# Patient Record
Sex: Male | Born: 1940
Health system: Southern US, Community
[De-identification: ages and names within clinical notes are randomized; demographics above are authoritative.]

## PROBLEM LIST (undated history)

## (undated) DIAGNOSIS — J42 Unspecified chronic bronchitis: Secondary | ICD-10-CM

## (undated) DIAGNOSIS — R51 Headache: Secondary | ICD-10-CM

## (undated) DIAGNOSIS — K219 Gastro-esophageal reflux disease without esophagitis: Secondary | ICD-10-CM

## (undated) DIAGNOSIS — Z950 Presence of cardiac pacemaker: Secondary | ICD-10-CM

## (undated) DIAGNOSIS — Z9289 Personal history of other medical treatment: Secondary | ICD-10-CM

## (undated) DIAGNOSIS — R519 Headache, unspecified: Secondary | ICD-10-CM

## (undated) DIAGNOSIS — E785 Hyperlipidemia, unspecified: Secondary | ICD-10-CM

## (undated) DIAGNOSIS — G4730 Sleep apnea, unspecified: Secondary | ICD-10-CM

## (undated) DIAGNOSIS — M1990 Unspecified osteoarthritis, unspecified site: Secondary | ICD-10-CM

## (undated) DIAGNOSIS — I509 Heart failure, unspecified: Secondary | ICD-10-CM

## (undated) DIAGNOSIS — Z9109 Other allergy status, other than to drugs and biological substances: Secondary | ICD-10-CM

## (undated) DIAGNOSIS — E119 Type 2 diabetes mellitus without complications: Secondary | ICD-10-CM

## (undated) DIAGNOSIS — J189 Pneumonia, unspecified organism: Secondary | ICD-10-CM

## (undated) DIAGNOSIS — I447 Left bundle-branch block, unspecified: Secondary | ICD-10-CM

## (undated) DIAGNOSIS — I219 Acute myocardial infarction, unspecified: Secondary | ICD-10-CM

## (undated) DIAGNOSIS — I10 Essential (primary) hypertension: Secondary | ICD-10-CM

## (undated) DIAGNOSIS — J302 Other seasonal allergic rhinitis: Secondary | ICD-10-CM

## (undated) HISTORY — DX: Other seasonal allergic rhinitis: J30.2

## (undated) HISTORY — DX: Sleep apnea, unspecified: G47.30

## (undated) HISTORY — DX: Hyperlipidemia, unspecified: E78.5

## (undated) HISTORY — DX: Gastro-esophageal reflux disease without esophagitis: K21.9

## (undated) HISTORY — DX: Essential (primary) hypertension: I10

## (undated) HISTORY — PX: BACK SURGERY: SHX140

## (undated) HISTORY — DX: Left bundle-branch block, unspecified: I44.7

---
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
## 1982-12-13 HISTORY — PX: LUMBAR LAMINECTOMY: SHX95

## 1986-12-13 HISTORY — PX: INGUINAL HERNIA REPAIR: SUR1180

## 1997-12-13 DIAGNOSIS — I447 Left bundle-branch block, unspecified: Secondary | ICD-10-CM

## 1997-12-13 HISTORY — DX: Left bundle-branch block, unspecified: I44.7

## 1998-09-18 ENCOUNTER — Ambulatory Visit (HOSPITAL_COMMUNITY): Admission: RE | Admit: 1998-09-18 | Discharge: 1998-09-18 | Payer: Self-pay | Admitting: Interventional Cardiology

## 1998-09-25 ENCOUNTER — Ambulatory Visit (HOSPITAL_COMMUNITY): Admission: RE | Admit: 1998-09-25 | Discharge: 1998-09-25 | Payer: Self-pay | Admitting: Interventional Cardiology

## 2001-07-01 ENCOUNTER — Encounter: Payer: Self-pay | Admitting: Cardiology

## 2001-07-01 ENCOUNTER — Inpatient Hospital Stay (HOSPITAL_COMMUNITY): Admission: EM | Admit: 2001-07-01 | Discharge: 2001-07-03 | Payer: Self-pay | Admitting: Emergency Medicine

## 2001-12-13 HISTORY — PX: CARDIAC CATHETERIZATION: SHX172

## 2004-11-16 ENCOUNTER — Ambulatory Visit: Payer: Self-pay | Admitting: Internal Medicine

## 2004-11-30 ENCOUNTER — Ambulatory Visit: Payer: Self-pay | Admitting: Internal Medicine

## 2005-01-01 ENCOUNTER — Ambulatory Visit (HOSPITAL_COMMUNITY): Admission: RE | Admit: 2005-01-01 | Discharge: 2005-01-01 | Payer: Self-pay | Admitting: Allergy and Immunology

## 2005-02-03 ENCOUNTER — Ambulatory Visit: Payer: Self-pay | Admitting: Family Medicine

## 2005-11-12 ENCOUNTER — Ambulatory Visit: Payer: Self-pay | Admitting: Internal Medicine

## 2006-04-13 ENCOUNTER — Encounter: Payer: Self-pay | Admitting: Interventional Cardiology

## 2007-08-01 ENCOUNTER — Ambulatory Visit (HOSPITAL_COMMUNITY): Admission: RE | Admit: 2007-08-01 | Discharge: 2007-08-01 | Payer: Self-pay | Admitting: Internal Medicine

## 2007-08-01 ENCOUNTER — Telehealth (INDEPENDENT_AMBULATORY_CARE_PROVIDER_SITE_OTHER): Payer: Self-pay | Admitting: *Deleted

## 2007-11-29 ENCOUNTER — Encounter: Payer: Self-pay | Admitting: Internal Medicine

## 2008-04-01 ENCOUNTER — Telehealth (INDEPENDENT_AMBULATORY_CARE_PROVIDER_SITE_OTHER): Payer: Self-pay | Admitting: *Deleted

## 2008-04-02 ENCOUNTER — Emergency Department (HOSPITAL_COMMUNITY): Admission: EM | Admit: 2008-04-02 | Discharge: 2008-04-02 | Payer: Self-pay | Admitting: Family Medicine

## 2008-06-08 ENCOUNTER — Emergency Department (HOSPITAL_COMMUNITY): Admission: EM | Admit: 2008-06-08 | Discharge: 2008-06-08 | Payer: Self-pay | Admitting: Family Medicine

## 2008-07-08 ENCOUNTER — Ambulatory Visit: Payer: Self-pay | Admitting: Cardiovascular Disease

## 2008-07-08 ENCOUNTER — Ambulatory Visit (HOSPITAL_COMMUNITY): Admission: RE | Admit: 2008-07-08 | Discharge: 2008-07-08 | Payer: Self-pay | Admitting: Interventional Cardiology

## 2008-08-01 ENCOUNTER — Encounter: Payer: Self-pay | Admitting: Internal Medicine

## 2008-09-06 ENCOUNTER — Ambulatory Visit: Payer: Self-pay | Admitting: Internal Medicine

## 2008-09-06 DIAGNOSIS — J45909 Unspecified asthma, uncomplicated: Secondary | ICD-10-CM | POA: Insufficient documentation

## 2008-09-06 DIAGNOSIS — I2510 Atherosclerotic heart disease of native coronary artery without angina pectoris: Secondary | ICD-10-CM | POA: Insufficient documentation

## 2008-09-06 DIAGNOSIS — E785 Hyperlipidemia, unspecified: Secondary | ICD-10-CM | POA: Insufficient documentation

## 2008-09-06 DIAGNOSIS — E1169 Type 2 diabetes mellitus with other specified complication: Secondary | ICD-10-CM | POA: Insufficient documentation

## 2008-09-06 LAB — CONVERTED CEMR LAB
Bilirubin Urine: NEGATIVE
Blood in Urine, dipstick: NEGATIVE
Glucose, Urine, Semiquant: NEGATIVE
Ketones, urine, test strip: NEGATIVE
Nitrite: NEGATIVE
Protein, U semiquant: NEGATIVE
Specific Gravity, Urine: <1.005
Urobilinogen, UA: 0.2
WBC Urine, dipstick: NEGATIVE
pH: 6

## 2008-09-13 ENCOUNTER — Encounter: Admission: RE | Admit: 2008-09-13 | Discharge: 2008-09-13 | Payer: Self-pay | Admitting: Internal Medicine

## 2008-09-16 LAB — CONVERTED CEMR LAB
BUN: 19 mg/dL (ref 6–23)
Basophils Absolute: 0 10*3/uL (ref 0.0–0.1)
Basophils Relative: 0.8 % (ref 0.0–3.0)
CO2: 31 meq/L (ref 19–32)
Calcium: 9.4 mg/dL (ref 8.4–10.5)
Chloride: 106 meq/L (ref 96–112)
Creatinine, Ser: 1 mg/dL (ref 0.4–1.5)
Eosinophils Absolute: 0.2 10*3/uL (ref 0.0–0.7)
Eosinophils Relative: 3.5 % (ref 0.0–5.0)
GFR calc Af Amer: 96 mL/min
GFR calc non Af Amer: 80 mL/min
Glucose, Bld: 106 mg/dL — ABNORMAL HIGH (ref 70–99)
HCT: 42.6 % (ref 39.0–52.0)
Hemoglobin: 14.7 g/dL (ref 13.0–17.0)
Hgb A1c MFr Bld: 6.2 % — ABNORMAL HIGH (ref 4.6–6.0)
Lymphocytes Relative: 16.7 % (ref 12.0–46.0)
MCHC: 34.4 g/dL (ref 30.0–36.0)
MCV: 90.5 fL (ref 78.0–100.0)
Monocytes Absolute: 0.5 10*3/uL (ref 0.1–1.0)
Monocytes Relative: 8.6 % (ref 3.0–12.0)
Neutro Abs: 3.9 10*3/uL (ref 1.4–7.7)
Neutrophils Relative %: 70.4 % (ref 43.0–77.0)
PSA: 0.61 ng/mL (ref 0.10–4.00)
Platelets: 285 10*3/uL (ref 150–400)
Potassium: 4.6 meq/L (ref 3.5–5.1)
RBC: 4.71 M/uL (ref 4.22–5.81)
RDW: 12.8 % (ref 11.5–14.6)
Sodium: 141 meq/L (ref 135–145)
TSH: 1 microintl units/mL (ref 0.35–5.50)
WBC: 5.5 10*3/uL (ref 4.5–10.5)

## 2008-09-17 ENCOUNTER — Encounter (INDEPENDENT_AMBULATORY_CARE_PROVIDER_SITE_OTHER): Payer: Self-pay | Admitting: *Deleted

## 2009-01-16 ENCOUNTER — Telehealth: Payer: Self-pay | Admitting: Internal Medicine

## 2009-01-21 ENCOUNTER — Ambulatory Visit: Payer: Self-pay | Admitting: Internal Medicine

## 2009-01-27 ENCOUNTER — Ambulatory Visit: Payer: Self-pay | Admitting: Internal Medicine

## 2009-03-27 ENCOUNTER — Ambulatory Visit: Payer: Self-pay | Admitting: Internal Medicine

## 2009-03-30 LAB — CONVERTED CEMR LAB: Hgb A1c MFr Bld: 6.5 % (ref 4.6–6.5)

## 2009-03-31 ENCOUNTER — Encounter (INDEPENDENT_AMBULATORY_CARE_PROVIDER_SITE_OTHER): Payer: Self-pay | Admitting: *Deleted

## 2009-04-08 ENCOUNTER — Telehealth (INDEPENDENT_AMBULATORY_CARE_PROVIDER_SITE_OTHER): Payer: Self-pay | Admitting: *Deleted

## 2009-04-09 ENCOUNTER — Ambulatory Visit: Payer: Self-pay | Admitting: Internal Medicine

## 2009-04-09 DIAGNOSIS — E1159 Type 2 diabetes mellitus with other circulatory complications: Secondary | ICD-10-CM | POA: Insufficient documentation

## 2009-04-09 DIAGNOSIS — D692 Other nonthrombocytopenic purpura: Secondary | ICD-10-CM | POA: Insufficient documentation

## 2009-04-09 DIAGNOSIS — E1169 Type 2 diabetes mellitus with other specified complication: Secondary | ICD-10-CM | POA: Insufficient documentation

## 2009-04-30 ENCOUNTER — Telehealth (INDEPENDENT_AMBULATORY_CARE_PROVIDER_SITE_OTHER): Payer: Self-pay | Admitting: *Deleted

## 2009-05-01 ENCOUNTER — Ambulatory Visit: Payer: Self-pay | Admitting: Internal Medicine

## 2009-07-07 ENCOUNTER — Ambulatory Visit: Payer: Self-pay | Admitting: Internal Medicine

## 2009-07-07 ENCOUNTER — Telehealth (INDEPENDENT_AMBULATORY_CARE_PROVIDER_SITE_OTHER): Payer: Self-pay | Admitting: *Deleted

## 2009-07-07 LAB — CONVERTED CEMR LAB
Hgb A1c MFr Bld: 6.3 % (ref 4.6–6.5)
LDL Cholesterol: 74 mg/dL (ref 0–99)
VLDL: 14.8 mg/dL (ref 0.0–40.0)

## 2009-07-11 ENCOUNTER — Ambulatory Visit: Payer: Self-pay | Admitting: Internal Medicine

## 2009-10-09 ENCOUNTER — Telehealth: Payer: Self-pay | Admitting: Internal Medicine

## 2009-10-27 ENCOUNTER — Ambulatory Visit: Payer: Self-pay | Admitting: Internal Medicine

## 2009-10-27 DIAGNOSIS — J069 Acute upper respiratory infection, unspecified: Secondary | ICD-10-CM | POA: Insufficient documentation

## 2009-12-16 ENCOUNTER — Telehealth (INDEPENDENT_AMBULATORY_CARE_PROVIDER_SITE_OTHER): Payer: Self-pay | Admitting: *Deleted

## 2010-01-27 ENCOUNTER — Telehealth (INDEPENDENT_AMBULATORY_CARE_PROVIDER_SITE_OTHER): Payer: Self-pay | Admitting: *Deleted

## 2010-03-04 ENCOUNTER — Telehealth (INDEPENDENT_AMBULATORY_CARE_PROVIDER_SITE_OTHER): Payer: Self-pay | Admitting: *Deleted

## 2010-05-20 ENCOUNTER — Telehealth (INDEPENDENT_AMBULATORY_CARE_PROVIDER_SITE_OTHER): Payer: Self-pay | Admitting: *Deleted

## 2010-05-21 ENCOUNTER — Telehealth (INDEPENDENT_AMBULATORY_CARE_PROVIDER_SITE_OTHER): Payer: Self-pay | Admitting: *Deleted

## 2010-05-23 ENCOUNTER — Encounter: Payer: Self-pay | Admitting: Internal Medicine

## 2010-05-26 ENCOUNTER — Encounter: Payer: Self-pay | Admitting: Internal Medicine

## 2010-08-10 ENCOUNTER — Telehealth (INDEPENDENT_AMBULATORY_CARE_PROVIDER_SITE_OTHER): Payer: Self-pay | Admitting: *Deleted

## 2010-08-10 ENCOUNTER — Telehealth: Payer: Self-pay | Admitting: Internal Medicine

## 2010-10-23 ENCOUNTER — Telehealth (INDEPENDENT_AMBULATORY_CARE_PROVIDER_SITE_OTHER): Payer: Self-pay | Admitting: *Deleted

## 2010-11-19 ENCOUNTER — Ambulatory Visit: Payer: Self-pay | Admitting: Internal Medicine

## 2010-11-19 DIAGNOSIS — R21 Rash and other nonspecific skin eruption: Secondary | ICD-10-CM | POA: Insufficient documentation

## 2011-01-14 NOTE — Progress Notes (Signed)
Summary: REFILL  Phone Note Refill Request Message from:  Fax from [facility] on [facility] [address] FAX [phone]  Refills Requested: Medication #1:  ZOLPIDEM TARTRATE 5 MG TABS Take one tablet every 3rd night as needed. Initial call taken by: Barb Merino,  December 16, 2009 10:20 AM    Prescriptions: ZOLPIDEM TARTRATE 5 MG TABS (ZOLPIDEM TARTRATE) Take one tablet every 3rd night as needed.  #30 x 0   Entered by:   Shonna Chock   Authorized by:   Marga Melnick MD   Signed by:   Shonna Chock on 12/16/2009   Method used:   Printed then faxed to ...       [facility]  [phone]* (retail)       [address] [place], [place]  [address]       Ph: [phone] or [phone]       Fax: [phone]   RxID:   [id number]

## 2011-01-14 NOTE — Progress Notes (Signed)
Summary: refill  Phone Note Refill Request Message from:  Fax from Pharmacy on March 04, 2010 9:29 AM  Refills Requested: Medication #1:  ZOLPIDEM TARTRATE 5 MG TABS Take one tablet every 3rd night as needed. [facility] [address] fax [phone]   Method Requested: Fax to Local Pharmacy Next Appointment Scheduled: no appt Initial call taken by: Barb Merino,  March 04, 2010 9:30 AM    Prescriptions: ZOLPIDEM TARTRATE 5 MG TABS (ZOLPIDEM TARTRATE) Take one tablet every 3rd night as needed.  #30 x 0   Entered by:   Shonna Chock   Authorized by:   Marga Melnick MD   Signed by:   Shonna Chock on 03/04/2010   Method used:   Printed then faxed to ...       [facility]  [phone]* (retail)       [address] [place], [place]  [address]       Ph: [phone] or [phone]       Fax: [phone]   RxID:   [id number]

## 2011-01-14 NOTE — Assessment & Plan Note (Signed)
Summary: shingles/kn   Vital Signs:  Patient profile:   70 year old male Height:      69.25 inches (175.90 cm) Weight:      200 pounds (90.91 kg) BMI:     29.43 Temp:     98.1 degrees F (36.72 degrees C) oral Resp:     13 per minute BP sitting:   120 / 70  (left arm)  Vitals Entered By: Lucious Groves CMA (November 19, 2010 11:57 AM) CC: Possible return of shingles./kb, Rash Is Patient Diabetic? No Comments Patient notes that his sacral area is painful and this all began 2 days ago. He has itch, ache, and fatigue. He denies fever and spreading of the rash.   CC:  Possible return of shingles./kb and Rash.  History of Present Illness:     This is a 70 year old man who presents with  malaise & pain in L > R gluteal areas for 2&1/2 days.  The patient reports itching and tenderness, but denies pustules and blisters.  There are no exacerbating or releveing factors.  The patient denies the following symptoms: fever, abdominal pain, nausea, vomiting, diarrhea, dysuria, eye symptoms, and arthralgias.  Rx: none. PMH of  L 4 zoster . Symptoms now are similar . No Zoster immunization to date.  Current Medications (verified): 1)  Lisinopril-Hydrochlorothiazide 20-12.5 Mg Tabs (Lisinopril-Hydrochlorothiazide) .[name].. 1 By Mouth Once Daily 2)  Nexium 40 Mg Pack (Esomeprazole Magnesium) .[name].. 1 By Mouth Once Daily 3)  Crestor 5 Mg Tabs (Rosuvastatin Calcium) .[name].. 1 By Mouth At Bedtime 4)  Singulair 10 Mg Tabs (Montelukast Sodium) .[name].. 1 By Mouth Once Daily 5)  Bystolic 5 Mg Tabs (Nebivolol Hcl) .[name].. 1 By Mouth Once Daily 6)  Allegra 180 Mg Tabs (Fexofenadine Hcl) .[name].. 1 By Mouth Once Daily As Needed 7)  Adult Aspirin Low Strength 81 Mg Tbdp (Aspirin) .[name].. 1 By Mouth At Bedtime 8)  Symbicort 160-4.5 Mcg/act Aero (Budesonide-Formoterol Fumarate) .... 2 Puffs Two Times A Day 9)  Xopenex 1.25 Mg/34ml Nebu (Levalbuterol Hcl) .... Use in Nebulizer As Needed 10)  Coq10 100 Mg Caps (Coenzyme Q10) .... 2 By Mouth  Once Daily 11)  Daily Vitamins  Tabs (Multiple Vitamin) .[name].. 1 By Mouth Once Daily 12)  Proair Hfa 108 (90 Base) Mcg/act Aers (Albuterol Sulfate) .... As Needed 13)  Lorazepam 1 Mg Tabs (Lorazepam) .[name].. 1 By Mouth At Bedtime As Needed**appointment For Additional Refills** 14)  Levitra 20 Mg Tabs (Vardenafil Hcl) .... 1/2 -1 Once Daily As Needed (Can Not Be Taken With Ntg) 15)  Tramadol Hcl 50 Mg Tabs (Tramadol Hcl) .[name].. 1 By Mouth Once Daily As Needed 16)  Onetouch Test  Strp (Glucose Blood) .... Chech Blood Sugar As Directed Pt Checks  Bs 6 Times A Day 17)  Onetouch Lancets  Misc (Lancets) .... Check Bloodsugar As Directed Pt Checks His Bs 6 Times A Day 18)  Azithromycin 250 Mg Tabs (Azithromycin) .... As Per Pack 19)  Prednisone 20 Mg Tabs (Prednisone) .[name].. 1 Two Times A Day 20)  Tamiflu 75 Mg Caps (Oseltamivir Phosphate) .[name].. 1 By Mouth For 10 Days 21)  Nebulizer  Misc (Nebulizers) .... Portable Nebulizer**dx : Asthmatic Bronchitis  Allergies (verified): No Known Drug Allergies  Physical Exam  General:  in no acute distress; alert,appropriate and cooperative throughout examination Rectal:  external  hemorrhoidal tissue . No clinical Pilonidal cyst. Skin:  Faint increase in redness L > R medial gluteal areas w/o cellulitis. LS op scar  well healed Cervical Nodes:  No lymphadenopathy noted Axillary Nodes:  No palpable lymphadenopathy   Impression & Recommendations:  Problem # 1:  RASH-NONVESICULAR (ICD-782.1) S3 zoster clinically  Complete Medication List: 1)  Lisinopril-hydrochlorothiazide 20-12.5 Mg Tabs (Lisinopril-hydrochlorothiazide) .[name].. 1 by mouth once daily 2)  Nexium 40 Mg Pack (Esomeprazole magnesium) .[name].. 1 by mouth once daily 3)  Crestor 5 Mg Tabs (Rosuvastatin calcium) .[name].. 1 by mouth at bedtime 4)  Singulair 10 Mg Tabs (Montelukast sodium) .[name].. 1 by mouth once daily 5)  Bystolic 5 Mg Tabs (Nebivolol hcl) .[name].. 1 by mouth once daily 6)  Allegra 180 Mg Tabs  (Fexofenadine hcl) .[name].. 1 by mouth once daily as needed 7)  Adult Aspirin Low Strength 81 Mg Tbdp (Aspirin) .[name].. 1 by mouth at bedtime 8)  Symbicort 160-4.5 Mcg/act Aero (Budesonide-formoterol fumarate) .... 2 puffs two times a day 9)  Xopenex 1.25 Mg/70ml Nebu (Levalbuterol hcl) .... Use in nebulizer as needed 10)  Coq10 100 Mg Caps (Coenzyme q10) .... 2 by mouth once daily 11)  Daily Vitamins Tabs (Multiple vitamin) .[name].. 1 by mouth once daily 12)  Proair Hfa 108 (90 Base) Mcg/act Aers (Albuterol sulfate) .... As needed 13)  Lorazepam 1 Mg Tabs (Lorazepam) .[name].. 1 by mouth at bedtime as needed**appointment for additional refills** 14)  Levitra 20 Mg Tabs (Vardenafil hcl) .... 1/2 -1 once daily as needed (can not be taken with ntg) 15)  Tramadol Hcl 50 Mg Tabs (Tramadol hcl) .[name].. 1 by mouth once daily as needed 16)  Onetouch Test Strp (Glucose blood) .... Chech blood sugar as directed pt checks  bs 6 times a day 17)  Onetouch Lancets Misc (Lancets) .... Check bloodsugar as directed pt checks his bs 6 times a day 18)  Azithromycin 250 Mg Tabs (Azithromycin) .... As per pack 19)  Prednisone 20 Mg Tabs (Prednisone) .[name].. 1 two times a day 20)  Tamiflu 75 Mg Caps (Oseltamivir phosphate) .[name].. 1 by mouth for 10 days 21)  Nebulizer Misc (Nebulizers) .... Portable nebulizer**dx : asthmatic bronchitis 22)  Famciclovir 500 Mg Tabs (Famciclovir) .[name].. 1 three times a day 23)  Gabapentin 100 Mg Caps (Gabapentin) .[name].. 1 every 8 hrs as needed  Patient Instructions: 1)  titrate Gabapentin 100 mg up to 3 pills every 8 hrs as needed  over 3-4 days Prescriptions: GABAPENTIN 100 MG CAPS (GABAPENTIN) 1 every 8 hrs as needed  #30 x 1   Entered and Authorized by:   Marga Melnick MD   Signed by:   Marga Melnick MD on 11/19/2010   Method used:   Faxed to ...       [facility]* (retail)       [address]       [address] Shipping/mailing       [place], [place]  [address]       Ph:  [phone]       Fax: [phone]   RxID:   [id number] FAMCICLOVIR 500 MG TABS (FAMCICLOVIR) 1 three times a day  #21 x 0   Entered and Authorized by:   Marga Melnick MD   Signed by:   Marga Melnick MD on 11/19/2010   Method used:   Faxed to ...       [facility]* (retail)       [address]       [address] Shipping/mailing       [place], [place]  [address]  Ph: [phone]       Fax: [phone]   RxID:   [id number]    Orders Added: 1)  Est. Patient Level III [62952]

## 2011-01-14 NOTE — Progress Notes (Signed)
Summary: REFILL REQUEST  Phone Note Refill Request Call back at [phone] Message from:  Pharmacy on August 10, 2010 8:40 AM  Refills Requested: Medication #1:  LORAZEPAM 1 MG TABS 1 by mouth at bedtime as needed   Dosage confirmed as above?Dosage Confirmed   Supply Requested: 1 month   Last Ref[facility]: [facility]/09/[facility] [address]  Next Appointment Scheduled: NONE Initial call taken by: Lavell Islam,  August 10, 2010 8:40 AM    Prescriptions: LORAZEPAM 1 MG TABS (LORAZEPAM) 1 by mouth at bedtime as needed  #30 x 0   Entered by:   Shonna Chock CMA   Authorized by:   Loreen Freud DO   Signed by:   Shonna Chock CMA on 08/10/2010   Method used:   Printed then faxed to ...       [facility]  [phone]* (retail)       [address], [place]  [address]       Ph: [phone] or [phone]       Fax: [phone]   RxID:   [id number]

## 2011-01-14 NOTE — Progress Notes (Signed)
Summary: refill  Phone Note Refill Request Message from:  Fax from Pharmacy on October 23, 2010 8:52 AM  Refills Requested: Medication #1:  LORAZEPAM 1 MG TABS 1 by mouth at bedtime as needed [facility] [phone]  Initial call taken by: Okey Regal Spring,  October 23, 2010 9:15 AM    New/Updated Medications: LORAZEPAM 1 MG TABS (LORAZEPAM) 1 by mouth at bedtime as needed**APPOINTMENT FOR ADDITIONAL REFILLS** Prescriptions: LORAZEPAM 1 MG TABS (LORAZEPAM) 1 by mouth at bedtime as needed**APPOINTMENT FOR ADDITIONAL REFILLS**  #30 x 0   Entered by:   Shonna Chock CMA   Authorized by:   Marga Melnick MD   Signed by:   Shonna Chock CMA on 10/23/2010   Method used:   Printed then faxed to ...       [facility]  [phone]* (retail)       [address] [place], [place]  [address]       Ph: [phone] or [phone]       Fax: [phone]   RxID:   [id number]

## 2011-01-14 NOTE — Medication Information (Signed)
Summary: Prior Authorization & Denial for Zolpidem Tartrate/[facility]  Prior Authorization & Denial for Zolpidem Tartrate/[facility]   Imported By: Lanelle Bal 06/12/2010 15:33:42  _____________________________________________________________________  External Attachment:    Type:   Image     Comment:   External Document

## 2011-01-14 NOTE — Progress Notes (Signed)
Summary: rx for portable nebulizer  Phone Note Call from Patient Call back at Home Phone [phone]   Caller: Patient Summary of Call: patient wants rx for portable nebulizer -faxed to [facility] - fax [phone] -he has a nebulizer but it is heavy for travel   Initial call taken by: Okey Regal Spring,  August 10, 2010 1:51 PM  Follow-up for Phone Call        Dr.Hopper please advise on instruction for neblizer use Follow-up by: Shonna Chock CMA,  August 10, 2010 2:02 PM  Additional Follow-up for Phone Call Additional follow up Details #1::        OK ;Dx : asthmatic bronchitis. Rx meds if needed for 90 days , RX1 Additional Follow-up by: Marga Melnick MD,  August 10, 2010 4:14 PM    Additional Follow-up for Phone Call Additional follow up Details #2::    pt aware rx faxed [phone], pt states that he does not currently need any med now............Marland KitchenFelecia Deloach CMA  August 10, 2010 4:33 PM   New/Updated Medications: NEBULIZER  MISC (NEBULIZERS) Portable Nebulizer NEBULIZER  MISC (NEBULIZERS) Portable Nebulizer**Dx : asthmatic bronchitis Prescriptions: NEBULIZER  MISC (NEBULIZERS) Portable Nebulizer**Dx : asthmatic bronchitis  #1 x 0   Entered by:   Jeremy Johann CMA   Authorized by:   Marga Melnick MD   Signed by:   Jeremy Johann CMA on 08/10/2010   Method used:   Printed then faxed to ...       [facility]  [phone]* (retail)       [address] [place], [place]  [address]       Ph: [phone] or [phone]       Fax: [phone]   RxID:   [id number]

## 2011-01-14 NOTE — Progress Notes (Signed)
Summary: Speak with MD about flu exposure  Phone Note Call from Patient Call back at Home Phone [phone]   Caller: Patient Reason for Call: Talk to Doctor Summary of Call: Patinet has been expose to the flu this past weekend and wants to know what preventative measures he should take. He did not want to speak to the nurse, just to you.  Initial call taken by: Harold Barban,  January 27, 2010 1:29 PM  Follow-up for Phone Call        Tamiflu is only option if exposure witin 48 hrs ; I can Rx if he wishes (75 mg qdX10 days). Only other options would be Zicam, Echinacea, vit C 200 mg once daily & strict handwashing Follow-up by: Marga Melnick MD,  January 27, 2010 2:04 PM  Additional Follow-up for Phone Call Additional follow up Details #1::        Spoke with pt, given Dr Caryl Never recommendation re flu, pt has not been exposed yet but will be on friday, pt would like rx if pt is not still contagious will not use, [facility] .Kandice Hams  January 27, 2010 2:34 PM  Additional Follow-up by: Kandice Hams,  January 27, 2010 2:35 PM    New/Updated Medications: TAMIFLU 75 MG CAPS (OSELTAMIVIR PHOSPHATE) 1 by mouth for 10 days Prescriptions: TAMIFLU 75 MG CAPS (OSELTAMIVIR PHOSPHATE) 1 by mouth for 10 days  #10 x 0   Entered by:   Kandice Hams   Authorized by:   Marga Melnick MD   Signed by:   Kandice Hams on 01/27/2010   Method used:   Faxed to ...       [facility]  [phone]* (retail)       [address] [place], [place]  [address]       Ph: [phone] or [phone]       Fax: [phone]   RxID:   [id number]

## 2011-01-14 NOTE — Progress Notes (Signed)
Summary: Prior Auth in process ZOLPIDEM//[facility]  Phone Note Refill Request Message from:  Pharmacy on May 20, 2010 12:30 PM  Refills Requested: Medication #1:  ZOLPIDEM TARTRATE 5 MG TABS Take one tablet every 3rd night as needed.   Dosage confirmed as above?Dosage Confirmed [facility] [phone] ID#: [id number]  Initial call taken by: Harold Barban,  May 20, 2010 12:30 PM  Follow-up for Phone Call        PRIOR AUTH IN PROCESS ZOLPIDEM  CASE QI#[id number] .Kandice Hams  May 21, 2010 4:20 PM  Follow-up by: Kandice Hams,  May 21, 2010 4:20 PM  Additional Follow-up for Phone Call Additional follow up Details #1::        zolpidem denied stating that medication will be covered if being treated for chronic persistent insomnia and require medication nightly based on information patient doesn't meet criteria.....Marland KitchenMarland KitchenDoristine Devoid  May 29, 2010 4:52 PM   Hop do you wish to submitt appeal if so I have paperwork to be completed and faxed back .....Marland KitchenMarland KitchenDoristine Devoid  May 29, 2010 4:54 PM     Additional Follow-up for Phone Call Additional follow up Details #2::    this makes no sense; it ia "Catch 22".  Ask him if he wants to use Lorazepam 1 mg at bedtime as needed . I can Rx #30 as needed as directed  Follow-up by: Marga Melnick MD,  May 29, 2010 5:10 PM  Additional Follow-up for Phone Call Additional follow up Details #3:: Details for Additional Follow-up Action Taken: I spoke with patient and patient is ok with trying Lorazepam and would like [facility]. Additional Follow-up by: Shonna Chock,  June 09, 2010 8:18 AM  New/Updated Medications: LORAZEPAM 1 MG TABS (LORAZEPAM) 1 by mouth at bedtime as needed Prescriptions: LORAZEPAM 1 MG TABS (LORAZEPAM) 1 by mouth at bedtime as needed  #30 x 0   Entered by:   Shonna Chock   Authorized by:   Marga Melnick MD   Signed by:   Shonna Chock on 06/09/2010   Method used:   Printed then faxed to  ...       [facility]  [phone]* (retail)       [address] [place], [place]  [address]       Ph: [phone] or [phone]       Fax: [phone]   RxID:   [id number]

## 2011-01-14 NOTE — Progress Notes (Signed)
Summary: NEED DX FOR PRIO AUTHR.Marland KitchenHOP SEE PLEASE  Phone Note From Other Clinic   Caller: [facility] Summary of Call: HOP THIS PT NEEDS PRIOR AUTH FOR HIS ZOLPIDEM,  WHAT IS PT DIAGNOSIS LOOKED IN PROB LIST NO MENTION OF SX--PLEASE ADVISE Initial call taken by: Kandice Hams,  May 21, 2010 4:52 PM  Follow-up for Phone Call        Insomnia due to Shift work Follow-up by: Marga Melnick MD,  May 21, 2010 5:52 PM

## 2011-01-18 ENCOUNTER — Telehealth (INDEPENDENT_AMBULATORY_CARE_PROVIDER_SITE_OTHER): Payer: Self-pay | Admitting: *Deleted

## 2011-01-28 NOTE — Progress Notes (Signed)
Summary: Refill Request  Phone Note Refill Request Call back at [phone] Message from:  Pharmacy on January 18, 2011 9:01 AM  Refills Requested: Medication #1:  LORAZEPAM 1 MG TABS 1 by mouth at bedtime as needed**APPOINTMENT FOR ADDITIONAL REFILLS**   Dosage confirmed as above?Dosage Confirmed   Supply Requested: 30   [facility] [address]  Next Appointment Scheduled: none Initial call taken by: Harold Barban,  January 18, 2011 9:02 AM    New/Updated Medications: LORAZEPAM 1 MG TABS (LORAZEPAM) 1 by mouth at bedtime as needed Prescriptions: LORAZEPAM 1 MG TABS (LORAZEPAM) 1 by mouth at bedtime as needed  #30 x 0   Entered by:   Shonna Chock CMA   Authorized by:   Marga Melnick MD   Signed by:   Shonna Chock CMA on 01/18/2011   Method used:   Printed then faxed to ...       [facility]  [phone]* (retail)       [address] [place], [place]  [address]       Ph: [phone] or [phone]       Fax: [phone]   RxID:   [id number]

## 2011-02-01 ENCOUNTER — Telehealth (INDEPENDENT_AMBULATORY_CARE_PROVIDER_SITE_OTHER): Payer: Self-pay | Admitting: *Deleted

## 2011-02-09 NOTE — Progress Notes (Signed)
Summary: refill  Phone Note Refill Request Message from:  Fax from Pharmacy on February 01, 2011 2:13 PM  Refills Requested: Medication #1:  LEVITRA 20 MG TABS 1/2 -1 once daily as needed (CAN NOT BE TAKEN WITH NTG) [facility] [phone]   Initial call taken by: Okey Regal Spring,  February 01, 2011 2:14 PM  Follow-up for Phone Call        Last OV was an acute, previous appointment was 2010.  Patient needs to schedule CPX appointment  Follow-up by: Shonna Chock CMA,  February 01, 2011 2:47 PM    New/Updated Medications: LEVITRA 20 MG TABS (VARDENAFIL HCL) 1/2 -1 once daily as needed (CAN NOT BE TAKEN WITH NTG) **APPOINTMENT DUE** Prescriptions: LEVITRA 20 MG TABS (VARDENAFIL HCL) 1/2 -1 once daily as needed (CAN NOT BE TAKEN WITH NTG) **APPOINTMENT DUE**  #6 x 0   Entered by:   Shonna Chock CMA   Authorized by:   Marga Melnick MD   Signed by:   Shonna Chock CMA on 02/01/2011   Method used:   Electronically to        [facility]  [phone]* (retail)       [address] [place], [place]  [address]       Ph: [phone] or [phone]       Fax: [phone]   RxID:   [id number]

## 2011-03-15 ENCOUNTER — Encounter: Payer: Self-pay | Admitting: Family Medicine

## 2011-03-15 ENCOUNTER — Ambulatory Visit (INDEPENDENT_AMBULATORY_CARE_PROVIDER_SITE_OTHER): Payer: 59 | Admitting: Family Medicine

## 2011-03-15 DIAGNOSIS — J069 Acute upper respiratory infection, unspecified: Secondary | ICD-10-CM

## 2011-03-15 DIAGNOSIS — J45909 Unspecified asthma, uncomplicated: Secondary | ICD-10-CM

## 2011-03-15 MED ORDER — PREDNISONE 20 MG PO TABS: 20.0000 mg | ORAL_TABLET | Freq: Two times a day (BID) | ORAL | Status: DC

## 2011-03-15 MED ORDER — CHLORPHENIRAMINE-HYDROCODONE 8-10 MG/5ML PO LQCR: 5.0000 mL | Freq: Two times a day (BID) | ORAL | Status: AC | PRN

## 2011-03-15 MED ORDER — MOXIFLOXACIN HCL 400 MG PO TABS: 400.0000 mg | ORAL_TABLET | Freq: Every day | ORAL | Status: AC

## 2011-03-15 NOTE — Progress Notes (Signed)
  Subjective:    Patient ID: Jeffery [name], male    DOB: Jun 21, 1941, 70 y.o.   MRN: [id number]  HPI Cough- sxs started 4-5 days ago w/ 'classic allergy rhinitis' and 'went straight to the lungs'.  Hx of asthma.  Cough is productive of 'yellow green'.  + fevers.  + sick contacts.  Using Xopenex, Singulair, Symbicort.  Denies ear pain, sore throat, facial pain, nasal congestion.  Works as Designer, industrial/product at [employer]    Review of Systems For ROS see HPI     Objective:   Physical Exam  Constitutional: He appears well-developed and well-nourished. No distress.  HENT:  Head: Normocephalic and atraumatic.  Right Ear: Tympanic membrane normal.  Left Ear: Tympanic membrane normal.  Nose: No mucosal edema or rhinorrhea. Right sinus exhibits no maxillary sinus tenderness and no frontal sinus tenderness. Left sinus exhibits no maxillary sinus tenderness and no frontal sinus tenderness.  Mouth/Throat: No oropharyngeal exudate, posterior oropharyngeal edema or posterior oropharyngeal erythema.  Eyes: Conjunctivae and EOM are normal. Pupils are equal, round, and reactive to light.  Neck: Normal range of motion. Neck supple.  Cardiovascular: Normal rate, regular rhythm and normal heart sounds.   Pulmonary/Chest: He has wheezes.       Strong, hacking cough Severe, diffuse wheezing w/ prolonged expiratory phase Coarse BS RLL  Lymphadenopathy:    He has no cervical adenopathy.          Assessment & Plan:

## 2011-03-15 NOTE — Patient Instructions (Signed)
Take the Avelox daily as directed for the bronchitis- take w/ food to avoid upset stomach Use the Albuterol or Xopenex as needed for cough/wheezing Start the Prednisone- 2 pills daily (at same time, not 2x/day).  Take w/ food. Tylenol for fever Drink plenty of fluids Tussionex as needed for cough Call with any questions or concerns Hang in there!!!

## 2011-03-16 NOTE — Assessment & Plan Note (Signed)
URI causing exacerbation.  Improved air movement w/ neb in the office.  Start steroids to improve oxygenation.  Continue inhaler use.  Reviewed supportive care and red flags that should prompt return.  Pt expressed understanding and is in agreement w/ plan.

## 2011-03-16 NOTE — Assessment & Plan Note (Signed)
Pt w/ obvious infxn- given hx of lung dz and exposure to sick contacts will start Avelox.  Cough meds prn.  Reviewed supportive care and red flags that should prompt return.  Pt expressed understanding and is in agreement w/ plan.

## 2011-04-21 ENCOUNTER — Other Ambulatory Visit: Payer: Self-pay | Admitting: *Deleted

## 2011-04-21 MED ORDER — LORAZEPAM 1 MG PO TABS: 1.0000 mg | ORAL_TABLET | Freq: Every evening | ORAL | Status: DC | PRN

## 2011-04-27 NOTE — Assessment & Plan Note (Signed)
[facility]                                 ON-CALL NOTE   NAME:Mimnaugh, Delshawn                     MRN:          [id number]  DATE:01/21/2009                            DOB:          08-23-1941    PHONE NUMBER:  [phone].   PRIMARY CARE PHYSICIAN:  Titus Dubin. Alwyn Ren, MD,FACP, FCCP   SUBJECTIVE:  The patient calls regarding an asthma exacerbation that  began earlier in the week.  He has been seen by his primary care who  recommended an extended course of prednisone as well as having treated  him with a course of azithromycin.  He states that his wheezing and  shortness of breath is much improved, but he does continue to have a  cough.  He denies fever and has not had any change in color of sputum.  He is calling because he was told that if he was not any better to start  Avelox, but he is not sure if he  should or if he should hold off.   ASSESSMENT AND PLAN:  Asthma exacerbation.  I feel that he likely has  been adequately treated with azithromycin, given no fever, as well as no  change in his sputum and improvement in symptoms at this point.  I  counseled him to hold off on using Avelox and consider followup with his  regular doctor.  If he feels that he is worsening in the next 24 hours,  he does have Avelox that he may start.     Kerby Nora, MD  Electronically Signed    AB/[facility]  DD: 01/26/2009  DT: 01/27/2009  Job #: [id number]

## 2011-04-28 ENCOUNTER — Other Ambulatory Visit: Payer: Self-pay | Admitting: *Deleted

## 2011-04-28 MED ORDER — VARDENAFIL HCL 20 MG PO TABS: 20.0000 mg | ORAL_TABLET | ORAL | Status: DC

## 2011-04-28 NOTE — Telephone Encounter (Signed)
Patient needs to schedule a CPX  

## 2011-04-30 NOTE — Cardiovascular Report (Signed)
[place]  Patient:    Jeffery [name], Jeffery [name]                     MRN: [id number] Proc. Date: 07/03/01 Adm. Date:  [id number] Attending:  Lyn Records. 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  He has a history of chronic left bundle-branch block and previous functional studies that have at times suggested a low EF in the upper 40s.  PROCEDURES PERFORMED: 1. Left heart catheterization. 2. Selective coronary angiography. 3. Left ventriculography.  DESCRIPTION OF PROCEDURE:  After informed consent, a 6 French sheath was placed via the right femoral artery using a modified Seldinger technique. A 6 French A2 multipurpose catheter was used for hemodynamic recordings, left ventriculography and selective left coronary angiography.  A 6 French #4 right Judkins catheter was used for right coronary angiography.  The patient tolerated the procedure without complications.  RESULTS:  I:  HEMODYNAMIC DATA:     a. The aortic pressure 139/83 mmHg.     b. Left ventricular pressure 139/22 mmHg.  II:  LEFT VENTRICULOGRAPHY:  The left ventricular cavity size is mildly dilated.  Overall contractility is normal.  The estimated ejection fraction is 60% and the calculated ejection fraction is 49%.  No mitral regurgitation is noted.  III:  SELECTIVE CORONARY ANGIOGRAPHY:     a. Left main:  The left main coronary artery has no calcification.        The left main is short and is normal.     b. Left anterior descending coronary artery:  The LAD is large.  In the        initial RAO view there is evidence of an ostial 20% LAD narrowing.        Several small diagonal branches arise from this vessel.  No significant        obstruction is noted in the LAD or its branches.     c. Circumflex artery:  The circumflex coronary artery is moderate in size        and  gives two obtuse marginals and the first obtuse marginal contains        an eccentric 50-70% stenosis just distal to the bifurcation and second        obtuse marginal contains an eccentric 30% stenosis just beyond the        bifurcation from the circumflex.     d. Right coronary artery:  The right coronary artery is a dominant vessel        containing mid vessel luminal irregularities but no significant        obstruction.  CONCLUSIONS: 1. Mild to moderate coronary disease with predominant involvement in the    first and second obtuse marginals as noted above. 2. Overall normal left ventricular function. 3. Chronic left bundle-branch block.  RECOMMENDATIONS:  Aggressive antilipid therapy and medical therapy. There is no current indication for percutaneous intervention.  I will also recommend an aspirin per day. DD:  07/03/01 TD:  07/03/01 Job: [id number]

## 2011-10-18 ENCOUNTER — Other Ambulatory Visit: Payer: Self-pay | Admitting: Internal Medicine

## 2011-10-18 MED ORDER — LORAZEPAM 1 MG PO TABS: 1.0000 mg | ORAL_TABLET | Freq: Every evening | ORAL | Status: DC | PRN

## 2011-10-18 NOTE — Telephone Encounter (Signed)
Hop's patient please advise     KPP

## 2011-10-18 NOTE — Telephone Encounter (Signed)
Patient needs to schedule a CPX  

## 2011-12-08 ENCOUNTER — Other Ambulatory Visit: Payer: Self-pay | Admitting: Internal Medicine

## 2011-12-08 MED ORDER — VARDENAFIL HCL 20 MG PO TABS: 20.0000 mg | ORAL_TABLET | ORAL | Status: DC

## 2011-12-08 NOTE — Telephone Encounter (Signed)
RX sent, patient needs to schedule a yearly appointment (CPX) with Dr.Hopper

## 2011-12-10 ENCOUNTER — Ambulatory Visit (INDEPENDENT_AMBULATORY_CARE_PROVIDER_SITE_OTHER): Payer: 59

## 2011-12-10 DIAGNOSIS — R059 Cough, unspecified: Secondary | ICD-10-CM

## 2011-12-10 DIAGNOSIS — B9789 Other viral agents as the cause of diseases classified elsewhere: Secondary | ICD-10-CM

## 2011-12-10 DIAGNOSIS — R05 Cough: Secondary | ICD-10-CM

## 2011-12-25 ENCOUNTER — Ambulatory Visit (INDEPENDENT_AMBULATORY_CARE_PROVIDER_SITE_OTHER): Payer: 59

## 2011-12-25 DIAGNOSIS — R059 Cough, unspecified: Secondary | ICD-10-CM

## 2011-12-25 DIAGNOSIS — R05 Cough: Secondary | ICD-10-CM

## 2012-02-01 ENCOUNTER — Telehealth: Payer: Self-pay | Admitting: Internal Medicine

## 2012-02-01 MED ORDER — LORAZEPAM 1 MG PO TABS: 1.0000 mg | ORAL_TABLET | Freq: Every evening | ORAL | Status: DC | PRN

## 2012-02-01 NOTE — Telephone Encounter (Signed)
Last OV 03/15/11, pending appointment 03/27/12 RX sent

## 2012-02-01 NOTE — Telephone Encounter (Signed)
Refill- lorazepam 1mg  tablet. Take 1 tablet at bedtime as needed.  Pharmacy comments: needs office visit.

## 2012-02-09 ENCOUNTER — Ambulatory Visit (INDEPENDENT_AMBULATORY_CARE_PROVIDER_SITE_OTHER): Payer: 59 | Admitting: Internal Medicine

## 2012-02-09 ENCOUNTER — Encounter: Payer: Self-pay | Admitting: Internal Medicine

## 2012-02-09 DIAGNOSIS — J209 Acute bronchitis, unspecified: Secondary | ICD-10-CM

## 2012-02-09 DIAGNOSIS — J45909 Unspecified asthma, uncomplicated: Secondary | ICD-10-CM

## 2012-02-09 MED ORDER — AZITHROMYCIN 250 MG PO TABS: ORAL_TABLET | ORAL | Status: AC

## 2012-02-09 MED ORDER — PREDNISONE 20 MG PO TABS: 20.0000 mg | ORAL_TABLET | Freq: Two times a day (BID) | ORAL | Status: DC

## 2012-02-09 MED ORDER — VARDENAFIL HCL 20 MG PO TABS: 20.0000 mg | ORAL_TABLET | ORAL | Status: DC

## 2012-02-09 NOTE — Progress Notes (Signed)
  Subjective:    Patient ID: Jeffery [name], male    DOB: [birth year], 71 y.o.   MRN: [id number]  HPI Respiratory tract infection Onset/symptoms:02/06/12 as mild ST Exposures (illness/environmental/extrinsic):no Progression of symptoms:to chest congestion Treatments/response:fluids, rest/ no response Present symptoms: Fever/chills/sweats:no Frontal headache:no Facial pain:no Nasal purulence:no, clear Sore throat:resolved Dental pain:no Lymphadenopathy:no Wheezing/shortness of breath:some wheezing, Xoponex helped significantly. He uses albuterol simply as a rescue. He is on Symbicort 2 puffs twice a day  & Singulair Cough/sputum/hemoptysis:productive as of 2/26 Pleuritic pain:no Associated extrinsic/allergic symptoms:itchy eyes/ sneezing:yes initially Past medical history: Seasonal allergiesyes : /asthma:yes from age 75 Smoking history:quit 1965          Review of Systems Refill needed for Levitra; he knows not to take this medication with nitroglycerin. He states that the nitroglycerin is quite as bad before he uses any of it.     Objective:   Physical Exam Gen.: Healthy and well-nourished in appearance. Alert, appropriate and cooperative throughout exam. Eyes: No corneal or conjunctival inflammation noted.  Ears: External  ear exam reveals no significant lesions or deformities. Canals clear .TMs normal. Hearing is grossly normal bilaterally. Nose: External nasal exam reveals no deformity or inflammation. Nasal mucosa are pink and moist. No lesions or exudates noted. Septum dislocated to R Mouth: Oral mucosa and oropharynx reveal no lesions or exudates. Teeth in good repair. Lungs: Normal respiratory effort; chest expands symmetrically. He has diffuse, homogenous, low-grade wheezing in all fields without increased work of breathing. Dry raspy cough Heart: Normal rate and rhythm. Normal S1 and S2. No gallop, click, or rub. S4 with slurring at  LSB ; no murmur. Heart sounds are  distant and heard best in the epigastric/ lower sternal area .                                                            Musculoskeletal/extremities: No clubbing, cyanosis, edema, or deformity noted.  Nail health  good. Neurologic: Alert and oriented x3.  Skin: Intact without suspicious lesions or rashes. Lymph: No cervical, axillary lymphadenopathy present. Psych: Mood and affect are normal. Normally interactive                                                                                         Assessment & Plan:  #1 upper respiratory infection without rhinositis  # bronchitis , acute with active  Bronchospasm despite maintenance Singulair and Symbicort and when necessary Xopenex  Plan: See orders and recommendations

## 2012-02-09 NOTE — Patient Instructions (Signed)
Plain Mucinex for thick secretions ;force NON dairy fluids . Use a Neti pot daily as needed for sinus congestion. Nasal cleansing in the shower as discussed. Make sure that all residual soap is removed to prevent irritation.  

## 2012-02-10 ENCOUNTER — Telehealth: Payer: Self-pay | Admitting: *Deleted

## 2012-02-10 MED ORDER — GUAIFENESIN-CODEINE 100-10 MG/5ML PO SYRP: ORAL_SOLUTION | ORAL | Status: DC

## 2012-02-10 NOTE — Telephone Encounter (Signed)
Ok for Cheratussin 1-2 tsps Q6 prn for cough.  #240, no refills.

## 2012-02-10 NOTE — Telephone Encounter (Signed)
[facility] Triage Call Report Triage Record Num: [id number] Operator: Jeraldine Loots Patient Name: Jeffery [name] Call Date & Time: 02/10/2012 9:58:18AM Patient Phone: [phone] PCP: Marga Melnick Patient Gender: Male PCP Fax : [phone] Patient DOB: [birth year] Practice Name: [facility] Reason for Call: Caller: Jeffery [name]/Patient; PCP: Marga Melnick; CB#: [phone]; Was seen in the office on Wednesday but forgot to ask for a cough medication. States that he was up most of the night coughing. Using the new meds and his asthma meds. Uses [facility] on [address]. Protocol(s) Used: Office Note Recommended Outcome per Protocol: Information Noted and Sent to Office Reason for Outcome: Caller information to office Care Advice:

## 2012-02-10 NOTE — Telephone Encounter (Signed)
Please advise if ok to RX cough med for patient, last OV 02/09/2012 rx'ed z-pak and prednisone. NKDA

## 2012-02-29 ENCOUNTER — Ambulatory Visit (INDEPENDENT_AMBULATORY_CARE_PROVIDER_SITE_OTHER): Payer: 59 | Admitting: Internal Medicine

## 2012-02-29 ENCOUNTER — Encounter: Payer: Self-pay | Admitting: Internal Medicine

## 2012-02-29 VITALS — BP 114/76 | HR 78 | Temp 98.5°F | Wt 208.0 lb

## 2012-02-29 DIAGNOSIS — J45901 Unspecified asthma with (acute) exacerbation: Secondary | ICD-10-CM

## 2012-02-29 MED ORDER — BECLOMETHASONE DIPROPIONATE 80 MCG/ACT IN AERS: 1.0000 | INHALATION_SPRAY | Freq: Two times a day (BID) | RESPIRATORY_TRACT | Status: DC

## 2012-02-29 MED ORDER — PREDNISONE 20 MG PO TABS: 20.0000 mg | ORAL_TABLET | Freq: Two times a day (BID) | ORAL | Status: DC

## 2012-02-29 NOTE — Patient Instructions (Signed)
QVAR one - 2 inhalations every 12 hours; gargle and spit after use .Order for x-rays entered into  the computer; these will be performed at [address] [facility]. across from [facility]. No appointment is necessary.

## 2012-02-29 NOTE — Progress Notes (Signed)
  Subjective:    Patient ID: Jeffery [name], male    DOB: 04-28-1941, 71 y.o.   MRN: [id number]  HPI He did have protracted symptoms after last office visit despite the prednisone and antibiotics. Since that time he has had variable cough and wheezing, but he does feel significantly better today. Symptoms had been worse at night despite using his Symbicort 2 puffs twice a day & also  Allegra and Singulair daily. He's not been using his rescue inhaler.  He denies fever, chills, sweats, or purulent secretions. He has been producing plugs or airway casts intermittently. He denies extrinsic symptoms of watery itchy eyes and sneezing    Review of Systems   He also denies postnasal drainage. He denies any significant dyspepsia or reflux symptoms. He is on Bystolic  as well as an ACE inhibitor but he does not relate his symptomatology to these medicines.     Objective:   Physical Exam General appearance:good health ;well nourished; no acute distress or increased work of breathing is present.  No  lymphadenopathy about the head, neck, or axilla noted.   Eyes: No conjunctival inflammation or lid edema is present.   Ears:  External ear exam shows no significant lesions or deformities.  Otoscopic examination reveals clear canals, tympanic membranes are intact bilaterally without bulging, retraction, inflammation or discharge.  Nose:  External nasal examination shows no deformity or inflammation. Nasal mucosa are pink and moist without lesions or exudates. No septal dislocation or deviation.No obstruction to airflow.   Oral exam: Dental hygiene is good; lips and gums are healthy appearing.There is no oropharyngeal erythema or exudate noted.     Heart:  Normal rate and regular rhythm. S1 and S2 normal without gallop, murmur, click, rub or other extra sounds.   Lungs: Diffuse, low grade musical rhonchi and wheezing in all lung fields. Auscultatory changes are most prominent over the anterior chest.No  increased work of breathing.    Extremities:  No cyanosis, edema, or clubbing  noted    Skin: Warm & dry        Assessment & Plan:  #1 chronic, active asthma without apparent infectious or extrinsic triggers.  Plan: The albuterol should be used every 4 hours as needed. Supplemental inhaled steroids in addition to the Symbicort and a steroid wean would be recommended. A chest x-ray is necessary to rule out atypical infection.

## 2012-03-02 ENCOUNTER — Ambulatory Visit (INDEPENDENT_AMBULATORY_CARE_PROVIDER_SITE_OTHER)
Admission: RE | Admit: 2012-03-02 | Discharge: 2012-03-02 | Disposition: A | Discharge status: Home / Self Care | Payer: 59 | Source: Ambulatory Visit | Attending: Internal Medicine | Admitting: Internal Medicine

## 2012-03-02 DIAGNOSIS — J45901 Unspecified asthma with (acute) exacerbation: Secondary | ICD-10-CM

## 2012-03-07 ENCOUNTER — Telehealth: Payer: Self-pay | Admitting: *Deleted

## 2012-03-07 NOTE — Telephone Encounter (Signed)
Pt would like to know if it would be possible for Dr Alwyn Ren to take over filling all his med including the ones that cardiologist usually fill. Pt notes that he would like to reduce the amount of physician he is seeing.Please advise

## 2012-03-07 NOTE — Telephone Encounter (Signed)
Left message to call office

## 2012-03-07 NOTE — Telephone Encounter (Signed)
This is fine as long as the medications do not include agents such as Plavix, Pradaxa, Xarelto,etc ( "blood thinners")

## 2012-03-21 NOTE — Telephone Encounter (Signed)
Discuss with patient  

## 2012-03-27 ENCOUNTER — Ambulatory Visit (INDEPENDENT_AMBULATORY_CARE_PROVIDER_SITE_OTHER): Payer: 59 | Admitting: Internal Medicine

## 2012-03-27 ENCOUNTER — Encounter: Payer: Self-pay | Admitting: Internal Medicine

## 2012-03-27 VITALS — BP 114/70 | HR 80 | Temp 98.3°F | Resp 14 | Ht 69.03 in | Wt 207.8 lb

## 2012-03-27 DIAGNOSIS — J45909 Unspecified asthma, uncomplicated: Secondary | ICD-10-CM

## 2012-03-27 DIAGNOSIS — E119 Type 2 diabetes mellitus without complications: Secondary | ICD-10-CM

## 2012-03-27 DIAGNOSIS — J398 Other specified diseases of upper respiratory tract: Secondary | ICD-10-CM

## 2012-03-27 DIAGNOSIS — J988 Other specified respiratory disorders: Secondary | ICD-10-CM

## 2012-03-27 MED ORDER — LORAZEPAM 1 MG PO TABS: 1.0000 mg | ORAL_TABLET | Freq: Every evening | ORAL | Status: DC | PRN

## 2012-03-27 MED ORDER — HYDROCODONE-ACETAMINOPHEN 7.5-500 MG PO TABS: 1.0000 | ORAL_TABLET | ORAL | Status: AC | PRN

## 2012-03-27 NOTE — Progress Notes (Signed)
Pt could not use the bathroom so took urine cup home.[name] He will drop it off one day this week.

## 2012-03-27 NOTE — Progress Notes (Signed)
Addended byPecola Lawless on: 03/27/2012 03:49 PM   Modules accepted: Orders

## 2012-03-27 NOTE — Progress Notes (Signed)
Subjective:    Patient ID: Jeffery [name], male    DOB: 09-Sep-1941, 71 y.o.   MRN: [id number]  HPI Jeffery [name]  is here for a physical;acute issues include LBP       Review of Systems   He's had variable character/ intensity left lumbosacral area pain which exacerabted over 10 days with his recent respiratory tract infection with protracted coughing. Flexeril was not significantly helpful but narcotic pain medications were.Rarely this pain radiates to the right hip. It has been  worse with activities such as yard work.This is actually an acute on chronic problem over the years. He has had no associated dysuria, pyuria, or hematuria. He denies unexplained weight loss, abdominal pain, melena, or rectal bleeding. He has not had a colonoscopy; he states it has been put off as he would require a stress test prior to completion of the study. Standard of  care discussed    Objective:   Physical Exam Gen.: Healthy and well-nourished in appearance. Alert, appropriate and cooperative throughout exam. Head: Normocephalic without obvious abnormalities;  no alopecia  Eyes: No corneal or conjunctival inflammation noted. Pupils equal round reactive to light and accommodation. Fundal exam is benign without hemorrhages, exudate, papilledema. Extraocular motion intact. Vision grossly normal with lenses. Ears: External  ear exam reveals no significant lesions or deformities. Canals clear .TMs normal. Hearing is grossly normal bilaterally. Nose: External nasal exam reveals no deformity or inflammation. Nasal mucosa are pink and moist. No lesions or exudates noted.  Mouth: Oral mucosa and oropharynx reveal no lesions or exudates. Teeth in good repair. Neck: No deformities, masses, or tenderness noted. Range of motion & Thyroid normal Lungs: Normal respiratory effort; chest expands symmetrically. Lungs are clear to auscultation without rales, wheezes, or increased work of breathing. Heart: Normal rate and  rhythm. Normal S1 and S2. No gallop, click, or rub. S4 with slurring ; no murmur. Abdomen: Bowel sounds normal; abdomen soft and nontender. No masses, organomegaly. Ventral hernia noted. Genitalia/ DRE: Varices and small granuloma noted on the left.Prostate is normal without enlargement, asymmetry, nodularity, or induration.  Musculoskeletal/extremities: No deformity or scoliosis noted of  the thoracic or lumbar spine; but there is mild asymmetry of the paraspinous muscles. Right thoracic muscles are greater than left. No clubbing, cyanosis, edema, or deformity noted. Range of motion  normal .Tone & strength  normal.Joints normal. Nail health  good. He is able to lie flat and sit up without help. Straight leg raising is negative bilaterally Vascular: Carotid, radial artery, dorsalis pedis and  posterior tibial pulses are full and equal. No bruits present. Neurologic: Alert and oriented x3. Deep tendon reflexes symmetrical and normal . Gait, including heel and toe walking, is normal       Skin: Intact without suspicious lesions or rashes. Keratotic lesion is suggested in the right popliteal space. Lymph: No cervical, axillary, or inguinal lymphadenopathy present. Psych: Mood and affect are normal. Normally interactive                                                                                         Assessment & Plan:  #1 comprehensive physical exam; no acute  findings #2 see Problem List with Assessments & Recommendations  #3 acute on chronic low back discomfort; no neuromuscular deficit present  #4 over due for colonoscopic screening; he should obtain clearance from his Cardiologist if this is necessary and proceed with an elective procedure. Plan: see Orders

## 2012-03-27 NOTE — Patient Instructions (Signed)
Preventive Health Care: Exercise at least 30-45 minutes a day,  3-4 days a week.  Eat a low-fat diet with lots of fruits and vegetables, up to 7-9 servings per day. Consume less than 40 grams of sugar per day from foods & drinks with High Fructose Corn Sugar as # 1,2,3 or # 4 on label. As per the Standard of Care , screening Colonoscopy recommended @ 50 & every 5-10 years thereafter . More frequent monitor would be dictated by family history or findings @ Colonoscopy . Please contact Dr. Katrinka Blazing for cardiac clearance, if needed, for a screening colonoscopy. The best exercises for the low back include freestyle swimming, stretch aerobics, and yoga. Please call if you do want to be referred to physical therapy.

## 2012-03-28 LAB — HEMOGLOBIN A1C: Hgb A1c MFr Bld: 7 % — ABNORMAL HIGH (ref 4.6–6.5)

## 2012-06-27 LAB — MICROALBUMIN / CREATININE URINE RATIO: Microalb Creat Ratio: 0.5 mg/g (ref 0.0–30.0)

## 2012-06-27 NOTE — Addendum Note (Signed)
Addended by: Silvio Pate D on: 06/27/2012 11:29 AM   Modules accepted: Orders

## 2012-06-27 NOTE — Addendum Note (Signed)
Addended by: Silvio Pate D on: 06/27/2012 11:26 AM   Modules accepted: Orders

## 2012-08-02 ENCOUNTER — Telehealth: Payer: Self-pay | Admitting: Internal Medicine

## 2012-08-02 NOTE — Telephone Encounter (Signed)
Refill: Hydrocodone/acetaminophen 7.5-500 mg tab. Last fill 03-27-12

## 2012-08-03 NOTE — Telephone Encounter (Signed)
Please advise, Hydrocodone is not on medication list

## 2012-08-03 NOTE — Telephone Encounter (Signed)
Please verify original Rxing  MD & diagnosis as not on med list

## 2012-08-03 NOTE — Telephone Encounter (Signed)
Spoke with patient, patient states Dr.Hopper originally rx'ed Hydrocodone, he last ordered in April 2013 . Patient states he is not Taking Tramadol at all (currently on medication list). Patient states he takes for back problems.  Dr.Hopper gave the verbal to refill medication

## 2012-08-04 MED ORDER — HYDROCODONE-ACETAMINOPHEN 7.5-500 MG PO TABS: 1.0000 | ORAL_TABLET | ORAL | Status: DC | PRN

## 2012-08-04 NOTE — Telephone Encounter (Signed)
RX called in .

## 2012-08-30 ENCOUNTER — Encounter: Payer: Self-pay | Admitting: Internal Medicine

## 2012-08-30 ENCOUNTER — Ambulatory Visit (INDEPENDENT_AMBULATORY_CARE_PROVIDER_SITE_OTHER): Payer: Medicare Other | Admitting: Internal Medicine

## 2012-08-30 VITALS — BP 110/70 | HR 65 | Temp 97.7°F | Wt 198.7 lb

## 2012-08-30 DIAGNOSIS — N529 Male erectile dysfunction, unspecified: Secondary | ICD-10-CM

## 2012-08-30 DIAGNOSIS — R5383 Other fatigue: Secondary | ICD-10-CM

## 2012-08-30 DIAGNOSIS — Z23 Encounter for immunization: Secondary | ICD-10-CM

## 2012-08-30 DIAGNOSIS — E119 Type 2 diabetes mellitus without complications: Secondary | ICD-10-CM

## 2012-08-30 DIAGNOSIS — R5381 Other malaise: Secondary | ICD-10-CM

## 2012-08-30 DIAGNOSIS — R6882 Decreased libido: Secondary | ICD-10-CM

## 2012-08-30 NOTE — Progress Notes (Signed)
  Subjective:    Patient ID: Jeffery [name], male    DOB: Mar 05, 1941, 71 y.o.   MRN: [id number]  HPI He describes fatigue to the point he is asleep by 9 PM. He feels he's lost overall muscle strength and muscle mass. He has had a 20 pound weight loss in the context of an organic diet and exercise program. He walks 2 miles per day and is active in yard work. He also does some weight work.    Review of Systems There is some issue with getting an erection and keeping same. He notes some decreased libido.  The last PSA on record is 0.61 on 09/06/08. Digital rectal exam was negative 03/27/12  His Cardiologist, Dr Katrinka Blazing, monitors  his lipids & hepatic function. His fasting blood sugar range 90-105. 2 hour postprandial sugars have been less than 130. The A1c was 7% on 03/27/12.    Objective:   Physical Exam  Gen.: Healthy and well-nourished in appearance. Alert, appropriate and cooperative throughout exam.   Eyes: No corneal or conjunctival inflammation noted. No lid lag or proptosis.  Extraocular motion intact. Vision grossly normal. Neck: No deformities, masses, or tenderness noted. Range of motion & Thyroid normal. Lungs: Normal respiratory effort; chest expands symmetrically. Lungs are clear to auscultation without rales, wheezes, or increased work of breathing. Heart:Distant heart sounds. Normal rate and rhythm. Normal S1 and S2. No gallop, click, or rub. S4 w/o murmur.  Abdomen: Bowel sounds normal; abdomen soft and nontender. No masses, organomegaly or hernias noted. Musculoskeletal/extremities: No clubbing, cyanosis, edema, or deformity noted. Nail health  good. Vascular: Carotid, radial artery, dorsalis pedis and  posterior tibial pulses are full and equal. No bruits present. Neurologic: Alert and oriented x3. Deep tendon reflexes symmetrical and normal.          Skin: Intact without suspicious lesions or rashes. Lymph: No cervical, axillary lymphadenopathy present. Psych: Mood and affect  are normal. Normally interactive                                                                                         Assessment & Plan:  #1 weakness and fatigue  #2 diabetes; home readings suggest excellent control  #3 erectile dysfunction  #4 decreased libido  Plan: Screening testosterone @ 8  a.m. with TSH,CBC & dif,BMET and A1c

## 2012-08-30 NOTE — Patient Instructions (Addendum)
Please  schedule fasting Labs : BMET, CBC & dif, TSH,A1c, PSA, screening Testosterone (NOT free Testosterone). PLEASE BRING THESE INSTRUCTIONS TO FOLLOW UP  LAB APPOINTMENT.This will guarantee correct labs are drawn, eliminating need for repeat blood sampling ( needle sticks ! ). Diagnoses /Codes: 780.79,250.00,799.81,607.84  If you activate My Chart; the results can be released to you as soon as they populate from the lab. If you choose not to use this program; the labs have to be reviewed, copied & mailed   causing a delay in getting the results to you.

## 2012-08-31 ENCOUNTER — Other Ambulatory Visit (INDEPENDENT_AMBULATORY_CARE_PROVIDER_SITE_OTHER): Payer: Medicare Other

## 2012-08-31 DIAGNOSIS — E119 Type 2 diabetes mellitus without complications: Secondary | ICD-10-CM

## 2012-08-31 DIAGNOSIS — R5381 Other malaise: Secondary | ICD-10-CM

## 2012-08-31 DIAGNOSIS — R6882 Decreased libido: Secondary | ICD-10-CM

## 2012-08-31 DIAGNOSIS — R5383 Other fatigue: Secondary | ICD-10-CM

## 2012-08-31 DIAGNOSIS — Z125 Encounter for screening for malignant neoplasm of prostate: Secondary | ICD-10-CM

## 2012-08-31 LAB — CBC WITH DIFFERENTIAL/PLATELET
Basophils Absolute: 0 10*3/uL (ref 0.0–0.1)
Basophils Relative: 0.3 % (ref 0.0–3.0)
Eosinophils Absolute: 0.2 10*3/uL (ref 0.0–0.7)
Lymphocytes Relative: 14.1 % (ref 12.0–46.0)
MCHC: 33.3 g/dL (ref 30.0–36.0)
MCV: 88.4 fl (ref 78.0–100.0)
Monocytes Absolute: 0.5 10*3/uL (ref 0.1–1.0)
Neutrophils Relative %: 73.3 % (ref 43.0–77.0)
Platelets: 249 10*3/uL (ref 150.0–400.0)
RDW: 14.6 % (ref 11.5–14.6)

## 2012-08-31 LAB — BASIC METABOLIC PANEL
BUN: 22 mg/dL (ref 6–23)
CO2: 29 mEq/L (ref 19–32)
Calcium: 9 mg/dL (ref 8.4–10.5)
Chloride: 103 mEq/L (ref 96–112)
Creatinine, Ser: 1.2 mg/dL (ref 0.4–1.5)
Glucose, Bld: 121 mg/dL — ABNORMAL HIGH (ref 70–99)

## 2012-08-31 LAB — HEMOGLOBIN A1C: Hgb A1c MFr Bld: 6.4 % (ref 4.6–6.5)

## 2012-08-31 LAB — TSH: TSH: 1.21 u[IU]/mL (ref 0.35–5.50)

## 2012-08-31 LAB — PSA: PSA: 0.79 ng/mL (ref 0.10–4.00)

## 2012-08-31 NOTE — Progress Notes (Signed)
Labs only

## 2012-09-02 ENCOUNTER — Encounter: Payer: Self-pay | Admitting: Internal Medicine

## 2012-09-04 ENCOUNTER — Encounter: Payer: Self-pay | Admitting: Internal Medicine

## 2012-09-06 ENCOUNTER — Other Ambulatory Visit (INDEPENDENT_AMBULATORY_CARE_PROVIDER_SITE_OTHER): Payer: Medicare Other

## 2012-09-06 DIAGNOSIS — R7989 Other specified abnormal findings of blood chemistry: Secondary | ICD-10-CM

## 2012-09-06 DIAGNOSIS — E291 Testicular hypofunction: Secondary | ICD-10-CM

## 2012-09-07 LAB — TESTOSTERONE, FREE, TOTAL, SHBG: Sex Hormone Binding: 31 nmol/L (ref 13–71)

## 2012-09-12 ENCOUNTER — Encounter: Payer: Self-pay | Admitting: Internal Medicine

## 2012-09-18 ENCOUNTER — Other Ambulatory Visit: Payer: Self-pay | Admitting: Internal Medicine

## 2012-09-18 ENCOUNTER — Encounter: Payer: Self-pay | Admitting: Internal Medicine

## 2012-09-18 ENCOUNTER — Other Ambulatory Visit: Payer: Self-pay

## 2012-09-18 DIAGNOSIS — E349 Endocrine disorder, unspecified: Secondary | ICD-10-CM

## 2012-09-18 MED ORDER — TESTOSTERONE 20.25 MG/1.25GM (1.62%) TD GEL: 5.0000 g | TRANSDERMAL | Status: DC

## 2012-11-03 ENCOUNTER — Telehealth: Payer: Self-pay | Admitting: Internal Medicine

## 2012-11-03 MED ORDER — LORAZEPAM 1 MG PO TABS: 1.0000 mg | ORAL_TABLET | Freq: Every evening | ORAL | Status: DC | PRN

## 2012-11-03 MED ORDER — HYDROCODONE-ACETAMINOPHEN 7.5-500 MG PO TABS: 1.0000 | ORAL_TABLET | ORAL | Status: DC | PRN

## 2012-11-03 NOTE — Telephone Encounter (Signed)
Refill: Hydroco/apap 7.5-500 tab. Last fill 08-04-12

## 2012-11-03 NOTE — Telephone Encounter (Signed)
Refill: Lorazepam 1 mg tab. Last fill 09-02-12

## 2012-11-03 NOTE — Telephone Encounter (Signed)
RX's called in  

## 2012-11-06 ENCOUNTER — Encounter: Payer: Self-pay | Admitting: Internal Medicine

## 2012-11-15 ENCOUNTER — Ambulatory Visit (INDEPENDENT_AMBULATORY_CARE_PROVIDER_SITE_OTHER): Payer: Medicare Other | Admitting: Family Medicine

## 2012-11-15 VITALS — BP 135/76 | HR 78 | Temp 98.9°F | Resp 16 | Ht 69.0 in | Wt 206.0 lb

## 2012-11-15 DIAGNOSIS — J45901 Unspecified asthma with (acute) exacerbation: Secondary | ICD-10-CM

## 2012-11-15 DIAGNOSIS — E119 Type 2 diabetes mellitus without complications: Secondary | ICD-10-CM

## 2012-11-15 DIAGNOSIS — J209 Acute bronchitis, unspecified: Secondary | ICD-10-CM

## 2012-11-15 MED ORDER — PREDNISONE 20 MG PO TABS: ORAL_TABLET | ORAL | Status: DC

## 2012-11-15 MED ORDER — HYDROCOD POLST-CHLORPHEN POLST 10-8 MG/5ML PO LQCR: 5.0000 mL | Freq: Two times a day (BID) | ORAL | Status: DC | PRN

## 2012-11-15 MED ORDER — AZITHROMYCIN 250 MG PO TABS: ORAL_TABLET | ORAL | Status: DC

## 2012-11-15 NOTE — Patient Instructions (Addendum)
1. Acute bronchitis  azithromycin (ZITHROMAX Z-PAK) 250 MG tablet, chlorpheniramine-HYDROcodone (TUSSIONEX PENNKINETIC ER) 10-8 MG/5ML LQCR  2. Asthma exacerbation  predniSONE (DELTASONE) 20 MG tablet

## 2012-11-15 NOTE — Progress Notes (Signed)
[address]   [place], [place]  [address]   [phone]  Subjective:    Patient ID: Jeffery [name], male    DOB: 11/17/1941, 71 y.o.   MRN: [id number]  HPIThis 71 y.o. male presents for evaluation of cough, wheezing.  Onset of URI from grandchild; onset five days.  Starts in head and always ends up in chest.  Onset of asthma since age 50.  End expiratory 69% at baseline.  Coughed all night; large amount of mucous from chest.  Low grade fever here in office; no fevers at home.  Xopenex q 6 for past few days.  Dulera compliance.  Normal asthma drugs.  Mucinex every 12 hours.  No fever/chills/sweats.  Mild HA.  No ear pain; +ST from PND, coughing.  +rhinorrhea and nasal congestion but now improved.  +horrible cough with sputum production.  No SOB.  +wheezing.  No n/v/d.  S/p flu vaccine at PCP office.  History of pneumonia in past; usually runs fever>100 and develops SOB, chest pain; does not feel that badly today.  Prefers to avoid quinolones if at all possible.  No recent antibiotics.  Usually suffers with two asthma exacerbations per year.  Last Xopenex use last night; due for nebulizer; prefers to administer at home.  Does not check peak flows at home; best peak flow in past 480.   Review of Systems  Constitutional: Negative for fever, chills, diaphoresis and fatigue.  HENT: Positive for congestion, sore throat, rhinorrhea and postnasal drip. Negative for ear pain, sneezing and sinus pressure.   Respiratory: Positive for cough and wheezing. Negative for shortness of breath.   Gastrointestinal: Negative for nausea, vomiting and diarrhea.  Skin: Negative for rash.    Past Medical History  Diagnosis Date  . Asthma   . CAD (coronary artery disease)   . LBBB (left bundle branch block) 1999  . GERD (gastroesophageal reflux disease)   . Seasonal allergies   . HTN (hypertension)   . Hyperlipidemia     Past Surgical History  Procedure Date  . Cardiac catheterization 2003    Dr Garnette Scheuermann;  85 % R circumflex obstruction  . Inguinal hernia repair     right, age 71  . Inguinal hernia repair     left age 33  . Lumbar laminectomy     age 20  . Tonsillectomy     age 85    Prior to Admission medications   Medication Sig Start Date End Date Taking? Authorizing Provider  aspirin 81 MG tablet Take 81 mg by mouth at bedtime.     Yes Historical Provider, MD  beclomethasone (QVAR) 40 MCG/ACT inhaler Inhale 2 puffs into the lungs 2 (two) times daily.   Yes Historical Provider, MD  Coenzyme Q10 (COQ10) 100 MG CAPS Take 200 mg by mouth daily.     Yes Historical Provider, MD  fexofenadine (ALLEGRA) 180 MG tablet Take 180 mg by mouth daily as needed.     Yes Historical Provider, MD  glucose blood (ONE TOUCH TEST STRIPS) test strip 1 each by Other route as needed. Check blood sugar 6 times per day    Yes Historical Provider, MD  HYDROcodone-acetaminophen (LORTAB) 7.5-500 MG per tablet Take 1 tablet by mouth every 4 (four) hours as needed. 11/03/12  Yes Pecola Lawless, MD  levalbuterol Pauline Aus) 1.25 MG/3ML nebulizer solution Take 1 ampule by nebulization as needed. Use in nebulizer prn    Yes Historical Provider, MD  lisinopril-hydrochlorothiazide (PRINZIDE,ZESTORETIC) 20-12.5 MG per tablet  Take 1 tablet by mouth daily.     Yes Historical Provider, MD  LORazepam (ATIVAN) 1 MG tablet Take 1 tablet (1 mg total) by mouth at bedtime as needed. 11/03/12  Yes Pecola Lawless, MD  Mometasone Furo-Formoterol Fum (DULERA) 200-5 MCG/ACT AERO Inhale 2 puffs into the lungs 2 (two) times daily.   Yes Historical Provider, MD  montelukast (SINGULAIR) 10 MG tablet Take 10 mg by mouth daily.     Yes Historical Provider, MD  Multiple Vitamin (MULTIVITAMIN) tablet Take 1 tablet by mouth daily.     Yes Historical Provider, MD  nebivolol (BYSTOLIC) 5 MG tablet Take 5 mg by mouth daily.     Yes Historical Provider, MD  ONE TOUCH LANCETS MISC by Does not apply route as directed. Check blood sugar 6 time per day     Yes Historical Provider, MD  rosuvastatin (CRESTOR) 5 MG tablet Take 5 mg by mouth. 1 by mouth three times week (M/W/F)   Yes Historical Provider, MD  albuterol (PROAIR HFA) 108 (90 BASE) MCG/ACT inhaler Inhale 2 puffs into the lungs as needed.      Historical Provider, MD  azithromycin (ZITHROMAX Z-PAK) 250 MG tablet Two tablets daily x 5 days 11/15/12   Ethelda Chick, MD  chlorpheniramine-HYDROcodone [facility] PENNKINETIC ER) 10-8 MG/5ML LQCR Take 5 mLs by mouth every 12 (twelve) hours as needed (cough). 11/15/12   Ethelda Chick, MD  esomeprazole (NEXIUM) 40 MG capsule Take 40 mg by mouth daily.      Historical Provider, MD  predniSONE (DELTASONE) 20 MG tablet Three tablets daily x 1 day then two tablets daily x 4 days, then one tablet daily x 5 days 11/15/12   Ethelda Chick, MD  Testosterone (ANDROGEL) 20.25 MG/1.25GM (1.62%) GEL Place 5 g onto the skin 1 day or 1 dose. 09/18/12   Pecola Lawless, MD  vardenafil (LEVITRA) 20 MG tablet Take 1 tablet (20 mg total) by mouth as directed. 0.5-1 once dail prn, cannot be taken with NTG 02/09/12   Pecola Lawless, MD    No Known Allergies  History   Social History  . Marital Status: Married    Spouse Name: N/A    Number of Children: N/A  . Years of Education: N/A   Occupational History  . Not on file.   Social History Main Topics  . Smoking status: Former Smoker    Quit date: 12/14/1963  . Smokeless tobacco: Not on file  . Alcohol Use: 1.2 oz/week    2 Glasses of wine per week     Comment: socially  . Drug Use: No  . Sexually Active:    Other Topics Concern  . Not on file   Social History Narrative  . No narrative on file    Family History  Problem Relation Age of Onset  . Heart attack Father     48s  . Heart failure Mother     90s  . Subarachnoid hemorrhage Mother 14  . Hypertension Mother   . Subarachnoid hemorrhage Paternal Grandfather 36  . Diabetes Maternal Aunt        Objective:   Physical Exam  Nursing note  and vitals reviewed. Constitutional: He appears well-developed and well-nourished. No distress.  HENT:  Head: Normocephalic and atraumatic.  Right Ear: External ear normal.  Left Ear: External ear normal.  Nose: Nose normal.  Mouth/Throat: Oropharynx is clear and moist.  Eyes: Conjunctivae normal and EOM are normal. Pupils are equal, round,  and reactive to light.  Neck: Normal range of motion. Neck supple. No thyromegaly present.  Cardiovascular: Normal rate, regular rhythm and normal heart sounds.  Exam reveals no gallop and no friction rub.   No murmur heard. Pulmonary/Chest: Effort normal. No respiratory distress. He has wheezes in the right upper field, the right middle field, the left upper field and the left middle field. He has no rhonchi. He has no rales.       +inspiratory and expiratory wheezing anterior lung fields B; +wheezing inspiratory and expiratory RUL, LUL.  Moderate air movement.  No tachypnea.  No asseccory muscle use.  No nasal flaring.  Musculoskeletal: He exhibits no edema.  Lymphadenopathy:    He has no cervical adenopathy.  Skin: He is not diaphoretic.   PEAK FLOW:  350    Assessment & Plan:   1. Acute bronchitis  azithromycin (ZITHROMAX Z-PAK) 250 MG tablet, chlorpheniramine-HYDROcodone (TUSSIONEX PENNKINETIC ER) 10-8 MG/5ML LQCR  2. Asthma exacerbation  predniSONE (DELTASONE) 20 MG tablet     1. Acute Bronchitis:  New.  Rx for Zithromax provided; also rx for Tussionex provided.  Continue Mucinex bid.  RTC for development of fever, shortness of breath. 2.  Asthma Exacerbation: Mild to moderate.  Rx for Prednisone provided; continue Xopenex every 6-8 hours. RTC for acute worsening. 3. DMII: controlled; monitor sugars closely with Prednisone therapy.

## 2012-11-18 NOTE — Progress Notes (Signed)
Reviewed and agree.

## 2013-01-06 ENCOUNTER — Other Ambulatory Visit: Payer: Self-pay | Admitting: Internal Medicine

## 2013-02-23 ENCOUNTER — Telehealth: Payer: Self-pay | Admitting: Internal Medicine

## 2013-02-23 MED ORDER — LORAZEPAM 1 MG PO TABS: 1.0000 mg | ORAL_TABLET | Freq: Every evening | ORAL | Status: DC | PRN

## 2013-02-23 NOTE — Telephone Encounter (Signed)
Last OV 08/2012, Patient aware Controlled Substance Contract to be sign and rx to be picked up

## 2013-02-23 NOTE — Telephone Encounter (Signed)
refill lorazepam 1mg  tab #30 take one tablet by mouth nightly at bedtime last fill 1.13.14

## 2013-02-27 ENCOUNTER — Encounter: Payer: Self-pay | Admitting: Internal Medicine

## 2013-02-27 ENCOUNTER — Ambulatory Visit (INDEPENDENT_AMBULATORY_CARE_PROVIDER_SITE_OTHER): Payer: Medicare Other | Admitting: Internal Medicine

## 2013-02-27 VITALS — BP 138/80 | HR 70 | Wt 209.0 lb

## 2013-02-27 DIAGNOSIS — K219 Gastro-esophageal reflux disease without esophagitis: Secondary | ICD-10-CM

## 2013-02-27 DIAGNOSIS — J45909 Unspecified asthma, uncomplicated: Secondary | ICD-10-CM

## 2013-02-27 DIAGNOSIS — G5602 Carpal tunnel syndrome, left upper limb: Secondary | ICD-10-CM

## 2013-02-27 DIAGNOSIS — E119 Type 2 diabetes mellitus without complications: Secondary | ICD-10-CM

## 2013-02-27 DIAGNOSIS — G5600 Carpal tunnel syndrome, unspecified upper limb: Secondary | ICD-10-CM

## 2013-02-27 MED ORDER — ESOMEPRAZOLE MAGNESIUM 40 MG PO CPDR: 40.0000 mg | DELAYED_RELEASE_CAPSULE | Freq: Every day | ORAL | Status: DC

## 2013-02-27 MED ORDER — GLUCOSE BLOOD VI STRP: ORAL_STRIP | Status: DC

## 2013-02-27 NOTE — Progress Notes (Signed)
  Subjective:    Patient ID: Jeffery [name], male    DOB: 07-29-1941, 72 y.o.   MRN: [id number]  HPI  He had been on Nexium for reflux symptoms manifested as constant clearing of the throat with intermittent globus sensation. This  also helped control his asthma. Despite this his present allergies do not feel comfortable continuing this prescription and recommended he obtain a maintenance prescription from the gastroenterologist.   He has had some numbness in his hands for 2-3 years but this is exacerbated in the last several months. It is worse overnight and can be  associated with some discomfort as high as the upper forearms. L hand involvement > R.     Review of Systems  He denies significant dyspepsia, dysphagia, abdominal pain, unexplained weight loss, melena, or rectal bleeding. TSH was normal in 9/13       Objective:   Physical Exam    General appearance is one of good health and nourishment w/o distress.  Eyes: No conjunctival inflammation or scleral icterus is present.  Neck: Thyroid normal to palpation without enlargement or nodules  Oral exam: Dental hygiene is good; lips and gums are healthy appearing.There is no oropharyngeal erythema or exudate noted.   Heart:  Normal rate and regular rhythm. S1 and S2 normal without gallop, murmur, click, rub or other extra sounds     Lungs:Chest clear to auscultation; no wheezes, rhonchi,rales ,or rubs present.No increased work of breathing.   Abdomen: bowel sounds normal, soft and non-tender without masses, organomegaly or hernias noted.  No guarding or rebound   Skin:Warm & dry.  Intact without suspicious lesions or rashes ; no jaundice   Lymphatic: No lymphadenopathy is noted about the head, neck, axilla .  Neuro: Normal deep tendon reflexes. Tinel's sign positive left wrist          Assessment & Plan:   #1 asthma, controlled  #2 reflux associated with globus and throat clearing. This has contributed to the  asthma. This has been controlled completely by Nexium.  #3 carpal tunnel syndrome, left upper extremity greater than right  Plan: Nexium will be filled. He may need to negotiate with his  insurance company as to coverage  Wrist splint will be prescribed overnight for  the left hand. If symptoms fail to improve EMG/nerve conduction studies would be indicated. TSH will be repeated.

## 2013-02-27 NOTE — Patient Instructions (Addendum)
Go to Web M.D. for information on Carpal Tunnel Syndrome. If symptoms persist or progress despite the wrist splint; nerve conduction/EMG studies would be indicated. If these were abnormal, Hand Surgery referral would be indicated. Reflux of gastric acid may be asymptomatic as this may occur mainly during sleep.The triggers for reflux  include stress; the "aspirin family" ; alcohol; peppermint; and caffeine (coffee, tea, cola, and chocolate). The aspirin family would include aspirin and the nonsteroidal agents such as ibuprofen &  Naproxen. Tylenol would not cause reflux. If having symptoms ; food & drink should be avoided for @ least 2 hours before going to bed.

## 2013-02-28 LAB — MICROALBUMIN / CREATININE URINE RATIO
Microalb Creat Ratio: 0.3 mg/g (ref 0.0–30.0)
Microalb, Ur: 0.2 mg/dL (ref 0.0–1.9)

## 2013-02-28 LAB — TSH: TSH: 1.34 u[IU]/mL (ref 0.35–5.50)

## 2013-02-28 LAB — HEMOGLOBIN A1C: Hgb A1c MFr Bld: 6.5 % (ref 4.6–6.5)

## 2013-03-06 ENCOUNTER — Telehealth: Payer: Self-pay | Admitting: Internal Medicine

## 2013-03-06 MED ORDER — MOMETASONE FURO-FORMOTEROL FUM 200-5 MCG/ACT IN AERO: 2.0000 | INHALATION_SPRAY | Freq: Two times a day (BID) | RESPIRATORY_TRACT | Status: DC

## 2013-03-06 NOTE — Telephone Encounter (Signed)
REFILLS ON DULERA 200-5 MCG   AER  SCHE  MOMETASONE FUROATE - FORMOTERO SIG: INHALE TWO PUFFS BY MOUTH TWICE DAILY LAST FILLED 02.17.2014   AUTH REFILLS :2

## 2013-03-06 NOTE — Telephone Encounter (Signed)
RX sent electronically 

## 2013-03-07 ENCOUNTER — Telehealth: Payer: Self-pay | Admitting: Internal Medicine

## 2013-03-07 NOTE — Telephone Encounter (Signed)
REFILLS NEEDED ON HYDROCO / APAP 7.5-500 TAB MAL  LAST FILLED 11.22.2013

## 2013-03-08 MED ORDER — HYDROCODONE-ACETAMINOPHEN 7.5-325 MG PO TABS: 1.0000 | ORAL_TABLET | ORAL | Status: DC | PRN

## 2013-03-08 NOTE — Telephone Encounter (Signed)
7.5-500 is no longer available. Per MD ok to change to 7.5-325. I called patient and informed him of this change.

## 2013-05-22 ENCOUNTER — Telehealth: Payer: Self-pay | Admitting: General Practice

## 2013-05-22 MED ORDER — HYDROCODONE-ACETAMINOPHEN 7.5-325 MG PO TABS: 1.0000 | ORAL_TABLET | ORAL | Status: DC | PRN

## 2013-05-22 NOTE — Telephone Encounter (Signed)
Med called in

## 2013-05-22 NOTE — Telephone Encounter (Signed)
OK #30 

## 2013-05-22 NOTE — Telephone Encounter (Signed)
HYDROcodone-acetaminophen (NORCO) 7.5-325 MG per tablet Last OV 02-27-2013 Med filled 03/08/2013 #30 0 refills  [facility]

## 2013-06-04 ENCOUNTER — Telehealth: Payer: Self-pay | Admitting: *Deleted

## 2013-06-04 NOTE — Telephone Encounter (Signed)
Ok X1 

## 2013-06-04 NOTE — Telephone Encounter (Signed)
Last OV3-18-14, Last refilled3-14-14 #30 1

## 2013-06-05 MED ORDER — LORAZEPAM 1 MG PO TABS: 1.0000 mg | ORAL_TABLET | Freq: Every evening | ORAL | Status: DC | PRN

## 2013-06-05 NOTE — Telephone Encounter (Signed)
Rx sent 

## 2013-07-18 ENCOUNTER — Other Ambulatory Visit: Payer: Self-pay

## 2013-07-24 ENCOUNTER — Telehealth: Payer: Self-pay | Admitting: *Deleted

## 2013-07-24 MED ORDER — LORAZEPAM 1 MG PO TABS: 1.0000 mg | ORAL_TABLET | Freq: Every evening | ORAL | Status: DC | PRN

## 2013-07-24 NOTE — Telephone Encounter (Signed)
OK X1 

## 2013-07-24 NOTE — Telephone Encounter (Signed)
Rx has been filled and printed waiting for signature and fax. Ag cma

## 2013-07-24 NOTE — Telephone Encounter (Signed)
Pharmacy's requesting a refill for Ativan 1 mg.  Last ov 02/27/13 last date filled 06/05/13 #30 0 R last UDS 02/26/13 patient has a contract on file (low risk) Please Advise   Ag cma

## 2013-08-07 ENCOUNTER — Other Ambulatory Visit: Payer: Self-pay | Admitting: Internal Medicine

## 2013-08-07 ENCOUNTER — Encounter: Payer: Self-pay | Admitting: Internal Medicine

## 2013-08-08 ENCOUNTER — Other Ambulatory Visit: Payer: Self-pay | Admitting: *Deleted

## 2013-08-08 DIAGNOSIS — J45901 Unspecified asthma with (acute) exacerbation: Secondary | ICD-10-CM

## 2013-08-08 MED ORDER — MOMETASONE FURO-FORMOTEROL FUM 200-5 MCG/ACT IN AERO: 2.0000 | INHALATION_SPRAY | Freq: Two times a day (BID) | RESPIRATORY_TRACT | Status: DC

## 2013-08-08 NOTE — Telephone Encounter (Signed)
Refill for dulera sent to [facility]

## 2013-08-27 ENCOUNTER — Telehealth: Payer: Self-pay | Admitting: *Deleted

## 2013-08-27 ENCOUNTER — Ambulatory Visit (INDEPENDENT_AMBULATORY_CARE_PROVIDER_SITE_OTHER): Payer: Medicare Other | Admitting: Internal Medicine

## 2013-08-27 ENCOUNTER — Encounter: Payer: Self-pay | Admitting: Internal Medicine

## 2013-08-27 VITALS — BP 101/61 | HR 54 | Temp 98.5°F | Resp 14 | Wt 211.4 lb

## 2013-08-27 DIAGNOSIS — J440 Chronic obstructive pulmonary disease with acute lower respiratory infection: Secondary | ICD-10-CM

## 2013-08-27 MED ORDER — PREDNISONE 20 MG PO TABS: 20.0000 mg | ORAL_TABLET | Freq: Two times a day (BID) | ORAL | Status: DC

## 2013-08-27 MED ORDER — HYDROCODONE-ACETAMINOPHEN 7.5-325 MG PO TABS: 1.0000 | ORAL_TABLET | ORAL | Status: DC | PRN

## 2013-08-27 MED ORDER — CEFUROXIME AXETIL 500 MG PO TABS: 500.0000 mg | ORAL_TABLET | Freq: Two times a day (BID) | ORAL | Status: DC

## 2013-08-27 NOTE — Progress Notes (Signed)
  Subjective:    Patient ID: Jeffery [name], male    DOB: 1941/01/13, 72 y.o.   MRN: [id number]  HPI   Symptoms began 08/14/13 as itchy ears and scratchy throat after exposure to a son-in-law who is ill. Actually multiple family members became ill subsequent to this exposure  As of 9/8 he developed paroxysmal cough with white/yellow sputum and "chunks". This was associated with shortness of breath and wheezing; shortness of breath was worse with exertion. He did experience a dull ache in the left posterior thorax last night which resolved after an 8 production of significant amount of sputum. He has not had definite pleuritic chest pain  He did increase his Xopenex to every 6 hours in addition to maintenance Dulera bid.He also employed Mucinex DM.    Review of Systems  He denies any other extrinsic symptoms such as itchy, watery eyes or sneezing  He also denies fever, chills, sweats  There were no rhinosinusitis symptoms of frontal headache, facial pain, or nasal purulence.     Objective:   Physical Exam General appearance:good health ;well nourished; no acute distress or increased work of breathing is present.  No  lymphadenopathy about the head, neck, or axilla noted.   Eyes: No conjunctival inflammation or lid edema is present. There is no scleral icterus.  Ears:  External ear exam shows no significant lesions or deformities.  Otoscopic examination reveals clear canals, tympanic membranes are intact bilaterally without bulging, retraction, inflammation or discharge.  Nose:  External nasal examination shows no deformity or inflammation. Nasal mucosa are pink and moist without lesions or exudates. No septal dislocation or deviation.No obstruction to airflow.   Oral exam: Dental hygiene is good; lips and gums are healthy appearing.There is no oropharyngeal erythema or exudate noted.   Neck:  No deformities, thyromegaly, masses, or tenderness noted.   Supple with full range of motion  without pain.   Heart:  Distant heart sounds.Normal rate and regular rhythm. S1 and S2 normal without gallop, murmur, click, rub or other extra sounds.   Lungs:Chest clear to auscultation. Scattered low grade wheezes,rhonchi, & rales  present.No increased work of breathing.    Extremities:  No cyanosis, edema, or clubbing  noted    Skin: Warm & dry          Assessment & Plan:  #1 acute bronchitis with bronchospasm  Plan: See orders and recommendations

## 2013-08-27 NOTE — Patient Instructions (Addendum)
The prednisone can be discontinued once the cough has been resolved for 48-72  hours; it dose not need to be weaned.

## 2013-08-27 NOTE — Telephone Encounter (Signed)
Rx request for Norco 7.5-325mg  Last ov 02/27/13  Last date filled 05/22/13 # 30 0 R  Last UDS 02/26/13  Patient has contract on file.  Please Advise  Ag cma   

## 2013-08-28 ENCOUNTER — Telehealth: Payer: Self-pay | Admitting: *Deleted

## 2013-08-28 NOTE — Telephone Encounter (Signed)
Filled 9/15 @OV 

## 2013-08-28 NOTE — Telephone Encounter (Signed)
Rx request for Norco 7.5-325mg   Last ov 02/27/13  Last date filled 05/22/13 # 30 0 R  Last UDS 02/26/13  Patient has contract on file.  Please Advise  Ag cma

## 2013-09-12 ENCOUNTER — Other Ambulatory Visit: Payer: Self-pay | Admitting: Internal Medicine

## 2013-09-13 NOTE — Telephone Encounter (Signed)
OK X1 

## 2013-09-13 NOTE — Telephone Encounter (Signed)
Ok to refill? Last OV 9.15.14 Last filled 8.12.14

## 2013-09-14 NOTE — Telephone Encounter (Signed)
Refill done.  

## 2013-09-17 ENCOUNTER — Other Ambulatory Visit: Payer: Self-pay | Admitting: Interventional Cardiology

## 2013-10-03 ENCOUNTER — Other Ambulatory Visit: Payer: Self-pay | Admitting: Interventional Cardiology

## 2013-10-09 ENCOUNTER — Ambulatory Visit (INDEPENDENT_AMBULATORY_CARE_PROVIDER_SITE_OTHER): Payer: Medicare Other | Admitting: General Practice

## 2013-10-09 DIAGNOSIS — Z23 Encounter for immunization: Secondary | ICD-10-CM

## 2013-10-18 ENCOUNTER — Other Ambulatory Visit: Payer: Self-pay

## 2013-10-31 ENCOUNTER — Other Ambulatory Visit: Payer: Self-pay | Admitting: Internal Medicine

## 2013-11-02 NOTE — Telephone Encounter (Signed)
Patient is requesting refill for Ativan  Last seen-08/27/2013  Last filled-09/12/2013  UDS-02/26/2013 low risk, contract signed  Please advise. SW

## 2013-11-02 NOTE — Telephone Encounter (Signed)
OK X1 

## 2013-11-05 ENCOUNTER — Other Ambulatory Visit: Payer: Self-pay | Admitting: Internal Medicine

## 2013-11-07 ENCOUNTER — Other Ambulatory Visit: Payer: Self-pay | Admitting: Internal Medicine

## 2013-11-07 ENCOUNTER — Other Ambulatory Visit: Payer: Self-pay | Admitting: *Deleted

## 2013-11-07 ENCOUNTER — Encounter: Payer: Self-pay | Admitting: Internal Medicine

## 2013-11-07 MED ORDER — HYDROCODONE-ACETAMINOPHEN 7.5-325 MG PO TABS: 1.0000 | ORAL_TABLET | ORAL | Status: DC | PRN

## 2013-11-07 MED ORDER — MONTELUKAST SODIUM 10 MG PO TABS: 10.0000 mg | ORAL_TABLET | Freq: Every day | ORAL | Status: DC

## 2013-11-07 NOTE — Telephone Encounter (Signed)
Lorazepam and montelukast refilled per protocol

## 2013-11-07 NOTE — Telephone Encounter (Signed)
HYDROcodone-acetaminophen (NORCO) 7.5-325 MG per tablet Last refill: 08/27/2013, #30 Last OV: 08/27/2013 Last UDS: 02/26/2013, Low risk

## 2013-11-07 NOTE — Telephone Encounter (Signed)
OK X1 

## 2013-11-22 ENCOUNTER — Telehealth: Payer: Self-pay | Admitting: Interventional Cardiology

## 2013-11-22 DIAGNOSIS — E785 Hyperlipidemia, unspecified: Secondary | ICD-10-CM

## 2013-11-22 NOTE — Telephone Encounter (Signed)
New message     Pt is not scheduled to see Riki Rusk for lipid clinic-----Will Dr Katrinka Blazing still want him to see Riki Rusk for lipid/liver test and see Riki Rusk a week or so later?

## 2013-11-23 NOTE — Telephone Encounter (Signed)
pt will come in on 11/26/13 for datsing nmr lipid/hepatic. scheduling will call pt to schedule a a lipid clinic appt with Riki Rusk.pt verbalized understanding.

## 2013-11-26 ENCOUNTER — Telehealth: Payer: Self-pay | Admitting: Pharmacist

## 2013-11-26 ENCOUNTER — Encounter: Payer: Self-pay | Admitting: Interventional Cardiology

## 2013-11-26 ENCOUNTER — Other Ambulatory Visit: Payer: Medicare Other

## 2013-11-26 NOTE — Telephone Encounter (Signed)
Patient called to make an appointment to f/u with [facility] in lipid clinic for this week.  He is getting his NMR drawn on 11/27/13.  Appointment made for him to f/u with me on 11/29/13.

## 2013-11-27 ENCOUNTER — Other Ambulatory Visit (INDEPENDENT_AMBULATORY_CARE_PROVIDER_SITE_OTHER): Payer: Medicare Other

## 2013-11-27 ENCOUNTER — Ambulatory Visit (INDEPENDENT_AMBULATORY_CARE_PROVIDER_SITE_OTHER): Payer: Medicare Other | Admitting: Interventional Cardiology

## 2013-11-27 ENCOUNTER — Encounter: Payer: Self-pay | Admitting: Interventional Cardiology

## 2013-11-27 VITALS — BP 114/62 | HR 73 | Ht 69.0 in | Wt 206.8 lb

## 2013-11-27 DIAGNOSIS — I2510 Atherosclerotic heart disease of native coronary artery without angina pectoris: Secondary | ICD-10-CM

## 2013-11-27 DIAGNOSIS — E119 Type 2 diabetes mellitus without complications: Secondary | ICD-10-CM

## 2013-11-27 DIAGNOSIS — E785 Hyperlipidemia, unspecified: Secondary | ICD-10-CM

## 2013-11-27 DIAGNOSIS — E1159 Type 2 diabetes mellitus with other circulatory complications: Secondary | ICD-10-CM | POA: Insufficient documentation

## 2013-11-27 DIAGNOSIS — I447 Left bundle-branch block, unspecified: Secondary | ICD-10-CM

## 2013-11-27 DIAGNOSIS — I10 Essential (primary) hypertension: Secondary | ICD-10-CM

## 2013-11-27 DIAGNOSIS — J45909 Unspecified asthma, uncomplicated: Secondary | ICD-10-CM

## 2013-11-27 LAB — HEPATIC FUNCTION PANEL
AST: 34 U/L (ref 0–37)
Albumin: 4.2 g/dL (ref 3.5–5.2)

## 2013-11-27 MED ORDER — LISINOPRIL-HYDROCHLOROTHIAZIDE 20-12.5 MG PO TABS: ORAL_TABLET | ORAL | Status: DC

## 2013-11-27 MED ORDER — ROSUVASTATIN CALCIUM 5 MG PO TABS: 5.0000 mg | ORAL_TABLET | Freq: Every day | ORAL | Status: DC

## 2013-11-27 MED ORDER — NEBIVOLOL HCL 5 MG PO TABS: ORAL_TABLET | ORAL | Status: DC

## 2013-11-27 NOTE — Progress Notes (Signed)
Patient ID: Jeffery [name], male   DOB: [birth year], 72 y.o.   MRN: [id number] Past Medical History  LV dysfunction, EF 45% with mild-to-moderate LV cavity enlargement cardiac MRI, 06/2008   Ventricular ectopy and occasional symptomatic ventricular bigeminy   Hypertension   Hyperlipidemia, difficult tolerating statins   Asthma intolerant to many beta blockers   Left bundle branch block   Prior tobacco use   Coronary artery disease with 50-70% first obtuse marginal, 40% second obtuse marginal and 20% proximal LAD by catheter 2002      [address] [place], [place]  [address] Phone: [phone] Fax:  [phone]  Date:  11/27/2013   ID:  Jeffery [name], DOB 1941/02/28, MRN [id number]  PCP:  Jeffery Melnick, MD   ASSESSMENT:  1. Hypertension, controlled 2. Nonobstructive coronary disease, asymptomatic 3. Hyperlipidemia managed in the lipid clinic 4. Asymptomatic left bundle branch block  PLAN:  1. I encouraged exercise and weight reduction with aerobic activity 2. Clinical followup in one year 3. We'll report the patient's lipid profile when available   SUBJECTIVE: Jeffery [name] is a 72 y.o. male who is now retired as a Garment/textile technologist. He denies cardiopulmonary symptoms other than dyspnea when asthma is active. He denies palpitations, tachycardia, syncope, and other complaints. No history of orthopnea or PND. He has not had transient neurological symptoms. He denies ankle edema and claudication. No medication side effects.   Wt Readings from Last 3 Encounters:  11/27/13 206 lb 12.8 oz (93.804 kg)  08/27/13 211 lb 6.4 oz (95.89 kg)  02/27/13 209 lb (94.802 kg)     Past Medical History  Diagnosis Date  . Asthma   . CAD (coronary artery disease)   . LBBB (left bundle branch block) 1999  . GERD (gastroesophageal reflux disease)   . Seasonal allergies   . HTN (hypertension)   . Hyperlipidemia     Current Outpatient Prescriptions  Medication  Sig Dispense Refill  . albuterol (PROAIR HFA) 108 (90 BASE) MCG/ACT inhaler Inhale 2 puffs into the lungs as needed.        [name] aspirin 81 MG tablet Take 81 mg by mouth at bedtime.        [name] BYSTOLIC 5 MG tablet Take one tablet by mouth one time daily  90 tablet  1  . cefUROXime (CEFTIN) 500 MG tablet Take 1 tablet (500 mg total) by mouth 2 (two) times daily.  14 tablet  0  . Coenzyme Q10 (COQ10) 100 MG CAPS Take 200 mg by mouth daily.        [name] esomeprazole (NEXIUM) 40 MG capsule Take 1 capsule (40 mg total) by mouth daily.  90 capsule  1  . fexofenadine (ALLEGRA) 180 MG tablet Take 180 mg by mouth daily as needed.        [name] glucose blood (ONE TOUCH TEST STRIPS) test strip Check blood sugar daily as directed DX. 250.00  100 each  3  . HYDROcodone-acetaminophen (NORCO) 7.5-325 MG per tablet Take 1 tablet by mouth every 4 (four) hours as needed.  30 tablet  0  . levalbuterol (XOPENEX) 1.25 MG/3ML nebulizer solution Take 1 ampule by nebulization as needed. Use in nebulizer prn       . LEVITRA 20 MG tablet TAKE 1 TABLET (20 MG TOTAL) BY MOUTH AS DIRECTED. CAN NOT BE TAKENWITH NITROGLYCERIN  6 tablet  5  . lisinopril-hydrochlorothiazide (PRINZIDE,ZESTORETIC) 20-12.5 MG per tablet Take one tablet by mouth one time daily  90 tablet  0  . LORazepam (ATIVAN) 1 MG tablet TAKE ONE-HALF TO ONE TABLET BY MOUTH AT BEDTIME AS NEEDED   30 tablet  0  . mometasone-formoterol (DULERA) 200-5 MCG/ACT AERO Inhale 2 puffs into the lungs 2 (two) times daily.  13 g  3  . montelukast (SINGULAIR) 10 MG tablet Take 1 tablet (10 mg total) by mouth daily.  90 tablet  3  . Multiple Vitamin (MULTIVITAMIN) tablet Take 1 tablet by mouth daily.       . ONE TOUCH LANCETS MISC by Does not apply route as directed. Check blood sugar 6 time per day       . predniSONE (DELTASONE) 20 MG tablet Take 1 tablet (20 mg total) by mouth 2 (two) times daily.  14 tablet  0  . rosuvastatin (CRESTOR) 5 MG tablet Take 5 mg by mouth. 1 by mouth three  times week (M/W/F)       No current facility-administered medications for this visit.    Allergies:    Allergies  Allergen Reactions  . Contrast Media [Iodinated Diagnostic Agents]     Redness and warm sensation     Social History:  The patient  reports that he quit smoking about 49 years ago. He does not have any smokeless tobacco history on file. He reports that he drinks about 1.2 ounces of alcohol per week. He reports that he does not use illicit drugs.   ROS:  Please see the history of present illness.      All other systems reviewed and negative.   OBJECTIVE: VS:  BP 114/62  Pulse 73  Ht 5\' 9"  (1.753 m)  Wt 206 lb 12.8 oz (93.804 kg)  BMI 30.53 kg/m2 Well nourished, well developed, in no acute distress, obese HEENT: normal Neck: JVD flat. Carotid bruit absent  Cardiac:  normal S1, S2; RRR; no murmur Lungs:  clear to auscultation bilaterally, no wheezing, rhonchi or rales Abd: soft, nontender, no hepatomegaly Ext: Edema absent. Pulses 2+ Skin: warm and dry Neuro:  CNs 2-12 intact, no focal abnormalities noted  EKG:  Left bundle branch block with normal sinus rhythm       Signed, Darci Needle III, MD 11/27/2013 4:38 PM

## 2013-11-27 NOTE — Patient Instructions (Signed)
Your physician recommends that you continue on your current medications as directed. Please refer to the Current Medication list given to you today.  Your physician wants you to follow-up in: 1 year. You will receive a reminder letter in the mail two months in advance. If you don't receive a letter, please call our office to schedule the follow-up appointment.  

## 2013-11-28 ENCOUNTER — Encounter: Payer: Self-pay | Admitting: Interventional Cardiology

## 2013-11-28 ENCOUNTER — Other Ambulatory Visit: Payer: Self-pay

## 2013-11-28 LAB — NMR LIPOPROFILE WITH LIPIDS
HDL Size: 8.2 nm — ABNORMAL LOW (ref >=9.2)
HDL-C: 38 mg/dL — ABNORMAL LOW (ref >=40)
LDL (calc): 91 mg/dL (ref <100)
LDL Particle Number: 1328 nmol/L — ABNORMAL HIGH (ref <1000)
LDL Size: 20.3 nm — ABNORMAL LOW (ref >20.5)
LP-IR Score: 72 — ABNORMAL HIGH (ref <=45)
Small LDL Particle Number: 821 nmol/L — ABNORMAL HIGH (ref <=527)
VLDL Size: 48.5 nm — ABNORMAL HIGH (ref <=46.6)

## 2013-11-28 MED ORDER — ROSUVASTATIN CALCIUM 5 MG PO TABS: 5.0000 mg | ORAL_TABLET | ORAL | Status: DC

## 2013-11-29 ENCOUNTER — Ambulatory Visit: Payer: Medicare Other | Admitting: Pharmacist

## 2013-12-02 ENCOUNTER — Ambulatory Visit (INDEPENDENT_AMBULATORY_CARE_PROVIDER_SITE_OTHER): Payer: Medicare Other | Admitting: Family Medicine

## 2013-12-02 VITALS — BP 130/66 | HR 72 | Temp 97.6°F | Resp 16 | Ht 69.5 in | Wt 209.4 lb

## 2013-12-02 DIAGNOSIS — Z20828 Contact with and (suspected) exposure to other viral communicable diseases: Secondary | ICD-10-CM

## 2013-12-02 MED ORDER — OSELTAMIVIR PHOSPHATE 75 MG PO CAPS: 75.0000 mg | ORAL_CAPSULE | Freq: Every day | ORAL | Status: DC

## 2013-12-02 NOTE — Progress Notes (Signed)
Chief Complaint:  Chief Complaint  Patient presents with  . MEDICATION    TAMIFLU    HPI: Jeffery [name] is a 72 y.o. male who is here for  Flu exposure. He denies any symptoms. He has heart issues and asthma and does not want to get the flu. He has an old rx for tamiflu that his cardiologist had given him as a ppx for anytime he is exposed to the flu but it is expired.   He has been exposed to his granddaughter who was dx with influenza A, she was dx yesterday, she is visiting from [place].    Past Medical History  Diagnosis Date  . Asthma   . CAD (coronary artery disease)   . LBBB (left bundle branch block) 1999  . GERD (gastroesophageal reflux disease)   . Seasonal allergies   . HTN (hypertension)   . Hyperlipidemia    Past Surgical History  Procedure Laterality Date  . Cardiac catheterization  2003    Dr Garnette Scheuermann; 85 % R circumflex obstruction  . Inguinal hernia repair      right, age 41  . Inguinal hernia repair      left age 66  . Lumbar laminectomy      age 71  . Tonsillectomy      age 36   History   Social History  . Marital Status: Married    Spouse Name: N/A    Number of Children: N/A  . Years of Education: N/A   Social History Main Topics  . Smoking status: Former Smoker    Quit date: 12/14/1963  . Smokeless tobacco: None  . Alcohol Use: 1.2 oz/week    2 Glasses of wine per week     Comment: socially  . Drug Use: No  . Sexual Activity:    Other Topics Concern  . None   Social History Narrative  . None   Family History  Problem Relation Age of Onset  . Heart attack Father     10s  . Heart failure Mother     90s  . Subarachnoid hemorrhage Mother 66  . Hypertension Mother   . Subarachnoid hemorrhage Paternal Grandfather 75  . Diabetes Maternal Aunt    Allergies  Allergen Reactions  . Contrast Media [Iodinated Diagnostic Agents]     Redness and warm sensation    Prior to Admission medications   Medication Sig Start Date  End Date Taking? Authorizing Provider  albuterol (PROAIR HFA) 108 (90 BASE) MCG/ACT inhaler Inhale 2 puffs into the lungs as needed.     Yes Historical Provider, MD  aspirin 81 MG tablet Take 81 mg by mouth at bedtime.     Yes Historical Provider, MD  cefUROXime (CEFTIN) 500 MG tablet Take 1 tablet (500 mg total) by mouth 2 (two) times daily. 08/27/13  Yes Pecola Lawless, MD  Coenzyme Q10 (COQ10) 100 MG CAPS Take 200 mg by mouth daily.     Yes Historical Provider, MD  esomeprazole (NEXIUM) 40 MG capsule Take 1 capsule (40 mg total) by mouth daily. 02/27/13  Yes Pecola Lawless, MD  fexofenadine (ALLEGRA) 180 MG tablet Take 180 mg by mouth daily as needed.     Yes Historical Provider, MD  glucose blood (ONE TOUCH TEST STRIPS) test strip Check blood sugar daily as directed DX. 250.00 02/27/13  Yes Pecola Lawless, MD  levalbuterol [facility]) 1.25 MG/3ML nebulizer solution Take 1 ampule by nebulization as needed. Use in  nebulizer prn    Yes Historical Provider, MD  LEVITRA 20 MG tablet TAKE 1 TABLET (20 MG TOTAL) BY MOUTH AS DIRECTED. CAN NOT BE TAKENWITH NITROGLYCERIN 01/06/13  Yes Pecola Lawless, MD  lisinopril-hydrochlorothiazide [facility]) 20-12.5 MG per tablet Take one tablet by mouth one time daily 11/27/13  Yes Lyn Records III, MD  LORazepam (ATIVAN) 1 MG tablet TAKE ONE-HALF TO ONE TABLET BY MOUTH AT BEDTIME AS NEEDED  11/07/13  Yes Pecola Lawless, MD  mometasone-formoterol [facility]) 200-5 MCG/ACT AERO Inhale 2 puffs into the lungs 2 (two) times daily. 08/08/13  Yes Pecola Lawless, MD  montelukast (SINGULAIR) 10 MG tablet Take 1 tablet (10 mg total) by mouth daily. 11/07/13  Yes Pecola Lawless, MD  Multiple Vitamin (MULTIVITAMIN) tablet Take 1 tablet by mouth daily.    Yes Historical Provider, MD  nebivolol (BYSTOLIC) 5 MG tablet Take one tablet by mouth one time daily 11/27/13  Yes Lesleigh Noe, MD  ONE [facility] LANCETS MISC by Does not apply route as directed. Check blood  sugar 6 time per day    Yes Historical Provider, MD  rosuvastatin (CRESTOR) 5 MG tablet Take 1 tablet (5 mg total) by mouth 3 (three) times a week. 1 by mouth  (M/W/F) 11/28/13  Yes Lesleigh Noe, MD  HYDROcodone-acetaminophen [facility]) 7.5-325 MG per tablet Take 1 tablet by mouth every 4 (four) hours as needed. 11/06/13   Pecola Lawless, MD  predniSONE (DELTASONE) 20 MG tablet Take 1 tablet (20 mg total) by mouth 2 (two) times daily. 08/27/13   Pecola Lawless, MD     ROS: The patient denies fevers, chills, night sweats, unintentional weight loss, chest pain, palpitations, wheezing, dyspnea on exertion, nausea, vomiting, abdominal pain, dysuria, hematuria, melena, numbness, weakness, or tingling.   All other systems have been reviewed and were otherwise negative with the exception of those mentioned in the HPI and as above.    PHYSICAL EXAM: Filed Vitals:   12/02/13 1716  BP: 130/66  Pulse: 72  Temp: 97.6 F (36.4 C)  Resp: 16   Filed Vitals:   12/02/13 1716  Height: 5' 9.5" (1.765 m)  Weight: 209 lb 6.4 oz (94.983 kg)   Body mass index is 30.49 kg/(m^2).  General: Alert, no acute distress HEENT:  Normocephalic, atraumatic, oropharynx patent. EOMI, PERRLA Cardiovascular:  Regular rate and rhythm, no rubs murmurs or gallops.  No Carotid bruits, radial pulse intact. No pedal edema.  Respiratory: Clear to auscultation bilaterally.  No wheezes, rales, or rhonchi.  No cyanosis, no use of accessory musculature GI: No organomegaly, abdomen is soft and non-tender, positive bowel sounds.  No masses. Skin: No rashes. Neurologic: Facial musculature symmetric. Psychiatric: Patient is appropriate throughout our interaction. Lymphatic: No cervical lymphadenopathy Musculoskeletal: Gait intact.   LABS: Results for orders placed in visit on 11/27/13  NMR LIPOPROFILE WITH LIPIDS      Result Value Range   LDL Particle Number [id number] (*) <1000 nmol/L   LDL (calc) 91  <100 mg/dL   HDL-C 38  (*) >=16 mg/dL   Triglycerides 109  <604 mg/dL   Cholesterol, Total 540  <200 mg/dL   HDL Particle Number 98.1 (*) >=30.5 umol/L   Large HDL-P <1.3 (*) >=4.8 umol/L   Large VLDL-P 2.9 (*) <=2.7 nmol/L   Small LDL Particle Number 821 (*) <=527 nmol/L   LDL Size 20.3 (*) >20.5 nm   HDL Size 8.2 (*) >=9.2 nm   VLDL Size  48.5 (*) <=46.6 nm   LP-IR Score 72 (*) <=45  HEPATIC FUNCTION PANEL      Result Value Range   Total Bilirubin 0.6  0.3 - 1.2 mg/dL   Bilirubin, Direct 0.0  0.0 - 0.3 mg/dL   Alkaline Phosphatase 73  39 - 117 U/L   AST 34  0 - 37 U/L   ALT 44  0 - 53 U/L   Total Protein 7.3  6.0 - 8.3 g/dL   Albumin 4.2  3.5 - 5.2 g/dL     EKG/XRAY:   Primary read interpreted by Dr. Conley Rolls at [facility].   ASSESSMENT/PLAN: Encounter Diagnosis  Name Primary?  . Exposure to the flu Yes   Rx Tamiflu 75 mg daily x 10 days for flu exposure F/u prn  Gross sideeffects, risk and benefits, and alternatives of medications d/w patient. Patient is aware that all medications have potential sideeffects and we are unable to predict every sideeffect or drug-drug interaction that may occur.  Takeila Thayne PHUONG, DO 12/05/2013 6:50 AM

## 2013-12-10 ENCOUNTER — Telehealth: Payer: Self-pay

## 2013-12-10 NOTE — Telephone Encounter (Signed)
called pt to give pt NMR lipid numbers  and ask what anti-lipid regimen has the pt been taking. pt sts that he is currently taking Crestor 5mg  3 times a week, and has not been compliant with taking welchol.pt sts that he has just not been able to fit it into his regular medication regimen.pt sts that he will try to do better with taking it and he will keep his upcoming appt with Riki Rusk smart Pharm-D on 12/21/13

## 2013-12-10 NOTE — Telephone Encounter (Signed)
Message copied by Jarvis Newcomer on Mon Dec 10, 2013  4:05 PM ------      Message from: SMART, Gaspar Skeeters      Created: Thu Nov 29, 2013  4:47 PM       Patient was suppose to see me today, but he rescheduled until 12/2013.  His LDL-P is up from 850 to 1328 over past 14 months (goal < 1000).  His LDL-P was previously well controlled on Crestor 10 mg three times per week, and Welchol 3.75 grams five days of the week.  He has gained 6 lbs since that time as well it seems.        Intolerant:  Crestor 5 mg daily, Lipitor, Zetia, Vytorin (myalgias).  He never tried pravastatin, niaspan, or fenofibrate.            It appears patient is no longer on Welchol.  Please find out if patient is really not taking Welchol anymore, and if he isn't please find out why (cost, ran out, side effect?).  Please let me know what patient says.  Thank you! ------

## 2013-12-21 ENCOUNTER — Ambulatory Visit (INDEPENDENT_AMBULATORY_CARE_PROVIDER_SITE_OTHER): Payer: Medicare Other | Admitting: Pharmacist

## 2013-12-21 VITALS — Wt 211.0 lb

## 2013-12-21 DIAGNOSIS — E785 Hyperlipidemia, unspecified: Secondary | ICD-10-CM

## 2013-12-21 DIAGNOSIS — Z79899 Other long term (current) drug therapy: Secondary | ICD-10-CM

## 2013-12-21 NOTE — Patient Instructions (Signed)
1.  Continue Crestor 10 mg three times weekly. 2.  Get back on Welchol 3.75 grams daily. 3.  Continue low fat diet efforts. 4.  Try to use treadmill in house until able to get back to the gym. 5.  Weight loss efforts. 6.  Recheck cholesterol in 6 months (7/10 - fasting) and see me 06/25/14 at 10:30 to discuss.

## 2013-12-21 NOTE — Progress Notes (Signed)
Patient referred to lipid clinic Dr. Tamala Julian years ago, and patient is a prior patient of mine from [facility]. Patient has a h/o CAD.  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  Their daughter is a single mother and patient and his wife care for the now 80 and 33/85 month old child during the day.  Due to this task of helping raise this child, patient has no longer been able to go to the gym (previously was going 3-4 days/week).  His diet has suffered somewhat as well as he has to eat food quickly, and hasn't been able to prepare healthy meals as he was going in the past.  He has gained 11 lbs over the past 6 months due to this lifestyle.  His Welchol wasn't giving him problems, he just stopped taking it for no reason on his own.  Patient restarted Welchol on his own last week after seeing his cholesterol results online.  Risk Factors:  CAD, HTN, age, low HDL - LDL goal < 70, non-HDL goal < 100, LDL-P goal < 1000 Meds:  Crestor 10 mg tiw, fish oil 2 g/d, and restarted Welchol 3.75 g daily one week ago (his last blood work on 11/2013 was with patient off Welchol) Intolerant:  Crestor 5 mg daily (myalgias), Lipitor, Vytorin, Zetia.  He hasn't tried pravastatin but wants to stay on current regimen as it worked well for him historically when he was compliant with meds and diet.  Diet:  Still not eating out, and still only buying organic foods that are free of GMOs.  No sodas either.  His problem has been meal planning since he and his wife are 46 y.o. And now caring for a 70 month old.  They seem to eat when possible, and this has lead to unhealthier food options.  He has been doing more snacking around the house due to time restraints.  He drinks lots of water. Exercise:  Previously working out at [employer] 3-4 days per week, but over  past few months hasn't been able to work out at all with the baby.  He tells me he has a treadmill in his house and will try to use this more now that his weight and cholesterol are moving up.    Labs: 11/2013:  LDL-P 1328, LDL 91, HDL 38, TG 112, TC 151  (Crestor 10 mg tiw, fish oil 2 g/d;  Was off Welchol for a few months on his own, Weight 211 lbs) 07/2013:  LDL-P 850, LDL 82, HDL 43, TG 117, TC 148 (Crestor 10 mg tiw, fish oil 2 g/d, Welchol 3.75 g qd, Weight 200 lbs)  Current Outpatient Prescriptions  Medication Sig Dispense Refill  . Colesevelam HCl 3.75 G PACK Take 3.75 g by mouth daily. Mix 1 packet with 4-8 ounces of water or juice and drink daily      . rosuvastatin (CRESTOR) 10 MG tablet Take 10 mg by mouth. Take 1 tablet every Monday, Wednesday, and Friday      . albuterol (PROAIR HFA) 108 (90 BASE) MCG/ACT inhaler Inhale 2 puffs into the lungs as needed.        [name] aspirin 81 MG tablet Take 81 mg by mouth at bedtime.        . Coenzyme Q10 (COQ10) 100 MG CAPS Take 200 mg by  mouth daily.        [name] esomeprazole (NEXIUM) 40 MG capsule Take 1 capsule (40 mg total) by mouth daily.  90 capsule  1  . fexofenadine (ALLEGRA) 180 MG tablet Take 180 mg by mouth daily as needed.        [name] glucose blood (ONE TOUCH TEST STRIPS) test strip Check blood sugar daily as directed DX. 250.00  100 each  3  . HYDROcodone-acetaminophen (NORCO) 7.5-325 MG per tablet Take 1 tablet by mouth every 4 (four) hours as needed.  30 tablet  0  . levalbuterol (XOPENEX) 1.25 MG/3ML nebulizer solution Take 1 ampule by nebulization as needed. Use in nebulizer prn       . LEVITRA 20 MG tablet TAKE 1 TABLET (20 MG TOTAL) BY MOUTH AS DIRECTED. CAN NOT BE TAKENWITH NITROGLYCERIN  6 tablet  5  . lisinopril-hydrochlorothiazide (PRINZIDE,ZESTORETIC) 20-12.5 MG per tablet Take one tablet by mouth one time daily  90 tablet  3  . LORazepam (ATIVAN) 1 MG tablet TAKE ONE-HALF TO ONE TABLET BY MOUTH AT BEDTIME AS NEEDED   30 tablet  0  .  mometasone-formoterol (DULERA) 200-5 MCG/ACT AERO Inhale 2 puffs into the lungs 2 (two) times daily.  13 g  3  . montelukast (SINGULAIR) 10 MG tablet Take 1 tablet (10 mg total) by mouth daily.  90 tablet  3  . Multiple Vitamin (MULTIVITAMIN) tablet Take 1 tablet by mouth daily.       . nebivolol (BYSTOLIC) 5 MG tablet Take one tablet by mouth one time daily  90 tablet  3  . ONE TOUCH LANCETS MISC by Does not apply route as directed. Check blood sugar 6 time per day       . oseltamivir (TAMIFLU) 75 MG capsule Take 1 capsule (75 mg total) by mouth daily.  10 capsule  0  . predniSONE (DELTASONE) 20 MG tablet Take 1 tablet (20 mg total) by mouth 2 (two) times daily.  14 tablet  0   No current facility-administered medications for this visit.   Allergies  Allergen Reactions  . Contrast Media [Iodinated Diagnostic Agents]     Redness and warm sensation    Family History  Problem Relation Age of Onset  . Heart attack Father     60s  . Heart failure Mother     90s  . Subarachnoid hemorrhage Mother 19  . Hypertension Mother   . Subarachnoid hemorrhage Paternal Grandfather 61  . Diabetes Maternal Aunt

## 2013-12-21 NOTE — Assessment & Plan Note (Addendum)
Patient's cholesterol was well controlled in 07/2013 when his weight was down to 200 lbs, he was working out 3 days a week, and he was compliant with Welchol daily and Crestor tiw.  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  He also hasn't been compliant with Welchol prior to his last lab work, but he tells me he got back on this 2 weeks ago (after labs) and doing well on this since then.  Patient will try to use the treadmill in his house to exercise as leaving to go to the gym isn't an option at this time.  He is going to try and get his weight back to ~ 200-205 lbs, and he is agreeable to continue both Crestor tiw and Welchol daily.  Hopefully cholesterol will trend back down again on this regimen since it worked in the past.  Will recheck NMR / LFTs in 6 months. Plan:  1.  Continue Crestor 10 mg three times weekly. 2.  Get back on Welchol 3.75 grams daily. 3.  Continue low fat diet efforts. 4.  Try to use treadmill in house until able to get back to the gym. 5.  Weight loss efforts. 6.  Recheck cholesterol in 6 months (7/10 - fasting) and see me 06/25/14 at 10:30 to discuss.

## 2014-01-02 ENCOUNTER — Other Ambulatory Visit: Payer: Self-pay | Admitting: Internal Medicine

## 2014-01-02 ENCOUNTER — Other Ambulatory Visit: Payer: Self-pay | Admitting: *Deleted

## 2014-01-02 MED ORDER — LORAZEPAM 1 MG PO TABS: ORAL_TABLET | ORAL | Status: DC

## 2014-01-02 NOTE — Telephone Encounter (Signed)
OK X1 

## 2014-01-02 NOTE — Telephone Encounter (Signed)
LORazepam (ATIVAN) 1 MG tablet Last refill: 11/07/13 #30, 0 refills Last OV: 08/27/13 Low risk, UDS due

## 2014-01-02 NOTE — Telephone Encounter (Signed)
Called and left message for patient informing him that script is ready for pick up. UDS due. JG//CMA

## 2014-01-08 ENCOUNTER — Other Ambulatory Visit: Payer: Self-pay | Admitting: Internal Medicine

## 2014-01-08 NOTE — Telephone Encounter (Signed)
Dulera refilled per protocol. JG//CMA

## 2014-02-27 ENCOUNTER — Other Ambulatory Visit: Payer: Self-pay | Admitting: Internal Medicine

## 2014-02-28 ENCOUNTER — Other Ambulatory Visit: Payer: Self-pay | Admitting: Internal Medicine

## 2014-02-28 NOTE — Telephone Encounter (Signed)
Faxed script back to [facility]...Jeffery [name]

## 2014-03-01 ENCOUNTER — Other Ambulatory Visit: Payer: Self-pay | Admitting: Internal Medicine

## 2014-03-01 NOTE — Telephone Encounter (Signed)
Rx denied was filled on (02-28-14).//AB/CMA

## 2014-03-18 ENCOUNTER — Ambulatory Visit (INDEPENDENT_AMBULATORY_CARE_PROVIDER_SITE_OTHER): Payer: Medicare Other | Admitting: Physician Assistant

## 2014-03-18 ENCOUNTER — Encounter: Payer: Self-pay | Admitting: Physician Assistant

## 2014-03-18 VITALS — BP 109/72 | HR 72 | Temp 98.3°F | Wt 210.0 lb

## 2014-03-18 DIAGNOSIS — J45901 Unspecified asthma with (acute) exacerbation: Secondary | ICD-10-CM

## 2014-03-18 DIAGNOSIS — J209 Acute bronchitis, unspecified: Secondary | ICD-10-CM

## 2014-03-18 MED ORDER — CEFUROXIME AXETIL 500 MG PO TABS: 500.0000 mg | ORAL_TABLET | Freq: Two times a day (BID) | ORAL | Status: DC

## 2014-03-18 MED ORDER — HYDROCOD POLST-CHLORPHEN POLST 10-8 MG/5ML PO LQCR: 5.0000 mL | Freq: Two times a day (BID) | ORAL | Status: DC | PRN

## 2014-03-18 MED ORDER — PREDNISONE 20 MG PO TABS: 40.0000 mg | ORAL_TABLET | Freq: Every day | ORAL | Status: DC

## 2014-03-18 NOTE — Progress Notes (Signed)
Patient presents to clinic today c/o 3-4 weeks of productive cough, fatigue, mod-severe wheezing and mild SOB.  Patient with history of asthma.  Endorses taking his Singulair, Xolpenex, Dulera,and albuterol as directed.  Denies recent travel or sick contact.  Denies fever or pleuritic chest pain.  Past Medical History  Diagnosis Date  . Asthma   . CAD (coronary artery disease)   . LBBB (left bundle branch block) 1999  . GERD (gastroesophageal reflux disease)   . Seasonal allergies   . HTN (hypertension)   . Hyperlipidemia    Current Outpatient Prescriptions on File Prior to Visit  Medication Sig Dispense Refill  . albuterol (PROAIR HFA) 108 (90 BASE) MCG/ACT inhaler Inhale 2 puffs into the lungs as needed.        [name] aspirin 81 MG tablet Take 81 mg by mouth at bedtime.        . Coenzyme Q10 (COQ10) 100 MG CAPS Take 200 mg by mouth daily.        . Colesevelam HCl 3.75 G PACK Take 3.75 g by mouth daily. Mix 1 packet with 4-8 ounces of water or juice and drink daily      . DULERA 200-5 MCG/ACT AERO INHALE TWO PUFFS BY MOUTH TWICE DAILY   13 g  2  . esomeprazole (NEXIUM) 40 MG capsule Take 1 capsule (40 mg total) by mouth daily.  90 capsule  1  . fexofenadine (ALLEGRA) 180 MG tablet Take 180 mg by mouth daily as needed.        . levalbuterol (XOPENEX) 1.25 MG/3ML nebulizer solution Take 1 ampule by nebulization as needed. Use in nebulizer prn       . LEVITRA 20 MG tablet TAKE 1 TABLET (20 MG TOTAL) BY MOUTH AS DIRECTED. CAN NOT BE TAKENWITH NITROGLYCERIN  6 tablet  5  . lisinopril-hydrochlorothiazide (PRINZIDE,ZESTORETIC) 20-12.5 MG per tablet Take one tablet by mouth one time daily  90 tablet  3  . LORazepam (ATIVAN) 1 MG tablet TAKE ONE-HALF TO ONE TABLET BY MOUTH AT BEDTIME AS NEEDED  30 tablet  0  . montelukast (SINGULAIR) 10 MG tablet Take 1 tablet (10 mg total) by mouth daily.  90 tablet  3  . Multiple Vitamin (MULTIVITAMIN) tablet Take 1 tablet by mouth daily.       . nebivolol (BYSTOLIC)  5 MG tablet Take one tablet by mouth one time daily  90 tablet  3  . ONE TOUCH LANCETS MISC by Does not apply route as directed. Check blood sugar 6 time per day       . rosuvastatin (CRESTOR) 10 MG tablet Take 10 mg by mouth. Take 1 tablet every Monday, Wednesday, and Friday      . HYDROcodone-acetaminophen (NORCO) 7.5-325 MG per tablet Take 1 tablet by mouth every 4 (four) hours as needed.  30 tablet  0   No current facility-administered medications on file prior to visit.    Allergies  Allergen Reactions  . Contrast Media [Iodinated Diagnostic Agents]     Redness and warm sensation     Family History  Problem Relation Age of Onset  . Heart attack Father     21s  . Heart failure Mother     90s  . Subarachnoid hemorrhage Mother 74  . Hypertension Mother   . Subarachnoid hemorrhage Paternal Grandfather 61  . Diabetes Maternal Aunt     History   Social History  . Marital Status: Married    Spouse Name: N/A  Number of Children: N/A  . Years of Education: N/A   Social History Main Topics  . Smoking status: Former Smoker    Quit date: 12/14/1963  . Smokeless tobacco: None  . Alcohol Use: 1.2 oz/week    2 Glasses of wine per week     Comment: socially  . Drug Use: No  . Sexual Activity:    Other Topics Concern  . None   Social History Narrative  . None   Review of Systems - See HPI.  All other ROS are negative.  BP 109/72  Pulse 72  Temp(Src) 98.3 F (36.8 C) (Oral)  Wt 210 lb (95.255 kg)  SpO2 96%  Physical Exam  Vitals reviewed. Constitutional: He is oriented to person, place, and time and well-developed, well-nourished, and in no distress.  HENT:  Head: Normocephalic and atraumatic.  Right Ear: External ear normal.  Left Ear: External ear normal.  Nose: Nose normal.  Mouth/Throat: Oropharynx is clear and moist. No oropharyngeal exudate.  TM within normal limits bilaterally.  Eyes: Conjunctivae are normal. Pupils are equal, round, and reactive to  light.  Neck: Neck supple.  Cardiovascular: Normal rate, regular rhythm, normal heart sounds and intact distal pulses.   Pulmonary/Chest: Effort normal. No respiratory distress. He has wheezes. He has no rales. He exhibits no tenderness.  Lymphadenopathy:    He has no cervical adenopathy.  Neurological: He is alert and oriented to person, place, and time.  Skin: Skin is warm and dry. No rash noted.  Psychiatric: Affect normal.   Assessment/Plan: Acute bronchitis with asthma with acute exacerbation Rx Prednisone 5-day burst.  Rx Cefuroxime BID x 7 days.  Rx Tussionex for cough.  Increase fluids.  Rest.  Saline nasal spray. Plain Mucinex.  Humidifier in bedroom.  Continue allergy and asthma medications as directed.  Call or return to clinic if symptoms not improving in 48-72 hours.

## 2014-03-18 NOTE — Assessment & Plan Note (Signed)
Rx Prednisone 5-day burst.  Rx Cefuroxime BID x 7 days.  Rx Tussionex for cough.  Increase fluids.  Rest.  Saline nasal spray. Plain Mucinex.  Humidifier in bedroom.  Continue allergy and asthma medications as directed.  Call or return to clinic if symptoms not improving in 48-72 hours.

## 2014-03-18 NOTE — Progress Notes (Signed)
Pre-visit discussion using our clinic review tool. No additional management support is needed unless otherwise documented below in the visit note.  

## 2014-03-18 NOTE — Patient Instructions (Addendum)
Take prednisone as directed.  Take antibiotic as directed with food.  Use Tussionex for cough.  Continue daily asthma medications and supportive measures.  Place a humidifier in the bedroom.  Call or return to clinic if symptoms are not improving.

## 2014-04-07 ENCOUNTER — Other Ambulatory Visit: Payer: Self-pay | Admitting: Internal Medicine

## 2014-04-08 ENCOUNTER — Other Ambulatory Visit: Payer: Self-pay

## 2014-04-08 MED ORDER — HYDROCODONE-ACETAMINOPHEN 7.5-325 MG PO TABS: 1.0000 | ORAL_TABLET | ORAL | Status: DC | PRN

## 2014-04-24 ENCOUNTER — Other Ambulatory Visit: Payer: Self-pay | Admitting: Internal Medicine

## 2014-04-24 NOTE — Telephone Encounter (Signed)
Last ov with you on 08/2013

## 2014-04-24 NOTE — Telephone Encounter (Signed)
OK X1 

## 2014-04-29 ENCOUNTER — Other Ambulatory Visit: Payer: Self-pay | Admitting: Internal Medicine

## 2014-06-13 ENCOUNTER — Other Ambulatory Visit: Payer: Self-pay | Admitting: Internal Medicine

## 2014-06-13 NOTE — Telephone Encounter (Signed)
OK X1 

## 2014-06-14 ENCOUNTER — Other Ambulatory Visit: Payer: Self-pay | Admitting: Internal Medicine

## 2014-06-21 ENCOUNTER — Other Ambulatory Visit: Payer: Medicare Other

## 2014-06-25 ENCOUNTER — Ambulatory Visit: Payer: Medicare Other | Admitting: Pharmacist

## 2014-07-03 ENCOUNTER — Other Ambulatory Visit: Payer: Self-pay | Admitting: Internal Medicine

## 2014-07-03 MED ORDER — HYDROCODONE-ACETAMINOPHEN 7.5-325 MG PO TABS: 1.0000 | ORAL_TABLET | ORAL | Status: DC | PRN

## 2014-07-08 ENCOUNTER — Other Ambulatory Visit: Payer: Medicare Other

## 2014-07-09 ENCOUNTER — Ambulatory Visit: Payer: Medicare Other | Admitting: Pharmacist

## 2014-08-05 ENCOUNTER — Other Ambulatory Visit: Payer: Self-pay | Admitting: Internal Medicine

## 2014-08-05 NOTE — Telephone Encounter (Signed)
08/27/13 last seen by you  06/13/14 med last filled for #30

## 2014-08-05 NOTE — Telephone Encounter (Signed)
Lorazepam has been called to pharmacy

## 2014-08-05 NOTE — Telephone Encounter (Signed)
  OK X1 but additional refills require office visit to update medical history. Last OV 9/14

## 2014-08-16 ENCOUNTER — Other Ambulatory Visit: Payer: Medicare Other

## 2014-08-20 ENCOUNTER — Other Ambulatory Visit (INDEPENDENT_AMBULATORY_CARE_PROVIDER_SITE_OTHER): Payer: Medicare Other

## 2014-08-20 DIAGNOSIS — Z79899 Other long term (current) drug therapy: Secondary | ICD-10-CM

## 2014-08-20 DIAGNOSIS — E785 Hyperlipidemia, unspecified: Secondary | ICD-10-CM

## 2014-08-20 LAB — HEPATIC FUNCTION PANEL
ALBUMIN: 3.6 g/dL (ref 3.5–5.2)
ALT: 30 U/L (ref 0–53)
AST: 29 U/L (ref 0–37)
Alkaline Phosphatase: 63 U/L (ref 39–117)
Bilirubin, Direct: 0 mg/dL (ref 0.0–0.3)
Total Bilirubin: 0.4 mg/dL (ref 0.2–1.2)
Total Protein: 6.6 g/dL (ref 6.0–8.3)

## 2014-08-21 ENCOUNTER — Ambulatory Visit: Payer: Medicare Other | Admitting: Pharmacist

## 2014-08-22 LAB — NMR LIPOPROFILE WITH LIPIDS
Cholesterol, Total: 143 mg/dL (ref 100–199)
HDL Particle Number: 28.2 umol/L — ABNORMAL LOW (ref >=30.5)
HDL SIZE: 8.4 nm — AB (ref >=9.2)
HDL-C: 33 mg/dL — ABNORMAL LOW (ref >39)
LARGE VLDL-P: 5.3 nmol/L — AB (ref <=2.7)
LDL (calc): 81 mg/dL (ref 0–99)
LDL PARTICLE NUMBER: 1174 nmol/L — AB (ref <1000)
LDL SIZE: 20.7 nm (ref >=20.8)
LP-IR Score: 75 — ABNORMAL HIGH (ref <=45)
SMALL LDL PARTICLE NUMBER: 703 nmol/L — AB (ref <=527)
Triglycerides: 145 mg/dL (ref 0–149)
VLDL Size: 49.4 nm — ABNORMAL HIGH (ref <=46.6)

## 2014-08-23 ENCOUNTER — Other Ambulatory Visit: Payer: Self-pay | Admitting: Pharmacist

## 2014-08-23 DIAGNOSIS — Z79899 Other long term (current) drug therapy: Secondary | ICD-10-CM

## 2014-08-23 DIAGNOSIS — E785 Hyperlipidemia, unspecified: Secondary | ICD-10-CM

## 2014-09-18 ENCOUNTER — Other Ambulatory Visit: Payer: Self-pay | Admitting: Internal Medicine

## 2014-09-18 MED ORDER — LORAZEPAM 1 MG PO TABS: ORAL_TABLET | ORAL | Status: DC

## 2014-09-24 ENCOUNTER — Telehealth: Payer: Self-pay | Admitting: Interventional Cardiology

## 2014-09-24 NOTE — Telephone Encounter (Signed)
returned pt call. adv him that Dr.Smith will return to the office on 10/20. I will attempt to get  pt schedule with an extender for cardiac clearnce for Carpel tunnel sx with possible sedation. pt verbalized understanding.

## 2014-09-24 NOTE — Telephone Encounter (Signed)
New message      Pt is having carpel tunnel surgery on oct 30.  He thinks he needs a stress test to be cleared.  Do you think he needs clearance?

## 2014-09-25 NOTE — Telephone Encounter (Signed)
called pt to adv him of his appt with S.Weaver, PA for cardiac clearance on 10/15 @ 2:40pm. pt had originally rqst a Thurs. appt,but sts that he will be unable to make it this wk, he is babysitiing his grandkids, next wk would be better. adv him I will ck the providers schedule for next wk and call back with availability. Pt verbalized understanding.Pt rqst any day except Wed.

## 2014-09-26 ENCOUNTER — Ambulatory Visit: Payer: Medicare Other | Admitting: Physician Assistant

## 2014-09-27 ENCOUNTER — Ambulatory Visit (INDEPENDENT_AMBULATORY_CARE_PROVIDER_SITE_OTHER): Payer: Medicare Other | Admitting: Internal Medicine

## 2014-09-27 ENCOUNTER — Encounter: Payer: Self-pay | Admitting: Internal Medicine

## 2014-09-27 ENCOUNTER — Other Ambulatory Visit (INDEPENDENT_AMBULATORY_CARE_PROVIDER_SITE_OTHER): Payer: Medicare Other

## 2014-09-27 VITALS — BP 124/88 | HR 59 | Temp 97.7°F | Resp 12 | Ht 69.25 in | Wt 206.4 lb

## 2014-09-27 DIAGNOSIS — G5602 Carpal tunnel syndrome, left upper limb: Secondary | ICD-10-CM

## 2014-09-27 DIAGNOSIS — G5601 Carpal tunnel syndrome, right upper limb: Secondary | ICD-10-CM

## 2014-09-27 DIAGNOSIS — E1159 Type 2 diabetes mellitus with other circulatory complications: Secondary | ICD-10-CM

## 2014-09-27 DIAGNOSIS — E782 Mixed hyperlipidemia: Secondary | ICD-10-CM

## 2014-09-27 DIAGNOSIS — G5600 Carpal tunnel syndrome, unspecified upper limb: Secondary | ICD-10-CM | POA: Insufficient documentation

## 2014-09-27 DIAGNOSIS — E1151 Type 2 diabetes mellitus with diabetic peripheral angiopathy without gangrene: Secondary | ICD-10-CM

## 2014-09-27 DIAGNOSIS — G5603 Carpal tunnel syndrome, bilateral upper limbs: Secondary | ICD-10-CM

## 2014-09-27 LAB — BASIC METABOLIC PANEL
BUN: 22 mg/dL (ref 6–23)
CO2: 23 mEq/L (ref 19–32)
CREATININE: 1.2 mg/dL (ref 0.4–1.5)
Calcium: 9.4 mg/dL (ref 8.4–10.5)
Chloride: 100 mEq/L (ref 96–112)
GFR: 65.29 mL/min (ref >60.00)
GLUCOSE: 120 mg/dL — AB (ref 70–99)
Potassium: 4.7 mEq/L (ref 3.5–5.1)
Sodium: 136 mEq/L (ref 135–145)

## 2014-09-27 LAB — TSH: TSH: 1.47 u[IU]/mL (ref 0.35–4.50)

## 2014-09-27 LAB — MICROALBUMIN / CREATININE URINE RATIO
Creatinine,U: 60.3 mg/dL
Microalb Creat Ratio: 0.7 mg/g (ref 0.0–30.0)
Microalb, Ur: 0.4 mg/dL (ref 0.0–1.9)

## 2014-09-27 LAB — HEMOGLOBIN A1C: HEMOGLOBIN A1C: 7 % — AB (ref 4.6–6.5)

## 2014-09-27 MED ORDER — LORAZEPAM 1 MG PO TABS: ORAL_TABLET | ORAL | Status: DC

## 2014-09-27 MED ORDER — HYDROCODONE-ACETAMINOPHEN 7.5-325 MG PO TABS: ORAL_TABLET | ORAL | Status: DC

## 2014-09-27 NOTE — Assessment & Plan Note (Signed)
A1c , urine microalbumin, BMET 

## 2014-09-27 NOTE — Progress Notes (Signed)
Pre visit review using our clinic review tool, if applicable. No additional management support is needed unless otherwise documented below in the visit note. 

## 2014-09-27 NOTE — Patient Instructions (Signed)
To prevent sleep dysfunction follow these instructions for sleep hygiene. Do not read, watch TV, or eat in bed. Do not get into bed until you are ready to turn off the light &  to go to sleep. Do not ingest stimulants ( decongestants, diet pills, nicotine, caffeine) after the evening meal.Do not take daytime naps.Cardiovascular exercise, this can be as simple a program as walking, is recommended 30-45 minutes 3-4 times per week. If you're not exercising you should take 6-8 weeks to build up to this level.  Your next office appointment will be determined based upon review of your pending labs . Those instructions will be transmitted to you through My Chart .

## 2014-09-27 NOTE — Assessment & Plan Note (Signed)
TSH 

## 2014-09-27 NOTE — Progress Notes (Signed)
   Subjective:    Patient ID: Jeffery [name], male    DOB: Feb 12, 1941, 73 y.o.   MRN: [id number]  HPI  He is here for followup of multiple health issues  He does have exercise-induced bronchospasm. He rarely uses his rescue inhaler. He does not wake up at night short of breath. He has been on maintenance Advair 2 puffs twice a day.  His blood pressure meds and lipid meds are prescribed by his cardiologist.  His blood pressure ranges 110-124/55-68. He is walking 1-1.5 miles most days without symptoms  He is on a heart healthy no added salt diet  He will have a stress test in the near future as a prelude to possible bilateral hand surgery for carpal tunnel . There is no recent TSH on record.  He's also due for a colonoscopy. Once he receives cardiology clearance he will schedule that. He has no active GI symptoms.  He has been taking narcotic pain medicine on average one half every 2-3 days.  He also takes less than a half a pill of Lorazepam 5/7 nights. He states this helps him go to sleep & "shut his brain down."    Review of Systems   Chest pain, palpitations, tachycardia, exertional dyspnea, paroxysmal nocturnal dyspnea, claudication or edema are absent.  Unexplained weight loss, abdominal pain, significant dyspepsia, dysphagia, melena, rectal bleeding, or persistently small caliber stools are denied.     Objective:   Physical Exam   Positive or pertinent findings include: He has pattern alopecia; mustache. Heart sounds are markedly distant. Heart rhythm and rate appear regular. His most recent EKG 11/27/13 by Dr. Tamala Julian revealed normal sinus rhythm & left bundle branch block.  He appears healthy and well-nourished & in no acute distress No carotid bruits are present.No neck vein distention present at 10 - 15 degrees. Thyroid normal to palpation Chest is clear with no increased work of breathing There is no evidence of aortic aneurysm or renal artery bruits Abdomen soft with  no organomegaly or masses. No HJR No clubbing, cyanosis or edema present. Pedal pulses are intact  No ischemic skin changes are present . Fingernails healthy  Alert and oriented. Strength, tone, DTRs reflexes normal          Assessment & Plan:  See Current Assessment & Plan in Problem List under specific Diagnosis

## 2014-09-28 ENCOUNTER — Other Ambulatory Visit: Payer: Self-pay | Admitting: Internal Medicine

## 2014-09-28 DIAGNOSIS — E1159 Type 2 diabetes mellitus with other circulatory complications: Secondary | ICD-10-CM

## 2014-09-28 MED ORDER — METFORMIN HCL 500 MG PO TABS: ORAL_TABLET | ORAL | Status: DC

## 2014-10-03 ENCOUNTER — Encounter (HOSPITAL_BASED_OUTPATIENT_CLINIC_OR_DEPARTMENT_OTHER): Payer: Self-pay | Admitting: *Deleted

## 2014-10-03 ENCOUNTER — Encounter: Payer: Self-pay | Admitting: Internal Medicine

## 2014-10-03 DIAGNOSIS — G4733 Obstructive sleep apnea (adult) (pediatric): Secondary | ICD-10-CM | POA: Insufficient documentation

## 2014-10-03 NOTE — Progress Notes (Signed)
Pt retired CRNA-hx asthma,chronic LBBB Used to see dr smith-last work ups negative-only has to see prn-recent labs and ekg pcp-no cp-heart problems-reviewed with dr Holley Dexter need for cardiac clearance-

## 2014-10-03 NOTE — Progress Notes (Signed)
10/03/14 1443  OBSTRUCTIVE SLEEP APNEA  Have you ever been diagnosed with sleep apnea through a sleep study? No  Do you snore loudly (loud enough to be heard through closed doors)?  1  Do you often feel tired, fatigued, or sleepy during the daytime? 0  Has anyone observed you stop breathing during your sleep? 1  Do you have, or are you being treated for high blood pressure? 1  BMI more than 35 kg/m2? 0  Age over 71 years old? 1  Neck circumference greater than 40 cm/16 inches? 1  Gender: 1  Obstructive Sleep Apnea Score 6  Score 4 or greater  Results sent to PCP

## 2014-10-04 ENCOUNTER — Telehealth: Payer: Self-pay

## 2014-10-04 ENCOUNTER — Telehealth: Payer: Self-pay | Admitting: Internal Medicine

## 2014-10-04 NOTE — Telephone Encounter (Signed)
Patient stated that a medication Metformin has been called into the pharmacy, and he doesn't know why , because the doctor never discussed it with him, Please advise

## 2014-10-04 NOTE — Telephone Encounter (Signed)
Phone call to patient explaining to him Dr Linna Darner wants him to start on Metformin due to his a1c rising since 02/2013.  I advised patient I will mail a copy of his labs but reminded him it shows [place] that he read this message via my chart 09/29/14 about starting Metformin. Patient states he does not remember the mention of starting Metformin in the note and is used to getting a phone call about labs. I mentioned that is the reason for mychart to reduce phone calls. Patient did state he will pick up the Metformin to start.

## 2014-10-04 NOTE — Telephone Encounter (Signed)
He read my notes 09/29/14 @ 12:05 pm Hard copy with highlighted instructions will mailed

## 2014-10-11 ENCOUNTER — Encounter (HOSPITAL_BASED_OUTPATIENT_CLINIC_OR_DEPARTMENT_OTHER): Payer: Medicare Other | Admitting: Anesthesiology

## 2014-10-11 ENCOUNTER — Encounter (HOSPITAL_BASED_OUTPATIENT_CLINIC_OR_DEPARTMENT_OTHER)
Admission: RE | Disposition: A | Discharge status: Home / Self Care | Payer: Self-pay | Source: Ambulatory Visit | Attending: Orthopedic Surgery

## 2014-10-11 ENCOUNTER — Encounter (HOSPITAL_BASED_OUTPATIENT_CLINIC_OR_DEPARTMENT_OTHER): Payer: Self-pay | Admitting: Anesthesiology

## 2014-10-11 ENCOUNTER — Ambulatory Visit (HOSPITAL_BASED_OUTPATIENT_CLINIC_OR_DEPARTMENT_OTHER)
Admission: RE | Admit: 2014-10-11 | Discharge: 2014-10-11 | Disposition: A | Discharge status: Home / Self Care | Payer: Medicare Other | Source: Ambulatory Visit | Attending: Orthopedic Surgery | Admitting: Orthopedic Surgery

## 2014-10-11 ENCOUNTER — Ambulatory Visit (HOSPITAL_BASED_OUTPATIENT_CLINIC_OR_DEPARTMENT_OTHER): Payer: Medicare Other | Admitting: Anesthesiology

## 2014-10-11 DIAGNOSIS — I2510 Atherosclerotic heart disease of native coronary artery without angina pectoris: Secondary | ICD-10-CM | POA: Diagnosis not present

## 2014-10-11 DIAGNOSIS — J45909 Unspecified asthma, uncomplicated: Secondary | ICD-10-CM | POA: Insufficient documentation

## 2014-10-11 DIAGNOSIS — I10 Essential (primary) hypertension: Secondary | ICD-10-CM | POA: Diagnosis not present

## 2014-10-11 DIAGNOSIS — K219 Gastro-esophageal reflux disease without esophagitis: Secondary | ICD-10-CM | POA: Insufficient documentation

## 2014-10-11 DIAGNOSIS — Z91041 Radiographic dye allergy status: Secondary | ICD-10-CM | POA: Insufficient documentation

## 2014-10-11 DIAGNOSIS — Z79899 Other long term (current) drug therapy: Secondary | ICD-10-CM | POA: Diagnosis not present

## 2014-10-11 DIAGNOSIS — G5602 Carpal tunnel syndrome, left upper limb: Secondary | ICD-10-CM | POA: Diagnosis not present

## 2014-10-11 DIAGNOSIS — E785 Hyperlipidemia, unspecified: Secondary | ICD-10-CM | POA: Insufficient documentation

## 2014-10-11 DIAGNOSIS — I447 Left bundle-branch block, unspecified: Secondary | ICD-10-CM | POA: Diagnosis not present

## 2014-10-11 DIAGNOSIS — Z87891 Personal history of nicotine dependence: Secondary | ICD-10-CM | POA: Diagnosis not present

## 2014-10-11 DIAGNOSIS — Z7982 Long term (current) use of aspirin: Secondary | ICD-10-CM | POA: Diagnosis not present

## 2014-10-11 HISTORY — PX: CARPAL TUNNEL RELEASE: SHX101

## 2014-10-11 LAB — GLUCOSE, CAPILLARY
GLUCOSE-CAPILLARY: 122 mg/dL — AB (ref 70–99)
Glucose-Capillary: 116 mg/dL — ABNORMAL HIGH (ref 70–99)

## 2014-10-11 LAB — POCT HEMOGLOBIN-HEMACUE: Hemoglobin: 13.4 g/dL (ref 13.0–17.0)

## 2014-10-11 SURGERY — CARPAL TUNNEL RELEASE
Anesthesia: Monitor Anesthesia Care | Site: Hand | Laterality: Left

## 2014-10-11 MED ORDER — MIDAZOLAM HCL 2 MG/2ML IJ SOLN
INTRAMUSCULAR | Status: AC
  Filled 2014-10-11: qty 2

## 2014-10-11 MED ORDER — ONDANSETRON HCL 4 MG/2ML IJ SOLN
INTRAMUSCULAR | Status: DC | PRN
  Administered 2014-10-11: 4 mg via INTRAVENOUS

## 2014-10-11 MED ORDER — CEFAZOLIN (ANCEF) 1 G IV SOLR
2.0000 g | INTRAVENOUS | Status: AC
  Administered 2014-10-11: 2 g

## 2014-10-11 MED ORDER — MIDAZOLAM HCL 5 MG/5ML IJ SOLN
INTRAMUSCULAR | Status: DC | PRN
  Administered 2014-10-11 (×2): 1 mg via INTRAVENOUS

## 2014-10-11 MED ORDER — LIDOCAINE HCL (PF) 1 % IJ SOLN
INTRAMUSCULAR | Status: DC | PRN
  Administered 2014-10-11: 20 mL

## 2014-10-11 MED ORDER — BUPIVACAINE HCL (PF) 0.25 % IJ SOLN
INTRAMUSCULAR | Status: DC | PRN
  Administered 2014-10-11: 20 mL

## 2014-10-11 MED ORDER — CEFAZOLIN SODIUM-DEXTROSE 2-3 GM-% IV SOLR
INTRAVENOUS | Status: AC
  Filled 2014-10-11: qty 50

## 2014-10-11 MED ORDER — FENTANYL CITRATE 0.05 MG/ML IJ SOLN
INTRAMUSCULAR | Status: AC
  Filled 2014-10-11: qty 2

## 2014-10-11 MED ORDER — OXYCODONE-ACETAMINOPHEN 5-325 MG PO TABS: 2.0000 | ORAL_TABLET | ORAL | Status: DC | PRN

## 2014-10-11 MED ORDER — MIDAZOLAM HCL 2 MG/2ML IJ SOLN: 1.0000 mg | INTRAMUSCULAR | Status: DC | PRN

## 2014-10-11 MED ORDER — DEXAMETHASONE SODIUM PHOSPHATE 4 MG/ML IJ SOLN
INTRAMUSCULAR | Status: DC | PRN
  Administered 2014-10-11: 10 mg via INTRAVENOUS

## 2014-10-11 MED ORDER — FENTANYL CITRATE 0.05 MG/ML IJ SOLN: 25.0000 ug | INTRAMUSCULAR | Status: DC | PRN

## 2014-10-11 MED ORDER — LACTATED RINGERS IV SOLN
INTRAVENOUS | Status: DC
  Administered 2014-10-11: 09:00:00 via INTRAVENOUS

## 2014-10-11 MED ORDER — ONDANSETRON HCL 4 MG/2ML IJ SOLN: 4.0000 mg | Freq: Once | INTRAMUSCULAR | Status: DC | PRN

## 2014-10-11 MED ORDER — FENTANYL CITRATE 0.05 MG/ML IJ SOLN
INTRAMUSCULAR | Status: DC | PRN
  Administered 2014-10-11 (×2): 50 ug via INTRAVENOUS

## 2014-10-11 MED ORDER — FENTANYL CITRATE 0.05 MG/ML IJ SOLN: 50.0000 ug | INTRAMUSCULAR | Status: DC | PRN

## 2014-10-11 MED ORDER — SODIUM BICARBONATE 4 % IV SOLN
INTRAVENOUS | Status: DC | PRN
  Administered 2014-10-11: 2 mL via INTRAVENOUS

## 2014-10-11 SURGICAL SUPPLY — 49 items
BANDAGE ELASTIC 3 VELCRO ST LF (GAUZE/BANDAGES/DRESSINGS) ×2 IMPLANT
BANDAGE ELASTIC 4 VELCRO ST LF (GAUZE/BANDAGES/DRESSINGS) ×2 IMPLANT
BLADE CARPAL TUNNEL SNGL USE (BLADE) ×2 IMPLANT
BLADE SURG 15 STRL LF DISP TIS (BLADE) ×2 IMPLANT
BLADE SURG 15 STRL SS (BLADE) ×2
BNDG CONFORM 3 STRL LF (GAUZE/BANDAGES/DRESSINGS) ×2 IMPLANT
BRUSH SCRUB EZ PLAIN DRY (MISCELLANEOUS) ×2 IMPLANT
CORDS BIPOLAR (ELECTRODE) ×2 IMPLANT
COVER BACK TABLE 60X90IN (DRAPES) ×2 IMPLANT
COVER MAYO STAND STRL (DRAPES) ×2 IMPLANT
CUFF TOURNIQUET SINGLE 18IN (TOURNIQUET CUFF) ×2 IMPLANT
DRAIN PENROSE 1/4X12 LTX STRL (WOUND CARE) IMPLANT
DRAPE EXTREMITY T 121X128X90 (DRAPE) ×2 IMPLANT
DRAPE SURG 17X23 STRL (DRAPES) ×2 IMPLANT
DRSG EMULSION OIL 3X3 NADH (GAUZE/BANDAGES/DRESSINGS) ×2 IMPLANT
GAUZE SPONGE 4X4 12PLY STRL (GAUZE/BANDAGES/DRESSINGS) IMPLANT
GAUZE SPONGE 4X4 16PLY XRAY LF (GAUZE/BANDAGES/DRESSINGS) IMPLANT
GAUZE XEROFORM 1X8 LF (GAUZE/BANDAGES/DRESSINGS) ×2 IMPLANT
GLOVE BIOGEL M STRL SZ7.5 (GLOVE) ×2 IMPLANT
GLOVE BIOGEL PI IND STRL 7.0 (GLOVE) ×1 IMPLANT
GLOVE BIOGEL PI INDICATOR 7.0 (GLOVE) ×1
GLOVE ECLIPSE 6.5 STRL STRAW (GLOVE) ×2 IMPLANT
GLOVE SS BIOGEL STRL SZ 8 (GLOVE) ×1 IMPLANT
GLOVE SUPERSENSE BIOGEL SZ 8 (GLOVE) ×1
GOWN STRL REUS W/ TWL LRG LVL3 (GOWN DISPOSABLE) ×1 IMPLANT
GOWN STRL REUS W/ TWL XL LVL3 (GOWN DISPOSABLE) ×1 IMPLANT
GOWN STRL REUS W/TWL LRG LVL3 (GOWN DISPOSABLE) ×1
GOWN STRL REUS W/TWL XL LVL3 (GOWN DISPOSABLE) ×1
LOOP VESSEL MAXI BLUE (MISCELLANEOUS) IMPLANT
NDL SAFETY ECLIPSE 18X1.5 (NEEDLE) ×1 IMPLANT
NEEDLE HYPO 18GX1.5 SHARP (NEEDLE) ×1
NEEDLE HYPO 22GX1.5 SAFETY (NEEDLE) IMPLANT
NEEDLE HYPO 25X1 1.5 SAFETY (NEEDLE) ×4 IMPLANT
NS IRRIG 1000ML POUR BTL (IV SOLUTION) ×2 IMPLANT
PACK BASIN DAY SURGERY FS (CUSTOM PROCEDURE TRAY) ×2 IMPLANT
PAD ALCOHOL SWAB (MISCELLANEOUS) ×16 IMPLANT
PAD CAST 3X4 CTTN HI CHSV (CAST SUPPLIES) ×2 IMPLANT
PADDING CAST ABS 4INX4YD NS (CAST SUPPLIES) ×1
PADDING CAST ABS COTTON 4X4 ST (CAST SUPPLIES) ×1 IMPLANT
PADDING CAST COTTON 3X4 STRL (CAST SUPPLIES) ×2
SPONGE GAUZE 4X4 12PLY STER LF (GAUZE/BANDAGES/DRESSINGS) ×2 IMPLANT
STOCKINETTE 4X48 STRL (DRAPES) ×2 IMPLANT
SUT PROLENE 4 0 PS 2 18 (SUTURE) ×2 IMPLANT
SYR BULB 3OZ (MISCELLANEOUS) ×2 IMPLANT
SYR CONTROL 10ML LL (SYRINGE) ×4 IMPLANT
TOWEL OR 17X24 6PK STRL BLUE (TOWEL DISPOSABLE) ×2 IMPLANT
TOWEL OR NON WOVEN STRL DISP B (DISPOSABLE) ×2 IMPLANT
TRAY DSU PREP LF (CUSTOM PROCEDURE TRAY) ×2 IMPLANT
UNDERPAD 30X30 INCONTINENT (UNDERPADS AND DIAPERS) ×2 IMPLANT

## 2014-10-11 NOTE — Op Note (Signed)
See dictation #[id number] Amedeo Plenty MD

## 2014-10-11 NOTE — H&P (Signed)
Jeffery [name] is an 73 y.o. male.   Chief Complaint: plan L CTR HPI: Patient presents for evaluation and treatment of the of their upper extremity predicament. The patient denies neck back chest or of abdominal pain. The patient notes that they have no lower extremity problems. The patient from primarily complains of the upper extremity pain noted. Past Medical History  Diagnosis Date  . Asthma   . CAD (coronary artery disease)   . LBBB (left bundle branch block) 1999  . GERD (gastroesophageal reflux disease)   . Seasonal allergies   . HTN (hypertension)   . Hyperlipidemia     Past Surgical History  Procedure Laterality Date  . Cardiac catheterization  2003    Dr Pernell Dupre; 85 % R circumflex obstruction  . Inguinal hernia repair      right, age 68  . Inguinal hernia repair      left age 22  . Lumbar laminectomy      age 17  . Tonsillectomy      age 18    Family History  Problem Relation Age of Onset  . Heart attack Father     15s  . Heart failure Mother     90s  . Subarachnoid hemorrhage Mother 75  . Hypertension Mother   . Subarachnoid hemorrhage Paternal Grandfather 38  . Diabetes Maternal Aunt    Social History:  reports that he quit smoking about 50 years ago. He does not have any smokeless tobacco history on file. He reports that he drinks about 1.2 ounces of alcohol per week. He reports that he does not use illicit drugs.  Allergies:  Allergies  Allergen Reactions  . Contrast Media [Iodinated Diagnostic Agents]     Redness and warm sensation     Medications Prior to Admission  Medication Sig Dispense Refill  . aspirin 81 MG tablet Take 81 mg by mouth at bedtime.        . Coenzyme Q10 (COQ10) 100 MG CAPS Take 200 mg by mouth daily.        . Colesevelam HCl 3.75 G PACK Take 3.75 g by mouth daily. Mix 1 packet with 4-8 ounces of water or juice and drink daily      . DULERA 200-5 MCG/ACT AERO INHALE TWO PUFFS BY MOUTH TWICE DAILY   13 g  5  . esomeprazole  (NEXIUM) 40 MG capsule Take 1 capsule (40 mg total) by mouth daily.  90 capsule  1  . fexofenadine (ALLEGRA) 180 MG tablet Take 180 mg by mouth daily as needed.        [name] lisinopril-hydrochlorothiazide (PRINZIDE,ZESTORETIC) 20-12.5 MG per tablet Take one tablet by mouth one time daily  90 tablet  3  . LORazepam (ATIVAN) 1 MG tablet TAKE 1/2 TO 1 TABLET BY MOUTH AT BEDTIME AS NEEDED  30 tablet  0  . metFORMIN (GLUCOPHAGE) 500 MG tablet 1 after largest meal daily  90 tablet  1  . montelukast (SINGULAIR) 10 MG tablet Take 1 tablet (10 mg total) by mouth daily.  90 tablet  3  . Multiple Vitamin (MULTIVITAMIN) tablet Take 1 tablet by mouth daily.       . nebivolol (BYSTOLIC) 5 MG tablet Take one tablet by mouth one time daily  90 tablet  3  . rosuvastatin (CRESTOR) 10 MG tablet Take 10 mg by mouth. Take 1 tablet every Monday, Wednesday, and Friday      . albuterol (PROAIR HFA) 108 (90 BASE) MCG/ACT inhaler Inhale 2  puffs into the lungs as needed.        [name] HYDROcodone-acetaminophen (NORCO) 7.5-325 MG per tablet 1 q 6 hrs prn only  30 tablet  0  . levalbuterol (XOPENEX) 1.25 MG/3ML nebulizer solution Take 1 ampule by nebulization as needed. Use in nebulizer prn       . LEVITRA 20 MG tablet TAKE 1 TABLET (20 MG TOTAL) BY MOUTH AS DIRECTED. CAN NOT BE TAKENWITH NITROGLYCERIN  6 tablet  5  . ONE TOUCH LANCETS MISC by Does not apply route as directed. Check blood sugar 6 time per day       . predniSONE (DELTASONE) 20 MG tablet Take 2 tablets (40 mg total) by mouth daily with breakfast.  10 tablet  0    Results for orders placed during the hospital encounter of 10/11/14 (from the past 48 hour(s))  POCT HEMOGLOBIN-HEMACUE     Status: None   Collection Time    10/11/14  8:36 AM      Result Value Ref Range   Hemoglobin 13.4  13.0 - 17.0 g/dL   No results found.  ROS  Blood pressure 126/72, pulse 60, temperature 97.7 F (36.5 C), temperature source Oral, resp. rate 20, height 5\' 9"  (1.753 [name]), weight  91.627 kg (202 lb), SpO2 100.00%. Physical Exam  L CTS The patient is alert and oriented in no acute distress the patient complains of pain in the affected upper extremity.  The patient is noted to have a normal HEENT exam.  Lung fields show equal chest expansion and no shortness of breath  abdomen exam is nontender without distention.  Lower extremity examination does not show any fracture dislocation or blood clot symptoms.  Pelvis is stable neck and back are stable and nontender Assessment/Plan Plan L CTR  Jeffery [name],Jeffery [name] 10/11/2014, 8:39 AM

## 2014-10-11 NOTE — Discharge Instructions (Signed)
He did great  Our office will call for your follow-up.  Please keep your dressing clean and dry and to see me in the office in 7 days  Keep bandage clean and dry.  Call for any problems.  No smoking.  Criteria for driving a car: you should be off your pain medicine for 7-8 hours, able to drive one handed(confident), thinking clearly and feeling able in your judgement to drive. Continue elevation as it will decrease swelling.  If instructed by MD move your fingers within the confines of the bandage/splint.  Use ice if instructed by your MD. Call immediately for any sudden loss of feeling in your hand/arm or change in functional abilities of the extremity.   We recommend that you to take vitamin C 1000 mg a day to promote healing we also recommend that if you require her pain medicine that he take a stool softener to prevent constipation as most pain medicines will have constipation side effects. We recommend either Peri-Colace or Senokot and recommend that you also consider adding MiraLAX to prevent the constipation affects from pain medicine if you are required to use them. These medicines are over the counter and maybe purchased at a local pharmacy.   Post Anesthesia Home Care Instructions  Activity: Get plenty of rest for the remainder of the day. A responsible adult should stay with you for 24 hours following the procedure.  For the next 24 hours, DO NOT: -Drive a car -Paediatric nurse -Drink alcoholic beverages -Take any medication unless instructed by your physician -Make any legal decisions or sign important papers.  Meals: Start with liquid foods such as gelatin or soup. Progress to regular foods as tolerated. Avoid greasy, spicy, heavy foods. If nausea and/or vomiting occur, drink only clear liquids until the nausea and/or vomiting subsides. Call your physician if vomiting continues.  Special Instructions/Symptoms: Your throat may feel dry or sore from the anesthesia or the  breathing tube placed in your throat during surgery. If this causes discomfort, gargle with warm salt water. The discomfort should disappear within 24 hours.

## 2014-10-11 NOTE — Anesthesia Procedure Notes (Signed)
Procedure Name: MAC Date/Time: 10/11/2014 8:53 AM Performed by: Lieutenant Diego Pre-anesthesia Checklist: Patient identified, Timeout performed, Emergency Drugs available, Suction available and Patient being monitored Patient Re-evaluated:Patient Re-evaluated prior to inductionOxygen Delivery Method: Simple face mask Preoxygenation: Pre-oxygenation with 100% oxygen

## 2014-10-11 NOTE — Anesthesia Preprocedure Evaluation (Signed)
Anesthesia Evaluation  Patient identified by MRN, date of birth, ID band Patient awake    Reviewed: Allergy & Precautions, H&P , NPO status , Patient's Chart, lab work & pertinent test results, reviewed documented beta blocker date and time   Airway Mallampati: I  TM Distance: >3 FB Neck ROM: Full    Dental  (+) Teeth Intact, Dental Advisory Given   Pulmonary asthma , neg sleep apnea, former smoker,  breath sounds clear to auscultation        Cardiovascular hypertension, Pt. on home beta blockers and Pt. on medications + CAD + dysrhythmias Rhythm:Regular Rate:Normal     Neuro/Psych    GI/Hepatic GERD-  Medicated and Controlled,  Endo/Other    Renal/GU      Musculoskeletal   Abdominal   Peds  Hematology   Anesthesia Other Findings   Reproductive/Obstetrics                             Anesthesia Physical Anesthesia Plan  ASA: III  Anesthesia Plan: MAC   Post-op Pain Management:    Induction: Intravenous  Airway Management Planned: Simple Face Mask  Additional Equipment:   Intra-op Plan:   Post-operative Plan:   Informed Consent: I have reviewed the patients History and Physical, chart, labs and discussed the procedure including the risks, benefits and alternatives for the proposed anesthesia with the patient or authorized representative who has indicated his/her understanding and acceptance.   Dental advisory given  Plan Discussed with: CRNA, Anesthesiologist and Surgeon  Anesthesia Plan Comments:         Anesthesia Quick Evaluation

## 2014-10-11 NOTE — Anesthesia Postprocedure Evaluation (Signed)
  Anesthesia Post-op Note  Patient: Jeffery [name]  Procedure(s) Performed: Procedure(s): LEFT CARPAL TUNNEL RELEASE (Left)  Patient Location: PACU  Anesthesia Type: MAC   Level of Consciousness: awake, alert  and oriented  Airway and Oxygen Therapy: Patient Spontanous Breathing  Post-op Pain: mild  Post-op Assessment: Post-op Vital signs reviewed  Post-op Vital Signs: Reviewed  Last Vitals:  Filed Vitals:   10/11/14 0945  BP: 110/60  Pulse: 72  Temp:   Resp: 16    Complications: No apparent anesthesia complications

## 2014-10-11 NOTE — Transfer of Care (Signed)
Immediate Anesthesia Transfer of Care Note  Patient: Jeffery [name]  Procedure(s) Performed: Procedure(s): LEFT CARPAL TUNNEL RELEASE (Left)  Patient Location: PACU  Anesthesia Type:MAC  Level of Consciousness: awake, alert  and oriented  Airway & Oxygen Therapy: Patient Spontanous Breathing and Patient connected to face mask oxygen  Post-op Assessment: Report given to PACU RN and Post -op Vital signs reviewed and stable  Post vital signs: Reviewed and stable  Complications: No apparent anesthesia complications

## 2014-10-15 ENCOUNTER — Encounter: Payer: Self-pay | Admitting: Internal Medicine

## 2014-10-16 NOTE — Op Note (Signed)
NAMEKatherene Ponto [name]           ACCOUNT NO.:  [id number]  MEDICAL RECORD NO.:  [id number]  LOCATION:                               FACILITY:  [facility]  PHYSICIAN:  Satira Anis. Tyleek Smick, M.D.DATE OF BIRTH:  Feb 02, 1941  DATE OF PROCEDURE:  10/11/2014 DATE OF DISCHARGE:  10/11/2014                              OPERATIVE REPORT   PREOPERATIVE DIAGNOSIS:  Carpal tunnel syndrome, left upper extremity.  POSTOPERATIVE DIAGNOSIS:  Carpal tunnel syndrome, left upper extremity.  PROCEDURE: 1. Left median nerve/peripheral nerve block, wrist forearm block at     the wrist forearm level for anesthetic purposes for carpal tunnel     release. 2. Left limited open carpal tunnel release.  SURGEON:  Satira Anis. Amedeo Plenty, M.D.  ASSISTANT:  None.  COMPLICATIONS:  None.  ANESTHESIA:  Peripheral nerve block with IV sedation keeping the patient awake, alert, and oriented the entire case.  TOURNIQUET TIME:  Less than 10 minutes.  INDICATIONS:  Pleasant male with carpal tunnel syndrome bilaterally, who presents for release.  He has failed conservative management.  Has objective and electrodiagnostic findings.  OPERATION IN DETAIL:  The patient was seen by myself and Anesthesia, taken to the operative theater, underwent smooth induction of peripheral nerve/median nerve block with light IV sedation on board.  He was awake, alert, and oriented the entire case.  Arm was prepped and draped. Tourniquet was applied and inflated to 250 mmHg.  Time-out was called. Incision was made, 1-1.5 cm at the distal edge of the transverse carpal ligament coursing proximally.  Following this, the patient then underwent careful dissection.  Retractor was deployed and distal edge of the transverse carpal ligament was released under 4.0 loupe magnification.  Fat pad egressed nicely.  Distal and proximal dissection was carried out __________.  Room was available for __________ device 1, 2, and 3, which were placed just under  the proximal leading leaflet of the transverse carpal ligament.  The patient tolerated this well.  There were no complicating features.  Once this was complete, the patient then underwent placement of the security clip, obturator disengaged, security knife was placed in security clip effectively releasing the proximal leaflet of the transcarpal ligament.  The patient tolerated this well. The patient had the canal inspected after release of the proximal leaflet and all looked well.  The median nerve was hyperemic, intact, and fully decompressed.  There were no complicating issues or features. I deflated the tourniquet, secured hemostasis with bipolar electrocautery.  Irrigated copiously and closed the wound with Prolene. The patient tolerated this well.  There were no complicating issues or features.  All sponge, needle, and instrument counts were reported as correct.  The patient be monitored in the recovery room.  Discharged home, Percocet p.r.n. pain.  See [place] in 7 days, therapy in 12 days, and our standard postop algorithm will be adhered to.     Satira Anis. Amedeo Plenty, M.D.     Sampson Si  D:  10/11/2014  T:  10/11/2014  Job:  [id number]

## 2014-11-04 ENCOUNTER — Other Ambulatory Visit: Payer: Self-pay | Admitting: Internal Medicine

## 2014-11-08 ENCOUNTER — Other Ambulatory Visit: Payer: Self-pay | Admitting: Internal Medicine

## 2014-11-10 ENCOUNTER — Other Ambulatory Visit: Payer: Self-pay | Admitting: Internal Medicine

## 2014-11-11 ENCOUNTER — Other Ambulatory Visit: Payer: Self-pay

## 2014-11-11 MED ORDER — LORAZEPAM 1 MG PO TABS: ORAL_TABLET | ORAL | Status: DC

## 2014-11-11 NOTE — Telephone Encounter (Signed)
Lorazepam has been called to [facility] on [address] 

## 2014-11-19 ENCOUNTER — Ambulatory Visit: Payer: Medicare Other

## 2014-11-20 ENCOUNTER — Encounter (HOSPITAL_BASED_OUTPATIENT_CLINIC_OR_DEPARTMENT_OTHER): Payer: Self-pay | Admitting: *Deleted

## 2014-11-20 NOTE — Pre-Procedure Instructions (Signed)
Pt does not need pre-operative labs done per Dr. Al Corpus. Pt here for surgery 09/2014.

## 2014-11-22 ENCOUNTER — Ambulatory Visit (HOSPITAL_BASED_OUTPATIENT_CLINIC_OR_DEPARTMENT_OTHER): Payer: Medicare Other | Admitting: Anesthesiology

## 2014-11-22 ENCOUNTER — Encounter (HOSPITAL_BASED_OUTPATIENT_CLINIC_OR_DEPARTMENT_OTHER)
Admission: RE | Disposition: A | Discharge status: Home / Self Care | Payer: Self-pay | Source: Ambulatory Visit | Attending: Orthopedic Surgery

## 2014-11-22 ENCOUNTER — Encounter (HOSPITAL_BASED_OUTPATIENT_CLINIC_OR_DEPARTMENT_OTHER): Payer: Self-pay

## 2014-11-22 ENCOUNTER — Ambulatory Visit (HOSPITAL_BASED_OUTPATIENT_CLINIC_OR_DEPARTMENT_OTHER)
Admission: RE | Admit: 2014-11-22 | Discharge: 2014-11-22 | Disposition: A | Discharge status: Home / Self Care | Payer: Medicare Other | Source: Ambulatory Visit | Attending: Orthopedic Surgery | Admitting: Orthopedic Surgery

## 2014-11-22 DIAGNOSIS — Z8249 Family history of ischemic heart disease and other diseases of the circulatory system: Secondary | ICD-10-CM | POA: Diagnosis not present

## 2014-11-22 DIAGNOSIS — Z833 Family history of diabetes mellitus: Secondary | ICD-10-CM | POA: Diagnosis not present

## 2014-11-22 DIAGNOSIS — E119 Type 2 diabetes mellitus without complications: Secondary | ICD-10-CM | POA: Insufficient documentation

## 2014-11-22 DIAGNOSIS — K219 Gastro-esophageal reflux disease without esophagitis: Secondary | ICD-10-CM | POA: Insufficient documentation

## 2014-11-22 DIAGNOSIS — M1990 Unspecified osteoarthritis, unspecified site: Secondary | ICD-10-CM | POA: Diagnosis not present

## 2014-11-22 DIAGNOSIS — E785 Hyperlipidemia, unspecified: Secondary | ICD-10-CM | POA: Diagnosis not present

## 2014-11-22 DIAGNOSIS — Z91041 Radiographic dye allergy status: Secondary | ICD-10-CM | POA: Insufficient documentation

## 2014-11-22 DIAGNOSIS — F1099 Alcohol use, unspecified with unspecified alcohol-induced disorder: Secondary | ICD-10-CM | POA: Diagnosis not present

## 2014-11-22 DIAGNOSIS — Z8489 Family history of other specified conditions: Secondary | ICD-10-CM | POA: Diagnosis not present

## 2014-11-22 DIAGNOSIS — I10 Essential (primary) hypertension: Secondary | ICD-10-CM | POA: Diagnosis not present

## 2014-11-22 DIAGNOSIS — G5601 Carpal tunnel syndrome, right upper limb: Secondary | ICD-10-CM | POA: Insufficient documentation

## 2014-11-22 DIAGNOSIS — G5602 Carpal tunnel syndrome, left upper limb: Secondary | ICD-10-CM | POA: Diagnosis not present

## 2014-11-22 DIAGNOSIS — I2510 Atherosclerotic heart disease of native coronary artery without angina pectoris: Secondary | ICD-10-CM | POA: Diagnosis not present

## 2014-11-22 DIAGNOSIS — I447 Left bundle-branch block, unspecified: Secondary | ICD-10-CM | POA: Diagnosis not present

## 2014-11-22 DIAGNOSIS — Z87891 Personal history of nicotine dependence: Secondary | ICD-10-CM | POA: Insufficient documentation

## 2014-11-22 DIAGNOSIS — J45909 Unspecified asthma, uncomplicated: Secondary | ICD-10-CM | POA: Insufficient documentation

## 2014-11-22 HISTORY — DX: Unspecified osteoarthritis, unspecified site: M19.90

## 2014-11-22 HISTORY — PX: MINOR CARPAL TUNNEL: SHX6472

## 2014-11-22 LAB — GLUCOSE, CAPILLARY: GLUCOSE-CAPILLARY: 124 mg/dL — AB (ref 70–99)

## 2014-11-22 SURGERY — MINOR CARPAL TUNNEL
Anesthesia: Monitor Anesthesia Care | Site: Wrist | Laterality: Right

## 2014-11-22 MED ORDER — FENTANYL CITRATE 0.05 MG/ML IJ SOLN
INTRAMUSCULAR | Status: AC
  Filled 2014-11-22: qty 2

## 2014-11-22 MED ORDER — SODIUM BICARBONATE 4 % IV SOLN
INTRAVENOUS | Status: AC
  Filled 2014-11-22: qty 5

## 2014-11-22 MED ORDER — LIDOCAINE HCL (PF) 1 % IJ SOLN
INTRAMUSCULAR | Status: DC | PRN
  Administered 2014-11-22: 10 mL

## 2014-11-22 MED ORDER — OXYCODONE HCL 5 MG PO TABS: 10.0000 mg | ORAL_TABLET | ORAL | Status: DC | PRN

## 2014-11-22 MED ORDER — LIDOCAINE HCL (PF) 1 % IJ SOLN
INTRAMUSCULAR | Status: AC
  Filled 2014-11-22: qty 30

## 2014-11-22 MED ORDER — CEFAZOLIN (ANCEF) 1 G IV SOLR
2.0000 g | INTRAVENOUS | Status: AC
  Administered 2014-11-22: 2 g

## 2014-11-22 MED ORDER — LACTATED RINGERS IV SOLN
INTRAVENOUS | Status: DC
  Administered 2014-11-22: 07:00:00 via INTRAVENOUS

## 2014-11-22 MED ORDER — CEFAZOLIN SODIUM-DEXTROSE 2-3 GM-% IV SOLR
INTRAVENOUS | Status: AC
  Filled 2014-11-22: qty 50

## 2014-11-22 MED ORDER — SODIUM BICARBONATE 4 % IV SOLN
INTRAVENOUS | Status: DC | PRN
  Administered 2014-11-22: 2 mL via INTRAVENOUS

## 2014-11-22 MED ORDER — BUPIVACAINE HCL (PF) 0.25 % IJ SOLN
INTRAMUSCULAR | Status: DC | PRN
  Administered 2014-11-22: 10 mL

## 2014-11-22 MED ORDER — MIDAZOLAM HCL 2 MG/2ML IJ SOLN
INTRAMUSCULAR | Status: AC
Start: 2014-11-22 — End: 2014-11-22
  Filled 2014-11-22: qty 2

## 2014-11-22 MED ORDER — FENTANYL CITRATE 0.05 MG/ML IJ SOLN
50.0000 ug | INTRAMUSCULAR | Status: DC | PRN
  Administered 2014-11-22: 50 ug via INTRAVENOUS

## 2014-11-22 MED ORDER — MIDAZOLAM HCL 2 MG/2ML IJ SOLN
1.0000 mg | INTRAMUSCULAR | Status: DC | PRN
  Administered 2014-11-22: 1 mg via INTRAVENOUS

## 2014-11-22 SURGICAL SUPPLY — 45 items
BANDAGE ELASTIC 3 VELCRO ST LF (GAUZE/BANDAGES/DRESSINGS) ×2 IMPLANT
BLADE CARPAL TUNNEL SNGL USE (BLADE) ×2 IMPLANT
BLADE MINI RND TIP GREEN BEAV (BLADE) IMPLANT
BLADE SURG 15 STRL LF DISP TIS (BLADE) ×2 IMPLANT
BLADE SURG 15 STRL SS (BLADE) ×2
BNDG CONFORM 3 STRL LF (GAUZE/BANDAGES/DRESSINGS) ×2 IMPLANT
BRUSH SCRUB EZ PLAIN DRY (MISCELLANEOUS) ×2 IMPLANT
CORDS BIPOLAR (ELECTRODE) IMPLANT
COVER BACK TABLE 60X90IN (DRAPES) ×2 IMPLANT
COVER MAYO STAND STRL (DRAPES) ×2 IMPLANT
CUFF TOURNIQUET SINGLE 18IN (TOURNIQUET CUFF) ×2 IMPLANT
DECANTER SPIKE VIAL GLASS SM (MISCELLANEOUS) IMPLANT
DRAPE EXTREMITY T 121X128X90 (DRAPE) ×2 IMPLANT
DRAPE SURG 17X23 STRL (DRAPES) ×2 IMPLANT
DRSG EMULSION OIL 3X3 NADH (GAUZE/BANDAGES/DRESSINGS) ×2 IMPLANT
GAUZE SPONGE 4X4 16PLY XRAY LF (GAUZE/BANDAGES/DRESSINGS) IMPLANT
GAUZE XEROFORM 1X8 LF (GAUZE/BANDAGES/DRESSINGS) ×2 IMPLANT
GLOVE BIO SURGEON STRL SZ8 (GLOVE) IMPLANT
GLOVE BIOGEL PI IND STRL 7.0 (GLOVE) ×2 IMPLANT
GLOVE BIOGEL PI INDICATOR 7.0 (GLOVE) ×2
GLOVE ECLIPSE 6.5 STRL STRAW (GLOVE) ×2 IMPLANT
GLOVE SS BIOGEL STRL SZ 8 (GLOVE) ×1 IMPLANT
GLOVE SUPERSENSE BIOGEL SZ 8 (GLOVE) ×1
GOWN STRL REUS W/ TWL LRG LVL3 (GOWN DISPOSABLE) ×1 IMPLANT
GOWN STRL REUS W/ TWL XL LVL3 (GOWN DISPOSABLE) ×1 IMPLANT
GOWN STRL REUS W/TWL LRG LVL3 (GOWN DISPOSABLE) ×1
GOWN STRL REUS W/TWL XL LVL3 (GOWN DISPOSABLE) ×1
NEEDLE HYPO 22GX1.5 SAFETY (NEEDLE) IMPLANT
NEEDLE HYPO 25X1 1.5 SAFETY (NEEDLE) ×4 IMPLANT
NS IRRIG 1000ML POUR BTL (IV SOLUTION) ×2 IMPLANT
PACK BASIN DAY SURGERY FS (CUSTOM PROCEDURE TRAY) ×2 IMPLANT
PAD ALCOHOL SWAB (MISCELLANEOUS) ×16 IMPLANT
PAD CAST 3X4 CTTN HI CHSV (CAST SUPPLIES) ×1 IMPLANT
PADDING CAST ABS 3INX4YD NS (CAST SUPPLIES)
PADDING CAST ABS 4INX4YD NS (CAST SUPPLIES)
PADDING CAST ABS COTTON 3X4 (CAST SUPPLIES) IMPLANT
PADDING CAST ABS COTTON 4X4 ST (CAST SUPPLIES) IMPLANT
PADDING CAST COTTON 3X4 STRL (CAST SUPPLIES) ×1
STOCKINETTE 4X48 STRL (DRAPES) ×2 IMPLANT
SUT PROLENE 4 0 PS 2 18 (SUTURE) ×2 IMPLANT
SYR BULB 3OZ (MISCELLANEOUS) ×2 IMPLANT
SYR CONTROL 10ML LL (SYRINGE) ×4 IMPLANT
TOWEL OR 17X24 6PK STRL BLUE (TOWEL DISPOSABLE) ×4 IMPLANT
TOWEL OR NON WOVEN STRL DISP B (DISPOSABLE) ×2 IMPLANT
UNDERPAD 30X30 INCONTINENT (UNDERPADS AND DIAPERS) ×2 IMPLANT

## 2014-11-22 NOTE — Anesthesia Postprocedure Evaluation (Signed)
Anesthesia Post Note  Patient: Jeffery [name]  Procedure(s) Performed: Procedure(s) (LRB): RIGHT LIMITED OPEN CARPAL TUNNEL RELEASE (Right)  Anesthesia type: MAC  Patient location: PACU  Post pain: Pain level controlled  Post assessment: Patient's Cardiovascular Status Stable  Last Vitals:  Filed Vitals:   11/22/14 0848  BP: 114/61  Pulse: 56  Temp: 36.4 C  Resp: 16    Post vital signs: Reviewed and stable  Level of consciousness: sedated  Complications: No apparent anesthesia complications

## 2014-11-22 NOTE — Anesthesia Preprocedure Evaluation (Signed)
Anesthesia Evaluation  Patient identified by MRN, date of birth, ID band Patient awake    Reviewed: Allergy & Precautions, H&P , NPO status , Patient's Chart, lab work & pertinent test results, reviewed documented beta blocker date and time   Airway Mallampati: I  TM Distance: >3 FB Neck ROM: Full    Dental  (+) Teeth Intact, Dental Advisory Given   Pulmonary asthma , neg sleep apnea, former smoker,  breath sounds clear to auscultation        Cardiovascular hypertension, Pt. on home beta blockers and Pt. on medications + CAD + dysrhythmias Rhythm:Regular Rate:Normal     Neuro/Psych    GI/Hepatic GERD-  Medicated and Controlled,  Endo/Other  diabetes  Renal/GU      Musculoskeletal   Abdominal   Peds  Hematology   Anesthesia Other Findings   Reproductive/Obstetrics                             Anesthesia Physical  Anesthesia Plan  ASA: III  Anesthesia Plan: MAC   Post-op Pain Management:    Induction: Intravenous  Airway Management Planned: Simple Face Mask  Additional Equipment:   Intra-op Plan:   Post-operative Plan:   Informed Consent: I have reviewed the patients History and Physical, chart, labs and discussed the procedure including the risks, benefits and alternatives for the proposed anesthesia with the patient or authorized representative who has indicated his/her understanding and acceptance.   Dental advisory given  Plan Discussed with: CRNA, Anesthesiologist and Surgeon  Anesthesia Plan Comments:         Anesthesia Quick Evaluation

## 2014-11-22 NOTE — Transfer of Care (Signed)
Immediate Anesthesia Transfer of Care Note  Patient: Jeffery [name]  Procedure(s) Performed: Procedure(s): RIGHT LIMITED OPEN CARPAL TUNNEL RELEASE (Right)  Patient Location: PACU  Anesthesia Type:MAC  Level of Consciousness: awake, alert  and oriented  Airway & Oxygen Therapy: Patient Spontanous Breathing  Post-op Assessment: Report given to PACU RN and Post -op Vital signs reviewed and stable  Post vital signs: Reviewed and stable  Complications: No apparent anesthesia complications

## 2014-11-22 NOTE — H&P (Signed)
Jeffery [name] is an 73 y.o. male.   Chief Complaint: right carpal tunnel syndrome HPI: patient presents for right carpal tunnel release.  Patient understands risk and if it's.  Patient has had a successful left carpal tunnel release without sequelae.  We will plan for right limited open carpal tunnel release today under local with IV sedation  Past Medical History  Diagnosis Date  . Asthma   . CAD (coronary artery disease)   . LBBB (left bundle branch block) 1999  . GERD (gastroesophageal reflux disease)   . Seasonal allergies   . HTN (hypertension)   . Hyperlipidemia   . Diabetes mellitus without complication   . Arthritis     Past Surgical History  Procedure Laterality Date  . Cardiac catheterization  2003    Dr Pernell Dupre; 85 % R circumflex obstruction  . Inguinal hernia repair      right, age 70  . Inguinal hernia repair      left age 53  . Lumbar laminectomy      age 30  . Tonsillectomy      age 41  . Carpal tunnel release Left 10/11/2014    Procedure: LEFT CARPAL TUNNEL RELEASE;  Surgeon: Roseanne Kaufman, MD;  Location: [facility];  Service: Orthopedics;  Laterality: Left;    Family History  Problem Relation Age of Onset  . Heart attack Father     27s  . Heart failure Mother     90s  . Subarachnoid hemorrhage Mother 49  . Hypertension Mother   . Subarachnoid hemorrhage Paternal Grandfather 75  . Diabetes Maternal Aunt    Social History:  reports that he quit smoking about 50 years ago. He does not have any smokeless tobacco history on file. He reports that he drinks about 1.2 oz of alcohol per week. He reports that he does not use illicit drugs.  Allergies:  Allergies  Allergen Reactions  . Contrast Media [Iodinated Diagnostic Agents]     Redness and warm sensation     Medications Prior to Admission  Medication Sig Dispense Refill  . aspirin 81 MG tablet Take 81 mg by mouth at bedtime.      . Coenzyme Q10 (COQ10) 100 MG CAPS Take 200  mg by mouth daily.      . DULERA 200-5 MCG/ACT AERO INHALE TWO PUFFS BY MOUTH TWICE DAILY  13 g 5  . esomeprazole (NEXIUM) 40 MG capsule Take 1 capsule (40 mg total) by mouth daily. 90 capsule 1  . lisinopril-hydrochlorothiazide (PRINZIDE,ZESTORETIC) 20-12.5 MG per tablet Take one tablet by mouth one time daily 90 tablet 3  . LORazepam (ATIVAN) 1 MG tablet TAKE 1/2 TO 1 TABLET BY MOUTH AT BEDTIME AS NEEDED 30 tablet 0  . metFORMIN (GLUCOPHAGE) 500 MG tablet 1 after largest meal daily 90 tablet 1  . montelukast (SINGULAIR) 10 MG tablet TAKE ONE TABLET BY MOUTH ONE TIME DAILY  90 tablet 2  . Multiple Vitamin (MULTIVITAMIN) tablet Take 1 tablet by mouth daily.     . nebivolol (BYSTOLIC) 5 MG tablet Take one tablet by mouth one time daily 90 tablet 3  . ONE TOUCH LANCETS MISC by Does not apply route as directed. Check blood sugar 6 time per day     . rosuvastatin (CRESTOR) 10 MG tablet Take 10 mg by mouth. Take 1 tablet every Monday, Wednesday, and Friday    . albuterol (PROAIR HFA) 108 (90 BASE) MCG/ACT inhaler Inhale 2 puffs into the lungs as needed.      [name]  Colesevelam HCl 3.75 G PACK Take 3.75 g by mouth daily. Mix 1 packet with 4-8 ounces of water or juice and drink daily    . fexofenadine (ALLEGRA) 180 MG tablet Take 180 mg by mouth daily as needed.      [name] HYDROcodone-acetaminophen (NORCO) 7.5-325 MG per tablet 1 q 6 hrs prn only 30 tablet 0  . levalbuterol (XOPENEX) 1.25 MG/3ML nebulizer solution Take 1 ampule by nebulization as needed. Use in nebulizer prn     . LEVITRA 20 MG tablet TAKE 1 TABLET (20 MG TOTAL) BY MOUTH AS DIRECTED. CAN NOT BE TAKENWITH NITROGLYCERIN 6 tablet 5  . oxyCODONE-acetaminophen (ROXICET) 5-325 MG per tablet Take 2 tablets by mouth every 4 (four) hours as needed for severe pain. 45 tablet 0  . predniSONE (DELTASONE) 20 MG tablet Take 2 tablets (40 mg total) by mouth daily with breakfast. 10 tablet 0    No results found for this or any previous visit (from the past 48  hour(s)). No results found.  Review of Systems  Eyes: Negative.   Respiratory: Negative.   Cardiovascular: Negative.   Gastrointestinal: Negative.   Neurological: Negative.   Psychiatric/Behavioral: Negative.     Height 5' 9.5" (1.765 m), weight 90.266 kg (199 lb). Physical Exam right carpal tunnel syndrome with positive Tinel's median nerve compression test and Phalen's test  The patient is alert and oriented in no acute distress the patient complains of pain in the affected upper extremity.  The patient is noted to have a normal HEENT exam.  Lung fields show equal chest expansion and no shortness of breath  abdomen exam is nontender without distention.  Lower extremity examination does not show any fracture dislocation or blood clot symptoms.  Pelvis is stable neck and back are stable and nontender Assessment/Plan We will plan for right limited open carpal tunnel release.  Patient understands all issues risk and benefits and postop issues as they are germane to his extremity predicament We are planning surgery for your upper extremity. The risk and benefits of surgery include risk of bleeding infection anesthesia damage to normal structures and failure of the surgery to accomplish its intended goals of relieving symptoms and restoring function with this in mind we'll going to proceed. I have specifically discussed with the patient the pre-and postoperative regime and the does and don'ts and risk and benefits in great detail. Risk and benefits of surgery also include risk of dystrophy chronic nerve pain failure of the healing process to go onto completion and other inherent risks of surgery The relavent the pathophysiology of the disease/injury process, as well as the alternatives for treatment and postoperative course of action has been discussed in great detail with the patient who desires to proceed.  We will do everything in our power to help you (the patient) restore function to the  upper extremity. Is a pleasure to see this patient today.  Paulene Floor 11/22/2014, 7:26 AM

## 2014-11-22 NOTE — Discharge Instructions (Signed)
Keep bandage clean and dry.  Call for any problems.  No smoking.  Criteria for driving a car: you should be off your pain medicine for 7-8 hours, able to drive one handed(confident), thinking clearly and feeling able in your judgement to drive. °Continue elevation as it will decrease swelling.  If instructed by MD move your fingers within the confines of the bandage/splint.  Use ice if instructed by your MD. Call immediately for any sudden loss of feeling in your hand/arm or change in functional abilities of the extremity. ° ° °We recommend that you to take vitamin C 1000 mg a day to promote healing we also recommend that if you require her pain medicine that he take a stool softener to prevent constipation as most pain medicines will have constipation side effects. We recommend either Peri-Colace or Senokot and recommend that you also consider adding MiraLAX to prevent the constipation affects from pain medicine if you are required to use them. These medicines are over the counter and maybe purchased at a local pharmacy. ° ° °Post Anesthesia Home Care Instructions ° °Activity: °Get plenty of rest for the remainder of the day. A responsible adult should stay with you for 24 hours following the procedure.  °For the next 24 hours, DO NOT: °-Drive a car °-Operate machinery °-Drink alcoholic beverages °-Take any medication unless instructed by your physician °-Make any legal decisions or sign important papers. ° °Meals: °Start with liquid foods such as gelatin or soup. Progress to regular foods as tolerated. Avoid greasy, spicy, heavy foods. If nausea and/or vomiting occur, drink only clear liquids until the nausea and/or vomiting subsides. Call your physician if vomiting continues. ° °Special Instructions/Symptoms: °Your throat may feel dry or sore from the anesthesia or the breathing tube placed in your throat during surgery. If this causes discomfort, gargle with warm salt water. The discomfort should disappear  within 24 hours. ° °

## 2014-11-22 NOTE — Op Note (Signed)
See [id number] Amedeo Plenty MD

## 2014-11-25 ENCOUNTER — Encounter (HOSPITAL_BASED_OUTPATIENT_CLINIC_OR_DEPARTMENT_OTHER): Payer: Self-pay | Admitting: Orthopedic Surgery

## 2014-11-25 NOTE — Op Note (Signed)
NAMERobt, [name]            ACCOUNT NO.:  [id number]  MEDICAL RECORD NO.:  [id number]  LOCATION:                                 FACILITY:  PHYSICIAN:  Satira Anis. Cameren Odwyer, M.D.DATE OF BIRTH:  13-Feb-1941  DATE OF PROCEDURE:  11/22/2014 DATE OF DISCHARGE:  11/22/2014                              OPERATIVE REPORT   [name], Jeffery  PREOPERATIVE DIAGNOSIS:  Right carpal tunnel syndrome.  POSTOPERATIVE DIAGNOSIS:  Right carpal tunnel syndrome.  PROCEDURES: 1. Right median nerve/peripheral nerve block at the wrist performed     for anesthetic purposes for carpal tunnel release. 2. Right limited open carpal tunnel release.  SURGEON:  Satira Anis. Amedeo Plenty, M.D.  ASSISTANT:  None.  COMPLICATIONS:  None.  ANESTHESIA:  Peripheral nerve block with IV sedation keeping the patient awake, alert, and oriented during the entire case.  TOURNIQUET TIME:  Less than 10 minutes.  INDICATIONS:  A 73 year old male presents with the above-mentioned diagnosis.  I have counseled him regarding the risks and benefits of the surgery, and he desires to proceed with the above-mentioned operative intervention.  All questions have been encouraged and answered preoperatively.  OPERATIVE PROCEDURE:  The patient was seen by myself and Anesthesia, taken to operative suite, underwent smooth induction of peripheral nerve/median nerve block.  He was given fentanyl and Versed to sedate him slightly, but he was awake, alert, and oriented the entire case. Following this, he was prepped and draped in usual sterile fashion and time-out was called.  Once this was complete, tourniquet was inflated to 250 mmHg.  Dissection was then accomplished with a sharp knife blade about the distal edge of the transcarpal ligament.  Incision was made 1 cm in nature.  Dissection was carried down.  Retractor placed. Following this, we then identified the transverse carpal ligament.  The transverse carpal ligament was  released under 4.0 loupe magnification about the distal edge.  Fat pad egressed confirming complete distal release.  Additional palmar fascia bands were released as well; and following this, a distal to proximal dissection was carried out intact. Room was available for canal, prepared toward device 1, 2, and 3, which were placed just under the proximal leading leaflet of the transverse carpal ligament.  Following this, a security clip was placed.  Obturator disengaged.  Security knife was then placed and security clip effectively releasing the proximal leaflet of the transverse carpal ligament.  Once this was complete, the patient then underwent irrigation, followed by inspection of the canal, followed by wound closure.  He was awake, alert, and oriented, fully decompressed, looked excellent.  There were no complications.  Sterile bandage of Adaptic, Xeroform gauze, and sterile wrapping was accomplished.  To see [place] in a week, therapy in 12 days. Notify if same problems occur and proceed according to standard postop algorithm.     Satira Anis. Amedeo Plenty, M.D.     [facility]  D:  11/22/2014  T:  11/22/2014  Job:  [id number]

## 2014-11-27 ENCOUNTER — Other Ambulatory Visit: Payer: Self-pay

## 2014-11-27 ENCOUNTER — Ambulatory Visit: Payer: Medicare Other | Admitting: Interventional Cardiology

## 2014-11-27 MED ORDER — NEBIVOLOL HCL 5 MG PO TABS: ORAL_TABLET | ORAL | Status: DC

## 2014-12-07 ENCOUNTER — Other Ambulatory Visit: Payer: Self-pay | Admitting: Internal Medicine

## 2014-12-09 ENCOUNTER — Other Ambulatory Visit: Payer: Self-pay

## 2014-12-09 MED ORDER — AUTOLET IMPRESSION MISC: Status: DC

## 2014-12-09 MED ORDER — ACCU-CHEK AVIVA VI SOLN: Status: DC

## 2014-12-09 MED ORDER — ACCU-CHEK AVIVA PLUS W/DEVICE KIT: PACK | Status: DC

## 2014-12-09 MED ORDER — GLUCOSE BLOOD VI STRP: ORAL_STRIP | Status: DC

## 2014-12-09 NOTE — Telephone Encounter (Signed)
Lorazepam has been called to [facility] on [address]

## 2014-12-09 NOTE — Telephone Encounter (Signed)
OK X1 

## 2014-12-11 ENCOUNTER — Other Ambulatory Visit: Payer: Self-pay | Admitting: Interventional Cardiology

## 2014-12-11 ENCOUNTER — Other Ambulatory Visit: Payer: Self-pay

## 2014-12-11 ENCOUNTER — Other Ambulatory Visit: Payer: Self-pay | Admitting: *Deleted

## 2014-12-11 MED ORDER — LISINOPRIL-HYDROCHLOROTHIAZIDE 20-12.5 MG PO TABS: ORAL_TABLET | ORAL | Status: DC

## 2014-12-11 MED ORDER — MONTELUKAST SODIUM 10 MG PO TABS: 10.0000 mg | ORAL_TABLET | Freq: Every day | ORAL | Status: DC

## 2014-12-11 MED ORDER — METFORMIN HCL 500 MG PO TABS: ORAL_TABLET | ORAL | Status: DC

## 2014-12-11 MED ORDER — MOMETASONE FURO-FORMOTEROL FUM 200-5 MCG/ACT IN AERO: 2.0000 | INHALATION_SPRAY | Freq: Two times a day (BID) | RESPIRATORY_TRACT | Status: DC

## 2014-12-19 ENCOUNTER — Other Ambulatory Visit: Payer: Self-pay

## 2014-12-19 MED ORDER — LISINOPRIL-HYDROCHLOROTHIAZIDE 20-12.5 MG PO TABS: ORAL_TABLET | ORAL | Status: DC

## 2014-12-19 MED ORDER — NEBIVOLOL HCL 5 MG PO TABS: ORAL_TABLET | ORAL | Status: DC

## 2014-12-20 ENCOUNTER — Other Ambulatory Visit: Payer: Self-pay | Admitting: *Deleted

## 2014-12-20 MED ORDER — NEBIVOLOL HCL 5 MG PO TABS: ORAL_TABLET | ORAL | Status: DC

## 2014-12-31 ENCOUNTER — Other Ambulatory Visit: Payer: Self-pay | Admitting: Internal Medicine

## 2014-12-31 MED ORDER — HYDROCODONE-ACETAMINOPHEN 7.5-325 MG PO TABS: ORAL_TABLET | ORAL | Status: DC

## 2015-01-01 ENCOUNTER — Other Ambulatory Visit: Payer: Self-pay

## 2015-01-01 MED ORDER — ACCU-CHEK SOFTCLIX LANCETS MISC: Status: DC

## 2015-01-24 ENCOUNTER — Encounter: Payer: Self-pay | Admitting: Interventional Cardiology

## 2015-01-24 ENCOUNTER — Ambulatory Visit (INDEPENDENT_AMBULATORY_CARE_PROVIDER_SITE_OTHER): Payer: Medicare Other | Admitting: Interventional Cardiology

## 2015-01-24 DIAGNOSIS — I10 Essential (primary) hypertension: Secondary | ICD-10-CM

## 2015-01-24 DIAGNOSIS — I5022 Chronic systolic (congestive) heart failure: Secondary | ICD-10-CM | POA: Insufficient documentation

## 2015-01-24 DIAGNOSIS — I5042 Chronic combined systolic (congestive) and diastolic (congestive) heart failure: Secondary | ICD-10-CM | POA: Diagnosis not present

## 2015-01-24 DIAGNOSIS — I2510 Atherosclerotic heart disease of native coronary artery without angina pectoris: Secondary | ICD-10-CM | POA: Diagnosis not present

## 2015-01-24 DIAGNOSIS — I447 Left bundle-branch block, unspecified: Secondary | ICD-10-CM | POA: Diagnosis not present

## 2015-01-24 NOTE — Progress Notes (Signed)
Patient ID: Jeffery [name], male   DOB: 20-Mar-1941, 74 y.o.   MRN: [id number]    Cardiology Office Note   Date:  01/24/2015   ID:  Jeffery [name], DOB 1941-10-19, MRN [id number]  PCP:  Unice Cobble, MD  Cardiologist:   Sinclair Grooms, MD   No chief complaint on file.     History of Present Illness: Jeffery [name] is a 74 y.o. male who presents for  Follow-up of idiopathic nonischemic left ventricular combined systolic and diastolic dysfunction. We have been following this since 2002. He has known chronic left bundle branch block. LVEF has been in the 40-45% range over that period of time except for neck: 2009 that suggested the EF was 30-35%. A cardiac MRI at that time demonstrated an EF of 45%.   [name] feels that he has been more fatigued and does not have as much strength as he is previously grown accustomed.  He denies orthopnea, PND, chest pain, and syncope. There is no peripheral edema. 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 He seems somewhat depressed by this.    Past Medical History  Diagnosis Date  . Asthma   . CAD (coronary artery disease)   . LBBB (left bundle branch block) 1999  . GERD (gastroesophageal reflux disease)   . Seasonal allergies   . HTN (hypertension)   . Hyperlipidemia   . Diabetes mellitus without complication   . Arthritis     Past Surgical History  Procedure Laterality Date  . Cardiac catheterization  2003    Dr Pernell Dupre; 85 % R circumflex obstruction  . Inguinal hernia repair      right, age 82  . Inguinal hernia repair      left age 89  . Lumbar laminectomy      age 18  . Tonsillectomy      age 10  . Carpal tunnel release Left 10/11/2014    Procedure: LEFT CARPAL TUNNEL RELEASE;  Surgeon: Roseanne Kaufman, MD;  Location: [facility];  Service: Orthopedics;  Laterality: Left;  [name] Minor carpal tunnel Right 11/22/2014    Procedure: RIGHT LIMITED OPEN CARPAL TUNNEL RELEASE;  Surgeon: Roseanne Kaufman, MD;   Location: [facility];  Service: Orthopedics;  Laterality: Right;     Current Outpatient Prescriptions  Medication Sig Dispense Refill  . ACCU-CHEK SOFTCLIX LANCETS lancets Use as instructed 100 each 3  . albuterol (PROAIR HFA) 108 (90 BASE) MCG/ACT inhaler Inhale 2 puffs into the lungs as needed.      [name] aspirin 81 MG tablet Take 81 mg by mouth at bedtime.      . Blood Glucose Calibration (ACCU-CHEK AVIVA) SOLN Use as instructed to test blood sugar ICD 10 E11 51 1 each 3  . Blood Glucose Monitoring Suppl (ACCU-CHEK AVIVA PLUS) W/DEVICE KIT Use as instructed to test blood sugar ICD 10 E11 59 1 kit 0  . Coenzyme Q10 (COQ10) 100 MG CAPS Take 200 mg by mouth daily.      . Colesevelam HCl 3.75 G PACK Take 3.75 g by mouth daily. Mix 1 packet with 4-8 ounces of water or juice and drink daily    . glucose blood (ACCU-CHEK AVIVA PLUS) test strip Use as instructed to test blood sugar ICD 10 E11 51 100 each 3  . HYDROcodone-acetaminophen (NORCO) 7.5-325 MG per tablet 1 q 6 hrs prn only 30 tablet 0  . Lancet Devices (AUTOLET IMPRESSION) MISC Use as instructed to test blood sugar ICD 10 E11  51 1 each 3  . levalbuterol (XOPENEX) 1.25 MG/3ML nebulizer solution Take 1 ampule by nebulization as needed. Use in nebulizer prn     . LEVITRA 20 MG tablet TAKE 1 TABLET (20 MG TOTAL) BY MOUTH AS DIRECTED. CAN NOT BE TAKENWITH NITROGLYCERIN 6 tablet 5  . lisinopril-hydrochlorothiazide (PRINZIDE,ZESTORETIC) 20-12.5 MG per tablet Take one tablet by mouth one time daily 90 tablet 2  . LORazepam (ATIVAN) 1 MG tablet Take 1/2-1 tablet by mouth nightly at bedtime as needed. 30 tablet 0  . metFORMIN (GLUCOPHAGE) 500 MG tablet 1 after largest meal daily 90 tablet 1  . mometasone-formoterol (DULERA) 200-5 MCG/ACT AERO Inhale 2 puffs into the lungs 2 (two) times daily. 39 g 3  . montelukast (SINGULAIR) 10 MG tablet Take 1 tablet (10 mg total) by mouth daily. 90 tablet 1  . Multiple Vitamin (MULTIVITAMIN) tablet  Take 1 tablet by mouth daily.     . nebivolol (BYSTOLIC) 5 MG tablet Take one tablet by mouth one time daily 90 tablet 1  . ONE TOUCH LANCETS MISC by Does not apply route as directed. Check blood sugar 6 time per day     . rosuvastatin (CRESTOR) 10 MG tablet Take 10 mg by mouth once a week. Patient takes one tab by mouth on Wednesdays     No current facility-administered medications for this visit.    Allergies:   Contrast media    Social History:  The patient  reports that he quit smoking about 51 years ago. He does not have any smokeless tobacco history on file. He reports that he drinks about 1.2 oz of alcohol per week. He reports that he does not use illicit drugs.   Family History:  The patient's family history includes Diabetes in his maternal aunt; Heart attack in his father; Heart failure in his mother; Hypertension in his mother; Subarachnoid hemorrhage (age of onset: 41) in his mother; Subarachnoid hemorrhage (age of onset: 68) in his paternal grandfather.    ROS:  Please see the history of present illness.   Otherwise, review of systems are positive for  Emotional stress and anger.   All other systems are reviewed and negative.    PHYSICAL EXAM: VS:  BP 128/78 mmHg  Pulse 71  Ht 5' 9.5" (1.765 m)  Wt 203 lb 6.4 oz (92.262 kg)  BMI 29.62 kg/m2 , BMI Body mass index is 29.62 kg/(m^2). GEN: Well nourished, well developed, in no acute distress HEENT: normal Neck: no JVD, carotid bruits, or masses Cardiac: RRR; no murmurs, rubs. S4 gallop is present.. No edema  Respiratory:  clear to auscultation bilaterally, normal work of breathing GI: soft, nontender, nondistended, + BS MS: no deformity or atrophy Skin: warm and dry, no rash Neuro:  Strength and sensation are intact Psych: euthymic mood, full affect   EKG:  EKG is ordered today. The ekg ordered today demonstrates  Sinus rhythm, biatrial abnormality, left bundle branch block unchanged from prior tracing.   Recent  Labs: 08/20/2014: ALT 30 09/27/2014: BUN 22; Creatinine 1.2; Potassium 4.7; Sodium 136; TSH 1.47 10/11/2014: Hemoglobin 13.4    Lipid Panel    Component Value Date/Time   CHOL 120 07/07/2009 0857   TRIG 145 08/20/2014 0750   TRIG 74.0 07/07/2009 0857   HDL 30.80* 07/07/2009 0857   CHOLHDL 4 07/07/2009 0857   VLDL 14.8 07/07/2009 0857   LDLCALC 81 08/20/2014 0750   LDLCALC 74 07/07/2009 0857      Wt Readings from Last 3 Encounters:  01/24/15 203 lb 6.4 oz (92.262 kg)  11/22/14 201 lb (91.173 kg)  10/11/14 202 lb (91.627 kg)      Other studies Reviewed: Additional studies/ records that were reviewed today include:  [facility] records which demonstrated LVEF between 30 and 45% since 2002.. Review of the above records demonstrates:  Most recent LVEF was by MRI and was 45%. This was done in 2009.   ASSESSMENT AND PLAN:  1.   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 Most recent EF 45% in 2009. No overt heart phase symptoms currently. We will reassess LV function just to ensure that there is not silent progressive dysfunction. He is on beta blocker and ACE inhibitor therapy.   2. Hypertension, controlled. Continue same medical regimen.   3. Chronic left bundle branch block, unchanged   4. Nonobstructive coronary disease by angiography 2002   Current medicines are reviewed at length with the patient today.  The patient does not have concerns regarding medicines.  The following changes have been made:   Because he is concerned about the status of LV function, we will perform a 2-D Doppler echocardiogram to ensure that there is no progression of systolic dysfunction.  Labs/ tests ordered today include:   Orders Placed This Encounter  Procedures  . EKG 12-Lead  . 2D Echocardiogram without contrast     Disposition:   FU with  Linard Millers in 6 months   Signed, Sinclair Grooms, MD  01/24/2015 3:42 PM    [facility] [place], [place], [place]  [address] Phone: [phone]; Fax: [phone]

## 2015-01-24 NOTE — Patient Instructions (Signed)

## 2015-01-27 ENCOUNTER — Ambulatory Visit (HOSPITAL_COMMUNITY): Payer: Medicare Other | Attending: Interventional Cardiology | Admitting: Cardiology

## 2015-01-27 DIAGNOSIS — I5042 Chronic combined systolic (congestive) and diastolic (congestive) heart failure: Secondary | ICD-10-CM | POA: Diagnosis not present

## 2015-01-27 NOTE — Progress Notes (Signed)
Echo performed. 

## 2015-01-29 ENCOUNTER — Telehealth: Payer: Self-pay

## 2015-01-29 NOTE — Telephone Encounter (Signed)
-----   Message from Sinclair Grooms, MD sent at 01/28/2015  7:13 PM EST ----- EF 40-45% and no real change/deterioration since prior study. Continue same therapy.

## 2015-01-29 NOTE — Telephone Encounter (Signed)
Pt aware of echo results.  EF 40-45% and no real change/deterioration since prior study. Continue same therapy.  Pt verbalized understanding.

## 2015-01-31 ENCOUNTER — Telehealth: Payer: Self-pay | Admitting: Interventional Cardiology

## 2015-01-31 NOTE — Telephone Encounter (Signed)
Calling requesting copy of Echo. States on my chart does not have any results.  Will mail him a copy.

## 2015-01-31 NOTE — Telephone Encounter (Signed)
New message  ° ° °Patient calling for test results.   °

## 2015-02-04 ENCOUNTER — Encounter: Payer: Self-pay | Admitting: Internal Medicine

## 2015-02-04 ENCOUNTER — Other Ambulatory Visit: Payer: Self-pay | Admitting: Internal Medicine

## 2015-02-04 MED ORDER — LORAZEPAM 1 MG PO TABS: ORAL_TABLET | ORAL | Status: DC

## 2015-02-14 ENCOUNTER — Other Ambulatory Visit: Payer: Self-pay

## 2015-02-14 NOTE — Telephone Encounter (Signed)
OK X1 

## 2015-02-17 MED ORDER — VARDENAFIL HCL 20 MG PO TABS: ORAL_TABLET | ORAL | Status: DC

## 2015-02-20 ENCOUNTER — Other Ambulatory Visit: Payer: Self-pay

## 2015-02-20 MED ORDER — VARDENAFIL HCL 20 MG PO TABS: ORAL_TABLET | ORAL | Status: DC

## 2015-02-20 NOTE — Telephone Encounter (Signed)
Okay for refill? MD out of office

## 2015-02-24 ENCOUNTER — Other Ambulatory Visit: Payer: Self-pay | Admitting: Internal Medicine

## 2015-02-25 ENCOUNTER — Other Ambulatory Visit: Payer: Medicare Other

## 2015-03-17 ENCOUNTER — Other Ambulatory Visit: Payer: Self-pay | Admitting: Internal Medicine

## 2015-03-18 ENCOUNTER — Other Ambulatory Visit: Payer: Self-pay

## 2015-03-18 ENCOUNTER — Ambulatory Visit (INDEPENDENT_AMBULATORY_CARE_PROVIDER_SITE_OTHER): Payer: Medicare Other | Admitting: Internal Medicine

## 2015-03-18 ENCOUNTER — Encounter: Payer: Self-pay | Admitting: Internal Medicine

## 2015-03-18 VITALS — BP 118/72 | HR 75 | Temp 97.8°F | Ht 70.0 in | Wt 211.0 lb

## 2015-03-18 DIAGNOSIS — J4521 Mild intermittent asthma with (acute) exacerbation: Secondary | ICD-10-CM

## 2015-03-18 MED ORDER — AZITHROMYCIN 250 MG PO TABS: ORAL_TABLET | ORAL | Status: DC

## 2015-03-18 MED ORDER — HYDROCODONE-ACETAMINOPHEN 7.5-325 MG PO TABS: ORAL_TABLET | ORAL | Status: DC

## 2015-03-18 MED ORDER — PREDNISONE 20 MG PO TABS: 20.0000 mg | ORAL_TABLET | Freq: Two times a day (BID) | ORAL | Status: DC

## 2015-03-18 NOTE — Progress Notes (Signed)
   Subjective:    Patient ID: Jeffery [name], male    DOB: [birth year], 74 y.o.   MRN: [id number]  HPI  His symptoms began 03/14/15 as sneezing and scratchy throat. This progressed to a sore throat and rhinitis. This was associated with a nonproductive cough. He does feel that the cough is loose as of 03/16/15; but he is not producing any sputum to visualize. He's been using Xopenex every 6 hours for shortness of breath and wheezing. He also has had postnasal drainage but again has not visualized the secretions. FBS range 98-115.  Review of Systems Frontal headache, facial pain , nasal purulence, dental pain, sore throat , otic pain or otic discharge denied. No fever , chills or sweats.     Objective:   Physical Exam Pertinent or positive findings include : There is erythema of the right nasal septum.  Heart sounds are markedly distant.  He has diffuse inspiratory wheezing and rhonchi in all lung fields.  General appearance:Adequately nourished; no acute distress or increased work of breathing is present.   Lymphatic: No  lymphadenopathy about the head, neck, or axilla . Eyes: No conjunctival inflammation or lid edema is present. There is no scleral icterus. Ears:  External ear exam shows no significant lesions or deformities.  Otoscopic examination reveals clear canals, tympanic membranes are intact bilaterally without bulging, retraction, inflammation or discharge. Nose:  External nasal examination shows no deformity or inflammation. No septal dislocation or deviation.No obstruction to airflow.  Oral exam: Dental hygiene is good; lips and gums are healthy appearing.There is no oropharyngeal erythema or exudate . Neck:  No deformities, thyromegaly, masses, or tenderness noted.   Supple with full range of motion without pain.  Heart:  Normal rate and regular rhythm. S1 and S2 normal without gallop, murmur, click, rub or other extra sounds.  Extremities:  No cyanosis, edema, or clubbing  noted   Skin: Warm & dry w/o tenting or jaundice. No significant lesions or rash.       Assessment & Plan:  #1 asthmatic bronchitis  Plan: See orders & recommendations

## 2015-03-18 NOTE — Patient Instructions (Signed)
Chest x-ray is recommended if there is not dramatic improvement in the next 48 hours

## 2015-03-18 NOTE — Progress Notes (Signed)
Pre visit review using our clinic review tool, if applicable. No additional management support is needed unless otherwise documented below in the visit note. 

## 2015-03-25 ENCOUNTER — Ambulatory Visit (INDEPENDENT_AMBULATORY_CARE_PROVIDER_SITE_OTHER)
Admission: RE | Admit: 2015-03-25 | Discharge: 2015-03-25 | Disposition: A | Discharge status: Home / Self Care | Payer: Medicare Other | Source: Ambulatory Visit | Attending: Internal Medicine | Admitting: Internal Medicine

## 2015-03-25 ENCOUNTER — Ambulatory Visit (INDEPENDENT_AMBULATORY_CARE_PROVIDER_SITE_OTHER): Payer: Medicare Other | Admitting: Internal Medicine

## 2015-03-25 ENCOUNTER — Other Ambulatory Visit (INDEPENDENT_AMBULATORY_CARE_PROVIDER_SITE_OTHER): Payer: Medicare Other

## 2015-03-25 ENCOUNTER — Encounter: Payer: Self-pay | Admitting: Internal Medicine

## 2015-03-25 VITALS — BP 130/82 | HR 72 | Temp 98.0°F | Resp 16 | Ht 70.0 in | Wt 205.5 lb

## 2015-03-25 DIAGNOSIS — J4541 Moderate persistent asthma with (acute) exacerbation: Secondary | ICD-10-CM | POA: Diagnosis not present

## 2015-03-25 DIAGNOSIS — R05 Cough: Secondary | ICD-10-CM | POA: Diagnosis not present

## 2015-03-25 DIAGNOSIS — R0602 Shortness of breath: Secondary | ICD-10-CM | POA: Diagnosis not present

## 2015-03-25 LAB — CBC WITH DIFFERENTIAL/PLATELET
BASOS PCT: 0.7 % (ref 0.0–3.0)
Basophils Absolute: 0.1 10*3/uL (ref 0.0–0.1)
EOS ABS: 0.1 10*3/uL (ref 0.0–0.7)
EOS PCT: 0.6 % (ref 0.0–5.0)
HCT: 44.8 % (ref 39.0–52.0)
HEMOGLOBIN: 14.9 g/dL (ref 13.0–17.0)
Lymphocytes Relative: 12.5 % (ref 12.0–46.0)
Lymphs Abs: 1.3 10*3/uL (ref 0.7–4.0)
MCHC: 33.1 g/dL (ref 30.0–36.0)
MCV: 85.1 fl (ref 78.0–100.0)
Monocytes Absolute: 0.9 10*3/uL (ref 0.1–1.0)
Monocytes Relative: 8.8 % (ref 3.0–12.0)
NEUTROS ABS: 8.3 10*3/uL — AB (ref 1.4–7.7)
NEUTROS PCT: 77.4 % — AB (ref 43.0–77.0)
Platelets: 379 10*3/uL (ref 150.0–400.0)
RBC: 5.27 Mil/uL (ref 4.22–5.81)
RDW: 15.1 % (ref 11.5–15.5)
WBC: 10.8 10*3/uL — ABNORMAL HIGH (ref 4.0–10.5)

## 2015-03-25 MED ORDER — PREDNISONE 10 MG (21) PO TBPK: 10.0000 mg | ORAL_TABLET | Freq: Every day | ORAL | Status: DC

## 2015-03-25 MED ORDER — LEVOFLOXACIN 500 MG PO TABS: 500.0000 mg | ORAL_TABLET | Freq: Every day | ORAL | Status: DC

## 2015-03-25 NOTE — Patient Instructions (Signed)
Plain Mucinex (NOT D) for thick secretions ;force NON dairy fluids .   Nasal cleansing in the shower as discussed with lather of mild shampoo.After 10 seconds wash off lather while  exhaling through nostrils. Make sure that all residual soap is removed to prevent irritation.  Flonase OR Nasacort AQ 1 spray in each nostril twice a day as needed. Use the "crossover" technique into opposite nostril spraying toward opposite ear @ 45 degree angle, not straight up into nostril.  Plain Allegra (NOT D )  160 daily , Loratidine 10 mg , OR Zyrtec 10 mg @ bedtime  as needed for itchy eyes & sneezing.  Your next office appointment will be determined based upon review of your pending labs & xrays  Those instructions will be transmitted to you by My Chart Critical results will be called.   Followup as needed for any active or acute issue. Please report any significant change in your symptoms.

## 2015-03-25 NOTE — Progress Notes (Signed)
   Subjective:    Patient ID: Jeffery [name], male    DOB: 09-03-1941, 74 y.o.   MRN: [id number]  HPI He states that he was 80% better with the prednisone and Z-Pak until last night when the asthma flared again. He had to use his nebulizer @ 3 AM this morning. 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  He continues to cough but cannot expectorate the sputum to be  visualized. He has postnasal drainage. He denies other extrinsic symptoms or upper respiratory tract symptoms.  He has been using the [facility] as maintenance.  With the steroids his fasting blood sugars did go above 200, max was 230. It was 135 this morning.  Review of Systems  Frontal headache, facial pain , nasal purulence, dental pain, sore throat , otic pain or otic discharge denied. No fever , chills or sweats. Extrinsic symptoms of itchy, watery eyes, sneezing, or angioedema are denied.      Objective:   Physical Exam  Pertinent or positive findings include : He has mild-moderate erythema of nares.  Heart sounds are distant.  He has harsh wheezing and rhonchi in all lung fields. He has a harsh cough which is nonproductive.  General appearance:Adequately nourished; no acute distress or increased work of breathing is present despite wheezing. Lymphatic: No  lymphadenopathy about the head, neck, or axilla . Eyes: No conjunctival inflammation or lid edema is present. There is no scleral icterus. Ears:  External ear exam shows no significant lesions or deformities.  Otoscopic examination reveals clear canals, tympanic membranes are intact bilaterally without bulging, retraction, inflammation or discharge. Nose:  External nasal examination shows no deformity or inflammation. No septal dislocation or deviation.No obstruction to airflow.  Oral exam: Dental hygiene is good; lips and gums are healthy appearing.There is no oropharyngeal erythema or  exudate . Neck:  No deformities, thyromegaly, masses, or tenderness noted.   Supple with full range of motion without pain.  Heart:  Normal rate and regular rhythm. S1 and S2 normal without gallop, murmur, click, rub or other extra sounds.  Extremities:  No cyanosis, edema, or clubbing  noted  Skin: Warm & dry w/o tenting or jaundice. No significant lesions or rash.       Assessment & Plan:  #1 asthmatic exacerbation in the context of exposure to pneumonia in his grandson 82 DM, hyperglycemia exacerbation with oral steroids  Plan: See orders and recommendations

## 2015-03-25 NOTE — Progress Notes (Signed)
Pre visit review using our clinic review tool, if applicable. No additional management support is needed unless otherwise documented below in the visit note. 

## 2015-03-29 ENCOUNTER — Encounter: Payer: Self-pay | Admitting: Internal Medicine

## 2015-03-29 ENCOUNTER — Other Ambulatory Visit: Payer: Self-pay | Admitting: Internal Medicine

## 2015-03-31 ENCOUNTER — Other Ambulatory Visit: Payer: Self-pay

## 2015-03-31 MED ORDER — LORAZEPAM 1 MG PO TABS: ORAL_TABLET | ORAL | Status: DC

## 2015-03-31 NOTE — Telephone Encounter (Signed)
Called refill into [facility] spoke with Vita gave md approval.../lmb

## 2015-03-31 NOTE — Addendum Note (Signed)
Addended by: Earnstine Regal on: 03/31/2015 01:21 PM   Modules accepted: Orders

## 2015-04-01 ENCOUNTER — Telehealth: Payer: Self-pay | Admitting: Internal Medicine

## 2015-04-01 MED ORDER — BUDESONIDE-FORMOTEROL FUMARATE 160-4.5 MCG/ACT IN AERO: 1.0000 | INHALATION_SPRAY | Freq: Two times a day (BID) | RESPIRATORY_TRACT | Status: DC | PRN

## 2015-04-01 NOTE — Telephone Encounter (Signed)
No .That was to have been sent to his local pharamcy , not mail order

## 2015-04-01 NOTE — Telephone Encounter (Signed)
Dr Linna Darner, was patient supposed to receive this prescription?

## 2015-04-01 NOTE — Telephone Encounter (Signed)
Pt called in and said that he just received levofloxacin (LEVAQUIN) 500 MG tablet [[id number]]  Pt was not aware that he was suppose to take this med so he was very surprised when he received it in the mail?    Best number [phone]

## 2015-04-01 NOTE — Telephone Encounter (Signed)
Phone call to patient. Per Dr Linna Darner he is to complete the antibiotic. I apologized for it being sent to his mail order and not the local pharmacy.

## 2015-05-15 ENCOUNTER — Other Ambulatory Visit: Payer: Self-pay | Admitting: Internal Medicine

## 2015-05-15 MED ORDER — LORAZEPAM 1 MG PO TABS: ORAL_TABLET | ORAL | Status: DC

## 2015-05-15 NOTE — Telephone Encounter (Signed)
[facility].../lmb 

## 2015-06-03 ENCOUNTER — Other Ambulatory Visit: Payer: Self-pay | Admitting: Emergency Medicine

## 2015-06-03 ENCOUNTER — Other Ambulatory Visit: Payer: Self-pay | Admitting: Internal Medicine

## 2015-06-03 MED ORDER — HYDROCODONE-ACETAMINOPHEN 7.5-325 MG PO TABS: ORAL_TABLET | ORAL | Status: DC

## 2015-06-11 ENCOUNTER — Other Ambulatory Visit: Payer: Self-pay | Admitting: Internal Medicine

## 2015-06-11 NOTE — Telephone Encounter (Signed)
singulair and metformin rx sent to pharm

## 2015-07-22 ENCOUNTER — Other Ambulatory Visit: Payer: Self-pay | Admitting: Internal Medicine

## 2015-07-23 ENCOUNTER — Other Ambulatory Visit: Payer: Self-pay | Admitting: Emergency Medicine

## 2015-07-23 MED ORDER — LORAZEPAM 1 MG PO TABS: ORAL_TABLET | ORAL | Status: DC

## 2015-07-23 NOTE — Telephone Encounter (Signed)
Refill faxed to pharm for ativan

## 2015-08-27 ENCOUNTER — Other Ambulatory Visit: Payer: Self-pay | Admitting: Internal Medicine

## 2015-09-03 ENCOUNTER — Other Ambulatory Visit: Payer: Self-pay | Admitting: Emergency Medicine

## 2015-09-03 ENCOUNTER — Other Ambulatory Visit: Payer: Self-pay | Admitting: Internal Medicine

## 2015-09-03 MED ORDER — HYDROCODONE-ACETAMINOPHEN 7.5-325 MG PO TABS: ORAL_TABLET | ORAL | Status: DC

## 2015-09-05 ENCOUNTER — Other Ambulatory Visit: Payer: Self-pay | Admitting: Internal Medicine

## 2015-09-08 ENCOUNTER — Other Ambulatory Visit: Payer: Self-pay | Admitting: Internal Medicine

## 2015-09-12 ENCOUNTER — Encounter: Payer: Self-pay | Admitting: Internal Medicine

## 2015-09-12 ENCOUNTER — Other Ambulatory Visit (INDEPENDENT_AMBULATORY_CARE_PROVIDER_SITE_OTHER): Payer: Medicare Other

## 2015-09-12 ENCOUNTER — Ambulatory Visit (HOSPITAL_COMMUNITY)
Admission: RE | Admit: 2015-09-12 | Discharge: 2015-09-12 | Disposition: A | Discharge status: Home / Self Care | Payer: Medicare Other | Source: Ambulatory Visit | Attending: Internal Medicine | Admitting: Internal Medicine

## 2015-09-12 ENCOUNTER — Other Ambulatory Visit: Payer: Self-pay | Admitting: Internal Medicine

## 2015-09-12 ENCOUNTER — Ambulatory Visit (INDEPENDENT_AMBULATORY_CARE_PROVIDER_SITE_OTHER): Payer: Medicare Other | Admitting: Internal Medicine

## 2015-09-12 VITALS — BP 124/76 | HR 67 | Temp 97.7°F | Resp 16 | Wt 201.0 lb

## 2015-09-12 DIAGNOSIS — E1151 Type 2 diabetes mellitus with diabetic peripheral angiopathy without gangrene: Secondary | ICD-10-CM

## 2015-09-12 DIAGNOSIS — J984 Other disorders of lung: Secondary | ICD-10-CM | POA: Insufficient documentation

## 2015-09-12 DIAGNOSIS — J209 Acute bronchitis, unspecified: Secondary | ICD-10-CM

## 2015-09-12 DIAGNOSIS — Z23 Encounter for immunization: Secondary | ICD-10-CM | POA: Diagnosis not present

## 2015-09-12 DIAGNOSIS — J45901 Unspecified asthma with (acute) exacerbation: Secondary | ICD-10-CM

## 2015-09-12 DIAGNOSIS — J45909 Unspecified asthma, uncomplicated: Secondary | ICD-10-CM | POA: Diagnosis not present

## 2015-09-12 DIAGNOSIS — R05 Cough: Secondary | ICD-10-CM | POA: Insufficient documentation

## 2015-09-12 DIAGNOSIS — E1159 Type 2 diabetes mellitus with other circulatory complications: Secondary | ICD-10-CM

## 2015-09-12 LAB — HEMOGLOBIN A1C: Hgb A1c MFr Bld: 6.6 % — ABNORMAL HIGH (ref 4.6–6.5)

## 2015-09-12 MED ORDER — LORAZEPAM 1 MG PO TABS: ORAL_TABLET | ORAL | Status: DC

## 2015-09-12 MED ORDER — AMOXICILLIN 500 MG PO CAPS: 500.0000 mg | ORAL_CAPSULE | Freq: Three times a day (TID) | ORAL | Status: DC

## 2015-09-12 MED ORDER — PREDNISONE 10 MG PO TABS: ORAL_TABLET | ORAL | Status: DC

## 2015-09-12 NOTE — Progress Notes (Signed)
Pre visit review using our clinic review tool, if applicable. No additional management support is needed unless otherwise documented below in the visit note. 

## 2015-09-12 NOTE — Patient Instructions (Signed)

## 2015-09-12 NOTE — Progress Notes (Signed)
   Subjective:    Patient ID: Jeffery [name], male    DOB: Apr 30, 1941, 74 y.o.   MRN: [id number]  HPI His symptoms began 3 weeks ago as sore throat and scratchy throat which lasted 24 hours. He then began to have rhinitis which persisted for 5-7 days. This began after he was exposed to his 20 month old grandson who had an upper respiratory tract infection. His grandchildren live with him. For 2 weeks he has had chest congestion and wheezing with a productive cough but nonvisualized sputum. He feels his shortness of breath is not significantly increased beyond baseline. He's been able to mow the grass recently. He's used Mucinex, NyQuil, Xopenex on average twice a day and Symbicort 2 puffs twice a day.  He had a similar episode in April which resolved after courses of Z-Pak and Levaquin.  Review of Systems  He denies frontal headache, facial pain, nasal purulence, otic pain, otic discharge. He also denies fever, chills, or sweats.   He did not return in February for his A1c. He states his sugars range 99-108. He does feel different if his sugar drops below 90. He denies any frank hypoglycemic spells. He does not have excessive thirst, hunger, or urination. He has no numbness, tingling, burning in his extremities. There are no nonhealing skin lesions.     Objective:   Physical Exam  He has pattern alopecia. He has a mustache. There is erythema of the nasal mucosa. Heart sounds are distant. He has harsh expiratory wheezing in all lung fields.  General appearance:Adequately nourished; no acute distress or increased work of breathing is present.    Lymphatic: No  lymphadenopathy about the head, neck, or axilla .  Eyes: No conjunctival inflammation or lid edema is present. There is no scleral icterus.  Ears:  External ear exam shows no significant lesions or deformities.  Otoscopic examination reveals clear canals, tympanic membranes are intact bilaterally without bulging, retraction,  inflammation or discharge.  Nose:  External nasal examination shows no deformity or inflammation. No septal dislocation or deviation.No obstruction to airflow.   Oral exam: Dental hygiene is good; lips and gums are healthy appearing.There is no oropharyngeal erythema or exudate .  Neck:  No deformities, thyromegaly, masses, or tenderness noted.   Supple with full range of motion without pain.   Heart:  Normal rate and regular rhythm. S1 and S2 normal without gallop, murmur, click, rub or other extra sounds.   Lungs:No increased work of breathing. Extremities:  No cyanosis, edema, or clubbing  noted    Skin: Warm & dry w/o tenting or jaundice. No significant lesions or rash.      Assessment & Plan:  #1 acute bronchitis with asthma with acute exacerbation  #2 diabetes, questionable control  Plan: See orders and recommendations

## 2015-09-14 ENCOUNTER — Other Ambulatory Visit: Payer: Self-pay | Admitting: Internal Medicine

## 2015-09-14 ENCOUNTER — Other Ambulatory Visit: Payer: Self-pay | Admitting: Interventional Cardiology

## 2015-09-15 ENCOUNTER — Other Ambulatory Visit: Payer: Self-pay | Admitting: Emergency Medicine

## 2015-09-15 MED ORDER — ACCU-CHEK AVIVA PLUS W/DEVICE KIT: PACK | Status: DC

## 2015-09-15 MED ORDER — ACCU-CHEK SOFTCLIX LANCETS MISC: Status: DC

## 2015-09-15 MED ORDER — GLUCOSE BLOOD VI STRP: ORAL_STRIP | Status: DC

## 2015-09-23 NOTE — Telephone Encounter (Signed)
Duplicate request already taking care of...Jeffery [name]

## 2015-09-23 NOTE — Telephone Encounter (Signed)
OK X1 if it is not early

## 2015-09-23 NOTE — Telephone Encounter (Signed)
Yes already done duplicate request.../lmb

## 2015-10-23 DIAGNOSIS — E119 Type 2 diabetes mellitus without complications: Secondary | ICD-10-CM | POA: Diagnosis not present

## 2015-10-31 ENCOUNTER — Other Ambulatory Visit: Payer: Self-pay | Admitting: Interventional Cardiology

## 2015-10-31 ENCOUNTER — Other Ambulatory Visit: Payer: Self-pay | Admitting: Internal Medicine

## 2015-11-02 ENCOUNTER — Other Ambulatory Visit: Payer: Self-pay | Admitting: Internal Medicine

## 2015-11-03 ENCOUNTER — Other Ambulatory Visit: Payer: Self-pay | Admitting: Interventional Cardiology

## 2015-11-10 ENCOUNTER — Other Ambulatory Visit: Payer: Self-pay

## 2015-11-10 MED ORDER — LORAZEPAM 1 MG PO TABS: 0.5000 mg | ORAL_TABLET | Freq: Every day | ORAL | Status: DC

## 2015-11-18 DIAGNOSIS — M25561 Pain in right knee: Secondary | ICD-10-CM | POA: Diagnosis not present

## 2015-11-18 DIAGNOSIS — M2391 Unspecified internal derangement of right knee: Secondary | ICD-10-CM | POA: Diagnosis not present

## 2015-12-10 ENCOUNTER — Encounter: Payer: Self-pay | Admitting: Internal Medicine

## 2015-12-10 ENCOUNTER — Ambulatory Visit (INDEPENDENT_AMBULATORY_CARE_PROVIDER_SITE_OTHER): Payer: Medicare Other | Admitting: Internal Medicine

## 2015-12-10 VITALS — BP 138/88 | HR 74 | Temp 98.0°F | Resp 20 | Ht 69.0 in | Wt 203.5 lb

## 2015-12-10 DIAGNOSIS — R0789 Other chest pain: Secondary | ICD-10-CM

## 2015-12-10 DIAGNOSIS — E1159 Type 2 diabetes mellitus with other circulatory complications: Secondary | ICD-10-CM | POA: Diagnosis not present

## 2015-12-10 NOTE — Patient Instructions (Signed)
Please have labs done in the next 6 weeks and follow-up with Dr Quay Burow

## 2015-12-10 NOTE — Progress Notes (Signed)
   Subjective:    Patient ID: Jeffery [name], male    DOB: June 29, 1941, 74 y.o.   MRN: [id number]  HPI   He has increased his sweet intake over the holidays. Fasting blood sugars have ranged from 109-120. He has numbness in his feet but denies any other possible diabetic symptoms. He has updated his ophthalmologic evaluation  & has no retinopathy.  Otherwise he remains on a heart healthy diet. He's active caring for 2 grandchildren. He walks half a mile per day. His tracking device states that he averages 6000 steps per day. He does have some exertional dyspnea due to his chronic lung disease.   He fell recently in a mechanical fall and landed on his left side. There was no cardiac or neurologic prodrome. Subsequent to that he developed a respiratory tract infection with associated pleuritic pain. The symptoms are improving.   Review of Systems He denies polyuria, polydipsia, polyphagia.  He has no hypoglycemia. There are no nonhealing skin lesions.  Chest pain, palpitations, tachycardia,paroxysmal nocturnal dyspnea, claudication or edema are absent.      Objective:   Physical Exam  Pertinent or positive findings include: He has bilateral ptosis. There is point tenderness over the left inferior posterior axillary line. No pain with chest compression ant-post or side to side.A ventral hernia is present. He has slight hyperpigmentation of the the large toenails.  General appearance :adequately nourished; in no distress.  Eyes: No conjunctival inflammation or scleral icterus is present.  Oral exam:  Lips and gums are healthy appearing.There is no oropharyngeal erythema or exudate noted. Dental hygiene is good.  Heart:  Normal rate and regular rhythm. S1 and S2 normal without gallop, murmur, click, rub or other extra sounds    Lungs:Chest clear to auscultation; no wheezes, rhonchi,rales ,or rubs present.No increased work of breathing.   Abdomen: bowel sounds normal, soft and  non-tender without masses, or organomegaly  noted.  No guarding or rebound.  Vascular : all pulses equal ; no bruits present.  Skin:Warm & dry.  Intact without suspicious lesions or rashes ; no tenting or jaundice   Lymphatic: No lymphadenopathy is noted about the head, neck, axilla  Neuro: Strength, tone & DTRs normal. Sensation is intact over the feet     Assessment & Plan:  #1 diabetes; A1c is due next month  #2 residual chest wall pain sustained in a mechanical fall. Clinically there is no evidence of displaced fracture and subjectively he is improving. Radiograph is not indicated.

## 2015-12-10 NOTE — Progress Notes (Signed)
Pre visit review using our clinic review tool, if applicable. No additional management support is needed unless otherwise documented below in the visit note. 

## 2015-12-12 ENCOUNTER — Other Ambulatory Visit: Payer: Self-pay | Admitting: Interventional Cardiology

## 2016-01-24 ENCOUNTER — Other Ambulatory Visit: Payer: Self-pay | Admitting: Interventional Cardiology

## 2016-02-01 ENCOUNTER — Other Ambulatory Visit: Payer: Self-pay | Admitting: Internal Medicine

## 2016-02-11 ENCOUNTER — Other Ambulatory Visit: Payer: Self-pay | Admitting: Interventional Cardiology

## 2016-03-01 ENCOUNTER — Telehealth: Payer: Self-pay | Admitting: Internal Medicine

## 2016-03-01 DIAGNOSIS — E1159 Type 2 diabetes mellitus with other circulatory complications: Secondary | ICD-10-CM

## 2016-03-01 NOTE — Telephone Encounter (Signed)
Labs have been reeneterd, pt has been informed.

## 2016-03-01 NOTE — Telephone Encounter (Signed)
Patient called to check on lab orders. Dr hopper advised him to go get labs before his visit with you. They expired on 02/10/2016. Please enter new updated labs for the patient to get before his visit on this date

## 2016-03-02 ENCOUNTER — Other Ambulatory Visit (INDEPENDENT_AMBULATORY_CARE_PROVIDER_SITE_OTHER): Payer: Medicare Other

## 2016-03-02 DIAGNOSIS — E1159 Type 2 diabetes mellitus with other circulatory complications: Secondary | ICD-10-CM | POA: Diagnosis not present

## 2016-03-02 LAB — HEMOGLOBIN A1C: HEMOGLOBIN A1C: 6.9 % — AB (ref 4.6–6.5)

## 2016-03-02 LAB — BASIC METABOLIC PANEL
BUN: 18 mg/dL (ref 6–23)
CHLORIDE: 102 meq/L (ref 96–112)
CO2: 31 mEq/L (ref 19–32)
CREATININE: 1.21 mg/dL (ref 0.40–1.50)
Calcium: 9.8 mg/dL (ref 8.4–10.5)
GFR: 62.56 mL/min (ref >60.00)
Glucose, Bld: 116 mg/dL — ABNORMAL HIGH (ref 70–99)
POTASSIUM: 4.2 meq/L (ref 3.5–5.1)
Sodium: 139 mEq/L (ref 135–145)

## 2016-03-02 LAB — MICROALBUMIN / CREATININE URINE RATIO
CREATININE, U: 169.4 mg/dL
MICROALB UR: 1.2 mg/dL (ref 0.0–1.9)
Microalb Creat Ratio: 0.7 mg/g (ref 0.0–30.0)

## 2016-03-04 ENCOUNTER — Encounter: Payer: Self-pay | Admitting: Internal Medicine

## 2016-03-04 ENCOUNTER — Ambulatory Visit (INDEPENDENT_AMBULATORY_CARE_PROVIDER_SITE_OTHER): Payer: Medicare Other | Admitting: Internal Medicine

## 2016-03-04 VITALS — BP 134/84 | HR 59 | Temp 97.7°F | Resp 16 | Ht 69.0 in | Wt 203.0 lb

## 2016-03-04 DIAGNOSIS — I2510 Atherosclerotic heart disease of native coronary artery without angina pectoris: Secondary | ICD-10-CM

## 2016-03-04 DIAGNOSIS — E1159 Type 2 diabetes mellitus with other circulatory complications: Secondary | ICD-10-CM | POA: Diagnosis not present

## 2016-03-04 DIAGNOSIS — Z23 Encounter for immunization: Secondary | ICD-10-CM | POA: Diagnosis not present

## 2016-03-04 DIAGNOSIS — E782 Mixed hyperlipidemia: Secondary | ICD-10-CM

## 2016-03-04 DIAGNOSIS — J45909 Unspecified asthma, uncomplicated: Secondary | ICD-10-CM

## 2016-03-04 DIAGNOSIS — I10 Essential (primary) hypertension: Secondary | ICD-10-CM

## 2016-03-04 DIAGNOSIS — I5042 Chronic combined systolic (congestive) and diastolic (congestive) heart failure: Secondary | ICD-10-CM

## 2016-03-04 NOTE — Progress Notes (Signed)
Pre visit review using our clinic review tool, if applicable. No additional management support is needed unless otherwise documented below in the visit note. 

## 2016-03-04 NOTE — Progress Notes (Signed)
Subjective:    Patient ID: Jeffery [name], male    DOB: [birth year], 75 y.o.   MRN: [id number]  HPI He is here to establish with a new pcp.   He is here for follow up.   CAD, Hypertension, CHF: He is taking his medication daily. He is compliant with a low sodium diet.  He denies chest pain, palpitations, edema, shortness of breath and regular headaches. He is not exercising regularly.  He does not monitor his blood pressure at home.    Diabetes: He is taking his medication daily as prescribed. He is not compliant with a diabetic diet. He is not exercising regularly. He monitors his sugars periodically. He checks his feet daily and denies foot lesions. He is up-to-date with an ophthalmology examination.   Hyperlipidemia: He is taking his medication daily. He is somewhat compliant with a low fat/cholesterol diet. He is not exercising regularly. He denies myalgias.   OSA:  Does not use cpap.  Side sleeper so less symptomatic.   Asthma:  He uses his symbicort 2 puffs in the morning and 1 puff in the evening.   He uses his albuterol inhaler as needed.  He does develop more severe asthma with cold symptoms and has needed steroids and antibiotics for those episodes. He quickly developed asthmatic bronchitis  Sleep difficulty: He takes the ativan 0.5 mg at bedtime most nights. It turns off apart of his mind.  Arthritic pain: Uses hydrocodone for back pain or arhtirtis, shouler injuries if needed.  He has taken it for years and ony uses it sparinlgly.     Medications and allergies reviewed with patient and updated if appropriate.  Patient Active Problem List   Diagnosis Date Noted  . Chronic combined systolic and diastolic CHF (congestive heart failure) ([place]) 01/24/2015  . OSA (obstructive sleep apnea) 10/03/2014  . CTS (carpal tunnel syndrome) 09/27/2014  . Left bundle branch block 11/27/2013  . Essential hypertension 11/27/2013  . GERD (gastroesophageal reflux disease) 02/27/2013  .  Type 2 diabetes mellitus with vascular disease ([place]) 04/09/2009  . Mixed hyperlipidemia 09/06/2008  . Coronary atherosclerosis 09/06/2008  . ASTHMA 09/06/2008    Current Outpatient Prescriptions on File Prior to Visit  Medication Sig Dispense Refill  . ACCU-CHEK SOFTCLIX LANCETS lancets Use to test blood sugar daily as directed. 100 each 3  . albuterol (PROAIR HFA) 108 (90 BASE) MCG/ACT inhaler Inhale 2 puffs into the lungs as needed.      [name] aspirin 81 MG tablet Take 81 mg by mouth at bedtime.      . Blood Glucose Calibration (ACCU-CHEK AVIVA) SOLN Use as instructed to test blood sugar ICD 10 E11 51 1 each 3  . Blood Glucose Monitoring Suppl (ACCU-CHEK AVIVA PLUS) W/DEVICE KIT Use as directed 1 kit 0  . budesonide-formoterol (SYMBICORT) 160-4.5 MCG/ACT inhaler Inhale 1-2 puffs into the lungs every 12 (twelve) hours as needed. 3 Inhaler 3  . BYSTOLIC 5 MG tablet Take 1 tablet by mouth once daily 90 tablet 0  . Coenzyme Q10 (COQ10) 100 MG CAPS Take 200 mg by mouth daily.      . Colesevelam HCl 3.75 G PACK Take 3.75 g by mouth daily. Mix 1 packet with 4-8 ounces of water or juice and drink daily    . glucose blood (ACCU-CHEK AVIVA PLUS) test strip Use as instructed to test blood sugar daily ICD 10 E11 51 100 each 3  . HYDROcodone-acetaminophen (NORCO) 7.5-325 MG per tablet 1 q 6 hrs  prn only 30 tablet 0  . Lancet Devices (AUTOLET IMPRESSION) MISC Use as instructed to test blood sugar ICD 10 E11 51 1 each 3  . levalbuterol (XOPENEX) 1.25 MG/3ML nebulizer solution Take 1 ampule by nebulization as needed. Use in nebulizer prn     . lisinopril-hydrochlorothiazide (PRINZIDE,ZESTORETIC) 20-12.5 MG tablet Take 1 tablet by mouth once daily 30 tablet 0  . LORazepam (ATIVAN) 1 MG tablet Take 0.5-1 tablets (0.5-1 mg total) by mouth at bedtime. 30 tablet 1  . metFORMIN (GLUCOPHAGE) 500 MG tablet Take 1 tablet (500 mg total) by mouth daily. --patient establishing care with dr Anuj Summons in march/2017 90 tablet 0    . montelukast (SINGULAIR) 10 MG tablet Take 1 tablet by mouth  daily 90 tablet 0  . Multiple Vitamin (MULTIVITAMIN) tablet Take 1 tablet by mouth daily.     . ONE TOUCH LANCETS MISC by Does not apply route as directed. Check blood sugar 6 time per day     . rosuvastatin (CRESTOR) 10 MG tablet Take 10 mg by mouth once a week. Patient takes one tab by mouth on Wednesdays    . vardenafil (LEVITRA) 20 MG tablet TAKE 1 TABLET (20 MG TOTAL) BY MOUTH AS DIRECTED. CAN NOT BE TAKENWITH NITROGLYCERIN 6 tablet 0   No current facility-administered medications on file prior to visit.    Past Medical History  Diagnosis Date  . Asthma   . CAD (coronary artery disease)   . LBBB (left bundle branch block) 1999  . GERD (gastroesophageal reflux disease)   . Seasonal allergies   . HTN (hypertension)   . Hyperlipidemia   . Diabetes mellitus without complication ([place])   . Arthritis     Past Surgical History  Procedure Laterality Date  . Cardiac catheterization  2003    Dr Pernell Dupre; 85 % R circumflex obstruction  . Inguinal hernia repair      right, age 80  . Inguinal hernia repair      left age 54  . Lumbar laminectomy      age 55  . Tonsillectomy      age 12  . Carpal tunnel release Left 10/11/2014    Procedure: LEFT CARPAL TUNNEL RELEASE;  Surgeon: Roseanne Kaufman, MD;  Location: [facility];  Service: Orthopedics;  Laterality: Left;  [name] Minor carpal tunnel Right 11/22/2014    Procedure: RIGHT LIMITED OPEN CARPAL TUNNEL RELEASE;  Surgeon: Roseanne Kaufman, MD;  Location: [facility];  Service: Orthopedics;  Laterality: Right;    Social History   Social History  . Marital Status: Married    Spouse Name: N/A  . Number of Children: N/A  . Years of Education: N/A   Social History Main Topics  . Smoking status: Former Smoker    Quit date: 12/14/1963  . Smokeless tobacco: Not on file  . Alcohol Use: 1.2 oz/week    2 Glasses of wine per week     Comment: socially   . Drug Use: No  . Sexual Activity: Not on file   Other Topics Concern  . Not on file   Social History Narrative    Family History  Problem Relation Age of Onset  . Heart attack Father     54s  . Heart failure Mother     90s  . Subarachnoid hemorrhage Mother 54  . Hypertension Mother   . Subarachnoid hemorrhage Paternal Grandfather 42  . Diabetes Maternal Aunt     Review of Systems  Constitutional:  Negative for fever.  Respiratory: Negative for cough, shortness of breath and wheezing.   Cardiovascular: Negative for chest pain, palpitations and leg swelling.  Neurological: Negative for dizziness, light-headedness and headaches.       Objective:   Filed Vitals:   03/04/16 1050  BP: 134/84  Pulse: 59  Temp: 97.7 F (36.5 C)  Resp: 16   Filed Weights   03/04/16 1050  Weight: 203 lb (92.08 kg)   Body mass index is 29.96 kg/(m^2).   Physical Exam Constitutional: Appears well-developed and well-nourished. No distress.  Neck: Neck supple. No tracheal deviation present. No thyromegaly present.  No carotid bruit. No cervical adenopathy.   Cardiovascular: Normal rate, regular rhythm and normal, But distant heart sounds.   No murmur heard.  No edema Pulmonary/Chest: Effort normal and breath sounds normal. No respiratory distress. No wheezes.       Assessment & Plan:   See Problem List for Assessment and Plan of chronic medical problems.  Follow-up annually, blood work every 6 months to check his A1c

## 2016-03-04 NOTE — Assessment & Plan Note (Addendum)
Taking Crestor 10 mg daily Lipid panel controlled-need to recheck in 6 months

## 2016-03-04 NOTE — Assessment & Plan Note (Signed)
Asymptomatic, no fluid overload on exam Following with cardiology Increase exercise and work on weight loss Continue current medications

## 2016-03-04 NOTE — Assessment & Plan Note (Signed)
Controlled Continue current medication Stressed the importance of increasing his exercise, improving his diet and losing weight A1c every 6 months

## 2016-03-04 NOTE — Assessment & Plan Note (Signed)
Likely had a silent heart attack years ago-resulting in left bundle branch block No chest pain, palpitations, lightheadedness or shortness of breath Will work on increasing his exercise and weight loss Continue current medications

## 2016-03-04 NOTE — Assessment & Plan Note (Signed)
Blood pressure controlled-he states it is typically lower than it was today Continue current medications Work on exercise and weight loss

## 2016-03-04 NOTE — Patient Instructions (Addendum)
  Test(s) ordered today. Your results will be released to [facility] (or called to you) after review, usually within 72hours after test completion. If any changes need to be made, you will be notified at that same time.  All other Health Maintenance issues reviewed.   All recommended immunizations and age-appropriate screenings are up-to-date or discussed.  prevnar vaccine administered today.   Medications reviewed and updated.  No changes recommended at this time.   Please followup annually

## 2016-03-04 NOTE — Assessment & Plan Note (Signed)
Currently controlled with Symbicort daily and albuterol as needed

## 2016-03-05 ENCOUNTER — Other Ambulatory Visit: Payer: Self-pay | Admitting: Internal Medicine

## 2016-03-05 ENCOUNTER — Telehealth: Payer: Self-pay | Admitting: Emergency Medicine

## 2016-03-05 NOTE — Telephone Encounter (Signed)
RX for Ativan faxed to local [facility]

## 2016-03-05 NOTE — Telephone Encounter (Signed)
Please advise, thanks.

## 2016-03-12 ENCOUNTER — Other Ambulatory Visit: Payer: Self-pay | Admitting: Internal Medicine

## 2016-03-16 ENCOUNTER — Other Ambulatory Visit: Payer: Self-pay | Admitting: Internal Medicine

## 2016-03-16 ENCOUNTER — Other Ambulatory Visit: Payer: Self-pay | Admitting: Interventional Cardiology

## 2016-03-16 MED ORDER — HYDROCODONE-ACETAMINOPHEN 7.5-325 MG PO TABS: ORAL_TABLET | ORAL | Status: DC

## 2016-03-16 NOTE — Telephone Encounter (Signed)
A user error has taken place.

## 2016-03-16 NOTE — Telephone Encounter (Signed)
rx printed

## 2016-03-16 NOTE — Addendum Note (Signed)
Addended by: [name] on: 03/16/2016 08:44 PM   Modules accepted: Orders

## 2016-03-17 NOTE — Telephone Encounter (Signed)
Sent msg letting him know rx [facility] for pick-up. Place in cabinet for pick-up.../lm,b

## 2016-03-22 ENCOUNTER — Telehealth: Payer: Self-pay | Admitting: Internal Medicine

## 2016-03-22 NOTE — Telephone Encounter (Signed)
Pt called request for Dr. Quay Burow to send him an order requisition for colon grand ( pt said its has to be sign by her and DEA #). Please help

## 2016-03-23 NOTE — Telephone Encounter (Signed)
Spoke with pt to inform that Cologuard form has been faxed over.

## 2016-04-05 ENCOUNTER — Other Ambulatory Visit: Payer: Self-pay | Admitting: Interventional Cardiology

## 2016-04-13 ENCOUNTER — Other Ambulatory Visit: Payer: Self-pay | Admitting: *Deleted

## 2016-04-13 MED ORDER — LISINOPRIL-HYDROCHLOROTHIAZIDE 20-12.5 MG PO TABS: 1.0000 | ORAL_TABLET | Freq: Every day | ORAL | Status: DC

## 2016-04-15 NOTE — Telephone Encounter (Signed)
Error

## 2016-04-16 ENCOUNTER — Other Ambulatory Visit: Payer: Self-pay

## 2016-04-17 ENCOUNTER — Other Ambulatory Visit: Payer: Self-pay | Admitting: Internal Medicine

## 2016-04-19 MED ORDER — VARDENAFIL HCL 20 MG PO TABS: ORAL_TABLET | ORAL | Status: DC

## 2016-04-19 NOTE — Addendum Note (Signed)
Addended by: Earnstine Regal on: 04/19/2016 11:39 AM   Modules accepted: Orders

## 2016-04-21 ENCOUNTER — Encounter: Payer: Self-pay | Admitting: Gastroenterology

## 2016-04-27 ENCOUNTER — Other Ambulatory Visit: Payer: Self-pay | Admitting: Internal Medicine

## 2016-04-27 ENCOUNTER — Ambulatory Visit (AMBULATORY_SURGERY_CENTER): Payer: Self-pay | Admitting: *Deleted

## 2016-04-27 ENCOUNTER — Encounter: Payer: Self-pay | Admitting: Gastroenterology

## 2016-04-27 VITALS — Ht 69.0 in | Wt 203.2 lb

## 2016-04-27 DIAGNOSIS — Z1211 Encounter for screening for malignant neoplasm of colon: Secondary | ICD-10-CM

## 2016-04-27 MED ORDER — NA SULFATE-K SULFATE-MG SULF 17.5-3.13-1.6 GM/177ML PO SOLN: 1.0000 | Freq: Once | ORAL | Status: DC

## 2016-04-27 NOTE — Progress Notes (Signed)
No egg or soy allergy known to patient  No issues with past sedation with any surgeries  or procedures, no intubation problems  No diet pills per patient No home 02 use per patient  No blood thinners per patient  Pt denies issues with constipation   

## 2016-05-06 ENCOUNTER — Encounter: Payer: Self-pay | Admitting: Gastroenterology

## 2016-05-06 ENCOUNTER — Ambulatory Visit (AMBULATORY_SURGERY_CENTER): Payer: Medicare Other | Admitting: Gastroenterology

## 2016-05-06 VITALS — BP 98/47 | HR 59 | Temp 97.1°F | Resp 20 | Ht 69.0 in | Wt 203.0 lb

## 2016-05-06 DIAGNOSIS — D128 Benign neoplasm of rectum: Secondary | ICD-10-CM | POA: Diagnosis not present

## 2016-05-06 DIAGNOSIS — Z1211 Encounter for screening for malignant neoplasm of colon: Secondary | ICD-10-CM

## 2016-05-06 LAB — GLUCOSE, CAPILLARY
GLUCOSE-CAPILLARY: 147 mg/dL — AB (ref 65–99)
GLUCOSE-CAPILLARY: 126 mg/dL — AB (ref 65–99)

## 2016-05-06 MED ORDER — SODIUM CHLORIDE 0.9 % IV SOLN: 500.0000 mL | INTRAVENOUS | Status: DC

## 2016-05-06 NOTE — Patient Instructions (Signed)
YOU HAD AN ENDOSCOPIC PROCEDURE TODAY AT [facility]:   Refer to the procedure report that was given to you for any specific questions about what was found during the examination.  If the procedure report does not answer your questions, please call your gastroenterologist to clarify.  If you requested that your care partner not be given the details of your procedure findings, then the procedure report has been included in a sealed envelope for you to review at your convenience later.  YOU SHOULD EXPECT: Some feelings of bloating in the abdomen. Passage of more gas than usual.  Walking can help get rid of the air that was put into your GI tract during the procedure and reduce the bloating. If you had a lower endoscopy (such as a colonoscopy or flexible sigmoidoscopy) you may notice spotting of blood in your stool or on the toilet paper. If you underwent a bowel prep for your procedure, you may not have a normal bowel movement for a few days.  Please Note:  You might notice some irritation and congestion in your nose or some drainage.  This is from the oxygen used during your procedure.  There is no need for concern and it should clear up in a day or so.  SYMPTOMS TO REPORT IMMEDIATELY:   Following lower endoscopy (colonoscopy or flexible sigmoidoscopy):  Excessive amounts of blood in the stool  Significant tenderness or worsening of abdominal pains  Swelling of the abdomen that is new, acute  Fever of 100F or higher   For urgent or emergent issues, a gastroenterologist can be reached at any hour by calling [phone].   DIET: Your first meal following the procedure should be a small meal and then it is ok to progress to your normal diet. Heavy or fried foods are harder to digest and may make you feel nauseous or bloated.  Likewise, meals heavy in dairy and vegetables can increase bloating.  Drink plenty of fluids but you should avoid alcoholic beverages for 24 hours. Try to   eat a high fiber diet, and drink plenty of water.  ACTIVITY:  You should plan to take it easy for the rest of today and you should NOT DRIVE or use heavy machinery until tomorrow (because of the sedation medicines used during the test).    FOLLOW UP: Our staff will call the number listed on your records the next business day following your procedure to check on you and address any questions or concerns that you may have regarding the information given to you following your procedure. If we do not reach you, we will leave a message.  However, if you are feeling well and you are not experiencing any problems, there is no need to return our call.  We will assume that you have returned to your regular daily activities without incident.  If any biopsies were taken you will be contacted by phone or by letter within the next 1-3 weeks.  Please call us at [phone] if you have not heard about the biopsies in 3 weeks.    SIGNATURES/CONFIDENTIALITY: You and/or your care partner have signed paperwork which will be entered into your electronic medical record.  These signatures attest to the fact that that the information above on your After Visit Summary has been reviewed and is understood.  Full responsibility of the confidentiality of this discharge information lies with you and/or your care-partner.  Read all of the handouts given to you by your recovery  room nurse.  Thank-you for choosing us for your healthcare needs today. 

## 2016-05-06 NOTE — Progress Notes (Signed)
A and Ox 3 Report to RN 

## 2016-05-06 NOTE — Op Note (Signed)
[facility] Patient Name: Jeffery [name] Procedure Date: 05/06/2016 8:15 AM MRN: [id number] Endoscopist: Mauri Pole , MD Age: 75 Referring MD:  Date of Birth: [birth year] Gender: Male Procedure:                Colonoscopy Indications:              Screening for colorectal malignant neoplasm Medicines:                Monitored Anesthesia Care Procedure:                Pre-Anesthesia Assessment:                           - Prior to the procedure, a History and Physical                            was performed, and patient medications and                            allergies were reviewed. The patient's tolerance of                            previous anesthesia was also reviewed. The risks                            and benefits of the procedure and the sedation                            options and risks were discussed with the patient.                            All questions were answered, and informed consent                            was obtained. Prior Anticoagulants: The patient                            last took aspirin on the day of the procedure. ASA                            Grade Assessment: III - A patient with severe                            systemic disease. After reviewing the risks and                            benefits, the patient was deemed in satisfactory                            condition to undergo the procedure.                           After obtaining informed consent, the colonoscope  was passed under direct vision. Throughout the                            procedure, the patient's blood pressure, pulse, and                            oxygen saturations were monitored continuously. The                            Model CF-HQ190L [id number]) scope was introduced                            through the anus and advanced to the the terminal                            ileum, with identification of the  appendiceal                            orifice and IC valve. The colonoscopy was performed                            without difficulty. The patient tolerated the                            procedure well. The quality of the bowel                            preparation was good. The ileocecal valve,                            appendiceal orifice, and rectum were photographed. Scope In: 8:35:33 AM Scope Out: 8:56:13 AM Scope Withdrawal Time: 0 hours 13 minutes 18 seconds  Total Procedure Duration: 0 hours 20 minutes 40 seconds  Findings:                 The perianal and digital rectal examinations were                            normal.                           Three sessile polyps were found in the rectum. The                            polyps were 3 to 6 mm in size. These polyps were                            removed with a cold snare. Resection and retrieval                            were complete.                           Non-bleeding internal hemorrhoids were found during  retroflexion. The hemorrhoids were small.                           A few small-mouthed diverticula were found in the                            sigmoid colon. Complications:            No immediate complications. Estimated Blood Loss:     Estimated blood loss was minimal. Impression:               - Three 3 to 6 mm polyps in the rectum, removed                            with a cold snare. Resected and retrieved.                           - Non-bleeding internal hemorrhoids.                           - Diverticulosis in the sigmoid colon. Recommendation:           - Patient has a contact number available for                            emergencies. The signs and symptoms of potential                            delayed complications were discussed with the                            patient. Return to normal activities tomorrow.                            Written discharge  instructions were provided to the                            patient.                           - Resume previous diet.                           - Continue present medications.                           - Await pathology results.                           - Repeat colonoscopy in 5-10 years for surveillance.                           - Return to GI clinic PRN. Mauri Pole, MD 05/06/2016 9:07:16 AM This report has been signed electronically.

## 2016-05-06 NOTE — Progress Notes (Signed)
Called to room to assist during endoscopic procedure.  Patient ID and intended procedure confirmed with present staff. Received instructions for my participation in the procedure from the performing physician.  

## 2016-05-07 ENCOUNTER — Telehealth: Payer: Self-pay

## 2016-05-07 NOTE — Telephone Encounter (Signed)
  Follow up Call-  Call back number 05/06/2016  Post procedure Call Back phone  # [phone]  Permission to leave phone message Yes     Patient questions:  Do you have a fever, pain , or abdominal swelling? No. Pain Score  0 *  Have you tolerated food without any problems? Yes.    Have you been able to return to your normal activities? Yes.    Do you have any questions about your discharge instructions: Diet   No. Medications  No. Follow up visit  No.  Do you have questions or concerns about your Care? No.  Actions: * If pain score is 4 or above: No action needed, pain <4.

## 2016-05-12 ENCOUNTER — Encounter: Payer: Self-pay | Admitting: Gastroenterology

## 2016-05-21 ENCOUNTER — Other Ambulatory Visit: Payer: Self-pay | Admitting: Interventional Cardiology

## 2016-05-22 ENCOUNTER — Other Ambulatory Visit: Payer: Self-pay | Admitting: Internal Medicine

## 2016-06-29 ENCOUNTER — Other Ambulatory Visit: Payer: Self-pay | Admitting: Internal Medicine

## 2016-06-29 MED ORDER — LORAZEPAM 1 MG PO TABS: 0.5000 mg | ORAL_TABLET | Freq: Every evening | ORAL | Status: DC | PRN

## 2016-06-29 NOTE — Telephone Encounter (Signed)
[facility]ed [facility] 

## 2016-07-16 ENCOUNTER — Other Ambulatory Visit: Payer: Self-pay | Admitting: Internal Medicine

## 2016-07-16 ENCOUNTER — Other Ambulatory Visit: Payer: Self-pay | Admitting: Interventional Cardiology

## 2016-07-16 MED ORDER — HYDROCODONE-ACETAMINOPHEN 7.5-325 MG PO TABS: ORAL_TABLET | ORAL | 0 refills | Status: DC

## 2016-07-16 NOTE — Telephone Encounter (Signed)
Done hardcopy to Corinne  

## 2016-07-16 NOTE — Telephone Encounter (Signed)
MD is out of the office today and all next week pls advise on refill...Johny Chess

## 2016-07-23 ENCOUNTER — Ambulatory Visit (INDEPENDENT_AMBULATORY_CARE_PROVIDER_SITE_OTHER): Payer: Medicare Other | Admitting: Interventional Cardiology

## 2016-07-23 ENCOUNTER — Encounter: Payer: Self-pay | Admitting: Interventional Cardiology

## 2016-07-23 VITALS — BP 124/74 | HR 62 | Ht 69.0 in | Wt 203.4 lb

## 2016-07-23 DIAGNOSIS — I5042 Chronic combined systolic (congestive) and diastolic (congestive) heart failure: Secondary | ICD-10-CM | POA: Diagnosis not present

## 2016-07-23 DIAGNOSIS — I10 Essential (primary) hypertension: Secondary | ICD-10-CM | POA: Diagnosis not present

## 2016-07-23 DIAGNOSIS — I447 Left bundle-branch block, unspecified: Secondary | ICD-10-CM

## 2016-07-23 DIAGNOSIS — I2510 Atherosclerotic heart disease of native coronary artery without angina pectoris: Secondary | ICD-10-CM | POA: Diagnosis not present

## 2016-07-23 DIAGNOSIS — E785 Hyperlipidemia, unspecified: Secondary | ICD-10-CM

## 2016-07-23 MED ORDER — NEBIVOLOL HCL 5 MG PO TABS: 5.0000 mg | ORAL_TABLET | Freq: Every day | ORAL | 3 refills | Status: DC

## 2016-07-23 MED ORDER — LISINOPRIL-HYDROCHLOROTHIAZIDE 20-12.5 MG PO TABS: 1.0000 | ORAL_TABLET | Freq: Every day | ORAL | 3 refills | Status: DC

## 2016-07-23 NOTE — Patient Instructions (Addendum)
Medication Instructions:  Your physician recommends that you continue on your current medications as directed. Please refer to the Current Medication list given to you today.   Labwork: Your physician recommends that you return for a FASTING lipid profile and lft 07/26/16   Testing/Procedures: None ordered  Follow-Up: Your physician wants you to follow-up in: 1 year with Dr.Smith You will receive a reminder letter in the mail two months in advance. If you don't receive a letter, please call our office to schedule the follow-up appointment.   Any Other Special Instructions Will Be Listed Below (If Applicable).     If you need a refill on your cardiac medications before your next appointment, please call your pharmacy.

## 2016-07-23 NOTE — Progress Notes (Signed)
Cardiology Office Note    Date:  07/23/2016   ID:  Jeffery [name], DOB September 23, 1941, MRN [id number]  PCP:  [name], MD  Cardiologist: Sinclair Grooms, MD   Chief Complaint  Patient presents with  . Shortness of Breath  . Follow-up    Hypertension    History of Present Illness:  Jeffery [name] is a 75 y.o. male who is being seen to follow-up left bundle branch block, hypertension, hyperlipidemia, and encouragement concerning management of obstructive sleep apnea, diabetes mellitus and obesity.  Still relatively active. Planning a trip to [place] this fall with his 2 grandchildren. They are now legally responsible for raising the grandchildren. He still does heavy activity working initial heart lifting and carrying his crank his etc. No chest discomfort or excessive dyspnea. He states that he gets cramping in the hands and toes after he has been very active. He may go on 3-6 hours intermittently after heavy physical activity. No palpitations. Denies syncope.  Past Medical History:  Diagnosis Date  . Allergy   . Arthritis   . Asthma   . Blood transfusion without reported diagnosis   . CAD (coronary artery disease)   . Diabetes mellitus without complication ([place])   . GERD (gastroesophageal reflux disease)   . HTN (hypertension)   . Hyperlipidemia   . LBBB (left bundle branch block) 1999  . Seasonal allergies   . Sleep apnea     Past Surgical History:  Procedure Laterality Date  . CARDIAC CATHETERIZATION  2003   Dr Pernell Dupre; 85 % R circumflex obstruction  . CARPAL TUNNEL RELEASE Left 10/11/2014   Procedure: LEFT CARPAL TUNNEL RELEASE;  Surgeon: Roseanne Kaufman, MD;  Location: [facility];  Service: Orthopedics;  Laterality: Left;  . INGUINAL HERNIA REPAIR     right, age 89  . INGUINAL HERNIA REPAIR     left age 60  . LUMBAR LAMINECTOMY     age 21  . MINOR CARPAL TUNNEL Right 11/22/2014   Procedure: RIGHT LIMITED OPEN CARPAL TUNNEL  RELEASE;  Surgeon: Roseanne Kaufman, MD;  Location: [facility];  Service: Orthopedics;  Laterality: Right;  . TONSILLECTOMY     age 64    Current Medications: Outpatient Medications Prior to Visit  Medication Sig Dispense Refill  . ACCU-CHEK SOFTCLIX LANCETS lancets Use to test blood sugar daily as directed. 100 each 3  . albuterol (PROAIR HFA) 108 (90 BASE) MCG/ACT inhaler Inhale 2 puffs into the lungs as needed.      [name] aspirin 81 MG tablet Take 81 mg by mouth at bedtime.      . Blood Glucose Calibration (ACCU-CHEK AVIVA) SOLN Use as instructed to test blood sugar ICD 10 E11 51 1 each 3  . Blood Glucose Monitoring Suppl (ACCU-CHEK AVIVA PLUS) W/DEVICE KIT Use as directed 1 kit 0  . Coenzyme Q10 (COQ10) 100 MG CAPS Take 200 mg by mouth daily.      . Colesevelam HCl 3.75 G PACK Take 3.75 g by mouth daily. Reported on 04/27/2016    . esomeprazole (NEXIUM) 20 MG capsule Take 20 mg by mouth daily at 12 noon.    . fexofenadine (ALLEGRA) 180 MG tablet Take 180 mg by mouth daily.    [name] glucose blood (ACCU-CHEK AVIVA PLUS) test strip Use as instructed to test blood sugar daily ICD 10 E11 51 100 each 3  . HYDROcodone-acetaminophen (NORCO) 7.5-325 MG tablet 1 q 6 hrs prn only 30  tablet 0  . ibuprofen (ADVIL,MOTRIN) 800 MG tablet Take 800 mg by mouth every 8 (eight) hours as needed.    Elmore Guise Devices (AUTOLET IMPRESSION) MISC Use as instructed to test blood sugar ICD 10 E11 51 1 each 3  . levalbuterol (XOPENEX) 1.25 MG/3ML nebulizer solution Take 1 ampule by nebulization as needed. Use in nebulizer prn     . LORazepam (ATIVAN) 1 MG tablet Take 0.5-1 tablets (0.5-1 mg total) by mouth at bedtime as needed. for sleep 30 tablet 1  . metFORMIN (GLUCOPHAGE) 500 MG tablet Take 1 tablet by mouth  daily 90 tablet 3  . montelukast (SINGULAIR) 10 MG tablet Take 1 tablet by mouth  daily 90 tablet 2  . ONE TOUCH LANCETS MISC by Does not apply route as directed. Check blood sugar 6 time per day     .  OVER THE COUNTER MEDICATION Take 1 capsule by mouth daily. Tumeric    . rosuvastatin (CRESTOR) 10 MG tablet Take 10 mg by mouth once a week. Patient takes one tab by mouth on Wednesdays    . SYMBICORT 160-4.5 MCG/ACT inhaler INHALE 1 TO 2 PUFFS INTO  THE LUNGS EVERY 12 HOURS AS NEEDED. 30.6 g 1  . vardenafil (LEVITRA) 20 MG tablet TAKE 1 TABLET (20 MG TOTAL) BY MOUTH AS DIRECTED. CAN NOT BE TAKENWITH NITROGLYCERIN 6 tablet 0  . BYSTOLIC 5 MG tablet Take 1 tablet by mouth once daily 90 tablet 0  . lisinopril-hydrochlorothiazide (PRINZIDE,ZESTORETIC) 20-12.5 MG tablet Take 1 tablet by mouth daily. 90 tablet 0   No facility-administered medications prior to visit.      Allergies:   Contrast media [iodinated diagnostic agents]   Social History   Social History  . Marital status: Married    Spouse name: N/A  . Number of children: N/A  . Years of education: N/A   Social History Main Topics  . Smoking status: Former Smoker    Quit date: 12/14/1963  . Smokeless tobacco: Never Used  . Alcohol use 1.2 oz/week    2 Glasses of wine per week     Comment: socially  . Drug use: No  . Sexual activity: Yes   Other Topics Concern  . None   Social History Narrative  . None     Family History:  The patient's family history includes Colon polyps in his father; Diabetes in his maternal aunt; Heart attack in his father; Heart failure in his mother; Hypertension in his mother; Subarachnoid hemorrhage (age of onset: 47) in his mother; Subarachnoid hemorrhage (age of onset: 81) in his paternal grandfather.   ROS:   Please see the history of present illness.    Recurring back pain, easy bruising, feels his overall condition isn't steady decline. Well.  All other systems reviewed and are negative.   PHYSICAL EXAM:   VS:  BP 124/74   Pulse 62   Ht 5' 9"  (1.753 m)   Wt 203 lb 6.4 oz (92.3 kg)   SpO2 95%   BMI 30.04 kg/m    GEN: Well nourished, well developed, in no acute distress  HEENT: normal   Neck: no JVD, carotid bruits, or masses Cardiac: RRR; no murmurs, rubs, or gallops,no edema  Respiratory:  clear to auscultation bilaterally, normal work of breathing GI: soft, nontender, nondistended, + BS MS: no deformity or atrophy  Skin: warm and dry, no rash Neuro:  Alert and Oriented x 3, Strength and sensation are intact Psych: euthymic mood, full affect  Wt  Readings from Last 3 Encounters:  07/23/16 203 lb 6.4 oz (92.3 kg)  05/06/16 203 lb (92.1 kg)  04/27/16 203 lb 3.2 oz (92.2 kg)      Studies/Labs Reviewed:   EKG:  EKG  Normal sinus rhythm, left bundle branch block, and unchanged when compared to prior imaging.  Recent Labs: 03/02/2016: BUN 18; Creatinine, Ser 1.21; Potassium 4.2; Sodium 139   Lipid Panel    Component Value Date/Time   CHOL 143 08/20/2014 0750   TRIG 145 08/20/2014 0750   HDL 33 (L) 08/20/2014 0750   CHOLHDL 4 07/07/2009 0857   VLDL 14.8 07/07/2009 0857   LDLCALC 81 08/20/2014 0750    Additional studies/ records that were reviewed today include:  ECHOCARDIOGRAM 01/27/15: Study Conclusions  - Left ventricle: The cavity size was normal. Wall thickness was normal. Systolic function was mildly to moderately reduced. The estimated ejection fraction was in the range of 40% to 45%. Although no diagnostic regional wall motion abnormality was identified, this possibility cannot be completely excluded on the basis of this study. Features are consistent with a pseudonormal left ventricular filling pattern, with concomitant abnormal relaxation and increased filling pressure (grade 2 diastolic dysfunction). - Ventricular septum: Septal motion showed abnormal function, dyssynergy, and paradox. These changes are consistent with a left bundle branch block. - Left atrium: The atrium was mildly dilated.   ASSESSMENT:    1. Atherosclerosis of native coronary artery of native heart without angina pectoris   2. Left bundle branch  block   3. Essential hypertension   4. Chronic combined systolic and diastolic CHF (congestive heart failure) ([place])   5. Hyperlipidemia      PLAN:  In order of problems listed above:  1. Asymptomatic. Primary objective is primary prevention of acute ischemic events with lipid lowering, blood pressure control, and weight control. 2. No change compared to prior. QRS duration is 150 ms. 3. Well controlled. Low salt diet and weight loss were discussed. 4. Not yet a candidate for resynchronization therapy as he is asymptomatic and EF is still greater than 60% at the time of last echo. Please see above. 5. Fasting liver and lipid will be obtained.    Medication Adjustments/Labs and Tests Ordered: Current medicines are reviewed at length with the patient today.  Concerns regarding medicines are outlined above.  Medication changes, Labs and Tests ordered today are listed in the Patient Instructions below. Patient Instructions  Medication Instructions:  Your physician recommends that you continue on your current medications as directed. Please refer to the Current Medication list given to you today.   Labwork: Your physician recommends that you return for a FASTING lipid profile and lft 07/26/16   Testing/Procedures: None ordered  Follow-Up: Your physician wants you to follow-up in: 1 year with Dr.Judah Chevere You will receive a reminder letter in the mail two months in advance. If you don't receive a letter, please call our office to schedule the follow-up appointment.   Any Other Special Instructions Will Be Listed Below (If Applicable).     If you need a refill on your cardiac medications before your next appointment, please call your pharmacy.      Signed, Sinclair Grooms, MD  07/23/2016 11:12 AM    [facility] [address], [place], [place]  [address] Phone: [phone]; Fax: [phone]

## 2016-07-26 ENCOUNTER — Telehealth: Payer: Self-pay | Admitting: *Deleted

## 2016-07-26 ENCOUNTER — Other Ambulatory Visit: Payer: Medicare Other | Admitting: *Deleted

## 2016-07-26 DIAGNOSIS — E785 Hyperlipidemia, unspecified: Secondary | ICD-10-CM

## 2016-07-26 LAB — LIPID PANEL
CHOL/HDL RATIO: 5.5 ratio — AB (ref <=5.0)
Cholesterol: 177 mg/dL (ref 125–200)
HDL: 32 mg/dL — AB (ref >=40)
LDL CALC: 111 mg/dL (ref <130)
Triglycerides: 168 mg/dL — ABNORMAL HIGH (ref <150)
VLDL: 34 mg/dL — ABNORMAL HIGH (ref <30)

## 2016-07-26 LAB — HEPATIC FUNCTION PANEL
ALBUMIN: 3.7 g/dL (ref 3.6–5.1)
ALT: 23 U/L (ref 9–46)
AST: 18 U/L (ref 10–35)
Alkaline Phosphatase: 70 U/L (ref 40–115)
BILIRUBIN TOTAL: 0.4 mg/dL (ref 0.2–1.2)
Bilirubin, Direct: 0.1 mg/dL (ref <=0.2)
Indirect Bilirubin: 0.3 mg/dL (ref 0.2–1.2)
TOTAL PROTEIN: 6.1 g/dL (ref 6.1–8.1)

## 2016-07-26 NOTE — Telephone Encounter (Signed)
Pt wants Nitro Sub, said it was suppose to be ordered & sent in at [facility], I dont see this, can you please see if we can do this? Thanks

## 2016-07-26 NOTE — Telephone Encounter (Signed)
I don't see Nitro-glycerin listed on patients current or past medication list. Will route to Dr.Smith to advise. Noted that Levitra is listed on patients med list.

## 2016-07-27 NOTE — Telephone Encounter (Signed)
He has had it in the past. It is okay to prescribed. Bottle with 25 tablets. He can refill for up to a year.

## 2016-07-28 ENCOUNTER — Other Ambulatory Visit: Payer: Self-pay

## 2016-07-28 DIAGNOSIS — E785 Hyperlipidemia, unspecified: Secondary | ICD-10-CM

## 2016-07-28 MED ORDER — NITROGLYCERIN 0.4 MG SL SUBL: 0.4000 mg | SUBLINGUAL_TABLET | SUBLINGUAL | 3 refills | Status: DC | PRN

## 2016-07-28 NOTE — Telephone Encounter (Signed)
Rx sent to pt pharmacy 

## 2016-10-12 ENCOUNTER — Other Ambulatory Visit: Payer: Self-pay | Admitting: Internal Medicine

## 2016-10-12 NOTE — Telephone Encounter (Signed)
[facility]ed [facility] 

## 2016-10-24 ENCOUNTER — Other Ambulatory Visit: Payer: Self-pay | Admitting: Internal Medicine

## 2016-10-25 MED ORDER — HYDROCODONE-ACETAMINOPHEN 7.5-325 MG PO TABS: ORAL_TABLET | ORAL | 0 refills | Status: DC

## 2016-10-25 NOTE — Telephone Encounter (Signed)
Printed script... MD signed... Notified pt rx ready for pick-up...Jeffery [name]

## 2016-10-25 NOTE — Telephone Encounter (Signed)
[place] controlled substance database checked.  Ok to fill medication.  

## 2016-12-13 ENCOUNTER — Other Ambulatory Visit: Payer: Self-pay | Admitting: Internal Medicine

## 2016-12-13 HISTORY — PX: COLONOSCOPY W/ BIOPSIES AND POLYPECTOMY: SHX1376

## 2016-12-30 ENCOUNTER — Other Ambulatory Visit: Payer: Self-pay | Admitting: Internal Medicine

## 2016-12-31 NOTE — Telephone Encounter (Signed)
RX faxed to [facility] 

## 2017-01-04 ENCOUNTER — Other Ambulatory Visit: Payer: Self-pay | Admitting: Internal Medicine

## 2017-01-05 ENCOUNTER — Other Ambulatory Visit: Payer: Self-pay | Admitting: Internal Medicine

## 2017-01-06 MED ORDER — HYDROCODONE-ACETAMINOPHEN 7.5-325 MG PO TABS: ORAL_TABLET | ORAL | 0 refills | Status: DC

## 2017-01-06 NOTE — Telephone Encounter (Signed)
[place] controlled substance database checked.  Ok to fill medication. rx printed 

## 2017-01-13 ENCOUNTER — Encounter: Payer: Self-pay | Admitting: Adult Health

## 2017-01-13 ENCOUNTER — Ambulatory Visit (INDEPENDENT_AMBULATORY_CARE_PROVIDER_SITE_OTHER): Payer: Medicare Other | Admitting: Adult Health

## 2017-01-13 VITALS — BP 110/62 | Temp 97.7°F | Ht 69.0 in | Wt 188.2 lb

## 2017-01-13 DIAGNOSIS — J40 Bronchitis, not specified as acute or chronic: Secondary | ICD-10-CM | POA: Diagnosis not present

## 2017-01-13 MED ORDER — PREDNISONE 10 MG PO TABS: ORAL_TABLET | ORAL | 0 refills | Status: DC

## 2017-01-13 MED ORDER — DOXYCYCLINE HYCLATE 100 MG PO CAPS: 100.0000 mg | ORAL_CAPSULE | Freq: Two times a day (BID) | ORAL | 0 refills | Status: DC

## 2017-01-13 MED ORDER — IPRATROPIUM-ALBUTEROL 0.5-2.5 (3) MG/3ML IN SOLN: 3.0000 mL | Freq: Once | RESPIRATORY_TRACT | Status: DC

## 2017-01-13 MED ORDER — HYDROCODONE-HOMATROPINE 5-1.5 MG/5ML PO SYRP: 5.0000 mL | ORAL_SOLUTION | Freq: Three times a day (TID) | ORAL | 0 refills | Status: DC | PRN

## 2017-01-13 NOTE — Progress Notes (Signed)
Subjective:    Patient ID: Jeffery [name], male    DOB: October 12, 1941, 76 y.o.   MRN: [id number]  Was seen by [facility] and was prescribed 5 day course of prednisone, which helped with his cough    URI   This is a recurrent problem. The current episode started 1 to 4 weeks ago. There has been no fever. Associated symptoms include congestion, coughing (productive), headaches and wheezing. Pertinent negatives include no rhinorrhea, sinus pain or sore throat. Treatments tried: Prednisone and Mucinex. The treatment provided mild relief.    Denies any sinusitis type symptoms.   Review of Systems  Constitutional: Negative for activity change, chills, fatigue and fever.  HENT: Positive for congestion. Negative for rhinorrhea, sinus pain, sinus pressure and sore throat.   Respiratory: Positive for cough (productive), shortness of breath and wheezing.   Neurological: Positive for headaches.   Past Medical History:  Diagnosis Date  . Allergy   . Arthritis   . Asthma   . Blood transfusion without reported diagnosis   . CAD (coronary artery disease)   . Diabetes mellitus without complication ([place])   . GERD (gastroesophageal reflux disease)   . HTN (hypertension)   . Hyperlipidemia   . LBBB (left bundle branch block) 1999  . Seasonal allergies   . Sleep apnea     Social History   Social History  . Marital status: Married    Spouse name: N/A  . Number of children: N/A  . Years of education: N/A   Occupational History  . Not on file.   Social History Main Topics  . Smoking status: Former Smoker    Quit date: 12/14/1963  . Smokeless tobacco: Never Used  . Alcohol use 1.2 oz/week    2 Glasses of wine per week     Comment: socially  . Drug use: No  . Sexual activity: Yes   Other Topics Concern  . Not on file   Social History Narrative  . No narrative on file    Past Surgical History:  Procedure Laterality Date  . CARDIAC CATHETERIZATION  2003   Dr Pernell Dupre; 85 %  R circumflex obstruction  . CARPAL TUNNEL RELEASE Left 10/11/2014   Procedure: LEFT CARPAL TUNNEL RELEASE;  Surgeon: Roseanne Kaufman, MD;  Location: [facility];  Service: Orthopedics;  Laterality: Left;  . INGUINAL HERNIA REPAIR     right, age 59  . INGUINAL HERNIA REPAIR     left age 72  . LUMBAR LAMINECTOMY     age 56  . MINOR CARPAL TUNNEL Right 11/22/2014   Procedure: RIGHT LIMITED OPEN CARPAL TUNNEL RELEASE;  Surgeon: Roseanne Kaufman, MD;  Location: [facility];  Service: Orthopedics;  Laterality: Right;  . TONSILLECTOMY     age 85    Family History  Problem Relation Age of Onset  . Heart attack Father     86s  . Colon polyps Father   . Heart failure Mother     90s  . Subarachnoid hemorrhage Mother 70  . Hypertension Mother   . Subarachnoid hemorrhage Paternal Grandfather 46  . Diabetes Maternal Aunt   . Colon cancer Neg Hx     Allergies  Allergen Reactions  . Contrast Media [Iodinated Diagnostic Agents]     Redness and warm sensation     Current Outpatient Prescriptions on File Prior to Visit  Medication Sig Dispense Refill  . ACCU-CHEK SOFTCLIX LANCETS lancets Use to test blood sugar daily as directed.  100 each 3  . albuterol (PROAIR HFA) 108 (90 BASE) MCG/ACT inhaler Inhale 2 puffs into the lungs as needed.      [name] aspirin 81 MG tablet Take 81 mg by mouth at bedtime.      . Blood Glucose Calibration (ACCU-CHEK AVIVA) SOLN Use as instructed to test blood sugar ICD 10 E11 51 1 each 3  . Blood Glucose Monitoring Suppl (ACCU-CHEK AVIVA PLUS) W/DEVICE KIT Use as directed 1 kit 0  . budesonide-formoterol (SYMBICORT) 160-4.5 MCG/ACT inhaler INHALE 1 TO 2 PUFFS INTO  THE LUNGS EVERY 12 HOURS AS NEEDED.  --- Office visit needed for further refills 30.6 g 0  . Coenzyme Q10 (COQ10) 100 MG CAPS Take 200 mg by mouth daily.      . Colesevelam HCl 3.75 G PACK Take 3.75 g by mouth daily. Reported on 04/27/2016    . esomeprazole (NEXIUM) 20 MG capsule Take  20 mg by mouth daily at 12 noon.    . fexofenadine (ALLEGRA) 180 MG tablet Take 180 mg by mouth daily.    [name] glucose blood (ACCU-CHEK AVIVA PLUS) test strip Use as instructed to test blood sugar daily ICD 10 E11 51 100 each 3  . HYDROcodone-acetaminophen (NORCO) 7.5-325 MG tablet 1 q 6 hrs prn only 30 tablet 0  . ibuprofen (ADVIL,MOTRIN) 800 MG tablet Take 800 mg by mouth every 8 (eight) hours as needed.    Elmore Guise Devices (AUTOLET IMPRESSION) MISC Use as instructed to test blood sugar ICD 10 E11 51 1 each 3  . levalbuterol (XOPENEX) 1.25 MG/3ML nebulizer solution Take 1 ampule by nebulization as needed. Use in nebulizer prn     . lisinopril-hydrochlorothiazide (PRINZIDE,ZESTORETIC) 20-12.5 MG tablet Take 1 tablet by mouth daily. 90 tablet 3  . LORazepam (ATIVAN) 1 MG tablet Take 1/2 to 1 tablet by mouth every day at bedtime as needed. --- Office visit needed in March for further refills. 30 tablet 1  . metFORMIN (GLUCOPHAGE) 500 MG tablet Take 1 tablet by mouth  daily 90 tablet 3  . montelukast (SINGULAIR) 10 MG tablet Take 1 tablet (10 mg total) by mouth daily. --- Office visit needed for further refills. 90 tablet 0  . nebivolol (BYSTOLIC) 5 MG tablet Take 1 tablet (5 mg total) by mouth daily. 90 tablet 3  . nitroGLYCERIN (NITROSTAT) 0.4 MG SL tablet Place 1 tablet (0.4 mg total) under the tongue every 5 (five) minutes as needed. 25 tablet 3  . ONE TOUCH LANCETS MISC by Does not apply route as directed. Check blood sugar 6 time per day     . OVER THE COUNTER MEDICATION Take 1 capsule by mouth daily. Tumeric    . rosuvastatin (CRESTOR) 10 MG tablet Take 10 mg by mouth once a week. Patient takes one tab by mouth on Wednesdays    . vardenafil (LEVITRA) 20 MG tablet TAKE 1 TABLET (20 MG TOTAL) BY MOUTH AS DIRECTED. CAN NOT BE TAKENWITH NITROGLYCERIN 6 tablet 0   No current facility-administered medications on file prior to visit.     BP 110/62   Temp 97.7 F (36.5 C) (Oral)   Ht 5' 9"  (1.753  m)   Wt 188 lb 3.2 oz (85.4 kg)   BMI 27.79 kg/m       Objective:   Physical Exam  Constitutional: He appears well-developed and well-nourished. No distress.  Cardiovascular: Normal rate, regular rhythm, normal heart sounds and intact distal pulses.  Exam reveals no gallop and no friction rub.  No murmur heard. Pulmonary/Chest: Effort normal. No respiratory distress. He has wheezes in the right upper field, the right middle field, the right lower field, the left upper field, the left middle field and the left lower field. He has rhonchi in the right upper field, the right middle field and the left middle field. He has no rales. He exhibits no tenderness.  Nursing note and vitals reviewed.     Assessment & Plan:  1. Bronchitis - ipratropium-albuterol (DUONEB) 0.5-2.5 (3) MG/3ML nebulizer solution 3 mL; Take 3 mLs by nebulization once. - predniSONE (DELTASONE) 10 MG tablet; 40 mg x 3 days, 20 mg x 3 days, 10 mg x 3 days  Dispense: 21 tablet; Refill: 0 - doxycycline (VIBRAMYCIN) 100 MG capsule; Take 1 capsule (100 mg total) by mouth 2 (two) times daily.  Dispense: 14 capsule; Refill: 0 - HYDROcodone-homatropine (HYCODAN) 5-1.5 MG/5ML syrup; Take 5 mLs by mouth every 8 (eight) hours as needed for cough.  Dispense: 120 mL; Refill: 0 - endorsed feeling as though he could breath easier after duo neb. Wheezing and rhonchi has diminished  Dorothyann Peng, NP

## 2017-01-24 ENCOUNTER — Encounter: Payer: Self-pay | Admitting: Internal Medicine

## 2017-01-24 DIAGNOSIS — E1159 Type 2 diabetes mellitus with other circulatory complications: Secondary | ICD-10-CM

## 2017-01-24 DIAGNOSIS — Z1159 Encounter for screening for other viral diseases: Secondary | ICD-10-CM

## 2017-03-09 ENCOUNTER — Other Ambulatory Visit: Payer: Self-pay | Admitting: Internal Medicine

## 2017-03-09 MED ORDER — VARDENAFIL HCL 20 MG PO TABS: ORAL_TABLET | ORAL | 0 refills | Status: DC

## 2017-04-04 ENCOUNTER — Ambulatory Visit (INDEPENDENT_AMBULATORY_CARE_PROVIDER_SITE_OTHER): Payer: Medicare Other

## 2017-04-04 ENCOUNTER — Ambulatory Visit (INDEPENDENT_AMBULATORY_CARE_PROVIDER_SITE_OTHER): Payer: Medicare Other | Admitting: Podiatry

## 2017-04-04 ENCOUNTER — Encounter: Payer: Self-pay | Admitting: Podiatry

## 2017-04-04 VITALS — BP 146/80 | HR 68 | Resp 16 | Ht 69.5 in | Wt 177.0 lb

## 2017-04-04 DIAGNOSIS — B079 Viral wart, unspecified: Secondary | ICD-10-CM

## 2017-04-04 DIAGNOSIS — L84 Corns and callosities: Secondary | ICD-10-CM

## 2017-04-04 DIAGNOSIS — M79675 Pain in left toe(s): Secondary | ICD-10-CM

## 2017-04-04 NOTE — Progress Notes (Signed)
   Subjective:    Patient ID: Jeffery [name], male    DOB: January 11, 1941, 76 y.o.   MRN: [id number]  HPI Chief Complaint  Patient presents with  . Painful lesion    Left foot; 3rd toe-base of toe; pt stated, "Can't walk long distance; certain shoes hurt"      Review of Systems  All other systems reviewed and are negative.      Objective:   Physical Exam        Assessment & Plan:

## 2017-04-04 NOTE — Progress Notes (Signed)
Subjective:    Patient ID: Jeffery [name], male   DOB: 76 y.o.   MRN: [id number]   HPI patient presents stating he has a lot of pain at the base of his third toe left that makes it hard to walk comfortably and it's been going on for a while    Review of Systems  All other systems reviewed and are negative.       Objective:  Physical Exam  Cardiovascular: Intact distal pulses.   Musculoskeletal: Normal range of motion.  Neurological: He is alert.  Skin: Skin is warm.  Nursing note and vitals reviewed.  neurovascular status is found to be intact muscle strength was adequate range of motion within normal limits with patient having type 2 diabetes and good control and is found to have a very painful lesion at the plantar aspect of the left third toe that is thickened and keratotic and painful when palpated     Assessment:    Probability for lesion which may be porokeratotic callus or possible verruca plantaris     Plan:    H&P condition reviewed and I recommended that we do deep debridement of the lesion which was accomplished and I found pinpoint bleeding and I went ahead and I applied a immune agent to try to kill probable wart type viral cells. Patient tolerated this well sterile dressing was applied and was instructed what to do if any pathology should occur

## 2017-04-06 ENCOUNTER — Other Ambulatory Visit: Payer: Self-pay | Admitting: Internal Medicine

## 2017-04-06 ENCOUNTER — Encounter: Payer: Self-pay | Admitting: Emergency Medicine

## 2017-04-06 MED ORDER — HYDROCODONE-ACETAMINOPHEN 7.5-325 MG PO TABS: ORAL_TABLET | ORAL | 0 refills | Status: DC

## 2017-04-06 NOTE — Telephone Encounter (Signed)
rx refilled - can only give 5 days worth due to new [place] laws

## 2017-04-10 NOTE — Progress Notes (Addendum)
Subjective:    Patient ID: Jeffery [name], male    DOB: [birth year], 76 y.o.   MRN: [id number]  HPI He is here for follow up.  He is vegan and eating very healthy.  He has lost weight.  He is walking a lot and very active, he is doing 8000-12000 steps a day.  He fell December - injured left shoulder.   They take care of grandchildren (2) daily.    Chronic arthritis pain:  He has been taking hydrocodone as needed for years.  He takes Hydrocodone in 1-2 day bursts when needed.  He does not take it daily. The medication works as well as 15 years ago.  He has arthritis pain in multiple joints and MSK pain.  He typically takes it after certain activities.   He usually takes a 1/2 a pill when he takes it.  He often uses 30 pills in about 6 weeks.    GERD;  He stopped the nexium.  He has occasional GERD/cough.  He would like to avoid the medication if possible.  Losing weight helped.       Medications and allergies reviewed with patient and updated if appropriate.  Patient Active Problem List   Diagnosis Date Noted  . Chronic combined systolic and diastolic CHF (congestive heart failure) ([place]) 01/24/2015  . OSA (obstructive sleep apnea) 10/03/2014  . CTS (carpal tunnel syndrome) 09/27/2014  . Left bundle branch block 11/27/2013  . Essential hypertension 11/27/2013  . GERD (gastroesophageal reflux disease) 02/27/2013  . Type 2 diabetes mellitus with vascular disease ([place]) 04/09/2009  . Mixed hyperlipidemia 09/06/2008  . Coronary atherosclerosis 09/06/2008  . Asthma 09/06/2008    Current Outpatient Prescriptions on File Prior to Visit  Medication Sig Dispense Refill  . ACCU-CHEK SOFTCLIX LANCETS lancets Use to test blood sugar daily as directed. 100 each 3  . albuterol (PROAIR HFA) 108 (90 BASE) MCG/ACT inhaler Inhale 2 puffs into the lungs as needed.      [name] aspirin 81 MG tablet Take 81 mg by mouth at bedtime.      . Blood Glucose Calibration (ACCU-CHEK AVIVA) SOLN Use as  instructed to test blood sugar ICD 10 E11 51 1 each 3  . Blood Glucose Monitoring Suppl (ACCU-CHEK AVIVA PLUS) W/DEVICE KIT Use as directed 1 kit 0  . budesonide-formoterol (SYMBICORT) 160-4.5 MCG/ACT inhaler INHALE 1 TO 2 PUFFS INTO  THE LUNGS EVERY 12 HOURS AS NEEDED.  --- Office visit needed for further refills 30.6 g 0  . Coenzyme Q10 (COQ10) 100 MG CAPS Take 200 mg by mouth daily.      . Colesevelam HCl 3.75 G PACK Take 3.75 g by mouth daily. Reported on 04/27/2016    . esomeprazole (NEXIUM) 20 MG capsule Take 20 mg by mouth daily at 12 noon.    . fexofenadine (ALLEGRA) 180 MG tablet Take 180 mg by mouth daily.    [name] glucose blood (ACCU-CHEK AVIVA PLUS) test strip Use as instructed to test blood sugar daily ICD 10 E11 51 100 each 3  . HYDROcodone-acetaminophen (NORCO) 7.5-325 MG tablet 1 q 6 hrs prn only 20 tablet 0  . ibuprofen (ADVIL,MOTRIN) 800 MG tablet Take 800 mg by mouth every 8 (eight) hours as needed.    Elmore Guise Devices (AUTOLET IMPRESSION) MISC Use as instructed to test blood sugar ICD 10 E11 51 1 each 3  . levalbuterol (XOPENEX) 1.25 MG/3ML nebulizer solution Take 1 ampule by nebulization as needed. Use in nebulizer prn     .  lisinopril-hydrochlorothiazide (PRINZIDE,ZESTORETIC) 20-12.5 MG tablet Take 1 tablet by mouth daily. (Patient taking differently: Take 0.5 tablets by mouth daily. ) 90 tablet 3  . LORazepam (ATIVAN) 1 MG tablet Take 1/2 to 1 tablet by mouth every day at bedtime as needed. --- Office visit needed in March for further refills. 30 tablet 1  . montelukast (SINGULAIR) 10 MG tablet Take 1 tablet (10 mg total) by mouth daily. -- Office visit needed for further refills 90 tablet 0  . nebivolol (BYSTOLIC) 5 MG tablet Take 1 tablet (5 mg total) by mouth daily. 90 tablet 3  . nitroGLYCERIN (NITROSTAT) 0.4 MG SL tablet Place 1 tablet (0.4 mg total) under the tongue every 5 (five) minutes as needed. 25 tablet 3  . ONE TOUCH LANCETS MISC by Does not apply route as directed.  Check blood sugar 6 time per day     . OVER THE COUNTER MEDICATION Take 1 capsule by mouth daily. Tumeric    . rosuvastatin (CRESTOR) 10 MG tablet Take 10 mg by mouth once a week. Patient takes one tab by mouth on Wednesdays    . vardenafil (LEVITRA) 20 MG tablet TAKE 1 TABLET (20 MG TOTAL) BY MOUTH AS DIRECTED. CAN NOT BE TAKENWITH NITROGLYCERIN. Need OV for future refills 6 tablet 0   No current facility-administered medications on file prior to visit.     Past Medical History:  Diagnosis Date  . Allergy   . Arthritis   . Asthma   . Blood transfusion without reported diagnosis   . CAD (coronary artery disease)   . Diabetes mellitus without complication ([place])   . GERD (gastroesophageal reflux disease)   . HTN (hypertension)   . Hyperlipidemia   . LBBB (left bundle branch block) 1999  . Seasonal allergies   . Sleep apnea     Past Surgical History:  Procedure Laterality Date  . CARDIAC CATHETERIZATION  2003   Dr Pernell Dupre; 85 % R circumflex obstruction  . CARPAL TUNNEL RELEASE Left 10/11/2014   Procedure: LEFT CARPAL TUNNEL RELEASE;  Surgeon: Roseanne Kaufman, MD;  Location: [facility];  Service: Orthopedics;  Laterality: Left;  . INGUINAL HERNIA REPAIR     right, age 11  . INGUINAL HERNIA REPAIR     left age 81  . LUMBAR LAMINECTOMY     age 64  . MINOR CARPAL TUNNEL Right 11/22/2014   Procedure: RIGHT LIMITED OPEN CARPAL TUNNEL RELEASE;  Surgeon: Roseanne Kaufman, MD;  Location: [facility];  Service: Orthopedics;  Laterality: Right;  . TONSILLECTOMY     age 82    Social History   Social History  . Marital status: Married    Spouse name: N/A  . Number of children: N/A  . Years of education: N/A   Social History Main Topics  . Smoking status: Former Smoker    Quit date: 12/14/1963  . Smokeless tobacco: Never Used  . Alcohol use 1.2 oz/week    2 Glasses of wine per week     Comment: socially  . Drug use: No  . Sexual activity: Yes    Other Topics Concern  . None   Social History Narrative  . None    Family History  Problem Relation Age of Onset  . Heart attack Father     89s  . Colon polyps Father   . Heart failure Mother     90s  . Subarachnoid hemorrhage Mother 63  . Hypertension Mother   . Subarachnoid hemorrhage  Paternal Grandfather 31  . Diabetes Maternal Aunt   . Colon cancer Neg Hx     Review of Systems  Constitutional: Negative for fever.  Respiratory: Positive for cough (asthma related - intermittent) and wheezing (asthma related - intermittent). Negative for shortness of breath.   Cardiovascular: Negative for chest pain, palpitations and leg swelling.  Neurological: Negative for light-headedness and headaches.       Objective:   Vitals:   04/12/17 0937  BP: 130/72  Pulse: 61  Resp: 16  Temp: 98.1 F (36.7 C)   Filed Weights   04/12/17 0937  Weight: 180 lb (81.6 kg)   Body mass index is 25.83 kg/m.  Wt Readings from Last 3 Encounters:  04/12/17 180 lb (81.6 kg)  04/04/17 177 lb (80.3 kg)  01/13/17 188 lb 3.2 oz (85.4 kg)     Physical Exam Constitutional: Appears well-developed and well-nourished. No distress.  HENT:  Head: Normocephalic and atraumatic.  Neck: Neck supple. No tracheal deviation present. No thyromegaly present.  No cervical lymphadenopathy Cardiovascular: Normal rate, regular rhythm and normal heart sounds.   No murmur heard. No carotid bruit .  No edema Pulmonary/Chest: Effort normal and breath sounds normal. No respiratory distress. No has no wheezes. No rales.  Skin: Skin is warm and dry. Not diaphoretic.  Psychiatric: Normal mood and affect. Behavior is normal.            Assessment & Plan:   See Problem List for Assessment and Plan of chronic medical problems.

## 2017-04-11 ENCOUNTER — Other Ambulatory Visit (INDEPENDENT_AMBULATORY_CARE_PROVIDER_SITE_OTHER): Payer: Medicare Other

## 2017-04-11 DIAGNOSIS — E1159 Type 2 diabetes mellitus with other circulatory complications: Secondary | ICD-10-CM | POA: Diagnosis not present

## 2017-04-11 DIAGNOSIS — Z1159 Encounter for screening for other viral diseases: Secondary | ICD-10-CM

## 2017-04-11 LAB — HEMOGLOBIN A1C: Hgb A1c MFr Bld: 6.3 % (ref 4.6–6.5)

## 2017-04-12 ENCOUNTER — Ambulatory Visit (INDEPENDENT_AMBULATORY_CARE_PROVIDER_SITE_OTHER): Payer: Medicare Other | Admitting: Internal Medicine

## 2017-04-12 ENCOUNTER — Encounter: Payer: Self-pay | Admitting: Emergency Medicine

## 2017-04-12 ENCOUNTER — Encounter: Payer: Self-pay | Admitting: Internal Medicine

## 2017-04-12 VITALS — BP 130/72 | HR 61 | Temp 98.1°F | Resp 16 | Ht 70.0 in | Wt 180.0 lb

## 2017-04-12 DIAGNOSIS — Z23 Encounter for immunization: Secondary | ICD-10-CM | POA: Diagnosis not present

## 2017-04-12 DIAGNOSIS — E1159 Type 2 diabetes mellitus with other circulatory complications: Secondary | ICD-10-CM

## 2017-04-12 DIAGNOSIS — M159 Polyosteoarthritis, unspecified: Secondary | ICD-10-CM

## 2017-04-12 DIAGNOSIS — K219 Gastro-esophageal reflux disease without esophagitis: Secondary | ICD-10-CM | POA: Diagnosis not present

## 2017-04-12 DIAGNOSIS — G8929 Other chronic pain: Secondary | ICD-10-CM

## 2017-04-12 DIAGNOSIS — M1990 Unspecified osteoarthritis, unspecified site: Secondary | ICD-10-CM | POA: Insufficient documentation

## 2017-04-12 DIAGNOSIS — E782 Mixed hyperlipidemia: Secondary | ICD-10-CM | POA: Diagnosis not present

## 2017-04-12 DIAGNOSIS — I10 Essential (primary) hypertension: Secondary | ICD-10-CM

## 2017-04-12 LAB — HEPATITIS C ANTIBODY: HCV Ab: NEGATIVE

## 2017-04-12 MED ORDER — LEVALBUTEROL HCL 1.25 MG/3ML IN NEBU: 1.0000 | INHALATION_SOLUTION | RESPIRATORY_TRACT | 5 refills | Status: DC | PRN

## 2017-04-12 MED ORDER — HYDROCODONE-ACETAMINOPHEN 7.5-325 MG PO TABS: ORAL_TABLET | ORAL | 0 refills | Status: DC

## 2017-04-12 NOTE — Patient Instructions (Signed)
    Medications reviewed and updated.  No changes recommended at this time.  Your prescription(s) have been submitted to your pharmacy. Please take as directed and contact our office if you believe you are having problem(s) with the medication(s).      

## 2017-04-12 NOTE — Assessment & Plan Note (Signed)
BP well controlled Current regimen effective and well tolerated Continue current medications at current doses  

## 2017-04-12 NOTE — Assessment & Plan Note (Signed)
Multiple joints Pain depending on activities Chronic, intermittent pain Takes it as needed - usually in 1-2 day bursts Also takes ibuprofen

## 2017-04-12 NOTE — Progress Notes (Signed)
Pre visit review using our clinic review tool, if applicable. No additional management support is needed unless otherwise documented below in the visit note. 

## 2017-04-13 DIAGNOSIS — G8929 Other chronic pain: Secondary | ICD-10-CM | POA: Insufficient documentation

## 2017-04-13 NOTE — Assessment & Plan Note (Signed)
Lab Results  Component Value Date   HGBA1C 6.3 04/11/2017   Well controlled with lifestyle Continue exercise, healthy diet

## 2017-04-13 NOTE — Assessment & Plan Note (Signed)
Continue statin. 

## 2017-04-13 NOTE — Assessment & Plan Note (Signed)
Indication for chronic opioid: chronic arthritis that flares intermittently Medication and dose: Norco 7.5-325mg  - takes 1/2- 1 pill prn, not daily # pills per month: 30/ month maximum Last UDS date: none to date - takes intermittently, very low risk Pain contract signed (Y/N): Y, 04/12/17 Date narcotic database last reviewed (include red flags): 04/12/17, no red flags

## 2017-04-13 NOTE — Assessment & Plan Note (Signed)
No longer taking nexium - gerd fairly controlled Can take zantac if needed

## 2017-04-20 ENCOUNTER — Other Ambulatory Visit: Payer: Self-pay | Admitting: Internal Medicine

## 2017-04-20 NOTE — Telephone Encounter (Signed)
[facility]ed [facility] 

## 2017-05-02 ENCOUNTER — Ambulatory Visit (INDEPENDENT_AMBULATORY_CARE_PROVIDER_SITE_OTHER): Payer: Medicare Other | Admitting: Podiatry

## 2017-05-02 DIAGNOSIS — B079 Viral wart, unspecified: Secondary | ICD-10-CM | POA: Diagnosis not present

## 2017-05-03 NOTE — Progress Notes (Signed)
Subjective:    Patient ID: Jeffery [name], male   DOB: 76 y.o.   MRN: [id number]   HPI patient presents stating it seems to be improving but it still present and it still can be mildly painful    ROS      Objective:  Physical Exam Neurovascular status intact with lesion sub-third digit left that upon debridement shows pinpoint bleeding and pain to lateral pressure    Assessment:   Verruca plantaris still present left      Plan:    Debridement of lesion and applied chemical agent to stimulate immune system applied sterile dressing and reappoint to recheck

## 2017-05-13 ENCOUNTER — Other Ambulatory Visit: Payer: Self-pay | Admitting: Internal Medicine

## 2017-05-13 NOTE — Telephone Encounter (Signed)
Please advise. Not on med list

## 2017-06-14 ENCOUNTER — Ambulatory Visit (INDEPENDENT_AMBULATORY_CARE_PROVIDER_SITE_OTHER): Payer: Medicare Other | Admitting: Adult Health

## 2017-06-14 ENCOUNTER — Encounter: Payer: Self-pay | Admitting: Adult Health

## 2017-06-14 VITALS — BP 138/68 | HR 75 | Temp 98.2°F | Ht 70.0 in | Wt 181.4 lb

## 2017-06-14 DIAGNOSIS — J40 Bronchitis, not specified as acute or chronic: Secondary | ICD-10-CM | POA: Diagnosis not present

## 2017-06-14 MED ORDER — DOXYCYCLINE HYCLATE 100 MG PO CAPS: 100.0000 mg | ORAL_CAPSULE | Freq: Two times a day (BID) | ORAL | 0 refills | Status: DC

## 2017-06-14 MED ORDER — PREDNISONE 10 MG PO TABS: ORAL_TABLET | ORAL | 0 refills | Status: DC

## 2017-06-14 MED ORDER — IPRATROPIUM-ALBUTEROL 0.5-2.5 (3) MG/3ML IN SOLN: 3.0000 mL | Freq: Once | RESPIRATORY_TRACT | Status: DC

## 2017-06-14 MED ORDER — GLUCOSE BLOOD VI STRP
ORAL_STRIP | 3 refills | Status: DC
Start: 2017-06-14 — End: 2018-11-07

## 2017-06-14 MED ORDER — HYDROCODONE-HOMATROPINE 5-1.5 MG/5ML PO SYRP: 5.0000 mL | ORAL_SOLUTION | Freq: Three times a day (TID) | ORAL | 0 refills | Status: DC | PRN

## 2017-06-14 NOTE — Patient Instructions (Signed)
WE NOW OFFER   [facility] FAST TRACK!!!  SAME DAY Appointments for ACUTE CARE  Such as: Sprains, Injuries, cuts, abrasions, rashes, muscle pain, joint pain, back pain Colds, flu, sore throats, headache, allergies, cough, fever  Ear pain, sinus and eye infections Abdominal pain, nausea, vomiting, diarrhea, upset stomach Animal/insect bites  3 Easy Ways to Schedule: Walk-In Scheduling Call in scheduling Mychart Sign-up: [email or web address]         

## 2017-06-14 NOTE — Progress Notes (Signed)
Subjective:    Patient ID: Jeffery [name], male    DOB: 03-Sep-1941, 76 y.o.   MRN: [id number]  Cough  This is a new problem. The current episode started 1 to 4 weeks ago (10 days ). The problem has been unchanged. The cough is productive of sputum. Associated symptoms include ear pain (resolved) and shortness of breath. Pertinent negatives include no ear congestion or fever. The symptoms are aggravated by lying down. Treatments tried: mucinex  His past medical history is significant for asthma and bronchitis.  URI   Associated symptoms include coughing and ear pain (resolved). Pertinent negatives include no sinus pain.     Review of Systems  Constitutional: Negative for appetite change, fatigue and fever.  HENT: Positive for ear pain (resolved). Negative for sinus pain.   Respiratory: Positive for cough, chest tightness and shortness of breath.   Cardiovascular: Negative.   Gastrointestinal: Negative.    Past Medical History:  Diagnosis Date  . Allergy   . Arthritis   . Asthma   . Blood transfusion without reported diagnosis   . CAD (coronary artery disease)   . Diabetes mellitus without complication ([place])   . GERD (gastroesophageal reflux disease)   . HTN (hypertension)   . Hyperlipidemia   . LBBB (left bundle branch block) 1999  . Seasonal allergies   . Sleep apnea     Social History   Social History  . Marital status: Married    Spouse name: N/A  . Number of children: N/A  . Years of education: N/A   Occupational History  . Not on file.   Social History Main Topics  . Smoking status: Former Smoker    Quit date: 12/14/1963  . Smokeless tobacco: Never Used  . Alcohol use 1.2 oz/week    2 Glasses of wine per week     Comment: socially  . Drug use: No  . Sexual activity: Yes   Other Topics Concern  . Not on file   Social History Narrative  . No narrative on file    Past Surgical History:  Procedure Laterality Date  . CARDIAC CATHETERIZATION  2003   Dr Pernell Dupre; 85 % R circumflex obstruction  . CARPAL TUNNEL RELEASE Left 10/11/2014   Procedure: LEFT CARPAL TUNNEL RELEASE;  Surgeon: Roseanne Kaufman, MD;  Location: [facility];  Service: Orthopedics;  Laterality: Left;  . INGUINAL HERNIA REPAIR     right, age 37  . INGUINAL HERNIA REPAIR     left age 79  . LUMBAR LAMINECTOMY     age 49  . MINOR CARPAL TUNNEL Right 11/22/2014   Procedure: RIGHT LIMITED OPEN CARPAL TUNNEL RELEASE;  Surgeon: Roseanne Kaufman, MD;  Location: [facility];  Service: Orthopedics;  Laterality: Right;  . TONSILLECTOMY     age 76    Family History  Problem Relation Age of Onset  . Heart attack Father        44s  . Colon polyps Father   . Heart failure Mother        90s  . Subarachnoid hemorrhage Mother 23  . Hypertension Mother   . Subarachnoid hemorrhage Paternal Grandfather 52  . Diabetes Maternal Aunt   . Colon cancer Neg Hx     Allergies  Allergen Reactions  . Contrast Media [Iodinated Diagnostic Agents]     Redness and warm sensation     Current Outpatient Prescriptions on File Prior to Visit  Medication Sig Dispense Refill  .  ACCU-CHEK SOFTCLIX LANCETS lancets Use to test blood sugar daily as directed. 100 each 3  . albuterol (PROAIR HFA) 108 (90 BASE) MCG/ACT inhaler Inhale 2 puffs into the lungs as needed.      [name] aspirin 81 MG tablet Take 81 mg by mouth at bedtime.      . Blood Glucose Calibration (ACCU-CHEK AVIVA) SOLN Use as instructed to test blood sugar ICD 10 E11 51 1 each 3  . Blood Glucose Monitoring Suppl (ACCU-CHEK AVIVA PLUS) W/DEVICE KIT Use as directed 1 kit 0  . budesonide-formoterol (SYMBICORT) 160-4.5 MCG/ACT inhaler INHALE 1 TO 2 PUFFS INTO  THE LUNGS EVERY 12 HOURS AS NEEDED.  --- Office visit needed for further refills 30.6 g 0  . Coenzyme Q10 (COQ10) 100 MG CAPS Take 200 mg by mouth daily.      . Colesevelam HCl 3.75 G PACK Take 3.75 g by mouth daily. Reported on 04/27/2016    . fexofenadine  (ALLEGRA) 180 MG tablet Take 180 mg by mouth daily.    [name] glucose blood (ACCU-CHEK AVIVA PLUS) test strip Use as instructed to test blood sugar daily ICD 10 E11 51 100 each 3  . HYDROcodone-acetaminophen (NORCO) 7.5-325 MG tablet 1 q 6 hrs prn only 90 tablet 0  . ibuprofen (ADVIL,MOTRIN) 800 MG tablet Take 800 mg by mouth every 8 (eight) hours as needed.    Elmore Guise Devices (AUTOLET IMPRESSION) MISC Use as instructed to test blood sugar ICD 10 E11 51 1 each 3  . levalbuterol (XOPENEX) 1.25 MG/3ML nebulizer solution Take 1.25 mg by nebulization as needed. Use in nebulizer prn 72 mL 5  . lisinopril-hydrochlorothiazide (PRINZIDE,ZESTORETIC) 20-12.5 MG tablet Take 1 tablet by mouth daily. (Patient taking differently: Take 0.5 tablets by mouth daily. ) 90 tablet 3  . LORazepam (ATIVAN) 1 MG tablet TAKE 1/2 -1 TABLET BY MOUTH EVERY DAY AT BEDTIME AS NEEDED 30 tablet 1  . montelukast (SINGULAIR) 10 MG tablet TAKE 1 TABLET BY MOUTH  DAILY 90 tablet 3  . nebivolol (BYSTOLIC) 5 MG tablet Take 1 tablet (5 mg total) by mouth daily. 90 tablet 3  . nitroGLYCERIN (NITROSTAT) 0.4 MG SL tablet Place 1 tablet (0.4 mg total) under the tongue every 5 (five) minutes as needed. 25 tablet 3  . ONE TOUCH LANCETS MISC by Does not apply route as directed. Check blood sugar 6 time per day     . OVER THE COUNTER MEDICATION Take 1 capsule by mouth daily. Tumeric    . rosuvastatin (CRESTOR) 10 MG tablet Take 10 mg by mouth once a week. Patient takes one tab by mouth on Wednesdays    . vardenafil (LEVITRA) 20 MG tablet TAKE 1 TABLET (20 MG TOTAL) BY MOUTH AS DIRECTED. CAN NOT BE TAKENWITH NITROGLYCERIN. Need OV for future refills 6 tablet 0   No current facility-administered medications on file prior to visit.     BP 138/68 (BP Location: Left Arm, Patient Position: Sitting, Cuff Size: Normal)   Pulse 75   Temp 98.2 F (36.8 C) (Oral)   Ht _0  (1.778 m)   Wt 181 lb 6.4 oz (82.3 kg)   SpO2 98%   BMI 26.03 kg/m         Objective:   Physical Exam  Constitutional: He is oriented to person, place, and time. He appears well-developed and well-nourished. No distress.  Cardiovascular: Normal rate, regular rhythm, normal heart sounds and intact distal pulses.  Exam reveals no gallop and no friction rub.  No murmur heard. Pulmonary/Chest: Effort normal. No respiratory distress. He has wheezes in the right upper field, the right middle field, the right lower field, the left upper field, the left middle field and the left lower field. He has rhonchi in the right upper field, the right middle field, the right lower field, the left upper field and the left middle field. He has no rales. He exhibits no tenderness.  Musculoskeletal: Normal range of motion.  Neurological: He is alert and oriented to person, place, and time.  Skin: Skin is warm and dry. No rash noted. He is not diaphoretic. No erythema. No pallor.  Psychiatric: He has a normal mood and affect. His behavior is normal. Judgment and thought content normal.  Nursing note and vitals reviewed.     Assessment & Plan:  1. Bronchitis - No concern for pneumonia. Appears to be consistent with bronchitis. He responded well to duoneb and felt as though he could breath easier after. Wheezing and Rhonchi had improved significantly but had not resolved  - ipratropium-albuterol (DUONEB) 0.5-2.5 (3) MG/3ML nebulizer solution 3 mL; Take 3 mLs by nebulization once. - doxycycline (VIBRAMYCIN) 100 MG capsule; Take 1 capsule (100 mg total) by mouth 2 (two) times daily.  Dispense: 14 capsule; Refill: 0 - predniSONE (DELTASONE) 10 MG tablet; 40 mg x 3 days, 20 mg x 3 days, 10 mg x 3 days  Dispense: 21 tablet; Refill: 0 - HYDROcodone-homatropine (HYCODAN) 5-1.5 MG/5ML syrup; Take 5 mLs by mouth every 8 (eight) hours as needed for cough.  Dispense: 120 mL; Refill: 0   Dorothyann Peng, NP

## 2017-06-18 ENCOUNTER — Other Ambulatory Visit: Payer: Self-pay | Admitting: Internal Medicine

## 2017-06-18 ENCOUNTER — Other Ambulatory Visit: Payer: Self-pay | Admitting: Interventional Cardiology

## 2017-07-12 ENCOUNTER — Other Ambulatory Visit: Payer: Self-pay | Admitting: Internal Medicine

## 2017-07-13 NOTE — Telephone Encounter (Signed)
[place] Controlled Substance Database checked. Okay to fill RX. Last filled 05/31/17

## 2017-07-13 NOTE — Telephone Encounter (Signed)
RX faxed to local [facility] 

## 2017-07-27 ENCOUNTER — Other Ambulatory Visit: Payer: Self-pay | Admitting: Internal Medicine

## 2017-07-27 NOTE — Telephone Encounter (Signed)
Please advise, It looks like Med was d/ced in feb 2018

## 2017-08-16 ENCOUNTER — Other Ambulatory Visit: Payer: Self-pay | Admitting: Internal Medicine

## 2017-08-16 MED ORDER — HYDROCODONE-ACETAMINOPHEN 7.5-325 MG PO TABS: ORAL_TABLET | ORAL | 0 refills | Status: DC

## 2017-08-16 NOTE — Telephone Encounter (Signed)
Sent pt msg vis mychart informing rx ready for pick-up.Marland KitchenJohny [name]

## 2017-08-16 NOTE — Telephone Encounter (Signed)
[place] controlled substance database checked.  Ok to fill medication. Last filled 04/12/17.    rx printed -- please call him and explain I had to decrease the amount of pills I prescribed because he last had it filled 4 months ago and according to the [place] law the [place] controlled substance database needs to be checked every three months so I can only give him a three months supply - I understand his use varies....Marland KitchenMarland Kitchen

## 2017-08-26 ENCOUNTER — Telehealth: Payer: Self-pay | Admitting: Interventional Cardiology

## 2017-08-26 NOTE — Telephone Encounter (Signed)
New Message  Pt call requesting to speak with bout getting labs completed on 9/17. Please call back to discuss

## 2017-08-26 NOTE — Telephone Encounter (Signed)
Spoke with pt and he wanted to know if ok to do fasting labs Monday.  Told him this would be fine.  Pt appreciative for call

## 2017-08-29 ENCOUNTER — Encounter: Payer: Self-pay | Admitting: Interventional Cardiology

## 2017-08-29 ENCOUNTER — Ambulatory Visit (INDEPENDENT_AMBULATORY_CARE_PROVIDER_SITE_OTHER): Payer: Medicare Other | Admitting: Interventional Cardiology

## 2017-08-29 VITALS — BP 132/84 | HR 68 | Ht 69.0 in | Wt 185.2 lb

## 2017-08-29 DIAGNOSIS — I10 Essential (primary) hypertension: Secondary | ICD-10-CM | POA: Diagnosis not present

## 2017-08-29 DIAGNOSIS — G4733 Obstructive sleep apnea (adult) (pediatric): Secondary | ICD-10-CM | POA: Diagnosis not present

## 2017-08-29 DIAGNOSIS — I5042 Chronic combined systolic (congestive) and diastolic (congestive) heart failure: Secondary | ICD-10-CM | POA: Diagnosis not present

## 2017-08-29 DIAGNOSIS — I25118 Atherosclerotic heart disease of native coronary artery with other forms of angina pectoris: Secondary | ICD-10-CM | POA: Diagnosis not present

## 2017-08-29 DIAGNOSIS — I447 Left bundle-branch block, unspecified: Secondary | ICD-10-CM | POA: Diagnosis not present

## 2017-08-29 LAB — LIPID PANEL
CHOLESTEROL TOTAL: 213 mg/dL — AB (ref 100–199)
Chol/HDL Ratio: 5.2 ratio — ABNORMAL HIGH (ref 0.0–5.0)
HDL: 41 mg/dL (ref >39)
LDL Calculated: 139 mg/dL — ABNORMAL HIGH (ref 0–99)
Triglycerides: 165 mg/dL — ABNORMAL HIGH (ref 0–149)
VLDL CHOLESTEROL CAL: 33 mg/dL (ref 5–40)

## 2017-08-29 LAB — COMPREHENSIVE METABOLIC PANEL
A/G RATIO: 1.8 (ref 1.2–2.2)
ALBUMIN: 4.6 g/dL (ref 3.5–4.8)
ALT: 20 IU/L (ref 0–44)
AST: 20 IU/L (ref 0–40)
Alkaline Phosphatase: 84 IU/L (ref 39–117)
BUN / CREAT RATIO: 18 (ref 10–24)
BUN: 20 mg/dL (ref 8–27)
Bilirubin Total: 0.2 mg/dL (ref 0.0–1.2)
CALCIUM: 10.2 mg/dL (ref 8.6–10.2)
CO2: 25 mmol/L (ref 20–29)
Chloride: 101 mmol/L (ref 96–106)
Creatinine, Ser: 1.11 mg/dL (ref 0.76–1.27)
GFR, EST AFRICAN AMERICAN: 75 mL/min/{1.73_m2} (ref >59)
GFR, EST NON AFRICAN AMERICAN: 65 mL/min/{1.73_m2} (ref >59)
GLUCOSE: 90 mg/dL (ref 65–99)
Globulin, Total: 2.5 g/dL (ref 1.5–4.5)
Potassium: 5.2 mmol/L (ref 3.5–5.2)
Sodium: 141 mmol/L (ref 134–144)
TOTAL PROTEIN: 7.1 g/dL (ref 6.0–8.5)

## 2017-08-29 LAB — CBC
Hematocrit: 43.7 % (ref 37.5–51.0)
Hemoglobin: 14.5 g/dL (ref 13.0–17.7)
MCH: 29.1 pg (ref 26.6–33.0)
MCHC: 33.2 g/dL (ref 31.5–35.7)
MCV: 88 fL (ref 79–97)
PLATELETS: 344 10*3/uL (ref 150–379)
RBC: 4.98 x10E6/uL (ref 4.14–5.80)
RDW: 14.4 % (ref 12.3–15.4)
WBC: 10.6 10*3/uL (ref 3.4–10.8)

## 2017-08-29 NOTE — Progress Notes (Signed)
Cardiology Office Note    Date:  08/29/2017   ID:  [name], DOB 05/29/1941, MRN [id number]  PCP:  [name], MD  Cardiologist: Sinclair Grooms, MD   Chief Complaint  Patient presents with  . Coronary Artery Disease    History of Present Illness:  Jeffery [name] is a 76 y.o. male who is being seen to follow-up left bundle branch block, hypertension, hyperlipidemia, and encouragement concerning management of obstructive sleep apnea, diabetes mellitus and obesity.Randall Hiss had coronary angiography performed in 2002. At that time he had moderate disease in each of 2 obtuse marginal branches. He has done well since that time without chest discomfort or ischemic complaints until the past 8 weeks. In this time frame he has had 3 occasions where there was discomfort in the jaws and upper throat bilaterally. Each occasion, was relieved by sublingual nitroglycerin within 90 seconds. Subsequent to each hand to this point he is fully physically active without limitations or exertion related discomfort. Each of the episodes of throat discomfort have occurred in the setting of emotional stress surrounding his brother-in-law's death. Other stressors include 2 young grandchildren and his daughter living within his home causing him to have heavy responsibility for childcare.  He denies orthopnea. Does have exertional dyspnea. He has occasional wheezing. No lower extremity swelling. His loss of proximally 20 pounds since last seen by me. Medication compliance is advocated.   Past Medical History:  Diagnosis Date  . Allergy   . Arthritis   . Asthma   . Blood transfusion without reported diagnosis   . CAD (coronary artery disease)   . Diabetes mellitus without complication ([place])   . GERD (gastroesophageal reflux disease)   . HTN (hypertension)   . Hyperlipidemia   . LBBB (left bundle branch block) 1999  . Seasonal allergies   . Sleep apnea     Past Surgical History:  Procedure  Laterality Date  . CARDIAC CATHETERIZATION  2003   Dr Pernell Dupre; 85 % R circumflex obstruction  . CARPAL TUNNEL RELEASE Left 10/11/2014   Procedure: LEFT CARPAL TUNNEL RELEASE;  Surgeon: Roseanne Kaufman, MD;  Location: [facility];  Service: Orthopedics;  Laterality: Left;  . INGUINAL HERNIA REPAIR     right, age 73  . INGUINAL HERNIA REPAIR     left age 74  . LUMBAR LAMINECTOMY     age 44  . MINOR CARPAL TUNNEL Right 11/22/2014   Procedure: RIGHT LIMITED OPEN CARPAL TUNNEL RELEASE;  Surgeon: Roseanne Kaufman, MD;  Location: [facility];  Service: Orthopedics;  Laterality: Right;  . TONSILLECTOMY     age 27    Current Medications: Outpatient Medications Prior to Visit  Medication Sig Dispense Refill  . ACCU-CHEK SOFTCLIX LANCETS lancets Use to test blood sugar daily as directed. 100 each 3  . albuterol (PROAIR HFA) 108 (90 BASE) MCG/ACT inhaler Inhale 2 puffs into the lungs as needed.      [name] aspirin 81 MG tablet Take 81 mg by mouth at bedtime.      . Blood Glucose Calibration (ACCU-CHEK AVIVA) SOLN Use as instructed to test blood sugar ICD 10 E11 51 1 each 3  . Blood Glucose Monitoring Suppl (ACCU-CHEK AVIVA PLUS) W/DEVICE KIT Use as directed 1 kit 0  . Coenzyme Q10 (COQ10) 100 MG CAPS Take 200 mg by mouth daily.      [name] doxycycline (VIBRAMYCIN) 100 MG capsule Take 1 capsule (100 mg total) by  mouth 2 (two) times daily. 14 capsule 0  . fexofenadine (ALLEGRA) 180 MG tablet Take 180 mg by mouth daily as needed for allergies.     [name] glucose blood (ACCU-CHEK AVIVA PLUS) test strip Use as instructed to test blood sugar daily ICD 10 E11 51 100 each 3  . HYDROcodone-acetaminophen (NORCO) 7.5-325 MG tablet 1 q 6 hrs prn only 70 tablet 0  . ibuprofen (ADVIL,MOTRIN) 800 MG tablet Take 800 mg by mouth every 8 (eight) hours as needed.    Elmore Guise Devices (AUTOLET IMPRESSION) MISC Use as instructed to test blood sugar ICD 10 E11 51 1 each 3  . levalbuterol (XOPENEX) 1.25  MG/3ML nebulizer solution Take 1.25 mg by nebulization as needed. Use in nebulizer prn 72 mL 5  . lisinopril-hydrochlorothiazide (PRINZIDE,ZESTORETIC) 20-12.5 MG tablet Take 1 tablet by mouth daily. Please call [phone] to schedule appointment 1st attempt. 90 tablet 0  . LORazepam (ATIVAN) 1 MG tablet TAKE 1/2 TO 1 TAB BY MOUTH EVERY DAY AT BEDTIME AS NEEDED 30 tablet 1  . montelukast (SINGULAIR) 10 MG tablet TAKE 1 TABLET BY MOUTH  DAILY 90 tablet 3  . nebivolol (BYSTOLIC) 5 MG tablet Take 1 tablet (5 mg total) by mouth daily. Please call [phone] to schedule appointment 1st attempt. 90 tablet 0  . nitroGLYCERIN (NITROSTAT) 0.4 MG SL tablet Place 1 tablet (0.4 mg total) under the tongue every 5 (five) minutes as needed. 25 tablet 3  . ONE TOUCH LANCETS MISC by Does not apply route as directed. Check blood sugar 6 time per day     . OVER THE COUNTER MEDICATION Take 1 capsule by mouth daily. Tumeric    . predniSONE (DELTASONE) 10 MG tablet 40 mg x 3 days, 20 mg x 3 days, 10 mg x 3 days 21 tablet 0  . rosuvastatin (CRESTOR) 10 MG tablet Take 10 mg by mouth once a week. Patient takes one tab by mouth on Wednesdays    . SYMBICORT 160-4.5 MCG/ACT inhaler INHALE 1 TO 2 PUFFS INTO  THE LUNGS EVERY 12 HOURS AS NEEDED 30.6 g 3  . vardenafil (LEVITRA) 20 MG tablet TAKE 1 TABLET (20 MG TOTAL) BY MOUTH AS DIRECTED. CAN NOT BE TAKENWITH NITROGLYCERIN. Need OV for future refills 6 tablet 0  . Colesevelam HCl 3.75 G PACK Take 3.75 g by mouth daily. Reported on 04/27/2016    . HYDROcodone-homatropine (HYCODAN) 5-1.5 MG/5ML syrup Take 5 mLs by mouth every 8 (eight) hours as needed for cough. (Patient not taking: Reported on 08/29/2017) 120 mL 0   Facility-Administered Medications Prior to Visit  Medication Dose Route Frequency Provider Last Rate Last Dose  . ipratropium-albuterol (DUONEB) 0.5-2.5 (3) MG/3ML nebulizer solution 3 mL  3 mL Nebulization Once Nafziger, Tommi Rumps, NP         Allergies:   Contrast  media [iodinated diagnostic agents]   Social History   Social History  . Marital status: Married    Spouse name: N/A  . Number of children: N/A  . Years of education: N/A   Social History Main Topics  . Smoking status: Former Smoker    Quit date: 12/14/1963  . Smokeless tobacco: Never Used  . Alcohol use 1.2 oz/week    2 Glasses of wine per week     Comment: socially  . Drug use: No  . Sexual activity: Yes   Other Topics Concern  . None   Social History Narrative  . None     Family History:  The  patient's Dictation #1 [id number]  [id number] Dictation #2 [id number]  [id number]  family history includes Colon polyps in his father; Diabetes in his maternal aunt; Heart attack in his father; Heart failure in his mother; Hypertension in his mother; Subarachnoid hemorrhage (age of onset: 6) in his mother; Subarachnoid hemorrhage (age of onset: 61) in his paternal grandfather.   ROS:   Please see the history of present illness.    Bilateral leg pain, excessive fatigue, anxiety, difficulty with balance, recurring cough, emotional stress.  All other systems reviewed and are negative.   PHYSICAL EXAM:   VS:  BP 132/84 (BP Location: Left Arm)   Pulse 68   Ht 5' 9" (1.753 m)   Wt 185 lb 3.2 oz (84 kg)   BMI 27.35 kg/m    GEN: Well nourished, well developed, in no acute distress  HEENT: normal  Neck: no JVD, carotid bruits, or masses Cardiac: RRR; no murmurs, rubs, or gallops,no edema  Respiratory:  clear to auscultation bilaterally, normal work of breathing GI: soft, nontender, nondistended, + BS MS: no deformity or atrophy  Skin: warm and dry, no rash Neuro:  Alert and Oriented x 3, Strength and sensation are intact Psych: euthymic mood, full affect  Wt Readings from Last 3 Encounters:  08/29/17 185 lb 3.2 oz (84 kg)  06/14/17 181 lb 6.4 oz (82.3 kg)  04/12/17 180 lb (81.6 kg)      Studies/Labs Reviewed:   EKG:  EKG  Left bundle branch block, normal  sinus rhythm, left axis deviation.  Recent Labs: No results found for requested labs within last 8760 hours.   Lipid Panel    Component Value Date/Time   CHOL 177 07/26/2016 0741   CHOL 143 08/20/2014 0750   TRIG 168 (H) 07/26/2016 0741   TRIG 145 08/20/2014 0750   HDL 32 (L) 07/26/2016 0741   HDL 33 (L) 08/20/2014 0750   CHOLHDL 5.5 (H) 07/26/2016 0741   VLDL 34 (H) 07/26/2016 0741   LDLCALC 111 07/26/2016 0741   LDLCALC 81 08/20/2014 0750    Additional studies/ records that were reviewed today include:  No recent functional testing.    ASSESSMENT:    1. Atherosclerosis of native coronary artery of native heart with stable angina pectoris ([place])   2. Essential hypertension   3. Chronic combined systolic and diastolic CHF (congestive heart failure) ([place])   4. Left bundle branch block   5. OSA (obstructive sleep apnea)      PLAN:  In order of problems listed above:  1. The neck discomfort could be angina. A myocardial perfusion pharmacologic stress study will be performed to exclude high risk subset. The nonexertional nature of the discomfort raises the possibility of vasoconstriction related angina. Continue to use nitroglycerin as needed. Last episode was greater than 4 weeks ago. Cardiac cath performed in 2002 demonstrated moderate obtuse marginal disease involving 2 branches 2. Adequate control of blood pressure is noted. Low salt diet and is reiterated. 3. No evidence of volume overload. 4. Unchanged from prior study. 5. Continue Cipro abuse.  Clinical follow-up in one year. Liver and lipid panel along with electrolytes will be obtained today. A pharmacologic myocardial perfusion study will be done to exclude significant obstructive disease progression.    Medication Adjustments/Labs and Tests Ordered: Current medicines are reviewed at length with the patient today.  Concerns regarding medicines are outlined above.  Medication changes, Labs and Tests ordered today  are listed in the Patient Instructions below. Patient Instructions  Medication  Instructions:  None  Labwork: CMET, CBC and Lipid today  Testing/Procedures: Your physician has requested that you have a lexiscan myoview. For further information please visit [email or web address]. Please follow instruction sheet, as given.   Follow-Up: Your physician wants you to follow-up in: 1 year with Dr. Tamala Julian.  You will receive a reminder letter in the mail two months in advance. If you don't receive a letter, please call our office to schedule the follow-up appointment.   Any Other Special Instructions Will Be Listed Below (If Applicable).     If you need a refill on your cardiac medications before your next appointment, please call your pharmacy.      Signed, Sinclair Grooms, MD  08/29/2017 11:07 AM    [facility] [place], [place], [place]  [address] Phone: [phone]; Fax: [phone]

## 2017-08-29 NOTE — Patient Instructions (Signed)
Medication Instructions:  None  Labwork: CMET, CBC and Lipid today  Testing/Procedures: Your physician has requested that you have a lexiscan myoview. For further information please visit [email or web address]. Please follow instruction sheet, as given.   Follow-Up: Your physician wants you to follow-up in: 1 year with Dr. Tamala Julian.  You will receive a reminder letter in the mail two months in advance. If you don't receive a letter, please call our office to schedule the follow-up appointment.   Any Other Special Instructions Will Be Listed Below (If Applicable).     If you need a refill on your cardiac medications before your next appointment, please call your pharmacy.

## 2017-08-30 ENCOUNTER — Telehealth: Payer: Self-pay | Admitting: *Deleted

## 2017-08-30 DIAGNOSIS — E785 Hyperlipidemia, unspecified: Secondary | ICD-10-CM

## 2017-08-30 MED ORDER — ROSUVASTATIN CALCIUM 5 MG PO TABS: 5.0000 mg | ORAL_TABLET | ORAL | 3 refills | Status: DC

## 2017-08-30 NOTE — Telephone Encounter (Addendum)
Spoke with pt and went over lab results and recommendations per Dr. Tamala Julian.  Scheduled pt to have repeat lipid panel on 10/11/17.  Pt verbalized understanding and was in agreement with this plan.

## 2017-08-30 NOTE — Telephone Encounter (Signed)
-----   Message from Belva Crome, MD sent at 08/30/2017  2:42 PM EDT ----- Let the patient know the lipid sore unacceptable. LDL is 140. Previously 111. Increase Crestor to 5 mg 3 times per week. If unable to titrate statin, we will refer to lipid clinic. After increasing Crestor dose, repeat lipid panel 6 weeks later. A copy will be sent to [name], MD

## 2017-09-12 ENCOUNTER — Telehealth (HOSPITAL_COMMUNITY): Payer: Self-pay | Admitting: *Deleted

## 2017-09-12 ENCOUNTER — Telehealth: Payer: Self-pay | Admitting: Emergency Medicine

## 2017-09-12 NOTE — Telephone Encounter (Signed)
Spoke with pt, pt understood changes and will pick up RX at Pharmacy for 70 tab.

## 2017-09-12 NOTE — Telephone Encounter (Signed)
Spoke with pt, states his Hydrocodone RX is usually for 90 tablets. Last RX given was for 70 tablets. Last filled on 04/12/17. He did not pick the RX for 70. Are you okay with me printing an RX for 90 tablets?

## 2017-09-12 NOTE — Telephone Encounter (Signed)
Pt called and asked that you give him a call back about his prescriptions. Thanks.

## 2017-09-12 NOTE — Telephone Encounter (Signed)
Patient's wife, per dpr, given detailed instructions per Myocardial Perfusion Study Information Sheet for the test on 09/14/17. Patient notified to arrive 15 minutes early and that it is imperative to arrive on time for appointment to keep from having the test rescheduled.  If you need to cancel or reschedule your appointment, please call the office within 24 hours of your appointment. . Patient verbalized understanding. Kirstie Peri

## 2017-09-12 NOTE — Telephone Encounter (Signed)
I can only prescribe a 90 day supply and the 90 pills have been lasting him longer.  We will review this at his next visit, but it short - he will need to be seen twice a year, possibly every 3 months.  After I see him next hydrocodone prescriptions can no longer be left at the front desk for pick up  - he will need to pick it up at an appointment for "chronic pain management" -  This is going to be the new policy.

## 2017-09-14 ENCOUNTER — Other Ambulatory Visit: Payer: Self-pay | Admitting: Interventional Cardiology

## 2017-09-14 ENCOUNTER — Ambulatory Visit (HOSPITAL_COMMUNITY): Payer: Medicare Other | Attending: Cardiology

## 2017-09-14 DIAGNOSIS — I25118 Atherosclerotic heart disease of native coronary artery with other forms of angina pectoris: Secondary | ICD-10-CM

## 2017-09-14 DIAGNOSIS — R9439 Abnormal result of other cardiovascular function study: Secondary | ICD-10-CM | POA: Insufficient documentation

## 2017-09-14 LAB — MYOCARDIAL PERFUSION IMAGING
CHL CUP NUCLEAR SDS: 0
CHL CUP NUCLEAR SRS: 18
CHL CUP RESTING HR STRESS: 49 {beats}/min
LHR: 0.34
LV dias vol: 134 mL (ref 62–150)
LV sys vol: 78 mL
NUC STRESS TID: 1.04
Peak HR: 80 {beats}/min
SSS: 18

## 2017-09-14 MED ORDER — TECHNETIUM TC 99M TETROFOSMIN IV KIT
10.7000 | PACK | Freq: Once | INTRAVENOUS | Status: AC | PRN
  Administered 2017-09-14: 10.7 via INTRAVENOUS
  Filled 2017-09-14: qty 11

## 2017-09-14 MED ORDER — REGADENOSON 0.4 MG/5ML IV SOLN
0.4000 mg | Freq: Once | INTRAVENOUS | Status: AC
  Administered 2017-09-14: 0.4 mg via INTRAVENOUS

## 2017-09-14 MED ORDER — TECHNETIUM TC 99M TETROFOSMIN IV KIT
32.7000 | PACK | Freq: Once | INTRAVENOUS | Status: AC | PRN
  Administered 2017-09-14: 32.7 via INTRAVENOUS
  Filled 2017-09-14: qty 33

## 2017-09-27 ENCOUNTER — Other Ambulatory Visit: Payer: Self-pay | Admitting: Interventional Cardiology

## 2017-10-05 ENCOUNTER — Other Ambulatory Visit: Payer: Self-pay | Admitting: Internal Medicine

## 2017-10-06 ENCOUNTER — Telehealth: Payer: Self-pay | Admitting: Interventional Cardiology

## 2017-10-06 NOTE — Telephone Encounter (Signed)
New Message  Pt call requesting to speak with RN about lab work schedule for 10/30. Pt would like to confirm why he need to have this completed. Please call back to discuss

## 2017-10-06 NOTE — Telephone Encounter (Signed)
Spoke with pt and made him aware that labs are being drawn d/t change in medication after last Lipid panel.  Pt verbalized understanding and was in agreement to have labs drawn.

## 2017-10-06 NOTE — Telephone Encounter (Signed)
[place] Controlled Substance Database checked. Last filled on 08/29/17

## 2017-10-07 NOTE — Telephone Encounter (Signed)
[facility]ed [facility] 

## 2017-10-11 ENCOUNTER — Other Ambulatory Visit: Payer: Medicare Other

## 2017-12-14 ENCOUNTER — Other Ambulatory Visit: Payer: Self-pay | Admitting: Internal Medicine

## 2017-12-15 MED ORDER — HYDROCODONE-ACETAMINOPHEN 7.5-325 MG PO TABS: ORAL_TABLET | ORAL | 0 refills | Status: DC

## 2017-12-15 MED ORDER — VARDENAFIL HCL 20 MG PO TABS: ORAL_TABLET | ORAL | 0 refills | Status: DC

## 2017-12-15 NOTE — Telephone Encounter (Signed)
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

## 2017-12-15 NOTE — Telephone Encounter (Signed)
[place] Controlled Substance Database checked. Last filled on 08/16/17. Does he need an OV for pain med?

## 2017-12-25 ENCOUNTER — Other Ambulatory Visit: Payer: Self-pay | Admitting: Internal Medicine

## 2017-12-26 NOTE — Telephone Encounter (Signed)
[place] Controlled Substance Database checked. Last filled on 11/16/17 

## 2018-01-24 ENCOUNTER — Other Ambulatory Visit: Payer: Self-pay | Admitting: Interventional Cardiology

## 2018-01-30 ENCOUNTER — Other Ambulatory Visit: Payer: Self-pay

## 2018-01-30 MED ORDER — NITROGLYCERIN 0.4 MG SL SUBL: 0.4000 mg | SUBLINGUAL_TABLET | SUBLINGUAL | 0 refills | Status: DC | PRN

## 2018-02-03 ENCOUNTER — Other Ambulatory Visit: Payer: Self-pay | Admitting: Internal Medicine

## 2018-02-04 NOTE — Telephone Encounter (Signed)
[place] Controlled Substance Database checked. Last filled on 12/26/17 

## 2018-03-02 ENCOUNTER — Ambulatory Visit: Payer: Medicare Other | Admitting: Podiatry

## 2018-03-02 ENCOUNTER — Encounter: Payer: Self-pay | Admitting: Podiatry

## 2018-03-02 DIAGNOSIS — B079 Viral wart, unspecified: Secondary | ICD-10-CM | POA: Diagnosis not present

## 2018-03-02 NOTE — Progress Notes (Signed)
Subjective:   Patient ID: Jeffery [name], male   DOB: 77 y.o.   MRN: [id number]   HPI Patient presents with a painful lesion on the third toe left that was better for about a year and has reoccurred.  States it seems to have grown over the last month or 2    ROS      Objective:  Physical Exam  Neurovascular status intact with patient's left third digit found to have a lesion that is upon debridement painful with pressed with pinpoint bleeding and painful to lateral pressure.  It is localized to this area     Assessment:  Probability for verruca plantaris third digit left plantar toe     Plan:  H&P condition reviewed and sharp debridement accomplished and applied a chemical agent to create immune response with sterile dressing.  Reappoint to recheck

## 2018-03-03 ENCOUNTER — Other Ambulatory Visit: Payer: Self-pay | Admitting: Internal Medicine

## 2018-03-07 ENCOUNTER — Encounter: Payer: Self-pay | Admitting: Family Medicine

## 2018-03-07 ENCOUNTER — Ambulatory Visit (INDEPENDENT_AMBULATORY_CARE_PROVIDER_SITE_OTHER)
Admission: RE | Admit: 2018-03-07 | Discharge: 2018-03-07 | Disposition: A | Discharge status: Home / Self Care | Payer: Medicare Other | Source: Ambulatory Visit | Attending: Family Medicine | Admitting: Family Medicine

## 2018-03-07 ENCOUNTER — Ambulatory Visit: Payer: Medicare Other | Admitting: Family Medicine

## 2018-03-07 ENCOUNTER — Other Ambulatory Visit: Payer: Self-pay | Admitting: *Deleted

## 2018-03-07 ENCOUNTER — Ambulatory Visit: Payer: Self-pay

## 2018-03-07 VITALS — BP 132/80 | HR 67 | Ht 69.0 in | Wt 202.0 lb

## 2018-03-07 DIAGNOSIS — S92355A Nondisplaced fracture of fifth metatarsal bone, left foot, initial encounter for closed fracture: Secondary | ICD-10-CM | POA: Diagnosis not present

## 2018-03-07 DIAGNOSIS — M79672 Pain in left foot: Secondary | ICD-10-CM

## 2018-03-07 DIAGNOSIS — S92309A Fracture of unspecified metatarsal bone(s), unspecified foot, initial encounter for closed fracture: Secondary | ICD-10-CM

## 2018-03-07 DIAGNOSIS — M19072 Primary osteoarthritis, left ankle and foot: Secondary | ICD-10-CM | POA: Diagnosis not present

## 2018-03-07 HISTORY — DX: Fracture of unspecified metatarsal bone(s), unspecified foot, initial encounter for closed fracture: S92.309A

## 2018-03-07 NOTE — Patient Instructions (Signed)
Good to see you  Jeffery [name] is your friend.  Vitamin D 4000 IU daily with K2 Wear boot with weight bearing until I see you again.  Ice 20 minutes 2 times daily. Usually after activity and before bed. pennsaid pinkie amount topically 2 times daily as needed.   Take boot off when sitting.  See me again in 2-3 weeks

## 2018-03-07 NOTE — Assessment & Plan Note (Signed)
Fifth metatarsal fracture.  Discussed icing regimen and home exercises, Cam walker given, discussed vitamin D to help with healing.  Topical anti-inflammatories given.  Follow-up again in 4-6 weeks

## 2018-03-07 NOTE — Progress Notes (Signed)
[facility] [address] [place], [place] [address] Phone: [phone] Subjective:      CC: Foot pain  [id number]  Jeffery [name] is a 77 y.o. male coming in with complaint of left foot pain. Stepped on an incline. Heard a pop.   Onset- 3pm today Location- Base of the 5th, midfoot Duration-pain seems to be constant Character- Dull Aggravating factors- Bearing weight Therapies tried- [employer] out of 10   Patient did have x-rays that shows an avulsion fracture of the lateral aspect of the fifth metatarsal.  Past Medical History:  Diagnosis Date  . Allergy   . Arthritis   . Asthma   . Blood transfusion without reported diagnosis   . CAD (coronary artery disease)   . Diabetes mellitus without complication ([place])   . GERD (gastroesophageal reflux disease)   . HTN (hypertension)   . Hyperlipidemia   . LBBB (left bundle branch block) 1999  . Seasonal allergies   . Sleep apnea    Past Surgical History:  Procedure Laterality Date  . CARDIAC CATHETERIZATION  2003   Dr Pernell Dupre; 85 % R circumflex obstruction  . CARPAL TUNNEL RELEASE Left 10/11/2014   Procedure: LEFT CARPAL TUNNEL RELEASE;  Surgeon: Roseanne Kaufman, MD;  Location: [facility];  Service: Orthopedics;  Laterality: Left;  . INGUINAL HERNIA REPAIR     right, age 55  . INGUINAL HERNIA REPAIR     left age 51  . LUMBAR LAMINECTOMY     age 89  . MINOR CARPAL TUNNEL Right 11/22/2014   Procedure: RIGHT LIMITED OPEN CARPAL TUNNEL RELEASE;  Surgeon: Roseanne Kaufman, MD;  Location: [facility];  Service: Orthopedics;  Laterality: Right;  . TONSILLECTOMY     age 80   Social History   Socioeconomic History  . Marital status: Married    Spouse name: Not on file  . Number of children: Not on file  . Years of education: Not on file  . Highest education level: Not on file  Occupational History  . Not on file  Social Needs  . Financial resource  strain: Not on file  . Food insecurity:    Worry: Not on file    Inability: Not on file  . Transportation needs:    Medical: Not on file    Non-medical: Not on file  Tobacco Use  . Smoking status: Former Smoker    Last attempt to quit: 12/14/1963    Years since quitting: 54.2  . Smokeless tobacco: Never Used  Substance and Sexual Activity  . Alcohol use: Yes    Alcohol/week: 1.2 oz    Types: 2 Glasses of wine per week    Comment: socially  . Drug use: No  . Sexual activity: Yes  Lifestyle  . Physical activity:    Days per week: Not on file    Minutes per session: Not on file  . Stress: Not on file  Relationships  . Social connections:    Talks on phone: Not on file    Gets together: Not on file    Attends religious service: Not on file    Active member of club or organization: Not on file    Attends meetings of clubs or organizations: Not on file    Relationship status: Not on file  Other Topics Concern  . Not on file  Social History Narrative  . Not on file   Allergies  Allergen Reactions  . Contrast  Media [Iodinated Diagnostic Agents]     Redness and warm sensation    Family History  Problem Relation Age of Onset  . Heart attack Father        52s  . Colon polyps Father   . Heart failure Mother        90s  . Subarachnoid hemorrhage Mother 69  . Hypertension Mother   . Subarachnoid hemorrhage Paternal Grandfather 55  . Diabetes Maternal Aunt   . Colon cancer Neg Hx      Past medical history, social, surgical and family history all reviewed in electronic medical record.  No pertanent information unless stated regarding to the chief complaint.   Review of Systems:Review of systems updated and as accurate as of 03/07/18  No headache, visual changes, nausea, vomiting, diarrhea, constipation, dizziness, abdominal pain, skin rash, fevers, chills, night sweats, weight loss, swollen lymph nodes, body aches, joint swelling, muscle aches, chest pain, shortness of  breath, mood changes.   Objective  There were no vitals taken for this visit. Systems examined below as of 03/07/18   General: No apparent distress alert and oriented x3 mood and affect normal, dressed appropriately.  HEENT: Pupils equal, extraocular movements intact  Respiratory: Patient's speak in full sentences and does not appear short of breath  Cardiovascular: No lower extremity edema, non tender, no erythema  Skin: Warm dry intact with no signs of infection or rash on extremities or on axial skeleton.  Abdomen: Soft nontender  Neuro: Cranial nerves II through XII are intact, neurovascularly intact in all extremities with 2+ DTRs and 2+ pulses.  Lymph: No lymphadenopathy of posterior or anterior cervical chain or axillae bilaterally.  Gait antalgic gait MSK:  Non tender with full range of motion and good stability and symmetric strength and tone of shoulders, elbows, wrist, hip, knee and ankles bilaterally.  Arthritic changes of joints Left foot exam shows some swelling over the fifth metatarsal as well as the lateral aspect the ankle.  No pain over the lateral malleolus.  Moderate to severe pain over the fourth and fifth metatarsals proximally.  Neurovascular intact distally.  Pain over the peroneal tendons just inferior to the lateral malleolus.  Limited musculoskeletal ultrasound was performed and interpreted by Lyndal Pulley  Limited ultrasound of the fifth and fourth metatarsal material seen no acute fractures noted.  Patient also has a peroneal subluxation noted.  Possible partial tearing noted as well with soft tissue swelling noted.  Injury to the ATFL noted.  No joint effusion. Impression: Fourth and fifth metatarsal fractures proximally that seems to be extra-articular, peroneal subluxation and second-degree ankle sprain     Impression and Recommendations:     This case required medical decision making of moderate complexity.      Note: This dictation was prepared  with Dragon dictation along with smaller phrase technology. Any transcriptional errors that result from this process are unintentional.

## 2018-03-08 ENCOUNTER — Other Ambulatory Visit: Payer: Self-pay | Admitting: Internal Medicine

## 2018-03-08 MED ORDER — LORAZEPAM 1 MG PO TABS: ORAL_TABLET | ORAL | 0 refills | Status: DC

## 2018-03-08 NOTE — Telephone Encounter (Signed)
[place] Controlled Substance Database checked. Last filled on 02/04/18

## 2018-03-13 ENCOUNTER — Encounter: Payer: Self-pay | Admitting: Family Medicine

## 2018-03-13 ENCOUNTER — Other Ambulatory Visit: Payer: Self-pay | Admitting: Internal Medicine

## 2018-03-13 MED ORDER — HYDROCODONE-ACETAMINOPHEN 7.5-325 MG PO TABS: ORAL_TABLET | ORAL | 0 refills | Status: DC

## 2018-03-13 NOTE — Telephone Encounter (Signed)
Pls contact pt to make f/u ov per MD../lmb

## 2018-03-13 NOTE — Telephone Encounter (Signed)
Appointment scheduled.

## 2018-03-13 NOTE — Telephone Encounter (Signed)
Check [place] registry last filled 12/15/2017.Marland KitchenJohny [name]

## 2018-03-13 NOTE — Telephone Encounter (Signed)
rx sent - he needs a f/u

## 2018-03-26 DIAGNOSIS — F419 Anxiety disorder, unspecified: Secondary | ICD-10-CM | POA: Insufficient documentation

## 2018-03-26 NOTE — Patient Instructions (Addendum)
  Test(s) ordered today. Your results will be released to MyChart (or called to you) after review, usually within 72hours after test completion. If any changes need to be made, you will be notified at that same time.  All other Health Maintenance issues reviewed.   All recommended immunizations and age-appropriate screenings are up-to-date or discussed.  No immunizations administered today.   Medications reviewed and updated.  No changes recommended at this time.   Please followup in 3 months   

## 2018-03-26 NOTE — Progress Notes (Signed)
Subjective:    Patient ID: Jeffery [name], male    DOB: 1941-10-18, 77 y.o.   MRN: [id number]  HPI The patient is here for follow up for chronic pain management.  Indication for chronic opioid: chronic arthritis, multiple joints (hands, myalgias) Medication and dose: hydrocodone-acetaminophen 7.5-325 mg 1 daily # pills per month: 90 Last UDS date: 03/27/18 Pain contract signed (Y/N): Y 04/12/17, will have new contract signed today Date narcotic database last reviewed (include red flags): last fill 03/13/18, checked 03/27/18  Pain assessment:  Pain intensity: 5/10  Amount of pain relief with medication: significant pain relief Use of pain medications: taking medication appropraiately Side effects:  none Sleep:  good Mood: Overall good-denies depression.  Anxiety controlled Functional/social activities: continues to be active in home setting  - able to do yard work  Last took prescribed pain medication: yesterday Alcohol use:  socially Tobacco use:   none Street Drug use: none   Diabetes: He is controlling his sugars with lifestyle.  He thinks he may need to restart the metformin.  He is compliant with a diabetic diet. He is exercising irregularly. He monitors his sugars and they have been running higher - 96-126.  Overall his sugars are higher than they have been.  He checks his feet daily and denies foot lesions. He is up-to-date with an ophthalmology examination.   Hypertension: He is taking his medication daily. He is compliant with a low sodium diet.  He denies chest pain, palpitations, edema, shortness of breath and regular headaches. He is exercising irregularly.  He does not monitor his blood pressure at home.    Anxiety: He is taking his medication daily as prescribed. He denies any side effects from the medication. He feels his anxiety is well controlled and he is happy with his current dose of medication.    Medications and allergies reviewed with patient and updated if  appropriate.  Patient Active Problem List   Diagnosis Date Noted  . Anxiety 03/26/2018  . Metatarsal bone fracture 03/07/2018  . Encounter for chronic pain management 04/13/2017  . Osteoarthritis 04/12/2017  . Chronic combined systolic and diastolic CHF (congestive heart failure) ([place]) 01/24/2015  . OSA (obstructive sleep apnea) 10/03/2014  . Left bundle branch block 11/27/2013  . Essential hypertension 11/27/2013  . GERD (gastroesophageal reflux disease) 02/27/2013  . Type 2 diabetes mellitus with vascular disease ([place]) 04/09/2009  . Coronary atherosclerosis 09/06/2008  . Asthma 09/06/2008    Current Outpatient Medications on File Prior to Visit  Medication Sig Dispense Refill  . ACCU-CHEK SOFTCLIX LANCETS lancets Use to test blood sugar daily as directed. 100 each 3  . albuterol (PROAIR HFA) 108 (90 BASE) MCG/ACT inhaler Inhale 2 puffs into the lungs as needed.      [name] aspirin 81 MG tablet Take 81 mg by mouth at bedtime.      . Blood Glucose Calibration (ACCU-CHEK AVIVA) SOLN Use as instructed to test blood sugar ICD 10 E11 51 1 each 3  . Blood Glucose Monitoring Suppl (ACCU-CHEK AVIVA PLUS) W/DEVICE KIT Use as directed 1 kit 0  . Coenzyme Q10 (COQ10) 100 MG CAPS Take 200 mg by mouth daily.      . fexofenadine (ALLEGRA) 180 MG tablet Take 180 mg by mouth daily as needed for allergies.     [name] glucose blood (ACCU-CHEK AVIVA PLUS) test strip Use as instructed to test blood sugar daily ICD 10 E11 51 100 each 3  . HYDROcodone-acetaminophen (NORCO) 7.5-325 MG tablet  1 q 6 hrs prn for chronic joint pain 70 tablet 0  . ibuprofen (ADVIL,MOTRIN) 800 MG tablet Take 800 mg by mouth every 8 (eight) hours as needed.    Elmore Guise Devices (AUTOLET IMPRESSION) MISC Use as instructed to test blood sugar ICD 10 E11 51 1 each 3  . levalbuterol (XOPENEX) 1.25 MG/3ML nebulizer solution TAKE 1.25 MG BY NEBULIZATION AS NEEDED. USE IN NEBULIZER AS NEEDED 72 mL 0  . lisinopril-hydrochlorothiazide  (PRINZIDE,ZESTORETIC) 20-12.5 MG tablet Take 1 tablet by mouth daily. 90 tablet 3  . LORazepam (ATIVAN) 1 MG tablet Take 1/2 to 1 tablet by mouth daily PRN-- MUST HAVE OFFICE VISIT FOR REFILLS 30 tablet 0  . montelukast (SINGULAIR) 10 MG tablet TAKE 1 TABLET BY MOUTH  DAILY 90 tablet 3  . nebivolol (BYSTOLIC) 5 MG tablet Take 1 tablet (5 mg total) by mouth daily. 90 tablet 3  . nitroGLYCERIN (NITROSTAT) 0.4 MG SL tablet Place 1 tablet (0.4 mg total) under the tongue every 5 (five) minutes as needed. 75 tablet 0  . OVER THE COUNTER MEDICATION Take 1 capsule by mouth daily. Tumeric    . SYMBICORT 160-4.5 MCG/ACT inhaler INHALE 1 TO 2 PUFFS INTO  THE LUNGS EVERY 12 HOURS AS NEEDED 30.6 g 3  . vardenafil (LEVITRA) 20 MG tablet TAKE 1 TABLET (20 MG TOTAL) BY MOUTH AS DIRECTED. CAN NOT BE TAKENWITH NITROGLYCERIN. Need OV for future refills 6 tablet 0  . rosuvastatin (CRESTOR) 5 MG tablet Take 1 tablet (5 mg total) by mouth every Monday, Wednesday, and Friday. 45 tablet 3   No current facility-administered medications on file prior to visit.     Past Medical History:  Diagnosis Date  . Allergy   . Arthritis   . Asthma   . Blood transfusion without reported diagnosis   . CAD (coronary artery disease)   . Diabetes mellitus without complication ([place])   . GERD (gastroesophageal reflux disease)   . HTN (hypertension)   . Hyperlipidemia   . LBBB (left bundle branch block) 1999  . Seasonal allergies   . Sleep apnea     Past Surgical History:  Procedure Laterality Date  . CARDIAC CATHETERIZATION  2003   Dr Pernell Dupre; 85 % R circumflex obstruction  . CARPAL TUNNEL RELEASE Left 10/11/2014   Procedure: LEFT CARPAL TUNNEL RELEASE;  Surgeon: Roseanne Kaufman, MD;  Location: [facility];  Service: Orthopedics;  Laterality: Left;  . INGUINAL HERNIA REPAIR     right, age 89  . INGUINAL HERNIA REPAIR     left age 24  . LUMBAR LAMINECTOMY     age 89  . MINOR CARPAL TUNNEL Right 11/22/2014    Procedure: RIGHT LIMITED OPEN CARPAL TUNNEL RELEASE;  Surgeon: Roseanne Kaufman, MD;  Location: [facility];  Service: Orthopedics;  Laterality: Right;  . TONSILLECTOMY     age 42    Social History   Socioeconomic History  . Marital status: Married    Spouse name: Not on file  . Number of children: Not on file  . Years of education: Not on file  . Highest education level: Not on file  Occupational History  . Not on file  Social Needs  . Financial resource strain: Not on file  . Food insecurity:    Worry: Not on file    Inability: Not on file  . Transportation needs:    Medical: Not on file    Non-medical: Not on file  Tobacco Use  .  Smoking status: Former Smoker    Last attempt to quit: 12/14/1963    Years since quitting: 54.3  . Smokeless tobacco: Never Used  Substance and Sexual Activity  . Alcohol use: Yes    Alcohol/week: 1.2 oz    Types: 2 Glasses of wine per week    Comment: socially  . Drug use: No  . Sexual activity: Yes  Lifestyle  . Physical activity:    Days per week: Not on file    Minutes per session: Not on file  . Stress: Not on file  Relationships  . Social connections:    Talks on phone: Not on file    Gets together: Not on file    Attends religious service: Not on file    Active member of club or organization: Not on file    Attends meetings of clubs or organizations: Not on file    Relationship status: Not on file  Other Topics Concern  . Not on file  Social History Narrative  . Not on file    Family History  Problem Relation Age of Onset  . Heart attack Father        26s  . Colon polyps Father   . Heart failure Mother        90s  . Subarachnoid hemorrhage Mother 46  . Hypertension Mother   . Subarachnoid hemorrhage Paternal Grandfather 15  . Diabetes Maternal Aunt   . Colon cancer Neg Hx     Review of Systems  Constitutional: Negative for chills and fever.  HENT: Positive for postnasal drip.   Eyes: Negative for  visual disturbance.  Respiratory: Negative for cough, shortness of breath and wheezing.   Cardiovascular: Negative for chest pain, palpitations and leg swelling.  Neurological: Negative for light-headedness and headaches.  Psychiatric/Behavioral: Negative for sleep disturbance.       Objective:   Vitals:   03/27/18 0752  BP: 128/70  Pulse: 69  Resp: 16  Temp: 97.7 F (36.5 C)  SpO2: 97%   BP Readings from Last 3 Encounters:  03/27/18 128/70  03/07/18 132/80  08/29/17 132/84   Wt Readings from Last 3 Encounters:  03/27/18 196 lb (88.9 kg)  03/07/18 202 lb (91.6 kg)  09/14/17 185 lb (83.9 kg)   Body mass index is 28.94 kg/m.   Physical Exam    Constitutional: Appears well-developed and well-nourished. No distress.  HENT:  Head: Normocephalic and atraumatic.  Neck: Neck supple. No tracheal deviation present. No thyromegaly present.  No cervical lymphadenopathy Cardiovascular: Normal rate, regular rhythm and normal heart sounds.   No murmur heard. No carotid bruit .  No edema Pulmonary/Chest: Effort normal and breath sounds normal. No respiratory distress. No has no wheezes. No rales.  Skin: Skin is warm and dry. Not diaphoretic.  Psychiatric: Normal mood and affect. Behavior is normal.    Diabetic Foot Exam - Simple   Simple Foot Form Diabetic Foot exam was performed with the following findings:  Yes 03/27/2018  8:28 AM  Visual Inspection No deformities, no ulcerations, no other skin breakdown bilaterally:  Yes Sensation Testing Intact to touch and monofilament testing bilaterally:  Yes Pulse Check Posterior Tibialis and Dorsalis pulse intact bilaterally:  Yes Comments       Assessment & Plan:    See Problem List for Assessment and Plan of chronic medical problems.   FU 3 months

## 2018-03-27 ENCOUNTER — Other Ambulatory Visit (INDEPENDENT_AMBULATORY_CARE_PROVIDER_SITE_OTHER): Payer: Medicare Other

## 2018-03-27 ENCOUNTER — Encounter: Payer: Self-pay | Admitting: Internal Medicine

## 2018-03-27 ENCOUNTER — Encounter: Payer: Self-pay | Admitting: Emergency Medicine

## 2018-03-27 ENCOUNTER — Ambulatory Visit: Payer: Medicare Other | Admitting: Internal Medicine

## 2018-03-27 VITALS — BP 128/70 | HR 69 | Temp 97.7°F | Resp 16 | Ht 69.0 in | Wt 196.0 lb

## 2018-03-27 DIAGNOSIS — F419 Anxiety disorder, unspecified: Secondary | ICD-10-CM | POA: Diagnosis not present

## 2018-03-27 DIAGNOSIS — G8929 Other chronic pain: Secondary | ICD-10-CM

## 2018-03-27 DIAGNOSIS — M159 Polyosteoarthritis, unspecified: Secondary | ICD-10-CM

## 2018-03-27 DIAGNOSIS — I10 Essential (primary) hypertension: Secondary | ICD-10-CM

## 2018-03-27 DIAGNOSIS — J45909 Unspecified asthma, uncomplicated: Secondary | ICD-10-CM | POA: Diagnosis not present

## 2018-03-27 DIAGNOSIS — E1159 Type 2 diabetes mellitus with other circulatory complications: Secondary | ICD-10-CM

## 2018-03-27 LAB — LIPID PANEL
CHOL/HDL RATIO: 5
Cholesterol: 157 mg/dL (ref 0–200)
HDL: 29.8 mg/dL — AB (ref >39.00)
LDL Cholesterol: 103 mg/dL — ABNORMAL HIGH (ref 0–99)
NonHDL: 127.14
TRIGLYCERIDES: 122 mg/dL (ref 0.0–149.0)
VLDL: 24.4 mg/dL (ref 0.0–40.0)

## 2018-03-27 LAB — COMPREHENSIVE METABOLIC PANEL
ALBUMIN: 4 g/dL (ref 3.5–5.2)
ALT: 21 U/L (ref 0–53)
AST: 19 U/L (ref 0–37)
Alkaline Phosphatase: 74 U/L (ref 39–117)
BILIRUBIN TOTAL: 0.3 mg/dL (ref 0.2–1.2)
BUN: 30 mg/dL — ABNORMAL HIGH (ref 6–23)
CALCIUM: 9.1 mg/dL (ref 8.4–10.5)
CO2: 24 mEq/L (ref 19–32)
Chloride: 103 mEq/L (ref 96–112)
Creatinine, Ser: 1.23 mg/dL (ref 0.40–1.50)
GFR: 61.03 mL/min (ref >60.00)
Glucose, Bld: 122 mg/dL — ABNORMAL HIGH (ref 70–99)
Potassium: 4.9 mEq/L (ref 3.5–5.1)
Sodium: 137 mEq/L (ref 135–145)
Total Protein: 6.8 g/dL (ref 6.0–8.3)

## 2018-03-27 LAB — HEMOGLOBIN A1C: Hgb A1c MFr Bld: 7.1 % — ABNORMAL HIGH (ref 4.6–6.5)

## 2018-03-27 LAB — MICROALBUMIN / CREATININE URINE RATIO
CREATININE, U: 76.6 mg/dL
MICROALB/CREAT RATIO: 0.9 mg/g (ref 0.0–30.0)

## 2018-03-27 NOTE — Assessment & Plan Note (Signed)
BP well controlled Current regimen effective and well tolerated Continue current medications at current doses cmp  

## 2018-03-27 NOTE — Assessment & Plan Note (Addendum)
No recent exacerbations Continue current inhalers

## 2018-03-27 NOTE — Assessment & Plan Note (Addendum)
Not on medication No longer vegan Will check a1c Work on increasing exercise May need to restart metformin

## 2018-03-28 NOTE — Assessment & Plan Note (Signed)
Controlled.   -Continue current medication

## 2018-03-28 NOTE — Assessment & Plan Note (Signed)
Indication for chronic opioid: chronic arthritis-multiple joints Medication and dose: Norco 7.5-325mg  - takes  1 pill daily # pills per month: 30/ month maximum Last UDS date: 03/27/18 Pain contract signed (Y/N): Y, 04/12/17, 03/27/18 Date narcotic database last reviewed (include red flags): 03/27/18 no red flags-no evidence of abusing medication  Benefits of pain medication outweighs mild risk. Takes 1 pill a day and is taking medication appropriately Medication providing significant relief We will continue current medication at current dose Follow-up in 3 months

## 2018-03-28 NOTE — Assessment & Plan Note (Signed)
Arthritis of several joints Taking hydrocodone-acetaminophen 7.5-325 mg once daily, which improves pain and allows him to be functional at home Has been on medication for years and denies side effects Takes medication appropriately Overall pain controlled Will continue

## 2018-03-29 ENCOUNTER — Other Ambulatory Visit: Payer: Self-pay | Admitting: Emergency Medicine

## 2018-03-29 MED ORDER — METFORMIN HCL 500 MG PO TABS: 500.0000 mg | ORAL_TABLET | Freq: Every day | ORAL | 0 refills | Status: DC

## 2018-03-29 MED ORDER — METFORMIN HCL 500 MG PO TABS: 500.0000 mg | ORAL_TABLET | Freq: Every day | ORAL | 1 refills | Status: DC

## 2018-03-31 LAB — PAIN MGMT, PROFILE 8 W/CONF, U
6 Acetylmorphine: NEGATIVE ng/mL (ref <10)
ALCOHOL METABOLITES: NEGATIVE ng/mL (ref <500)
Alphahydroxyalprazolam: NEGATIVE ng/mL (ref <25)
Alphahydroxymidazolam: NEGATIVE ng/mL (ref <50)
Alphahydroxytriazolam: NEGATIVE ng/mL (ref <50)
Aminoclonazepam: NEGATIVE ng/mL (ref <25)
Amphetamines: NEGATIVE ng/mL (ref <500)
BUPRENORPHINE, URINE: NEGATIVE ng/mL (ref <5)
Benzodiazepines: POSITIVE ng/mL — AB (ref <100)
Cocaine Metabolite: NEGATIVE ng/mL (ref <150)
Codeine: NEGATIVE ng/mL (ref <50)
Creatinine: 75.6 mg/dL
HYDROXYETHYLFLURAZEPAM: NEGATIVE ng/mL (ref <50)
Hydrocodone: 327 ng/mL — ABNORMAL HIGH (ref <50)
Hydromorphone: 379 ng/mL — ABNORMAL HIGH (ref <50)
LORAZEPAM: 400 ng/mL — AB (ref <50)
MARIJUANA METABOLITE: NEGATIVE ng/mL (ref <20)
MDMA: NEGATIVE ng/mL (ref <500)
MORPHINE: NEGATIVE ng/mL (ref <50)
NORDIAZEPAM: NEGATIVE ng/mL (ref <50)
NORHYDROCODONE: 297 ng/mL — AB (ref <50)
OPIATES: POSITIVE ng/mL — AB (ref <100)
Oxazepam: NEGATIVE ng/mL (ref <50)
Oxidant: NEGATIVE ug/mL (ref <200)
Oxycodone: NEGATIVE ng/mL (ref <100)
Temazepam: NEGATIVE ng/mL (ref <50)
pH: 5.69 (ref 4.5–9.0)

## 2018-04-09 ENCOUNTER — Other Ambulatory Visit: Payer: Self-pay | Admitting: Internal Medicine

## 2018-04-10 MED ORDER — LORAZEPAM 1 MG PO TABS: ORAL_TABLET | ORAL | 0 refills | Status: DC

## 2018-04-10 NOTE — Telephone Encounter (Signed)
Check [place] registry last filled 03/08/2018.Marland KitchenJohny [name]

## 2018-05-08 ENCOUNTER — Other Ambulatory Visit: Payer: Self-pay | Admitting: Internal Medicine

## 2018-05-09 MED ORDER — LORAZEPAM 1 MG PO TABS: ORAL_TABLET | ORAL | 2 refills | Status: DC

## 2018-05-09 NOTE — Telephone Encounter (Signed)
[place] Controlled Substance Database checked. Last filled on 04/10/18

## 2018-05-28 ENCOUNTER — Other Ambulatory Visit: Payer: Self-pay | Admitting: Internal Medicine

## 2018-05-29 MED ORDER — HYDROCODONE-ACETAMINOPHEN 7.5-325 MG PO TABS: ORAL_TABLET | ORAL | 0 refills | Status: DC

## 2018-05-29 NOTE — Telephone Encounter (Signed)
Controlled Substance Database checked. Last filled on 03/13/18 

## 2018-06-14 ENCOUNTER — Other Ambulatory Visit: Payer: Self-pay | Admitting: Internal Medicine

## 2018-06-16 ENCOUNTER — Other Ambulatory Visit: Payer: Self-pay | Admitting: Internal Medicine

## 2018-06-22 ENCOUNTER — Ambulatory Visit: Payer: Medicare Other | Admitting: Physician Assistant

## 2018-06-22 VITALS — BP 105/65 | HR 57 | Ht 69.5 in | Wt 193.0 lb

## 2018-06-22 DIAGNOSIS — R0602 Shortness of breath: Secondary | ICD-10-CM | POA: Diagnosis not present

## 2018-06-22 DIAGNOSIS — I2510 Atherosclerotic heart disease of native coronary artery without angina pectoris: Secondary | ICD-10-CM

## 2018-06-22 DIAGNOSIS — I10 Essential (primary) hypertension: Secondary | ICD-10-CM

## 2018-06-22 DIAGNOSIS — I428 Other cardiomyopathies: Secondary | ICD-10-CM | POA: Diagnosis not present

## 2018-06-22 NOTE — Progress Notes (Signed)
Cardiology Office Note Date:  06/22/2018  Patient ID:  Jeffery [name], DOB 1941/01/24, MRN [id number] PCP:  [name], MD  Cardiologist:  Dr. Tamala Julian    Chief Complaint: DOE  History of Present Illness: Jeffery [name] is a 77 y.o. male with history of HTN, HLD, known LBBB, DM, OSA,, GERD, arthritis, chronic pain/opioid use, and non-obstructive CAD by cath in 2002, chronic CHF (mixed, systolic/diastolic).  He comes in today to be seen for Dr. Tamala Julian.  He last saw himin Sep 2018, at that visit c/o a few episodes of jaw/high throat discomfort that was associated with emotional upset, and referred for stress testing.  This was felt to be stable from prior testing and no changes were made.  The patient comes accompanied by his wife today.  He called to be seen 2/2 new DOE that he has had.  Since his last visit with Dr. Tamala Julian he has done and felt well without ongoing symptoms until last weekend.   He is typically very active always on the move, mentions has 2 small grandchildren that live with he and his wife, takes care of his yard.  He and his wife were walking home from a neighbor's 3 houses away.  This was an incline but a walk he has done numerous times without difficulty.  On Saturday he had to stop 3 times to catch his breath and his legs felt very heavy.  He did try a s/l NTG though it made no difference.  Once home he did a breathing treatment.  He did feel better but uncertain if the treatment or being at rest is what made the improvement.  The following day he felt well, did his usual activities plus got his RV packed up and ready [place] 2 [place] the [place].  All day he felt well.  The first day at the zoo he again felt unusually SOB with walking even flat surface, and need to pace himself and take breaks. The second day was not as noticeable but did not feel 100% either.  Today he has felt well without SOB.  He has not had any associated symptoms with the DOE or otherwise.  No CP,  jaw pain, no palpitations, mentions he checked his pulse with these events and was strong, regular and of normal rates.  He had no near syncope or syncope.  He does not feel bloated or like he is retaining any water, denies symptoms of PND or orthopnea, has been sleeping very well.  His wife mentions that last weekend there was an air quality alert and the weekend was hot , and these environments tend to bother his asthma.    Past Medical History:  Diagnosis Date  . Allergy   . Arthritis   . Asthma   . Blood transfusion without reported diagnosis   . CAD (coronary artery disease)   . Diabetes mellitus without complication ([place])   . GERD (gastroesophageal reflux disease)   . HTN (hypertension)   . Hyperlipidemia   . LBBB (left bundle branch block) 1999  . Seasonal allergies   . Sleep apnea     Past Surgical History:  Procedure Laterality Date  . CARDIAC CATHETERIZATION  2003   Dr Pernell Dupre; 85 % R circumflex obstruction  . CARPAL TUNNEL RELEASE Left 10/11/2014   Procedure: LEFT CARPAL TUNNEL RELEASE;  Surgeon: Roseanne Kaufman, MD;  Location: [facility];  Service: Orthopedics;  Laterality: Left;  . INGUINAL HERNIA REPAIR  right, age 67  . INGUINAL HERNIA REPAIR     left age 65  . LUMBAR LAMINECTOMY     age 67  . MINOR CARPAL TUNNEL Right 11/22/2014   Procedure: RIGHT LIMITED OPEN CARPAL TUNNEL RELEASE;  Surgeon: Roseanne Kaufman, MD;  Location: [facility];  Service: Orthopedics;  Laterality: Right;  . TONSILLECTOMY     age 57    Current Outpatient Medications  Medication Sig Dispense Refill  . ACCU-CHEK SOFTCLIX LANCETS lancets Use to test blood sugar daily as directed. 100 each 3  . albuterol (PROAIR HFA) 108 (90 BASE) MCG/ACT inhaler Inhale 2 puffs into the lungs as needed.      [name] aspirin 81 MG tablet Take 81 mg by mouth at bedtime.      . Blood Glucose Calibration (ACCU-CHEK AVIVA) SOLN Use as instructed to test blood sugar ICD 10 E11 51 1 each  3  . Blood Glucose Monitoring Suppl (ACCU-CHEK AVIVA PLUS) W/DEVICE KIT Use as directed 1 kit 0  . Coenzyme Q10 (COQ10) 100 MG CAPS Take 200 mg by mouth daily.      . fexofenadine (ALLEGRA) 180 MG tablet Take 180 mg by mouth daily as needed for allergies.     [name] glucose blood (ACCU-CHEK AVIVA PLUS) test strip Use as instructed to test blood sugar daily ICD 10 E11 51 100 each 3  . HYDROcodone-acetaminophen (NORCO) 7.5-325 MG tablet 1 q 6 hrs prn for chronic joint pain 70 tablet 0  . ibuprofen (ADVIL,MOTRIN) 800 MG tablet Take 800 mg by mouth every 8 (eight) hours as needed.    Elmore Guise Devices (AUTOLET IMPRESSION) MISC Use as instructed to test blood sugar ICD 10 E11 51 1 each 3  . levalbuterol (XOPENEX) 1.25 MG/3ML nebulizer solution TAKE 1.25 MG BY NEBULIZATION AS NEEDED. USE IN NEBULIZER AS NEEDED 72 mL 0  . lisinopril-hydrochlorothiazide (PRINZIDE,ZESTORETIC) 20-12.5 MG tablet Take 1 tablet by mouth daily. 90 tablet 3  . LORazepam (ATIVAN) 1 MG tablet Take 1/2 to 1 tablet by mouth daily PRN 30 tablet 2  . metFORMIN (GLUCOPHAGE) 500 MG tablet Take 1 tablet (500 mg total) by mouth daily with breakfast. 30 tablet 0  . montelukast (SINGULAIR) 10 MG tablet TAKE 1 TABLET BY MOUTH  DAILY 90 tablet 2  . nebivolol (BYSTOLIC) 5 MG tablet Take 1 tablet (5 mg total) by mouth daily. 90 tablet 3  . nitroGLYCERIN (NITROSTAT) 0.4 MG SL tablet Place 1 tablet (0.4 mg total) under the tongue every 5 (five) minutes as needed. 75 tablet 0  . OVER THE COUNTER MEDICATION Take 1 capsule by mouth daily. Tumeric    . rosuvastatin (CRESTOR) 5 MG tablet Take 1 tablet (5 mg total) by mouth every Monday, Wednesday, and Friday. 45 tablet 3  . SYMBICORT 160-4.5 MCG/ACT inhaler INHALE 1 TO 2 PUFFS INTO  THE LUNGS EVERY 12 HOURS AS NEEDED 30.6 g 3  . vardenafil (LEVITRA) 20 MG tablet TAKE 1 TABLET BY MOUTH AS DIRECTED. CAN NOT BE TAKEN WITH NITROGLYCERIN. 6 tablet 2   No current facility-administered medications for this visit.      Allergies:   Contrast media [iodinated diagnostic agents]   Social History:  The patient  reports that he quit smoking about 54 years ago. He has never used smokeless tobacco. He reports that he drinks about 1.2 oz of alcohol per week. He reports that he does not use drugs.   Family History:  The patient's family history includes Colon polyps in his  father; Diabetes in his maternal aunt; Heart attack in his father; Heart failure in his mother; Hypertension in his mother; Subarachnoid hemorrhage (age of onset: 33) in his mother; Subarachnoid hemorrhage (age of onset: 56) in his paternal grandfather.  ROS:  Please see the history of present illness.  All other systems are reviewed and otherwise negative.   PHYSICAL EXAM:  VS:  BP 105/65   Pulse (!) 57   Ht 5' 9.5" (1.765 m)   Wt 193 lb (87.5 kg)   BMI 28.09 kg/m  BMI: Body mass index is 28.09 kg/m. Well nourished, well developed, in no acute distress  HEENT: normocephalic, atraumatic  Neck: no JVD, carotid bruits or masses Cardiac:  RRR; no significant murmurs, no rubs, or gallops Lungs:  CTA b/l, no wheezing, rhonchi or rales  Abd: soft, nontender MS: no deformity or atrophy Ext: no edema  Skin: warm and dry, no rash Neuro:  No gross deficits appreciated Psych: euthymic mood, full affect    EKG:  Done today and reviewed by myself: SB 55bpm, LBBB  Cardiac cath performed in 2002 demonstrated moderate obtuse marginal disease involving 2 branches  09/14/17: stress myoview  Nuclear stress EF: 41%.  There was no ST segment deviation noted during stress.  The left ventricular ejection fraction is moderately decreased (30-44%).  Findings consistent with prior myocardial infarction.  This is an intermediate risk study.   1. EF 41%, inferior and septal hypokinesis.  2. Primarily fixed, large, severe basal inferolateral/inferior/inferoseptal, mid inferior/inferoseptal and apical inferior/septal perfusion defect.  Possible  prior MI with no significant ischemia (cannot fully rule out artifact from LBBB).   Intermediate risk study.    01/27/15: TTE Study Conclusions - Left ventricle: The cavity size was normal. Wall thickness was normal. Systolic function was mildly to moderately reduced. The estimated ejection fraction was in the range of 40% to 45%. Although no diagnostic regional wall motion abnormality was identified, this possibility cannot be completely excluded on the basis of this study. Features are consistent with a pseudonormal left ventricular filling pattern, with concomitant abnormal relaxation and increased filling pressure (grade 2 diastolic dysfunction). - Ventricular septum: Septal motion showed abnormal function, dyssynergy, and paradox. These changes are consistent with a left bundle branch block. - Left atrium: The atrium was mildly dilated.    Recent Labs: 08/29/2017: Hemoglobin 14.5; Platelets 344 03/27/2018: ALT 21; BUN 30; Creatinine, Ser 1.23; Potassium 4.9; Sodium 137  03/27/2018: Cholesterol 157; HDL 29.80; LDL Cholesterol 103; Total CHOL/HDL Ratio 5; Triglycerides 122.0; VLDL 24.4   CrCl cannot be calculated (Patient's most recent lab result is older than the maximum 21 days allowed.).   Wt Readings from Last 3 Encounters:  06/22/18 193 lb (87.5 kg)  03/27/18 196 lb (88.9 kg)  03/07/18 202 lb (91.6 kg)     Other studies reviewed: Additional studies/records reviewed today include: summarized above  ASSESSMENT AND PLAN:  1. CAD (mod OM branch disease by cath in 2002), known LBBB     On ASA, BB, and statin tx       New DOE as described Abnormal stress test 2018, Dr. Tamala Julian felt stable from prior with no changes made to tx  2005 stress test describes large area of diminished uptake in IW, inferoseptum and apex.  Anterior and lateral wall uptake appears normal Rest images reveal persistent large inferobasal, inferoseptal and inferoapical defect, EF  45% no ischemia Report mentions this was similar to 2002  2002 cath noted  LM short and normal LAD  no significant obstruction in LAD or it's branches (20% ostial lesion in RAO view) Cx mod sized gives off 2 marginals 1st OM 50-70% stenosis just distal to bifurcation 2nd OM eccentric 30% just beyond bifurcation from the cx RCA dominant with luminal irregularities      I have reviewed the case with DOD, Dr. Saunders Revel.  Recommends getting an echo done, if there is significant change in EF would consider cath, and to have f/u with Dr. Tamala Julian in a couple weeks.  The patient has not had any rest symptoms, he is instructed to avoid provoking activities, or unnecessarily exertional activities.  He has s/l NTG if needed. And is instructed that should he develop any rest symptoms or have any escalation in his symptoms to seek attention.     2. HTN     Looks OK, no changes  3. NICM     Exam does not suggest fluid OL, weight is down a couple pounds     No rest SOB, no PND or orthopnea     On BB/ACE, diuretic    Disposition: F/u with echo and Dr. Tamala Julian as noted above     Current medicines are reviewed at length with the patient today.  The patient did not have any concerns regarding medicines.  Venetia Night, PA-C 06/22/2018 4:30 PM     [address] [place] [address] [place] [address] [phone] (office)  [phone] (fax)

## 2018-06-22 NOTE — Patient Instructions (Addendum)
Medication Instructions:   Your physician recommends that you continue on your current medications as directed. Please refer to the Current Medication list given to you today.   If you need a refill on your cardiac medications before your next appointment, please call your pharmacy.   Labwork:  NONE ORDERED  TODAY    Testing/Procedures Your physician has requested that you have an echocardiogram. Echocardiography is a painless test that uses sound waves to create images of your heart. It provides your doctor with information about the size and shape of your heart and how well your heart's chambers and valves are working. This procedure takes approximately one hour. There are no restrictions for this procedure.   Follow-Up: 2 WEEKS WITH DR Tamala Julian  ( CONTACT NURSE) PER DOD (END)   Any Other Special Instructions Will Be Listed Below (If Applicable).

## 2018-06-23 ENCOUNTER — Other Ambulatory Visit: Payer: Self-pay

## 2018-06-23 ENCOUNTER — Ambulatory Visit (HOSPITAL_COMMUNITY): Payer: Medicare Other | Attending: Cardiology

## 2018-06-23 DIAGNOSIS — G4730 Sleep apnea, unspecified: Secondary | ICD-10-CM | POA: Insufficient documentation

## 2018-06-23 DIAGNOSIS — I447 Left bundle-branch block, unspecified: Secondary | ICD-10-CM | POA: Diagnosis not present

## 2018-06-23 DIAGNOSIS — R0602 Shortness of breath: Secondary | ICD-10-CM

## 2018-06-23 DIAGNOSIS — I10 Essential (primary) hypertension: Secondary | ICD-10-CM | POA: Insufficient documentation

## 2018-06-23 DIAGNOSIS — E119 Type 2 diabetes mellitus without complications: Secondary | ICD-10-CM | POA: Diagnosis not present

## 2018-06-23 DIAGNOSIS — E785 Hyperlipidemia, unspecified: Secondary | ICD-10-CM | POA: Insufficient documentation

## 2018-06-23 DIAGNOSIS — I2510 Atherosclerotic heart disease of native coronary artery without angina pectoris: Secondary | ICD-10-CM | POA: Insufficient documentation

## 2018-06-23 DIAGNOSIS — J45909 Unspecified asthma, uncomplicated: Secondary | ICD-10-CM | POA: Insufficient documentation

## 2018-06-23 NOTE — Addendum Note (Signed)
Addended by: Claude Manges on: 06/23/2018 04:21 PM   Modules accepted: Orders

## 2018-06-24 ENCOUNTER — Encounter: Payer: Self-pay | Admitting: Interventional Cardiology

## 2018-06-24 DIAGNOSIS — R001 Bradycardia, unspecified: Secondary | ICD-10-CM

## 2018-06-27 ENCOUNTER — Other Ambulatory Visit: Payer: Self-pay | Admitting: Interventional Cardiology

## 2018-06-27 ENCOUNTER — Telehealth: Payer: Self-pay | Admitting: Physician Assistant

## 2018-06-27 NOTE — Telephone Encounter (Signed)
He needs a 48 hour Holter to be worn with him physically active to determine if there is appropriate HR response to activity.

## 2018-06-27 NOTE — Telephone Encounter (Signed)
Called patient to f/u on symptoms and echo result.  He reports feeling somewhat improved though clearly remains with diminished exertional capacity then his baseline from event 2 weeks ago.  He was able to mow his lawn and take care of his yard this weekend though taking breaks/and pacing himself.  Feels like he gets winded, no CP, no palpitations, no dizziness, near suyncope or syncope.  He did happen to notice when he took his pulse manually he was getting high 40's-low 50's with his yard work, and his fit bit said  110bpm.  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  He denies and fever or symptoms of illness, no rest SOB, no PND or orthopnea.  I will touch base with Dr. Tamala Julian with his echo report, perhaps arrange holter monitoring prior to his appoint with him.  Tommye Standard, PA-C

## 2018-06-29 ENCOUNTER — Ambulatory Visit (INDEPENDENT_AMBULATORY_CARE_PROVIDER_SITE_OTHER): Payer: Medicare Other

## 2018-06-29 DIAGNOSIS — R002 Palpitations: Secondary | ICD-10-CM | POA: Diagnosis not present

## 2018-06-29 DIAGNOSIS — R000 Tachycardia, unspecified: Secondary | ICD-10-CM | POA: Diagnosis not present

## 2018-06-29 DIAGNOSIS — R001 Bradycardia, unspecified: Secondary | ICD-10-CM | POA: Diagnosis not present

## 2018-07-02 NOTE — Progress Notes (Signed)
Cardiology Office Note:    Date:  07/05/2018   ID:  Jeffery [name], DOB 03/16/1941, MRN [id number]  PCP:  [name], MD  Cardiologist:  No primary care provider on file.   Referring MD: [name], MD   Chief Complaint  Patient presents with  . Coronary Artery Disease  . Shortness of Breath    History of Present Illness:    Jeffery [name] is a 77 y.o. male with a hx of left bundle branch block, hypertension, hyperlipidemia, and encouragement concerning management of obstructive sleep apnea, diabetes mellitus and obesity..  Ms. Jeffery [name] master has been experiencing exertional exertional dyspnea and extreme fatigue for the past 3 weeks.  It happens suddenly when he was walking.  Since that time he has not been able to do much walking or physical activity without developing severe shortness of breath and fatigue.  As long as he is sitting and taking it easy he has no problems.  He has not had chest pain.  A 48-hour Holter was performed and demonstrated a heart rate range between 39 and 95 bpm.  Early one morning when he was awake there was an episode of second-degree heart block.  The patient had no symptoms.  He has history of left bundle branch block for greater than 15 years.   Past Medical History:  Diagnosis Date  . Allergy   . Arthritis   . Asthma   . Blood transfusion without reported diagnosis   . CAD (coronary artery disease)   . Diabetes mellitus without complication ([place])   . GERD (gastroesophageal reflux disease)   . HTN (hypertension)   . Hyperlipidemia   . LBBB (left bundle branch block) 1999  . Seasonal allergies   . Sleep apnea     Past Surgical History:  Procedure Laterality Date  . CARDIAC CATHETERIZATION  2003   Dr Pernell Dupre; 85 % R circumflex obstruction  . CARPAL TUNNEL RELEASE Left 10/11/2014   Procedure: LEFT CARPAL TUNNEL RELEASE;  Surgeon: Roseanne Kaufman, MD;  Location: [facility];  Service: Orthopedics;  Laterality:  Left;  . INGUINAL HERNIA REPAIR     right, age 89  . INGUINAL HERNIA REPAIR     left age 52  . LUMBAR LAMINECTOMY     age 77  . MINOR CARPAL TUNNEL Right 11/22/2014   Procedure: RIGHT LIMITED OPEN CARPAL TUNNEL RELEASE;  Surgeon: Roseanne Kaufman, MD;  Location: [facility];  Service: Orthopedics;  Laterality: Right;  . TONSILLECTOMY     age 31    Current Medications: Current Meds  Medication Sig  . ACCU-CHEK SOFTCLIX LANCETS lancets Use to test blood sugar daily as directed.  [name] albuterol (PROAIR HFA) 108 (90 BASE) MCG/ACT inhaler Inhale 2 puffs into the lungs every 6 (six) hours as needed for wheezing or shortness of breath.   [name] aspirin 81 MG tablet Take 81 mg by mouth at bedtime.    . Blood Glucose Calibration (ACCU-CHEK AVIVA) SOLN Use as instructed to test blood sugar ICD 10 E11 51  . Blood Glucose Monitoring Suppl (ACCU-CHEK AVIVA PLUS) W/DEVICE KIT Use as directed  . budesonide-formoterol (SYMBICORT) 160-4.5 MCG/ACT inhaler Inhale 2 puffs into the lungs 2 (two) times daily as needed (wheezing/shortness of breath).  . fexofenadine (ALLEGRA) 180 MG tablet Take 180 mg by mouth daily as needed for allergies.   [name] glucose blood (ACCU-CHEK AVIVA PLUS) test strip Use as instructed to test blood sugar daily ICD 10 E11 51  .  HYDROcodone-acetaminophen (NORCO) 7.5-325 MG tablet Take 1 tablet by mouth every 6 (six) hours as needed (for chronic joint pain).  [name] ibuprofen (ADVIL,MOTRIN) 800 MG tablet Take 800 mg by mouth every 8 (eight) hours as needed for moderate pain.   [employer] Devices (AUTOLET IMPRESSION) MISC Use as instructed to test blood sugar ICD 10 E11 51  . levalbuterol (XOPENEX) 1.25 MG/3ML nebulizer solution Take 1.25 mg by nebulization every 4 (four) hours as needed for wheezing or shortness of breath.  . lisinopril-hydrochlorothiazide (PRINZIDE,ZESTORETIC) 20-12.5 MG tablet Take 1 tablet by mouth daily.  [name] LORazepam (ATIVAN) 1 MG tablet Take 0.5-1 mg by mouth at bedtime  as needed for sleep.  . metFORMIN (GLUCOPHAGE) 500 MG tablet Take 1 tablet (500 mg total) by mouth daily with breakfast.  . montelukast (SINGULAIR) 10 MG tablet TAKE 1 TABLET BY MOUTH  DAILY  . nitroGLYCERIN (NITROSTAT) 0.4 MG SL tablet Place 0.4 mg under the tongue every 5 (five) minutes x 3 doses as needed for chest pain.  . rosuvastatin (CRESTOR) 5 MG tablet Take 5 mg by mouth every Monday, Wednesday, and Friday.  . vardenafil (LEVITRA) 20 MG tablet TAKE 1 TABLET BY MOUTH AS DIRECTED. CAN NOT BE TAKEN WITH NITROGLYCERIN.  . [DISCONTINUED] nebivolol (BYSTOLIC) 5 MG tablet Take 1 tablet (5 mg total) by mouth daily.     Allergies:   Contrast media [iodinated diagnostic agents]   Social History   Socioeconomic History  . Marital status: Married    Spouse name: Not on file  . Number of children: Not on file  . Years of education: Not on file  . Highest education level: Not on file  Occupational History  . Not on file  Social Needs  . Financial resource strain: Not on file  . Food insecurity:    Worry: Not on file    Inability: Not on file  . Transportation needs:    Medical: Not on file    Non-medical: Not on file  Tobacco Use  . Smoking status: Former Smoker    Last attempt to quit: 12/14/1963    Years since quitting: 54.5  . Smokeless tobacco: Never Used  Substance and Sexual Activity  . Alcohol use: Yes    Alcohol/week: 1.2 oz    Types: 2 Glasses of wine per week    Comment: socially  . Drug use: No  . Sexual activity: Yes  Lifestyle  . Physical activity:    Days per week: Not on file    Minutes per session: Not on file  . Stress: Not on file  Relationships  . Social connections:    Talks on phone: Not on file    Gets together: Not on file    Attends religious service: Not on file    Active member of club or organization: Not on file    Attends meetings of clubs or organizations: Not on file    Relationship status: Not on file  Other Topics Concern  . Not on file   Social History Narrative  . Not on file     Family History: The patient's family history includes Colon polyps in his father; Diabetes in his maternal aunt; Heart attack in his father; Heart failure in his mother; Hypertension in his mother; Subarachnoid hemorrhage (age of onset: 8) in his mother; Subarachnoid hemorrhage (age of onset: 72) in his paternal grandfather. There is no history of Colon cancer.  ROS:   Please see the history of present illness.  No complaints other than as noted above.  All other systems reviewed and are negative.  EKGs/Labs/Other Studies Reviewed:    The following studies were reviewed today: 2D Doppler echocardiogram 06/23/2018: ------------------------------------------------------------------- Study Conclusions  - Left ventricle: The cavity size was mildly dilated. Systolic   function was mildly to moderately reduced. The estimated ejection   fraction was in the range of 40% to 45%. Although no diagnostic   regional wall motion abnormality was identified, this possibility   cannot be completely excluded on the basis of this study.   Features are consistent with a pseudonormal left ventricular   filling pattern, with concomitant abnormal relaxation and   increased filling pressure (grade 2 diastolic dysfunction). - Ventricular septum: Septal motion showed abnormal function and   dyssynergy. These changes are consistent with a left bundle   branch block. - Aortic valve: Trileaflet; normal thickness leaflets.   Transvalvular velocity was within the normal range. There was no   stenosis. There was no regurgitation. - Mitral valve: Transvalvular velocity was within the normal range.   There was no evidence for stenosis. There was no regurgitation. - Right ventricle: The cavity size was moderately dilated. Wall   thickness was normal. Systolic function was mildly reduced. - Atrial septum: No defect or patent foramen ovale was identified. - Tricuspid  valve: There was trivial regurgitation. - Pulmonic valve: There was no significant regurgitation.  Impressions:  - Moderately reduced systolic LV function. Septal motion consistent   with dyssynchrony. No significant change from prior.  Myocardial perfusion imaging 09/2017: Study Highlights     Nuclear stress EF: 41%.  There was no ST segment deviation noted during stress.  The left ventricular ejection fraction is moderately decreased (30-44%).  Findings consistent with prior myocardial infarction.  This is an intermediate risk study.   1. EF 41%, inferior and septal hypokinesis.  2. Primarily fixed, large, severe basal inferolateral/inferior/inferoseptal, mid inferior/inferoseptal and apical inferior/septal perfusion defect.  Possible prior MI with no significant ischemia (cannot fully rule out artifact from LBBB).       EKG:  EKG is not ordered today.  Historical chronic left bundle branch block.  Recent Labs: 08/29/2017: Hemoglobin 14.5; Platelets 344 03/27/2018: ALT 21; BUN 30; Creatinine, Ser 1.23; Potassium 4.9; Sodium 137  Recent Lipid Panel    Component Value Date/Time   CHOL 157 03/27/2018 0840   CHOL 213 (H) 08/29/2017 1122   CHOL 143 08/20/2014 0750   TRIG 122.0 03/27/2018 0840   TRIG 145 08/20/2014 0750   HDL 29.80 (L) 03/27/2018 0840   HDL 41 08/29/2017 1122   HDL 33 (L) 08/20/2014 0750   CHOLHDL 5 03/27/2018 0840   VLDL 24.4 03/27/2018 0840   LDLCALC 103 (H) 03/27/2018 0840   LDLCALC 139 (H) 08/29/2017 1122   LDLCALC 81 08/20/2014 0750    Physical Exam:    VS:  BP 124/76   Pulse 62   Ht 5' 9"  (1.753 m)   Wt 193 lb (87.5 kg)   BMI 28.50 kg/m     Wt Readings from Last 3 Encounters:  07/05/18 193 lb (87.5 kg)  06/22/18 193 lb (87.5 kg)  03/27/18 196 lb (88.9 kg)     GEN:  Well nourished, well developed in no acute distress HEENT: Normal NECK: No JVD. LYMPHATICS: No lymphadenopathy CARDIAC: RRR, no murmur, no gallop, no  edema. VASCULAR: 2+ carotid, radial, and posterior tibial pulses.  No carotid bruits. RESPIRATORY:  Clear to auscultation without rales, wheezing or rhonchi  ABDOMEN:  Soft, non-tender, non-distended, No pulsatile mass, MUSCULOSKELETAL: No deformity  SKIN: Warm and dry NEUROLOGIC:  Alert and oriented x 3 PSYCHIATRIC:  Normal affect   ASSESSMENT:    1. Fatigue, unspecified type   2. Chronic combined systolic and diastolic CHF (congestive heart failure) (HCC)   3. Atherosclerosis of native coronary artery of native heart with stable angina pectoris ([place])   4. Essential hypertension   5. Left bundle branch block   6. OSA (obstructive sleep apnea)    PLAN:    In order of problems listed above:  1. I am concerned that [name] master has chronotropic incompetence and also the possibility of exercise-induced AV block.  The Holter monitor demonstrated second-degree AV block that was asymptomatic for brief period.  Discontinue by systolic.  5 to 7 days later he needs an exercise treadmill test to evaluate chronotropic competence.  If present or if exercise-induced AV block he will need to have pacemaker therapy.   Medication Adjustments/Labs and Tests Ordered: Current medicines are reviewed at length with the patient today.  Concerns regarding medicines are outlined above.  Orders Placed This Encounter  Procedures  . Exercise Tolerance Test   No orders of the defined types were placed in this encounter.   Patient Instructions  Medication Instructions:  1) Discontinue Bystolic  Labwork: None  Testing/Procedures: Your physician has requested that you have an exercise tolerance test. For further information please visit [email or web address]. Please also follow instruction sheet, as given.   Follow-Up: Your physician wants you to follow-up in: 6 months with Dr. Tamala Julian.  You will receive a reminder letter in the mail two months in advance. If you don't receive a letter, please call  our office to schedule the follow-up appointment.   Any Other Special Instructions Will Be Listed Below (If Applicable).     If you need a refill on your cardiac medications before your next appointment, please call your pharmacy.  3    Signed, Sinclair Grooms, MD  07/05/2018 3:57 PM    [place]

## 2018-07-05 ENCOUNTER — Encounter: Payer: Self-pay | Admitting: Interventional Cardiology

## 2018-07-05 ENCOUNTER — Ambulatory Visit: Payer: Medicare Other | Admitting: Interventional Cardiology

## 2018-07-05 VITALS — BP 124/76 | HR 62 | Ht 69.0 in | Wt 193.0 lb

## 2018-07-05 DIAGNOSIS — I25118 Atherosclerotic heart disease of native coronary artery with other forms of angina pectoris: Secondary | ICD-10-CM | POA: Diagnosis not present

## 2018-07-05 DIAGNOSIS — R5383 Other fatigue: Secondary | ICD-10-CM | POA: Diagnosis not present

## 2018-07-05 DIAGNOSIS — I10 Essential (primary) hypertension: Secondary | ICD-10-CM | POA: Diagnosis not present

## 2018-07-05 DIAGNOSIS — I447 Left bundle-branch block, unspecified: Secondary | ICD-10-CM | POA: Diagnosis not present

## 2018-07-05 DIAGNOSIS — G4733 Obstructive sleep apnea (adult) (pediatric): Secondary | ICD-10-CM

## 2018-07-05 DIAGNOSIS — I5042 Chronic combined systolic (congestive) and diastolic (congestive) heart failure: Secondary | ICD-10-CM | POA: Diagnosis not present

## 2018-07-05 NOTE — Patient Instructions (Signed)
Medication Instructions:  1) Discontinue Bystolic  Labwork: None  Testing/Procedures: Your physician has requested that you have an exercise tolerance test. For further information please visit [email or web address]. Please also follow instruction sheet, as given.   Follow-Up: Your physician wants you to follow-up in: 6 months with Dr. Tamala Julian.  You will receive a reminder letter in the mail two months in advance. If you don't receive a letter, please call our office to schedule the follow-up appointment.   Any Other Special Instructions Will Be Listed Below (If Applicable).     If you need a refill on your cardiac medications before your next appointment, please call your pharmacy.  3

## 2018-07-11 ENCOUNTER — Ambulatory Visit (INDEPENDENT_AMBULATORY_CARE_PROVIDER_SITE_OTHER): Payer: Medicare Other

## 2018-07-11 DIAGNOSIS — I447 Left bundle-branch block, unspecified: Secondary | ICD-10-CM | POA: Diagnosis not present

## 2018-07-11 DIAGNOSIS — I5042 Chronic combined systolic (congestive) and diastolic (congestive) heart failure: Secondary | ICD-10-CM

## 2018-07-11 DIAGNOSIS — I25118 Atherosclerotic heart disease of native coronary artery with other forms of angina pectoris: Secondary | ICD-10-CM | POA: Diagnosis not present

## 2018-07-11 LAB — EXERCISE TOLERANCE TEST
CHL CUP MPHR: 146 {beats}/min
CHL CUP RESTING HR STRESS: 66 {beats}/min
CHL RATE OF PERCEIVED EXERTION: 14
CSEPEDS: 45 s
CSEPHR: 60 %
Estimated workload: 4.1 METS
Exercise duration (min): 1 min
Peak HR: 88 {beats}/min

## 2018-07-13 ENCOUNTER — Ambulatory Visit: Payer: Medicare Other | Admitting: Internal Medicine

## 2018-07-13 ENCOUNTER — Encounter: Payer: Self-pay | Admitting: Internal Medicine

## 2018-07-13 VITALS — BP 120/68 | HR 73 | Ht 69.0 in | Wt 192.2 lb

## 2018-07-13 DIAGNOSIS — I25118 Atherosclerotic heart disease of native coronary artery with other forms of angina pectoris: Secondary | ICD-10-CM

## 2018-07-13 DIAGNOSIS — I441 Atrioventricular block, second degree: Secondary | ICD-10-CM

## 2018-07-13 LAB — CBC WITH DIFFERENTIAL/PLATELET
BASOS: 0 %
Basophils Absolute: 0 10*3/uL (ref 0.0–0.2)
EOS (ABSOLUTE): 0.2 10*3/uL (ref 0.0–0.4)
Eos: 3 %
HEMATOCRIT: 43.1 % (ref 37.5–51.0)
Hemoglobin: 14.4 g/dL (ref 13.0–17.7)
Immature Grans (Abs): 0 10*3/uL (ref 0.0–0.1)
Immature Granulocytes: 1 %
LYMPHS ABS: 0.9 10*3/uL (ref 0.7–3.1)
Lymphs: 15 %
MCH: 28.7 pg (ref 26.6–33.0)
MCHC: 33.4 g/dL (ref 31.5–35.7)
MCV: 86 fL (ref 79–97)
MONOS ABS: 0.7 10*3/uL (ref 0.1–0.9)
Monocytes: 10 %
NEUTROS ABS: 4.5 10*3/uL (ref 1.4–7.0)
NEUTROS PCT: 71 %
PLATELETS: 298 10*3/uL (ref 150–450)
RBC: 5.02 x10E6/uL (ref 4.14–5.80)
RDW: 14.7 % (ref 12.3–15.4)
WBC: 6.3 10*3/uL (ref 3.4–10.8)

## 2018-07-13 LAB — BASIC METABOLIC PANEL
BUN / CREAT RATIO: 18 (ref 10–24)
BUN: 19 mg/dL (ref 8–27)
CO2: 27 mmol/L (ref 20–29)
Calcium: 9.9 mg/dL (ref 8.6–10.2)
Chloride: 97 mmol/L (ref 96–106)
Creatinine, Ser: 1.07 mg/dL (ref 0.76–1.27)
GFR calc Af Amer: 79 mL/min/{1.73_m2} (ref >59)
GFR calc non Af Amer: 68 mL/min/{1.73_m2} (ref >59)
GLUCOSE: 119 mg/dL — AB (ref 65–99)
POTASSIUM: 4.8 mmol/L (ref 3.5–5.2)
SODIUM: 138 mmol/L (ref 134–144)

## 2018-07-13 NOTE — Addendum Note (Signed)
Addended by: Harland German A on: 07/13/2018 10:16 AM   Modules accepted: Orders

## 2018-07-13 NOTE — H&P (View-Only) (Signed)
HPI Jeffery [name] is referred today by Dr. Tamala Julian for evaluation of exercise induced heart block. He is a pleasant 77 yo man with known LBBB, CAD by cath 17 years ago, fixed defect on stress imaging 8 months ago, who has developed increasing sob and was found on treadmill testing to have exercise induced 2:1 AV block. He has never had syncope. He states that his last heart cath was 17 years ago and he had single vessel disease. He does not have angina.   Allergies  Allergen Reactions  . Contrast Media [Iodinated Diagnostic Agents]     Redness and warm sensation      Current Outpatient Medications  Medication Sig Dispense Refill  . ACCU-CHEK SOFTCLIX LANCETS lancets Use to test blood sugar daily as directed. 100 each 3  . albuterol (PROAIR HFA) 108 (90 BASE) MCG/ACT inhaler Inhale 2 puffs into the lungs every 6 (six) hours as needed for wheezing or shortness of breath.     [name] aspirin 81 MG tablet Take 81 mg by mouth at bedtime.      . Blood Glucose Calibration (ACCU-CHEK AVIVA) SOLN Use as instructed to test blood sugar ICD 10 E11 51 1 each 3  . Blood Glucose Monitoring Suppl (ACCU-CHEK AVIVA PLUS) W/DEVICE KIT Use as directed 1 kit 0  . budesonide-formoterol (SYMBICORT) 160-4.5 MCG/ACT inhaler Inhale 2 puffs into the lungs 2 (two) times daily as needed (wheezing/shortness of breath).    . fexofenadine (ALLEGRA) 180 MG tablet Take 180 mg by mouth daily as needed for allergies.     [name] glucose blood (ACCU-CHEK AVIVA PLUS) test strip Use as instructed to test blood sugar daily ICD 10 E11 51 100 each 3  . HYDROcodone-acetaminophen (NORCO) 7.5-325 MG tablet Take 1 tablet by mouth every 6 (six) hours as needed (for chronic joint pain).    [name] ibuprofen (ADVIL,MOTRIN) 800 MG tablet Take 800 mg by mouth every 8 (eight) hours as needed for moderate pain.     [employer] Gu[employer] Devices (AUTOLET IMPRESSION) MISC Use as instructed to test blood sugar ICD 10 E11 51 1 each 3  . levalbuterol (XOPENEX) 1.25 MG/3ML  nebulizer solution Take 1.25 mg by nebulization every 4 (four) hours as needed for wheezing or shortness of breath.    . lisinopril-hydrochlorothiazide (PRINZIDE,ZESTORETIC) 20-12.5 MG tablet Take 1 tablet by mouth daily. 90 tablet 3  . LORazepam (ATIVAN) 1 MG tablet Take 0.5-1 mg by mouth at bedtime as needed for sleep.    . metFORMIN (GLUCOPHAGE) 500 MG tablet Take 1 tablet (500 mg total) by mouth daily with breakfast. 30 tablet 0  . montelukast (SINGULAIR) 10 MG tablet TAKE 1 TABLET BY MOUTH  DAILY 90 tablet 2  . nitroGLYCERIN (NITROSTAT) 0.4 MG SL tablet Place 0.4 mg under the tongue every 5 (five) minutes x 3 doses as needed for chest pain.    . rosuvastatin (CRESTOR) 5 MG tablet Take 5 mg by mouth every Monday, Wednesday, and Friday.    . vardenafil (LEVITRA) 20 MG tablet TAKE 1 TABLET BY MOUTH AS DIRECTED. CAN NOT BE TAKEN WITH NITROGLYCERIN. 6 tablet 2   No current facility-administered medications for this visit.      Past Medical History:  Diagnosis Date  . Allergy   . Arthritis   . Asthma   . Blood transfusion without reported diagnosis   . CAD (coronary artery disease)   . Diabetes mellitus without complication ([place])   . GERD (gastroesophageal reflux disease)   .  HTN (hypertension)   . Hyperlipidemia   . LBBB (left bundle branch block) 1999  . Seasonal allergies   . Sleep apnea     ROS:   All systems reviewed and negative except as noted in the HPI.   Past Surgical History:  Procedure Laterality Date  . CARDIAC CATHETERIZATION  2003   Dr Pernell Dupre; 85 % R circumflex obstruction  . CARPAL TUNNEL RELEASE Left 10/11/2014   Procedure: LEFT CARPAL TUNNEL RELEASE;  Surgeon: Roseanne Kaufman, MD;  Location: [facility];  Service: Orthopedics;  Laterality: Left;  . INGUINAL HERNIA REPAIR     right, age 52  . INGUINAL HERNIA REPAIR     left age 40  . LUMBAR LAMINECTOMY     age 34  . MINOR CARPAL TUNNEL Right 11/22/2014   Procedure: RIGHT LIMITED OPEN  CARPAL TUNNEL RELEASE;  Surgeon: Roseanne Kaufman, MD;  Location: [facility];  Service: Orthopedics;  Laterality: Right;  . TONSILLECTOMY     age 89     Family History  Problem Relation Age of Onset  . Heart attack Father        75s  . Colon polyps Father   . Heart failure Mother        90s  . Subarachnoid hemorrhage Mother 50  . Hypertension Mother   . Subarachnoid hemorrhage Paternal Grandfather 49  . Diabetes Maternal Aunt   . Colon cancer Neg Hx      Social History   Socioeconomic History  . Marital status: Married    Spouse name: Not on file  . Number of children: Not on file  . Years of education: Not on file  . Highest education level: Not on file  Occupational History  . Not on file  Social Needs  . Financial resource strain: Not on file  . Food insecurity:    Worry: Not on file    Inability: Not on file  . Transportation needs:    Medical: Not on file    Non-medical: Not on file  Tobacco Use  . Smoking status: Former Smoker    Last attempt to quit: 12/14/1963    Years since quitting: 54.6  . Smokeless tobacco: Never Used  Substance and Sexual Activity  . Alcohol use: Yes    Alcohol/week: 1.2 oz    Types: 2 Glasses of wine per week    Comment: socially  . Drug use: No  . Sexual activity: Yes  Lifestyle  . Physical activity:    Days per week: Not on file    Minutes per session: Not on file  . Stress: Not on file  Relationships  . Social connections:    Talks on phone: Not on file    Gets together: Not on file    Attends religious service: Not on file    Active member of club or organization: Not on file    Attends meetings of clubs or organizations: Not on file    Relationship status: Not on file  . Intimate partner violence:    Fear of current or ex partner: Not on file    Emotionally abused: Not on file    Physically abused: Not on file    Forced sexual activity: Not on file  Other Topics Concern  . Not on file  Social History  Narrative  . Not on file     BP 120/68   Pulse 73   Ht 5' 9" (1.753 m)   Wt 192 lb  3.2 oz (87.2 kg)   SpO2 98%   BMI 28.38 kg/m   Physical Exam:  Well appearing 76 yo man, NAD HEENT: Unremarkable Neck:  6 cm JVD, no thyromegally Lymphatics:  No adenopathy Back:  No CVA tenderness Lungs:  Clear with no wheezes HEART:  Regular rate rhythm, no murmurs, no rubs, no clicks Abd:  soft, positive bowel sounds, no organomegally, no rebound, no guarding Ext:  2 plus pulses, no edema, no cyanosis, no clubbing Skin:  No rashes no nodules Neuro:  CN II through XII intact, motor grossly intact  EKG - nsr with LBBB  Assess/Plan: 1. Exercise induced AV block - I have discussed the risks/benefits/goals/expectations of BiV PPM insertion with the patient and he wishes to proceed. 2. CAD - I am concerned that he might need a heart cath. I will discuss this more with Dr. Smith. 3. HTN - his blood pressure is well controlled. He is off of his beta blocker.  Gregg Taylor,M.D. 

## 2018-07-13 NOTE — Progress Notes (Signed)
HPI [name] is referred today by Dr. Tamala Julian for evaluation of exercise induced heart block. He is a pleasant 77 yo man with known LBBB, CAD by cath 17 years ago, fixed defect on stress imaging 8 months ago, who has developed increasing sob and was found on treadmill testing to have exercise induced 2:1 AV block. He has never had syncope. He states that his last heart cath was 17 years ago and he had single vessel disease. He does not have angina.   Allergies  Allergen Reactions  . Contrast Media [Iodinated Diagnostic Agents]     Redness and warm sensation      Current Outpatient Medications  Medication Sig Dispense Refill  . ACCU-CHEK SOFTCLIX LANCETS lancets Use to test blood sugar daily as directed. 100 each 3  . albuterol (PROAIR HFA) 108 (90 BASE) MCG/ACT inhaler Inhale 2 puffs into the lungs every 6 (six) hours as needed for wheezing or shortness of breath.     [name] aspirin 81 MG tablet Take 81 mg by mouth at bedtime.      . Blood Glucose Calibration (ACCU-CHEK AVIVA) SOLN Use as instructed to test blood sugar ICD 10 E11 51 1 each 3  . Blood Glucose Monitoring Suppl (ACCU-CHEK AVIVA PLUS) W/DEVICE KIT Use as directed 1 kit 0  . budesonide-formoterol (SYMBICORT) 160-4.5 MCG/ACT inhaler Inhale 2 puffs into the lungs 2 (two) times daily as needed (wheezing/shortness of breath).    . fexofenadine (ALLEGRA) 180 MG tablet Take 180 mg by mouth daily as needed for allergies.     [name] glucose blood (ACCU-CHEK AVIVA PLUS) test strip Use as instructed to test blood sugar daily ICD 10 E11 51 100 each 3  . HYDROcodone-acetaminophen (NORCO) 7.5-325 MG tablet Take 1 tablet by mouth every 6 (six) hours as needed (for chronic joint pain).    [name] ibuprofen (ADVIL,MOTRIN) 800 MG tablet Take 800 mg by mouth every 8 (eight) hours as needed for moderate pain.     Elmore Guise Devices (AUTOLET IMPRESSION) MISC Use as instructed to test blood sugar ICD 10 E11 51 1 each 3  . levalbuterol (XOPENEX) 1.25 MG/3ML  nebulizer solution Take 1.25 mg by nebulization every 4 (four) hours as needed for wheezing or shortness of breath.    . lisinopril-hydrochlorothiazide (PRINZIDE,ZESTORETIC) 20-12.5 MG tablet Take 1 tablet by mouth daily. 90 tablet 3  . LORazepam (ATIVAN) 1 MG tablet Take 0.5-1 mg by mouth at bedtime as needed for sleep.    . metFORMIN (GLUCOPHAGE) 500 MG tablet Take 1 tablet (500 mg total) by mouth daily with breakfast. 30 tablet 0  . montelukast (SINGULAIR) 10 MG tablet TAKE 1 TABLET BY MOUTH  DAILY 90 tablet 2  . nitroGLYCERIN (NITROSTAT) 0.4 MG SL tablet Place 0.4 mg under the tongue every 5 (five) minutes x 3 doses as needed for chest pain.    . rosuvastatin (CRESTOR) 5 MG tablet Take 5 mg by mouth every Monday, Wednesday, and Friday.    . vardenafil (LEVITRA) 20 MG tablet TAKE 1 TABLET BY MOUTH AS DIRECTED. CAN NOT BE TAKEN WITH NITROGLYCERIN. 6 tablet 2   No current facility-administered medications for this visit.      Past Medical History:  Diagnosis Date  . Allergy   . Arthritis   . Asthma   . Blood transfusion without reported diagnosis   . CAD (coronary artery disease)   . Diabetes mellitus without complication ([place])   . GERD (gastroesophageal reflux disease)   .  HTN (hypertension)   . Hyperlipidemia   . LBBB (left bundle branch block) 1999  . Seasonal allergies   . Sleep apnea     ROS:   All systems reviewed and negative except as noted in the HPI.   Past Surgical History:  Procedure Laterality Date  . CARDIAC CATHETERIZATION  2003   Dr Pernell Dupre; 85 % R circumflex obstruction  . CARPAL TUNNEL RELEASE Left 10/11/2014   Procedure: LEFT CARPAL TUNNEL RELEASE;  Surgeon: Roseanne Kaufman, MD;  Location: [facility];  Service: Orthopedics;  Laterality: Left;  . INGUINAL HERNIA REPAIR     right, age 52  . INGUINAL HERNIA REPAIR     left age 40  . LUMBAR LAMINECTOMY     age 34  . MINOR CARPAL TUNNEL Right 11/22/2014   Procedure: RIGHT LIMITED OPEN  CARPAL TUNNEL RELEASE;  Surgeon: Roseanne Kaufman, MD;  Location: [facility];  Service: Orthopedics;  Laterality: Right;  . TONSILLECTOMY     age 89     Family History  Problem Relation Age of Onset  . Heart attack Father        75s  . Colon polyps Father   . Heart failure Mother        90s  . Subarachnoid hemorrhage Mother 50  . Hypertension Mother   . Subarachnoid hemorrhage Paternal Grandfather 49  . Diabetes Maternal Aunt   . Colon cancer Neg Hx      Social History   Socioeconomic History  . Marital status: Married    Spouse name: Not on file  . Number of children: Not on file  . Years of education: Not on file  . Highest education level: Not on file  Occupational History  . Not on file  Social Needs  . Financial resource strain: Not on file  . Food insecurity:    Worry: Not on file    Inability: Not on file  . Transportation needs:    Medical: Not on file    Non-medical: Not on file  Tobacco Use  . Smoking status: Former Smoker    Last attempt to quit: 12/14/1963    Years since quitting: 54.6  . Smokeless tobacco: Never Used  Substance and Sexual Activity  . Alcohol use: Yes    Alcohol/week: 1.2 oz    Types: 2 Glasses of wine per week    Comment: socially  . Drug use: No  . Sexual activity: Yes  Lifestyle  . Physical activity:    Days per week: Not on file    Minutes per session: Not on file  . Stress: Not on file  Relationships  . Social connections:    Talks on phone: Not on file    Gets together: Not on file    Attends religious service: Not on file    Active member of club or organization: Not on file    Attends meetings of clubs or organizations: Not on file    Relationship status: Not on file  . Intimate partner violence:    Fear of current or ex partner: Not on file    Emotionally abused: Not on file    Physically abused: Not on file    Forced sexual activity: Not on file  Other Topics Concern  . Not on file  Social History  Narrative  . Not on file     BP 120/68   Pulse 73   Ht 5' 9" (1.753 m)   Wt 192 lb  3.2 oz (87.2 kg)   SpO2 98%   BMI 28.38 kg/m   Physical Exam:  Well appearing 76 yo man, NAD HEENT: Unremarkable Neck:  6 cm JVD, no thyromegally Lymphatics:  No adenopathy Back:  No CVA tenderness Lungs:  Clear with no wheezes HEART:  Regular rate rhythm, no murmurs, no rubs, no clicks Abd:  soft, positive bowel sounds, no organomegally, no rebound, no guarding Ext:  2 plus pulses, no edema, no cyanosis, no clubbing Skin:  No rashes no nodules Neuro:  CN II through XII intact, motor grossly intact  EKG - nsr with LBBB  Assess/Plan: 1. Exercise induced AV block - I have discussed the risks/benefits/goals/expectations of BiV PPM insertion with the patient and he wishes to proceed. 2. CAD - I am concerned that he might need a heart cath. I will discuss this more with Dr. Smith. 3. HTN - his blood pressure is well controlled. He is off of his beta blocker.   ,M.D. 

## 2018-07-13 NOTE — Patient Instructions (Addendum)
Medication Instructions:  Your physician recommends that you continue on your current medications as directed. Please refer to the Current Medication list given to you today.   Labwork:  You will get lab work today: BMP and CBC w/ diff.   Testing/Procedures:  Your physician has recommended that you have a pacemaker inserted. A pacemaker is a small device that is placed under the skin of your chest or abdomen to help control abnormal heart rhythms. This device uses electrical pulses to prompt the heart to beat at a normal rate. Pacemakers are used to treat heart rhythms that are too slow. Wire (leads) are attached to the pacemaker that goes into the chambers of you heart. This is done in the hospital and usually requires and overnight stay. Please see the instruction sheet given to you today for more information.   Follow-Up:  You are scheduled for a wound check on 08/07/2018 at 9:00AM. You are scheduled for a follow-up with Dr. Lovena Le on 10/25/2018 at 9:30AM.   Any Other Special Instructions Will Be Listed Below (If Applicable).   Please arrive at the [facility] main entrance of [facility] on July 25, 2018 at 1:00PM. Use the CHG surgical scrub the night before and morning of your procedure.  Follow the instruction sheet.  Do not eat or drink after midnight prior to procedure  On the morning of your procedure you may take your normal morning medications except for: metformin, Lisinopril/HCTZ  Plan for one night stay  You will need someone to drive you home at discharge

## 2018-07-25 ENCOUNTER — Encounter (HOSPITAL_COMMUNITY)
Admission: RE | Disposition: A | Discharge status: Home / Self Care | Payer: Self-pay | Source: Ambulatory Visit | Attending: Internal Medicine

## 2018-07-25 ENCOUNTER — Other Ambulatory Visit: Payer: Self-pay

## 2018-07-25 ENCOUNTER — Encounter (HOSPITAL_COMMUNITY): Payer: Self-pay | Admitting: General Practice

## 2018-07-25 ENCOUNTER — Ambulatory Visit (HOSPITAL_COMMUNITY)
Admission: RE | Admit: 2018-07-25 | Discharge: 2018-07-26 | Disposition: A | Discharge status: Home / Self Care | Payer: Medicare Other | Source: Ambulatory Visit | Attending: Internal Medicine | Admitting: Internal Medicine

## 2018-07-25 DIAGNOSIS — Z9889 Other specified postprocedural states: Secondary | ICD-10-CM | POA: Diagnosis not present

## 2018-07-25 DIAGNOSIS — E119 Type 2 diabetes mellitus without complications: Secondary | ICD-10-CM | POA: Insufficient documentation

## 2018-07-25 DIAGNOSIS — Z7982 Long term (current) use of aspirin: Secondary | ICD-10-CM | POA: Insufficient documentation

## 2018-07-25 DIAGNOSIS — I5022 Chronic systolic (congestive) heart failure: Secondary | ICD-10-CM | POA: Diagnosis not present

## 2018-07-25 DIAGNOSIS — I255 Ischemic cardiomyopathy: Secondary | ICD-10-CM | POA: Diagnosis not present

## 2018-07-25 DIAGNOSIS — I110 Hypertensive heart disease with heart failure: Secondary | ICD-10-CM | POA: Insufficient documentation

## 2018-07-25 DIAGNOSIS — G4730 Sleep apnea, unspecified: Secondary | ICD-10-CM | POA: Insufficient documentation

## 2018-07-25 DIAGNOSIS — Z87891 Personal history of nicotine dependence: Secondary | ICD-10-CM | POA: Diagnosis not present

## 2018-07-25 DIAGNOSIS — J45909 Unspecified asthma, uncomplicated: Secondary | ICD-10-CM | POA: Insufficient documentation

## 2018-07-25 DIAGNOSIS — Z7984 Long term (current) use of oral hypoglycemic drugs: Secondary | ICD-10-CM | POA: Insufficient documentation

## 2018-07-25 DIAGNOSIS — K219 Gastro-esophageal reflux disease without esophagitis: Secondary | ICD-10-CM | POA: Diagnosis not present

## 2018-07-25 DIAGNOSIS — M1990 Unspecified osteoarthritis, unspecified site: Secondary | ICD-10-CM | POA: Diagnosis not present

## 2018-07-25 DIAGNOSIS — Z79899 Other long term (current) drug therapy: Secondary | ICD-10-CM | POA: Diagnosis not present

## 2018-07-25 DIAGNOSIS — E785 Hyperlipidemia, unspecified: Secondary | ICD-10-CM | POA: Diagnosis not present

## 2018-07-25 DIAGNOSIS — I2510 Atherosclerotic heart disease of native coronary artery without angina pectoris: Secondary | ICD-10-CM | POA: Insufficient documentation

## 2018-07-25 DIAGNOSIS — I441 Atrioventricular block, second degree: Secondary | ICD-10-CM | POA: Diagnosis not present

## 2018-07-25 DIAGNOSIS — Z8249 Family history of ischemic heart disease and other diseases of the circulatory system: Secondary | ICD-10-CM | POA: Insufficient documentation

## 2018-07-25 DIAGNOSIS — I5042 Chronic combined systolic (congestive) and diastolic (congestive) heart failure: Secondary | ICD-10-CM | POA: Diagnosis present

## 2018-07-25 DIAGNOSIS — Z91041 Radiographic dye allergy status: Secondary | ICD-10-CM | POA: Diagnosis not present

## 2018-07-25 DIAGNOSIS — Z950 Presence of cardiac pacemaker: Secondary | ICD-10-CM

## 2018-07-25 HISTORY — DX: Pneumonia, unspecified organism: J18.9

## 2018-07-25 HISTORY — DX: Acute myocardial infarction, unspecified: I21.9

## 2018-07-25 HISTORY — DX: Type 2 diabetes mellitus without complications: E11.9

## 2018-07-25 HISTORY — DX: Presence of cardiac pacemaker: Z95.0

## 2018-07-25 HISTORY — DX: Heart failure, unspecified: I50.9

## 2018-07-25 HISTORY — DX: Unspecified chronic bronchitis: J42

## 2018-07-25 HISTORY — DX: Headache, unspecified: R51.9

## 2018-07-25 HISTORY — PX: BIV PACEMAKER INSERTION CRT-P: EP1199

## 2018-07-25 HISTORY — DX: Personal history of other medical treatment: Z92.89

## 2018-07-25 HISTORY — DX: Other allergy status, other than to drugs and biological substances: Z91.09

## 2018-07-25 HISTORY — DX: Headache: R51

## 2018-07-25 LAB — GLUCOSE, CAPILLARY
Glucose-Capillary: 122 mg/dL — ABNORMAL HIGH (ref 70–99)
Glucose-Capillary: 154 mg/dL — ABNORMAL HIGH (ref 70–99)
Glucose-Capillary: 164 mg/dL — ABNORMAL HIGH (ref 70–99)

## 2018-07-25 LAB — SURGICAL PCR SCREEN
MRSA, PCR: NEGATIVE
Staphylococcus aureus: POSITIVE — AB

## 2018-07-25 SURGERY — BIV PACEMAKER INSERTION CRT-P

## 2018-07-25 MED ORDER — ACETAMINOPHEN 325 MG PO TABS
325.0000 mg | ORAL_TABLET | ORAL | Status: DC | PRN
  Administered 2018-07-25: 650 mg via ORAL
  Filled 2018-07-25: qty 2

## 2018-07-25 MED ORDER — FENTANYL CITRATE (PF) 100 MCG/2ML IJ SOLN
INTRAMUSCULAR | Status: DC | PRN
  Administered 2018-07-25 (×6): 12.5 ug via INTRAVENOUS

## 2018-07-25 MED ORDER — MIDAZOLAM HCL 5 MG/5ML IJ SOLN
INTRAMUSCULAR | Status: DC | PRN
  Administered 2018-07-25 (×8): 1 mg via INTRAVENOUS

## 2018-07-25 MED ORDER — LISINOPRIL-HYDROCHLOROTHIAZIDE 20-12.5 MG PO TABS: 1.0000 | ORAL_TABLET | Freq: Every day | ORAL | Status: DC

## 2018-07-25 MED ORDER — LIDOCAINE HCL (PF) 1 % IJ SOLN
INTRAMUSCULAR | Status: AC
  Filled 2018-07-25: qty 60

## 2018-07-25 MED ORDER — MUPIROCIN 2 % EX OINT
1.0000 "application " | TOPICAL_OINTMENT | Freq: Once | CUTANEOUS | Status: AC
  Administered 2018-07-25: 1 via TOPICAL

## 2018-07-25 MED ORDER — SODIUM CHLORIDE 0.9 % IV SOLN
INTRAVENOUS | Status: AC
  Filled 2018-07-25: qty 2

## 2018-07-25 MED ORDER — FENTANYL CITRATE (PF) 100 MCG/2ML IJ SOLN
INTRAMUSCULAR | Status: AC
  Filled 2018-07-25: qty 2

## 2018-07-25 MED ORDER — MUPIROCIN 2 % EX OINT
TOPICAL_OINTMENT | CUTANEOUS | Status: AC
  Administered 2018-07-25: 14:00:00
  Filled 2018-07-25: qty 22

## 2018-07-25 MED ORDER — CEFAZOLIN SODIUM-DEXTROSE 1-4 GM/50ML-% IV SOLN
1.0000 g | Freq: Four times a day (QID) | INTRAVENOUS | Status: AC
  Administered 2018-07-25 – 2018-07-26 (×3): 1 g via INTRAVENOUS
  Filled 2018-07-25 (×4): qty 50

## 2018-07-25 MED ORDER — ALPRAZOLAM 0.25 MG PO TABS
0.2500 mg | ORAL_TABLET | Freq: Once | ORAL | Status: AC
  Administered 2018-07-25: 22:00:00 0.25 mg via ORAL
  Filled 2018-07-25: qty 1

## 2018-07-25 MED ORDER — METHYLPREDNISOLONE SODIUM SUCC 125 MG IJ SOLR
125.0000 mg | INTRAMUSCULAR | Status: AC
  Administered 2018-07-25: 125 mg via INTRAVENOUS

## 2018-07-25 MED ORDER — HEPARIN (PORCINE) IN NACL 1000-0.9 UT/500ML-% IV SOLN
INTRAVENOUS | Status: AC
  Filled 2018-07-25: qty 500

## 2018-07-25 MED ORDER — HYDROCHLOROTHIAZIDE 12.5 MG PO CAPS
12.5000 mg | ORAL_CAPSULE | Freq: Every day | ORAL | Status: DC
  Filled 2018-07-25 (×2): qty 1

## 2018-07-25 MED ORDER — CHLORHEXIDINE GLUCONATE 4 % EX LIQD: 60.0000 mL | Freq: Once | CUTANEOUS | Status: DC

## 2018-07-25 MED ORDER — CEFAZOLIN SODIUM-DEXTROSE 2-4 GM/100ML-% IV SOLN
2.0000 g | INTRAVENOUS | Status: AC
  Administered 2018-07-25: 2 g via INTRAVENOUS

## 2018-07-25 MED ORDER — METHYLPREDNISOLONE SODIUM SUCC 125 MG IJ SOLR
INTRAMUSCULAR | Status: AC
  Filled 2018-07-25: qty 2

## 2018-07-25 MED ORDER — IOPAMIDOL (ISOVUE-370) INJECTION 76%
INTRAVENOUS | Status: DC | PRN
  Administered 2018-07-25: 10 mL via INTRAVENOUS

## 2018-07-25 MED ORDER — MIDAZOLAM HCL 5 MG/5ML IJ SOLN
INTRAMUSCULAR | Status: AC
  Filled 2018-07-25: qty 5

## 2018-07-25 MED ORDER — ONDANSETRON HCL 4 MG/2ML IJ SOLN: 4.0000 mg | Freq: Four times a day (QID) | INTRAMUSCULAR | Status: DC | PRN

## 2018-07-25 MED ORDER — DIPHENHYDRAMINE HCL 50 MG/ML IJ SOLN
INTRAMUSCULAR | Status: AC
  Filled 2018-07-25: qty 1

## 2018-07-25 MED ORDER — IOPAMIDOL (ISOVUE-370) INJECTION 76%
INTRAVENOUS | Status: AC
  Filled 2018-07-25: qty 50

## 2018-07-25 MED ORDER — DIPHENHYDRAMINE HCL 50 MG/ML IJ SOLN
25.0000 mg | INTRAMUSCULAR | Status: AC
  Administered 2018-07-25: 25 mg via INTRAVENOUS

## 2018-07-25 MED ORDER — LISINOPRIL 10 MG PO TABS
20.0000 mg | ORAL_TABLET | Freq: Every day | ORAL | Status: DC
  Filled 2018-07-25 (×2): qty 2

## 2018-07-25 MED ORDER — CEFAZOLIN SODIUM-DEXTROSE 2-4 GM/100ML-% IV SOLN
INTRAVENOUS | Status: AC
  Filled 2018-07-25: qty 100

## 2018-07-25 MED ORDER — HEPARIN (PORCINE) IN NACL 1000-0.9 UT/500ML-% IV SOLN
INTRAVENOUS | Status: DC | PRN
  Administered 2018-07-25: 500 mL

## 2018-07-25 MED ORDER — LIDOCAINE HCL (PF) 1 % IJ SOLN
INTRAMUSCULAR | Status: DC | PRN
  Administered 2018-07-25: 45 mL

## 2018-07-25 MED ORDER — SODIUM CHLORIDE 0.9 % IV SOLN
INTRAVENOUS | Status: DC
  Administered 2018-07-25: 14:00:00 via INTRAVENOUS

## 2018-07-25 MED ORDER — YOU HAVE A PACEMAKER BOOK
Freq: Once | Status: AC
  Administered 2018-07-25: 22:00:00 1
  Filled 2018-07-25: qty 1

## 2018-07-25 MED ORDER — HEPARIN (PORCINE) IN NACL 2000-0.9 UNIT/L-% IV SOLN
INTRAVENOUS | Status: AC
  Filled 2018-07-25: qty 1000

## 2018-07-25 MED ORDER — SODIUM CHLORIDE 0.9 % IV SOLN
80.0000 mg | INTRAVENOUS | Status: AC
  Administered 2018-07-25: 80 mg

## 2018-07-25 SURGICAL SUPPLY — 19 items
CABLE SURGICAL S-101-97-12 (CABLE) ×2 IMPLANT
CATH CPS DIRECT 135 DS2C020 (CATHETERS) ×2 IMPLANT
CATH HEX JOSEPH 2-5-2 65CM 6F (CATHETERS) ×2 IMPLANT
CPS IMPLANT KIT 410190 (MISCELLANEOUS) ×2 IMPLANT
LEAD QUARTET 1456Q-86 (Lead) ×1 IMPLANT
LEAD TENDRIL MRI 52CM LPA1200M (Lead) ×2 IMPLANT
LEAD TENDRIL MRI 58CM LPA1200M (Lead) ×2 IMPLANT
PACEMAKER QUDR ALLR CRT PM3562 (Pacemaker) ×1 IMPLANT
PAD DEFIB LIFELINK (PAD) ×2 IMPLANT
PMKR QUADRA ALLURE CRT PM3562 (Pacemaker) ×2 IMPLANT
QUARTET 1456Q-86 (Lead) ×2 IMPLANT
SHEATH CLASSIC 8F (SHEATH) ×4 IMPLANT
SHEATH CLASSIC 8F 25CM (SHEATH) IMPLANT
SHEATH WORLEY 9FR 62CM (SHEATH) ×2 IMPLANT
SLITTER UNIVERSAL DS2A003 (MISCELLANEOUS) ×2 IMPLANT
TRAY PACEMAKER INSERTION (PACKS) ×2 IMPLANT
WIRE AQUATRAK .035X150 ANG (WIRE) ×2 IMPLANT
WIRE LUGE 182CM (WIRE) ×2 IMPLANT
WIRE MAILMAN 182CM (WIRE) ×2 IMPLANT

## 2018-07-25 NOTE — Interval H&P Note (Signed)
History and Physical Interval Note:  07/25/2018 4:07 PM  Jeffery [name]  has presented today for surgery, with the diagnosis of second degree av block  The various methods of treatment have been discussed with the patient and family. After consideration of risks, benefits and other options for treatment, the patient has consented to  Procedure(s): BIV PACEMAKER INSERTION CRT-P (N/A) as a surgical intervention .  The patient's history has been reviewed, patient examined, no change in status, stable for surgery.  I have reviewed the patient's chart and labs.  Questions were answered to the patient's satisfaction.     Cristopher Peru

## 2018-07-26 ENCOUNTER — Ambulatory Visit (HOSPITAL_COMMUNITY): Payer: Medicare Other

## 2018-07-26 ENCOUNTER — Encounter (HOSPITAL_COMMUNITY): Payer: Self-pay | Admitting: Internal Medicine

## 2018-07-26 ENCOUNTER — Telehealth: Payer: Self-pay | Admitting: *Deleted

## 2018-07-26 DIAGNOSIS — Z8249 Family history of ischemic heart disease and other diseases of the circulatory system: Secondary | ICD-10-CM | POA: Diagnosis not present

## 2018-07-26 DIAGNOSIS — I255 Ischemic cardiomyopathy: Secondary | ICD-10-CM | POA: Diagnosis not present

## 2018-07-26 DIAGNOSIS — Z91041 Radiographic dye allergy status: Secondary | ICD-10-CM | POA: Diagnosis not present

## 2018-07-26 DIAGNOSIS — G4730 Sleep apnea, unspecified: Secondary | ICD-10-CM | POA: Diagnosis not present

## 2018-07-26 DIAGNOSIS — Z9889 Other specified postprocedural states: Secondary | ICD-10-CM | POA: Diagnosis not present

## 2018-07-26 DIAGNOSIS — Z7984 Long term (current) use of oral hypoglycemic drugs: Secondary | ICD-10-CM | POA: Diagnosis not present

## 2018-07-26 DIAGNOSIS — J45909 Unspecified asthma, uncomplicated: Secondary | ICD-10-CM | POA: Diagnosis not present

## 2018-07-26 DIAGNOSIS — I2510 Atherosclerotic heart disease of native coronary artery without angina pectoris: Secondary | ICD-10-CM | POA: Diagnosis not present

## 2018-07-26 DIAGNOSIS — E785 Hyperlipidemia, unspecified: Secondary | ICD-10-CM | POA: Diagnosis not present

## 2018-07-26 DIAGNOSIS — I441 Atrioventricular block, second degree: Secondary | ICD-10-CM

## 2018-07-26 DIAGNOSIS — Z7982 Long term (current) use of aspirin: Secondary | ICD-10-CM | POA: Diagnosis not present

## 2018-07-26 DIAGNOSIS — I5022 Chronic systolic (congestive) heart failure: Secondary | ICD-10-CM | POA: Diagnosis not present

## 2018-07-26 DIAGNOSIS — E119 Type 2 diabetes mellitus without complications: Secondary | ICD-10-CM | POA: Diagnosis not present

## 2018-07-26 DIAGNOSIS — Z87891 Personal history of nicotine dependence: Secondary | ICD-10-CM | POA: Diagnosis not present

## 2018-07-26 DIAGNOSIS — K219 Gastro-esophageal reflux disease without esophagitis: Secondary | ICD-10-CM | POA: Diagnosis not present

## 2018-07-26 DIAGNOSIS — Z79899 Other long term (current) drug therapy: Secondary | ICD-10-CM | POA: Diagnosis not present

## 2018-07-26 DIAGNOSIS — I110 Hypertensive heart disease with heart failure: Secondary | ICD-10-CM | POA: Diagnosis not present

## 2018-07-26 DIAGNOSIS — M1990 Unspecified osteoarthritis, unspecified site: Secondary | ICD-10-CM | POA: Diagnosis not present

## 2018-07-26 DIAGNOSIS — Z950 Presence of cardiac pacemaker: Secondary | ICD-10-CM | POA: Diagnosis not present

## 2018-07-26 LAB — GLUCOSE, CAPILLARY: GLUCOSE-CAPILLARY: 170 mg/dL — AB (ref 70–99)

## 2018-07-26 MED ORDER — HYDROCODONE-ACETAMINOPHEN 5-325 MG PO TABS
1.0000 | ORAL_TABLET | Freq: Four times a day (QID) | ORAL | Status: DC | PRN
  Administered 2018-07-26: 1 via ORAL
  Filled 2018-07-26: qty 1

## 2018-07-26 MED ORDER — MUPIROCIN 2 % EX OINT
TOPICAL_OINTMENT | CUTANEOUS | Status: AC
  Filled 2018-07-26: qty 22

## 2018-07-26 NOTE — Telephone Encounter (Signed)
Pt was on TCM report admitted 07/25/18 for Implantation of a STJ CRTP. Pt was monitored on telemetry overnight which demonstrated sinus rhythm with V pacing.  Left chest was without hematoma or ecchymosis.  The device was interrogated and found to be functioning normally.  CXR was obtained and demonstrated no pneumothorax status post device implantation. Pt D/C 07/26/18, and will follow-up cardiology on 08/07/18.Marland KitchenJohny Chess

## 2018-07-26 NOTE — Discharge Instructions (Signed)
° ° °  Supplemental Discharge Instructions for  Pacemaker/Defibrillator Patients  Activity No heavy lifting or vigorous activity with your left/right arm for 6 to 8 weeks.  Do not raise your left/right arm above your head for one week.  Gradually raise your affected arm as drawn below.           __        07/30/18                    07/31/18                    08/01/18                   08/02/18  NO DRIVING for  1 week   ; you may begin driving on  5/97/41   .  WOUND CARE - Keep the wound area clean and dry.  Do not get this area wet for one week. No showers for one week; you may shower on   08/02/18  . - The tape/steri-strips on your wound will fall off; do not pull them off.  No bandage is needed on the site.  DO  NOT apply any creams, oils, or ointments to the wound area. - If you notice any drainage or discharge from the wound, any swelling or bruising at the site, or you develop a fever > 101? F after you are discharged home, call the office at once.  Special Instructions - You are still able to use cellular telephones; use the ear opposite the side where you have your pacemaker/defibrillator.  Avoid carrying your cellular phone near your device. - When traveling through airports, show security personnel your identification card to avoid being screened in the metal detectors.  Ask the security personnel to use the hand wand. - Avoid arc welding equipment, TENS units (transcutaneous nerve stimulators).  Call the office for questions about other devices. - Avoid electrical appliances that are in poor condition or are not properly grounded. - Microwave ovens are safe to be near or to operate.

## 2018-07-26 NOTE — Discharge Summary (Addendum)
ELECTROPHYSIOLOGY PROCEDURE DISCHARGE SUMMARY    Patient ID: Jeffery [name],  MRN: [id number], DOB/AGE: 1941/09/23 77 y.o.  Admit date: 07/25/2018 Discharge date: 07/26/2018  Primary Care Physician: [name], MD Primary Cardiologist: Tamala Julian Electrophysiologist: Lovena Le  Primary Discharge Diagnosis:  Symptomatic 2:1 heart block status post CRTP implantation this admission  Secondary Discharge Diagnosis:  1.  HTN 2.  CAD 3.  GERD 4.  Hyperlipidemia  Allergies  Allergen Reactions  . Contrast Media [Iodinated Diagnostic Agents] Other (See Comments)    Redness and warm sensation      Procedures This Admission:  1.  Implantation of a STJ CRTP on 07/25/18 by Dr Lovena Le. See op note for full details. There were no immediate post procedure complications. 2.  CXR on 07/26/18 demonstrated no pneumothorax status post device implantation.   Brief HPI: [name] is a 77 y.o. male was referred to electrophysiology in the outpatient setting for consideration of PPM implantation.  Past medical history includes exercise induced heart block, LBBB.  The patient has had symptomatic bradycardia without reversible causes identified.  Risks, benefits, and alternatives to PPM implantation were reviewed with the patient who wished to proceed.   Hospital Course:  The patient was admitted and underwent implantation of a STJ CRTP with details as outlined above.  He  was monitored on telemetry overnight which demonstrated sinus rhythm with V pacing.  Left chest was without hematoma or ecchymosis.  The device was interrogated and found to be functioning normally.  CXR was obtained and demonstrated no pneumothorax status post device implantation.  Wound care, arm mobility, and restrictions were reviewed with the patient.  The patient was examined and considered stable for discharge to home.    Physical Exam: Vitals:   07/25/18 1917 07/25/18 2000 07/26/18 0533 07/26/18 0708  BP: 129/83   (!) 158/87 127/68  Pulse: 83 89 66 62  Resp: 16 16 18 10   Temp: 97.6 F (36.4 C)  (!) 97.5 F (36.4 C) 97.6 F (36.4 C)  TempSrc: Oral  Oral   SpO2: 99% 97% 97% 98%  Weight:   85 kg   Height:        GEN- The patient is well appearing, alert and oriented x 3 today.   HEENT: normocephalic, atraumatic; sclera clear, conjunctiva pink; hearing intact; oropharynx clear; neck supple  Lungs- Clear to ausculation bilaterally, normal work of breathing.  No wheezes, rales, rhonchi Heart- Regular rate and rhythm  GI- soft, non-tender, non-distended, bowel sounds present  Extremities- no clubbing, cyanosis, or edema  MS- no significant deformity or atrophy Skin- warm and dry, no rash or lesion, left chest without hematoma/ecchymosis Psych- euthymic mood, full affect Neuro- strength and sensation are intact   Labs:   Lab Results  Component Value Date   WBC 6.3 07/13/2018   HGB 14.4 07/13/2018   HCT 43.1 07/13/2018   MCV 86 07/13/2018   PLT 298 07/13/2018   No results for input(s): NA, K, CL, CO2, BUN, CREATININE, CALCIUM, PROT, BILITOT, ALKPHOS, ALT, AST, GLUCOSE in the last 168 hours.  Invalid input(s): LABALBU  Discharge Medications:  Allergies as of 07/26/2018      Reactions   Contrast Media [iodinated Diagnostic Agents] Other (See Comments)   Redness and warm sensation       Medication List    TAKE these medications   ACCU-CHEK AVIVA PLUS w/Device Kit Use as directed   ACCU-CHEK AVIVA Soln Use as instructed to test blood sugar ICD 10  E11 51   ACCU-CHEK SOFTCLIX LANCETS lancets Use to test blood sugar daily as directed.   aspirin 81 MG tablet Take 81 mg by mouth at bedtime.   AUTOLET IMPRESSION Misc Use as instructed to test blood sugar ICD 10 E11 51   budesonide-formoterol 160-4.5 MCG/ACT inhaler Commonly known as:  SYMBICORT Inhale 2 puffs into the lungs 2 (two) times daily.   fexofenadine 180 MG tablet Commonly known as:  ALLEGRA Take 180 mg by mouth daily  as needed for allergies.   glucose blood test strip Use as instructed to test blood sugar daily ICD 10 E11 51   HYDROcodone-acetaminophen 7.5-325 MG tablet Commonly known as:  NORCO Take 1 tablet by mouth every 6 (six) hours as needed (for chronic joint pain).   ibuprofen 800 MG tablet Commonly known as:  ADVIL,MOTRIN Take 800 mg by mouth every 8 (eight) hours as needed for headache or moderate pain.   levalbuterol 1.25 MG/3ML nebulizer solution Commonly known as:  XOPENEX Take 1.25 mg by nebulization every 4 (four) hours as needed for wheezing or shortness of breath.   lisinopril-hydrochlorothiazide 20-12.5 MG tablet Commonly known as:  PRINZIDE,ZESTORETIC Take 1 tablet by mouth daily.   LORazepam 1 MG tablet Commonly known as:  ATIVAN Take 0.5-1 mg by mouth at bedtime as needed for sleep.   metFORMIN 500 MG tablet Commonly known as:  GLUCOPHAGE Take 1 tablet (500 mg total) by mouth daily with breakfast.   montelukast 10 MG tablet Commonly known as:  SINGULAIR TAKE 1 TABLET BY MOUTH  DAILY   NEXIUM 24HR 20 MG capsule Generic drug:  esomeprazole Take 20 mg by mouth daily.   nitroGLYCERIN 0.4 MG SL tablet Commonly known as:  NITROSTAT Place 0.4 mg under the tongue every 5 (five) minutes x 3 doses as needed for chest pain.   PROAIR HFA 108 (90 Base) MCG/ACT inhaler Generic drug:  albuterol Inhale 2 puffs into the lungs every 6 (six) hours as needed for wheezing or shortness of breath.   rosuvastatin 5 MG tablet Commonly known as:  CRESTOR Take 5 mg by mouth every Monday, Wednesday, and Friday.   vardenafil 20 MG tablet Commonly known as:  LEVITRA TAKE 1 TABLET BY MOUTH AS DIRECTED. CAN NOT BE TAKEN WITH NITROGLYCERIN. What changed:    how much to take  how to take this  when to take this  reasons to take this  additional instructions       Disposition:  Discharge Instructions    Diet - low sodium heart healthy   Complete by:  As directed     Increase activity slowly   Complete by:  As directed      Follow-up Information    [facility] Follow up on 08/07/2018.   Specialty:  Cardiology Why:  at [facility] for wound check  Contact information: [address]       Evans Lance, MD Follow up on 10/25/2018.   Specialty:  Cardiology Why:  at 9:30AM Contact information: [place]. [place] [address] [phone]           Duration of Discharge Encounter: Greater than 30 minutes including physician time.  Signed, Chanetta Marshall, NP 07/26/2018 8:27 AM   EP Attending  Patient seen and examined. Agree with the findings as noted above. The patient is doing well after bi ppm insertion for symptomatic 2:1 AV block in the setting of  LV dysfunction. PM interogation under my direction shows normal biv PPM function. DC home with usual followup.   Mikle Bosworth.D.

## 2018-07-27 ENCOUNTER — Telehealth: Payer: Self-pay | Admitting: Interventional Cardiology

## 2018-07-27 NOTE — Telephone Encounter (Signed)
New Message:  Pt want to confirm if he should take the Beta Blocker since the Pacemarker surgery   Please return call

## 2018-07-27 NOTE — Telephone Encounter (Signed)
Please resume standard dose Bystolic as prior to DC. Track BP with goal < 130/80 mmHg.

## 2018-07-27 NOTE — Telephone Encounter (Signed)
Spoke with pt and let him know that I have sent the MyChart messages to Dr. Tamala Julian to review and I am waiting to hear back.  HR has been 70-90.  BP has been 163/80 and 162/84.  Advised I will send message to Dr. Tamala Julian for review. Pt was on Bystolic 5mg  QD prior to Pacemaker and wants to know whether to restart or not?

## 2018-07-27 NOTE — Telephone Encounter (Signed)
Left message to call back  

## 2018-07-31 MED ORDER — NEBIVOLOL HCL 5 MG PO TABS: 5.0000 mg | ORAL_TABLET | Freq: Every day | ORAL | 11 refills | Status: DC

## 2018-07-31 NOTE — Telephone Encounter (Addendum)
Spoke with pt and made him aware of recommendations per Dr. Tamala Julian.  Pt started Bystolic 5mg  QD on Friday on his own.  States BP was 90's/50's, HR upper 70's-low80's.  On his own starting taking half tab yesterday and BP was 108/60's, HR low 70's.  Advised pt to continue monitoring BP and HR and contact us on Wednesday with those readings so I can get them to Dr. Tamala Julian for review so a decision can be made about Bystolic dosage.  While on the phone pt mentioned he called the on call yesterday because he spiked a fever of 100.00 and d/c paperwork after pacemaker placement said to call.  Pt states shortly after fever noted he developed diarrhea.  Took meds to stop this and took Tylenol for fever.  Pt also took in fluids to rehydrate.  4 hours later temp was down to 97.4.  Pt has had no further issues.  Pt denies redness, heat, or drainage from incision site.  Advised pt to continue to monitor and to contact office if fever returns.  Pt verbalized understanding.

## 2018-08-05 ENCOUNTER — Other Ambulatory Visit: Payer: Self-pay | Admitting: Internal Medicine

## 2018-08-07 ENCOUNTER — Ambulatory Visit (INDEPENDENT_AMBULATORY_CARE_PROVIDER_SITE_OTHER): Payer: Medicare Other | Admitting: *Deleted

## 2018-08-07 DIAGNOSIS — I447 Left bundle-branch block, unspecified: Secondary | ICD-10-CM | POA: Diagnosis not present

## 2018-08-07 DIAGNOSIS — I5022 Chronic systolic (congestive) heart failure: Secondary | ICD-10-CM | POA: Diagnosis not present

## 2018-08-07 LAB — CUP PACEART INCLINIC DEVICE CHECK
Battery Remaining Longevity: 68 mo
Battery Voltage: 3.04 V
Brady Statistic RA Percent Paced: 8.9 %
Date Time Interrogation Session: 20190826101406
Implantable Lead Implant Date: 20190813
Implantable Lead Location: 753858
Implantable Lead Location: 753860
Implantable Pulse Generator Implant Date: 20190813
Lead Channel Impedance Value: 537.5 Ohm
Lead Channel Impedance Value: 650 Ohm
Lead Channel Pacing Threshold Amplitude: 0.5 V
Lead Channel Pacing Threshold Amplitude: 0.5 V
Lead Channel Pacing Threshold Amplitude: 0.75 V
Lead Channel Pacing Threshold Amplitude: 0.75 V
Lead Channel Pacing Threshold Amplitude: 1 V
Lead Channel Pacing Threshold Pulse Width: 0.5 ms
Lead Channel Pacing Threshold Pulse Width: 0.5 ms
Lead Channel Pacing Threshold Pulse Width: 0.5 ms
Lead Channel Pacing Threshold Pulse Width: 0.5 ms
Lead Channel Sensing Intrinsic Amplitude: 11.7 mV
Lead Channel Setting Pacing Amplitude: 1.625
Lead Channel Setting Pacing Amplitude: 3.5 V
Lead Channel Setting Pacing Pulse Width: 0.5 ms
Lead Channel Setting Sensing Sensitivity: 2 mV
MDC IDC LEAD IMPLANT DT: 20190813
MDC IDC LEAD IMPLANT DT: 20190813
MDC IDC LEAD LOCATION: 753859
MDC IDC MSMT LEADCHNL LV PACING THRESHOLD AMPLITUDE: 1 V
MDC IDC MSMT LEADCHNL RA IMPEDANCE VALUE: 537.5 Ohm
MDC IDC MSMT LEADCHNL RA PACING THRESHOLD PULSEWIDTH: 0.5 ms
MDC IDC MSMT LEADCHNL RA SENSING INTR AMPL: 2.2 mV
MDC IDC MSMT LEADCHNL RV PACING THRESHOLD PULSEWIDTH: 0.5 ms
MDC IDC SET LEADCHNL LV PACING AMPLITUDE: 2.25 V
MDC IDC SET LEADCHNL RV PACING PULSEWIDTH: 0.5 ms
MDC IDC STAT BRADY RV PERCENT PACED: 99 %
Pulse Gen Serial Number: 9043864

## 2018-08-07 NOTE — Progress Notes (Signed)
Wound check appointment. Steri-strips removed. Wound without redness or edema. Incision edges approximated, wound well healed. Normal device function. Thresholds, sensing, and impedances consistent with implant measurements. VA conduction noted at 242ms, PVARP increased from 265ms to 371ms, MTR/PMT detection reduced from 125bpm to 120bpm. Device programmed at 3.5V in RA for extra safety margin until 3 month visit. RA/LV auto cap turned on at implant. Histogram distribution appropriate for patient and level of activity. No mode switches or high ventricular rates noted. Patient educated about wound care, arm mobility, lifting restrictions. ROV in 3 months with GT.

## 2018-08-07 NOTE — Telephone Encounter (Signed)
[place] Controlled Substance Database checked. Last filled on  07/05/18

## 2018-08-11 ENCOUNTER — Ambulatory Visit: Payer: Medicare Other | Admitting: Podiatry

## 2018-08-11 ENCOUNTER — Encounter: Payer: Self-pay | Admitting: Podiatry

## 2018-08-11 DIAGNOSIS — B079 Viral wart, unspecified: Secondary | ICD-10-CM | POA: Diagnosis not present

## 2018-08-11 NOTE — Progress Notes (Signed)
Subjective:   Patient ID: Jeffery [name], male   DOB: 77 y.o.   MRN: [id number]   HPI patient presents stating that he is having pain in the third digit left foot lesion formation is making it hard for him to wear shoe gear comfortably and its been about 5 months since it reoccurred    ROS      Objective:  Physical Exam  Neurovascular status intact with patient found to have painful keratotic lesion plantar third digit left that upon debridement shows pinpoint bleeding     Assessment:  Verruca plantaris plantar aspect digit 3 left     Plan:  Anesthetized the digit 60 mg like Marcaine mixture and using sterile instrumentation debrided the lesion and applied chemical agent to create immune response with sterile dressing applied

## 2018-08-22 ENCOUNTER — Other Ambulatory Visit: Payer: Self-pay | Admitting: Internal Medicine

## 2018-08-24 ENCOUNTER — Encounter: Payer: Self-pay | Admitting: Internal Medicine

## 2018-08-24 MED ORDER — HYDROCODONE-ACETAMINOPHEN 7.5-325 MG PO TABS: 1.0000 | ORAL_TABLET | Freq: Four times a day (QID) | ORAL | 0 refills | Status: DC | PRN

## 2018-08-31 ENCOUNTER — Other Ambulatory Visit: Payer: Self-pay | Admitting: Internal Medicine

## 2018-08-31 ENCOUNTER — Encounter: Payer: Self-pay | Admitting: Internal Medicine

## 2018-08-31 ENCOUNTER — Ambulatory Visit: Payer: Medicare Other | Admitting: Internal Medicine

## 2018-08-31 VITALS — BP 134/70 | HR 61 | Temp 97.7°F | Resp 16 | Ht 69.0 in | Wt 191.0 lb

## 2018-08-31 DIAGNOSIS — L739 Follicular disorder, unspecified: Secondary | ICD-10-CM

## 2018-08-31 DIAGNOSIS — F419 Anxiety disorder, unspecified: Secondary | ICD-10-CM

## 2018-08-31 MED ORDER — CLOTRIMAZOLE-BETAMETHASONE 1-0.05 % EX CREA: 1.0000 "application " | TOPICAL_CREAM | Freq: Two times a day (BID) | CUTANEOUS | 0 refills | Status: DC

## 2018-08-31 NOTE — Progress Notes (Signed)
Subjective:    Patient ID: Jeffery [name], male    DOB: Jun 01, 1941, 77 y.o.   MRN: [id number]  HPI The patient is here for an acute visit.  Rash: He has a very itchy rash in his upper chest that he thinks started approximately 1 month ago.  It started not long after he had the pacemaker placed.  He denies any known new products, medications or supplements that may have triggered the rash.  The degree of itchiness varies.  He has taken Benadryl at night, but has not applied anything topically.  The only other thing that he had during that time was his flu shot, but he does not think that was a cause either.  He denies rash elsewhere.  Anxiety, difficulty sleeping: He takes the Ativan at bedtime as needed only.  Medication works well and he denies any side effects.  Osteoarthritis: He continues to take the hydrocodone only as needed medication works well without side effects.  He has been on it for years.  Medications and allergies reviewed with patient and updated if appropriate.  Patient Active Problem List   Diagnosis Date Noted  . Chronic systolic heart failure ([place]) 07/25/2018  . Anxiety 03/26/2018  . Metatarsal bone fracture 03/07/2018  . Encounter for chronic pain management 04/13/2017  . Osteoarthritis 04/12/2017  . Chronic combined systolic and diastolic CHF (congestive heart failure) ([place]) 01/24/2015  . OSA (obstructive sleep apnea) 10/03/2014  . Left bundle branch block 11/27/2013  . Essential hypertension 11/27/2013  . GERD (gastroesophageal reflux disease) 02/27/2013  . Type 2 diabetes mellitus with vascular disease ([place]) 04/09/2009  . Coronary atherosclerosis 09/06/2008  . Asthma 09/06/2008    Current Outpatient Medications on File Prior to Visit  Medication Sig Dispense Refill  . ACCU-CHEK SOFTCLIX LANCETS lancets Use to test blood sugar daily as directed. 100 each 3  . albuterol (PROAIR HFA) 108 (90 BASE) MCG/ACT inhaler Inhale 2 puffs into the lungs every 6  (six) hours as needed for wheezing or shortness of breath.     [name] aspirin 81 MG tablet Take 81 mg by mouth at bedtime.      . Blood Glucose Calibration (ACCU-CHEK AVIVA) SOLN Use as instructed to test blood sugar ICD 10 E11 51 1 each 3  . Blood Glucose Monitoring Suppl (ACCU-CHEK AVIVA PLUS) W/DEVICE KIT Use as directed 1 kit 0  . budesonide-formoterol (SYMBICORT) 160-4.5 MCG/ACT inhaler Inhale 2 puffs into the lungs 2 (two) times daily.     [name] esomeprazole (NEXIUM 24HR) 20 MG capsule Take 20 mg by mouth daily.    . fexofenadine (ALLEGRA) 180 MG tablet Take 180 mg by mouth daily as needed for allergies.     [name] glucose blood (ACCU-CHEK AVIVA PLUS) test strip Use as instructed to test blood sugar daily ICD 10 E11 51 100 each 3  . HYDROcodone-acetaminophen (NORCO) 7.5-325 MG tablet Take 1 tablet by mouth every 6 (six) hours as needed (for chronic joint pain). 30 tablet 0  . ibuprofen (ADVIL,MOTRIN) 800 MG tablet Take 800 mg by mouth every 8 (eight) hours as needed for headache or moderate pain.     Elmore Guise Devices (AUTOLET IMPRESSION) MISC Use as instructed to test blood sugar ICD 10 E11 51 1 each 3  . levalbuterol (XOPENEX) 1.25 MG/3ML nebulizer solution Take 1.25 mg by nebulization every 4 (four) hours as needed for wheezing or shortness of breath.    . lisinopril-hydrochlorothiazide (PRINZIDE,ZESTORETIC) 20-12.5 MG tablet Take 1 tablet by mouth daily.  90 tablet 3  . LORazepam (ATIVAN) 1 MG tablet Take 0.5-1 mg by mouth at bedtime as needed for sleep.    [name] LORazepam (ATIVAN) 1 MG tablet TAKE 1/2 TO 1 TABLET BY MOUTH DAILY AS NEEDED -- Office visit needed for further refills 30 tablet 0  . metFORMIN (GLUCOPHAGE) 500 MG tablet Take 1 tablet (500 mg total) by mouth daily with breakfast. 30 tablet 0  . metFORMIN (GLUCOPHAGE) 500 MG tablet Take 1 tablet (500 mg total) by mouth daily with breakfast. Needs appointment for more refills. 30 tablet 0  . montelukast (SINGULAIR) 10 MG tablet TAKE 1 TABLET BY MOUTH   DAILY 90 tablet 2  . nebivolol (BYSTOLIC) 5 MG tablet Take 1 tablet (5 mg total) by mouth daily. (Patient taking differently: Take 2.5 mg by mouth daily. ) 30 tablet 11  . nitroGLYCERIN (NITROSTAT) 0.4 MG SL tablet Place 0.4 mg under the tongue every 5 (five) minutes x 3 doses as needed for chest pain.    . rosuvastatin (CRESTOR) 5 MG tablet Take 5 mg by mouth every Monday, Wednesday, and Friday.    . vardenafil (LEVITRA) 20 MG tablet TAKE 1 TABLET BY MOUTH AS DIRECTED. CAN NOT BE TAKEN WITH NITROGLYCERIN. (Patient taking differently: Take 20 mg by mouth daily as needed for erectile dysfunction (Cannot be taken with Nitroglycerin). ) 6 tablet 2   No current facility-administered medications on file prior to visit.     Past Medical History:  Diagnosis Date  . Arthritis    "thumbs" (07/25/2018)  . Asthma   . CAD (coronary artery disease)   . CHF (congestive heart failure) ([place])   . Chronic bronchitis ([place])   . Environmental allergies   . GERD (gastroesophageal reflux disease)   . Headache    "seasonal; w/environmental allergies" (07/25/2018)  . History of blood transfusion    "when I had laminectomy" (07/25/2018)  . HTN (hypertension)   . Hyperlipidemia   . LBBB (left bundle branch block) 1999  . Myocardial infarction [facility])    "was told I've had an old MI; probably in the 94s" (07/25/2018)  . Pneumonia    "as a child, age 7; viral pneumonia 3 times in the last 10 years" (07/25/2018)  . Presence of permanent cardiac pacemaker 07/25/2018  . Seasonal allergies   . Sleep apnea    "wife says I do" (07/25/2018)  . Type II diabetes mellitus ([place])     Past Surgical History:  Procedure Laterality Date  . BACK SURGERY    . BIV PACEMAKER INSERTION CRT-P  07/25/2018  . BIV PACEMAKER INSERTION CRT-P N/A 07/25/2018   Procedure: BIV PACEMAKER INSERTION CRT-P;  Surgeon: Evans Lance, MD;  Location: [facility];  Service: Cardiovascular;  Laterality: N/A;  . CARDIAC CATHETERIZATION   2003   Dr Pernell Dupre; 85 % R circumflex obstruction  . CARPAL TUNNEL RELEASE Left 10/11/2014   Procedure: LEFT CARPAL TUNNEL RELEASE;  Surgeon: Roseanne Kaufman, MD;  Location: [facility];  Service: Orthopedics;  Laterality: Left;  . COLONOSCOPY W/ BIOPSIES AND POLYPECTOMY  2018  . INGUINAL HERNIA REPAIR Right 1948  . INGUINAL HERNIA REPAIR Left 1988  . LUMBAR LAMINECTOMY  1984  . MINOR CARPAL TUNNEL Right 11/22/2014   Procedure: RIGHT LIMITED OPEN CARPAL TUNNEL RELEASE;  Surgeon: Roseanne Kaufman, MD;  Location: [facility];  Service: Orthopedics;  Laterality: Right;  . TONSILLECTOMY  1958    Social History   Socioeconomic History  . Marital status:  Married    Spouse name: Not on file  . Number of children: Not on file  . Years of education: Not on file  . Highest education level: Not on file  Occupational History  . Not on file  Social Needs  . Financial resource strain: Not on file  . Food insecurity:    Worry: Not on file    Inability: Not on file  . Transportation needs:    Medical: Not on file    Non-medical: Not on file  Tobacco Use  . Smoking status: Former Smoker    Packs/day: 2.00    Years: 2.00    Pack years: 4.00    Types: Cigarettes    Last attempt to quit: 12/14/1963    Years since quitting: 54.7  . Smokeless tobacco: Never Used  Substance and Sexual Activity  . Alcohol use: Yes    Alcohol/week: 2.0 standard drinks    Types: 2 Glasses of wine per week  . Drug use: Never  . Sexual activity: Yes  Lifestyle  . Physical activity:    Days per week: Not on file    Minutes per session: Not on file  . Stress: Not on file  Relationships  . Social connections:    Talks on phone: Not on file    Gets together: Not on file    Attends religious service: Not on file    Active member of club or organization: Not on file    Attends meetings of clubs or organizations: Not on file    Relationship status: Not on file  Other Topics Concern    . Not on file  Social History Narrative  . Not on file    Family History  Problem Relation Age of Onset  . Heart attack Father        6s  . Colon polyps Father   . Heart failure Mother        90s  . Subarachnoid hemorrhage Mother 43  . Hypertension Mother   . Subarachnoid hemorrhage Paternal Grandfather 29  . Diabetes Maternal Aunt   . Colon cancer Neg Hx     Review of Systems  Constitutional: Negative for fever.  Skin: Positive for rash. Negative for wound.       Objective:   Vitals:   08/31/18 1030  BP: 134/70  Pulse: 61  Resp: 16  Temp: 97.7 F (36.5 C)  SpO2: 98%   BP Readings from Last 3 Encounters:  08/31/18 134/70  07/26/18 127/68  07/13/18 120/68   Wt Readings from Last 3 Encounters:  08/31/18 191 lb (86.6 kg)  07/26/18 187 lb 6.3 oz (85 kg)  07/13/18 192 lb 3.2 oz (87.2 kg)   Body mass index is 28.21 kg/m.   Physical Exam  Constitutional: He appears well-developed and well-nourished. No distress.  HENT:  Head: Normocephalic and atraumatic.  Skin: Rash (Upper central chest only, raised macular papular, couple lesions with excoriation from scratching; no blisters, no open wounds) noted. He is not diaphoretic.  Psychiatric: He has a normal mood and affect. His behavior is normal. Judgment and thought content normal.           Assessment & Plan:    See Problem List for Assessment and Plan of chronic medical problems.

## 2018-08-31 NOTE — Assessment & Plan Note (Addendum)
Takes 1/2-1 mg of Ativan only as needed at bedtime Medication effective and without side effects Continue

## 2018-08-31 NOTE — Assessment & Plan Note (Signed)
Verbal folliculitis We will try Lotrisone cream twice daily If no improvement he will let me know Can see dermatology if needed

## 2018-08-31 NOTE — Patient Instructions (Signed)
Use the cream twice daily - if there is no improvement let me know.

## 2018-09-01 ENCOUNTER — Encounter: Payer: Self-pay | Admitting: Internal Medicine

## 2018-09-01 NOTE — Telephone Encounter (Signed)
Last refill was 05/29/18.

## 2018-09-04 MED ORDER — HYDROCODONE-ACETAMINOPHEN 7.5-325 MG PO TABS: 1.0000 | ORAL_TABLET | Freq: Four times a day (QID) | ORAL | 0 refills | Status: DC | PRN

## 2018-09-05 ENCOUNTER — Ambulatory Visit: Payer: Medicare Other | Admitting: Internal Medicine

## 2018-09-09 ENCOUNTER — Other Ambulatory Visit: Payer: Self-pay | Admitting: Internal Medicine

## 2018-09-11 NOTE — Telephone Encounter (Signed)
[place] Controlled Substance Database checked. Last filled on 08/07/18

## 2018-09-27 ENCOUNTER — Other Ambulatory Visit: Payer: Self-pay | Admitting: Internal Medicine

## 2018-10-08 ENCOUNTER — Other Ambulatory Visit: Payer: Self-pay | Admitting: Internal Medicine

## 2018-10-09 MED ORDER — LORAZEPAM 1 MG PO TABS: ORAL_TABLET | ORAL | 0 refills | Status: DC

## 2018-10-09 NOTE — Telephone Encounter (Signed)
Last refill was 09/11/18 Last OV was 08/31/2018 Next OV is not on file

## 2018-10-12 ENCOUNTER — Ambulatory Visit: Payer: Self-pay

## 2018-10-12 ENCOUNTER — Encounter: Payer: Self-pay | Admitting: Family Medicine

## 2018-10-12 ENCOUNTER — Ambulatory Visit: Payer: Medicare Other | Admitting: Family Medicine

## 2018-10-12 VITALS — BP 124/74 | HR 72 | Temp 97.6°F | Ht 69.0 in | Wt 194.0 lb

## 2018-10-12 DIAGNOSIS — J209 Acute bronchitis, unspecified: Secondary | ICD-10-CM

## 2018-10-12 MED ORDER — PREDNISONE 10 MG PO TABS: ORAL_TABLET | ORAL | 0 refills | Status: DC

## 2018-10-12 MED ORDER — DOXYCYCLINE HYCLATE 100 MG PO TABS: 100.0000 mg | ORAL_TABLET | Freq: Two times a day (BID) | ORAL | 0 refills | Status: DC

## 2018-10-12 MED ORDER — HYDROCODONE-HOMATROPINE 5-1.5 MG/5ML PO SYRP: 5.0000 mL | ORAL_SOLUTION | Freq: Three times a day (TID) | ORAL | 0 refills | Status: DC | PRN

## 2018-10-12 NOTE — Patient Instructions (Signed)
It was great seeing you today.  I have sent in a prescription for prednisone and antibiotic if symptoms are not improving with prednisone and conservative measures.  Use the cough syrup at night.   During the day, Delsym and/or Mucinex is best.      Acute Bronchitis Bronchitis is inflammation of the airways that extend from the windpipe into the lungs (bronchi). The inflammation often causes mucus to develop. This leads to a cough, which is the most common symptom of bronchitis.  In acute bronchitis, the condition usually develops suddenly and goes away over time, usually in a couple weeks. Smoking, allergies, and asthma can make bronchitis worse. Repeated episodes of bronchitis may cause further lung problems.  CAUSES Acute bronchitis is most often caused by the same virus that causes a cold. The virus can spread from person to person (contagious) through coughing, sneezing, and touching contaminated objects. SIGNS AND SYMPTOMS   Cough.   Fever.   Coughing up mucus.   Body aches.   Chest congestion.   Chills.   Shortness of breath.   Sore throat.  DIAGNOSIS  Acute bronchitis is usually diagnosed through a physical exam. Your health care provider will also ask you questions about your medical history. Tests, such as chest X-rays, are sometimes done to rule out other conditions.  TREATMENT  Acute bronchitis usually goes away in a couple weeks. Oftentimes, no medical treatment is necessary. Medicines are sometimes given for relief of fever or cough. Antibiotic medicines are usually not needed but may be prescribed in certain situations. In some cases, an inhaler may be recommended to help reduce shortness of breath and control the cough. A cool mist vaporizer may also be used to help thin bronchial secretions and make it easier to clear the chest.  HOME CARE INSTRUCTIONS  Get plenty of rest.   Drink enough fluids to keep your urine clear or pale yellow (unless you have  a medical condition that requires fluid restriction). Increasing fluids may help thin your respiratory secretions (sputum) and reduce chest congestion, and it will prevent dehydration.   Take medicines only as directed by your health care provider.  If you were prescribed an antibiotic medicine, finish it all even if you start to feel better.  Avoid smoking and secondhand smoke. Exposure to cigarette smoke or irritating chemicals will make bronchitis worse. If you are a smoker, consider using nicotine gum or skin patches to help control withdrawal symptoms. Quitting smoking will help your lungs heal faster.   Reduce the chances of another bout of acute bronchitis by washing your hands frequently, avoiding people with cold symptoms, and trying not to touch your hands to your mouth, nose, or eyes.   Keep all follow-up visits as directed by your health care provider.  SEEK MEDICAL CARE IF: Your symptoms do not improve after 1 week of treatment.  SEEK IMMEDIATE MEDICAL CARE IF:  You develop an increased fever or chills.   You have chest pain.   You have severe shortness of breath.  You have bloody sputum.   You develop dehydration.  You faint or repeatedly feel like you are going to pass out.  You develop repeated vomiting.  You develop a severe headache. MAKE SURE YOU:   Understand these instructions.  Will watch your condition.  Will get help right away if you are not doing well or get worse.   This information is not intended to replace advice given to you by your health care provider. Make  sure you discuss any questions you have with your health care provider.   Document Released: 01/06/2005 Document Revised: 12/20/2014 Document Reviewed: 05/22/2013 Elsevier Interactive Patient Education [employer].

## 2018-10-12 NOTE — Progress Notes (Signed)
Patient ID: Jeffery [name], male   DOB: Jun 11, 1941, 77 y.o.   MRN: [id number]  PCP: Jeffery Rail, MD  Subjective:  Jeffery [name] is a 77 y.o. year old very pleasant male patient who presents with  symptoms including nasal congestion,rhnitis, cough, chest congestion.  - does have wheeze as well -started: 2 days ago , symptoms are worsening -previous treatments: Allegra, Mucinex have provided limited benefit -sick contacts/travel/risks: denies flu exposure. Recent sick contact with small grandchildren Fasting blood sugar this morning 108. He does not need a rescue inhaler unless he is exposed to sick contacts and symptoms start.  Former smoker with quit date 12/14/1963.  ROS-denies fever, NVD, tooth pain. Denies significant shortness of breath.   Pertinent Past Medical History- Asthma, T2DM, Chronic combined systolic and diastolic CHF  Patient Active Problem List   Diagnosis Date Noted  . Folliculitis 84/13/2440  . Chronic systolic heart failure ([place]) 07/25/2018  . Anxiety 03/26/2018  . Metatarsal bone fracture 03/07/2018  . Encounter for chronic pain management 04/13/2017  . Osteoarthritis 04/12/2017  . Chronic combined systolic and diastolic CHF (congestive heart failure) ([place]) 01/24/2015  . OSA (obstructive sleep apnea) 10/03/2014  . Left bundle branch block 11/27/2013  . Essential hypertension 11/27/2013  . GERD (gastroesophageal reflux disease) 02/27/2013  . Type 2 diabetes mellitus with vascular disease ([place]) 04/09/2009  . Coronary atherosclerosis 09/06/2008  . Asthma 09/06/2008    Medications- reviewed  Current Outpatient Medications  Medication Sig Dispense Refill  . ACCU-CHEK SOFTCLIX LANCETS lancets Use to test blood sugar daily as directed. 100 each 3  . albuterol (PROAIR HFA) 108 (90 BASE) MCG/ACT inhaler Inhale 2 puffs into the lungs every 6 (six) hours as needed for wheezing or shortness of breath.     [name] aspirin 81 MG tablet Take 81 mg by mouth at bedtime.       . Blood Glucose Calibration (ACCU-CHEK AVIVA) SOLN Use as instructed to test blood sugar ICD 10 E11 51 1 each 3  . Blood Glucose Monitoring Suppl (ACCU-CHEK AVIVA PLUS) W/DEVICE KIT Use as directed 1 kit 0  . clotrimazole-betamethasone (LOTRISONE) cream Apply 1 application topically 2 (two) times daily. 30 g 0  . esomeprazole (NEXIUM 24HR) 20 MG capsule Take 20 mg by mouth daily.    . fexofenadine (ALLEGRA) 180 MG tablet Take 180 mg by mouth daily as needed for allergies.     [name] glucose blood (ACCU-CHEK AVIVA PLUS) test strip Use as instructed to test blood sugar daily ICD 10 E11 51 100 each 3  . HYDROcodone-acetaminophen (NORCO) 7.5-325 MG tablet Take 1 tablet by mouth every 6 (six) hours as needed (for chronic joint pain). 30 tablet 0  . ibuprofen (ADVIL,MOTRIN) 800 MG tablet Take 800 mg by mouth every 8 (eight) hours as needed for headache or moderate pain.     Elmore Guise Devices (AUTOLET IMPRESSION) MISC Use as instructed to test blood sugar ICD 10 E11 51 1 each 3  . levalbuterol (XOPENEX) 1.25 MG/3ML nebulizer solution Take 1.25 mg by nebulization every 4 (four) hours as needed for wheezing or shortness of breath.    . lisinopril-hydrochlorothiazide (PRINZIDE,ZESTORETIC) 20-12.5 MG tablet Take 1 tablet by mouth daily. 90 tablet 3  . LORazepam (ATIVAN) 1 MG tablet Take 0.5-1 mg by mouth at bedtime as needed for sleep.    [name] LORazepam (ATIVAN) 1 MG tablet TAKE 1/2 TO 1 TABLET BY MOUTH DAILY AS NEEDED 30 tablet 0  . metFORMIN (GLUCOPHAGE) 500 MG tablet Take 1  tablet (500 mg total) by mouth daily with breakfast. Needs appointment for more refills. 30 tablet 0  . montelukast (SINGULAIR) 10 MG tablet TAKE 1 TABLET BY MOUTH  DAILY 90 tablet 2  . nebivolol (BYSTOLIC) 5 MG tablet Take 1 tablet (5 mg total) by mouth daily. (Patient taking differently: Take 2.5 mg by mouth daily. ) 30 tablet 11  . nitroGLYCERIN (NITROSTAT) 0.4 MG SL tablet Place 0.4 mg under the tongue every 5 (five) minutes x 3 doses as  needed for chest pain.    . rosuvastatin (CRESTOR) 5 MG tablet Take 5 mg by mouth every Monday, Wednesday, and Friday.    . SYMBICORT 160-4.5 MCG/ACT inhaler INHALE 1 TO 2 PUFFS INTO  THE LUNGS EVERY 12 HOURS AS NEEDED 30.6 g 1  . vardenafil (LEVITRA) 20 MG tablet TAKE 1 TABLET BY MOUTH AS DIRECTED. CAN NOT BE TAKEN WITH NITROGLYCERIN. (Patient taking differently: Take 20 mg by mouth daily as needed for erectile dysfunction (Cannot be taken with Nitroglycerin). ) 6 tablet 2   No current facility-administered medications for this visit.     Objective: BP 124/74   Pulse 72   Temp 97.6 F (36.4 C) (Oral)   Ht 5' 9"  (1.753 m)   Wt 194 lb (88 kg)   SpO2 96%   BMI 28.65 kg/m  Gen: NAD, resting comfortably HEENT: Turbinates mildly erythematous, TMs normal bilaterally, oropharynx is clear and moist; no sinus tenderness CV: RRR no murmurs rubs or gallops Lungs: Scattered wheezing present. No crackles or rhonchi Ext: no edema Skin: warm, dry, no rash  Assessment/Plan:  Bronchitis Patient's symptoms most consistent with bronchitis. Discussed likely viral nature and less likely bacterial cause. Scattered wheezing present. VSS and lung exam make pneumonia unlikely. Discussed possible treatment options and we'll treat with a prednisone taper. Also provided with a prescription for doxycycline to take if not improved in the next 2-3 days due to previous history of similar symptoms. He is motivated to try prednisone taper first and will avoid antibiotic if improved. He will continue fexofenadine and symbicort as prescribed by PCP. He also carries his rescue inhaler if needed but he has not needed it at this time. He also has Xopenex and we reviewed use of this medication.   Advised that if his symptoms were to worsen he should let us know. He was provided with cough suppressant as well. He is given return precautions.   We discussed that we did not find any infection that had higher probability of  being bacterial such as pneumonia, strep throat, ear infection, bacterial sinusitis. We discussed signs that bacterial infection may have developed particularly fever or shortness of breath and reasons for follow up (symptoms worsen, last past expected time frame, new concerns arise).    Laurita Quint, FNP

## 2018-10-12 NOTE — Telephone Encounter (Addendum)
Pt. Reports he started with a productive cough this week. Has a history of asthma. No fever. Has a inspiratory wheeze. Used his nebulizer last night and that helped. Cough is disruptive. Appointment made for today. No availability with Dr. Quay Burow.  Reason for Disposition . [1] Continuous (nonstop) coughing interferes with work or school AND [2] no improvement using cough treatment per Care Advice  Answer Assessment - Initial Assessment Questions 1. ONSET: "When did the cough begin?"      Started last week 2. SEVERITY: "How bad is the cough today?"      Moderate 3. RESPIRATORY DISTRESS: "Describe your breathing."      No 4. FEVER: "Do you have a fever?" If so, ask: "What is your temperature, how was it measured, and when did it start?"     No 5. SPUTUM: "Describe the color of your sputum" (clear, white, yellow, green)      Unsure 6. HEMOPTYSIS: "Are you coughing up any blood?" If so ask: "How much?" (flecks, streaks, tablespoons, etc.)     No 7. CARDIAC HISTORY: "Do you have any history of heart disease?" (e.g., heart attack, congestive heart failure)      Recent pacemaker 8. LUNG HISTORY: "Do you have any history of lung disease?"  (e.g., pulmonary embolus, asthma, emphysema)     Asthma 9. PE RISK FACTORS: "Do you have a history of blood clots?" (or: recent major surgery, recent prolonged travel, bedridden)     No 10. OTHER SYMPTOMS: "Do you have any other symptoms?" (e.g., runny nose, wheezing, chest pain)       Wheezing 11. PREGNANCY: "Is there any chance you are pregnant?" "When was your last menstrual period?"       n/a 12. TRAVEL: "Have you traveled out of the country in the last month?" (e.g., travel history, exposures)       No  Protocols used: [place]

## 2018-10-16 ENCOUNTER — Encounter: Payer: Self-pay | Admitting: Podiatry

## 2018-10-16 ENCOUNTER — Other Ambulatory Visit: Payer: Self-pay | Admitting: Interventional Cardiology

## 2018-10-17 ENCOUNTER — Other Ambulatory Visit: Payer: Self-pay | Admitting: Internal Medicine

## 2018-10-17 MED ORDER — LEVALBUTEROL HCL 1.25 MG/3ML IN NEBU: 1.2500 mg | INHALATION_SOLUTION | RESPIRATORY_TRACT | 2 refills | Status: DC | PRN

## 2018-10-17 NOTE — Telephone Encounter (Signed)
Copied from [place] [id number]. Topic: General - Other >> Oct 17, 2018  3:37 PM Janace Aris A wrote: Medication: levalbuterol (XOPENEX) 1.25 MG/3ML nebulizer solution  Has the patient contacted their pharmacy? Yes   Preferred Pharmacy (with phone number or street name): [facility], [place] [address]  [phone] (Phone) [phone] (Fax)    Agent: Please be advised that RX refills may take up to 3 business days. We ask that you follow-up with your pharmacy.

## 2018-10-17 NOTE — Telephone Encounter (Signed)
Not previously prescribed by Quay Burow, Please advise

## 2018-10-17 NOTE — Telephone Encounter (Signed)
Requested medication (s) are due for refill today: yes  Requested medication (s) are on the active medication list: yes  Last refill:  Filled by historical provider previously  Future visit scheduled: No  Notes to clinic:  [facility] on 10/12/18. Medication previously filled by historical provider    Requested Prescriptions  Pending Prescriptions Disp Refills   levalbuterol (XOPENEX) 1.25 MG/3ML nebulizer solution 72 mL     Sig: Take 1.25 mg by nebulization every 4 (four) hours as needed for wheezing or shortness of breath.     Pulmonology:  Beta Agonists Failed - 10/17/2018  3:48 PM      Failed - One inhaler should last at least one month. If the patient is requesting refills earlier, contact the patient to check for uncontrolled symptoms.      Passed - Valid encounter within last 12 months    Recent Outpatient Visits          5 days ago Acute bronchitis, unspecified organism   [place], [place]   1 month ago [place], [place]   6 months ago Encounter for chronic pain management   [place], MD   7 months ago Left foot pain   [place], Olevia Bowens, DO   1 year ago [facility] [employer] at [facility] [employer], [place], [place]

## 2018-10-18 ENCOUNTER — Ambulatory Visit: Payer: Medicare Other | Admitting: Podiatry

## 2018-10-18 ENCOUNTER — Encounter: Payer: Self-pay | Admitting: Podiatry

## 2018-10-18 ENCOUNTER — Encounter: Payer: Self-pay | Admitting: Internal Medicine

## 2018-10-18 DIAGNOSIS — M2042 Other hammer toe(s) (acquired), left foot: Secondary | ICD-10-CM | POA: Diagnosis not present

## 2018-10-18 DIAGNOSIS — L84 Corns and callosities: Secondary | ICD-10-CM

## 2018-10-18 NOTE — Patient Instructions (Signed)
Pre-Operative Instructions  Congratulations, you have decided to take an important step towards improving your quality of life.  You can be assured that the doctors and staff at [facility] will be with you every step of the way.  Here are some important things you should know:  1. Plan to be at the surgery center/hospital at least 1 (one) hour prior to your scheduled time, unless otherwise directed by the surgical center/hospital staff.  You must have a responsible adult accompany you, remain during the surgery and drive you home.  Make sure you have directions to the surgical center/hospital to ensure you arrive on time. 2. If you are having surgery at [facility] or  hospitals, you will need a copy of your medical history and physical form from your family physician within one month prior to the date of surgery. We will give you a form for your primary physician to complete.  3. We make every effort to accommodate the date you request for surgery.  However, there are times where surgery dates or times have to be moved.  We will contact you as soon as possible if a change in schedule is required.   4. No aspirin/ibuprofen for one week before surgery.  If you are on aspirin, any non-steroidal anti-inflammatory medications (Mobic, Aleve, Ibuprofen) should not be taken seven (7) days prior to your surgery.  You make take Tylenol for pain prior to surgery.  5. Medications - If you are taking daily heart and blood pressure medications, seizure, reflux, allergy, asthma, anxiety, pain or diabetes medications, make sure you notify the surgery center/hospital before the day of surgery so they can tell you which medications you should take or avoid the day of surgery. 6. No food or drink after midnight the night before surgery unless directed otherwise by surgical center/hospital staff. 7. No alcoholic beverages 24-hours prior to surgery.  No smoking 24-hours prior or 24-hours after  surgery. 8. Wear loose pants or shorts. They should be loose enough to fit over bandages, boots, and casts. 9. Don't wear slip-on shoes. Sneakers are preferred. 10. Bring your boot with you to the surgery center/hospital.  Also bring crutches or a walker if your physician has prescribed it for you.  If you do not have this equipment, it will be provided for you after surgery. 11. If you have not been contacted by the surgery center/hospital by the day before your surgery, call to confirm the date and time of your surgery. 12. Leave-time from work may vary depending on the type of surgery you have.  Appropriate arrangements should be made prior to surgery with your employer. 13. Prescriptions will be provided immediately following surgery by your doctor.  Fill these as soon as possible after surgery and take the medication as directed. Pain medications will not be refilled on weekends and must be approved by the doctor. 14. Remove nail polish on the operative foot and avoid getting pedicures prior to surgery. 15. Wash the night before surgery.  The night before surgery wash the foot and leg well with water and the antibacterial soap provided. Be sure to pay special attention to beneath the toenails and in between the toes.  Wash for at least three (3) minutes. Rinse thoroughly with water and dry well with a towel.  Perform this wash unless told not to do so by your physician.  Enclosed: 1 Ice pack (please put in freezer the night before surgery)   1 Hibiclens skin cleaner     Pre-op instructions  If you have any questions regarding the instructions, please do not hesitate to call our office.  [place]: [address], [place], [place] [address] -- [phone]  [place]: [address] [place], [place] [address] -- [phone]  [place]: [address]  [place], [place] [address] -- [phone]  [place]: [address], [address], [place],  [address] -- [phone]  Website:  [email or web address] 

## 2018-10-19 MED ORDER — ALBUTEROL SULFATE (2.5 MG/3ML) 0.083% IN NEBU: 2.5000 mg | INHALATION_SOLUTION | Freq: Four times a day (QID) | RESPIRATORY_TRACT | 5 refills | Status: DC | PRN

## 2018-10-22 NOTE — Progress Notes (Signed)
Subjective:   Patient ID: Jeffery [name], male   DOB: 77 y.o.   MRN: [id number]   HPI Patient presents stating a lesion under his foot is not improving becoming worse and he is looking for a long-term solution to the problem.  Also concerned about right toenail thickness   ROS      Objective:  Physical Exam  Neurovascular status intact with patient found to have a plantar keratotic lesion digit 3 left which is remained chronically sore and has not responded to conservative treatment that we have attempted     Assessment:  Chronic digital deformity with plantar keratotic lesion with fluid buildup and pain     Plan:  H&P condition reviewed discussed.  At this point I recommended plantar arthroplasty along with excision of mass with pathology.  I explained there is no guarantee this will solve the problem and that it still may recur or there may be other issues we will have to deal with in the future.  Patient wants surgery and signed consent form after extensive review of alternative treatments complications and understands recovery can take approximately 6 months with swelling a big part of the recovery process.  Patient scheduled outpatient surgery and is encouraged to call with questions

## 2018-10-25 ENCOUNTER — Encounter: Payer: Medicare Other | Admitting: Internal Medicine

## 2018-10-25 ENCOUNTER — Other Ambulatory Visit: Payer: Self-pay | Admitting: Internal Medicine

## 2018-10-26 MED ORDER — HYDROCODONE-ACETAMINOPHEN 7.5-325 MG PO TABS: 1.0000 | ORAL_TABLET | Freq: Four times a day (QID) | ORAL | 0 refills | Status: DC | PRN

## 2018-10-26 NOTE — Progress Notes (Signed)
Subjective:    Patient ID: Jeffery [name], male    DOB: Feb 09, 1941, 77 y.o.   MRN: [id number]  HPI He is here for an acute visit for asthma symptoms.  He saw Almyra Free on 10/31.  He was diagnosed with bronchitis and asthma exacerbation.  He was prescribed doxycycline, prednisone and hycodan.  He thinks the prednisone may have helped a very small amount, but not much.  He is unsure if the doxycycline did anything or was even needed.  He never filled the Hycodan cough syrup.  His symptoms started 2.5 weeks ago.  At this point he thinks most of his symptoms are related to his asthma.  Years ago he has had episodes of severe asthma exacerbations similar to this and required an extended course of steroids.  He is experiencing wheezing, chest tightness and cough.  The cough is productive at times, but he thinks it is most likely clear.  He has rhinorrhea.  He denies fevers, nasal congestion, ear pain, sinus pain and sore throat.  He is using his Xopenex nebulizer treatments or taking his albuterol every 6 hours.  He is using the Symbicort twice a day.  Symptoms are not improving.   Medications and allergies reviewed with patient and updated if appropriate.  Patient Active Problem List   Diagnosis Date Noted  . Folliculitis 17/61/6073  . Chronic systolic heart failure ([place]) 07/25/2018  . Anxiety 03/26/2018  . Metatarsal bone fracture 03/07/2018  . Encounter for chronic pain management 04/13/2017  . Osteoarthritis 04/12/2017  . Chronic combined systolic and diastolic CHF (congestive heart failure) ([place]) 01/24/2015  . OSA (obstructive sleep apnea) 10/03/2014  . Left bundle branch block 11/27/2013  . Essential hypertension 11/27/2013  . GERD (gastroesophageal reflux disease) 02/27/2013  . Type 2 diabetes mellitus with vascular disease ([place]) 04/09/2009  . Coronary atherosclerosis 09/06/2008  . Asthma 09/06/2008    Current Outpatient Medications on File Prior to Visit  Medication Sig  Dispense Refill  . ACCU-CHEK SOFTCLIX LANCETS lancets Use to test blood sugar daily as directed. 100 each 3  . albuterol (PROAIR HFA) 108 (90 BASE) MCG/ACT inhaler Inhale 2 puffs into the lungs every 6 (six) hours as needed for wheezing or shortness of breath.     [name] albuterol (PROVENTIL) (2.5 MG/3ML) 0.083% nebulizer solution Take 3 mLs (2.5 mg total) by nebulization every 6 (six) hours as needed for wheezing or shortness of breath. 150 mL 5  . aspirin 81 MG tablet Take 81 mg by mouth at bedtime.      . Blood Glucose Calibration (ACCU-CHEK AVIVA) SOLN Use as instructed to test blood sugar ICD 10 E11 51 1 each 3  . Blood Glucose Monitoring Suppl (ACCU-CHEK AVIVA PLUS) W/DEVICE KIT Use as directed 1 kit 0  . clotrimazole-betamethasone (LOTRISONE) cream Apply 1 application topically 2 (two) times daily. 30 g 0  . doxycycline (VIBRA-TABS) 100 MG tablet Take 1 tablet (100 mg total) by mouth 2 (two) times daily. 20 tablet 0  . esomeprazole (NEXIUM 24HR) 20 MG capsule Take 20 mg by mouth daily.    . fexofenadine (ALLEGRA) 180 MG tablet Take 180 mg by mouth daily as needed for allergies.     [name] glucose blood (ACCU-CHEK AVIVA PLUS) test strip Use as instructed to test blood sugar daily ICD 10 E11 51 100 each 3  . HYDROcodone-acetaminophen (NORCO) 7.5-325 MG tablet Take 1 tablet by mouth every 6 (six) hours as needed (for chronic joint pain). 30 tablet 0  .  HYDROcodone-homatropine (HYCODAN) 5-1.5 MG/5ML syrup Take 5 mLs by mouth every 8 (eight) hours as needed for cough. 120 mL 0  . ibuprofen (ADVIL,MOTRIN) 800 MG tablet Take 800 mg by mouth every 8 (eight) hours as needed for headache or moderate pain.     Elmore Guise Devices (AUTOLET IMPRESSION) MISC Use as instructed to test blood sugar ICD 10 E11 51 1 each 3  . levalbuterol (XOPENEX) 1.25 MG/3ML nebulizer solution Take 1.25 mg by nebulization every 4 (four) hours as needed for wheezing or shortness of breath. 72 mL 2  . lisinopril-hydrochlorothiazide  (PRINZIDE,ZESTORETIC) 20-12.5 MG tablet TAKE 1 TABLET BY MOUTH  DAILY 90 tablet 0  . LORazepam (ATIVAN) 1 MG tablet Take 0.5-1 mg by mouth at bedtime as needed for sleep.    [name] LORazepam (ATIVAN) 1 MG tablet TAKE 1/2 TO 1 TABLET BY MOUTH DAILY AS NEEDED 30 tablet 0  . metFORMIN (GLUCOPHAGE) 500 MG tablet Take 1 tablet (500 mg total) by mouth daily with breakfast. Needs appointment for more refills. 30 tablet 0  . montelukast (SINGULAIR) 10 MG tablet TAKE 1 TABLET BY MOUTH  DAILY 90 tablet 2  . nebivolol (BYSTOLIC) 5 MG tablet Take 1 tablet (5 mg total) by mouth daily. (Patient taking differently: Take 2.5 mg by mouth daily. ) 30 tablet 11  . nitroGLYCERIN (NITROSTAT) 0.4 MG SL tablet Place 0.4 mg under the tongue every 5 (five) minutes x 3 doses as needed for chest pain.    . predniSONE (DELTASONE) 10 MG tablet Take 4 tablets daily for 4 days, 3 tabs daily for 2 days, 2 tabs daily for 2 days, and one tab daily for 2 days. 28 tablet 0  . rosuvastatin (CRESTOR) 5 MG tablet TAKE 1 TABLET BY MOUTH  EVERY MONDAY, WEDNESDAY,  AND FRIDAY 45 tablet 0  . SYMBICORT 160-4.5 MCG/ACT inhaler INHALE 1 TO 2 PUFFS INTO  THE LUNGS EVERY 12 HOURS AS NEEDED 30.6 g 1  . vardenafil (LEVITRA) 20 MG tablet TAKE 1 TABLET BY MOUTH AS DIRECTED. CAN NOT BE TAKEN WITH NITROGLYCERIN. (Patient taking differently: Take 20 mg by mouth daily as needed for erectile dysfunction (Cannot be taken with Nitroglycerin). ) 6 tablet 2   No current facility-administered medications on file prior to visit.     Past Medical History:  Diagnosis Date  . Arthritis    "thumbs" (07/25/2018)  . Asthma   . CAD (coronary artery disease)   . CHF (congestive heart failure) ([place])   . Chronic bronchitis ([place])   . Environmental allergies   . GERD (gastroesophageal reflux disease)   . Headache    "seasonal; w/environmental allergies" (07/25/2018)  . History of blood transfusion    "when I had laminectomy" (07/25/2018)  . HTN (hypertension)   .  Hyperlipidemia   . LBBB (left bundle branch block) 1999  . Myocardial infarction [facility])    "was told I've had an old MI; probably in the 76s" (07/25/2018)  . Pneumonia    "as a child, age 87; viral pneumonia 3 times in the last 10 years" (07/25/2018)  . Presence of permanent cardiac pacemaker 07/25/2018  . Seasonal allergies   . Sleep apnea    "wife says I do" (07/25/2018)  . Type II diabetes mellitus ([place])     Past Surgical History:  Procedure Laterality Date  . BACK SURGERY    . BIV PACEMAKER INSERTION CRT-P  07/25/2018  . BIV PACEMAKER INSERTION CRT-P N/A 07/25/2018   Procedure: BIV PACEMAKER INSERTION CRT-P;  Surgeon: Evans Lance, MD;  Location: [place] [facility];  Service: Cardiovascular;  Laterality: N/A;  . CARDIAC CATHETERIZATION  2003   Dr Pernell Dupre; 85 % R circumflex obstruction  . CARPAL TUNNEL RELEASE Left 10/11/2014   Procedure: LEFT CARPAL TUNNEL RELEASE;  Surgeon: Roseanne Kaufman, MD;  Location: [facility] [place];  Service: Orthopedics;  Laterality: Left;  . COLONOSCOPY W/ BIOPSIES AND POLYPECTOMY  2018  . INGUINAL HERNIA REPAIR Right 1948  . INGUINAL HERNIA REPAIR Left 1988  . LUMBAR LAMINECTOMY  1984  . MINOR CARPAL TUNNEL Right 11/22/2014   Procedure: RIGHT LIMITED OPEN CARPAL TUNNEL RELEASE;  Surgeon: Roseanne Kaufman, MD;  Location: [place];  Service: Orthopedics;  Laterality: Right;  . TONSILLECTOMY  1958    Social History   Socioeconomic History  . Marital status: Married    Spouse name: Not on file  . Number of children: Not on file  . Years of education: Not on file  . Highest education level: Not on file  Occupational History  . Not on file  Social Needs  . Financial resource strain: Not on file  . Food insecurity:    Worry: Not on file    Inability: Not on file  . Transportation needs:    Medical: Not on file    Non-medical: Not on file  Tobacco Use  . Smoking status: Former Smoker    Packs/day: 2.00    Years:  2.00    Pack years: 4.00    Types: Cigarettes    Last attempt to quit: 12/14/1963    Years since quitting: 54.9  . Smokeless tobacco: Never Used  Substance and Sexual Activity  . Alcohol use: Yes    Alcohol/week: 2.0 standard drinks    Types: 2 Glasses of wine per week  . Drug use: Never  . Sexual activity: Yes  Lifestyle  . Physical activity:    Days per week: Not on file    Minutes per session: Not on file  . Stress: Not on file  Relationships  . Social connections:    Talks on phone: Not on file    Gets together: Not on file    Attends religious service: Not on file    Active member of club or organization: Not on file    Attends meetings of clubs or organizations: Not on file    Relationship status: Not on file  Other Topics Concern  . Not on file  Social History Narrative  . Not on file    Family History  Problem Relation Age of Onset  . Heart attack Father        16s  . Colon polyps Father   . Heart failure Mother        90s  . Subarachnoid hemorrhage Mother 83  . Hypertension Mother   . Subarachnoid hemorrhage Paternal Grandfather 5  . Diabetes Maternal Aunt   . Colon cancer Neg Hx     Review of Systems  Constitutional: Negative for fever.  HENT: Positive for rhinorrhea. Negative for congestion, ear pain, sinus pain and sore throat.   Respiratory: Positive for cough (productive - clear), chest tightness, shortness of breath and wheezing.        Objective:   Vitals:   10/27/18 0941  BP: 120/68  Pulse: 67  Resp: 18  Temp: 98.1 F (36.7 C)  SpO2: 94%   Filed Weights   10/27/18 0941  Weight: 196 lb (88.9 kg)   Body mass  index is 28.94 kg/m.  Wt Readings from Last 3 Encounters:  10/27/18 196 lb (88.9 kg)  10/12/18 194 lb (88 kg)  08/31/18 191 lb (86.6 kg)     Physical Exam GENERAL APPEARANCE: Appears stated age, well appearing, NAD EYES: conjunctiva clear, no icterus LUNGS: unlabored breathing, good air entry bilaterally, diffuse  wheezing throughout lung fields, no obvious crackles, but wheezing is significant and difficult to hear CARDIOVASCULAR: Normal S1,S2 without murmurs, no edema SKIN: warm, dry        Assessment & Plan:   See Problem List for Assessment and Plan of chronic medical problems.

## 2018-10-26 NOTE — Telephone Encounter (Signed)
Last refill was 09/11/18 Last OV was 09/01/18 Next OV is 10/27/18

## 2018-10-27 ENCOUNTER — Ambulatory Visit: Payer: Medicare Other | Admitting: Internal Medicine

## 2018-10-27 ENCOUNTER — Encounter: Payer: Self-pay | Admitting: Internal Medicine

## 2018-10-27 VITALS — BP 120/68 | HR 67 | Temp 98.1°F | Resp 18 | Ht 69.0 in | Wt 196.0 lb

## 2018-10-27 DIAGNOSIS — E1159 Type 2 diabetes mellitus with other circulatory complications: Secondary | ICD-10-CM | POA: Diagnosis not present

## 2018-10-27 DIAGNOSIS — J4541 Moderate persistent asthma with (acute) exacerbation: Secondary | ICD-10-CM | POA: Diagnosis not present

## 2018-10-27 MED ORDER — GLIPIZIDE 5 MG PO TABS: 5.0000 mg | ORAL_TABLET | Freq: Every day | ORAL | 0 refills | Status: DC

## 2018-10-27 MED ORDER — PREDNISONE 20 MG PO TABS: ORAL_TABLET | ORAL | 0 refills | Status: DC

## 2018-10-27 NOTE — Assessment & Plan Note (Signed)
His sugars did increase with his previous prednisone dose He is currently not on medication for his diabetes as they have been well controlled with diet I will give him him a written prescription for glipizide-May need to start if his sugars are high while taking the prednisone-can adjust as needed He will call with questions or concerns

## 2018-10-27 NOTE — Patient Instructions (Signed)
Start the prednisone taper as described.  We will adjust as needed   Continue the inhalers.     Use the glipizide if needed for elevated sugar.

## 2018-10-27 NOTE — Assessment & Plan Note (Signed)
His symptoms are consistent with an asthma exacerbation We both agree he unlikely has an infection at this point- his symptoms are related to his asthma alone, therefore we will hold off on any imaging and no additional antibiotics are needed His previous prednisone taper was not strong enough Prednisone 60 mg for 1 week and then taper.  Most likely we will be able to adjust his taper, but we will plan for a very long, extended taper.  We will adjust as needed and hopefully get him off sooner Continue Xopenex nebulizer treatments or albuterol every 6 hours and Symbicort twice daily Over-the-counter cold meds as needed He will call if there is no improvement

## 2018-10-30 ENCOUNTER — Other Ambulatory Visit: Payer: Self-pay | Admitting: Internal Medicine

## 2018-10-31 ENCOUNTER — Ambulatory Visit: Payer: Medicare Other | Admitting: Internal Medicine

## 2018-10-31 ENCOUNTER — Encounter: Payer: Self-pay | Admitting: Internal Medicine

## 2018-10-31 VITALS — BP 124/74 | HR 67 | Ht 69.0 in | Wt 190.6 lb

## 2018-10-31 DIAGNOSIS — Z950 Presence of cardiac pacemaker: Secondary | ICD-10-CM

## 2018-10-31 DIAGNOSIS — I10 Essential (primary) hypertension: Secondary | ICD-10-CM | POA: Diagnosis not present

## 2018-10-31 DIAGNOSIS — I441 Atrioventricular block, second degree: Secondary | ICD-10-CM | POA: Diagnosis not present

## 2018-10-31 DIAGNOSIS — I447 Left bundle-branch block, unspecified: Secondary | ICD-10-CM | POA: Diagnosis not present

## 2018-10-31 DIAGNOSIS — I2510 Atherosclerotic heart disease of native coronary artery without angina pectoris: Secondary | ICD-10-CM

## 2018-10-31 NOTE — Patient Instructions (Addendum)
Medication Instructions:  Your physician recommends that you continue on your current medications as directed. Please refer to the Current Medication list given to you today.  Labwork: None ordered.  Testing/Procedures: None ordered.  Follow-Up: Your physician wants you to follow-up in: 9 months with Dr. Lovena Le.   You will receive a reminder letter in the mail two months in advance. If you don't receive a letter, please call our office to schedule the follow-up appointment.  Remote monitoring is used to monitor your Pacemaker from home. This monitoring reduces the number of office visits required to check your device to one time per year. It allows [place] to keep an eye on the functioning of your device to ensure it is working properly. You are scheduled for a device check from home on 01/30/2019. You may send your transmission at any time that day. If you have a wireless device, the transmission will be sent automatically. After your physician reviews your transmission, you will receive a postcard with your next transmission date.  Any Other Special Instructions Will Be Listed Below (If Applicable).  If you need a refill on your cardiac medications before your next appointment, please call your pharmacy.

## 2018-10-31 NOTE — Progress Notes (Signed)
HPI Jeffery [name] returns today for followup of his PPM. He is a pleasant 77 yo man with LV dysfunction, heart block who underwent insertion of a biv PPM about 3 months ago. He is much improved with no dizzy or lightheaded spells. No edema. No chest pain.  Allergies  Allergen Reactions  . Contrast Media [Iodinated Diagnostic Agents] Other (See Comments)    Redness and warm sensation      Current Outpatient Medications  Medication Sig Dispense Refill  . ACCU-CHEK SOFTCLIX LANCETS lancets Use to test blood sugar daily as directed. 100 each 3  . albuterol (PROAIR HFA) 108 (90 BASE) MCG/ACT inhaler Inhale 2 puffs into the lungs every 6 (six) hours as needed for wheezing or shortness of breath.     [name] albuterol (PROVENTIL) (2.5 MG/3ML) 0.083% nebulizer solution Take 3 mLs (2.5 mg total) by nebulization every 6 (six) hours as needed for wheezing or shortness of breath. 150 mL 5  . aspirin 81 MG tablet Take 81 mg by mouth at bedtime.      . Blood Glucose Calibration (ACCU-CHEK AVIVA) SOLN Use as instructed to test blood sugar ICD 10 E11 51 1 each 3  . Blood Glucose Monitoring Suppl (ACCU-CHEK AVIVA PLUS) W/DEVICE KIT Use as directed 1 kit 0  . BYSTOLIC 5 MG tablet TAKE 1 TABLET BY MOUTH  DAILY 90 tablet 2  . esomeprazole (NEXIUM 24HR) 20 MG capsule Take 20 mg by mouth daily.    . fexofenadine (ALLEGRA) 180 MG tablet Take 180 mg by mouth daily as needed for allergies.     [name] glipiZIDE (GLUCOTROL) 5 MG tablet Take 1 tablet (5 mg total) by mouth daily before breakfast. 30 tablet 0  . glucose blood (ACCU-CHEK AVIVA PLUS) test strip Use as instructed to test blood sugar daily ICD 10 E11 51 100 each 3  . HYDROcodone-acetaminophen (NORCO) 7.5-325 MG tablet Take 1 tablet by mouth every 6 (six) hours as needed (for chronic joint pain). 30 tablet 0  . ibuprofen (ADVIL,MOTRIN) 800 MG tablet Take 800 mg by mouth every 8 (eight) hours as needed for headache or moderate pain.     El[employer] Gu[employer]  (AUTOLET IMPRESSION) MISC Use as instructed to test blood sugar ICD 10 E11 51 1 each 3  . levalbuterol (XOPENEX) 1.25 MG/3ML nebulizer solution Take 1.25 mg by nebulization every 4 (four) hours as needed for wheezing or shortness of breath. 72 mL 2  . lisinopril-hydrochlorothiazide (PRINZIDE,ZESTORETIC) 20-12.5 MG tablet TAKE 1 TABLET BY MOUTH  DAILY 90 tablet 0  . LORazepam (ATIVAN) 1 MG tablet TAKE 1/2 TO 1 TABLET BY MOUTH DAILY AS NEEDED 30 tablet 0  . metFORMIN (GLUCOPHAGE) 500 MG tablet Take 1 tablet (500 mg total) by mouth daily with breakfast. Needs appointment for more refills. 30 tablet 0  . montelukast (SINGULAIR) 10 MG tablet TAKE 1 TABLET BY MOUTH  DAILY 90 tablet 2  . nitroGLYCERIN (NITROSTAT) 0.4 MG SL tablet Place 0.4 mg under the tongue every 5 (five) minutes x 3 doses as needed for chest pain.    . predniSONE (DELTASONE) 20 MG tablet 3 tabs daily x 7 days, 2 tabs daily x 7 days, 1 tab daily x 7 days, 1/2 tab x 7 days 46 tablet 0  . rosuvastatin (CRESTOR) 5 MG tablet TAKE 1 TABLET BY MOUTH  EVERY MONDAY, WEDNESDAY,  AND FRIDAY 45 tablet 0  . SYMBICORT 160-4.5 MCG/ACT inhaler INHALE 1 TO 2 PUFFS INTO  THE LUNGS EVERY  12 HOURS AS NEEDED 30.6 g 1  . vardenafil (LEVITRA) 20 MG tablet TAKE 1 TABLET BY MOUTH AS DIRECTED. CAN NOT BE TAKEN WITH NITROGLYCERIN. (Patient taking differently: Take 20 mg by mouth daily as needed for erectile dysfunction (Cannot be taken with Nitroglycerin). ) 6 tablet 2   No current facility-administered medications for this visit.      Past Medical History:  Diagnosis Date  . Arthritis    "thumbs" (07/25/2018)  . Asthma   . CAD (coronary artery disease)   . CHF (congestive heart failure) ([place])   . Chronic bronchitis ([place])   . Environmental allergies   . GERD (gastroesophageal reflux disease)   . Headache    "seasonal; w/environmental allergies" (07/25/2018)  . History of blood transfusion    "when I had laminectomy" (07/25/2018)  . HTN (hypertension)     . Hyperlipidemia   . LBBB (left bundle branch block) 1999  . Myocardial infarction [facility])    "was told I've had an old MI; probably in the 46s" (07/25/2018)  . Pneumonia    "as a child, age 75; viral pneumonia 3 times in the last 10 years" (07/25/2018)  . Presence of permanent cardiac pacemaker 07/25/2018  . Seasonal allergies   . Sleep apnea    "wife says I do" (07/25/2018)  . Type II diabetes mellitus (HCC)     ROS:   All systems reviewed and negative except as noted in the HPI.   Past Surgical History:  Procedure Laterality Date  . BACK SURGERY    . BIV PACEMAKER INSERTION CRT-P  07/25/2018  . BIV PACEMAKER INSERTION CRT-P N/A 07/25/2018   Procedure: BIV PACEMAKER INSERTION CRT-P;  Surgeon: Evans Lance, MD;  Location: [facility];  Service: Cardiovascular;  Laterality: N/A;  . CARDIAC CATHETERIZATION  2003   Dr Pernell Dupre; 85 % R circumflex obstruction  . CARPAL TUNNEL RELEASE Left 10/11/2014   Procedure: LEFT CARPAL TUNNEL RELEASE;  Surgeon: Roseanne Kaufman, MD;  Location: [place];  Service: Orthopedics;  Laterality: Left;  . COLONOSCOPY W/ BIOPSIES AND POLYPECTOMY  2018  . INGUINAL HERNIA REPAIR Right 1948  . INGUINAL HERNIA REPAIR Left 1988  . LUMBAR LAMINECTOMY  1984  . MINOR CARPAL TUNNEL Right 11/22/2014   Procedure: RIGHT LIMITED OPEN CARPAL TUNNEL RELEASE;  Surgeon: Roseanne Kaufman, MD;  Location: [facility];  Service: Orthopedics;  Laterality: Right;  . TONSILLECTOMY  1958     Family History  Problem Relation Age of Onset  . Heart attack Father        15s  . Colon polyps Father   . Heart failure Mother        90s  . Subarachnoid hemorrhage Mother 31  . Hypertension Mother   . Subarachnoid hemorrhage Paternal Grandfather 1  . Diabetes Maternal Aunt   . Colon cancer Neg Hx      Social History   Socioeconomic History  . Marital status: Married    Spouse name: Not on file  . Number of children: Not on file  .  Years of education: Not on file  . Highest education level: Not on file  Occupational History  . Not on file  Social Needs  . Financial resource strain: Not on file  . Food insecurity:    Worry: Not on file    Inability: Not on file  . Transportation needs:    Medical: Not on file    Non-medical: Not on file  Tobacco Use  .  Smoking status: Former Smoker    Packs/day: 2.00    Years: 2.00    Pack years: 4.00    Types: Cigarettes    Last attempt to quit: 12/14/1963    Years since quitting: 54.9  . Smokeless tobacco: Never Used  Substance and Sexual Activity  . Alcohol use: Yes    Alcohol/week: 2.0 standard drinks    Types: 2 Glasses of wine per week  . Drug use: Never  . Sexual activity: Yes  Lifestyle  . Physical activity:    Days per week: Not on file    Minutes per session: Not on file  . Stress: Not on file  Relationships  . Social connections:    Talks on phone: Not on file    Gets together: Not on file    Attends religious service: Not on file    Active member of club or organization: Not on file    Attends meetings of clubs or organizations: Not on file    Relationship status: Not on file  . Intimate partner violence:    Fear of current or ex partner: Not on file    Emotionally abused: Not on file    Physically abused: Not on file    Forced sexual activity: Not on file  Other Topics Concern  . Not on file  Social History Narrative  . Not on file     BP 124/74   Pulse 67   Ht 5' 9"  (1.753 m)   Wt 190 lb 9.6 oz (86.5 kg)   SpO2 99%   BMI 28.15 kg/m   Physical Exam:  Well appearing 77 yo man, NAD HEENT: Unremarkable Neck:  No JVD, no thyromegally Lymphatics:  No adenopathy Back:  No CVA tenderness Lungs:  Clear with no wheezes HEART:  Regular rate rhythm, no murmurs, no rubs, no clicks Abd:  soft, positive bowel sounds, no organomegally, no rebound, no guarding Ext:  2 plus pulses, no edema, no cyanosis, no clubbing Skin:  No rashes no  nodules Neuro:  CN II through XII intact, motor grossly intact  EKG - nsr with biv pacing  DEVICE  Normal device function.  See PaceArt for details.   Assess/Plan: 1. Heart block - he is asymptomatic, s/p PPM insertion 2. PPM - his [employer] DDD/biv ppm is working normally. We will recheck in several months. 3. LV dysfunction - his symptoms are class 1. He will continue his current meds.  Mikle Bosworth.D.

## 2018-11-02 ENCOUNTER — Telehealth: Payer: Self-pay | Admitting: *Deleted

## 2018-11-02 NOTE — Telephone Encounter (Signed)
"  Good afternoon Jeffery [name], I'm a patient of the [facility].  I'm supposed to have a scheduled procedure on the third of December.  I need to change that to the second week in January.  My doctor is Dr. Paulla Dolly.  Call me back when you have a date in the second week of January.  Thank you very much."

## 2018-11-03 NOTE — Telephone Encounter (Signed)
I am returning your call.  You want to reschedule your surgery?  "Yes, doing it in December is a bit much to prepare for.  I'd like to do it the first or second week of January."  Dr. Paulla Dolly can do it on January 7 or 14.  "Let's schedule it for January 14.  What time?"  I can't give you a specific time.  Someone from the surgical center normally calls the patients the Friday or Monday prior to the surgery date.  The will give you your arrival time.  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  Dr. Paulla Dolly said it's only going to take 15 minutes."  It could possibly take up to 3 hours total."  It's not going to take me long.  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

## 2018-11-03 NOTE — Telephone Encounter (Signed)
Postop appointments have been rescheduled. °

## 2018-11-04 ENCOUNTER — Encounter: Payer: Self-pay | Admitting: Internal Medicine

## 2018-11-06 MED ORDER — CEFDINIR 300 MG PO CAPS: 300.0000 mg | ORAL_CAPSULE | Freq: Two times a day (BID) | ORAL | 0 refills | Status: DC

## 2018-11-06 NOTE — Telephone Encounter (Signed)
Blood sugars are everywhere. 175 this morning fasting has been as high as 250.  Not running fevers Coughing really bad in the mornings clear productive Rhinitis clear  Concerned more about his blood sugars. He states if you feel like they aren't as concerning because of the steroids then he will understand that. Wants to know if there is something he needs to do in the mean time to help sugars if needed. Does not know if you want to try him on an antibiotic or want to see him again. He said to just let him know what you think would be necessary.   States you can mychart him or call him.

## 2018-11-06 NOTE — Telephone Encounter (Signed)
Sounds fine 

## 2018-11-07 ENCOUNTER — Other Ambulatory Visit: Payer: Self-pay

## 2018-11-07 ENCOUNTER — Other Ambulatory Visit: Payer: Self-pay | Admitting: Internal Medicine

## 2018-11-07 MED ORDER — GLUCOSE BLOOD VI STRP: ORAL_STRIP | 3 refills | Status: DC

## 2018-11-07 MED ORDER — LANCETS MISC: 3 refills | Status: DC

## 2018-11-07 MED ORDER — LORAZEPAM 1 MG PO TABS: ORAL_TABLET | ORAL | 0 refills | Status: DC

## 2018-11-07 NOTE — Telephone Encounter (Signed)
Last refill was 10/10/18 Next OV is Not on file

## 2018-11-09 ENCOUNTER — Other Ambulatory Visit: Payer: Self-pay | Admitting: Internal Medicine

## 2018-11-10 ENCOUNTER — Encounter: Payer: Self-pay | Admitting: Internal Medicine

## 2018-11-10 MED ORDER — GLIPIZIDE 5 MG PO TABS: 5.0000 mg | ORAL_TABLET | Freq: Every day | ORAL | 1 refills | Status: DC

## 2018-11-14 ENCOUNTER — Other Ambulatory Visit: Payer: Self-pay | Admitting: Internal Medicine

## 2018-11-15 ENCOUNTER — Encounter: Payer: Self-pay | Admitting: Internal Medicine

## 2018-11-15 MED ORDER — HYDROCODONE-ACETAMINOPHEN 7.5-325 MG PO TABS: 1.0000 | ORAL_TABLET | Freq: Four times a day (QID) | ORAL | 0 refills | Status: DC | PRN

## 2018-11-15 NOTE — Telephone Encounter (Signed)
MD approved and sent electronically to pof../lmb  

## 2018-11-15 NOTE — Telephone Encounter (Signed)
Check [place] registry last filled 10/26/2018.Marland KitchenJohny Chess

## 2018-11-17 ENCOUNTER — Ambulatory Visit: Payer: Medicare Other | Admitting: Internal Medicine

## 2018-11-17 ENCOUNTER — Encounter: Payer: Self-pay | Admitting: Internal Medicine

## 2018-11-17 ENCOUNTER — Ambulatory Visit (INDEPENDENT_AMBULATORY_CARE_PROVIDER_SITE_OTHER)
Admission: RE | Admit: 2018-11-17 | Discharge: 2018-11-17 | Disposition: A | Discharge status: Home / Self Care | Payer: Medicare Other | Source: Ambulatory Visit | Attending: Internal Medicine | Admitting: Internal Medicine

## 2018-11-17 ENCOUNTER — Telehealth: Payer: Self-pay

## 2018-11-17 VITALS — BP 92/52 | HR 84 | Temp 98.6°F | Resp 16 | Ht 69.0 in | Wt 189.0 lb

## 2018-11-17 DIAGNOSIS — R05 Cough: Secondary | ICD-10-CM

## 2018-11-17 DIAGNOSIS — J4541 Moderate persistent asthma with (acute) exacerbation: Secondary | ICD-10-CM | POA: Diagnosis not present

## 2018-11-17 DIAGNOSIS — R0602 Shortness of breath: Secondary | ICD-10-CM | POA: Diagnosis not present

## 2018-11-17 DIAGNOSIS — R509 Fever, unspecified: Secondary | ICD-10-CM | POA: Diagnosis not present

## 2018-11-17 DIAGNOSIS — R059 Cough, unspecified: Secondary | ICD-10-CM

## 2018-11-17 DIAGNOSIS — J189 Pneumonia, unspecified organism: Secondary | ICD-10-CM

## 2018-11-17 MED ORDER — METHYLPREDNISOLONE ACETATE 80 MG/ML IJ SUSP
80.0000 mg | Freq: Once | INTRAMUSCULAR | Status: AC
  Administered 2018-11-17: 80 mg via INTRAMUSCULAR

## 2018-11-17 MED ORDER — LEVOFLOXACIN 500 MG PO TABS: 500.0000 mg | ORAL_TABLET | Freq: Every day | ORAL | 0 refills | Status: DC

## 2018-11-17 MED ORDER — PREDNISONE 10 MG PO TABS: ORAL_TABLET | ORAL | 0 refills | Status: DC

## 2018-11-17 NOTE — Progress Notes (Signed)
Subjective:    Patient ID: Jeffery [name], male    DOB: 10-29-1941, 77 y.o.   MRN: [id number]  HPI The patient is here for an acute visit.  He continues to feel bad.   There has not been much improvement.  The past couple of days he has had a fever up to 102.  His cough is very productive and gets worse as the day goes on.  He has wheezing, SOB and chest tightness.  He has had headaches, lightheadedness, severe fatigue, decreased appetite, rhinorrhea and nausea.    He is doing the xopenex every 8 hours, mucinex and nyquil.    His sugars have been high with the steroids - he has adjusted his glipizide to help keep the sugars controlled.     Medications and allergies reviewed with patient and updated if appropriate.  Patient Active Problem List   Diagnosis Date Noted  . Moderate persistent asthma with exacerbation 10/27/2018  . Folliculitis 77/82/4235  . Chronic systolic heart failure ([place]) 07/25/2018  . Anxiety 03/26/2018  . Metatarsal bone fracture 03/07/2018  . Encounter for chronic pain management 04/13/2017  . Osteoarthritis 04/12/2017  . Chronic combined systolic and diastolic CHF (congestive heart failure) ([place]) 01/24/2015  . OSA (obstructive sleep apnea) 10/03/2014  . Left bundle branch block 11/27/2013  . Essential hypertension 11/27/2013  . GERD (gastroesophageal reflux disease) 02/27/2013  . Type 2 diabetes mellitus with vascular disease ([place]) 04/09/2009  . Coronary atherosclerosis 09/06/2008  . Asthma 09/06/2008    Current Outpatient Medications on File Prior to Visit  Medication Sig Dispense Refill  . ACCU-CHEK SOFTCLIX LANCETS lancets Use to test blood sugar daily as directed. 100 each 3  . albuterol (PROAIR HFA) 108 (90 BASE) MCG/ACT inhaler Inhale 2 puffs into the lungs every 6 (six) hours as needed for wheezing or shortness of breath.     [name] albuterol (PROVENTIL) (2.5 MG/3ML) 0.083% nebulizer solution Take 3 mLs (2.5 mg total) by nebulization every 6 (six)  hours as needed for wheezing or shortness of breath. 150 mL 5  . aspirin 81 MG tablet Take 81 mg by mouth at bedtime.      . Blood Glucose Calibration (ACCU-CHEK AVIVA) SOLN Use as instructed to test blood sugar ICD 10 E11 51 1 each 3  . Blood Glucose Monitoring Suppl (ACCU-CHEK AVIVA PLUS) W/DEVICE KIT Use as directed 1 kit 0  . BYSTOLIC 5 MG tablet TAKE 1 TABLET BY MOUTH  DAILY 90 tablet 2  . esomeprazole (NEXIUM 24HR) 20 MG capsule Take 20 mg by mouth daily.    . fexofenadine (ALLEGRA) 180 MG tablet Take 180 mg by mouth daily as needed for allergies.     [name] glipiZIDE (GLUCOTROL) 5 MG tablet Take 1 tablet (5 mg total) by mouth daily before breakfast. 30 tablet 1  . glucose blood (ACCU-CHEK AVIVA PLUS) test strip Use as instructed to test blood sugar 6 times daily. DX Code: E11.59 600 each 3  . ibuprofen (ADVIL,MOTRIN) 800 MG tablet Take 800 mg by mouth every 8 (eight) hours as needed for headache or moderate pain.     Elmore Guise Devices (AUTOLET IMPRESSION) MISC Use as instructed to test blood sugar ICD 10 E11 51 1 each 3  . Lancets MISC Use to check blood sugars 6 times daily. DX Code: E11.59 600 each 3  . levalbuterol (XOPENEX) 1.25 MG/3ML nebulizer solution Take 1.25 mg by nebulization every 4 (four) hours as needed for wheezing or shortness of breath. [place]  mL 2  . lisinopril-hydrochlorothiazide (PRINZIDE,ZESTORETIC) 20-12.5 MG tablet TAKE 1 TABLET BY MOUTH  DAILY 90 tablet 0  . LORazepam (ATIVAN) 1 MG tablet TAKE 1/2 TO 1 TABLET BY MOUTH DAILY AS NEEDED 30 tablet 0  . metFORMIN (GLUCOPHAGE) 500 MG tablet Take 1 tablet (500 mg total) by mouth daily with breakfast. Needs appointment for more refills. 30 tablet 0  . montelukast (SINGULAIR) 10 MG tablet TAKE 1 TABLET BY MOUTH  DAILY 90 tablet 2  . nitroGLYCERIN (NITROSTAT) 0.4 MG SL tablet Place 0.4 mg under the tongue every 5 (five) minutes x 3 doses as needed for chest pain.    . rosuvastatin (CRESTOR) 5 MG tablet TAKE 1 TABLET BY MOUTH  EVERY  MONDAY, WEDNESDAY,  AND FRIDAY 45 tablet 0  . SYMBICORT 160-4.5 MCG/ACT inhaler INHALE 1 TO 2 PUFFS INTO  THE LUNGS EVERY 12 HOURS AS NEEDED 30.6 g 1  . vardenafil (LEVITRA) 20 MG tablet TAKE 1 TABLET BY MOUTH AS DIRECTED. CAN NOT BE TAKEN WITH NITROGLYCERIN. (Patient taking differently: Take 20 mg by mouth daily as needed for erectile dysfunction (Cannot be taken with Nitroglycerin). ) 6 tablet 2   No current facility-administered medications on file prior to visit.     Past Medical History:  Diagnosis Date  . Arthritis    "thumbs" (07/25/2018)  . Asthma   . CAD (coronary artery disease)   . CHF (congestive heart failure) ([place])   . Chronic bronchitis ([place])   . Environmental allergies   . GERD (gastroesophageal reflux disease)   . Headache    "seasonal; w/environmental allergies" (07/25/2018)  . History of blood transfusion    "when I had laminectomy" (07/25/2018)  . HTN (hypertension)   . Hyperlipidemia   . LBBB (left bundle branch block) 1999  . Myocardial infarction [facility])    "was told I've had an old MI; probably in the 12s" (07/25/2018)  . Pneumonia    "as a child, age 26; viral pneumonia 3 times in the last 10 years" (07/25/2018)  . Presence of permanent cardiac pacemaker 07/25/2018  . Seasonal allergies   . Sleep apnea    "wife says I do" (07/25/2018)  . Type II diabetes mellitus ([place])     Past Surgical History:  Procedure Laterality Date  . BACK SURGERY    . BIV PACEMAKER INSERTION CRT-P  07/25/2018  . BIV PACEMAKER INSERTION CRT-P N/A 07/25/2018   Procedure: BIV PACEMAKER INSERTION CRT-P;  Surgeon: Evans Lance, MD;  Location: [place] [facility];  Service: Cardiovascular;  Laterality: N/A;  . CARDIAC CATHETERIZATION  2003   Dr Pernell Dupre; 85 % R circumflex obstruction  . CARPAL TUNNEL RELEASE Left 10/11/2014   Procedure: LEFT CARPAL TUNNEL RELEASE;  Surgeon: Roseanne Kaufman, MD;  Location: [place];  Service: Orthopedics;  Laterality: Left;  .  COLONOSCOPY W/ BIOPSIES AND POLYPECTOMY  2018  . INGUINAL HERNIA REPAIR Right 1948  . INGUINAL HERNIA REPAIR Left 1988  . LUMBAR LAMINECTOMY  1984  . MINOR CARPAL TUNNEL Right 11/22/2014   Procedure: RIGHT LIMITED OPEN CARPAL TUNNEL RELEASE;  Surgeon: Roseanne Kaufman, MD;  Location: [facility];  Service: Orthopedics;  Laterality: Right;  . TONSILLECTOMY  1958    Social History   Socioeconomic History  . Marital status: Married    Spouse name: Not on file  . Number of children: Not on file  . Years of education: Not on file  . Highest education level: Not on file  Occupational  History  . Not on file  Social Needs  . Financial resource strain: Not on file  . Food insecurity:    Worry: Not on file    Inability: Not on file  . Transportation needs:    Medical: Not on file    Non-medical: Not on file  Tobacco Use  . Smoking status: Former Smoker    Packs/day: 2.00    Years: 2.00    Pack years: 4.00    Types: Cigarettes    Last attempt to quit: 12/14/1963    Years since quitting: 54.9  . Smokeless tobacco: Never Used  Substance and Sexual Activity  . Alcohol use: Yes    Alcohol/week: 2.0 standard drinks    Types: 2 Glasses of wine per week  . Drug use: Never  . Sexual activity: Yes  Lifestyle  . Physical activity:    Days per week: Not on file    Minutes per session: Not on file  . Stress: Not on file  Relationships  . Social connections:    Talks on phone: Not on file    Gets together: Not on file    Attends religious service: Not on file    Active member of club or organization: Not on file    Attends meetings of clubs or organizations: Not on file    Relationship status: Not on file  Other Topics Concern  . Not on file  Social History Narrative  . Not on file    Family History  Problem Relation Age of Onset  . Heart attack Father        1s  . Colon polyps Father   . Heart failure Mother        90s  . Subarachnoid hemorrhage Mother 84  .  Hypertension Mother   . Subarachnoid hemorrhage Paternal Grandfather 36  . Diabetes Maternal Aunt   . Colon cancer Neg Hx     Review of Systems  Constitutional: Positive for appetite change, chills, fatigue and fever.  HENT: Positive for rhinorrhea. Negative for congestion, ear pain and sore throat.   Respiratory: Positive for cough, shortness of breath and wheezing.   Gastrointestinal: Positive for nausea.  Neurological: Positive for light-headedness and headaches.       Objective:   Vitals:   11/17/18 1039  BP: (!) 92/52  Pulse: 84  Resp: 16  Temp: 98.6 F (37 C)  SpO2: 95%   BP Readings from Last 3 Encounters:  11/17/18 (!) 92/52  10/31/18 124/74  10/27/18 120/68   Wt Readings from Last 3 Encounters:  11/17/18 189 lb (85.7 kg)  10/31/18 190 lb 9.6 oz (86.5 kg)  10/27/18 196 lb (88.9 kg)   Body mass index is 27.91 kg/m.   Physical Exam    GENERAL APPEARANCE: Appears stated age, well appearing, NAD EYES: conjunctiva clear, no icterus HEENT: bilateral tympanic membranes and ear canals normal, oropharynx with no erythema, no thyromegaly, trachea midline, no cervical or supraclavicular lymphadenopathy LUNGS: Unlabored breathing, good air entry bilaterally, significant diffuse wheezes throughout lungs, no obvious crackles CARDIOVASCULAR: Normal S1,S2 without murmurs, no edema SKIN: Warm, dry      Assessment & Plan:    See Problem List for Assessment and Plan of chronic medical problems.

## 2018-11-17 NOTE — Telephone Encounter (Signed)
Copied from [place] [id number]. Topic: Appointment Scheduling - Scheduling Inquiry for Clinic >> Nov 17, 2018  8:05 AM Scherrie Gerlach wrote: Reason for CRM: pt states he now has a fever, last night 102. This is a new sx.  Pt states his cough is severe, highly productive.  He states his cough is highly productive. Pt states Dr Quay Burow is aware of his issue, and advised him to call if anything changed,  pt requesting urgent appt with her today, this am to rule out pna and get a chest x ray.  Unable to schedule, and pt prefers to see Dr Quay Burow if at all possible

## 2018-11-17 NOTE — Telephone Encounter (Signed)
Please advise. Would like to see you only.

## 2018-11-17 NOTE — Telephone Encounter (Signed)
Ok to schedule - 10:30

## 2018-11-17 NOTE — Telephone Encounter (Signed)
Appointment made

## 2018-11-17 NOTE — Patient Instructions (Signed)
Have a chest xray today.   You had a Depo-medrol injection - 80 mg    Take the prednisone taper and Levaquin.   Continue the xopenx, mucinex.

## 2018-11-18 DIAGNOSIS — J189 Pneumonia, unspecified organism: Secondary | ICD-10-CM | POA: Insufficient documentation

## 2018-11-18 NOTE — Assessment & Plan Note (Signed)
Asthma exacerabation - mod-severe in nature Depo-medrol 80 mg IM x 1 Prednisone 60 mg taper xopenex nebs Q 8 hrs otc cold meds Rest, fluids Call if no improvement

## 2018-11-18 NOTE — Assessment & Plan Note (Signed)
Probable CAP CXR today  Start Levaquin 500 mg qd x 7 days Depo-medrol 80 mg IM x 1 Prednisone taper starting at 60 mg  - can adjust depending on symptoms xopenx q 8 hrs mucinex otc cold meds Rest, fluids Call if no improvement

## 2018-11-19 ENCOUNTER — Other Ambulatory Visit: Payer: Self-pay | Admitting: Internal Medicine

## 2018-11-20 ENCOUNTER — Encounter: Payer: Self-pay | Admitting: Internal Medicine

## 2018-11-20 ENCOUNTER — Other Ambulatory Visit: Payer: Medicare Other

## 2018-11-20 NOTE — Telephone Encounter (Signed)
[employer].  He held his lisinopril - hctz  bp is better.  He will monitor  He is better - still having a productive cough, mostly in the am.  No fever, periodic wheeze  Sugars controlled  xopenex Q 6-8 hrs

## 2018-11-27 ENCOUNTER — Encounter: Payer: Self-pay | Admitting: Internal Medicine

## 2018-11-27 ENCOUNTER — Telehealth: Payer: Self-pay

## 2018-11-27 DIAGNOSIS — J189 Pneumonia, unspecified organism: Secondary | ICD-10-CM

## 2018-11-27 DIAGNOSIS — R062 Wheezing: Secondary | ICD-10-CM

## 2018-11-27 NOTE — Telephone Encounter (Signed)
Called and left a message.  I will try calling him again in 10 minutes.

## 2018-11-27 NOTE — Telephone Encounter (Signed)
Pt would like you to call him at your convenience.

## 2018-11-27 NOTE — Telephone Encounter (Signed)
Called again.  He is still on the steroids.  He has completed the antibiotic.  He does feel that he has been run over by steroids.  He is currently taking 40 mg daily.  His strength is 3/10.  His stamina is low.  He is still doing Xopenex nebulizer treatments.  He still has a fairly productive cough, but it is not all the time.  The other day he had a violent cough that caused some typical left arm angina-he took 325 mg of aspirin and the symptoms resolved.  They did recur and he took nitroglycerin and they resolved within 60-90 seconds.  He has had this before and he does have a follow-up with cardiology.  He does feel chronically dehydrated.  His sugars are 150-290s.  He is currently taking the glipizide and adjusting not to help control the sugars.  He is still having wheezing, chest tightness.  He will continue the above treatment.  I will refer to pulmonary for their input and advice

## 2018-11-28 ENCOUNTER — Ambulatory Visit: Payer: Medicare Other | Admitting: Internal Medicine

## 2018-11-29 ENCOUNTER — Other Ambulatory Visit: Payer: Self-pay

## 2018-11-29 NOTE — Patient Outreach (Signed)
[facility]) Care Management  11/29/2018  Tadan Shill [facility] [birth year] [id number]   Medication Adherence call to Mr. Nassir Neidert left a message for patient to call back patient is due on Rosuvastatin 5 mg. Mr. Claiborne is showing past due under [place].    [facility] Management Direct Dial [phone]  Fax [phone] [email or web address]

## 2018-11-30 ENCOUNTER — Ambulatory Visit: Payer: Medicare Other | Admitting: Pulmonary Disease

## 2018-11-30 ENCOUNTER — Encounter: Payer: Self-pay | Admitting: Pulmonary Disease

## 2018-11-30 VITALS — BP 128/62 | HR 75 | Ht 69.0 in | Wt 184.0 lb

## 2018-11-30 DIAGNOSIS — R0602 Shortness of breath: Secondary | ICD-10-CM | POA: Diagnosis not present

## 2018-11-30 DIAGNOSIS — J9811 Atelectasis: Secondary | ICD-10-CM

## 2018-11-30 DIAGNOSIS — J209 Acute bronchitis, unspecified: Secondary | ICD-10-CM | POA: Diagnosis not present

## 2018-11-30 DIAGNOSIS — J45909 Unspecified asthma, uncomplicated: Secondary | ICD-10-CM

## 2018-11-30 DIAGNOSIS — R062 Wheezing: Secondary | ICD-10-CM

## 2018-11-30 NOTE — Patient Instructions (Addendum)
Thank you for visiting Dr. Valeta Harms at [facility]. Today we recommend the following: Orders Placed This Encounter  Procedures  . DG Chest 2 View  . Pulmonary function test   Please schedule F/U with NP to discuss PFT results. Please call our office prior to next visit to see if our X room is up and running. If not please go to the [facility] location for your CXR prior to your next appointment.   [place] Go to the basement to x-ray department.   No follow-ups on file.

## 2018-11-30 NOTE — Progress Notes (Signed)
Synopsis: Referred in December 2019 for shortness of breath cough by [name], MD  Subjective:   PATIENT ID: Jeffery [name] GENDER: male DOB: 07/17/1941, MRN: [id number]  Chief Complaint  Patient presents with  . Consult    Consult for Wheezing and HX of PNE. States he caught a cold in October from his grandchild ad it progressed quickly. He states it developed into PNE and he has been SOB and wheezing every since. He states he does have a HX of Asthma. Curretly on prednisone taper that he still has about a week and a half of.     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   Approximately 6 weeks ago the patient developed an upper respiratory tract infection after having exposure to his 75-year-old granddaughter who was also sick.  It lingered for the first week but then went directly into his chest.  He had chest tightness sputum production fevers chills.  He was seen by his primary care doctor and placed on antibiotics.  Subsequently returned to primary care with additional courses of antibiotics and now completing a third course which was Levaquin.  As well as a prolonged steroid taper.  He has a 5-year history of smoking from the time of [age].  He smoked a pipe off and on through the years and quit many many years ago.  He felt like when he got sick he was in a little bit of heart failure as well.  He follows regularly with his cardiologist.  He has a pacemaker placed and history of biventricular failure.  Also recently having issues with hypoglycemia.  He started on a new diabetic medication he has been watching this carefully.  As for his respiratory symptoms he states today has been the best day he has had in 6 weeks.  He kept his appointment today just to make sure he could establish care with a new pulmonologist.  He used to see Dr. Gwenette Greet several years ago.  He is currently managed with Xopenex and Symbicort at baseline.  He  has had asthma for years since diagnosed as a child.  Usually his respiratory tract infections only last for a couple of weeks 10 to 14 days but this is the first time it has been this bad in a long time.   Past Medical History:  Diagnosis Date  . Arthritis    "thumbs" (07/25/2018)  . Asthma   . CAD (coronary artery disease)   . CHF (congestive heart failure) ([place])   . Chronic bronchitis ([place])   . Environmental allergies   . GERD (gastroesophageal reflux disease)   . Headache    "seasonal; w/environmental allergies" (07/25/2018)  . History of blood transfusion    "when I had laminectomy" (07/25/2018)  . HTN (hypertension)   . Hyperlipidemia   . LBBB (left bundle branch block) 1999  . Myocardial infarction [facility])    "was told I've had an old MI; probably in the 75s" (07/25/2018)  . Pneumonia    "as a child, age 77; viral pneumonia 3 times in the last 10 years" (07/25/2018)  . Presence of permanent cardiac pacemaker 07/25/2018  . Seasonal allergies   . Sleep apnea    "wife says I do" (07/25/2018)  . Type II diabetes mellitus (HCC)      Family History  Problem Relation Age of Onset  . Heart attack Father  40s  . Colon polyps Father   . Heart failure Mother        90s  . Subarachnoid hemorrhage Mother 48  . Hypertension Mother   . Subarachnoid hemorrhage Paternal Grandfather 42  . Diabetes Maternal Aunt   . Colon cancer Neg Hx      Past Surgical History:  Procedure Laterality Date  . BACK SURGERY    . BIV PACEMAKER INSERTION CRT-P  07/25/2018  . BIV PACEMAKER INSERTION CRT-P N/A 07/25/2018   Procedure: BIV PACEMAKER INSERTION CRT-P;  Surgeon: Evans Lance, MD;  Location: [facility];  Service: Cardiovascular;  Laterality: N/A;  . CARDIAC CATHETERIZATION  2003   Dr Pernell Dupre; 85 % R circumflex obstruction  . CARPAL TUNNEL RELEASE Left 10/11/2014   Procedure: LEFT CARPAL TUNNEL RELEASE;  Surgeon: Roseanne Kaufman, MD;  Location: [place];   Service: Orthopedics;  Laterality: Left;  . COLONOSCOPY W/ BIOPSIES AND POLYPECTOMY  2018  . INGUINAL HERNIA REPAIR Right 1948  . INGUINAL HERNIA REPAIR Left 1988  . LUMBAR LAMINECTOMY  1984  . MINOR CARPAL TUNNEL Right 11/22/2014   Procedure: RIGHT LIMITED OPEN CARPAL TUNNEL RELEASE;  Surgeon: Roseanne Kaufman, MD;  Location: [place];  Service: Orthopedics;  Laterality: Right;  . TONSILLECTOMY  1958    Social History   Socioeconomic History  . Marital status: Married    Spouse name: Not on file  . Number of children: Not on file  . Years of education: Not on file  . Highest education level: Not on file  Occupational History  . Not on file  Social Needs  . Financial resource strain: Not on file  . Food insecurity:    Worry: Not on file    Inability: Not on file  . Transportation needs:    Medical: Not on file    Non-medical: Not on file  Tobacco Use  . Smoking status: Former Smoker    Packs/day: 2.00    Years: 2.00    Pack years: 4.00    Types: Cigarettes    Last attempt to quit: 12/14/1963    Years since quitting: 55.0  . Smokeless tobacco: Never Used  Substance and Sexual Activity  . Alcohol use: Yes    Alcohol/week: 2.0 standard drinks    Types: 2 Glasses of wine per week  . Drug use: Never  . Sexual activity: Yes  Lifestyle  . Physical activity:    Days per week: Not on file    Minutes per session: Not on file  . Stress: Not on file  Relationships  . Social connections:    Talks on phone: Not on file    Gets together: Not on file    Attends religious service: Not on file    Active member of club or organization: Not on file    Attends meetings of clubs or organizations: Not on file    Relationship status: Not on file  . Intimate partner violence:    Fear of current or ex partner: Not on file    Emotionally abused: Not on file    Physically abused: Not on file    Forced sexual activity: Not on file  Other Topics Concern  . Not on file    Social History Narrative  . Not on file     Allergies  Allergen Reactions  . Contrast Media [Iodinated Diagnostic Agents] Other (See Comments)    Redness and warm sensation      Outpatient  Medications Prior to Visit  Medication Sig Dispense Refill  . ACCU-CHEK SOFTCLIX LANCETS lancets Use to test blood sugar daily as directed. 100 each 3  . albuterol (PROAIR HFA) 108 (90 BASE) MCG/ACT inhaler Inhale 2 puffs into the lungs every 6 (six) hours as needed for wheezing or shortness of breath.     [name] albuterol (PROVENTIL) (2.5 MG/3ML) 0.083% nebulizer solution Take 3 mLs (2.5 mg total) by nebulization every 6 (six) hours as needed for wheezing or shortness of breath. 150 mL 5  . aspirin 81 MG tablet Take 81 mg by mouth at bedtime.      . Blood Glucose Calibration (ACCU-CHEK AVIVA) SOLN Use as instructed to test blood sugar ICD 10 E11 51 1 each 3  . Blood Glucose Monitoring Suppl (ACCU-CHEK AVIVA PLUS) W/DEVICE KIT Use as directed 1 kit 0  . BYSTOLIC 5 MG tablet TAKE 1 TABLET BY MOUTH  DAILY 90 tablet 2  . esomeprazole (NEXIUM 24HR) 20 MG capsule Take 20 mg by mouth daily.    . fexofenadine (ALLEGRA) 180 MG tablet Take 180 mg by mouth daily as needed for allergies.     [name] glipiZIDE (GLUCOTROL) 5 MG tablet Take 1 tablet (5 mg total) by mouth daily before breakfast. 30 tablet 1  . glucose blood (ACCU-CHEK AVIVA PLUS) test strip Use as instructed to test blood sugar 6 times daily. DX Code: E11.59 600 each 3  . ibuprofen (ADVIL,MOTRIN) 800 MG tablet Take 800 mg by mouth every 8 (eight) hours as needed for headache or moderate pain.     Elmore Guise Devices (AUTOLET IMPRESSION) MISC Use as instructed to test blood sugar ICD 10 E11 51 1 each 3  . Lancets MISC Use to check blood sugars 6 times daily. DX Code: E11.59 600 each 3  . levalbuterol (XOPENEX) 1.25 MG/3ML nebulizer solution Take 1.25 mg by nebulization every 4 (four) hours as needed for wheezing or shortness of breath. 72 mL 2  .  lisinopril-hydrochlorothiazide (PRINZIDE,ZESTORETIC) 20-12.5 MG tablet TAKE 1 TABLET BY MOUTH  DAILY 90 tablet 0  . LORazepam (ATIVAN) 1 MG tablet TAKE 1/2 TO 1 TABLET BY MOUTH DAILY AS NEEDED 30 tablet 0  . metFORMIN (GLUCOPHAGE) 500 MG tablet TAKE 1 TABLET BY MOUTH  DAILY WITH BREAKFAST. 90 tablet 1  . nitroGLYCERIN (NITROSTAT) 0.4 MG SL tablet Place 0.4 mg under the tongue every 5 (five) minutes x 3 doses as needed for chest pain.    . predniSONE (DELTASONE) 10 MG tablet 6 tabs qd x 4 days, 5 tabs qd x 4 days, 4 tabs qd x 4 days,  3 tabs qd x 4 days, 2 tabs qd x 4 days, 1 tabs qd x 4 days, 1/2 tab qd x 4 days 86 tablet 0  . rosuvastatin (CRESTOR) 5 MG tablet TAKE 1 TABLET BY MOUTH  EVERY MONDAY, WEDNESDAY,  AND FRIDAY 45 tablet 0  . SYMBICORT 160-4.5 MCG/ACT inhaler INHALE 1 TO 2 PUFFS INTO  THE LUNGS EVERY 12 HOURS AS NEEDED 30.6 g 1  . vardenafil (LEVITRA) 20 MG tablet TAKE 1 TABLET BY MOUTH AS DIRECTED. CAN NOT BE TAKEN WITH NITROGLYCERIN. (Patient taking differently: Take 20 mg by mouth daily as needed for erectile dysfunction (Cannot be taken with Nitroglycerin). ) 6 tablet 2  . montelukast (SINGULAIR) 10 MG tablet TAKE 1 TABLET BY MOUTH  DAILY (Patient not taking: Reported on 11/30/2018) 90 tablet 2  . levofloxacin (LEVAQUIN) 500 MG tablet Take 1 tablet (500 mg total) by mouth  daily. (Patient not taking: Reported on 11/30/2018) 7 tablet 0   No facility-administered medications prior to visit.     Review of Systems  Constitutional: Negative for chills, fever, malaise/fatigue and weight loss.  HENT: Negative for hearing loss, sore throat and tinnitus.   Eyes: Negative for blurred vision and double vision.  Respiratory: Positive for cough, sputum production, shortness of breath and wheezing. Negative for hemoptysis and stridor.   Cardiovascular: Positive for leg swelling. Negative for chest pain, palpitations, orthopnea and PND.  Gastrointestinal: Negative for abdominal pain, constipation,  diarrhea, heartburn, nausea and vomiting.  Genitourinary: Negative for dysuria, hematuria and urgency.  Musculoskeletal: Negative for joint pain and myalgias.  Skin: Negative for itching and rash.  Neurological: Negative for dizziness, tingling, weakness and headaches.  Endo/Heme/Allergies: Negative for environmental allergies. Does not bruise/bleed easily.  Psychiatric/Behavioral: Negative for depression. The patient is not nervous/anxious and does not have insomnia.   All other systems reviewed and are negative.    Objective:  Physical Exam Vitals signs reviewed.  Constitutional:      General: He is not in acute distress.    Appearance: He is well-developed.  HENT:     Head: Normocephalic and atraumatic.  Eyes:     General: No scleral icterus.    Conjunctiva/sclera: Conjunctivae normal.     Pupils: Pupils are equal, round, and reactive to light.  Neck:     Musculoskeletal: Neck supple.     Vascular: No JVD.     Trachea: No tracheal deviation.  Cardiovascular:     Rate and Rhythm: Normal rate and regular rhythm.     Heart sounds: Normal heart sounds. No murmur.  Pulmonary:     Effort: Pulmonary effort is normal. No tachypnea, accessory muscle usage or respiratory distress.     Breath sounds: No stridor. Rhonchi present. No wheezing or rales.     Comments: Few rhonchi in the right base cleared with coughing, bilateral bronchial breath sounds Abdominal:     General: Bowel sounds are normal. There is no distension.     Palpations: Abdomen is soft.     Tenderness: There is no abdominal tenderness.  Musculoskeletal:        General: No tenderness.  Lymphadenopathy:     Cervical: No cervical adenopathy.  Skin:    General: Skin is warm and dry.     Capillary Refill: Capillary refill takes less than 2 seconds.     Findings: No rash.  Neurological:     Mental Status: He is alert and oriented to person, place, and time.  Psychiatric:        Behavior: Behavior normal.       Vitals:   11/30/18 1022  BP: 128/62  Pulse: 75  SpO2: 96%  Weight: 184 lb (83.5 kg)  Height: 5' 9"  (1.753 m)   96% on RA BMI Readings from Last 3 Encounters:  11/30/18 27.17 kg/m  11/17/18 27.91 kg/m  10/31/18 28.15 kg/m   Wt Readings from Last 3 Encounters:  11/30/18 184 lb (83.5 kg)  11/17/18 189 lb (85.7 kg)  10/31/18 190 lb 9.6 oz (86.5 kg)     CBC    Component Value Date/Time   WBC 6.3 07/13/2018 1027   WBC 10.8 (H) 03/25/2015 1235   RBC 5.02 07/13/2018 1027   RBC 5.27 03/25/2015 1235   HGB 14.4 07/13/2018 1027   HCT 43.1 07/13/2018 1027   PLT 298 07/13/2018 1027   MCV 86 07/13/2018 1027   MCH 28.7 07/13/2018 1027  MCHC 33.4 07/13/2018 1027   MCHC 33.1 03/25/2015 1235   RDW 14.7 07/13/2018 1027   LYMPHSABS 0.9 07/13/2018 1027   MONOABS 0.9 03/25/2015 1235   EOSABS 0.2 07/13/2018 1027   BASOSABS 0.0 07/13/2018 1027    Chest Imaging: Chest x-ray December 2019: Faint bilateral atelectatic changes.  Pulmonary Functions Testing Results:No flowsheet data found.   FeNO: None   Pathology: None   Echocardiogram:  ECHO 06/23/2018: EF 45% Impressions: - Moderately reduced systolic LV function. Septal motion consistent   with dyssynchrony. No significant change from prior.   Heart Catheterization: None     Assessment & Plan:   Wheezing - Plan: Pulmonary function test  SOB (shortness of breath) - Plan: Pulmonary function test  Atelectasis - Plan: DG Chest 2 View  Bronchitis with asthma, acute  Discussion:  This is a 77 year old gentleman with a past medical history of asthma.  Very minimal smoking history early on his young adult life.  Has had chronic bronchitis symptoms in the past.  Now presenting with a 6-week episode of upper respiratory track infection symptoms now slowly recovering with persistent cough.  Likely has post inflammatory or post viral cough.  His wheeze is now much improved.  He did have a chest x-ray with faint  atelectatic changes which can be seen in patients with asthma and air trapping and mucous plugging.  Overall since his symptomatology is improved I think he needs to continue his steroid taper that was started on by his PCP.  Recommend the following: Patient can see [place] again in 2 months with a repeat full PFTs as he has not had these and some time.  This will evaluate for any underlying airway remodeling. We will recommend continuation of his Symbicort as well as as needed levalbuterol. Patient should continue his Singulair as well. Heart failure management per his cardiologist. Patient to follow-up with his PCP regarding episodes of hypoglycemia.  He has been monitoring this closely.  Especially as his steroid taper is decreasing patient was counseled on risk of lowering his blood sugar as his steroid taper decreases. Patient to call and let us know if he has any symptoms that are worse or different.  Greater than 50% of this patient's 85-minute office visit was spent face-to-face discussing the above recommendations and treatment plan as well as reviewing the chest x-ray images that were available in epic with the patient.   Current Outpatient Medications:  .  ACCU-CHEK SOFTCLIX LANCETS lancets, Use to test blood sugar daily as directed., Disp: 100 each, Rfl: 3 .  albuterol (PROAIR HFA) 108 (90 BASE) MCG/ACT inhaler, Inhale 2 puffs into the lungs every 6 (six) hours as needed for wheezing or shortness of breath. , Disp: , Rfl:  .  albuterol (PROVENTIL) (2.5 MG/3ML) 0.083% nebulizer solution, Take 3 mLs (2.5 mg total) by nebulization every 6 (six) hours as needed for wheezing or shortness of breath., Disp: 150 mL, Rfl: 5 .  aspirin 81 MG tablet, Take 81 mg by mouth at bedtime.  , Disp: , Rfl:  .  Blood Glucose Calibration (ACCU-CHEK AVIVA) SOLN, Use as instructed to test blood sugar ICD 10 E11 51, Disp: 1 each, Rfl: 3 .  Blood Glucose Monitoring Suppl (ACCU-CHEK AVIVA PLUS) W/DEVICE KIT, Use as  directed, Disp: 1 kit, Rfl: 0 .  BYSTOLIC 5 MG tablet, TAKE 1 TABLET BY MOUTH  DAILY, Disp: 90 tablet, Rfl: 2 .  esomeprazole (NEXIUM 24HR) 20 MG capsule, Take 20 mg by mouth daily., Disp: ,  Rfl:  .  fexofenadine (ALLEGRA) 180 MG tablet, Take 180 mg by mouth daily as needed for allergies. , Disp: , Rfl:  .  glipiZIDE (GLUCOTROL) 5 MG tablet, Take 1 tablet (5 mg total) by mouth daily before breakfast., Disp: 30 tablet, Rfl: 1 .  glucose blood (ACCU-CHEK AVIVA PLUS) test strip, Use as instructed to test blood sugar 6 times daily. DX Code: E11.59, Disp: 600 each, Rfl: 3 .  ibuprofen (ADVIL,MOTRIN) 800 MG tablet, Take 800 mg by mouth every 8 (eight) hours as needed for headache or moderate pain. , Disp: , Rfl:  .  Lancet Devices (AUTOLET IMPRESSION) MISC, Use as instructed to test blood sugar ICD 10 E11 51, Disp: 1 each, Rfl: 3 .  Lancets MISC, Use to check blood sugars 6 times daily. DX Code: E11.59, Disp: 600 each, Rfl: 3 .  levalbuterol (XOPENEX) 1.25 MG/3ML nebulizer solution, Take 1.25 mg by nebulization every 4 (four) hours as needed for wheezing or shortness of breath., Disp: 72 mL, Rfl: 2 .  lisinopril-hydrochlorothiazide (PRINZIDE,ZESTORETIC) 20-12.5 MG tablet, TAKE 1 TABLET BY MOUTH  DAILY, Disp: 90 tablet, Rfl: 0 .  LORazepam (ATIVAN) 1 MG tablet, TAKE 1/2 TO 1 TABLET BY MOUTH DAILY AS NEEDED, Disp: 30 tablet, Rfl: 0 .  metFORMIN (GLUCOPHAGE) 500 MG tablet, TAKE 1 TABLET BY MOUTH  DAILY WITH BREAKFAST., Disp: 90 tablet, Rfl: 1 .  nitroGLYCERIN (NITROSTAT) 0.4 MG SL tablet, Place 0.4 mg under the tongue every 5 (five) minutes x 3 doses as needed for chest pain., Disp: , Rfl:  .  predniSONE (DELTASONE) 10 MG tablet, 6 tabs qd x 4 days, 5 tabs qd x 4 days, 4 tabs qd x 4 days,  3 tabs qd x 4 days, 2 tabs qd x 4 days, 1 tabs qd x 4 days, 1/2 tab qd x 4 days, Disp: 86 tablet, Rfl: 0 .  rosuvastatin (CRESTOR) 5 MG tablet, TAKE 1 TABLET BY MOUTH  EVERY MONDAY, WEDNESDAY,  AND FRIDAY, Disp: 45 tablet,  Rfl: 0 .  SYMBICORT 160-4.5 MCG/ACT inhaler, INHALE 1 TO 2 PUFFS INTO  THE LUNGS EVERY 12 HOURS AS NEEDED, Disp: 30.6 g, Rfl: 1 .  vardenafil (LEVITRA) 20 MG tablet, TAKE 1 TABLET BY MOUTH AS DIRECTED. CAN NOT BE TAKEN WITH NITROGLYCERIN. (Patient taking differently: Take 20 mg by mouth daily as needed for erectile dysfunction (Cannot be taken with Nitroglycerin). ), Disp: 6 tablet, Rfl: 2 .  montelukast (SINGULAIR) 10 MG tablet, TAKE 1 TABLET BY MOUTH  DAILY (Patient not taking: Reported on 11/30/2018), Disp: 90 tablet, Rfl: 2   Garner Nash, DO [facility] 11/30/2018 11:16 AM

## 2018-12-01 ENCOUNTER — Ambulatory Visit: Payer: Medicare Other | Admitting: Interventional Cardiology

## 2018-12-02 ENCOUNTER — Encounter: Payer: Self-pay | Admitting: Podiatry

## 2018-12-04 ENCOUNTER — Encounter: Payer: Self-pay | Admitting: Internal Medicine

## 2018-12-04 ENCOUNTER — Encounter: Payer: Medicare Other | Admitting: Podiatry

## 2018-12-05 NOTE — Telephone Encounter (Signed)
Dr Quay Burow to see pt message/FYI

## 2018-12-05 NOTE — Telephone Encounter (Signed)
MD is out of the office pls advise on pt [email or web address] 

## 2018-12-06 ENCOUNTER — Encounter: Payer: Self-pay | Admitting: Internal Medicine

## 2018-12-08 ENCOUNTER — Encounter: Payer: Self-pay | Admitting: Internal Medicine

## 2018-12-08 ENCOUNTER — Other Ambulatory Visit: Payer: Self-pay | Admitting: Internal Medicine

## 2018-12-08 MED ORDER — LORAZEPAM 1 MG PO TABS: ORAL_TABLET | ORAL | 0 refills | Status: DC

## 2018-12-11 ENCOUNTER — Encounter (HOSPITAL_BASED_OUTPATIENT_CLINIC_OR_DEPARTMENT_OTHER): Payer: Self-pay | Admitting: *Deleted

## 2018-12-11 ENCOUNTER — Observation Stay (HOSPITAL_BASED_OUTPATIENT_CLINIC_OR_DEPARTMENT_OTHER): Payer: Medicare Other

## 2018-12-11 ENCOUNTER — Other Ambulatory Visit: Payer: Self-pay

## 2018-12-11 ENCOUNTER — Observation Stay (HOSPITAL_COMMUNITY): Payer: Medicare Other

## 2018-12-11 ENCOUNTER — Emergency Department (HOSPITAL_BASED_OUTPATIENT_CLINIC_OR_DEPARTMENT_OTHER): Payer: Medicare Other

## 2018-12-11 ENCOUNTER — Inpatient Hospital Stay (HOSPITAL_BASED_OUTPATIENT_CLINIC_OR_DEPARTMENT_OTHER)
Admission: EM | Admit: 2018-12-11 | Discharge: 2018-12-13 | DRG: 193 | Disposition: A | Discharge status: Home / Self Care | Payer: Medicare Other | Attending: Internal Medicine | Admitting: Internal Medicine

## 2018-12-11 DIAGNOSIS — J129 Viral pneumonia, unspecified: Secondary | ICD-10-CM | POA: Diagnosis not present

## 2018-12-11 DIAGNOSIS — I10 Essential (primary) hypertension: Secondary | ICD-10-CM | POA: Diagnosis present

## 2018-12-11 DIAGNOSIS — E871 Hypo-osmolality and hyponatremia: Secondary | ICD-10-CM | POA: Diagnosis not present

## 2018-12-11 DIAGNOSIS — J9601 Acute respiratory failure with hypoxia: Secondary | ICD-10-CM | POA: Diagnosis present

## 2018-12-11 DIAGNOSIS — Z87891 Personal history of nicotine dependence: Secondary | ICD-10-CM

## 2018-12-11 DIAGNOSIS — I447 Left bundle-branch block, unspecified: Secondary | ICD-10-CM | POA: Diagnosis not present

## 2018-12-11 DIAGNOSIS — R05 Cough: Secondary | ICD-10-CM | POA: Diagnosis not present

## 2018-12-11 DIAGNOSIS — T380X5A Adverse effect of glucocorticoids and synthetic analogues, initial encounter: Secondary | ICD-10-CM | POA: Diagnosis not present

## 2018-12-11 DIAGNOSIS — E1169 Type 2 diabetes mellitus with other specified complication: Secondary | ICD-10-CM

## 2018-12-11 DIAGNOSIS — R0602 Shortness of breath: Secondary | ICD-10-CM

## 2018-12-11 DIAGNOSIS — I441 Atrioventricular block, second degree: Secondary | ICD-10-CM | POA: Diagnosis not present

## 2018-12-11 DIAGNOSIS — Z7951 Long term (current) use of inhaled steroids: Secondary | ICD-10-CM

## 2018-12-11 DIAGNOSIS — I5022 Chronic systolic (congestive) heart failure: Secondary | ICD-10-CM | POA: Diagnosis present

## 2018-12-11 DIAGNOSIS — Z7952 Long term (current) use of systemic steroids: Secondary | ICD-10-CM | POA: Diagnosis not present

## 2018-12-11 DIAGNOSIS — I110 Hypertensive heart disease with heart failure: Secondary | ICD-10-CM | POA: Diagnosis not present

## 2018-12-11 DIAGNOSIS — I2510 Atherosclerotic heart disease of native coronary artery without angina pectoris: Secondary | ICD-10-CM | POA: Diagnosis not present

## 2018-12-11 DIAGNOSIS — Z8371 Family history of colonic polyps: Secondary | ICD-10-CM

## 2018-12-11 DIAGNOSIS — Z8249 Family history of ischemic heart disease and other diseases of the circulatory system: Secondary | ICD-10-CM

## 2018-12-11 DIAGNOSIS — E1159 Type 2 diabetes mellitus with other circulatory complications: Secondary | ICD-10-CM | POA: Diagnosis not present

## 2018-12-11 DIAGNOSIS — K219 Gastro-esophageal reflux disease without esophagitis: Secondary | ICD-10-CM | POA: Diagnosis present

## 2018-12-11 DIAGNOSIS — B9789 Other viral agents as the cause of diseases classified elsewhere: Secondary | ICD-10-CM | POA: Diagnosis present

## 2018-12-11 DIAGNOSIS — Z79899 Other long term (current) drug therapy: Secondary | ICD-10-CM | POA: Diagnosis not present

## 2018-12-11 DIAGNOSIS — Z7982 Long term (current) use of aspirin: Secondary | ICD-10-CM | POA: Diagnosis not present

## 2018-12-11 DIAGNOSIS — R7989 Other specified abnormal findings of blood chemistry: Secondary | ICD-10-CM | POA: Diagnosis not present

## 2018-12-11 DIAGNOSIS — Z91041 Radiographic dye allergy status: Secondary | ICD-10-CM

## 2018-12-11 DIAGNOSIS — I252 Old myocardial infarction: Secondary | ICD-10-CM | POA: Diagnosis not present

## 2018-12-11 DIAGNOSIS — J45901 Unspecified asthma with (acute) exacerbation: Secondary | ICD-10-CM | POA: Diagnosis present

## 2018-12-11 DIAGNOSIS — J45909 Unspecified asthma, uncomplicated: Secondary | ICD-10-CM | POA: Diagnosis present

## 2018-12-11 DIAGNOSIS — R0902 Hypoxemia: Secondary | ICD-10-CM | POA: Diagnosis not present

## 2018-12-11 DIAGNOSIS — E785 Hyperlipidemia, unspecified: Secondary | ICD-10-CM | POA: Diagnosis not present

## 2018-12-11 DIAGNOSIS — Z950 Presence of cardiac pacemaker: Secondary | ICD-10-CM | POA: Diagnosis not present

## 2018-12-11 DIAGNOSIS — M21372 Foot drop, left foot: Secondary | ICD-10-CM | POA: Diagnosis present

## 2018-12-11 LAB — CBC WITH DIFFERENTIAL/PLATELET
Abs Immature Granulocytes: 0.22 10*3/uL — ABNORMAL HIGH (ref 0.00–0.07)
Basophils Absolute: 0 10*3/uL (ref 0.0–0.1)
Basophils Relative: 0 %
Eosinophils Absolute: 0.3 10*3/uL (ref 0.0–0.5)
Eosinophils Relative: 4 %
HCT: 38.9 % — ABNORMAL LOW (ref 39.0–52.0)
Hemoglobin: 12.3 g/dL — ABNORMAL LOW (ref 13.0–17.0)
Immature Granulocytes: 3 %
Lymphocytes Relative: 5 %
Lymphs Abs: 0.5 10*3/uL — ABNORMAL LOW (ref 0.7–4.0)
MCH: 27.5 pg (ref 26.0–34.0)
MCHC: 31.6 g/dL (ref 30.0–36.0)
MCV: 86.8 fL (ref 80.0–100.0)
Monocytes Absolute: 0.4 10*3/uL (ref 0.1–1.0)
Monocytes Relative: 5 %
Neutro Abs: 6.9 10*3/uL (ref 1.7–7.7)
Neutrophils Relative %: 83 %
Platelets: 190 10*3/uL (ref 150–400)
RBC: 4.48 MIL/uL (ref 4.22–5.81)
RDW: 16 % — ABNORMAL HIGH (ref 11.5–15.5)
WBC: 8.4 10*3/uL (ref 4.0–10.5)
nRBC: 0 % (ref 0.0–0.2)

## 2018-12-11 LAB — TROPONIN I
TROPONIN I: 0.03 ng/mL — AB (ref <0.03)
Troponin I: 0.04 ng/mL
Troponin I: 0.04 ng/mL (ref <0.03)

## 2018-12-11 LAB — COMPREHENSIVE METABOLIC PANEL
ALT: 31 U/L (ref 0–44)
ANION GAP: 6 (ref 5–15)
AST: 21 U/L (ref 15–41)
Albumin: 3 g/dL — ABNORMAL LOW (ref 3.5–5.0)
Alkaline Phosphatase: 52 U/L (ref 38–126)
BUN: 27 mg/dL — ABNORMAL HIGH (ref 8–23)
CALCIUM: 9 mg/dL (ref 8.9–10.3)
CO2: 28 mmol/L (ref 22–32)
Chloride: 98 mmol/L (ref 98–111)
Creatinine, Ser: 1.07 mg/dL (ref 0.61–1.24)
GFR calc Af Amer: >60 mL/min (ref >60)
GFR calc non Af Amer: >60 mL/min (ref >60)
Glucose, Bld: 115 mg/dL — ABNORMAL HIGH (ref 70–99)
Potassium: 5.1 mmol/L (ref 3.5–5.1)
Sodium: 132 mmol/L — ABNORMAL LOW (ref 135–145)
Total Bilirubin: 0.6 mg/dL (ref 0.3–1.2)
Total Protein: 6.7 g/dL (ref 6.5–8.1)

## 2018-12-11 LAB — INFLUENZA PANEL BY PCR (TYPE A & B)
Influenza A By PCR: NEGATIVE
Influenza B By PCR: NEGATIVE

## 2018-12-11 LAB — BRAIN NATRIURETIC PEPTIDE: B Natriuretic Peptide: 79.8 pg/mL (ref 0.0–100.0)

## 2018-12-11 LAB — PROCALCITONIN: Procalcitonin: 0.13 ng/mL

## 2018-12-11 MED ORDER — ASPIRIN 81 MG PO CHEW
81.0000 mg | CHEWABLE_TABLET | Freq: Every day | ORAL | Status: DC
  Administered 2018-12-12: 81 mg via ORAL
  Filled 2018-12-11: qty 1

## 2018-12-11 MED ORDER — DIPHENHYDRAMINE HCL 50 MG/ML IJ SOLN
50.0000 mg | Freq: Once | INTRAMUSCULAR | Status: AC
  Administered 2018-12-11: 50 mg via INTRAVENOUS

## 2018-12-11 MED ORDER — HYDROCODONE-ACETAMINOPHEN 7.5-325 MG PO TABS: 1.0000 | ORAL_TABLET | Freq: Four times a day (QID) | ORAL | Status: DC | PRN

## 2018-12-11 MED ORDER — PANTOPRAZOLE SODIUM 40 MG PO TBEC
40.0000 mg | DELAYED_RELEASE_TABLET | Freq: Every day | ORAL | Status: DC
  Administered 2018-12-12 – 2018-12-13 (×2): 40 mg via ORAL
  Filled 2018-12-11 (×2): qty 1

## 2018-12-11 MED ORDER — LEVALBUTEROL HCL 1.25 MG/0.5ML IN NEBU
1.2500 mg | INHALATION_SOLUTION | Freq: Four times a day (QID) | RESPIRATORY_TRACT | Status: DC
  Administered 2018-12-11 – 2018-12-13 (×7): 1.25 mg via RESPIRATORY_TRACT
  Filled 2018-12-11 (×7): qty 0.5

## 2018-12-11 MED ORDER — HYDROCORTISONE NA SUCCINATE PF 250 MG IJ SOLR
200.0000 mg | Freq: Once | INTRAMUSCULAR | Status: DC
  Filled 2018-12-11: qty 200

## 2018-12-11 MED ORDER — DIPHENHYDRAMINE HCL 50 MG/ML IJ SOLN
50.0000 mg | Freq: Once | INTRAMUSCULAR | Status: AC
  Administered 2018-12-11: 50 mg via INTRAVENOUS
  Filled 2018-12-11: qty 1

## 2018-12-11 MED ORDER — LEVALBUTEROL HCL 1.25 MG/0.5ML IN NEBU: 1.2500 mg | INHALATION_SOLUTION | Freq: Four times a day (QID) | RESPIRATORY_TRACT | Status: DC | PRN

## 2018-12-11 MED ORDER — DM-GUAIFENESIN ER 30-600 MG PO TB12
2.0000 | ORAL_TABLET | Freq: Two times a day (BID) | ORAL | Status: DC
  Administered 2018-12-12 – 2018-12-13 (×4): 2 via ORAL
  Filled 2018-12-11 (×2): qty 2
  Filled 2018-12-11 (×2): qty 1
  Filled 2018-12-11: qty 2

## 2018-12-11 MED ORDER — ACETAMINOPHEN 325 MG PO TABS: 650.0000 mg | ORAL_TABLET | Freq: Four times a day (QID) | ORAL | Status: DC | PRN

## 2018-12-11 MED ORDER — LEVALBUTEROL HCL 1.25 MG/0.5ML IN NEBU
1.2500 mg | INHALATION_SOLUTION | Freq: Once | RESPIRATORY_TRACT | Status: AC
  Administered 2018-12-11: 1.25 mg via RESPIRATORY_TRACT
  Filled 2018-12-11: qty 0.5

## 2018-12-11 MED ORDER — IPRATROPIUM BROMIDE 0.02 % IN SOLN
0.5000 mg | Freq: Four times a day (QID) | RESPIRATORY_TRACT | Status: DC
  Administered 2018-12-11 – 2018-12-13 (×7): 0.5 mg via RESPIRATORY_TRACT
  Filled 2018-12-11 (×7): qty 2.5

## 2018-12-11 MED ORDER — LORATADINE 10 MG PO TABS: 10.0000 mg | ORAL_TABLET | Freq: Every day | ORAL | Status: DC | PRN

## 2018-12-11 MED ORDER — ALBUTEROL SULFATE (2.5 MG/3ML) 0.083% IN NEBU: 2.5000 mg | INHALATION_SOLUTION | Freq: Four times a day (QID) | RESPIRATORY_TRACT | Status: DC | PRN

## 2018-12-11 MED ORDER — METHYLPREDNISOLONE SODIUM SUCC 125 MG IJ SOLR
60.0000 mg | Freq: Three times a day (TID) | INTRAMUSCULAR | Status: DC
  Administered 2018-12-12 – 2018-12-13 (×4): 60 mg via INTRAVENOUS
  Filled 2018-12-11 (×4): qty 2

## 2018-12-11 MED ORDER — IOPAMIDOL (ISOVUE-370) INJECTION 76%
INTRAVENOUS | Status: AC
  Filled 2018-12-11: qty 100

## 2018-12-11 MED ORDER — ACETAMINOPHEN 650 MG RE SUPP: 650.0000 mg | Freq: Four times a day (QID) | RECTAL | Status: DC | PRN

## 2018-12-11 MED ORDER — DIPHENHYDRAMINE HCL 25 MG PO CAPS: 50.0000 mg | ORAL_CAPSULE | Freq: Once | ORAL | Status: AC

## 2018-12-11 MED ORDER — HYDROCORTISONE NA SUCCINATE PF 100 MG IJ SOLR
INTRAMUSCULAR | Status: AC
  Administered 2018-12-11: 200 mg
  Filled 2018-12-11: qty 2

## 2018-12-11 MED ORDER — INSULIN ASPART 100 UNIT/ML ~~LOC~~ SOLN
0.0000 [IU] | Freq: Three times a day (TID) | SUBCUTANEOUS | Status: DC
  Administered 2018-12-12: 5 [IU] via SUBCUTANEOUS
  Administered 2018-12-12 (×2): 2 [IU] via SUBCUTANEOUS
  Administered 2018-12-13: 3 [IU] via SUBCUTANEOUS

## 2018-12-11 MED ORDER — MOMETASONE FURO-FORMOTEROL FUM 200-5 MCG/ACT IN AERO
2.0000 | INHALATION_SPRAY | Freq: Two times a day (BID) | RESPIRATORY_TRACT | Status: DC
  Administered 2018-12-11 – 2018-12-13 (×4): 2 via RESPIRATORY_TRACT
  Filled 2018-12-11: qty 8.8

## 2018-12-11 MED ORDER — ENOXAPARIN SODIUM 40 MG/0.4ML ~~LOC~~ SOLN
40.0000 mg | SUBCUTANEOUS | Status: DC
  Administered 2018-12-12 (×2): 40 mg via SUBCUTANEOUS
  Filled 2018-12-11 (×2): qty 0.4

## 2018-12-11 MED ORDER — SODIUM CHLORIDE 0.9 % IV BOLUS
1000.0000 mL | Freq: Once | INTRAVENOUS | Status: AC
  Administered 2018-12-11: 1000 mL via INTRAVENOUS

## 2018-12-11 MED ORDER — ROSUVASTATIN CALCIUM 5 MG PO TABS
5.0000 mg | ORAL_TABLET | ORAL | Status: DC
  Administered 2018-12-13: 5 mg via ORAL
  Filled 2018-12-11: qty 1

## 2018-12-11 MED ORDER — SODIUM CHLORIDE 0.9 % IV BOLUS
500.0000 mL | Freq: Once | INTRAVENOUS | Status: AC
  Administered 2018-12-11: 500 mL via INTRAVENOUS

## 2018-12-11 MED ORDER — IOPAMIDOL (ISOVUE-370) INJECTION 76%
75.0000 mL | Freq: Once | INTRAVENOUS | Status: AC | PRN
  Administered 2018-12-11: 75 mL via INTRAVENOUS

## 2018-12-11 MED ORDER — DIPHENHYDRAMINE HCL 50 MG/ML IJ SOLN
INTRAMUSCULAR | Status: AC
  Filled 2018-12-11: qty 1

## 2018-12-11 MED ORDER — HYDROCODONE-ACETAMINOPHEN 7.5-325 MG PO TABS: 1.0000 | ORAL_TABLET | Freq: Four times a day (QID) | ORAL | 0 refills | Status: DC | PRN

## 2018-12-11 NOTE — ED Triage Notes (Signed)
Coughing since October. "it hurts when I breathe."  Has been on steroid and antibiotic.  Per patient and wife, he has foot drop to left foot for 15 days now since he was on antibiotic.

## 2018-12-11 NOTE — ED Notes (Signed)
Date and time results received: 12/11/18 1658  Test: Troponin Critical Value: 0.04  Name of Provider Notified: [facility]  Orders Received? Or Actions Taken?: EDP notified

## 2018-12-11 NOTE — ED Notes (Signed)
Date and time results received: 12/11/18 12:45 PM  Test: Troponin Critical Value: 0.04  Name of Provider Notified: [facility]  Orders Received? Or Actions Taken?: EDP made aware

## 2018-12-11 NOTE — Patient Outreach (Signed)
[facility]) Care Management  12/11/2018  Everette Mall [facility] 04/11/1941 [id number]   Medication Adherence call back form Mr. Kieffer Blatz patient call back patient does not want to order Rosuvastatin 5 mg he said he is only takes it 3 times a week and has plenty at this time. Mr. Pompey is showing past due on Rosuvastatin 5 mg under [place].    [place] Management Direct Dial [phone]  Fax [phone] [email or web address]

## 2018-12-11 NOTE — ED Provider Notes (Signed)
Emergency Department Provider Note   I have reviewed the triage vital signs and the nursing notes.   HISTORY  Chief Complaint Cough   HPI Jeffery [name] is a 77 y.o. male with PMH of CAD, CHF, GERD, HLD, and DM presents to the emergency department with persistent respiratory infection symptoms and multiple rounds of antibiotics, steroid.  Symptoms began in late October.  Patient has been to his primary care physician several times and most recently was on Levaquin.  He finished this course 3 weeks ago and has since followed up with a pulmonologist.  The pulmonologist placed him on additional steroid medications but he continues to have symptoms.  2 weeks ago he developed foot drop on the left.  Patient states he noticed that he was weak in the left foot only with raising the toe up.  He is able to walk without difficulty.  No pain in the leg.  Symptoms began shortly after stopping the Levaquin.  He has not experienced any tendon pain.  His cough symptoms continue.  He has mildly productive cough with no hemoptysis.  He states he continues to feel very fatigued and worn out.   Past Medical History:  Diagnosis Date  . Arthritis    "thumbs" (07/25/2018)  . Asthma   . CHF (congestive heart failure) ([place])   . Chronic bronchitis ([place])   . Environmental allergies   . GERD (gastroesophageal reflux disease)   . Headache    "seasonal; w/environmental allergies" (07/25/2018)  . History of blood transfusion    "when I had laminectomy" (07/25/2018)  . HTN (hypertension)   . Hyperlipidemia   . LBBB (left bundle branch block) 1999  . Myocardial infarction [facility])    "was told I've had an old MI; probably in the 13s" (07/25/2018)  . Pneumonia    "as a child, age 67; viral pneumonia 3 times in the last 10 years" (07/25/2018)  . Presence of permanent cardiac pacemaker 07/25/2018  . Seasonal allergies   . Sleep apnea    "wife says I do" (07/25/2018)  . Type II diabetes mellitus [facility])      Patient Active Problem List   Diagnosis Date Noted  . Acute respiratory failure with hypoxia ([place]) 12/11/2018  . Community acquired pneumonia 11/18/2018  . Moderate persistent asthma with exacerbation 10/27/2018  . Folliculitis 49/67/5916  . Chronic systolic heart failure ([place]) 07/25/2018  . Anxiety 03/26/2018  . Metatarsal bone fracture 03/07/2018  . Encounter for chronic pain management 04/13/2017  . Osteoarthritis 04/12/2017  . Chronic combined systolic and diastolic CHF (congestive heart failure) ([place]) 01/24/2015  . OSA (obstructive sleep apnea) 10/03/2014  . Left bundle branch block 11/27/2013  . Essential hypertension 11/27/2013  . GERD (gastroesophageal reflux disease) 02/27/2013  . Type 2 diabetes mellitus with vascular disease ([place]) 04/09/2009  . Coronary atherosclerosis 09/06/2008  . Asthma 09/06/2008    Past Surgical History:  Procedure Laterality Date  . BACK SURGERY    . BIV PACEMAKER INSERTION CRT-P  07/25/2018  . BIV PACEMAKER INSERTION CRT-P N/A 07/25/2018   Procedure: BIV PACEMAKER INSERTION CRT-P;  Surgeon: Evans Lance, MD;  Location: [place] [facility];  Service: Cardiovascular;  Laterality: N/A;  . CARDIAC CATHETERIZATION  2003   Dr Pernell Dupre; 85 % R circumflex obstruction  . CARPAL TUNNEL RELEASE Left 10/11/2014   Procedure: LEFT CARPAL TUNNEL RELEASE;  Surgeon: Roseanne Kaufman, MD;  Location: [place];  Service: Orthopedics;  Laterality: Left;  . COLONOSCOPY W/ BIOPSIES  AND POLYPECTOMY  2018  . INGUINAL HERNIA REPAIR Right 1948  . INGUINAL HERNIA REPAIR Left 1988  . LUMBAR LAMINECTOMY  1984  . MINOR CARPAL TUNNEL Right 11/22/2014   Procedure: RIGHT LIMITED OPEN CARPAL TUNNEL RELEASE;  Surgeon: Roseanne Kaufman, MD;  Location: [place];  Service: Orthopedics;  Laterality: Right;  . TONSILLECTOMY  1958   Allergies Contrast media [iodinated diagnostic agents]  Family History  Problem Relation Age of Onset  . Heart  attack Father        75s  . Colon polyps Father   . Heart failure Mother        90s  . Subarachnoid hemorrhage Mother 79  . Hypertension Mother   . Subarachnoid hemorrhage Paternal Grandfather 89  . Diabetes Maternal Aunt   . Colon cancer Neg Hx     Social History Social History   Tobacco Use  . Smoking status: Former Smoker    Packs/day: 2.00    Years: 2.00    Pack years: 4.00    Types: Cigarettes    Last attempt to quit: 12/14/1963    Years since quitting: 55.0  . Smokeless tobacco: Never Used  Substance Use Topics  . Alcohol use: Not Currently    Alcohol/week: 2.0 standard drinks    Types: 2 Glasses of wine per week    Comment: occasional  . Drug use: Never    Review of Systems  Constitutional: No fever/chills. Positive fatigue.  Eyes: No visual changes. ENT: No sore throat. Cardiovascular: Chest pain with coughing.  Respiratory: Positive shortness of breath and persistent cough.  Gastrointestinal: No abdominal pain.  No nausea, no vomiting.  No diarrhea.  No constipation. Genitourinary: Negative for dysuria. Musculoskeletal: Negative for back pain. Skin: Negative for rash. Neurological: Negative for headaches, focal weakness or numbness.  10-point ROS otherwise negative.  ____________________________________________   PHYSICAL EXAM:  VITAL SIGNS: Vitals:   12/11/18 1400 12/11/18 1430  BP: 98/64 (!) 89/56  Pulse: 97 98  Resp: 11 13  SpO2: 91% 93%    Constitutional: Alert and oriented. Well appearing and in no acute distress. Eyes: Conjunctivae are normal.  Head: Atraumatic. Nose: No congestion/rhinnorhea. Mouth/Throat: Mucous membranes are slightly dry.    Neck: No stridor.  Cardiovascular: Normal rate, regular rhythm. Good peripheral circulation. Grossly normal heart sounds.   Respiratory: Mild increased respiratory effort.  No retractions. Lungs with no wheezing, rales, or rhonchi.  Gastrointestinal: Soft and nontender. No distention.    Musculoskeletal: No lower extremity tenderness nor edema. No gross deformities of extremities. Neurologic:  Normal speech and language. No gross focal neurologic deficits are appreciated.  Skin:  Skin is warm, dry and intact. No rash noted.  ____________________________________________   LABS (all labs ordered are listed, but only abnormal results are displayed)  Labs Reviewed  COMPREHENSIVE METABOLIC PANEL - Abnormal; Notable for the following components:      Result Value   Sodium 132 (*)    Glucose, Bld 115 (*)    BUN 27 (*)    Albumin 3.0 (*)    All other components within normal limits  TROPONIN I - Abnormal; Notable for the following components:   Troponin I 0.04 (*)    All other components within normal limits  CBC WITH DIFFERENTIAL/PLATELET - Abnormal; Notable for the following components:   Hemoglobin 12.3 (*)    HCT 38.9 (*)    RDW 16.0 (*)    Lymphs Abs 0.5 (*)    Abs Immature Granulocytes 0.22 (*)  All other components within normal limits  BRAIN NATRIURETIC PEPTIDE  INFLUENZA PANEL BY PCR (TYPE A & B)   ____________________________________________  EKG   EKG Interpretation  Date/Time:  Monday December 11 2018 10:53:07 EST Ventricular Rate:  106 PR Interval:    QRS Duration: 119 QT Interval:  347 QTC Calculation: 461 R Axis:   -145 Text Interpretation:  Atrial-sensed ventricular-paced rhythm No further analysis attempted due to paced rhythm Confirmed by Nanda Quinton [phone]) on 12/11/2018 12:28:31 PM Also confirmed by Nanda Quinton [phone]), editor Philomena Doheny [phone])  on 12/11/2018 12:54:07 PM       ____________________________________________  RADIOLOGY  Dg Chest 2 View  Result Date: 12/11/2018 CLINICAL DATA:  Cough and congestion. EXAM: CHEST - 2 VIEW COMPARISON:  11/17/2018 FINDINGS: There is a left chest wall pacer device with leads in the right atrial appendage and right ventricle. Normal heart size. No pleural effusion or edema. No airspace  opacities identified. Osseous structures are unremarkable. IMPRESSION: No active cardiopulmonary disease. Electronically Signed   By: Kerby Moors M.D.   On: 12/11/2018 12:01    ____________________________________________   PROCEDURES  Procedure(s) performed:   Procedures  CRITICAL CARE Performed by: Margette Fast Total critical care time: 35 minutes Critical care time was exclusive of separately billable procedures and treating other patients. Critical care was necessary to treat or prevent imminent or life-threatening deterioration. Critical care was time spent personally by me on the following activities: development of treatment plan with patient and/or surrogate as well as nursing, discussions with consultants, evaluation of patient's response to treatment, examination of patient, obtaining history from patient or surrogate, ordering and performing treatments and interventions, ordering and review of laboratory studies, ordering and review of radiographic studies, pulse oximetry and re-evaluation of patient's condition.  Nanda Quinton, MD Emergency Medicine  ____________________________________________   INITIAL IMPRESSION / ASSESSMENT AND PLAN / ED COURSE  Pertinent labs & imaging results that were available during my care of the patient were reviewed by me and considered in my medical decision making (see chart for details).  Patient presents to the emergency department for evaluation of cough, congestion, generalized weakness, and left foot drop.  Cough and congestion symptoms have been going on for nearly 2 months at this point.  Has been on multiple rounds of antibiotic and steroid with no significant or lasting relief.  He has developed a foot drop on the left 2 weeks ago.  Unclear if this is related to his diabetes, Levaquin use, or other etiology.  Normal drink with plantar flexion.  No tenderness along the knee. No other neuro deficits.   Labs with no significant  abnormality other than mild elevation in troponin.  EKG is difficult to interpret as it is circularly paced.  Patient is not having active chest pain.  Blood pressure is slightly soft with systolics in the 09F to occasional high 80s.  Patient is awake and alert.  Chest x-ray shows no acute findings.  Plan for CT Angie of the chest to evaluate for possible PE but patient has a listed contrast dye allergy.  He will need pretreatment.  He is receiving Xopenex nebs here for some shortness of breath symptoms.  Plan for admission.   Discussed patient's case with Hospitalist, Dr. Lorin Mercy to request admission. Patient and family (if present) updated with plan. Care transferred to Hospitalist service.  I reviewed all nursing notes, vitals, pertinent old records, EKGs, labs, imaging (as available).  ____________________________________________  FINAL CLINICAL IMPRESSION(S) / ED DIAGNOSES  Final diagnoses:  SOB (shortness of breath)  Hypoxemia    MEDICATIONS GIVEN DURING THIS VISIT:  Medications  hydrocortisone sodium succinate (SOLU-CORTEF) injection 200 mg (has no administration in time range)  sodium chloride 0.9 % bolus 500 mL (0 mLs Intravenous Stopped 12/11/18 1301)  levalbuterol (XOPENEX) nebulizer solution 1.25 mg (1.25 mg Nebulization Given 12/11/18 1310)  diphenhydrAMINE (BENADRYL) capsule 50 mg ( Oral See Alternative 12/11/18 1418)    Or  diphenhydrAMINE (BENADRYL) injection 50 mg (50 mg Intravenous Given 12/11/18 1418)  sodium chloride 0.9 % bolus 1,000 mL (1,000 mLs Intravenous New Bag/Given 12/11/18 1416)  hydrocortisone sodium succinate (SOLU-CORTEF) 100 MG injection (200 mg  Given 12/11/18 1417)    Note:  This document was prepared using Dragon voice recognition software and may include unintentional dictation errors.  Nanda Quinton, MD Emergency Medicine    Long, [facility], MD 12/11/18 [phone]

## 2018-12-11 NOTE — H&P (Signed)
History and Physical    Jeffery [name] AJO:[id number] DOB: 1941/06/29 DOA: 12/11/2018  PCP: [name], MD Patient coming from: Home  Chief Complaint: Shortness of breath, cough  HPI: Jeffery [name] is a 78 y.o. male with medical history significant of CAD, asthma, CHF, GERD, hypertension, hyperlipidemia, type 2 diabetes presenting to the hospital for evaluation of shortness of breath and cough.  Patient reports having shortness of breath and cough since the end of October.  States he has taken 3 rounds of antibiotics and 3 rounds of steroids since then.  He saw pulmonologist 2 weeks ago.  Reports having cough productive of sputum and dyspnea even at rest.  Reports having chest tightness and it feels like he cannot move air.  He has been having a runny nose.  States his ribs feel sore from coughing.  Denies having any chest pain.  Denies prior history of blood clots.  States he took Levaquin previously for 7 days and then 1 week after finishing the course he noticed mild left-sided foot drop.  He is having difficulty dorsiflexing his foot but has been walking okay.  No tendon pain.  Does not report any injury to the foot.  ED Course: Tachycardic initially, now improved.  Blood pressure soft with systolic in 94B to 09G, improved with 1.5 L IV fluid bolus. SPO2 80% on room air.  No leukocytosis.  BNP normal.  Influenza panel negative.  Troponin 0.04 >0.04.  EKG with paced rhythm and difficult to interpret.  Chest x-ray showing no active cardiopulmonary disease.  Patient has contrast dye allergy and received Benadryl and Solu-Cortef in the ED.  Patient was transferred to [facility] and CT angiogram pending.  Review of Systems: As per HPI otherwise 10 point review of systems negative.  Past Medical History:  Diagnosis Date  . Arthritis    "thumbs" (07/25/2018)  . Asthma   . CHF (congestive heart failure) ([place])   . Chronic bronchitis ([place])   . Environmental allergies   . GERD  (gastroesophageal reflux disease)   . Headache    "seasonal; w/environmental allergies" (07/25/2018)  . History of blood transfusion    "when I had laminectomy" (07/25/2018)  . HTN (hypertension)   . Hyperlipidemia   . LBBB (left bundle branch block) 1999  . Myocardial infarction [facility])    "was told I've had an old MI; probably in the 17s" (07/25/2018)  . Pneumonia    "as a child, age 54; viral pneumonia 3 times in the last 10 years" (07/25/2018)  . Presence of permanent cardiac pacemaker 07/25/2018  . Seasonal allergies   . Sleep apnea    "wife says I do" (07/25/2018)  . Type II diabetes mellitus ([place])     Past Surgical History:  Procedure Laterality Date  . BACK SURGERY    . BIV PACEMAKER INSERTION CRT-P  07/25/2018  . BIV PACEMAKER INSERTION CRT-P N/A 07/25/2018   Procedure: BIV PACEMAKER INSERTION CRT-P;  Surgeon: Evans Lance, MD;  Location: [place] [facility];  Service: Cardiovascular;  Laterality: N/A;  . CARDIAC CATHETERIZATION  2003   Dr Pernell Dupre; 85 % R circumflex obstruction  . CARPAL TUNNEL RELEASE Left 10/11/2014   Procedure: LEFT CARPAL TUNNEL RELEASE;  Surgeon: Roseanne Kaufman, MD;  Location: [place];  Service: Orthopedics;  Laterality: Left;  . COLONOSCOPY W/ BIOPSIES AND POLYPECTOMY  2018  . INGUINAL HERNIA REPAIR Right 1948  . INGUINAL HERNIA REPAIR Left 1988  . LUMBAR LAMINECTOMY  1984  .  MINOR CARPAL TUNNEL Right 11/22/2014   Procedure: RIGHT LIMITED OPEN CARPAL TUNNEL RELEASE;  Surgeon: Roseanne Kaufman, MD;  Location: [place];  Service: Orthopedics;  Laterality: Right;  . TONSILLECTOMY  1958     reports that he quit smoking about 55 years ago. His smoking use included cigarettes. He has a 4.00 pack-year smoking history. He has never used smokeless tobacco. He reports previous alcohol use of about 2.0 standard drinks of alcohol per week. He reports that he does not use drugs.  Allergies  Allergen Reactions  . Contrast  Media [Iodinated Diagnostic Agents] Other (See Comments)    Redness and warm sensation, this reaction was noted on 02/27/13 during a Cardiac MRI per pt.  Pt sts he had erythema on his head, chest, and shoulders.  Pt had a pacemaker placed August 2019 and was pre-medicated for the dye and had no allergic reaction at that time. -Carissa Mozingo B.S. RT(R)(CT)    Family History  Problem Relation Age of Onset  . Heart attack Father        38s  . Colon polyps Father   . Heart failure Mother        90s  . Subarachnoid hemorrhage Mother 81  . Hypertension Mother   . Subarachnoid hemorrhage Paternal Grandfather 49  . Diabetes Maternal Aunt   . Colon cancer Neg Hx     Prior to Admission medications   Medication Sig Start Date End Date Taking? Authorizing Provider  albuterol (PROAIR HFA) 108 (90 BASE) MCG/ACT inhaler Inhale 2 puffs into the lungs every 6 (six) hours as needed for wheezing or shortness of breath.    Yes [provider]  albuterol (PROVENTIL) (2.5 MG/3ML) 0.083% nebulizer solution Take 3 mLs (2.5 mg total) by nebulization every 6 (six) hours as needed for wheezing or shortness of breath. 10/19/18  Yes Burns, Claudina Lick, MD  aspirin 81 MG tablet Take 81 mg by mouth at bedtime.     Yes [provider]  BYSTOLIC 5 MG tablet TAKE 1 TABLET BY MOUTH  DAILY 10/30/18  Yes Deboraha Sprang, MD  esomeprazole (NEXIUM 24HR) 20 MG capsule Take 20 mg by mouth daily.   Yes [provider]  fexofenadine (ALLEGRA) 180 MG tablet Take 180 mg by mouth daily as needed for allergies.    Yes [provider]  levalbuterol (XOPENEX) 1.25 MG/3ML nebulizer solution Take 1.25 mg by nebulization every 4 (four) hours as needed for wheezing or shortness of breath. 10/17/18  Yes [name], MD  lisinopril-hydrochlorothiazide (PRINZIDE,ZESTORETIC) 20-12.5 MG tablet TAKE 1 TABLET BY MOUTH  DAILY 10/16/18  Yes Belva Crome, MD  LORazepam (ATIVAN) 1 MG tablet TAKE 1/2 TO 1 TABLET BY  MOUTH DAILY AS NEEDED 12/08/18  Yes Marrian Salvage, FNP  metFORMIN (GLUCOPHAGE) 500 MG tablet TAKE 1 TABLET BY MOUTH  DAILY WITH BREAKFAST. 11/20/18  Yes Burns, Claudina Lick, MD  predniSONE (DELTASONE) 10 MG tablet 6 tabs qd x 4 days, 5 tabs qd x 4 days, 4 tabs qd x 4 days,  3 tabs qd x 4 days, 2 tabs qd x 4 days, 1 tabs qd x 4 days, 1/2 tab qd x 4 days 11/17/18  Yes Burns, Claudina Lick, MD  rosuvastatin (CRESTOR) 5 MG tablet TAKE 1 TABLET BY MOUTH  EVERY MONDAY, WEDNESDAY,  AND FRIDAY 10/16/18  Yes Belva Crome, MD  SYMBICORT 160-4.5 MCG/ACT inhaler INHALE 1 TO 2 PUFFS INTO  THE LUNGS EVERY 12 HOURS AS  NEEDED 09/28/18  Yes Burns, Claudina Lick, MD  ACCU-CHEK SOFTCLIX LANCETS lancets Use to test blood sugar daily as directed. 09/15/15   Hendricks Limes, MD  Blood Glucose Calibration (ACCU-CHEK AVIVA) SOLN Use as instructed to test blood sugar ICD 10 E11 51 12/09/14   Hendricks Limes, MD  Blood Glucose Monitoring Suppl (ACCU-CHEK AVIVA PLUS) W/DEVICE KIT Use as directed 09/15/15   Hendricks Limes, MD  glipiZIDE (GLUCOTROL) 5 MG tablet Take 1 tablet (5 mg total) by mouth daily before breakfast. 11/10/18   Burns, Claudina Lick, MD  glucose blood (ACCU-CHEK AVIVA PLUS) test strip Use as instructed to test blood sugar 6 times daily. DX Code: E11.59 11/07/18   [name], MD  HYDROcodone-acetaminophen (NORCO) 7.5-325 MG tablet Take 1 tablet by mouth every 6 (six) hours as needed (for chronic joint pain). 12/11/18   [name], MD  ibuprofen (ADVIL,MOTRIN) 800 MG tablet Take 800 mg by mouth every 8 (eight) hours as needed for headache or moderate pain.     [provider]  Lancet Devices (AUTOLET IMPRESSION) MISC Use as instructed to test blood sugar ICD 10 E11 51 12/09/14   Hendricks Limes, MD  Lancets MISC Use to check blood sugars 6 times daily. DX Code: E11.59 11/07/18   [name], MD  montelukast (SINGULAIR) 10 MG tablet TAKE 1 TABLET BY MOUTH  DAILY Patient not taking: Reported on  11/30/2018 06/14/18   [name], MD  nitroGLYCERIN (NITROSTAT) 0.4 MG SL tablet Place 0.4 mg under the tongue every 5 (five) minutes x 3 doses as needed for chest pain.    [provider]  vardenafil (LEVITRA) 20 MG tablet TAKE 1 TABLET BY MOUTH AS DIRECTED. CAN NOT BE TAKEN WITH NITROGLYCERIN. Patient taking differently: Take 20 mg by mouth daily as needed for erectile dysfunction (Cannot be taken with Nitroglycerin).  06/19/18   [name], MD    Physical Exam: Vitals:   12/11/18 1530 12/11/18 1600 12/11/18 1630 12/11/18 1838  BP: 116/70 118/70 105/62   Pulse: 85 84 86   Resp: (!) _0 SpO2: (!) 89% (!) 87% 92%   Weight:    81 kg  Height:    _1  (1.753 m)    Physical Exam  Constitutional: He is oriented to person, place, and time. He appears well-developed and well-nourished. No distress.  HENT:  Head: Normocephalic.  Mouth/Throat: Oropharynx is clear and moist.  Eyes: Right eye exhibits no discharge. Left eye exhibits no discharge.  Neck: Neck supple.  Cardiovascular: Normal rate, regular rhythm and intact distal pulses.  Pulmonary/Chest: He has wheezes. He has no rales.  Patient clearly in full sentences No increased work of breathing Breathing comfortably on room air  Abdominal: Soft. Bowel sounds are normal. He exhibits no distension. There is no abdominal tenderness. There is no guarding.  Musculoskeletal:        General: No edema.  Neurological: He is alert and oriented to person, place, and time.  Weakness with dorsiflexion of the left foot.  No weakness with plantarflexion of the foot. Speech fluent, tongue midline, no facial droop Strength intact elsewhere  Skin: Skin is warm and dry. He is not diaphoretic.  Psychiatric: He has a normal mood and affect. His behavior is normal.     Labs on Admission: I have personally reviewed following labs and imaging studies  CBC: Recent Labs  Lab 12/11/18 1153  WBC 8.4  NEUTROABS 6.9  HGB 12.3*    HCT 38.9*  MCV 86.8  PLT 144   Basic Metabolic Panel: Recent Labs  Lab 12/11/18 1153  NA 132*  K 5.1  CL 98  CO2 28  GLUCOSE 115*  BUN 27*  CREATININE 1.07  CALCIUM 9.0   GFR: Estimated Creatinine Clearance: 59.7 mL/min (by C-G formula based on SCr of 1.07 mg/dL). Liver Function Tests: Recent Labs  Lab 12/11/18 1153  AST 21  ALT 31  ALKPHOS 52  BILITOT 0.6  PROT 6.7  ALBUMIN 3.0*   No results for input(s): LIPASE, AMYLASE in the last 168 hours. No results for input(s): AMMONIA in the last 168 hours. Coagulation Profile: No results for input(s): INR, PROTIME in the last 168 hours. Cardiac Enzymes: Recent Labs  Lab 12/11/18 1127 12/11/18 1610  TROPONINI 0.04* 0.04*   BNP (last 3 results) No results for input(s): PROBNP in the last 8760 hours. HbA1C: No results for input(s): HGBA1C in the last 72 hours. CBG: No results for input(s): GLUCAP in the last 168 hours. Lipid Profile: No results for input(s): CHOL, HDL, LDLCALC, TRIG, CHOLHDL, LDLDIRECT in the last 72 hours. Thyroid Function Tests: No results for input(s): TSH, T4TOTAL, FREET4, T3FREE, THYROIDAB in the last 72 hours. Anemia Panel: No results for input(s): VITAMINB12, FOLATE, FERRITIN, TIBC, IRON, RETICCTPCT in the last 72 hours. Urine analysis:    Component Value Date/Time   COLORURINE yellow 09/06/2008 1148   APPEARANCEUR Clear 09/06/2008 1148   LABSPEC <1.005 09/06/2008 1148   PHURINE 6.0 09/06/2008 1148   HGBUR negative 09/06/2008 1148   BILIRUBINUR negative 09/06/2008 1148   UROBILINOGEN 0.2 09/06/2008 1148   NITRITE negative 09/06/2008 1148    Radiological Exams on Admission: Dg Chest 2 View  Result Date: 12/11/2018 CLINICAL DATA:  Cough and congestion. EXAM: CHEST - 2 VIEW COMPARISON:  11/17/2018 FINDINGS: There is a left chest wall pacer device with leads in the right atrial appendage and right ventricle. Normal heart size. No pleural effusion or edema. No airspace opacities  identified. Osseous structures are unremarkable. IMPRESSION: No active cardiopulmonary disease. Electronically Signed   By: Kerby Moors M.D.   On: 12/11/2018 12:01    EKG: Independently reviewed.  Paced rhythm (heart rate 106).  Assessment/Plan Principal Problem:   Acute respiratory failure with hypoxia (HCC) Active Problems:   Type 2 diabetes mellitus with vascular disease (HCC)   HLD (hyperlipidemia)   Asthma   GERD (gastroesophageal reflux disease)   Essential hypertension   Acute hypoxic respiratory failure secondary to pneumonitis SPO2 80% in the ED.  Currently satting well on room air and no increased work of breathing.  Wheezing appreciated on auscultation. BNP normal.  Influenza panel negative. Chest x-ray showing no active cardiopulmonary disease.  CT angiogram negative for PE.  Showing patchy groundglass interstitial prominence in both upper lobes and micronodular interstitial prominence in both lower lobes thought to be compatible with an infectious pneumonitis per radiologist read.  Procalcitonin not impressive (0.13).  Patient is afebrile and does not have leukocytosis to suggest an infectious process. -Received Solu-Cortef in the ED.  Continue Solu-Medrol 60 mg every 8 hours. -Levalbuterol-ipratropium every 6 hours -Continue home Dulera -Mucinex DM for cough -Supplemental oxygen if needed  Troponinemia Troponin flat 0.04 >0.04 >0.03 >0.04.  EKG with paced rhythm and difficult to interpret. Patient is not complaining of chest pain.  -Continue to trend troponin  Anemia Hemoglobin 12.3, was 14.4 five months ago. -Check iron, ferritin, TIBC  Foot drop Weakness with dorsiflexion of  the left foot.  Symptoms started 1 week after finishing a course of Levaquin.  No signs of tendinopathy.  -Nerve conduction study  Mild hyponatremia Sodium 132.  Received IV fluid. -Monitor BMP  Type 2 diabetes  -Check A1c -Sliding scale insulin sensitive -CBG  checks  Hypertension -Hold home antihypertensives in the setting of soft blood pressure readings  Hyperlipidemia -Continue Crestor  GERD -Continue PPI  Physical deconditioning -PT evaluation  DVT prophylaxis: Lovenox Code Status: Patient wishes to be full code. Family Communication: Wife at bedside. Disposition Plan: Anticipate discharge in 1 to 2 days. Consults called: None  Admission status: Observation, telemetry   Shela Leff MD [facility] Pager [phone]  If 7PM-7AM, please contact night-coverage [email or web address] Password [email or web address]  12/11/2018, 6:45 PM

## 2018-12-11 NOTE — Progress Notes (Signed)
Patient with PMH of DM; OSA; pacemaker placement; CAD; HTN; HLD; and CHF (EF 40-45%) presenting with cough.    He has had cough and congestion x 2 months with progressive fatigue despite multiple rounds of steroids/antibiotics.  He saw Dr. Valeta Harms about 2 weeks ago - given more steroids and no better.   For 2 weeks, he has noticed a foot drop - related to Levaquin?  Troponin 0.04, possible demand.  CXR negative.  Needs premedication for CT.   Needs observation for acute (subacute) hypoxic respiratory failure.  BP soft so may benefit from telemetry observation.  Carlyon Shadow, M.D.

## 2018-12-12 DIAGNOSIS — Z87891 Personal history of nicotine dependence: Secondary | ICD-10-CM | POA: Diagnosis not present

## 2018-12-12 DIAGNOSIS — Z8249 Family history of ischemic heart disease and other diseases of the circulatory system: Secondary | ICD-10-CM | POA: Diagnosis not present

## 2018-12-12 DIAGNOSIS — Z7952 Long term (current) use of systemic steroids: Secondary | ICD-10-CM | POA: Diagnosis not present

## 2018-12-12 DIAGNOSIS — B9789 Other viral agents as the cause of diseases classified elsewhere: Secondary | ICD-10-CM | POA: Diagnosis present

## 2018-12-12 DIAGNOSIS — E871 Hypo-osmolality and hyponatremia: Secondary | ICD-10-CM | POA: Diagnosis present

## 2018-12-12 DIAGNOSIS — I110 Hypertensive heart disease with heart failure: Secondary | ICD-10-CM | POA: Diagnosis present

## 2018-12-12 DIAGNOSIS — I5022 Chronic systolic (congestive) heart failure: Secondary | ICD-10-CM | POA: Diagnosis present

## 2018-12-12 DIAGNOSIS — Z79899 Other long term (current) drug therapy: Secondary | ICD-10-CM | POA: Diagnosis not present

## 2018-12-12 DIAGNOSIS — J129 Viral pneumonia, unspecified: Secondary | ICD-10-CM | POA: Diagnosis present

## 2018-12-12 DIAGNOSIS — K219 Gastro-esophageal reflux disease without esophagitis: Secondary | ICD-10-CM | POA: Diagnosis present

## 2018-12-12 DIAGNOSIS — I2510 Atherosclerotic heart disease of native coronary artery without angina pectoris: Secondary | ICD-10-CM | POA: Diagnosis present

## 2018-12-12 DIAGNOSIS — Z7951 Long term (current) use of inhaled steroids: Secondary | ICD-10-CM | POA: Diagnosis not present

## 2018-12-12 DIAGNOSIS — Z91041 Radiographic dye allergy status: Secondary | ICD-10-CM | POA: Diagnosis not present

## 2018-12-12 DIAGNOSIS — J45901 Unspecified asthma with (acute) exacerbation: Secondary | ICD-10-CM | POA: Diagnosis present

## 2018-12-12 DIAGNOSIS — E1159 Type 2 diabetes mellitus with other circulatory complications: Secondary | ICD-10-CM | POA: Diagnosis present

## 2018-12-12 DIAGNOSIS — Z950 Presence of cardiac pacemaker: Secondary | ICD-10-CM | POA: Diagnosis not present

## 2018-12-12 DIAGNOSIS — I441 Atrioventricular block, second degree: Secondary | ICD-10-CM | POA: Diagnosis present

## 2018-12-12 DIAGNOSIS — T380X5A Adverse effect of glucocorticoids and synthetic analogues, initial encounter: Secondary | ICD-10-CM | POA: Diagnosis present

## 2018-12-12 DIAGNOSIS — E785 Hyperlipidemia, unspecified: Secondary | ICD-10-CM | POA: Diagnosis present

## 2018-12-12 DIAGNOSIS — I252 Old myocardial infarction: Secondary | ICD-10-CM | POA: Diagnosis not present

## 2018-12-12 DIAGNOSIS — Z8371 Family history of colonic polyps: Secondary | ICD-10-CM | POA: Diagnosis not present

## 2018-12-12 DIAGNOSIS — Z7982 Long term (current) use of aspirin: Secondary | ICD-10-CM | POA: Diagnosis not present

## 2018-12-12 DIAGNOSIS — J9601 Acute respiratory failure with hypoxia: Secondary | ICD-10-CM | POA: Diagnosis not present

## 2018-12-12 DIAGNOSIS — M21372 Foot drop, left foot: Secondary | ICD-10-CM | POA: Diagnosis present

## 2018-12-12 LAB — TROPONIN I
Troponin I: 0.04 ng/mL (ref <0.03)
Troponin I: 0.04 ng/mL

## 2018-12-12 LAB — RESPIRATORY PANEL BY PCR

## 2018-12-12 LAB — IRON AND TIBC
Iron: 36 ug/dL — ABNORMAL LOW (ref 45–182)
Saturation Ratios: 14 % — ABNORMAL LOW (ref 17.9–39.5)
TIBC: 251 ug/dL (ref 250–450)
UIBC: 215 ug/dL

## 2018-12-12 LAB — BASIC METABOLIC PANEL WITH GFR
Anion gap: 12 (ref 5–15)
BUN: 24 mg/dL — ABNORMAL HIGH (ref 8–23)
CO2: 25 mmol/L (ref 22–32)
Calcium: 8.7 mg/dL — ABNORMAL LOW (ref 8.9–10.3)
Chloride: 101 mmol/L (ref 98–111)
Creatinine, Ser: 1.24 mg/dL (ref 0.61–1.24)
GFR calc Af Amer: >60 mL/min
GFR calc non Af Amer: 57 mL/min — ABNORMAL LOW
Glucose, Bld: 179 mg/dL — ABNORMAL HIGH (ref 70–99)
Potassium: 4 mmol/L (ref 3.5–5.1)
Sodium: 138 mmol/L (ref 135–145)

## 2018-12-12 LAB — GLUCOSE, CAPILLARY
Glucose-Capillary: 169 mg/dL — ABNORMAL HIGH (ref 70–99)
Glucose-Capillary: 180 mg/dL — ABNORMAL HIGH (ref 70–99)
Glucose-Capillary: 260 mg/dL — ABNORMAL HIGH (ref 70–99)
Glucose-Capillary: 250 mg/dL — ABNORMAL HIGH (ref 70–99)

## 2018-12-12 LAB — HEMOGLOBIN A1C
HEMOGLOBIN A1C: 8.1 % — AB (ref 4.8–5.6)
Mean Plasma Glucose: 185.77 mg/dL

## 2018-12-12 LAB — FERRITIN: Ferritin: 241 ng/mL (ref 24–336)

## 2018-12-12 MED ORDER — BUDESONIDE 0.25 MG/2ML IN SUSP
0.2500 mg | Freq: Two times a day (BID) | RESPIRATORY_TRACT | Status: DC
  Administered 2018-12-12 – 2018-12-13 (×3): 0.25 mg via RESPIRATORY_TRACT
  Filled 2018-12-12 (×3): qty 2

## 2018-12-12 MED ORDER — FREESTYLE LIBRE 14 DAY SENSOR MISC: 2.0000 | 2 refills | Status: DC

## 2018-12-12 MED ORDER — FREESTYLE LIBRE 14 DAY READER DEVI: 1.0000 | 0 refills | Status: DC

## 2018-12-12 MED ORDER — ENSURE ENLIVE PO LIQD
237.0000 mL | Freq: Two times a day (BID) | ORAL | Status: DC
  Administered 2018-12-12 – 2018-12-13 (×2): 237 mL via ORAL

## 2018-12-12 NOTE — Evaluation (Signed)
Physical Therapy Evaluation Patient Details Name: Jeffery [name] MRN: [id number] DOB: [birth year] Today's Date: 12/12/2018   History of Present Illness  77 yo male with recent idiopathic foot drop was admitted with hypoxic resp failure and noted to have PNA and bronchiectasis.  Has been SOB with ABT since Oct 2019, now admitted for tightness of chest and has leukocytosis, low O2 sats and cleared for PE and flu.  Foot drop came on 19 days ago per pt after taking Levaquin.  PMHx:  LBBB, HTN, CHF, PNA, back surgery, DM, pacemaker, asthma, MI  Clinical Impression  Pt was seen for mobility with O2 sats remaining well above 90% with all walking on room air, and right after walk with stairs was 98%.  His L foot drop is recent and significant, with many possible causes.  Recommend he be assessed by neurologist to see what could be the state of innervation to the DF's and to assess the needs for treatment.  He is motivated to have this done, but has not been able to get treatment due to recent lung infection.  Follow up with PT is not needed for now since pt is able to walk with mod I on the hallway with no LOB.    Follow Up Recommendations Other (comment)(consider neurology consult for foot drop)    Equipment Recommendations  None recommended by PT    Recommendations for Other Services Other (comment)(recommend consult after dc to assess L foot drop with neurol)     Precautions / Restrictions Precautions Precautions: Fall Precaution Comments: foot drop Required Braces or Orthoses: (has not received one yet) Restrictions Weight Bearing Restrictions: No      Mobility  Bed Mobility Overal bed mobility: Modified Independent                Transfers Overall transfer level: Modified independent                  Ambulation/Gait Ambulation/Gait assistance: Min guard Gait Distance (Feet): 250 Feet Assistive device: 1 person hand held assist(min guard for safety) Gait  Pattern/deviations: Step-through pattern;Decreased stride length;Wide base of support;Steppage(mild steppage to compensate for L ankle) Gait velocity: reduced Gait velocity interpretation: <1.31 ft/sec, indicative of household ambulator    Stairs Stairs: Yes Stairs assistance: Min Conservation officer, nature) Stair Management: One rail Right;Alternating pattern;Forwards Number of Stairs: 18 General stair comments: mild SOB at bottom of steps  Wheelchair Mobility    Modified Rankin (Stroke Patients Only)       Balance Overall balance assessment: Needs assistance Sitting-balance support: Feet supported Sitting balance-Leahy Scale: Good     Standing balance support: No upper extremity supported Standing balance-Leahy Scale: Good Standing balance comment: fair dynamic balance due to foot drop                             Pertinent Vitals/Pain Pain Assessment: No/denies pain    Home Living Family/patient expects to be discharged to:: Private residence Living Arrangements: Spouse/significant other;Children Available Help at Discharge: Family;Available 24 hours/day Type of Home: House Home Access: Stairs to enter Entrance Stairs-Rails: Right;Left;Can reach both Entrance Stairs-Number of Steps: 14 Home Layout: One level Home Equipment: None Additional Comments: had foot drop on L LE after onset of his symptoms of worsened breathing and has not been able to attend to it yet    Prior Function Level of Independence: Independent         Comments: no falls  Hand Dominance   Dominant Hand: Right    Extremity/Trunk Assessment   Upper Extremity Assessment Upper Extremity Assessment: Overall WFL for tasks assessed    Lower Extremity Assessment Lower Extremity Assessment: LLE deficits/detail LLE Deficits / Details: 1+ strength on L ankle after walking,  LLE Coordination: decreased fine motor    Cervical / Trunk Assessment Cervical / Trunk Assessment: Other  exceptions(back surgery in history)  Communication   Communication: No difficulties  Cognition Arousal/Alertness: Awake/alert Behavior During Therapy: WFL for tasks assessed/performed Overall Cognitive Status: Within Functional Limits for tasks assessed                                        General Comments General comments (skin integrity, edema, etc.): Pt sustained O2 with mobility from walk and stairs with 98% after walking and working on stairs    Exercises     Assessment/Plan    PT Assessment Patent does not need any further PT services  PT Problem List         PT Treatment Interventions      PT Goals (Current goals can be found in the Care Plan section)  Acute Rehab PT Goals Patient Stated Goal: to get home and address the foot drop PT Goal Formulation: With patient Time For Goal Achievement: 12/26/18 Potential to Achieve Goals: Good    Frequency     Barriers to discharge        Co-evaluation               AM-PAC PT "6 Clicks" Mobility  Outcome Measure Help needed turning from your back to your side while in a flat bed without using bedrails?: None Help needed moving from lying on your back to sitting on the side of a flat bed without using bedrails?: None Help needed moving to and from a bed to a chair (including a wheelchair)?: None Help needed standing up from a chair using your arms (e.g., wheelchair or bedside chair)?: None Help needed to walk in hospital room?: A Little Help needed climbing 3-5 steps with a railing? : A Little 6 Click Score: 22    End of Session   Activity Tolerance: Patient tolerated treatment well;Patient limited by fatigue;Other (comment)(SOB with stair effort but sats were controlled) Patient left: in chair;with call bell/phone within reach;with family/visitor present Nurse Communication: Mobility status PT Visit Diagnosis: Other abnormalities of gait and mobility (R26.89)    Time: 7035-0093 PT Time  Calculation (min) (ACUTE ONLY): 22 min   Charges:   PT Evaluation $PT Eval Moderate Complexity: 1 Mod         Ramond Dial 12/12/2018, 11:29 AM   Mee Hives, PT MS Acute Rehab Dept. Number: [place] [facility] [place]

## 2018-12-12 NOTE — Progress Notes (Addendum)
PROGRESS NOTE  Jeffery [name] UVO:[id number] DOB: 1941/06/16 DOA: 12/11/2018 PCP: [name], MD   LOS: 0 days   Brief Narrative / Interim history: 77 y.o. male with medical history significant of CAD, asthma, CHF, GERD, hypertension, hyperlipidemia, type 2 diabetes presenting to the hospital for evaluation of shortness of breath and cough.  Patient reports having shortness of breath and cough since the end of October.  States he has taken 3 rounds of antibiotics and 3 rounds of steroids since then.  He saw pulmonologist 2 weeks ago.  Reports having cough productive of sputum and dyspnea even at rest.  Reports having chest tightness and it feels like he cannot move air.  He has been having a runny nose.  States his ribs feel sore from coughing.  Denies having any chest pain.  Denies prior history of blood clots.  States he took Levaquin previously for 7 days and then 1 week after finishing the course he noticed mild left-sided foot drop.  He is having difficulty dorsiflexing his foot but has been walking okay.  No tendon pain.  Does not report any injury to the foot.  Subjective: -Continues to seal that his chest is "tight", has audible wheezing on interview.  He feels slightly improved compared to yesterday.  No chest pain, no abdominal pain, no nausea or vomiting. -Later seen ambulating in the hallway, appears tachypneic with audible wheezing from several feet away  Assessment & Plan: Principal Problem:   Acute respiratory failure with hypoxia ([place]) Active Problems:   Type 2 diabetes mellitus with vascular disease ([place])   HLD (hyperlipidemia)   Asthma   GERD (gastroesophageal reflux disease)   Essential hypertension   Principal Problem Acute hypoxic respiratory failure secondary to pneumonitis/asthma exacerbation -Initially hypoxic in the ED, however he is on room air this morning.  Continues to be tachypneic, with respiratory rate of 24 on my evaluation, also has audible wheezing  when entering the room -Patient reports a fever of 101 for couple of days prior to admission -CT scan in the ED showed pneumonitis bilateral upper bilateral lower lobes -Flu was negative, however respiratory virus panel this morning was positive for rhinovirus, suggesting viral pneumonia -His procalcitonin was negative, he is afebrile and does not have a white count, will not put on antibiotics at this point -Given significant wheezing, continue IV Solu-Medrol, continue duo nebs, have added Pulmicort, continues to require inpatient level of care -Patient mentions that on discharge she would not like prednisone but Medrol dose pack instead  Additional Problems Elevated troponin -Flat, not in a pattern consistent with ACS, no chest pain, likely demand  Mobitz type II second-degree heart block -Status post biventricular pacemaker with atrial lead earlier 2019 by Dr. Lovena Le.  No current issues  Foot drop -Started about a week after finishing Levaquin, but no tendinopathy or pain, highly suspicion of diabetes induced, can consider nerve conduction study as an outpatient and neurology referral  Mild hyponatremia -Sodium has normalized with fluids  Type 2 diabetes mellitus -A1c 8.1, previous values were 7.1 per patient, this is likely in the setting of steroid-induced hyperglycemia since he has been on several rounds of prednisone and antibiotics in the last couple of months. -He wants to get a better control, I have discussed extensively with him at bedside, he would like to try continuous glucose monitoring, will prescribe that to him  Hypertension -Hold home antihypertensives in the setting of soft blood pressure readings  Hyperlipidemia -Continue Crestor  GERD -Continue  PPI   Scheduled Meds: . aspirin  81 mg Oral QHS  . budesonide (PULMICORT) nebulizer solution  0.25 mg Nebulization BID  . dextromethorphan-guaiFENesin  2 tablet Oral BID  . enoxaparin (LOVENOX) injection  40 mg  Subcutaneous Q24H  . feeding supplement (ENSURE ENLIVE)  237 mL Oral BID BM  . hydrocortisone sod succinate (SOLU-CORTEF) inj  200 mg Intravenous Once  . insulin aspart  0-9 Units Subcutaneous TID WC  . ipratropium  0.5 mg Nebulization Q6H  . levalbuterol  1.25 mg Nebulization Q6H  . methylPREDNISolone (SOLU-MEDROL) injection  60 mg Intravenous Q8H  . mometasone-formoterol  2 puff Inhalation BID  . pantoprazole  40 mg Oral Daily  . [START ON 12/13/2018] rosuvastatin  5 mg Oral Q M,W,F   Continuous Infusions: PRN Meds:.acetaminophen **OR** acetaminophen, HYDROcodone-acetaminophen, loratadine  DVT prophylaxis: Lovenox Code Status: Full code Family Communication: Wife present at bedside Disposition Plan: Home when ready  Consultants:   None  Procedures:   None   Antimicrobials:  None    Objective: Vitals:   12/12/18 0757 12/12/18 0759 12/12/18 0800 12/12/18 1358  BP:   115/80   Pulse:   72   Resp:      Temp:      TempSrc:      SpO2: 91% 91% 100% 98%  Weight:      Height:        Intake/Output Summary (Last 24 hours) at 12/12/2018 1520 Last data filed at 12/12/2018 1200 Gross per 24 hour  Intake 1545.88 ml  Output -  Net 1545.88 ml   Filed Weights   12/11/18 1405 12/11/18 1838  Weight: 82.1 kg 81 kg    Examination:  Constitutional: No apparent distress, audible wheezing when I entered the room Eyes: PERRL, lids and conjunctivae normal ENMT: Mucous membranes are moist. No oropharyngeal exudates Neck: normal, supple, no masses, no thyromegaly Respiratory: Diffuse bilateral wheezing throughout upper and lower lung fields, rhonchi present, no crackles.  Moves air well.  Increased respiratory effort and is tachypneic on exam Cardiovascular: Regular rate and rhythm, soft SEM present. No LE edema. 2+ pedal pulses. No carotid bruits.  Abdomen: no tenderness. Bowel sounds positive.  Musculoskeletal: no clubbing / cyanosis.  Skin: no rashes Neurologic: CN 2-12  grossly intact. Strength 5/5 in all 4.  Psychiatric: Normal judgment and insight. Alert and oriented x 3. Normal mood.    Data Reviewed: I have independently reviewed following labs and imaging studies   CBC: Recent Labs  Lab 12/11/18 1153  WBC 8.4  NEUTROABS 6.9  HGB 12.3*  HCT 38.9*  MCV 86.8  PLT 814   Basic Metabolic Panel: Recent Labs  Lab 12/11/18 1153 12/12/18 0315  NA 132* 138  K 5.1 4.0  CL 98 101  CO2 28 25  GLUCOSE 115* 179*  BUN 27* 24*  CREATININE 1.07 1.24  CALCIUM 9.0 8.7*   GFR: Estimated Creatinine Clearance: 51.5 mL/min (by C-G formula based on SCr of 1.24 mg/dL). Liver Function Tests: Recent Labs  Lab 12/11/18 1153  AST 21  ALT 31  ALKPHOS 52  BILITOT 0.6  PROT 6.7  ALBUMIN 3.0*   No results for input(s): LIPASE, AMYLASE in the last 168 hours. No results for input(s): AMMONIA in the last 168 hours. Coagulation Profile: No results for input(s): INR, PROTIME in the last 168 hours. Cardiac Enzymes: Recent Labs  Lab 12/11/18 1127 12/11/18 1610 12/11/18 2051 12/12/18 0315 12/12/18 0923  TROPONINI 0.04* 0.04* 0.03* 0.04* 0.04*   BNP (last  3 results) No results for input(s): PROBNP in the last 8760 hours. HbA1C: Recent Labs    12/12/18 0315  HGBA1C 8.1*   CBG: Recent Labs  Lab 12/12/18 0316 12/12/18 1133  GLUCAP 169* 180*   Lipid Profile: No results for input(s): CHOL, HDL, LDLCALC, TRIG, CHOLHDL, LDLDIRECT in the last 72 hours. Thyroid Function Tests: No results for input(s): TSH, T4TOTAL, FREET4, T3FREE, THYROIDAB in the last 72 hours. Anemia Panel: Recent Labs    12/12/18 0315  FERRITIN 241  TIBC 251  IRON 36*   Urine analysis:    Component Value Date/Time   COLORURINE yellow 09/06/2008 1148   APPEARANCEUR Clear 09/06/2008 1148   LABSPEC <1.005 09/06/2008 1148   PHURINE 6.0 09/06/2008 1148   HGBUR negative 09/06/2008 1148   BILIRUBINUR negative 09/06/2008 1148   UROBILINOGEN 0.2 09/06/2008 1148   NITRITE  negative 09/06/2008 1148   Sepsis Labs: Invalid input(s): PROCALCITONIN, LACTICIDVEN  Recent Results (from the past 240 hour(s))  Respiratory Panel by PCR     Status: Abnormal   Collection Time: 12/12/18  8:06 AM  Result Value Ref Range Status   Adenovirus NOT DETECTED NOT DETECTED Final   Coronavirus 229E NOT DETECTED NOT DETECTED Final   Coronavirus HKU1 NOT DETECTED NOT DETECTED Final   Coronavirus NL63 NOT DETECTED NOT DETECTED Final   Coronavirus OC43 NOT DETECTED NOT DETECTED Final   Metapneumovirus NOT DETECTED NOT DETECTED Final   Rhinovirus / Enterovirus DETECTED (A) NOT DETECTED Final   Influenza A NOT DETECTED NOT DETECTED Final   Influenza B NOT DETECTED NOT DETECTED Final   Parainfluenza Virus 1 NOT DETECTED NOT DETECTED Final   Parainfluenza Virus 2 NOT DETECTED NOT DETECTED Final   Parainfluenza Virus 3 NOT DETECTED NOT DETECTED Final   Parainfluenza Virus 4 NOT DETECTED NOT DETECTED Final   Respiratory Syncytial Virus NOT DETECTED NOT DETECTED Final   Bordetella pertussis NOT DETECTED NOT DETECTED Final   Chlamydophila pneumoniae NOT DETECTED NOT DETECTED Final   Mycoplasma pneumoniae NOT DETECTED NOT DETECTED Final    Comment: Performed at [facility], [address], [place],  [address]      Radiology Studies: Dg Chest 2 View  Result Date: 12/11/2018 CLINICAL DATA:  Cough and congestion. EXAM: CHEST - 2 VIEW COMPARISON:  11/17/2018 FINDINGS: There is a left chest wall pacer device with leads in the right atrial appendage and right ventricle. Normal heart size. No pleural effusion or edema. No airspace opacities identified. Osseous structures are unremarkable. IMPRESSION: No active cardiopulmonary disease. Electronically Signed   By: Kerby Moors M.D.   On: 12/11/2018 12:01   Ct Angio Chest Pe W Or Wo Contrast  Result Date: 12/11/2018 CLINICAL DATA:  Cough, chest congestion, generalized weakness, mildly elevated troponin. EXAM: CT ANGIOGRAPHY  CHEST WITH CONTRAST TECHNIQUE: Multidetector CT imaging of the chest was performed using the standard protocol during bolus administration of intravenous contrast. Multiplanar CT image reconstructions and MIPs were obtained to evaluate the vascular anatomy. CONTRAST:  41mL ISOVUE-370 IOPAMIDOL (ISOVUE-370) INJECTION 76% COMPARISON:  Chest radiographs obtained earlier today. FINDINGS: Cardiovascular: Epicardial and transvenous pacemaker leads. Normal sized heart. Normally opacified pulmonary arteries with no pulmonary arterial filling defects seen. Mediastinum/Nodes: No enlarged mediastinal, hilar, or axillary lymph nodes. Thyroid gland, trachea, and esophagus demonstrate no significant findings. Lungs/Pleura: Areas of patchy ground-glass interstitial prominence in both upper lobes. Micro nodular interstitial prominence in both lower lobes. Mild cylindrical bronchiectasis in the right lower lobe with associated adjacent parenchymal opacities. Diffuse  peribronchial thickening. No pleural fluid. Upper Abdomen: Unremarkable. Musculoskeletal: Thoracic and lower cervical spine degenerative changes. Review of the MIP images confirms the above findings. IMPRESSION: 1. Patchy ground-glass interstitial prominence in both upper lobes and micronodular interstitial prominence in both lower lobes, most compatible with an infectious pneumonitis. 2. No pulmonary emboli. 3. Mild cylindrical bronchiectasis in the right lower lobe with associated adjacent parenchymal scarring/inflammation. 4. Moderate bronchitic changes. Electronically Signed   By: Claudie Revering M.D.   On: 12/11/2018 21:11    Marzetta Board, MD, PhD [facility] Pager [phone]  If 7PM-7AM, please contact night-coverage [email or web address] Password [email or web address] 12/12/2018, 3:20 PM

## 2018-12-12 NOTE — Progress Notes (Signed)
Initial Nutrition Assessment  DOCUMENTATION CODES:   Not applicable  INTERVENTION:    Ensure Enlive po BID, each supplement provides 350 kcal and 20 grams of protein  NUTRITION DIAGNOSIS:   Increased nutrient needs related to chronic illness as evidenced by estimated needs  GOAL:   Patient will meet greater than or equal to 90% of their needs  MONITOR:   PO intake, Supplement acceptance, Labs, Skin, Weight trends  REASON FOR ASSESSMENT:   Malnutrition Screening Tool  ASSESSMENT:   77 y.o. male with PMH significant of CAD, asthma, CHF, GERD, hypertension, hyperlipidemia, type 2 diabetes presenting to the hospital for evaluation of shortness of breath and cough.  RD spoke with pt at bedside. Daughter present. Pt reports he had a decreased appetite for 8 weeks PTA. He was consuming 3 meals per day, however, portions were smaller.  Pt also reveals he's lost about 19 lbs (196 lb to 177 lb). Weight loss calculates to 9% which is significant for time frame. He is amenable to chocolate Ensure Enlive supplements.  Labs & medications reviewed. CBG's 169-180.  NUTRITION - FOCUSED PHYSICAL EXAM:  Unable to complete at this time.  Diet Order:   Diet Order            Diet Carb Modified Fluid consistency: Thin; Room service appropriate? Yes  Diet effective now             EDUCATION NEEDS:   No education needs have been identified at this time  Skin:  Skin Assessment: Reviewed RN Assessment  Last BM:  PTA  Height:   Ht Readings from Last 1 Encounters:  12/11/18 5\' 9"  (1.753 m)   Weight:   Wt Readings from Last 1 Encounters:  12/11/18 81 kg   BMI:  Body mass index is 26.37 kg/m.  Estimated Nutritional Needs:   Kcal:  2000-2200  Protein:  100-115 gm  Fluid:  per MD  Arthur Holms, RD, LDN Pager #: [phone] After-Hours Pager #: [phone]

## 2018-12-13 LAB — GLUCOSE, CAPILLARY: GLUCOSE-CAPILLARY: 217 mg/dL — AB (ref 70–99)

## 2018-12-13 MED ORDER — LEVALBUTEROL HCL 1.25 MG/3ML IN NEBU: 1.2500 mg | INHALATION_SOLUTION | Freq: Four times a day (QID) | RESPIRATORY_TRACT | 0 refills | Status: DC

## 2018-12-13 MED ORDER — PREDNISONE 10 MG PO TABS: 10.0000 mg | ORAL_TABLET | Freq: Every day | ORAL | 0 refills | Status: DC

## 2018-12-13 MED ORDER — FAMOTIDINE 20 MG PO TABS: 20.0000 mg | ORAL_TABLET | Freq: Every day | ORAL | 0 refills | Status: DC

## 2018-12-13 MED ORDER — IPRATROPIUM BROMIDE 0.02 % IN SOLN: 0.5000 mg | Freq: Four times a day (QID) | RESPIRATORY_TRACT | 0 refills | Status: DC

## 2018-12-13 MED ORDER — ENSURE ENLIVE PO LIQD: 237.0000 mL | Freq: Two times a day (BID) | ORAL | 12 refills | Status: DC

## 2018-12-13 MED ORDER — NEBIVOLOL HCL 5 MG PO TABS: 5.0000 mg | ORAL_TABLET | Freq: Every day | ORAL | Status: DC

## 2018-12-13 MED ORDER — BENZONATATE 100 MG PO CAPS: 100.0000 mg | ORAL_CAPSULE | Freq: Three times a day (TID) | ORAL | 0 refills | Status: DC | PRN

## 2018-12-13 MED ORDER — PREDNISONE 10 MG PO TABS: ORAL_TABLET | ORAL | 0 refills | Status: DC

## 2018-12-13 NOTE — Progress Notes (Signed)
SATURATION QUALIFICATIONS: (This note is used to comply with regulatory documentation for home oxygen)  Patient Saturations on Room Air at Rest = 95%  Patient Saturations on Room Air while Ambulating = 95%   

## 2018-12-14 ENCOUNTER — Encounter: Payer: Self-pay | Admitting: Internal Medicine

## 2018-12-14 ENCOUNTER — Telehealth: Payer: Self-pay

## 2018-12-14 NOTE — Telephone Encounter (Signed)
Transition Care Management Follow-up Telephone Call   Date discharged? 12/13/2018   How have you been since you were released from the hospital? Pt is feeling better but still feels bad. Pt is concerned about the left foot drop and muscle atrophy   Do you understand why you were in the hospital? Yes   Do you understand the discharge instructions? Yes   Where were you discharged to? Home   Items Reviewed:  Medications reviewed: Yes  Allergies reviewed: Yes  Dietary changes reviewed: n/a  Referrals reviewed: n/a   Functional Questionnaire:   Activities of Daily Living (ADLs):   States they are independent in the following: All ADL's States they require assistance with the following:    Any transportation issues/concerns?: Not at this time   Any patient concerns? Not at this time   Confirmed importance and date/time of follow-up visits scheduled Yes  Provider Appointment booked with PCP on 12/19/18 at 1:00pm.  Confirmed with patient if condition begins to worsen call PCP or go to the ER.  Patient was given the office number and encouraged to call back with question or concerns: Yes.

## 2018-12-14 NOTE — Discharge Summary (Signed)
[facility] Discharge Summary   Patient: Jeffery [name]:[id number]   PCP: Jeffery Rail, MD DOB: September 05, 1941   Date of admission: 12/11/2018   Date of discharge: 12/13/2018    Discharge Diagnoses:  Principal Problem:   Acute respiratory failure with hypoxia ([place]) Active Problems:   Type 2 diabetes mellitus with vascular disease ([place])   HLD (hyperlipidemia)   Asthma   GERD (gastroesophageal reflux disease)   Essential hypertension   Viral pneumonia   Admitted From: home Disposition:  home  Recommendations for Outpatient Follow-up:  1. Please follow up with PCP in 1 week   Follow-up Information    Jeffery [name], Jeffery Lick, MD. Schedule an appointment as soon as possible for a visit in 2 week(s).   Specialty:  Internal Medicine Contact information: [place] [address] [phone]          Diet recommendation: cardiac diet  Activity: The patient is advised to gradually reintroduce usual activities.  Discharge Condition: good  Code Status: full code  History of present illness: As per the H and P dictated on admission, "Jeffery [name] is a 78 y.o. male with medical history significant of CAD, asthma, CHF, GERD, hypertension, hyperlipidemia, type 2 diabetes presenting to the hospital for evaluation of shortness of breath and cough.  Patient reports having shortness of breath and cough since the end of October.  States he has taken 3 rounds of antibiotics and 3 rounds of steroids since then.  He saw pulmonologist 2 weeks ago.  Reports having cough productive of sputum and dyspnea even at rest.  Reports having chest tightness and it feels like he cannot move air.  He has been having a runny nose.  States his ribs feel sore from coughing.  Denies having any chest pain.  Denies prior history of blood clots.  States he took Levaquin previously for 7 days and then 1 week after finishing the course he noticed mild left-sided foot drop.  He is having difficulty  dorsiflexing his foot but has been walking okay.  No tendon pain.  Does not report any injury to the foot.  ED Course: Tachycardic initially, now improved.  Blood pressure soft with systolic in 77O to 24M, improved with 1.5 L IV fluid bolus. SPO2 80% on room air.  No leukocytosis.  BNP normal.  Influenza panel negative.  Troponin 0.04 >0.04.  EKG with paced rhythm and difficult to interpret.  Chest x-ray showing no active cardiopulmonary disease.  Patient has contrast dye allergy and received Benadryl and Solu-Cortef in the ED.  Patient was transferred to [facility] and CT angiogram pending."  Hospital Course:  Summary of his active problems in the hospital is as following. Acute hypoxic respiratory failure secondary to pneumonitis Asthma exacerbation -Initially hypoxic in the ED, however he is on room air this morning.  -Patient reports a fever of 101 for couple of days prior to admission -CT scan in the ED showed pneumonitis bilateral upper bilateral lower lobes -Flu was negative, however respiratory virus panel was positive for rhinovirus, suggesting viral pneumonia -His procalcitonin was negative, he is afebrile and does not have a white count, will not put on antibiotics at this point -Given significant wheezing, continue steroids, continue duo nebs  Elevated troponin -Flat, not in a pattern consistent with ACS, no chest pain, likely demand  Mobitz type II second-degree heart block -Status post biventricular pacemaker with atrial lead earlier 2019 by Dr. Lovena Le.  No current issues  Foot drop -  Started about a week after finishing Levaquin, but no tendinopathy or pain, highly suspicion of diabetes induced, can consider nerve conduction study as an outpatient and neurology referral - able to walk without any complication for now  Mild hyponatremia -Sodium has normalized with fluids  Type 2 diabetes mellitus -A1c 8.1, previous values were 7.1 per patient, this is likely in the  setting of steroid-induced hyperglycemia since he has been on several rounds of prednisone and antibiotics in the last couple of months. -He wants to get a better control, he would like to try continuous glucose monitoring, prescribe that to him  Hypertension -Hold home antihypertensives in the setting of soft blood pressure readings  Hyperlipidemia -Continue Crestor  GERD -Continue PPI  All other chronic medical condition were stable during the hospitalization.  Patient was seen by physical therapy, who recommended no PT follow up needed  On the day of the discharge the patient's vitals were stable , and no other acute medical condition were reported by patient. the patient was felt safe to be discharge at home with family.  Consultants: none Procedures: none  DISCHARGE MEDICATION: Allergies as of 12/13/2018      Reactions   Contrast Media [iodinated Diagnostic Agents] Other (See Comments)   Redness and warm sensation, this reaction was noted on 02/27/13 during a Cardiac MRI per pt.  Pt sts he had erythema on his head, chest, and shoulders.  Pt had a pacemaker placed August 2019 and was pre-medicated for the dye and had no allergic reaction at that time. -Carissa Mozingo B.S. RT(R)(CT)      Medication List    STOP taking these medications   glipiZIDE 5 MG tablet Commonly known as:  GLUCOTROL   lisinopril-hydrochlorothiazide 20-12.5 MG tablet Commonly known as:  PRINZIDE,ZESTORETIC   NEXIUM 24HR 20 MG capsule Generic drug:  esomeprazole     TAKE these medications   ACCU-CHEK AVIVA PLUS w/Device Kit Use as directed   ACCU-CHEK AVIVA Soln Use as instructed to test blood sugar ICD 10 E11 51   ACCU-CHEK SOFTCLIX LANCETS lancets Use to test blood sugar daily as directed.   Lancets Misc Use to check blood sugars 6 times daily. DX Code: E11.59   aspirin 81 MG tablet Take 81 mg by mouth at bedtime.   AUTOLET IMPRESSION Misc Use as instructed to test blood sugar ICD 10  E11 51   benzonatate 100 MG capsule Commonly known as:  TESSALON PERLES Take 1 capsule (100 mg total) by mouth 3 (three) times daily as needed for cough.   famotidine 20 MG tablet Commonly known as:  PEPCID Take 1 tablet (20 mg total) by mouth daily.   feeding supplement (ENSURE ENLIVE) Liqd Take 237 mLs by mouth 2 (two) times daily between meals.   fexofenadine 180 MG tablet Commonly known as:  ALLEGRA Take 180 mg by mouth daily as needed for allergies.   FREESTYLE LIBRE 14 DAY READER Devi 1 Device by Does not apply route every 14 (fourteen) days.   FREESTYLE LIBRE 14 DAY SENSOR Misc 2 each by Does not apply route every 14 (fourteen) days.   glucose blood test strip Commonly known as:  ACCU-CHEK AVIVA PLUS Use as instructed to test blood sugar 6 times daily. DX Code: E11.59   HYDROcodone-acetaminophen 7.5-325 MG tablet Commonly known as:  NORCO Take 1 tablet by mouth every 6 (six) hours as needed (for chronic joint pain).   ibuprofen 800 MG tablet Commonly known as:  ADVIL,MOTRIN Take 800 mg by  mouth every 8 (eight) hours as needed for headache or moderate pain.   ipratropium 0.02 % nebulizer solution Commonly known as:  ATROVENT Take 2.5 mLs (0.5 mg total) by nebulization every 6 (six) hours.   levalbuterol 1.25 MG/3ML nebulizer solution Commonly known as:  XOPENEX Take 1.25 mg by nebulization every 6 (six) hours. What changed:    when to take this  reasons to take this   LORazepam 1 MG tablet Commonly known as:  ATIVAN TAKE 1/2 TO 1 TABLET BY MOUTH DAILY AS NEEDED What changed:    how much to take  how to take this  when to take this  reasons to take this  additional instructions   metFORMIN 500 MG tablet Commonly known as:  GLUCOPHAGE TAKE 1 TABLET BY MOUTH  DAILY WITH BREAKFAST.   montelukast 10 MG tablet Commonly known as:  SINGULAIR TAKE 1 TABLET BY MOUTH  DAILY What changed:  when to take this   multivitamin with minerals Tabs  tablet Take 1 tablet by mouth daily.   nebivolol 5 MG tablet Commonly known as:  BYSTOLIC Take 1 tablet (5 mg total) by mouth daily. Start taking on:  December 16, 2018 What changed:    how much to take  These instructions start on December 16, 2018. If you are unsure what to do until then, ask your doctor or other care provider.   nitroGLYCERIN 0.4 MG SL tablet Commonly known as:  NITROSTAT Place 0.4 mg under the tongue every 5 (five) minutes x 3 doses as needed for chest pain.   predniSONE 10 MG tablet Commonly known as:  DELTASONE Take 1 tablet (10 mg total) by mouth daily. What changed:  You were already taking a medication with the same name, and this prescription was added. Make sure you understand how and when to take each.   predniSONE 10 MG tablet Commonly known as:  DELTASONE Take 41m daily for 3days,Take 319mdaily for 3days,Take 2065maily for 3days,Take 60m82mily for 3days, then stop What changed:  additional instructions   PROAIR HFA 108 (90 Base) MCG/ACT inhaler Generic drug:  albuterol Inhale 2 puffs into the lungs every 6 (six) hours as needed for wheezing or shortness of breath.   albuterol (2.5 MG/3ML) 0.083% nebulizer solution Commonly known as:  PROVENTIL Take 3 mLs (2.5 mg total) by nebulization every 6 (six) hours as needed for wheezing or shortness of breath.   rosuvastatin 5 MG tablet Commonly known as:  CRESTOR TAKE 1 TABLET BY MOUTH  EVERY MONDAY, WEDNESDAY,  AND FRIDAY What changed:  See the new instructions.   SYMBICORT 160-4.5 MCG/ACT inhaler Generic drug:  budesonide-formoterol INHALE 1 TO 2 PUFFS INTO  THE LUNGS EVERY 12 HOURS AS NEEDED What changed:  See the new instructions.   vardenafil 20 MG tablet Commonly known as:  LEVITRA TAKE 1 TABLET BY MOUTH AS DIRECTED. CAN NOT BE TAKEN WITH NITROGLYCERIN. What changed:    how much to take  how to take this  when to take this  reasons to take this  additional instructions       Allergies  Allergen Reactions  . Contrast Media [Iodinated Diagnostic Agents] Other (See Comments)    Redness and warm sensation, this reaction was noted on 02/27/13 during a Cardiac MRI per pt.  Pt sts he had erythema on his head, chest, and shoulders.  Pt had a pacemaker placed August 2019 and was pre-medicated for the dye and had no allergic reaction at that time. -  Carissa Mozingo B.S. RT(R)(CT)   Discharge Instructions    Diet - low sodium heart healthy   Complete by:  As directed    Discharge instructions   Complete by:  As directed    It is important that you read following instructions as well as go over your medication list with RN to help you understand your care after this hospitalization.  Discharge Instructions: Please follow-up with PCP in one week  Please request your primary care physician to go over all Hospital Tests and Procedure/Radiological results at the follow up,  Please get all Hospital records sent to your PCP by signing hospital release before you go home.   Do not take more than prescribed Pain, Sleep and Anxiety Medications. You were cared for by a hospitalist during your hospital stay. If you have any questions about your discharge medications or the care you received while you were in the hospital after you are discharged, you can call the unit you were admitted to and ask to speak with the hospitalist on call if the hospitalist that took care of you is not available.  Once you are discharged, your primary care physician will handle any further medical issues. Please note that NO REFILLS for any discharge medications will be authorized once you are discharged, as it is imperative that you return to your primary care physician (or establish a relationship with a primary care physician if you do not have one) for your aftercare needs so that they can reassess your need for medications and monitor your lab values. You Must read complete instructions/literature  along with all the possible adverse reactions/side effects for all the Medicines you take and that have been prescribed to you. Take any new Medicines after you have completely understood and accept all the possible adverse reactions/side effects. Wear Seat belts while driving. If you have smoked or chewed Tobacco in the last 2 yrs please stop smoking and/or stop any Recreational drug use.   Increase activity slowly   Complete by:  As directed      Discharge Exam: Filed Weights   12/11/18 1405 12/11/18 1838  Weight: 82.1 kg 81 kg   Vitals:   12/13/18 0722 12/13/18 0747  BP:  124/66  Pulse: 77 72  Resp: 16   Temp:  (!) 97.5 F (36.4 C)  SpO2: 94% 97%   General: Appear in no distress, no Rash; Oral Mucosa moist. Cardiovascular: S1 and S2 Present, no Murmur, no JVD Respiratory: Bilateral Air entry present and Clear to Auscultation, no Crackles, no wheezes Abdomen: Bowel Sound present, Soft and no tenderness Extremities: no Pedal edema, no calf tenderness Neurology: Grossly no focal neuro deficit.  The results of significant diagnostics from this hospitalization (including imaging, microbiology, ancillary and laboratory) are listed below for reference.    Significant Diagnostic Studies: Dg Chest 2 View  Result Date: 12/11/2018 CLINICAL DATA:  Cough and congestion. EXAM: CHEST - 2 VIEW COMPARISON:  11/17/2018 FINDINGS: There is a left chest wall pacer device with leads in the right atrial appendage and right ventricle. Normal heart size. No pleural effusion or edema. No airspace opacities identified. Osseous structures are unremarkable. IMPRESSION: No active cardiopulmonary disease. Electronically Signed   By: Kerby Moors M.D.   On: 12/11/2018 12:01   Dg Chest 2 View  Result Date: 11/17/2018 CLINICAL DATA:  Five week history of cough, chest congestion, shortness of breath, and fever. Little relief with antibiotics and prednisone. History of asthma, coronary artery disease and CHF,  former smoker. EXAM: CHEST - 2 VIEW COMPARISON:  PA and lateral chest x-ray of July 26, 2018 FINDINGS: The lungs are well-expanded. There are coarse lung markings in the retrocardiac region likely on the left medially. There is no pleural effusion. The heart and pulmonary vascularity are normal. The ICD is in stable position. There is calcification of portions of the anterior longitudinal ligament of the thoracic spine with mild degenerative disc space narrowing at multiple levels. IMPRESSION: COPD. Subsegmental atelectasis or developing pneumonia in the left lower lobe posteriorly. Followup PA and lateral chest X-ray is recommended in 3-4 weeks following trial of antibiotic therapy to ensure resolution and exclude underlying malignancy. Electronically Signed   By: David  Martinique M.D.   On: 11/17/2018 12:23   Ct Angio Chest Pe W Or Wo Contrast  Result Date: 12/11/2018 CLINICAL DATA:  Cough, chest congestion, generalized weakness, mildly elevated troponin. EXAM: CT ANGIOGRAPHY CHEST WITH CONTRAST TECHNIQUE: Multidetector CT imaging of the chest was performed using the standard protocol during bolus administration of intravenous contrast. Multiplanar CT image reconstructions and MIPs were obtained to evaluate the vascular anatomy. CONTRAST:  64m ISOVUE-370 IOPAMIDOL (ISOVUE-370) INJECTION 76% COMPARISON:  Chest radiographs obtained earlier today. FINDINGS: Cardiovascular: Epicardial and transvenous pacemaker leads. Normal sized heart. Normally opacified pulmonary arteries with no pulmonary arterial filling defects seen. Mediastinum/Nodes: No enlarged mediastinal, hilar, or axillary lymph nodes. Thyroid gland, trachea, and esophagus demonstrate no significant findings. Lungs/Pleura: Areas of patchy ground-glass interstitial prominence in both upper lobes. Micro nodular interstitial prominence in both lower lobes. Mild cylindrical bronchiectasis in the right lower lobe with associated adjacent parenchymal  opacities. Diffuse peribronchial thickening. No pleural fluid. Upper Abdomen: Unremarkable. Musculoskeletal: Thoracic and lower cervical spine degenerative changes. Review of the MIP images confirms the above findings. IMPRESSION: 1. Patchy ground-glass interstitial prominence in both upper lobes and micronodular interstitial prominence in both lower lobes, most compatible with an infectious pneumonitis. 2. No pulmonary emboli. 3. Mild cylindrical bronchiectasis in the right lower lobe with associated adjacent parenchymal scarring/inflammation. 4. Moderate bronchitic changes. Electronically Signed   By: SClaudie ReveringM.D.   On: 12/11/2018 21:11    Microbiology: Recent Results (from the past 240 hour(s))  Respiratory Panel by PCR     Status: Abnormal   Collection Time: 12/12/18  8:06 AM  Result Value Ref Range Status   Adenovirus NOT DETECTED NOT DETECTED Final   Coronavirus 229E NOT DETECTED NOT DETECTED Final   Coronavirus HKU1 NOT DETECTED NOT DETECTED Final   Coronavirus NL63 NOT DETECTED NOT DETECTED Final   Coronavirus OC43 NOT DETECTED NOT DETECTED Final   Metapneumovirus NOT DETECTED NOT DETECTED Final   Rhinovirus / Enterovirus DETECTED (A) NOT DETECTED Final   Influenza A NOT DETECTED NOT DETECTED Final   Influenza B NOT DETECTED NOT DETECTED Final   Parainfluenza Virus 1 NOT DETECTED NOT DETECTED Final   Parainfluenza Virus 2 NOT DETECTED NOT DETECTED Final   Parainfluenza Virus 3 NOT DETECTED NOT DETECTED Final   Parainfluenza Virus 4 NOT DETECTED NOT DETECTED Final   Respiratory Syncytial Virus NOT DETECTED NOT DETECTED Final   Bordetella pertussis NOT DETECTED NOT DETECTED Final   Chlamydophila pneumoniae NOT DETECTED NOT DETECTED Final   Mycoplasma pneumoniae NOT DETECTED NOT DETECTED Final    Comment: [facility], [address], [place] [address]    Labs: CBC: Recent Labs  Lab 12/11/18 1153  WBC 8.4  NEUTROABS 6.9  HGB 12.3*  HCT 38.9*  MCV 86.8  PLT 202   Basic Metabolic Panel: Recent Labs  Lab 12/11/18 1153 12/12/18 0315  NA 132* 138  K 5.1 4.0  CL 98 101  CO2 28 25  GLUCOSE 115* 179*  BUN 27* 24*  CREATININE 1.07 1.24  CALCIUM 9.0 8.7*   Liver Function Tests: Recent Labs  Lab 12/11/18 1153  AST 21  ALT 31  ALKPHOS 52  BILITOT 0.6  PROT 6.7  ALBUMIN 3.0*   No results for input(s): LIPASE, AMYLASE in the last 168 hours. No results for input(s): AMMONIA in the last 168 hours. Cardiac Enzymes: Recent Labs  Lab 12/11/18 1127 12/11/18 1610 12/11/18 2051 12/12/18 0315 12/12/18 0923  TROPONINI 0.04* 0.04* 0.03* 0.04* 0.04*   BNP (last 3 results) Recent Labs    12/11/18 1127  BNP 79.8   CBG: Recent Labs  Lab 12/12/18 0316 12/12/18 1133 12/12/18 1702 12/12/18 2112 12/13/18 0746  GLUCAP 169* 180* 260* 250* 217*   Time spent: 35 minutes  Signed:  Berle Mull  [facility] 12/13/2018 , 3:38 PM

## 2018-12-15 NOTE — Consult Note (Signed)
            [facility] CM Primary Care Navigator  12/15/2018  Dianna Deshler [facility] 07/21/1941 [id number]   Attempt to seepatient at the bedside to identify possible discharge needs buthe wasdischarged home.  Per MD note, patient presented to the hospital for evaluation of persistent shortness of breathand cough with noted mild left-sided foot drop. (acute hypoxic respiratory failure with hypoxia secondary to pneumonitis, asthma exacerbation, viral pneumonia, DM II with A1c of 8.1- this is likely in the setting of steroid-induced hyperglycemia since he had been on several rounds of prednisone and antibiotics in the last couple of months)  Patient has discharge instruction to follow-up withprimary care provider in 1- 2 weeks.  Primary care provider's office is listed as providing transition of care (TOC) follow-up.   For additional questions please contact:  Edwena Felty A. Wendolyn Raso, BSN, RN-BC [facility] PRIMARY CARE Navigator Cell: [phone]

## 2018-12-17 NOTE — Progress Notes (Signed)
Cardiology Office Note:    Date:  12/18/2018   ID:  Jeffery [name], DOB 1941/06/02, MRN [id number]  PCP:  [name], MD  Cardiologist:  No primary care provider on file.   Referring MD: [name], MD   Chief Complaint  Patient presents with  . Coronary Artery Disease  . Congestive Heart Failure  . Shortness of Breath    History of Present Illness:    Jeffery [name] is a 78 y.o. male with a hx of who is being seen to follow-up left bundle branch block, hypertension, hyperlipidemia, high grade AV block, obstructive sleep apnea, chronic combined systolic and diastolic HF, diabetes mellitus, obesity and recent hospital stay for dyspnea and hypoxia.  After a 2 to 31-monthhistory of recurring upper respiratory congestion, cough, and phlegm production, Steffen required hospitalization for hypoxic respiratory failure.  Antibiotic therapy and intensive bronchodilator therapy was started.  ACE inhibitor therapy was discontinued and was felt to be a contributory factor to airway instability.  He was recently discharged from the hospital.  He is doing better.  He is concerned about weight that he has lost over the last 3 months.  Appetite is poor because food has no taste.  He denies angina, orthopnea, PND, nitroglycerin use, peripheral edema, palpitations, and syncope.  Since permanent pacemaker insertion for high-grade AV block, exertional tolerance dramatically improved.  He has had no tachypalpitations.   Past Medical History:  Diagnosis Date  . Arthritis    "thumbs" (07/25/2018)  . Asthma   . CHF (congestive heart failure) ([facility]   . Chronic bronchitis ([facility]   . Environmental allergies   . GERD (gastroesophageal reflux disease)   . Headache    "seasonal; w/environmental allergies" (07/25/2018)  . History of blood transfusion    "when I had laminectomy" (07/25/2018)  . HTN (hypertension)   . Hyperlipidemia   . LBBB (left bundle branch block) 1999  . Myocardial infarction  ([facility]    "was told I've had an old MI; probably in the 115s (07/25/2018)  . Pneumonia    "as a child, age 78 viral pneumonia 3 times in the last 10 years" (07/25/2018)  . Presence of permanent cardiac pacemaker 07/25/2018  . Seasonal allergies   . Sleep apnea    "wife says I do" (07/25/2018)  . Type II diabetes mellitus ([facility]     Past Surgical History:  Procedure Laterality Date  . BACK SURGERY    . BIV PACEMAKER INSERTION CRT-P  07/25/2018  . BIV PACEMAKER INSERTION CRT-P N/A 07/25/2018   Procedure: BIV PACEMAKER INSERTION CRT-P;  Surgeon: TEvans Lance MD;  [facility];  Service: Cardiovascular;  Laterality: N/A;  . CARDIAC CATHETERIZATION  2003   Dr HPernell Dupre 85 % R circumflex obstruction  . CARPAL TUNNEL RELEASE Left 10/11/2014   Procedure: LEFT CARPAL TUNNEL RELEASE;  Surgeon: WRoseanne Kaufman MD;  [place] [facility]  Service: Orthopedics;  Laterality: Left;  . COLONOSCOPY W/ BIOPSIES AND POLYPECTOMY  2018  . INGUINAL HERNIA REPAIR Right 1948  . INGUINAL HERNIA REPAIR Left 1988  . LUMBAR LAMINECTOMY  1984  . MINOR CARPAL TUNNEL Right 11/22/2014   Procedure: RIGHT LIMITED OPEN CARPAL TUNNEL RELEASE;  Surgeon: WRoseanne Kaufman MD;  Location: [facility]  Service: Orthopedics;  Laterality: Right;  . TONSILLECTOMY  1958    Current Medications: Current Meds  Medication Sig  . ACCU-CHEK SOFTCLIX LANCETS lancets Use to test blood sugar daily as directed.  .[name]  albuterol (PROAIR HFA) 108 (90 BASE) MCG/ACT inhaler Inhale 2 puffs into the lungs every 6 (six) hours as needed for wheezing or shortness of breath.   [name] albuterol (PROVENTIL) (2.5 MG/3ML) 0.083% nebulizer solution Take 3 mLs (2.5 mg total) by nebulization every 6 (six) hours as needed for wheezing or shortness of breath.  [name] aspirin 81 MG tablet Take 81 mg by mouth at bedtime.    . benzonatate (TESSALON PERLES) 100 MG capsule Take 1 capsule (100 mg total) by mouth 3 (three)  times daily as needed for cough.  . Blood Glucose Calibration (ACCU-CHEK AVIVA) SOLN Use as instructed to test blood sugar ICD 10 E11 51  . Blood Glucose Monitoring Suppl (ACCU-CHEK AVIVA PLUS) W/DEVICE KIT Use as directed  . Continuous Blood Gluc Receiver (FREESTYLE LIBRE 14 DAY READER) DEVI 1 Device by Does not apply route every 14 (fourteen) days.  . Continuous Blood Gluc Sensor (FREESTYLE LIBRE 14 DAY SENSOR) MISC 2 each by Does not apply route every 14 (fourteen) days.  . famotidine (PEPCID) 20 MG tablet Take 1 tablet (20 mg total) by mouth daily.  . feeding supplement, ENSURE ENLIVE, (ENSURE ENLIVE) LIQD Take 237 mLs by mouth 2 (two) times daily between meals.  . fexofenadine (ALLEGRA) 180 MG tablet Take 180 mg by mouth daily as needed for allergies.   [name] glucose blood (ACCU-CHEK AVIVA PLUS) test strip Use as instructed to test blood sugar 6 times daily. DX Code: E11.59  . HYDROcodone-acetaminophen (NORCO) 7.5-325 MG tablet Take 1 tablet by mouth every 6 (six) hours as needed (for chronic joint pain).  [name] ibuprofen (ADVIL,MOTRIN) 800 MG tablet Take 800 mg by mouth every 8 (eight) hours as needed for headache or moderate pain.   [name] ipratropium (ATROVENT) 0.02 % nebulizer solution Take 2.5 mLs (0.5 mg total) by nebulization every 6 (six) hours.  Elmore Guise Devices (AUTOLET IMPRESSION) MISC Use as instructed to test blood sugar ICD 10 E11 51  . Lancets MISC Use to check blood sugars 6 times daily. DX Code: E11.59  . levalbuterol (XOPENEX) 1.25 MG/3ML nebulizer solution Take 1.25 mg by nebulization every 6 (six) hours.  [name] LORazepam (ATIVAN) 1 MG tablet TAKE 1/2 TO 1 TABLET BY MOUTH DAILY AS NEEDED (Patient taking differently: Take 0.5-1 mg by mouth at bedtime as needed for sleep. )  . metFORMIN (GLUCOPHAGE) 500 MG tablet TAKE 1 TABLET BY MOUTH  DAILY WITH BREAKFAST. (Patient taking differently: Take 500 mg by mouth daily with breakfast. )  . montelukast (SINGULAIR) 10 MG tablet TAKE 1 TABLET BY MOUTH   DAILY (Patient taking differently: Take 10 mg by mouth at bedtime. )  . Multiple Vitamin (MULTIVITAMIN WITH MINERALS) TABS tablet Take 1 tablet by mouth daily.  . nebivolol (BYSTOLIC) 5 MG tablet Take 1 tablet (5 mg total) by mouth daily.  . nitroGLYCERIN (NITROSTAT) 0.4 MG SL tablet Place 0.4 mg under the tongue every 5 (five) minutes x 3 doses as needed for chest pain.  . predniSONE (DELTASONE) 10 MG tablet Take 1 tablet (10 mg total) by mouth daily.  . predniSONE (DELTASONE) 10 MG tablet Take 21m daily for 3days,Take 363mdaily for 3days,Take 2079maily for 3days,Take 70m57mily for 3days, then stop  . rosuvastatin (CRESTOR) 5 MG tablet TAKE 1 TABLET BY MOUTH  EVERY MONDAY, WEDNESDAY,  AND FRIDAY (Patient taking differently: Take 5 mg by mouth every Monday, Wednesday, and Friday. )  . SYMBICORT 160-4.5 MCG/ACT inhaler INHALE 1 TO 2 PUFFS INTO  THE LUNGS  EVERY 12 HOURS AS NEEDED (Patient taking differently: Inhale 2 puffs into the lungs 2 (two) times daily. )  . vardenafil (LEVITRA) 20 MG tablet TAKE 1 TABLET BY MOUTH AS DIRECTED. CAN NOT BE TAKEN WITH NITROGLYCERIN. (Patient taking differently: Take 20 mg by mouth daily as needed for erectile dysfunction (Cannot be taken with Nitroglycerin). )     Allergies:   Contrast media [iodinated diagnostic agents]   Social History   Socioeconomic History  . Marital status: Married    Spouse name: Not on file  . Number of children: Not on file  . Years of education: Not on file  . Highest education level: Not on file  Occupational History  . Not on file  Social Needs  . Financial resource strain: Not on file  . Food insecurity:    Worry: Not on file    Inability: Not on file  . Transportation needs:    Medical: Not on file    Non-medical: Not on file  Tobacco Use  . Smoking status: Former Smoker    Packs/day: 2.00    Years: 2.00    Pack years: 4.00    Types: Cigarettes    Last attempt to quit: 12/14/1963    Years since quitting: 55.0  .  Smokeless tobacco: Never Used  Substance and Sexual Activity  . Alcohol use: Not Currently    Alcohol/week: 2.0 standard drinks    Types: 2 Glasses of wine per week    Comment: occasional  . Drug use: Never  . Sexual activity: Yes  Lifestyle  . Physical activity:    Days per week: Not on file    Minutes per session: Not on file  . Stress: Not on file  Relationships  . Social connections:    Talks on phone: Not on file    Gets together: Not on file    Attends religious service: Not on file    Active member of club or organization: Not on file    Attends meetings of clubs or organizations: Not on file    Relationship status: Not on file  Other Topics Concern  . Not on file  Social History Narrative  . Not on file     Family History: The patient's family history includes Colon polyps in his father; Diabetes in his maternal aunt; Heart attack in his father; Heart failure in his mother; Hypertension in his mother; Subarachnoid hemorrhage (age of onset: 32) in his mother; Subarachnoid hemorrhage (age of onset: 54) in his paternal grandfather. There is no history of Colon cancer.  ROS:   Please see the history of present illness.    Cough, wheezing, dyspnea, left foot drop, new.  An episode of left arm pain prior to admission to the hospital uncertain etiology.  All other systems reviewed and are negative.  EKGs/Labs/Other Studies Reviewed:    The following studies were reviewed today: No new cardiac evaluation was done during the recent hospital stay.  EKG:  EKG was last performed in December 2019 demonstrating atrial rhythm with ventricular tracking and pacing.  Recent Labs: 12/11/2018: ALT 31; B Natriuretic Peptide 79.8; Hemoglobin 12.3; Platelets 190 12/12/2018: BUN 24; Creatinine, Ser 1.24; Potassium 4.0; Sodium 138  Recent Lipid Panel    Component Value Date/Time   CHOL 157 03/27/2018 0840   CHOL 213 (H) 08/29/2017 1122   CHOL 143 08/20/2014 0750   TRIG 122.0  03/27/2018 0840   TRIG 145 08/20/2014 0750   HDL 29.80 (L) 03/27/2018 0840  HDL 41 08/29/2017 1122   HDL 33 (L) 08/20/2014 0750   CHOLHDL 5 03/27/2018 0840   VLDL 24.4 03/27/2018 0840   LDLCALC 103 (H) 03/27/2018 0840   LDLCALC 139 (H) 08/29/2017 1122   LDLCALC 81 08/20/2014 0750    Physical Exam:    VS:  BP 96/62   Pulse 79   Ht _0  (1.753 m)   Wt 179 lb 12.8 oz (81.6 kg)   SpO2 96%   BMI 26.55 kg/m     Wt Readings from Last 3 Encounters:  12/18/18 179 lb 12.8 oz (81.6 kg)  12/11/18 178 lb 9.2 oz (81 kg)  11/30/18 184 lb (83.5 kg)     GEN: Skin is pink and he appears healthy.. No acute distress HEENT: Normal NECK: No JVD. LYMPHATICS: No lymphadenopathy CARDIAC: RRR.  No murmur, gallop, edema VASCULAR: Pulses 2+ bilateral radial and carotid, Bruits are absent in the carotid space. RESPIRATORY:  Clear to auscultation without rales, wheezing or rhonchi  ABDOMEN: Soft, non-tender, non-distended, No pulsatile mass, MUSCULOSKELETAL: No deformity  SKIN: Warm and dry NEUROLOGIC:  Alert and oriented x 3 PSYCHIATRIC:  Normal affect   ASSESSMENT:    1. Chronic combined systolic and diastolic CHF (congestive heart failure) ([place])   2. Mobitz type 2 second degree heart block   3. Coronary artery disease involving native coronary artery of native heart without angina pectoris   4. Essential hypertension   5. OSA (obstructive sleep apnea)    PLAN:    In order of problems listed above:  1. Stable without evidence of volume overload.  ACE inhibitor therapy has been discontinued.  Blood pressure is relatively low.  This is likely a combination of deconditioning and weight loss.  Will reinstitute angiotensin/renin system inhibition in 3 months or earlier after he recovers.  Would not resume ACE inhibitor therapy but instead try ARB if okay with pulmonary.  ARB therapy also provides kidney protection in this patient with diabetes. 2. Cured with pacemaker therapy 3. Episode of  left arm pain requiring sublingual nitroglycerin 6 weeks ago.  No recurrence.  No work-up at this time and recent severe hypoxic respiratory failure without significant cardiac complications argues against significant underlying obstructive disease. 4. Blood pressure is relatively low.  Continue low-dose beta-blocker therapy.  Resume ARB/angiotensin blockade if okay with pulmonology once his blood pressure recovers back to baseline.  Clinical follow-up with me in 3 months.  At that time we will consider whether to resume RAAS inhibition in the form of an ARB.  This will be helpful from the standpoint of heart failure and kidney protection in setting of diabetes.  Should also consider SGLT2 therapy since he is diabetic.  Significant time was spent reviewing the recent hospital stay and pre-existing outpatient records for evidence of cardiac etiology related to respiratory status.   Medication Adjustments/Labs and Tests Ordered: Current medicines are reviewed at length with the patient today.  Concerns regarding medicines are outlined above.  No orders of the defined types were placed in this encounter.  No orders of the defined types were placed in this encounter.   Patient Instructions  Medication Instructions:  Your physician recommends that you continue on your current medications as directed. Please refer to the Current Medication list given to you today.  If you need a refill on your cardiac medications before your next appointment, please call your pharmacy.   Lab work: None If you have labs (blood work) drawn today and your tests are completely  normal, you will receive your results only by: [name] MyChart Message (if you have MyChart) OR . A paper copy in the mail If you have any lab test that is abnormal or we need to change your treatment, we will call you to review the results.  Testing/Procedures: None  Follow-Up: At [facility], you and your health needs are our priority.  As  part of our continuing mission to provide you with exceptional heart care, we have created designated Provider Care Teams.  These Care Teams include your primary Cardiologist (physician) and Advanced Practice Providers (APPs -  Physician Assistants and Nurse Practitioners) who all work together to provide you with the care you need, when you need it. You will need a follow up appointment in 3 months.  Please call our office 2 months in advance to schedule this appointment.  You may see Dr. Tamala Julian or one of the following Advanced Practice Providers on your designated Care Team:   Truitt Merle, NP Cecilie Kicks, NP . Kathyrn Drown, NP  Any Other Special Instructions Will Be Listed Below (If Applicable).       Signed, Sinclair Grooms, MD  12/18/2018 12:55 PM    [place]

## 2018-12-18 ENCOUNTER — Ambulatory Visit: Payer: Medicare Other | Admitting: Interventional Cardiology

## 2018-12-18 ENCOUNTER — Encounter: Payer: Self-pay | Admitting: Interventional Cardiology

## 2018-12-18 VITALS — BP 96/62 | HR 79 | Ht 69.0 in | Wt 179.8 lb

## 2018-12-18 DIAGNOSIS — I5042 Chronic combined systolic (congestive) and diastolic (congestive) heart failure: Secondary | ICD-10-CM

## 2018-12-18 DIAGNOSIS — I2510 Atherosclerotic heart disease of native coronary artery without angina pectoris: Secondary | ICD-10-CM

## 2018-12-18 DIAGNOSIS — I441 Atrioventricular block, second degree: Secondary | ICD-10-CM

## 2018-12-18 DIAGNOSIS — G4733 Obstructive sleep apnea (adult) (pediatric): Secondary | ICD-10-CM

## 2018-12-18 DIAGNOSIS — I10 Essential (primary) hypertension: Secondary | ICD-10-CM | POA: Diagnosis not present

## 2018-12-18 NOTE — Patient Instructions (Signed)
Medication Instructions:  Your physician recommends that you continue on your current medications as directed. Please refer to the Current Medication list given to you today.  If you need a refill on your cardiac medications before your next appointment, please call your pharmacy.   Lab work: None If you have labs (blood work) drawn today and your tests are completely normal, you will receive your results only by: . MyChart Message (if you have MyChart) OR . A paper copy in the mail If you have any lab test that is abnormal or we need to change your treatment, we will call you to review the results.  Testing/Procedures: None  Follow-Up: At [facility], you and your health needs are our priority.  As part of our continuing mission to provide you with exceptional heart care, we have created designated Provider Care Teams.  These Care Teams include your primary Cardiologist (physician) and Advanced Practice Providers (APPs -  Physician Assistants and Nurse Practitioners) who all work together to provide you with the care you need, when you need it. You will need a follow up appointment in 3 months.  Please call our office 2 months in advance to schedule this appointment.  You may see Dr. Smith or one of the following Advanced Practice Providers on your designated Care Team:   Lori Gerhardt, NP Laura Ingold, NP . Jill McDaniel, NP  Any Other Special Instructions Will Be Listed Below (If Applicable).    

## 2018-12-18 NOTE — Progress Notes (Signed)
Subjective:    Patient ID: Jeffery [name], male    DOB: [birth year], 78 y.o.   MRN: [id number]  HPI The patient is here for follow up from the hospital.  Admitted 12/11/2018-12/13/2018 for acute respiratory failure with hypoxia.  He went to the hospital because of shortness of breath and cough that he had had since October and has been seen here on more than one occasion.  Throughout his treatment course he had received 3 rounds of antibiotics and 3 rounds of steroids.  He was still experiencing a productive cough and dyspnea at rest along with chest tightness.  He stated mild right foot drop that started 1 week after finishing Levaquin.  He denied any pain.  In the ED he was tachycardic, blood pressure was 54G-92E systolically which improved with fluids.  Oxygen saturation 80% on room air.  There is no leukocytosis.  BNP was normal.  Influenza panel negative.  Troponin 0 0.04-stable.  CXR - no active cardiopulmonary disease.  EKG paced rhythm.  Chest x-ray showed no CT Angio of the chest showed pneumonitis bilateral lower lobes.  Rhinovirus was positive suggesting viral pneumonia.  Acute hypoxic respiratory failure secondary to pneumonitis, asthma exacerbation: Initially hypoxic CT scan showed pneumonitis bilateral lower lobes Rhinovirus positive-likely viral pneumonitis Steroids continued Duo nebs Inhalers on discharge  Elevated troponin No trend-flat Likely demand related  Mobitz type II second-degree heart block Pacemaker in place  Foot drop, left Started 1 week after finishing Levaquin No tendinopathy ?  Diabetes induced Consider outpatient nerve conduction  Mild hyponatremia Sodium normalized with fluids  Type 2 diabetes A1c 8.1 Likely related to steroids SS while in hospital Restarted on metformin, glipizide on discharge  Hypertension: ACE stopped Cardiology would like to restart ARB once BP improves   Acute resp failure, asthma exac, viral  penumonitis: Tomorrow he starts 20 mg of prednisone. Symptoms improved overall - still with productive cough, wheeze and sob  Taking nebs, inhalers Has pulm appt tomorrow  Diabetes; Sugars variable at home - can be above 200 - taking glipizide as needed Taking metformin daily Not eating much due to poor appetite  Physical deconditioning: Very deconditioned due to steroids and inactivity Will try to start walking Deferred PT at this time  Left foot drop: Uses a splint Would like to see neuro / get EMG  Hypertension: He is taking his medication daily. He is compliant with a low sodium diet.  He denies chest pain, palpitations, edema     Medications and allergies reviewed with patient and updated if appropriate.  Patient Active Problem List   Diagnosis Date Noted  . Viral pneumonia 12/12/2018  . Acute respiratory failure with hypoxia ([place]) 12/11/2018  . Community acquired pneumonia 11/18/2018  . Moderate persistent asthma with exacerbation 10/27/2018  . Folliculitis 09/18/1218  . Chronic systolic heart failure ([place]) 07/25/2018  . Anxiety 03/26/2018  . Metatarsal bone fracture 03/07/2018  . Encounter for chronic pain management 04/13/2017  . Osteoarthritis 04/12/2017  . Chronic combined systolic and diastolic CHF (congestive heart failure) ([place]) 01/24/2015  . OSA (obstructive sleep apnea) 10/03/2014  . Left bundle branch block 11/27/2013  . Essential hypertension 11/27/2013  . GERD (gastroesophageal reflux disease) 02/27/2013  . Type 2 diabetes mellitus with vascular disease ([place]) 04/09/2009  . HLD (hyperlipidemia) 09/06/2008  . Coronary atherosclerosis 09/06/2008  . Asthma 09/06/2008    Current Outpatient Medications on File Prior to Visit  Medication Sig Dispense Refill  . ACCU-CHEK SOFTCLIX LANCETS lancets Use to  test blood sugar daily as directed. 100 each 3  . albuterol (PROAIR HFA) 108 (90 BASE) MCG/ACT inhaler Inhale 2 puffs into the lungs every 6 (six) hours as  needed for wheezing or shortness of breath.     [name] albuterol (PROVENTIL) (2.5 MG/3ML) 0.083% nebulizer solution Take 3 mLs (2.5 mg total) by nebulization every 6 (six) hours as needed for wheezing or shortness of breath. 150 mL 5  . aspirin 81 MG tablet Take 81 mg by mouth at bedtime.      . benzonatate (TESSALON PERLES) 100 MG capsule Take 1 capsule (100 mg total) by mouth 3 (three) times daily as needed for cough. 30 capsule 0  . Blood Glucose Calibration (ACCU-CHEK AVIVA) SOLN Use as instructed to test blood sugar ICD 10 E11 51 1 each 3  . Blood Glucose Monitoring Suppl (ACCU-CHEK AVIVA PLUS) W/DEVICE KIT Use as directed 1 kit 0  . Continuous Blood Gluc Receiver (FREESTYLE LIBRE 14 DAY READER) DEVI 1 Device by Does not apply route every 14 (fourteen) days. 1 Device 0  . Continuous Blood Gluc Sensor (FREESTYLE LIBRE 14 DAY SENSOR) MISC 2 each by Does not apply route every 14 (fourteen) days. 2 each 2  . famotidine (PEPCID) 20 MG tablet Take 1 tablet (20 mg total) by mouth daily. 30 tablet 0  . feeding supplement, ENSURE ENLIVE, (ENSURE ENLIVE) LIQD Take 237 mLs by mouth 2 (two) times daily between meals. 237 mL 12  . fexofenadine (ALLEGRA) 180 MG tablet Take 180 mg by mouth daily as needed for allergies.     [name] glucose blood (ACCU-CHEK AVIVA PLUS) test strip Use as instructed to test blood sugar 6 times daily. DX Code: E11.59 600 each 3  . HYDROcodone-acetaminophen (NORCO) 7.5-325 MG tablet Take 1 tablet by mouth every 6 (six) hours as needed (for chronic joint pain). 30 tablet 0  . ibuprofen (ADVIL,MOTRIN) 800 MG tablet Take 800 mg by mouth every 8 (eight) hours as needed for headache or moderate pain.     [name] ipratropium (ATROVENT) 0.02 % nebulizer solution Take 2.5 mLs (0.5 mg total) by nebulization every 6 (six) hours. 75 mL 0  . Lancet Devices (AUTOLET IMPRESSION) MISC Use as instructed to test blood sugar ICD 10 E11 51 1 each 3  . Lancets MISC Use to check blood sugars 6 times daily. DX Code:  E11.59 600 each 3  . levalbuterol (XOPENEX) 1.25 MG/3ML nebulizer solution Take 1.25 mg by nebulization every 6 (six) hours. 120 mL 0  . LORazepam (ATIVAN) 1 MG tablet TAKE 1/2 TO 1 TABLET BY MOUTH DAILY AS NEEDED (Patient taking differently: Take 0.5-1 mg by mouth at bedtime as needed for sleep. ) 30 tablet 0  . metFORMIN (GLUCOPHAGE) 500 MG tablet TAKE 1 TABLET BY MOUTH  DAILY WITH BREAKFAST. (Patient taking differently: Take 500 mg by mouth daily with breakfast. ) 90 tablet 1  . montelukast (SINGULAIR) 10 MG tablet TAKE 1 TABLET BY MOUTH  DAILY (Patient taking differently: Take 10 mg by mouth at bedtime. ) 90 tablet 2  . Multiple Vitamin (MULTIVITAMIN WITH MINERALS) TABS tablet Take 1 tablet by mouth daily.    . nebivolol (BYSTOLIC) 5 MG tablet Take 1 tablet (5 mg total) by mouth daily.    . nitroGLYCERIN (NITROSTAT) 0.4 MG SL tablet Place 0.4 mg under the tongue every 5 (five) minutes x 3 doses as needed for chest pain.    . predniSONE (DELTASONE) 10 MG tablet Take 1 tablet (10 mg total) by  mouth daily. 30 tablet 0  . predniSONE (DELTASONE) 10 MG tablet Take 65m daily for 3days,Take 385mdaily for 3days,Take 2075maily for 3days,Take 33m22mily for 3days, then stop 30 tablet 0  . rosuvastatin (CRESTOR) 5 MG tablet TAKE 1 TABLET BY MOUTH  EVERY MONDAY, WEDNESDAY,  AND FRIDAY (Patient taking differently: Take 5 mg by mouth every Monday, Wednesday, and Friday. ) 45 tablet 0  . SYMBICORT 160-4.5 MCG/ACT inhaler INHALE 1 TO 2 PUFFS INTO  THE LUNGS EVERY 12 HOURS AS NEEDED (Patient taking differently: Inhale 2 puffs into the lungs 2 (two) times daily. ) 30.6 g 1  . vardenafil (LEVITRA) 20 MG tablet TAKE 1 TABLET BY MOUTH AS DIRECTED. CAN NOT BE TAKEN WITH NITROGLYCERIN. (Patient taking differently: Take 20 mg by mouth daily as needed for erectile dysfunction (Cannot be taken with Nitroglycerin). ) 6 tablet 2   No current facility-administered medications on file prior to visit.     Past Medical  History:  Diagnosis Date  . Arthritis    "thumbs" (07/25/2018)  . Asthma   . CHF (congestive heart failure) (HCC)[place]. Chronic bronchitis (HCC)[place]. Environmental allergies   . GERD (gastroesophageal reflux disease)   . Headache    "seasonal; w/environmental allergies" (07/25/2018)  . History of blood transfusion    "when I had laminectomy" (07/25/2018)  . HTN (hypertension)   . Hyperlipidemia   . LBBB (left bundle branch block) 1999  . [facility] "was told I've had an old MI; probably in the 19907s/13/2019)  . Pneumonia    "as a child, age 54; v65ral pneumonia 3 times in the last 10 years" (07/25/2018)  . Presence of permanent cardiac pacemaker 07/25/2018  . Seasonal allergies   . Sleep apnea    "wife says I do" (07/25/2018)  . Type II diabetes mellitus (HCC)[place]  Past Surgical History:  Procedure Laterality Date  . BACK SURGERY    . BIV PACEMAKER INSERTION CRT-P  07/25/2018  . BIV PACEMAKER INSERTION CRT-P N/A 07/25/2018   Procedure: BIV PACEMAKER INSERTION CRT-P;  Surgeon: TaylEvans Lance;  [facility];  Service: Cardiovascular;  Laterality: N/A;  . CARDIAC CATHETERIZATION  2003   Dr HankPernell Dupre % R circumflex obstruction  . CARPAL TUNNEL RELEASE Left 10/11/2014   Procedure: LEFT CARPAL TUNNEL RELEASE;  Surgeon: WillRoseanne Kaufman;  [facility];  Laterality: Left;  . COLONOSCOPY W/ BIOPSIES AND POLYPECTOMY  2018  . INGUINAL HERNIA REPAIR Right 1948  . INGUINAL HERNIA REPAIR Left 1988  . LUMBAR LAMINECTOMY  1984  . MINOR CARPAL TUNNEL Right 11/22/2014   Procedure: RIGHT LIMITED OPEN CARPAL TUNNEL RELEASE;  Surgeon: WillRoseanne Kaufman;  [place];  Laterality: Right;  . TONSILLECTOMY  1958    Social History   Socioeconomic History  . Marital status: Married    Spouse name: Not on file  . Number of children: Not on file  . Years of  education: Not on file  . Highest education level: Not on file  Occupational History  . Not on file  Social Needs  . Financial resource strain: Not on file  . Food insecurity:    Worry: Not on file    Inability: Not on file  . Transportation needs:    Medical: Not on file    Non-medical: Not on file  Tobacco  Use  . Smoking status: Former Smoker    Packs/day: 2.00    Years: 2.00    Pack years: 4.00    Types: Cigarettes    Last attempt to quit: 12/14/1963    Years since quitting: 55.0  . Smokeless tobacco: Never Used  Substance and Sexual Activity  . Alcohol use: Not Currently    Alcohol/week: 2.0 standard drinks    Types: 2 Glasses of wine per week    Comment: occasional  . Drug use: Never  . Sexual activity: Yes  Lifestyle  . Physical activity:    Days per week: Not on file    Minutes per session: Not on file  . Stress: Not on file  Relationships  . Social connections:    Talks on phone: Not on file    Gets together: Not on file    Attends religious service: Not on file    Active member of club or organization: Not on file    Attends meetings of clubs or organizations: Not on file    Relationship status: Not on file  Other Topics Concern  . Not on file  Social History Narrative  . Not on file    Family History  Problem Relation Age of Onset  . Heart attack Father        74s  . Colon polyps Father   . Heart failure Mother        90s  . Subarachnoid hemorrhage Mother 79  . Hypertension Mother   . Subarachnoid hemorrhage Paternal Grandfather 38  . Diabetes Maternal Aunt   . Colon cancer Neg Hx     Review of Systems  Constitutional: Negative for chills and fever.  HENT: Negative for congestion, ear pain, sinus pain and sore throat.   Respiratory: Positive for cough, chest tightness, shortness of breath (with exertion) and wheezing.   Cardiovascular: Negative for chest pain, palpitations and leg swelling.  Gastrointestinal: Negative for abdominal pain and  nausea.  Psychiatric/Behavioral: Negative for sleep disturbance.       Objective:   Vitals:   12/19/18 1255  BP: 134/68  Pulse: 82  Resp: 16  Temp: 98.1 F (36.7 C)  SpO2: 97%   BP Readings from Last 3 Encounters:  12/19/18 134/68  12/18/18 96/62  12/13/18 124/66   Wt Readings from Last 3 Encounters:  12/19/18 175 lb 12.8 oz (79.7 kg)  12/18/18 179 lb 12.8 oz (81.6 kg)  12/11/18 178 lb 9.2 oz (81 kg)   Body mass index is 25.96 kg/m.   Physical Exam    Constitutional: Appears well-developed and well-nourished. No distress.  HENT:  Head: Normocephalic and atraumatic.  Neck: Neck supple. No tracheal deviation present. No thyromegaly present.  No cervical lymphadenopathy Cardiovascular: Normal rate, regular rhythm and normal heart sounds.   No murmur heard. No carotid bruit .  No edema Pulmonary/Chest: Effort normal and breath sounds normal. No respiratory distress. No has no wheezes. No rales.  Skin: Skin is warm and dry. Not diaphoretic.  Psychiatric: Normal mood and affect. Behavior is normal.   CT ANGIO CHEST PE W OR WO CONTRAST CLINICAL DATA:  Cough, chest congestion, generalized weakness, mildly elevated troponin.  EXAM: CT ANGIOGRAPHY CHEST WITH CONTRAST  TECHNIQUE: Multidetector CT imaging of the chest was performed using the standard protocol during bolus administration of intravenous contrast. Multiplanar CT image reconstructions and MIPs were obtained to evaluate the vascular anatomy.  CONTRAST:  46m ISOVUE-370 IOPAMIDOL (ISOVUE-370) INJECTION 76%  COMPARISON:  Chest radiographs obtained  earlier today.  FINDINGS: Cardiovascular: Epicardial and transvenous pacemaker leads. Normal sized heart. Normally opacified pulmonary arteries with no pulmonary arterial filling defects seen.  Mediastinum/Nodes: No enlarged mediastinal, hilar, or axillary lymph nodes. Thyroid gland, trachea, and esophagus demonstrate no significant findings.  Lungs/Pleura:  Areas of patchy ground-glass interstitial prominence in both upper lobes. Micro nodular interstitial prominence in both lower lobes. Mild cylindrical bronchiectasis in the right lower lobe with associated adjacent parenchymal opacities. Diffuse peribronchial thickening. No pleural fluid.  Upper Abdomen: Unremarkable.  Musculoskeletal: Thoracic and lower cervical spine degenerative changes.  Review of the MIP images confirms the above findings.  IMPRESSION: 1. Patchy ground-glass interstitial prominence in both upper lobes and micronodular interstitial prominence in both lower lobes, most compatible with an infectious pneumonitis. 2. No pulmonary emboli. 3. Mild cylindrical bronchiectasis in the right lower lobe with associated adjacent parenchymal scarring/inflammation. 4. Moderate bronchitic changes.  Electronically Signed   By: Claudie Revering M.D.   On: 12/11/2018 21:11 DG Chest 2 View CLINICAL DATA:  Cough and congestion.  EXAM: CHEST - 2 VIEW  COMPARISON:  11/17/2018  FINDINGS: There is a left chest wall pacer device with leads in the right atrial appendage and right ventricle. Normal heart size. No pleural effusion or edema. No airspace opacities identified. Osseous structures are unremarkable.  IMPRESSION:  No active cardiopulmonary disease.  Electronically Signed   By: Kerby Moors M.D.   On: 12/11/2018 12:01   Lab Results  Component Value Date   WBC 8.4 12/11/2018   HGB 12.3 (L) 12/11/2018   HCT 38.9 (L) 12/11/2018   PLT 190 12/11/2018   GLUCOSE 179 (H) 12/12/2018   CHOL 157 03/27/2018   TRIG 122.0 03/27/2018   HDL 29.80 (L) 03/27/2018   LDLCALC 103 (H) 03/27/2018   ALT 31 12/11/2018   AST 21 12/11/2018   NA 138 12/12/2018   K 4.0 12/12/2018   CL 101 12/12/2018   CREATININE 1.24 12/12/2018   BUN 24 (H) 12/12/2018   CO2 25 12/12/2018   TSH 1.47 09/27/2014   PSA 0.79 08/31/2012   HGBA1C 8.1 (H) 12/12/2018   MICROALBUR <0.7 03/27/2018      Assessment & Plan:    See Problem List for Assessment and Plan of chronic medical problems.

## 2018-12-19 ENCOUNTER — Ambulatory Visit: Payer: Medicare Other | Admitting: Internal Medicine

## 2018-12-19 ENCOUNTER — Encounter: Payer: Self-pay | Admitting: Internal Medicine

## 2018-12-19 VITALS — BP 134/68 | HR 82 | Temp 98.1°F | Resp 16 | Ht 69.0 in | Wt 175.8 lb

## 2018-12-19 DIAGNOSIS — J129 Viral pneumonia, unspecified: Secondary | ICD-10-CM | POA: Diagnosis not present

## 2018-12-19 DIAGNOSIS — I10 Essential (primary) hypertension: Secondary | ICD-10-CM

## 2018-12-19 DIAGNOSIS — M21372 Foot drop, left foot: Secondary | ICD-10-CM | POA: Diagnosis not present

## 2018-12-19 DIAGNOSIS — E1159 Type 2 diabetes mellitus with other circulatory complications: Secondary | ICD-10-CM

## 2018-12-19 DIAGNOSIS — J9601 Acute respiratory failure with hypoxia: Secondary | ICD-10-CM

## 2018-12-19 NOTE — Assessment & Plan Note (Signed)
Oxygen sat 97% on RA No resp distress Symptoms improving as asthma and pneumonitis improve

## 2018-12-19 NOTE — Assessment & Plan Note (Signed)
Positive for rhino virus

## 2018-12-19 NOTE — Assessment & Plan Note (Signed)
Was controlled with diet prior to steroids Taking metformin and glipizide and controlling sugars Sugars variable  Sugars will likely improve once off steroids Will taper meds per sugars Should consider jardiance or farxiga in near future given cardiac history

## 2018-12-19 NOTE — Assessment & Plan Note (Signed)
Controlled  May need medication adjusted in near future Dr Tamala Julian would like him eventually on an ARB

## 2018-12-19 NOTE — Patient Instructions (Signed)
A referral for neurology was ordered.   Continue your current medications.    Update me regarding your sugars.

## 2018-12-20 ENCOUNTER — Encounter: Payer: Self-pay | Admitting: Pulmonary Disease

## 2018-12-20 ENCOUNTER — Ambulatory Visit (INDEPENDENT_AMBULATORY_CARE_PROVIDER_SITE_OTHER): Payer: Medicare Other | Admitting: Pulmonary Disease

## 2018-12-20 ENCOUNTER — Telehealth: Payer: Self-pay | Admitting: Internal Medicine

## 2018-12-20 VITALS — BP 118/72 | HR 104 | Ht 69.0 in | Wt 177.0 lb

## 2018-12-20 DIAGNOSIS — J471 Bronchiectasis with (acute) exacerbation: Secondary | ICD-10-CM

## 2018-12-20 DIAGNOSIS — R05 Cough: Secondary | ICD-10-CM | POA: Diagnosis not present

## 2018-12-20 DIAGNOSIS — J45909 Unspecified asthma, uncomplicated: Secondary | ICD-10-CM

## 2018-12-20 DIAGNOSIS — R0602 Shortness of breath: Secondary | ICD-10-CM | POA: Diagnosis not present

## 2018-12-20 DIAGNOSIS — J129 Viral pneumonia, unspecified: Secondary | ICD-10-CM

## 2018-12-20 DIAGNOSIS — J479 Bronchiectasis, uncomplicated: Secondary | ICD-10-CM | POA: Insufficient documentation

## 2018-12-20 DIAGNOSIS — I5022 Chronic systolic (congestive) heart failure: Secondary | ICD-10-CM

## 2018-12-20 MED ORDER — LEVALBUTEROL HCL 1.25 MG/3ML IN NEBU: 1.2500 mg | INHALATION_SOLUTION | Freq: Four times a day (QID) | RESPIRATORY_TRACT | 3 refills | Status: DC

## 2018-12-20 MED ORDER — IPRATROPIUM BROMIDE 0.02 % IN SOLN: 0.5000 mg | Freq: Four times a day (QID) | RESPIRATORY_TRACT | 3 refills | Status: DC

## 2018-12-20 MED ORDER — FLUTTER DEVI: 0 refills | Status: DC

## 2018-12-20 NOTE — Assessment & Plan Note (Signed)
Plan: Continue follow-up with cardiology 

## 2018-12-20 NOTE — Telephone Encounter (Signed)
I let neuro know

## 2018-12-20 NOTE — Telephone Encounter (Signed)
Pt wants to see neuro first then do and get an EMG.

## 2018-12-20 NOTE — Telephone Encounter (Signed)
LVM for pt to call back.

## 2018-12-20 NOTE — Telephone Encounter (Signed)
Please call him -- neuro can get him in sooner if he just wants an EMG but if he wants to see neuro and get an EMG then it may take a little longer -- just need to know what he prefers.

## 2018-12-20 NOTE — Progress Notes (Signed)
_0  ID: Jeffery [name], male    DOB: 1941-12-05, 78 y.o.   MRN: [id number]  Chief Complaint  Patient presents with  . Hospitalization Follow-up    Pneumonia follow-up    Referring provider: [name], MD  HPI:  78 year old male former smoker followed in our office for asthma  >>> Patient was previously seen in the hospital December/2019 for a viral pneumonia causing acute respiratory failure with hypoxia  PMH: CHF, type 2 diabetes, hyperlipidemia, GERD, hypertension Smoker/ Smoking History: Former smoker. Maintenance:  Symbicort 160 Pt of: Dr. Valeta Harms  Recent [facility] Encounters:   11/30/2018-office visit-Icard Plan: For PFTs, continue Symbicort, continue Singulair, continue follow-up with cardiology for management of heart failure  12/20/2018  - Visit   78 year old male former smoker (small smoking history -5 pack years) presenting to our office today for a hospital follow-up.  Patient was recently treated for a viral pneumonia caused by rhinovirus.  Patient reports that since being discharged from the hospital he does feel that he is slowly starting to improve.  Patient is still quite fatigued and does still have a persisting cough.  Patient is aggressively using his incentive spirometer and using nebulized treatments every 6 hours.  Although patient is not back to 100% of where he was prior to this pneumonia he does feel he is slowly improving.  Patient is completed follow-up with his primary care doctor on 12/19/2018.  Patient is still reporting a productive cough.  Patient cannot tell us what color his mucus is.  Patient was found to have mild cylindrical bronchiectasis on CT in the hospital.  Patient does not have a flutter valve at home.  Patient has developed left foot drop.  This was evaluated by physical therapy in the hospital and patient has been referred to neurology by his primary care doctor.   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.   Patient also is primary caregiver to 2 grandchildren ages 58 and 67.  Those are believed to be the sick contacts that may have caused the viral pneumonia.    Tests:   12/12/2018-respiratory virus panel- rhinovirus  12/11/2018-CT Angio- areas of patchy groundglass interstitial prominence in both upper lobes, mild cylindrical bronchiectasis in the right lower lobe  06/23/2018-echocardiogram-LV ejection fraction 40 to 40%, grade 2 diastolic dysfunction  FENO:  No results found for: NITRICOXIDE  PFT: No flowsheet data found.  Imaging: Dg Chest 2 View  Result Date: 12/11/2018 CLINICAL DATA:  Cough and congestion. EXAM: CHEST - 2 VIEW COMPARISON:  11/17/2018 FINDINGS: There is a left chest wall pacer device with leads in the right atrial appendage and right ventricle. Normal heart size. No pleural effusion or edema. No airspace opacities identified. Osseous structures are unremarkable. IMPRESSION: No active cardiopulmonary disease. Electronically Signed   By: Kerby Moors M.D.   On: 12/11/2018 12:01   Ct Angio Chest Pe W Or Wo Contrast  Result Date: 12/11/2018 CLINICAL DATA:  Cough, chest congestion, generalized weakness, mildly elevated troponin. EXAM: CT ANGIOGRAPHY CHEST WITH CONTRAST TECHNIQUE: Multidetector CT imaging of the chest was performed using the standard protocol during bolus administration of intravenous contrast. Multiplanar CT image reconstructions and MIPs were obtained to evaluate the vascular anatomy. CONTRAST:  53m ISOVUE-370 IOPAMIDOL (ISOVUE-370) INJECTION 76% COMPARISON:  Chest radiographs obtained earlier today. FINDINGS: Cardiovascular: Epicardial and transvenous pacemaker leads. Normal sized heart. Normally opacified pulmonary arteries with no pulmonary arterial filling defects seen. Mediastinum/Nodes: No enlarged mediastinal, hilar, or axillary lymph nodes. Thyroid  gland, trachea, and esophagus demonstrate no significant findings. Lungs/Pleura: Areas of patchy  ground-glass interstitial prominence in both upper lobes. Micro nodular interstitial prominence in both lower lobes. Mild cylindrical bronchiectasis in the right lower lobe with associated adjacent parenchymal opacities. Diffuse peribronchial thickening. No pleural fluid. Upper Abdomen: Unremarkable. Musculoskeletal: Thoracic and lower cervical spine degenerative changes. Review of the MIP images confirms the above findings. IMPRESSION: 1. Patchy ground-glass interstitial prominence in both upper lobes and micronodular interstitial prominence in both lower lobes, most compatible with an infectious pneumonitis. 2. No pulmonary emboli. 3. Mild cylindrical bronchiectasis in the right lower lobe with associated adjacent parenchymal scarring/inflammation. 4. Moderate bronchitic changes. Electronically Signed   By: Claudie Revering M.D.   On: 12/11/2018 21:11      Specialty Problems      Pulmonary Problems   Asthma    Asthma since age 68; especially severe in teen years. Responsive to intensive medical therapy. Triggers include cold air.         OSA (obstructive sleep apnea)    10/03/2014 Positive [facility] screen pre op No official testing to date      Moderate persistent asthma with exacerbation   Community acquired pneumonia   Acute respiratory failure with hypoxia ([place])   Viral pneumonia    12/12/2018-respiratory virus panel- rhinovirus  12/11/2018-CT Angio- areas of patchy groundglass interstitial prominence in both upper lobes, mild cylindrical bronchiectasis in the right lower lobe       Cylindrical bronchiectasis ([place])    12/11/2018-CT Angio- areas of patchy groundglass interstitial prominence in both upper lobes, mild cylindrical bronchiectasis in the right lower lobe         Allergies  Allergen Reactions  . Contrast Media [Iodinated Diagnostic Agents] Other (See Comments)    Redness and warm sensation, this reaction was noted on 02/27/13 during a Cardiac MRI per pt.  Pt sts he  had erythema on his head, chest, and shoulders.  Pt had a pacemaker placed August 2019 and was pre-medicated for the dye and had no allergic reaction at that time. -Carissa Mozingo B.S. RT(R)(CT)    Immunization History  Administered Date(s) Administered  . Influenza Split 08/30/2012  . Influenza, High Dose Seasonal PF 08/31/2017  . Influenza,inj,Quad PF,6+ Mos 10/09/2013, 09/12/2015  . Influenza,trivalent, recombinat, inj, PF 08/21/2018  . Influenza-Unspecified 09/29/2014, 09/02/2017  . Pneumococcal Conjugate-13 03/04/2016  . Pneumococcal Polysaccharide-23 04/12/2017  . Td 05/13/2008    Past Medical History:  Diagnosis Date  . Arthritis    "thumbs" (07/25/2018)  . Asthma   . CHF (congestive heart failure) ([place])   . Chronic bronchitis ([place])   . Environmental allergies   . GERD (gastroesophageal reflux disease)   . Headache    "seasonal; w/environmental allergies" (07/25/2018)  . History of blood transfusion    "when I had laminectomy" (07/25/2018)  . HTN (hypertension)   . Hyperlipidemia   . LBBB (left bundle branch block) 1999  . Myocardial infarction [facility])    "was told I've had an old MI; probably in the 37s" (07/25/2018)  . Pneumonia    "as a child, age 13; viral pneumonia 3 times in the last 10 years" (07/25/2018)  . Presence of permanent cardiac pacemaker 07/25/2018  . Seasonal allergies   . Sleep apnea    "wife says I do" (07/25/2018)  . Type II diabetes mellitus ([facility])     Tobacco History: Social History   Tobacco Use  Smoking Status Former Smoker  . Packs/day: 2.00  . Years: 2.00  .  Pack years: 4.00  . Types: Cigarettes  . Last attempt to quit: 12/14/1963  . Years since quitting: 55.0  Smokeless Tobacco Never Used   Counseling given: Yes  Continue to not smoke  Outpatient Encounter Medications as of 12/20/2018  Medication Sig  . ACCU-CHEK SOFTCLIX LANCETS lancets Use to test blood sugar daily as directed.  [name] albuterol (PROAIR HFA) 108 (90 BASE) MCG/ACT  inhaler Inhale 2 puffs into the lungs every 6 (six) hours as needed for wheezing or shortness of breath.   [name] albuterol (PROVENTIL) (2.5 MG/3ML) 0.083% nebulizer solution Take 3 mLs (2.5 mg total) by nebulization every 6 (six) hours as needed for wheezing or shortness of breath.  [name] aspirin 81 MG tablet Take 81 mg by mouth at bedtime.    . benzonatate (TESSALON PERLES) 100 MG capsule Take 1 capsule (100 mg total) by mouth 3 (three) times daily as needed for cough.  . Blood Glucose Calibration (ACCU-CHEK AVIVA) SOLN Use as instructed to test blood sugar ICD 10 E11 51  . Blood Glucose Monitoring Suppl (ACCU-CHEK AVIVA PLUS) W/DEVICE KIT Use as directed  . Continuous Blood Gluc Receiver (FREESTYLE LIBRE 14 DAY READER) DEVI 1 Device by Does not apply route every 14 (fourteen) days.  . Continuous Blood Gluc Sensor (FREESTYLE LIBRE 14 DAY SENSOR) MISC 2 each by Does not apply route every 14 (fourteen) days.  . famotidine (PEPCID) 20 MG tablet Take 1 tablet (20 mg total) by mouth daily.  . feeding supplement, ENSURE ENLIVE, (ENSURE ENLIVE) LIQD Take 237 mLs by mouth 2 (two) times daily between meals.  . fexofenadine (ALLEGRA) 180 MG tablet Take 180 mg by mouth daily as needed for allergies.   [name] glucose blood (ACCU-CHEK AVIVA PLUS) test strip Use as instructed to test blood sugar 6 times daily. DX Code: E11.59  . HYDROcodone-acetaminophen (NORCO) 7.5-325 MG tablet Take 1 tablet by mouth every 6 (six) hours as needed (for chronic joint pain).  [name] ibuprofen (ADVIL,MOTRIN) 800 MG tablet Take 800 mg by mouth every 8 (eight) hours as needed for headache or moderate pain.   [name] ipratropium (ATROVENT) 0.02 % nebulizer solution Take 2.5 mLs (0.5 mg total) by nebulization every 6 (six) hours.  Elmore Guise Devices (AUTOLET IMPRESSION) MISC Use as instructed to test blood sugar ICD 10 E11 51  . Lancets MISC Use to check blood sugars 6 times daily. DX Code: E11.59  . levalbuterol (XOPENEX) 1.25 MG/3ML nebulizer solution Take  1.25 mg by nebulization every 6 (six) hours.  [name] LORazepam (ATIVAN) 1 MG tablet TAKE 1/2 TO 1 TABLET BY MOUTH DAILY AS NEEDED (Patient taking differently: Take 0.5-1 mg by mouth at bedtime as needed for sleep. )  . metFORMIN (GLUCOPHAGE) 500 MG tablet TAKE 1 TABLET BY MOUTH  DAILY WITH BREAKFAST. (Patient taking differently: Take 500 mg by mouth daily with breakfast. )  . montelukast (SINGULAIR) 10 MG tablet TAKE 1 TABLET BY MOUTH  DAILY (Patient taking differently: Take 10 mg by mouth at bedtime. )  . Multiple Vitamin (MULTIVITAMIN WITH MINERALS) TABS tablet Take 1 tablet by mouth daily.  . nebivolol (BYSTOLIC) 5 MG tablet Take 1 tablet (5 mg total) by mouth daily.  . nitroGLYCERIN (NITROSTAT) 0.4 MG SL tablet Place 0.4 mg under the tongue every 5 (five) minutes x 3 doses as needed for chest pain.  . predniSONE (DELTASONE) 10 MG tablet Take 1 tablet (10 mg total) by mouth daily.  . predniSONE (DELTASONE) 10 MG tablet Take 64m daily for 3days,Take 334m  daily for 3days,Take 33m daily for 3days,Take 17mdaily for 3days, then stop  . rosuvastatin (CRESTOR) 5 MG tablet TAKE 1 TABLET BY MOUTH  EVERY MONDAY, WEDNESDAY,  AND FRIDAY (Patient taking differently: Take 5 mg by mouth every Monday, Wednesday, and Friday. )  . SYMBICORT 160-4.5 MCG/ACT inhaler INHALE 1 TO 2 PUFFS INTO  THE LUNGS EVERY 12 HOURS AS NEEDED (Patient taking differently: Inhale 2 puffs into the lungs 2 (two) times daily. )  . vardenafil (LEVITRA) 20 MG tablet TAKE 1 TABLET BY MOUTH AS DIRECTED. CAN NOT BE TAKEN WITH NITROGLYCERIN. (Patient taking differently: Take 20 mg by mouth daily as needed for erectile dysfunction (Cannot be taken with Nitroglycerin). )  . Respiratory Therapy Supplies (FLUTTER) DEVI Twice a day and prn as needed, may increase if feeling worse   No facility-administered encounter medications on file as of 12/20/2018.      Review of Systems  Review of Systems  Constitutional: Positive for activity change and  fatigue.  HENT: Positive for congestion. Negative for rhinorrhea, sinus pressure and sinus pain.   Respiratory: Positive for cough, shortness of breath and wheezing.   Cardiovascular: Negative for chest pain and palpitations.  Gastrointestinal: Negative for diarrhea, nausea and vomiting.  Psychiatric/Behavioral: Negative for dysphoric mood. The patient is not nervous/anxious.      Physical Exam  BP 118/72 (BP Location: Left Arm, Cuff Size: Normal)   Pulse (!) 104   Ht _0  (1.753 m)   Wt 177 lb (80.3 kg)   SpO2 95%   BMI 26.14 kg/m   Wt Readings from Last 5 Encounters:  12/20/18 177 lb (80.3 kg)  12/19/18 175 lb 12.8 oz (79.7 kg)  12/18/18 179 lb 12.8 oz (81.6 kg)  12/11/18 178 lb 9.2 oz (81 kg)  11/30/18 184 lb (83.5 kg)    Physical Exam  Constitutional: He is oriented to person, place, and time and well-developed, well-nourished, and in no distress. Vital signs are normal. No distress.  HENT:  Head: Normocephalic and atraumatic.  Right Ear: Hearing, tympanic membrane, external ear and ear canal normal.  Left Ear: Hearing, tympanic membrane, external ear and ear canal normal.  Nose: Nose normal. Right sinus exhibits no maxillary sinus tenderness and no frontal sinus tenderness. Left sinus exhibits no maxillary sinus tenderness and no frontal sinus tenderness.  Mouth/Throat: Uvula is midline and oropharynx is clear and moist. No oropharyngeal exudate.  Eyes: Pupils are equal, round, and reactive to light.  Neck: Normal range of motion. Neck supple.  Cardiovascular: Regular rhythm and normal heart sounds. Tachycardia present.  Pulmonary/Chest: Effort normal. No accessory muscle usage. No respiratory distress. He has no decreased breath sounds. He has wheezes (Minor right middle lobe expiratory wheeze, when patient coughed this has been cleared). He has rhonchi (Right middle and right lower lobe have moderate rhonchi and minor wheeze which clears when patient has cough).    Musculoskeletal: Normal range of motion.        General: No edema.  Lymphadenopathy:    He has no cervical adenopathy.  Neurological: He is alert and oriented to person, place, and time. Gait normal.  Skin: Skin is warm and dry. He is not diaphoretic.  Psychiatric: Mood, memory, affect and judgment normal. His mood appears not anxious. He is not agitated. He does not exhibit a depressed mood.  Nursing note and vitals reviewed.     Lab Results:  CBC    Component Value Date/Time   WBC 8.4 12/11/2018 1153  RBC 4.48 12/11/2018 1153   HGB 12.3 (L) 12/11/2018 1153   HGB 14.4 07/13/2018 1027   HCT 38.9 (L) 12/11/2018 1153   HCT 43.1 07/13/2018 1027   PLT 190 12/11/2018 1153   PLT 298 07/13/2018 1027   MCV 86.8 12/11/2018 1153   MCV 86 07/13/2018 1027   MCH 27.5 12/11/2018 1153   MCHC 31.6 12/11/2018 1153   RDW 16.0 (H) 12/11/2018 1153   RDW 14.7 07/13/2018 1027   LYMPHSABS 0.5 (L) 12/11/2018 1153   LYMPHSABS 0.9 07/13/2018 1027   MONOABS 0.4 12/11/2018 1153   EOSABS 0.3 12/11/2018 1153   EOSABS 0.2 07/13/2018 1027   BASOSABS 0.0 12/11/2018 1153   BASOSABS 0.0 07/13/2018 1027    BMET    Component Value Date/Time   NA 138 12/12/2018 0315   NA 138 07/13/2018 1027   K 4.0 12/12/2018 0315   CL 101 12/12/2018 0315   CO2 25 12/12/2018 0315   GLUCOSE 179 (H) 12/12/2018 0315   BUN 24 (H) 12/12/2018 0315   BUN 19 07/13/2018 1027   CREATININE 1.24 12/12/2018 0315   CALCIUM 8.7 (L) 12/12/2018 0315   GFRNONAA 57 (L) 12/12/2018 0315   GFRAA >60 12/12/2018 0315    BNP    Component Value Date/Time   BNP 79.8 12/11/2018 1127    ProBNP No results found for: PROBNP    Assessment & Plan:     Viral pneumonia Assessment: 78 year old male patient slowly improving from viral pneumonia Lungs rhonchorous in right middle and base this improves when patient coughs Patient believes that he is clinically improving but still has a ways to go to get back to baseline Patient  currently on prednisone has 3 days left Patient using incentive spirometer at home averaging between 12 100-20 500 Patient using Saba nebs every 6  Plan: Reschedule pulmonary function test in 6 weeks High-res CT in 6 weeks to follow viral pneumonia as well as to further evaluate cylindrical bronchiectasis seen on CT Angio Continue to practice good handwashing Started patient with flutter valve today Patient to continue Mucinex daily Patient can continue incentive spirometer use Patient can continue use Saba nebs every 6 Patient can present to our office sooner if symptoms are not improving or he feels that they are starting to worsen  Asthma Plan: We will further evaluate with pulmonary function testing in 6 weeks Continue Symbicort 160 Can continue to use Saba nebs every 6 hours as needed  Cylindrical bronchiectasis [facility]) Assessment: December/2019 CT angios showing mild cylindrical bronchiectasis in right lower lobe  Plan: We will start patient with flutter valve today Patient need to continue using incentive spirometer We will reevaluate bronchiectasis with high-res CT in 6 weeks Patient to continue eat whole nutritious diet Patient to follow-up with our office sooner if symptoms worsen, or sputum color changes, or patient has concerns with his respiratory status     Lauraine Rinne, NP 12/20/2018   This appointment was 42 minutes along with over 50% of the time in direct face-to-face patient care, assessment, plan of care, and follow-up.

## 2018-12-20 NOTE — Assessment & Plan Note (Signed)
Assessment: 78 year old male patient slowly improving from viral pneumonia Lungs rhonchorous in right middle and base this improves when patient coughs Patient believes that he is clinically improving but still has a ways to go to get back to baseline Patient currently on prednisone has 3 days left Patient using incentive spirometer at home averaging between 12 100-20 500 Patient using Saba nebs every 6  Plan: Reschedule pulmonary function test in 6 weeks High-res CT in 6 weeks to follow viral pneumonia as well as to further evaluate cylindrical bronchiectasis seen on CT Angio Continue to practice good handwashing Started patient with flutter valve today Patient to continue Mucinex daily Patient can continue incentive spirometer use Patient can continue use Saba nebs every 6 Patient can present to our office sooner if symptoms are not improving or he feels that they are starting to worsen

## 2018-12-20 NOTE — Assessment & Plan Note (Signed)
Assessment: December/2019 CT angios showing mild cylindrical bronchiectasis in right lower lobe  Plan: We will start patient with flutter valve today Patient need to continue using incentive spirometer We will reevaluate bronchiectasis with high-res CT in 6 weeks Patient to continue eat whole nutritious diet Patient to follow-up with our office sooner if symptoms worsen, or sputum color changes, or patient has concerns with his respiratory status

## 2018-12-20 NOTE — Patient Instructions (Signed)
PFTs in 6 weeks   Continue Symbicort 160 >>> 2 puffs in the morning right when you wake up, rinse out your mouth after use, 12 hours later 2 puffs, rinse after use >>> Take this daily, no matter what >>> This is not a rescue inhaler   Continue to Xopenex every 6 hours as needed for sob / wheezing   Bronchiectasis: This is the medical term which indicates that you have damage, dilated airways making you more susceptible to respiratory infection. Use a flutter valve 10 breaths twice a day or 4 to 5 breaths 4-5 times a day to help clear mucus out Let us know if you have cough with change in mucus color or fevers or chills.  At that point you would need an antibiotic. Maintain a healthy nutritious diet, eating whole foods Take your medications as prescribed   High Res CT  In 6 weeks   Follow up after CT and PFTs complete >>> Follow up with Dr. Valeta Harms (if not availble then - Wyn Quaker FNP)  It is flu season:   >>>Remember to be washing your hands regularly, using hand sanitizer, be careful to use around herself with has contact with people who are sick will increase her chances of getting sick yourself.  >>> Best ways to protect herself from the flu: Receive the yearly flu vaccine, practice good hand hygiene washing with soap and also using hand sanitizer when available, eat a nutritious meals, get adequate rest, hydrate appropriately   Please contact the office if your symptoms worsen or you have concerns that you are not improving.   Thank you for choosing [facility] for your healthcare, and for allowing [place] to partner with you on your healthcare journey. I am thankful to be able to provide care to you today.   Wyn Quaker FNP-C

## 2018-12-20 NOTE — Assessment & Plan Note (Addendum)
Plan: We will further evaluate with pulmonary function testing in 6 weeks Continue Symbicort 160 Can continue to use Saba nebs every 6 hours as needed Patient to finish scheduled prednisone taper Do not believe patient needs additional prednisone or steroids at this time especially in setting of patient's diabetes management and a regular blood sugars with prednisone Continue Singulair We will refill your respiratory medications today

## 2018-12-21 NOTE — Progress Notes (Signed)
PCCM: Agree. Thanks for seeing him. Will follow up on the CT results. Please forward them too me to take a look when they come back if anything needs my attention.  Garner Nash, DO [facility] 12/21/2018 7:26 PM

## 2018-12-21 NOTE — Progress Notes (Signed)
Faythe Ghee will do.   Aaron Edelman

## 2018-12-25 ENCOUNTER — Ambulatory Visit: Payer: Medicare Other | Admitting: Neurology

## 2018-12-25 ENCOUNTER — Encounter: Payer: Self-pay | Admitting: Neurology

## 2018-12-25 VITALS — BP 120/78 | HR 94 | Ht 69.0 in | Wt 175.0 lb

## 2018-12-25 DIAGNOSIS — E1142 Type 2 diabetes mellitus with diabetic polyneuropathy: Secondary | ICD-10-CM | POA: Insufficient documentation

## 2018-12-25 DIAGNOSIS — G5732 Lesion of lateral popliteal nerve, left lower limb: Secondary | ICD-10-CM | POA: Diagnosis not present

## 2018-12-25 NOTE — Patient Instructions (Addendum)
NCS/EMG of the left > right leg  Referral to [employer] for left AFO  Do not cross your legs  ELECTROMYOGRAM AND NERVE CONDUCTION STUDIES (EMG/NCS) INSTRUCTIONS  How to Prepare The neurologist conducting the EMG will need to know if you have certain medical conditions. Tell the neurologist and other EMG lab personnel if you: . Have a pacemaker or any other electrical medical device . Take blood-thinning medications . Have hemophilia, a blood-clotting disorder that causes prolonged bleeding Bathing Take a shower or bath shortly before your exam in order to remove oils from your skin. Don't apply lotions or creams before the exam.  What to Expect You'll likely be asked to change into a hospital gown for the procedure and lie down on an examination table. The following explanations can help you understand what will happen during the exam.  . Electrodes. The neurologist or a technician places surface electrodes at various locations on your skin depending on where you're experiencing symptoms. Or the neurologist may insert needle electrodes at different sites depending on your symptoms.  . Sensations. The electrodes will at times transmit a tiny electrical current that you may feel as a twinge or spasm. The needle electrode may cause discomfort or pain that usually ends shortly after the needle is removed. If you are concerned about discomfort or pain, you may want to talk to the neurologist about taking a short break during the exam.  . Instructions. During the needle EMG, the neurologist will assess whether there is any spontaneous electrical activity when the muscle is at rest - activity that isn't present in healthy muscle tissue - and the degree of activity when you slightly contract the muscle.  He or she will give you instructions on resting and contracting a muscle at appropriate times. Depending on what muscles and nerves the neurologist is examining, he or she may ask you to change positions  during the exam.  After your EMG You may experience some temporary, minor bruising where the needle electrode was inserted into your muscle. This bruising should fade within several days. If it persists, contact your primary care doctor.

## 2018-12-25 NOTE — Progress Notes (Signed)
[facility] Clinic Note - Initial Visit   Date: 12/25/18  Jeffery [name] MRN: [id number] DOB: [birth year]   Dear Dr.  Quay Burow:   Thank you for your kind referral of Jeffery [name] for consultation of left foot drop. Although his history is well known to you, please allow [place] to reiterate it for the purpose of our medical record. The patient was accompanied to the clinic by self.   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  The patient has been having upper respiratory infection and pneumonia since October when completed 3 courses of antibiotics and steroids.  He was prescribed Levaquin and 1 week following the course (mid-December), he was walking in [employer] and noticed that his left foot was dragging.  Symptoms occurred suddenly and without pain.  He is unable to dorsiflex his left foot or extend his toes.  There has been no worsening or improvement since onset.  There was no associated pain, low back discomfort.  He has not suffered any falls. He does not have left arm weakness, left facial weakness.  Upon observing the patient during the interview, I noticed that he almost constantly prefers to cross his legs.  With his upper respiratory infection, he has loss some weight and has generalized weakness.  He had tingling and numbness over the toes of both feet for the past 2 months.  He states that his diabetes was much better controlled prior to taking prednisone, which has caused hyperglycemia.  He drinks alcohol socially, about once per week.   Out-side paper records, electronic medical record, and images have been reviewed where available and summarized as:  Lab Results  Component Value Date   TSH 1.47 09/27/2014   Lab Results  Component Value Date   HGBA1C 8.1 (H) 12/12/2018     Past Medical History:  Diagnosis Date  . Arthritis    "thumbs" (07/25/2018)  . Asthma   . CHF (congestive heart failure) ([place])   . Chronic bronchitis ([place])   . Environmental allergies   . GERD (gastroesophageal reflux disease)   . Headache    "seasonal; w/environmental allergies" (07/25/2018)  . History of blood transfusion    "when I had laminectomy" (07/25/2018)  . HTN (hypertension)   . Hyperlipidemia   . LBBB (left bundle branch block) 1999  . Myocardial infarction [facility])    "was told I've had an old MI; probably in the 31s" (07/25/2018)  . Pneumonia    "as a child, age 83; viral pneumonia 3 times in the last 10 years" (07/25/2018)  . Presence of permanent cardiac pacemaker 07/25/2018  . Seasonal allergies   . Sleep apnea    "wife says I do" (07/25/2018)  . Type II diabetes mellitus ([place])     Past Surgical History:  Procedure Laterality Date  . BACK SURGERY    . BIV PACEMAKER INSERTION CRT-P  07/25/2018  . BIV PACEMAKER INSERTION CRT-P N/A 07/25/2018   Procedure: BIV PACEMAKER INSERTION CRT-P;  Surgeon: Evans Lance, MD;  Location: [facility];  Service: Cardiovascular;  Laterality: N/A;  . CARDIAC CATHETERIZATION  2003   Dr Pernell Dupre; 85 % R circumflex obstruction  . CARPAL TUNNEL RELEASE Left 10/11/2014   Procedure: LEFT CARPAL TUNNEL RELEASE;  Surgeon: Roseanne Kaufman, MD;  Location: [facility];  Service:  Orthopedics;  Laterality: Left;  . COLONOSCOPY W/ BIOPSIES AND POLYPECTOMY  2018  . INGUINAL HERNIA REPAIR Right 1948  . INGUINAL HERNIA REPAIR Left 1988  . LUMBAR LAMINECTOMY  1984  . MINOR CARPAL TUNNEL Right 11/22/2014   Procedure: RIGHT LIMITED OPEN CARPAL TUNNEL RELEASE;  Surgeon: Roseanne Kaufman, MD;  Location: [facility];  Service: Orthopedics;  Laterality: Right;  . TONSILLECTOMY  1958     Medications:  Outpatient Encounter Medications as of 12/25/2018  Medication Sig  . ACCU-CHEK  SOFTCLIX LANCETS lancets Use to test blood sugar daily as directed.  [name] albuterol (PROAIR HFA) 108 (90 BASE) MCG/ACT inhaler Inhale 2 puffs into the lungs every 6 (six) hours as needed for wheezing or shortness of breath.   [name] albuterol (PROVENTIL) (2.5 MG/3ML) 0.083% nebulizer solution Take 3 mLs (2.5 mg total) by nebulization every 6 (six) hours as needed for wheezing or shortness of breath.  [name] aspirin 81 MG tablet Take 81 mg by mouth at bedtime.    . Blood Glucose Calibration (ACCU-CHEK AVIVA) SOLN Use as instructed to test blood sugar ICD 10 E11 51  . Blood Glucose Monitoring Suppl (ACCU-CHEK AVIVA PLUS) W/DEVICE KIT Use as directed  . Continuous Blood Gluc Receiver (FREESTYLE LIBRE 14 DAY READER) DEVI 1 Device by Does not apply route every 14 (fourteen) days.  . Continuous Blood Gluc Sensor (FREESTYLE LIBRE 14 DAY SENSOR) MISC 2 each by Does not apply route every 14 (fourteen) days.  . famotidine (PEPCID) 20 MG tablet Take 1 tablet (20 mg total) by mouth daily.  . feeding supplement, ENSURE ENLIVE, (ENSURE ENLIVE) LIQD Take 237 mLs by mouth 2 (two) times daily between meals.  . fexofenadine (ALLEGRA) 180 MG tablet Take 180 mg by mouth daily as needed for allergies.   [name] glucose blood (ACCU-CHEK AVIVA PLUS) test strip Use as instructed to test blood sugar 6 times daily. DX Code: E11.59  . HYDROcodone-acetaminophen (NORCO) 7.5-325 MG tablet Take 1 tablet by mouth every 6 (six) hours as needed (for chronic joint pain).  [name] ibuprofen (ADVIL,MOTRIN) 800 MG tablet Take 800 mg by mouth every 8 (eight) hours as needed for headache or moderate pain.   [name] ipratropium (ATROVENT) 0.02 % nebulizer solution Take 2.5 mLs (0.5 mg total) by nebulization every 6 (six) hours.  Elmore Guise Devices (AUTOLET IMPRESSION) MISC Use as instructed to test blood sugar ICD 10 E11 51  . Lancets MISC Use to check blood sugars 6 times daily. DX Code: E11.59  . levalbuterol (XOPENEX) 1.25 MG/3ML nebulizer solution Take 1.25 mg by  nebulization every 6 (six) hours.  [name] LORazepam (ATIVAN) 1 MG tablet TAKE 1/2 TO 1 TABLET BY MOUTH DAILY AS NEEDED (Patient taking differently: Take 0.5-1 mg by mouth at bedtime as needed for sleep. )  . metFORMIN (GLUCOPHAGE) 500 MG tablet TAKE 1 TABLET BY MOUTH  DAILY WITH BREAKFAST. (Patient taking differently: Take 500 mg by mouth daily with breakfast. )  . montelukast (SINGULAIR) 10 MG tablet TAKE 1 TABLET BY MOUTH  DAILY (Patient taking differently: Take 10 mg by mouth at bedtime. )  . Multiple Vitamin (MULTIVITAMIN WITH MINERALS) TABS tablet Take 1 tablet by mouth daily.  . nebivolol (BYSTOLIC) 5 MG tablet Take 1 tablet (5 mg total) by mouth daily.  . nitroGLYCERIN (NITROSTAT) 0.4 MG SL tablet Place 0.4 mg under the tongue every 5 (five) minutes x 3 doses as needed for chest pain.  [name] Respiratory Therapy Supplies (FLUTTER) DEVI Twice a day and prn  as needed, may increase if feeling worse  . rosuvastatin (CRESTOR) 5 MG tablet TAKE 1 TABLET BY MOUTH  EVERY MONDAY, WEDNESDAY,  AND FRIDAY (Patient taking differently: Take 5 mg by mouth every Monday, Wednesday, and Friday. )  . SYMBICORT 160-4.5 MCG/ACT inhaler INHALE 1 TO 2 PUFFS INTO  THE LUNGS EVERY 12 HOURS AS NEEDED (Patient taking differently: Inhale 2 puffs into the lungs 2 (two) times daily. )  . vardenafil (LEVITRA) 20 MG tablet TAKE 1 TABLET BY MOUTH AS DIRECTED. CAN NOT BE TAKEN WITH NITROGLYCERIN. (Patient taking differently: Take 20 mg by mouth daily as needed for erectile dysfunction (Cannot be taken with Nitroglycerin). )  . [DISCONTINUED] benzonatate (TESSALON PERLES) 100 MG capsule Take 1 capsule (100 mg total) by mouth 3 (three) times daily as needed for cough.  . [DISCONTINUED] predniSONE (DELTASONE) 10 MG tablet Take 1 tablet (10 mg total) by mouth daily.  . [DISCONTINUED] predniSONE (DELTASONE) 10 MG tablet Take 36m daily for 3days,Take 359mdaily for 3days,Take 2038maily for 3days,Take 15m66mily for 3days, then stop   No  facility-administered encounter medications on file as of 12/25/2018.      Allergies:  Allergies  Allergen Reactions  . Contrast Media [Iodinated Diagnostic Agents] Other (See Comments)    Redness and warm sensation, this reaction was noted on 02/27/13 during a Cardiac MRI per pt.  Pt sts he had erythema on his head, chest, and shoulders.  Pt had a pacemaker placed August 2019 and was pre-medicated for the dye and had no allergic reaction at that time. -Carissa Mozingo B.S. RT(R)(CT)    Family History: Family History  Problem Relation Age of Onset  . Heart attack Father        80s 96sColon polyps Father   . Heart failure Mother        90s  . Subarachnoid hemorrhage Mother 49  40Hypertension Mother   . Subarachnoid hemorrhage Paternal Grandfather 76  48Diabetes Maternal Aunt   . Colon cancer Neg Hx     Social History: Social History   Tobacco Use  . Smoking status: Former Smoker    Packs/day: 2.00    Years: 2.00    Pack years: 4.00    Types: Cigarettes    Last attempt to quit: 12/14/1963    Years since quitting: 55.0  . Smokeless tobacco: Never Used  Substance Use Topics  . Alcohol use: Not Currently    Alcohol/week: 2.0 standard drinks    Types: 2 Glasses of wine per week    Comment: occasional  . Drug use: Never   Social History   Social History Narrative   Lives with wife in a 2 story home.  Has 3 daughters.  Retired nursMusic therapistm [employer]   Review of Systems:  CONSTITUTIONAL: No fevers, chills, night sweats, +weight loss.   EYES: No visual changes or eye pain ENT: No hearing changes.  No history of nose bleeds.   RESPIRATORY: +cough, + wheezing no shortness of breath.   CARDIOVASCULAR: Negative for chest pain, and palpitations.   GI: Negative for abdominal discomfort, blood in stools or black stools.  No recent change in bowel habits.   GU:  No history of incontinence.   MUSCLOSKELETAL: +history of joint pain or swelling.  No myalgias.   SKIN:  Negative for lesions, rash, and itching.   HEMATOLOGY/ONCOLOGY: Negative for prolonged bleeding, bruising easily, and swollen nodes.  No history of cancer.   ENDOCRINE: Negative  for cold or heat intolerance, polydipsia or goiter.   PSYCH:  No depression or anxiety symptoms.   NEURO: As Above.   Vital Signs:  BP 120/78   Pulse 94   Ht 5' 9"  (1.753 m)   Wt 175 lb (79.4 kg)   SpO2 96%   BMI 25.84 kg/m    General Medical Exam:   General:  Well appearing, comfortable, frequently crossing the legs.   Eyes/ENT: see cranial nerve examination.   Neck: No masses appreciated.  Full range of motion without tenderness.  No carotid bruits. Respiratory:  Clear to auscultation, good air entry bilaterally.   Cardiac:  Regular rate and rhythm, no murmur.   Extremities:  No deformities, edema, or skin discoloration.  Skin:  No rashes or lesions.  Neurological Exam: MENTAL STATUS including orientation to time, place, person, recent and remote memory, attention span and concentration, language, and fund of knowledge is normal.  Speech is not dysarthric.  CRANIAL NERVES: II:  No visual field defects.  Unremarkable fundi.   III-IV-VI: Pupils equal round and reactive to light.  Disconjugate gaze with left eye deviating to the left at rest, extraocular muscles intact bilaterally. No nystagmus.  No ptosis.   V:  Normal facial sensation.     VII:  Normal facial symmetry and movements.  No pathologic facial reflexes.  VIII:  Normal hearing and vestibular function.   IX-X:  Normal palatal movement.   XI:  Normal shoulder shrug and head rotation.   XII:  Normal tongue strength and range of motion, no deviation or fasciculation.  MOTOR:  Mild left TA atrophy, no fasciculations or abnormal movements.  No pronator drift.  Tone is normal.    Right Upper Extremity:    Left Upper Extremity:    Deltoid  5/5   Deltoid  5/5   Biceps  5/5   Biceps  5/5   Triceps  5/5   Triceps  5/5   Wrist extensors  5/5   Wrist  extensors  5/5   Wrist flexors  5/5   Wrist flexors  5/5   Finger extensors  5/5   Finger extensors  5/5   Finger flexors  5/5   Finger flexors  5/5   Dorsal interossei  5/5   Dorsal interossei  5/5   Abductor pollicis  5/5   Abductor pollicis  5/5   Tone (Ashworth scale)  0  Tone (Ashworth scale)  0   Right Lower Extremity:    Left Lower Extremity:    Hip flexors  5/5   Hip flexors  5/5   Hip extensors  5/5   Hip extensors  5/5   Knee flexors  5/5   Knee flexors  5/5   Knee extensors  5/5   Knee extensors  5/5   Dorsiflexors  5/5   Dorsiflexors  1/5   Plantarflexors  5/5   Plantarflexors  5/5   Inversion 5/5  Inversion 5/5  Eversion 5/5  Eversion 1/5  Toe extensors  5/5   Toe extensors  1/5   Toe flexors  5/5   Toe flexors  5/5   Tone (Ashworth scale)  0  Tone (Ashworth scale)  0   MSRs:  Right  Left brachioradialis 2+  brachioradialis 2+  biceps 2+  biceps 2+  triceps 2+  triceps 2+  patellar 1+  patellar 2+  ankle jerk 0  ankle jerk 0  Hoffman no  Hoffman no  plantar response down  plantar response down   SENSORY: Vibration is reduced to 10% at the great toe bilaterally, and 90% at the ankles.  Vibration is normal at the knees and at the hands.  Temperature is reduced over the dorsum of the feet bilaterally.  Pinprick and light touch is intact throughout.  Romberg sign is mildly positive.    COORDINATION/GAIT: Normal finger-to- nose-finger.  Finger tapping is intact, right foot tapping is slowed due to weakness.  High steppage gait on the left, overall stable.  He is able to stand on toes bilaterally, unable to stand on heels on the left.   IMPRESSION: 1.  Left foot drop due to suspected left peroneal mononeuropathy at the fibular head, due to localized compression on the nerve from frequent crossing of the legs. Weakness very clearly involves peroneal-innervated muscles.  Doubt L5 radiculopathy due to normal foot  inversion and lack of radicular symptoms.   Proceed with NCS/EMG of the left > right lower extremities to better localize symptoms.  Schedule at least 3-4 weeks after symptom onset.  Referral to [facility] for left AFO Avoid crossing the legs  2.  Distal and symmetric diabetic polyneuropathy affecting the feet.  Encouraged to try to be strict with diet and lifestyle changes.  Understandably, he has been on corticosteroids which can increase his blood sugars and hopefully now that his treatments are completed for URI, blood sugars can be better controlled.  Return to clinic after testing   Thank you for allowing me to participate in patient's care.  If I can answer any additional questions, I would be pleased to do so.    Sincerely,     K. Posey Pronto, DO

## 2018-12-28 ENCOUNTER — Other Ambulatory Visit: Payer: Self-pay | Admitting: Internal Medicine

## 2018-12-28 ENCOUNTER — Encounter: Payer: Medicare Other | Admitting: Neurology

## 2019-01-03 ENCOUNTER — Other Ambulatory Visit: Payer: Medicare Other

## 2019-01-08 ENCOUNTER — Telehealth: Payer: Self-pay | Admitting: Internal Medicine

## 2019-01-08 MED ORDER — OSELTAMIVIR PHOSPHATE 75 MG PO CAPS: 75.0000 mg | ORAL_CAPSULE | Freq: Every day | ORAL | 0 refills | Status: DC

## 2019-01-08 NOTE — Telephone Encounter (Signed)
Copied from [place] [id number]. Topic: Quick Communication - Rx Refill/Question >> Jan 08, 2019 12:44 PM Scherrie Gerlach wrote: Medication: tami flu Pt states he has just finished being very ill for the past 3 months, and now has been exposed to the flu type A. Pt's grandson has gotten this.  Since his situation is a little different, he wants to know if Dr Quay Burow wants him to take precaution and take something.  Pt does not have any sx at this time.  [facility] [id number] [place] [address], [place] [address] [phone] (Phone) [phone] (Fax)

## 2019-01-08 NOTE — Telephone Encounter (Signed)
tamiflu sent to [facility]

## 2019-01-08 NOTE — Telephone Encounter (Signed)
Pt aware.

## 2019-01-09 ENCOUNTER — Other Ambulatory Visit: Payer: Self-pay | Admitting: Family

## 2019-01-09 ENCOUNTER — Encounter: Payer: Medicare Other | Admitting: Neurology

## 2019-01-09 ENCOUNTER — Other Ambulatory Visit: Payer: Self-pay | Admitting: Internal Medicine

## 2019-01-09 MED ORDER — HYDROCODONE-ACETAMINOPHEN 7.5-325 MG PO TABS: 1.0000 | ORAL_TABLET | Freq: Four times a day (QID) | ORAL | 0 refills | Status: DC | PRN

## 2019-01-09 MED ORDER — LORAZEPAM 1 MG PO TABS: ORAL_TABLET | ORAL | 0 refills | Status: DC

## 2019-01-09 NOTE — Telephone Encounter (Signed)
Last refill was 12/11/18

## 2019-01-09 NOTE — Telephone Encounter (Signed)
Last refill was 12/08/18

## 2019-01-11 ENCOUNTER — Ambulatory Visit (INDEPENDENT_AMBULATORY_CARE_PROVIDER_SITE_OTHER): Payer: Medicare Other | Admitting: Neurology

## 2019-01-11 DIAGNOSIS — G610 Guillain-Barre syndrome: Secondary | ICD-10-CM

## 2019-01-11 DIAGNOSIS — E1142 Type 2 diabetes mellitus with diabetic polyneuropathy: Secondary | ICD-10-CM

## 2019-01-11 DIAGNOSIS — G5732 Lesion of lateral popliteal nerve, left lower limb: Secondary | ICD-10-CM

## 2019-01-15 NOTE — Procedures (Signed)
[facility]  [address], [address]  [place],  [address] Tel: [phone] Fax:  [phone] Test Date:  01/11/2019  Patient: Jeffery [name] DOB: 10/19/1941 Physician: Narda Amber, DO  Sex: Male Height: 5\' 9"  Ref Phys: Narda Amber, DO  ID#: [id number] Temp: 36.0 Technician:    Patient Complaints: This is a 78 year old man with diabetes mellitus and history of lumbar surgery is referred for evaluation of left foot drop and bilateral feet numbness.  NCV & EMG Findings: Extensive electrodiagnostic testing of the left lower extremity and additional studies of the right shows:  1. Bilateral superficial peroneal and right sural sensory responses are absent.  Left sural sensory response shows borderline-normal amplitude and prolonged latency (4.8 ms). 2. Bilateral peroneal motor responses at the extensor digitorum brevis show reduced amplitude which is worse on the left, additionally, there is conduction velocity slowing on the right.  Left peroneal motor response shows prolonged latency, severely reduced amplitude and mild conduction velocity slowing.  Right peroneal motor response is borderline-normal, with conduction block and mild conduction velocity slowing across the fibular head.  Bilateral tibial motor responses are within normal limits. 3. Bilateral tibial H reflex studies show prolonged latencies. 4. Severe active on chronic motor axonal loss changes are seen affecting the peroneal innervated muscles below the knee, which is worse on the left.  There is also fibrillation potentials seen in the right medial gastrocnemius muscle.    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   ___________________________ Narda Amber, DO    Nerve Conduction Studies Anti Sensory Summary Table   Site NR Peak (ms) Norm Peak  (ms) P-T Amp (V) Norm P-T Amp  Left Sup Peroneal Anti Sensory (Ant Lat Mall)  36C  12 cm NR  <4.6  >3  Right Sup Peroneal Anti Sensory (Ant Lat Mall)  36C  12 cm NR  <4.6  >3  Left Sural Anti Sensory (Lat Mall)  36C  Calf    4.8 <4.6 3.6 >3  Right Sural Anti Sensory (Lat Mall)  36C  Calf NR  <4.6  >3   Motor Summary Table   Site NR Onset (ms) Norm Onset (ms) O-P Amp (mV) Norm O-P Amp Site1 Site2 Delta-0 (ms) Dist (cm) Vel (m/s) Norm Vel (m/s)  Left Peroneal Motor (Ext Dig Brev)  36C  Ankle    4.0 <6.0 0.9 >2.5 B Fib Ankle  0.0  >40  B Fib NR     Poplt B Fib  0.0  >40  Poplt NR            Right Peroneal Motor (Ext Dig Brev)  36C  Ankle    4.3 <6.0 1.5 >2.5 B Fib Ankle 10.1 38.0 38 >40  B Fib    14.4  1.4  Poplt B Fib 2.5 8.0 32 >40  Poplt    16.9  1.3         Left Peroneal TA Motor (Tib Ant)  36C  Fib Head    5.0 <4.5 0.4 >3 Poplit Fib Head 2.0 7.0 35 >40  Poplit    7.0  0.1         Right Peroneal TA Motor (Tib Ant)  36C  Fib Head    3.2 <4.5 3.0 >3 Poplit Fib Head 2.1 8.0 38 >40  Poplit    5.3  2.2  Left Tibial Motor (Abd Hall Brev)  36C  Ankle    4.1 <6.0 5.6 >4 Knee Ankle 9.6 39.0 41 >40  Knee    13.7  2.9         Right Tibial Motor (Abd Hall Brev)  36C  Ankle    4.6 <6.0 5.0 >4 Knee Ankle 11.1 44.0 40 >40  Knee    15.7  2.4          H Reflex Studies   NR H-Lat (ms) Lat Norm (ms) L-R H-Lat (ms)  Left Tibial (Gastroc)  36C     37.01 <35 0.00  Right Tibial (Gastroc)  36C     37.01 <35 0.00   EMG   Side Muscle Ins Act Fibs Psw Fasc Number Recrt Dur Dur. Amp Amp. Poly Poly. Comment  Left AntTibialis Nml 2+ Nml Nml NE None - - - - - - N/A  Left Gastroc Nml Nml Nml Nml Nml Nml Nml Nml Nml Nml Nml Nml N/A  Left Flex Dig Long Nml Nml Nml Nml Nml Nml Nml Nml Nml Nml Nml Nml N/A  Left ExtHallLong Nml 1+ Nml Nml SMU Rapid All 1+ Nml Nml All 1+ N/A  Left Fibularis Long Nml 2+ Nml Nml SMU Rapid Nml Nml Nml Nml Nml Nml N/A  Left RectFemoris Nml Nml Nml Nml  Nml Nml Nml Nml Nml Nml Nml Nml N/A  Left GluteusMed Nml Nml Nml Nml Nml Nml Nml Nml Nml Nml Nml Nml N/A  Left BicepsFemS Nml Nml Nml Nml Nml Nml Nml Nml Nml Nml Nml Nml N/A  Right AntTibialis Nml 2+ Nml Nml 2- Rapid Some 1+ Some 1+ Nml Nml N/A  Right Gastroc Nml 1+ Nml Nml Nml Nml Nml Nml Nml Nml Nml Nml N/A  Right Flex Dig Long Nml Nml Nml Nml Nml Nml Nml Nml Nml Nml Nml Nml N/A  Right RectFemoris Nml Nml Nml Nml Nml Nml Nml Nml Nml Nml Nml Nml N/A  Right GluteusMed Nml Nml Nml Nml Nml Nml Nml Nml Nml Nml Nml Nml N/A  Right ExtHallLong Nml 1+ Nml Nml 3- Rapid Many 1+ Many 1+ Nml Nml N/A  Right Fibularis Long Nml Nml Nml Nml 2- Rapid Some 1+ Some 1+ Nml Nml N/A  Right BicepsFemS Nml Nml Nml Nml Nml Nml Nml Nml Nml Nml Nml Nml N/A      Waveforms:

## 2019-01-19 ENCOUNTER — Other Ambulatory Visit (INDEPENDENT_AMBULATORY_CARE_PROVIDER_SITE_OTHER): Payer: Medicare Other

## 2019-01-19 ENCOUNTER — Other Ambulatory Visit: Payer: Self-pay | Admitting: *Deleted

## 2019-01-19 ENCOUNTER — Telehealth: Payer: Self-pay | Admitting: *Deleted

## 2019-01-19 DIAGNOSIS — R6889 Other general symptoms and signs: Secondary | ICD-10-CM

## 2019-01-19 LAB — HEPATIC FUNCTION PANEL
ALT: 13 U/L (ref 0–53)
AST: 16 U/L (ref 0–37)
Albumin: 3.7 g/dL (ref 3.5–5.2)
Alkaline Phosphatase: 67 U/L (ref 39–117)
BILIRUBIN DIRECT: 0 mg/dL (ref 0.0–0.3)
Total Bilirubin: 0.3 mg/dL (ref 0.2–1.2)
Total Protein: 6.7 g/dL (ref 6.0–8.3)

## 2019-01-19 LAB — SEDIMENTATION RATE: Sed Rate: 40 mm/hr — ABNORMAL HIGH (ref 0–20)

## 2019-01-19 LAB — TSH: TSH: 0.81 u[IU]/mL (ref 0.35–4.50)

## 2019-01-19 LAB — C-REACTIVE PROTEIN: CRP: 4.6 mg/dL (ref 0.5–20.0)

## 2019-01-19 NOTE — Telephone Encounter (Signed)
-----  Message from Alda Berthold, DO sent at 01/18/2019  3:43 PM EST ----- Please order ESR, CRP, vitamin B12, vitamin B1, copper, SPEP with IFE, TSH, ANA, hepatitis panel, c/p-ANCA, and heavy metal screen. Thanks.

## 2019-01-19 NOTE — Telephone Encounter (Signed)
Patient in for labs today.

## 2019-01-22 LAB — ANA: Anti Nuclear Antibody(ANA): NEGATIVE

## 2019-01-24 LAB — ANCA SCREEN W REFLEX TITER: ANCA SCREEN: NEGATIVE

## 2019-01-24 LAB — PROTEIN ELECTROPHORESIS, SERUM
Albumin ELP: 3.3 g/dL — ABNORMAL LOW (ref 3.8–4.8)
Alpha 1: 0.4 g/dL — ABNORMAL HIGH (ref 0.2–0.3)
Alpha 2: 1 g/dL — ABNORMAL HIGH (ref 0.5–0.9)
BETA GLOBULIN: 0.5 g/dL (ref 0.4–0.6)
Beta 2: 0.3 g/dL (ref 0.2–0.5)
Gamma Globulin: 0.8 g/dL (ref 0.8–1.7)
Total Protein: 6.4 g/dL (ref 6.1–8.1)

## 2019-01-24 LAB — IMMUNOFIXATION ELECTROPHORESIS
IgG (Immunoglobin G), Serum: 813 mg/dL (ref 600–1540)
IgM, Serum: 130 mg/dL (ref 50–300)
Immunoglobulin A: 221 mg/dL (ref 70–320)

## 2019-01-24 LAB — COPPER, SERUM: Copper: 161 ug/dL (ref 70–175)

## 2019-01-24 LAB — HEAVY METALS PANEL, BLOOD
Arsenic: <10 mcg/L (ref <23)
Lead: 1 ug/dL (ref <5)
Mercury, B: <5 mcg/L (ref 0–10)

## 2019-01-24 LAB — VITAMIN B1: Vitamin B1 (Thiamine): 14 nmol/L (ref 8–30)

## 2019-01-24 LAB — VITAMIN B12: Vitamin B-12: 510 pg/mL (ref 200–1100)

## 2019-01-26 ENCOUNTER — Telehealth: Payer: Self-pay | Admitting: *Deleted

## 2019-01-26 ENCOUNTER — Ambulatory Visit (INDEPENDENT_AMBULATORY_CARE_PROVIDER_SITE_OTHER)
Admission: RE | Admit: 2019-01-26 | Discharge: 2019-01-26 | Disposition: A | Discharge status: Home / Self Care | Payer: Medicare Other | Source: Ambulatory Visit | Attending: Pulmonary Disease | Admitting: Pulmonary Disease

## 2019-01-26 ENCOUNTER — Other Ambulatory Visit: Payer: Self-pay | Admitting: *Deleted

## 2019-01-26 DIAGNOSIS — G5732 Lesion of lateral popliteal nerve, left lower limb: Secondary | ICD-10-CM

## 2019-01-26 DIAGNOSIS — R918 Other nonspecific abnormal finding of lung field: Secondary | ICD-10-CM | POA: Diagnosis not present

## 2019-01-26 DIAGNOSIS — G610 Guillain-Barre syndrome: Secondary | ICD-10-CM

## 2019-01-26 DIAGNOSIS — J471 Bronchiectasis with (acute) exacerbation: Secondary | ICD-10-CM | POA: Diagnosis not present

## 2019-01-26 DIAGNOSIS — J479 Bronchiectasis, uncomplicated: Secondary | ICD-10-CM | POA: Diagnosis not present

## 2019-01-26 DIAGNOSIS — R0602 Shortness of breath: Secondary | ICD-10-CM

## 2019-01-26 NOTE — Progress Notes (Signed)
Reviewed your high-resolution CT scan which shows stable bronchiectasis.  This is known.  Continue flutter valve use.  This was something that we discussed at last office visit.    Can further discuss this more in depth at next office visit.  This high-resolution CT does suggest potentially an atypical Mycobacterium infection such as MAI.  We can continue to monitor this.  They did recommend to follow-up with a another CT in 3 to 6 months.  No new recommendations or changes  Wyn Quaker, FNP

## 2019-01-29 ENCOUNTER — Other Ambulatory Visit: Payer: Self-pay | Admitting: Pulmonary Disease

## 2019-01-29 ENCOUNTER — Telehealth: Payer: Self-pay | Admitting: *Deleted

## 2019-01-29 DIAGNOSIS — A319 Mycobacterial infection, unspecified: Secondary | ICD-10-CM

## 2019-01-29 NOTE — Telephone Encounter (Signed)
Called patient and gave him results.  Order placed for LP.

## 2019-01-29 NOTE — Telephone Encounter (Signed)
Results given and LP order placed.

## 2019-01-29 NOTE — Telephone Encounter (Signed)
-----  Message from Alda Berthold, DO sent at 01/26/2019  7:58 AM EST ----- Please inform patient that his labs are within normal limits, except mild elevation in ESR which is nonspecific.  The next step is CSF testing, which I had discussed previously.  Please order CSF cell count and diff, protein, glucose, IgG index, oligoclonal bands, myelin basic protein, flow cytology, ACE.  Thanks.

## 2019-01-30 ENCOUNTER — Ambulatory Visit (INDEPENDENT_AMBULATORY_CARE_PROVIDER_SITE_OTHER): Payer: Medicare Other

## 2019-01-30 DIAGNOSIS — I441 Atrioventricular block, second degree: Secondary | ICD-10-CM

## 2019-01-30 LAB — CUP PACEART REMOTE DEVICE CHECK
Date Time Interrogation Session: 20200218134810
Implantable Lead Implant Date: 20190813
Implantable Lead Implant Date: 20190813
Implantable Lead Location: 753858
Implantable Lead Location: 753860
Implantable Pulse Generator Implant Date: 20190813
MDC IDC LEAD IMPLANT DT: 20190813
MDC IDC LEAD LOCATION: 753859
Pulse Gen Serial Number: 9043864

## 2019-01-31 ENCOUNTER — Ambulatory Visit: Payer: Medicare Other | Admitting: Pulmonary Disease

## 2019-02-03 ENCOUNTER — Other Ambulatory Visit: Payer: Self-pay | Admitting: Internal Medicine

## 2019-02-04 ENCOUNTER — Other Ambulatory Visit: Payer: Self-pay | Admitting: Internal Medicine

## 2019-02-04 ENCOUNTER — Other Ambulatory Visit: Payer: Self-pay | Admitting: Interventional Cardiology

## 2019-02-05 ENCOUNTER — Other Ambulatory Visit (HOSPITAL_COMMUNITY)
Admission: RE | Admit: 2019-02-05 | Discharge: 2019-02-05 | Disposition: A | Discharge status: Home / Self Care | Payer: Medicare Other | Source: Ambulatory Visit | Attending: Neurology | Admitting: Neurology

## 2019-02-05 ENCOUNTER — Ambulatory Visit
Admission: RE | Admit: 2019-02-05 | Discharge: 2019-02-05 | Disposition: A | Discharge status: Home / Self Care | Payer: Medicare Other | Source: Ambulatory Visit | Attending: Neurology | Admitting: Neurology

## 2019-02-05 VITALS — BP 111/55 | HR 66

## 2019-02-05 DIAGNOSIS — G610 Guillain-Barre syndrome: Secondary | ICD-10-CM

## 2019-02-05 DIAGNOSIS — G5732 Lesion of lateral popliteal nerve, left lower limb: Secondary | ICD-10-CM | POA: Insufficient documentation

## 2019-02-05 DIAGNOSIS — G629 Polyneuropathy, unspecified: Secondary | ICD-10-CM | POA: Diagnosis not present

## 2019-02-05 MED ORDER — HYDROCODONE-ACETAMINOPHEN 7.5-325 MG PO TABS: 1.0000 | ORAL_TABLET | Freq: Four times a day (QID) | ORAL | 0 refills | Status: DC | PRN

## 2019-02-05 MED ORDER — LORAZEPAM 1 MG PO TABS: ORAL_TABLET | ORAL | 0 refills | Status: DC

## 2019-02-05 NOTE — Telephone Encounter (Signed)
Last refill was 01/10/19

## 2019-02-05 NOTE — Telephone Encounter (Signed)
Last refill was 01/10/19 Last OV 03/27/18 routine Next OV not on file

## 2019-02-05 NOTE — Progress Notes (Signed)
One SST tube of blood drawn from right [facility] space by Lavenia Atlas, RN for LP labs; site unremarkable.

## 2019-02-05 NOTE — Discharge Instructions (Signed)

## 2019-02-06 ENCOUNTER — Encounter: Payer: Self-pay | Admitting: Podiatry

## 2019-02-07 NOTE — Progress Notes (Signed)
Remote pacemaker transmission.   

## 2019-02-09 ENCOUNTER — Ambulatory Visit: Payer: Medicare Other | Admitting: Podiatry

## 2019-02-09 ENCOUNTER — Encounter: Payer: Self-pay | Admitting: Podiatry

## 2019-02-09 DIAGNOSIS — M779 Enthesopathy, unspecified: Secondary | ICD-10-CM | POA: Diagnosis not present

## 2019-02-09 DIAGNOSIS — L84 Corns and callosities: Secondary | ICD-10-CM | POA: Diagnosis not present

## 2019-02-09 MED ORDER — TRIAMCINOLONE ACETONIDE 10 MG/ML IJ SUSP
10.0000 mg | Freq: Once | INTRAMUSCULAR | Status: AC
  Administered 2019-02-09: 10 mg

## 2019-02-10 NOTE — Progress Notes (Signed)
Subjective:   Patient ID: Jeffery [name], male   DOB: 78 y.o.   MRN: [id number]   HPI Patient presents with a painful lesion underneath her third digit and also is having a lot of pain under the fifth metatarsal left.  He wants surgery but currently is dealing with some other issues which need to be resolved before we would do procedures   ROS      Objective:  Physical Exam  Neurovascular status intact with patient found to have inflammation pain around the fifth MPJ left and keratotic lesion underneath third digit left that is painful with palpation     Assessment:  Inflammatory capsulitis fifth MPJ left and keratotic lesion third digit left plantar     Plan:  H&P conditions reviewed and today I did inject the fifth MPJ left knee milligrams Dexasone Kenalog 5 mg Xylocaine debrided lesion fifth left third left and reappoint for routine care with plans for surgery when his health stabilizes

## 2019-02-11 LAB — CNS IGG SYNTHESIS RATE, CSF+BLOOD
ALBUMIN SERUM: 3.6 g/dL (ref 3.2–4.6)
Albumin, CSF: 17.4 mg/dL (ref 8.0–42.0)
CNS-IgG Synthesis Rate: -2.3 mg/24 h (ref <=3.3)
IGG TOTAL CSF: 1.8 mg/dL (ref 0.8–7.7)
IgG (Immunoglobin G), Serum: 771 mg/dL (ref 600–1540)
IgG-Index: 0.48 (ref <0.66)

## 2019-02-11 LAB — PROTEIN, CSF: TOTAL PROTEIN, CSF: 33 mg/dL (ref 15–60)

## 2019-02-11 LAB — CSF CELL COUNT WITH DIFFERENTIAL
RBC Count, CSF: 0 cells/uL (ref 0–10)
WBC, CSF: 0 cells/uL (ref 0–5)

## 2019-02-11 LAB — OLIGOCLONAL BANDS, CSF + SERM

## 2019-02-11 LAB — ANGIOTENSIN CONVERTING ENZYME, CSF: ACE, CSF: 2 U/L (ref <=15)

## 2019-02-11 LAB — GLUCOSE, CSF: Glucose, CSF: 66 mg/dL (ref 40–80)

## 2019-02-11 LAB — MYELIN BASIC PROTEIN, CSF: Myelin Basic Protein: <2 mcg/L (ref 2.0–4.0)

## 2019-02-12 ENCOUNTER — Encounter: Payer: Self-pay | Admitting: Cardiology

## 2019-02-13 NOTE — Telephone Encounter (Signed)
Called and informed patient that his CSF labs are normal, no signs of inflammation.  Patient reports that he is doing better and has slight improvement in range of motion at the ankle with dorsiflexion, but power remains poor.  He has not noticed any new weakness.  I will continue to monitor him and plan to follow-up in 1 month.   Azaylah Stailey K. Posey Pronto, DO

## 2019-02-22 ENCOUNTER — Ambulatory Visit: Payer: Medicare Other | Admitting: Pulmonary Disease

## 2019-02-22 NOTE — Telephone Encounter (Signed)
Jeffery [name],  Patient sent this message to you this morning.    Dear Jeffery [name],  In the process of recovering from my third consecutive cold since leaving the hospital in early January. Again using the treatment protocol when I was hospitalized. I have had really good recovery from each respiratory exacerbation, but I feel that another week is required for me to stabilize before Pulmonary studies can be done.It might be beneficial for you to see me while this is still active.  [facility]  Jeffery Quaker, NP, please advise

## 2019-02-22 NOTE — Telephone Encounter (Signed)
02/22/2019 1155  I am sorry to hear that you have had these exacerbations.  I am glad that you have recovered well from them.  I am more than happy to see you at any point in time if you feel it is necessary.  From also fine pushing back the pulmonary studies to when you feel like you are clinically stable.  I believe I have open availability this coming Monday.  If you need to be seen sooner than that then only other nurse practitioners may be able to see you or potentially Dr. Valeta Harms if he has availabilities.  Wyn Quaker, FNP

## 2019-02-28 ENCOUNTER — Other Ambulatory Visit: Payer: Self-pay | Admitting: Internal Medicine

## 2019-02-28 MED ORDER — HYDROCODONE-ACETAMINOPHEN 7.5-325 MG PO TABS: 1.0000 | ORAL_TABLET | Freq: Four times a day (QID) | ORAL | 0 refills | Status: DC | PRN

## 2019-02-28 NOTE — Telephone Encounter (Signed)
Last refill was 02/05/19 Last OV 12/19/18 Next OV not made

## 2019-03-06 ENCOUNTER — Other Ambulatory Visit: Payer: Self-pay | Admitting: Internal Medicine

## 2019-03-06 NOTE — Telephone Encounter (Signed)
Check [place] registry last filled 02/09/2019,,/lmb

## 2019-03-08 NOTE — Telephone Encounter (Signed)
Spoke with pt about appt scheduled with Dr. Tamala Julian on 03/21/2019.  Rescheduled pt to 07/04/2019.  Pt aware to contact office sooner if any issues.

## 2019-03-21 ENCOUNTER — Ambulatory Visit: Payer: Medicare Other | Admitting: Interventional Cardiology

## 2019-03-23 ENCOUNTER — Other Ambulatory Visit: Payer: Self-pay | Admitting: Interventional Cardiology

## 2019-03-26 ENCOUNTER — Ambulatory Visit: Payer: Medicare Other | Admitting: Neurology

## 2019-03-27 ENCOUNTER — Encounter: Payer: Self-pay | Admitting: Internal Medicine

## 2019-03-27 ENCOUNTER — Other Ambulatory Visit: Payer: Self-pay | Admitting: Internal Medicine

## 2019-03-28 MED ORDER — FREESTYLE LIBRE 14 DAY SENSOR MISC: 1.0000 | 5 refills | Status: DC

## 2019-03-28 MED ORDER — HYDROCODONE-ACETAMINOPHEN 7.5-325 MG PO TABS: 1.0000 | ORAL_TABLET | Freq: Four times a day (QID) | ORAL | 0 refills | Status: DC | PRN

## 2019-03-28 NOTE — Telephone Encounter (Signed)
Last refill was 02/28/19 Last OV 12/19/18 Next OV N/A

## 2019-04-07 ENCOUNTER — Other Ambulatory Visit: Payer: Self-pay | Admitting: Internal Medicine

## 2019-04-09 MED ORDER — LORAZEPAM 1 MG PO TABS: ORAL_TABLET | ORAL | 0 refills | Status: DC

## 2019-04-09 NOTE — Telephone Encounter (Signed)
[place] Controlled Database Checked Last filled: 03/09/19 # 30 LOV w/you: 03/27/18 for anxiety  Next appt w/you: None

## 2019-04-12 NOTE — Telephone Encounter (Signed)
Thanks for f/u. Agree pulmonary studies may be delayed.

## 2019-04-14 ENCOUNTER — Other Ambulatory Visit: Payer: Self-pay | Admitting: Internal Medicine

## 2019-04-19 ENCOUNTER — Encounter: Payer: Self-pay | Admitting: Podiatry

## 2019-04-19 ENCOUNTER — Other Ambulatory Visit: Payer: Self-pay

## 2019-04-19 ENCOUNTER — Ambulatory Visit: Payer: Medicare Other | Admitting: Podiatry

## 2019-04-19 VITALS — Temp 98.4°F

## 2019-04-19 DIAGNOSIS — M21622 Bunionette of left foot: Secondary | ICD-10-CM

## 2019-04-19 DIAGNOSIS — L84 Corns and callosities: Secondary | ICD-10-CM

## 2019-04-20 NOTE — Progress Notes (Signed)
Subjective:   Patient ID: Jeffery [name], male   DOB: 78 y.o.   MRN: [id number]   HPI Patient presents stating the spot has really been bothering me on the side and the toe also and I know at one point I probably did not need surgery but I am trying to hold off and I am feeling better from my previous systemic problems and I think I might of had the COVID virus   ROS      Objective:  Physical Exam  Neurovascular status intact with patient found to have inflammation pain around the fifth MPJ left with keratotic lesion formation of that plus the third digit that are painful with prominent plantar metatarsal     Assessment:  Inflammatory capsulitis fifth MPJ left with structural tailor's bunion deformity digital deformity and keratotic lesion formation     Plan:  H&P conditions reviewed at today I went ahead and did do a careful lateral injection 3 mg dexamethasone Kenalog 5 mg Xylocaine and debrided lesions.  I then discussed possibilities for surgery and I do think that when he is fully healthy that would be of benefit to him and I also do think it is possible fifth metatarsal head resection will be necessary.  Patient will be seen back for [place] to recheck

## 2019-04-27 ENCOUNTER — Other Ambulatory Visit: Payer: Self-pay | Admitting: Internal Medicine

## 2019-04-30 MED ORDER — HYDROCODONE-ACETAMINOPHEN 7.5-325 MG PO TABS: 1.0000 | ORAL_TABLET | Freq: Four times a day (QID) | ORAL | 0 refills | Status: DC | PRN

## 2019-04-30 NOTE — Telephone Encounter (Signed)
Check [place] registry last filled 03/28/2019.Marland KitchenJohny [name]

## 2019-05-01 ENCOUNTER — Other Ambulatory Visit: Payer: Self-pay

## 2019-05-01 ENCOUNTER — Ambulatory Visit (INDEPENDENT_AMBULATORY_CARE_PROVIDER_SITE_OTHER): Payer: Medicare Other | Admitting: *Deleted

## 2019-05-01 DIAGNOSIS — I441 Atrioventricular block, second degree: Secondary | ICD-10-CM | POA: Diagnosis not present

## 2019-05-02 LAB — CUP PACEART REMOTE DEVICE CHECK
Date Time Interrogation Session: 20200520075504
Implantable Lead Implant Date: 20190813
Implantable Lead Implant Date: 20190813
Implantable Lead Implant Date: 20190813
Implantable Lead Location: 753858
Implantable Lead Location: 753859
Implantable Lead Location: 753860
Implantable Pulse Generator Implant Date: 20190813
Pulse Gen Serial Number: 9043864

## 2019-05-07 ENCOUNTER — Other Ambulatory Visit: Payer: Self-pay | Admitting: Internal Medicine

## 2019-05-08 MED ORDER — LORAZEPAM 1 MG PO TABS: ORAL_TABLET | ORAL | 0 refills | Status: DC

## 2019-05-08 NOTE — Telephone Encounter (Signed)
[place] Controlled Database Checked Last filled: 04/09/19 # 30 LOV w/you: 12/19/18 Next appt w/you: None

## 2019-05-10 ENCOUNTER — Encounter: Payer: Self-pay | Admitting: Cardiology

## 2019-05-10 NOTE — Progress Notes (Signed)
Remote pacemaker transmission.   

## 2019-05-21 ENCOUNTER — Encounter: Payer: Self-pay | Admitting: Internal Medicine

## 2019-05-21 DIAGNOSIS — Z1159 Encounter for screening for other viral diseases: Secondary | ICD-10-CM

## 2019-05-23 ENCOUNTER — Other Ambulatory Visit: Payer: Medicare Other

## 2019-05-23 DIAGNOSIS — Z1159 Encounter for screening for other viral diseases: Secondary | ICD-10-CM

## 2019-05-24 ENCOUNTER — Encounter: Payer: Self-pay | Admitting: Internal Medicine

## 2019-05-24 LAB — SAR COV2 SEROLOGY (COVID19)AB(IGG),IA: SARS CoV2 AB IGG: NEGATIVE

## 2019-05-25 ENCOUNTER — Telehealth: Payer: Self-pay

## 2019-05-25 NOTE — Telephone Encounter (Signed)
Spoke with patient and advised that pulmonary put in the CT back in feb. Pt is going to follow up with them in regards.

## 2019-05-29 ENCOUNTER — Other Ambulatory Visit: Payer: Self-pay | Admitting: Internal Medicine

## 2019-05-30 MED ORDER — HYDROCODONE-ACETAMINOPHEN 7.5-325 MG PO TABS: 1.0000 | ORAL_TABLET | Freq: Four times a day (QID) | ORAL | 0 refills | Status: DC | PRN

## 2019-05-30 NOTE — Telephone Encounter (Signed)
Last refill was 04/30/19.  He needs office visit. I wrote on rx

## 2019-06-07 ENCOUNTER — Other Ambulatory Visit: Payer: Self-pay | Admitting: Internal Medicine

## 2019-06-07 MED ORDER — LORAZEPAM 1 MG PO TABS: ORAL_TABLET | ORAL | 0 refills | Status: DC

## 2019-06-07 NOTE — Telephone Encounter (Signed)
Last OV 03/28/19 Last RF 05/08/19

## 2019-06-25 ENCOUNTER — Encounter: Payer: Self-pay | Admitting: Internal Medicine

## 2019-06-25 DIAGNOSIS — D229 Melanocytic nevi, unspecified: Secondary | ICD-10-CM

## 2019-06-26 ENCOUNTER — Ambulatory Visit (INDEPENDENT_AMBULATORY_CARE_PROVIDER_SITE_OTHER)
Admission: RE | Admit: 2019-06-26 | Discharge: 2019-06-26 | Disposition: A | Discharge status: Home / Self Care | Payer: Medicare Other | Source: Ambulatory Visit | Attending: Pulmonary Disease | Admitting: Pulmonary Disease

## 2019-06-26 ENCOUNTER — Encounter: Payer: Self-pay | Admitting: Internal Medicine

## 2019-06-26 ENCOUNTER — Other Ambulatory Visit: Payer: Self-pay

## 2019-06-26 DIAGNOSIS — A310 Pulmonary mycobacterial infection: Secondary | ICD-10-CM

## 2019-06-26 DIAGNOSIS — Z8709 Personal history of other diseases of the respiratory system: Secondary | ICD-10-CM | POA: Diagnosis not present

## 2019-06-26 DIAGNOSIS — A319 Mycobacterial infection, unspecified: Secondary | ICD-10-CM

## 2019-06-26 DIAGNOSIS — R918 Other nonspecific abnormal finding of lung field: Secondary | ICD-10-CM | POA: Diagnosis not present

## 2019-06-27 ENCOUNTER — Telehealth: Payer: Self-pay

## 2019-06-27 NOTE — Progress Notes (Signed)
Contact the patient and let him know that his CT chest is stable.  Still showing potential pattern of Mycobacterium infection.  Is the patient producing any sputum?  Is he using his flutter valve?  If he is producing sputum we can place an order for respiratory sputum culture, AFB, fungal.  To further evaluate his sputum.  Wyn Quaker, FNP

## 2019-06-27 NOTE — Telephone Encounter (Signed)
YOUR CARDIOLOGY TEAM HAS ARRANGED FOR AN E-VISIT FOR YOUR APPOINTMENT - PLEASE REVIEW IMPORTANT INFORMATION BELOW SEVERAL DAYS PRIOR TO YOUR APPOINTMENT  Due to the recent COVID-19 pandemic, we are transitioning in-person office visits to tele-medicine visits in an effort to decrease unnecessary exposure to our patients, their families, and staff. These visits are billed to your insurance just like a normal visit is. We also encourage you to sign up for MyChart if you have not already done so. You will need a smartphone if possible. For patients that do not have this, we can still complete the visit using a regular telephone but do prefer a smartphone to enable video when possible. You may have a family member that lives with you that can help. If possible, we also ask that you have a blood pressure cuff and scale at home to measure your blood pressure, heart rate and weight prior to your scheduled appointment. Patients with clinical needs that need an in-person evaluation and testing will still be able to come to the office if absolutely necessary. If you have any questions, feel free to call our office.     YOUR PROVIDER WILL BE USING THE FOLLOWING PLATFORM TO COMPLETE YOUR VISIT: Doximity  . IF USING MYCHART - How to Download the MyChart App to Your SmartPhone   - If Apple, go to App Store and type in MyChart in the search bar and download the app. If Android, ask patient to go to Google Play Store and type in MyChart in the search bar and download the app. The app is free but as with any other app downloads, your phone may require you to verify saved payment information or Apple/Android password.  - You will need to then log into the app with your MyChart username and password, and select [facility] as your healthcare provider to link the account.  - When it is time for your visit, go to the MyChart app, find appointments, and click Begin Video Visit. Be sure to Select Allow for your device to  access the Microphone and Camera for your visit. You will then be connected, and your provider will be with you shortly.  **If you have any issues connecting or need assistance, please contact MyChart service desk [phone] ([phone])**  **If using a computer, in order to ensure the best quality for your visit, you will need to use either of the following Internet Browsers: Google Chrome or Microsoft Edge**  . IF USING DOXIMITY or DOXY.ME - The staff will give you instructions on receiving your link to join the meeting the day of your visit.      2-3 DAYS BEFORE YOUR APPOINTMENT  You will receive a telephone call from one of our [facility] team members - your caller ID may say "Unknown caller." If this is a video visit, we will walk you through how to get the video launched on your phone. We will remind you check your blood pressure, heart rate and weight prior to your scheduled appointment. If you have an Apple Watch or Kardia, please upload any pertinent ECG strips the day before or morning of your appointment to MyChart. Our staff will also make sure you have reviewed the consent and agree to move forward with your scheduled tele-health visit.     THE DAY OF YOUR APPOINTMENT  Approximately 15 minutes prior to your scheduled appointment, you will receive a telephone call from one of HeartCare team - your caller ID may say "Unknown caller."    Our staff will confirm medications, vital signs for the day and any symptoms you may be experiencing. Please have this information available prior to the time of visit start. It may also be helpful for you to have a pad of paper and pen handy for any instructions given during your visit. They will also walk you through joining the smartphone meeting if this is a video visit.    CONSENT FOR TELE-HEALTH VISIT - PLEASE REVIEW  I hereby voluntarily request, consent and authorize [facility] and its employed or contracted physicians, physician  assistants, nurse practitioners or other licensed health care professionals (the Practitioner), to provide me with telemedicine health care services (the "Services") as deemed necessary by the treating Practitioner. I acknowledge and consent to receive the Services by the Practitioner via telemedicine. I understand that the telemedicine visit will involve communicating with the Practitioner through live audiovisual communication technology and the disclosure of certain medical information by electronic transmission. I acknowledge that I have been given the opportunity to request an in-person assessment or other available alternative prior to the telemedicine visit and am voluntarily participating in the telemedicine visit.  I understand that I have the right to withhold or withdraw my consent to the use of telemedicine in the course of my care at any time, without affecting my right to future care or treatment, and that the Practitioner or I may terminate the telemedicine visit at any time. I understand that I have the right to inspect all information obtained and/or recorded in the course of the telemedicine visit and may receive copies of available information for a reasonable fee.  I understand that some of the potential risks of receiving the Services via telemedicine include:  . Delay or interruption in medical evaluation due to technological equipment failure or disruption; . Information transmitted may not be sufficient (e.g. poor resolution of images) to allow for appropriate medical decision making by the Practitioner; and/or  . In rare instances, security protocols could fail, causing a breach of personal health information.  Furthermore, I acknowledge that it is my responsibility to provide information about my medical history, conditions and care that is complete and accurate to the best of my ability. I acknowledge that Practitioner's advice, recommendations, and/or decision may be based on  factors not within their control, such as incomplete or inaccurate data provided by me or distortions of diagnostic images or specimens that may result from electronic transmissions. I understand that the practice of medicine is not an exact science and that Practitioner makes no warranties or guarantees regarding treatment outcomes. I acknowledge that I will receive a copy of this consent concurrently upon execution via email to the email address I last provided but may also request a printed copy by calling the office of [facility].    I understand that my insurance will be billed for this visit.   I have read or had this consent read to me. . I understand the contents of this consent, which adequately explains the benefits and risks of the Services being provided via telemedicine.  . I have been provided ample opportunity to ask questions regarding this consent and the Services and have had my questions answered to my satisfaction. . I give my informed consent for the services to be provided through the use of telemedicine in my medical care  By participating in this telemedicine visit I agree to the above.  

## 2019-06-29 ENCOUNTER — Other Ambulatory Visit: Payer: Self-pay | Admitting: Internal Medicine

## 2019-06-29 MED ORDER — HYDROCODONE-ACETAMINOPHEN 7.5-325 MG PO TABS: 1.0000 | ORAL_TABLET | Freq: Four times a day (QID) | ORAL | 0 refills | Status: DC | PRN

## 2019-06-29 NOTE — Telephone Encounter (Signed)
Check [place] registry last filled 05/30/2019.Marland KitchenJohny [name]

## 2019-07-03 NOTE — Progress Notes (Deleted)
Error

## 2019-07-04 ENCOUNTER — Encounter: Payer: Self-pay | Admitting: Interventional Cardiology

## 2019-07-04 ENCOUNTER — Telehealth (INDEPENDENT_AMBULATORY_CARE_PROVIDER_SITE_OTHER): Payer: Medicare Other | Admitting: Interventional Cardiology

## 2019-07-04 ENCOUNTER — Other Ambulatory Visit: Payer: Self-pay

## 2019-07-04 VITALS — BP 130/76 | HR 67 | Ht 69.5 in | Wt 178.0 lb

## 2019-07-04 DIAGNOSIS — Z7189 Other specified counseling: Secondary | ICD-10-CM

## 2019-07-04 DIAGNOSIS — I5042 Chronic combined systolic (congestive) and diastolic (congestive) heart failure: Secondary | ICD-10-CM | POA: Diagnosis not present

## 2019-07-04 DIAGNOSIS — E785 Hyperlipidemia, unspecified: Secondary | ICD-10-CM

## 2019-07-04 DIAGNOSIS — I2510 Atherosclerotic heart disease of native coronary artery without angina pectoris: Secondary | ICD-10-CM

## 2019-07-04 DIAGNOSIS — I10 Essential (primary) hypertension: Secondary | ICD-10-CM

## 2019-07-04 DIAGNOSIS — Z950 Presence of cardiac pacemaker: Secondary | ICD-10-CM

## 2019-07-04 DIAGNOSIS — G4733 Obstructive sleep apnea (adult) (pediatric): Secondary | ICD-10-CM

## 2019-07-04 DIAGNOSIS — I447 Left bundle-branch block, unspecified: Secondary | ICD-10-CM

## 2019-07-04 DIAGNOSIS — E1159 Type 2 diabetes mellitus with other circulatory complications: Secondary | ICD-10-CM

## 2019-07-04 NOTE — Patient Instructions (Signed)
Medication Instructions:  Your physician recommends that you continue on your current medications as directed. Please refer to the Current Medication list given to you today.  If you need a refill on your cardiac medications before your next appointment, please call your pharmacy.   Lab work: You need Liver, Lipid, A1C and BMET when you are fasting sometime in the next 1-2 weeks.  If you have labs (blood work) drawn today and your tests are completely normal, you will receive your results only by: [name] MyChart Message (if you have MyChart) OR . A paper copy in the mail If you have any lab test that is abnormal or we need to change your treatment, we will call you to review the results.  Testing/Procedures: None  Follow-Up: At [facility], you and your health needs are our priority.  As part of our continuing mission to provide you with exceptional heart care, we have created designated Provider Care Teams.  These Care Teams include your primary Cardiologist (physician) and Advanced Practice Providers (APPs -  Physician Assistants and Nurse Practitioners) who all work together to provide you with the care you need, when you need it. You will need a follow up appointment in 6 months.  Please call our office 2 months in advance to schedule this appointment.  You may see Dr. Tamala Julian or one of the following Advanced Practice Providers on your designated Care Team:   Truitt Merle, NP Cecilie Kicks, NP . Kathyrn Drown, NP  Any Other Special Instructions Will Be Listed Below (If Applicable).

## 2019-07-04 NOTE — Progress Notes (Signed)
Virtual Visit via Video Note   This visit type was conducted due to national recommendations for restrictions regarding the COVID-19 Pandemic (e.g. social distancing) in an effort to limit this patient's exposure and mitigate transmission in our community.  Due to his co-morbid illnesses, this patient is at least at moderate risk for complications without adequate follow up.  This format is felt to be most appropriate for this patient at this time.  All issues noted in this document were discussed and addressed.  A limited physical exam was performed with this format.  Please refer to the patient's chart for his consent to telehealth for [facility].   Date:  07/04/2019   ID:  Jeffery [name], DOB 09-May-1941, MRN [id number]  Patient Location: Home Provider Location: Home  PCP:  [name], MD  Cardiologist:  No primary care provider on file.  Electrophysiologist:  None   Evaluation Performed:  Follow-Up Visit  Chief Complaint:  CHF  History of Present Illness:    Jeffery [name] is a 78 y.o. male with left bundle branch block, hypertension, hyperlipidemia, high grade AV block, obstructive sleep apnea, chronic combined systolic and diastolic HF, diabetes mellitus, obesity and recent hospital stay for dyspnea and hypoxia.  Jeffery [name] continues to have chest pain in a stable pattern.  This may occur once or twice a month.  When it occurs, sublingual nitroglycerin relieves it within 90 seconds.  He also admits that on 1 occasion he did not have nitro available and the resolution pattern was the same.  He denies orthopnea, PND, ankle edema, and syncope.  No orthopnea or other heart failure complaints.  No medication side effects.  The patient does not have symptoms concerning for COVID-19 infection (fever, chills, cough, or new shortness of breath).    Past Medical History:  Diagnosis Date  . Arthritis    "thumbs" (07/25/2018)  . Asthma   . CHF (congestive heart failure) ([place])   .  Chronic bronchitis ([place])   . Environmental allergies   . GERD (gastroesophageal reflux disease)   . Headache    "seasonal; w/environmental allergies" (07/25/2018)  . History of blood transfusion    "when I had laminectomy" (07/25/2018)  . HTN (hypertension)   . Hyperlipidemia   . LBBB (left bundle branch block) 1999  . Myocardial infarction [facility])    "was told I've had an old MI; probably in the 71s" (07/25/2018)  . Pneumonia    "as a child, age 54; viral pneumonia 3 times in the last 10 years" (07/25/2018)  . Presence of permanent cardiac pacemaker 07/25/2018  . Seasonal allergies   . Sleep apnea    "wife says I do" (07/25/2018)  . Type II diabetes mellitus ([place])    Past Surgical History:  Procedure Laterality Date  . BACK SURGERY    . BIV PACEMAKER INSERTION CRT-P  07/25/2018  . BIV PACEMAKER INSERTION CRT-P N/A 07/25/2018   Procedure: BIV PACEMAKER INSERTION CRT-P;  Surgeon: Evans Lance, MD;  Location: [place] CV [facility];  Service: Cardiovascular;  Laterality: N/A;  . CARDIAC CATHETERIZATION  2003   Dr Pernell Dupre; 85 % R circumflex obstruction  . CARPAL TUNNEL RELEASE Left 10/11/2014   Procedure: LEFT CARPAL TUNNEL RELEASE;  Surgeon: Roseanne Kaufman, MD;  Location: [place];  Service: Orthopedics;  Laterality: Left;  . COLONOSCOPY W/ BIOPSIES AND POLYPECTOMY  2018  . INGUINAL HERNIA REPAIR Right 1948  . INGUINAL HERNIA REPAIR Left 1988  . LUMBAR LAMINECTOMY  [place] Right 11/22/2014   Procedure: RIGHT LIMITED OPEN CARPAL TUNNEL RELEASE;  Surgeon: Roseanne Kaufman, MD;  Location: [place];  Service: Orthopedics;  Laterality: Right;  . TONSILLECTOMY  1958     Current Meds  Medication Sig  . ACCU-CHEK SOFTCLIX LANCETS lancets Use to test blood sugar daily as directed.  [name] albuterol (PROAIR HFA) 108 (90 BASE) MCG/ACT inhaler Inhale 2 puffs into the lungs every 6 (six) hours as needed for wheezing or shortness of breath.   [name]  albuterol (PROVENTIL) (2.5 MG/3ML) 0.083% nebulizer solution Take 3 mLs (2.5 mg total) by nebulization every 6 (six) hours as needed for wheezing or shortness of breath.  [name] aspirin 81 MG tablet Take 81 mg by mouth at bedtime.    . Continuous Blood Gluc Receiver (FREESTYLE LIBRE 14 DAY READER) DEVI 1 Device by Does not apply route every 14 (fourteen) days.  . Continuous Blood Gluc Sensor (FREESTYLE LIBRE 14 DAY SENSOR) MISC 1 each by Does not apply route every 14 (fourteen) days.  . famotidine (PEPCID) 20 MG tablet Take 1 tablet (20 mg total) by mouth daily.  . feeding supplement, ENSURE ENLIVE, (ENSURE ENLIVE) LIQD Take 237 mLs by mouth 2 (two) times daily between meals.  . fexofenadine (ALLEGRA) 180 MG tablet Take 180 mg by mouth daily as needed for allergies.   [name] glucose blood (ACCU-CHEK AVIVA PLUS) test strip Use as instructed to test blood sugar 6 times daily. DX Code: E11.59  . ibuprofen (ADVIL,MOTRIN) 800 MG tablet Take 800 mg by mouth every 8 (eight) hours as needed for headache or moderate pain.      Allergies:   Contrast media [iodinated diagnostic agents]   Social History   Tobacco Use  . Smoking status: Former Smoker    Packs/day: 2.00    Years: 2.00    Pack years: 4.00    Types: Cigarettes    Quit date: 12/14/1963    Years since quitting: 55.5  . Smokeless tobacco: Never Used  Substance Use Topics  . Alcohol use: Not Currently    Alcohol/week: 2.0 standard drinks    Types: 2 Glasses of wine per week    Comment: occasional  . Drug use: Never     Family Hx: The patient's family history includes Colon polyps in his father; Diabetes in his maternal aunt; Heart attack in his father; Heart failure in his mother; Hypertension in his mother; Subarachnoid hemorrhage (age of onset: 48) in his mother; Subarachnoid hemorrhage (age of onset: 39) in his paternal grandfather. There is no history of Colon cancer.  ROS:   Please see the history of present illness.    Intermittent  wheezing that is environmental related. All other systems reviewed and are negative.   Prior CV studies:   The following studies were reviewed today:  No new cardiac data.  Labs/Other Tests and Data Reviewed:    EKG:  No ECG reviewed.  Recent Labs: 12/11/2018: B Natriuretic Peptide 79.8; Hemoglobin 12.3; Platelets 190 12/12/2018: BUN 24; Creatinine, Ser 1.24; Potassium 4.0; Sodium 138 01/19/2019: ALT 13; TSH 0.81   Recent Lipid Panel Lab Results  Component Value Date/Time   CHOL 157 03/27/2018 08:40 AM   CHOL 213 (H) 08/29/2017 11:22 AM   CHOL 143 08/20/2014 07:50 AM   TRIG 122.0 03/27/2018 08:40 AM   TRIG 145 08/20/2014 07:50 AM   HDL 29.80 (L) 03/27/2018 08:40 AM   HDL 41 08/29/2017 11:22 AM   HDL 33 (L)  08/20/2014 07:50 AM   CHOLHDL 5 03/27/2018 08:40 AM   LDLCALC 103 (H) 03/27/2018 08:40 AM   LDLCALC 139 (H) 08/29/2017 11:22 AM   LDLCALC 81 08/20/2014 07:50 AM    Wt Readings from Last 3 Encounters:  07/04/19 178 lb (80.7 kg)  12/25/18 175 lb (79.4 kg)  12/20/18 177 lb (80.3 kg)     Objective:    Vital Signs:  BP 130/76   Pulse 67   Ht 5' 9.5" (1.765 m)   Wt 178 lb (80.7 kg)   BMI 25.91 kg/m    VITAL SIGNS:  reviewed GEN:  no acute distress CARDIOVASCULAR:  no peripheral edema  ASSESSMENT & PLAN:    1. Chronic combined systolic and diastolic CHF (congestive heart failure) ([place])   2. Coronary artery disease involving native coronary artery of native heart without angina pectoris   3. Essential hypertension   4. OSA (obstructive sleep apnea)   5. Left bundle branch block   6. Biventricular cardiac pacemaker in situ   7. Hyperlipidemia, unspecified hyperlipidemia type   8. Educated About Covid-19 Virus Infection    PLAN:  1. No heart failure complaints.  Resynchronization therapy has led to improvement in LV function.  Perhaps at the next office visit in 6 months we will recheck an echocardiogram. 2. Chest discomfort with atypical features but  responsive to nitroglycerin.  Secondary prevention discussed. 3. Salt restriction and weight control discussed. 4. Compliant with CPAP. 5. Not discussed in any detail. 6. Followed in device clinic. 7. Target LDL less than 70. 8. Social distancing, masking, and handwashing stressed.  Overall education and awareness concerning primary/secondary risk prevention was discussed in detail: LDL less than 70, hemoglobin A1c less than 7, blood pressure target less than 130/80 mmHg, >150 minutes of moderate aerobic activity per week, avoidance of smoking, weight control (via diet and exercise), and continued surveillance/management of/for obstructive sleep apnea.   COVID-19 Education: The signs and symptoms of COVID-19 were discussed with the patient and how to seek care for testing (follow up with PCP or arrange E-visit).  The importance of social distancing was discussed today.  Time:   Today, I have spent 15 minutes with the patient with telehealth technology discussing the above problems.     Medication Adjustments/Labs and Tests Ordered: Current medicines are reviewed at length with the patient today.  Concerns regarding medicines are outlined above.   Tests Ordered: No orders of the defined types were placed in this encounter.    Medication Changes: No orders of the defined types were placed in this encounter.   Follow Up:  In Person in 6 month(s)  Signed, Sinclair Grooms, MD  07/04/2019 9:22 AM    [place]

## 2019-07-07 ENCOUNTER — Other Ambulatory Visit: Payer: Self-pay | Admitting: Internal Medicine

## 2019-07-08 ENCOUNTER — Other Ambulatory Visit: Payer: Self-pay | Admitting: Internal Medicine

## 2019-07-09 ENCOUNTER — Encounter: Payer: Self-pay | Admitting: Internal Medicine

## 2019-07-09 ENCOUNTER — Other Ambulatory Visit: Payer: Self-pay | Admitting: Internal Medicine

## 2019-07-09 DIAGNOSIS — L819 Disorder of pigmentation, unspecified: Secondary | ICD-10-CM | POA: Diagnosis not present

## 2019-07-09 DIAGNOSIS — L570 Actinic keratosis: Secondary | ICD-10-CM | POA: Diagnosis not present

## 2019-07-09 DIAGNOSIS — D229 Melanocytic nevi, unspecified: Secondary | ICD-10-CM | POA: Diagnosis not present

## 2019-07-09 DIAGNOSIS — L814 Other melanin hyperpigmentation: Secondary | ICD-10-CM | POA: Diagnosis not present

## 2019-07-09 DIAGNOSIS — L821 Other seborrheic keratosis: Secondary | ICD-10-CM | POA: Diagnosis not present

## 2019-07-10 ENCOUNTER — Other Ambulatory Visit: Payer: Medicare Other

## 2019-07-10 ENCOUNTER — Other Ambulatory Visit: Payer: Self-pay

## 2019-07-10 DIAGNOSIS — I5042 Chronic combined systolic (congestive) and diastolic (congestive) heart failure: Secondary | ICD-10-CM | POA: Diagnosis not present

## 2019-07-10 DIAGNOSIS — I2510 Atherosclerotic heart disease of native coronary artery without angina pectoris: Secondary | ICD-10-CM | POA: Diagnosis not present

## 2019-07-10 DIAGNOSIS — E1159 Type 2 diabetes mellitus with other circulatory complications: Secondary | ICD-10-CM

## 2019-07-11 LAB — HEPATIC FUNCTION PANEL
ALT: 18 IU/L (ref 0–44)
AST: 20 IU/L (ref 0–40)
Albumin: 4.4 g/dL (ref 3.7–4.7)
Alkaline Phosphatase: 76 IU/L (ref 39–117)
Bilirubin Total: 0.3 mg/dL (ref 0.0–1.2)
Bilirubin, Direct: 0.1 mg/dL (ref 0.00–0.40)
Total Protein: 6.4 g/dL (ref 6.0–8.5)

## 2019-07-11 LAB — BASIC METABOLIC PANEL
BUN/Creatinine Ratio: 15 (ref 10–24)
BUN: 18 mg/dL (ref 8–27)
CO2: 24 mmol/L (ref 20–29)
Calcium: 9.5 mg/dL (ref 8.6–10.2)
Chloride: 102 mmol/L (ref 96–106)
Creatinine, Ser: 1.17 mg/dL (ref 0.76–1.27)
GFR calc Af Amer: 70 mL/min/{1.73_m2} (ref >59)
GFR calc non Af Amer: 61 mL/min/{1.73_m2} (ref >59)
Glucose: 115 mg/dL — ABNORMAL HIGH (ref 65–99)
Potassium: 4.6 mmol/L (ref 3.5–5.2)
Sodium: 141 mmol/L (ref 134–144)

## 2019-07-11 LAB — LIPID PANEL
Chol/HDL Ratio: 3.8 ratio (ref 0.0–5.0)
Cholesterol, Total: 148 mg/dL (ref 100–199)
HDL: 39 mg/dL — ABNORMAL LOW (ref >39)
LDL Calculated: 88 mg/dL (ref 0–99)
Triglycerides: 105 mg/dL (ref 0–149)
VLDL Cholesterol Cal: 21 mg/dL (ref 5–40)

## 2019-07-11 LAB — HEMOGLOBIN A1C
Est. average glucose Bld gHb Est-mCnc: 137 mg/dL
Hgb A1c MFr Bld: 6.4 % — ABNORMAL HIGH (ref 4.8–5.6)

## 2019-07-11 MED ORDER — LORAZEPAM 1 MG PO TABS: ORAL_TABLET | ORAL | 0 refills | Status: DC

## 2019-07-11 NOTE — Telephone Encounter (Signed)
Pt has made appt for 08/01/19. Pls advise on Lorazepam../lmb

## 2019-07-13 ENCOUNTER — Telehealth: Payer: Self-pay | Admitting: *Deleted

## 2019-07-13 DIAGNOSIS — E785 Hyperlipidemia, unspecified: Secondary | ICD-10-CM

## 2019-07-13 MED ORDER — ROSUVASTATIN CALCIUM 10 MG PO TABS: ORAL_TABLET | ORAL | 1 refills | Status: DC

## 2019-07-13 NOTE — Telephone Encounter (Signed)
Spoke with pt and went over results and recommendations.  Pt states that he only takes the 5mg  3x/wk.  Advised to increase what he is currently doing to 10mg .  Scheduled labs for 9/23.  Pt verbalized understanding and was appreciative for call.

## 2019-07-13 NOTE — Telephone Encounter (Signed)
-----   Message from Belva Crome, MD sent at 07/13/2019  2:46 PM EDT ----- Let the patient know the A1 C is better. LDL too high. Increase Rosuvastatin to 10 mg daily. Liver and lipid in 8 weeks. A copy will be sent to [name], MD

## 2019-07-16 ENCOUNTER — Encounter: Payer: Self-pay | Admitting: Podiatry

## 2019-07-23 ENCOUNTER — Other Ambulatory Visit: Payer: Self-pay

## 2019-07-23 ENCOUNTER — Encounter: Payer: Self-pay | Admitting: Podiatry

## 2019-07-23 ENCOUNTER — Ambulatory Visit (INDEPENDENT_AMBULATORY_CARE_PROVIDER_SITE_OTHER): Payer: Medicare Other | Admitting: Podiatry

## 2019-07-23 VITALS — Temp 98.0°F

## 2019-07-23 DIAGNOSIS — L84 Corns and callosities: Secondary | ICD-10-CM | POA: Diagnosis not present

## 2019-07-23 DIAGNOSIS — M2042 Other hammer toe(s) (acquired), left foot: Secondary | ICD-10-CM

## 2019-07-24 NOTE — Progress Notes (Signed)
Subjective:   Patient ID: Jeffery [name], male   DOB: 78 y.o.   MRN: [id number]   HPI Patient states the lesions really been hurting real right third toe and also the pain in the outside of the foot seems some improved but I know it is there   ROS      Objective:  Physical Exam  Neurovascular status intact with patient's plantar third digit left much improved with moderate discomfort and severe keratotic lesion formation with discomfort of the outside fifth MPJ that improved with only mild discomfort upon deep palpation     Assessment:  Plantar lesion left present with mild improvement but painful and plantarflexed fifth metatarsal left with pain     Plan:  H&P conditions reviewed and I debrided the lesion third digit and applied padding to take pressure off of it and discussed continued treatment for the fifth metatarsal head which we will defer today but will probably be necessary in future

## 2019-07-31 ENCOUNTER — Telehealth: Payer: Self-pay | Admitting: *Deleted

## 2019-07-31 ENCOUNTER — Encounter: Payer: Self-pay | Admitting: Internal Medicine

## 2019-07-31 ENCOUNTER — Other Ambulatory Visit: Payer: Self-pay | Admitting: Internal Medicine

## 2019-07-31 ENCOUNTER — Ambulatory Visit (INDEPENDENT_AMBULATORY_CARE_PROVIDER_SITE_OTHER): Payer: Medicare Other | Admitting: *Deleted

## 2019-07-31 DIAGNOSIS — I5022 Chronic systolic (congestive) heart failure: Secondary | ICD-10-CM

## 2019-07-31 DIAGNOSIS — I447 Left bundle-branch block, unspecified: Secondary | ICD-10-CM

## 2019-07-31 LAB — CUP PACEART REMOTE DEVICE CHECK
Battery Remaining Longevity: 94 mo
Battery Remaining Percentage: 95.5 %
Battery Voltage: 2.99 V
Brady Statistic AP VP Percent: 13 %
Brady Statistic AP VS Percent: 1 %
Brady Statistic AS VP Percent: 84 %
Brady Statistic AS VS Percent: 2 %
Brady Statistic RA Percent Paced: 12 %
Date Time Interrogation Session: 20200818060026
Implantable Lead Implant Date: 20190813
Implantable Lead Implant Date: 20190813
Implantable Lead Implant Date: 20190813
Implantable Lead Location: 753858
Implantable Lead Location: 753859
Implantable Lead Location: 753860
Implantable Pulse Generator Implant Date: 20190813
Lead Channel Impedance Value: 440 Ohm
Lead Channel Impedance Value: 510 Ohm
Lead Channel Impedance Value: 800 Ohm
Lead Channel Pacing Threshold Amplitude: 0.5 V
Lead Channel Pacing Threshold Amplitude: 0.75 V
Lead Channel Pacing Threshold Amplitude: 1.25 V
Lead Channel Pacing Threshold Pulse Width: 0.5 ms
Lead Channel Pacing Threshold Pulse Width: 0.5 ms
Lead Channel Pacing Threshold Pulse Width: 0.5 ms
Lead Channel Sensing Intrinsic Amplitude: 1.8 mV
Lead Channel Sensing Intrinsic Amplitude: 9.9 mV
Lead Channel Setting Pacing Amplitude: 1.75 V
Lead Channel Setting Pacing Amplitude: 2.25 V
Lead Channel Setting Pacing Amplitude: 2.5 V
Lead Channel Setting Pacing Pulse Width: 0.5 ms
Lead Channel Setting Pacing Pulse Width: 0.5 ms
Lead Channel Setting Sensing Sensitivity: 2 mV
Pulse Gen Serial Number: 9043864

## 2019-07-31 NOTE — Patient Instructions (Addendum)
Tests ordered today. Your results will be released to [place] (or called to you) after review.  If any changes need to be made, you will be notified at that same time.  All other Health Maintenance issues reviewed.   All recommended immunizations and age-appropriate screenings are up-to-date or discussed.  Flu immunization administered today.   Medications reviewed and updated.  Changes include :  none    Please followup in 6 months   Health Maintenance, Male Adopting a healthy lifestyle and getting preventive care are important in promoting health and wellness. Ask your health care provider about:  The right schedule for you to have regular tests and exams.  Things you can do on your own to prevent diseases and keep yourself healthy. What should I know about diet, weight, and exercise? Eat a healthy diet   Eat a diet that includes plenty of vegetables, fruits, low-fat dairy products, and lean protein.  Do not eat a lot of foods that are high in solid fats, added sugars, or sodium. Maintain a healthy weight Body mass index (BMI) is a measurement that can be used to identify possible weight problems. It estimates body fat based on height and weight. Your health care provider can help determine your BMI and help you achieve or maintain a healthy weight. Get regular exercise Get regular exercise. This is one of the most important things you can do for your health. Most adults should:  Exercise for at least 150 minutes each week. The exercise should increase your heart rate and make you sweat (moderate-intensity exercise).  Do strengthening exercises at least twice a week. This is in addition to the moderate-intensity exercise.  Spend less time sitting. Even light physical activity can be beneficial. Watch cholesterol and blood lipids Have your blood tested for lipids and cholesterol at 76 years of age, then have this test every 5 years. You may need to have your cholesterol levels  checked more often if:  Your lipid or cholesterol levels are high.  You are older than 75 years of age.  You are at high risk for heart disease. What should I know about cancer screening? Many types of cancers can be detected early and may often be prevented. Depending on your health history and family history, you may need to have cancer screening at various ages. This may include screening for:  Colorectal cancer.  Prostate cancer.  Skin cancer.  Lung cancer. What should I know about heart disease, diabetes, and high blood pressure? Blood pressure and heart disease  High blood pressure causes heart disease and increases the risk of stroke. This is more likely to develop in people who have high blood pressure readings, are of African descent, or are overweight.  Talk with your health care provider about your target blood pressure readings.  Have your blood pressure checked: ? Every 3-5 years if you are 74-44 years of age. ? Every year if you are 52 years old or older.  If you are between the ages of 53 and 29 and are a current or former smoker, ask your health care provider if you should have a one-time screening for abdominal aortic aneurysm (AAA). Diabetes Have regular diabetes screenings. This checks your fasting blood sugar level. Have the screening done:  Once every three years after age 37 if you are at a normal weight and have a low risk for diabetes.  More often and at a younger age if you are overweight or have a high risk for  diabetes. What should I know about preventing infection? Hepatitis B If you have a higher risk for hepatitis B, you should be screened for this virus. Talk with your health care provider to find out if you are at risk for hepatitis B infection. Hepatitis C Blood testing is recommended for:  Everyone born from [birth year] through 1963.  Anyone with known risk factors for hepatitis C. Sexually transmitted infections (STIs)  You should be screened  each year for STIs, including gonorrhea and chlamydia, if: ? You are sexually active and are younger than 76 years of age. ? You are older than 76 years of age and your health care provider tells you that you are at risk for this type of infection. ? Your sexual activity has changed since you were last screened, and you are at increased risk for chlamydia or gonorrhea. Ask your health care provider if you are at risk.  Ask your health care provider about whether you are at high risk for HIV. Your health care provider may recommend a prescription medicine to help prevent HIV infection. If you choose to take medicine to prevent HIV, you should first get tested for HIV. You should then be tested every 3 months for as long as you are taking the medicine. Follow these instructions at home: Lifestyle  Do not use any products that contain nicotine or tobacco, such as cigarettes, e-cigarettes, and chewing tobacco. If you need help quitting, ask your health care provider.  Do not use street drugs.  Do not share needles.  Ask your health care provider for help if you need support or information about quitting drugs. Alcohol use  Do not drink alcohol if your health care provider tells you not to drink.  If you drink alcohol: ? Limit how much you have to 0-2 drinks a day. ? Be aware of how much alcohol is in your drink. In the [place] one drink equals one 12 oz bottle of beer (355 mL), one 5 oz glass of wine (148 mL), or one 1 oz glass of hard liquor (44 mL). General instructions  Schedule regular health, dental, and eye exams.  Stay current with your vaccines.  Tell your health care provider if: ? You often feel depressed. ? You have ever been abused or do not feel safe at home. Summary  Adopting a healthy lifestyle and getting preventive care are important in promoting health and wellness.  Follow your health care provider's instructions about healthy diet, exercising, and getting tested or  screened for diseases.  Follow your health care provider's instructions on monitoring your cholesterol and blood pressure. This information is not intended to replace advice given to you by your health care provider. Make sure you discuss any questions you have with your health care provider. Document Released: 05/27/2008 Document Revised: 11/22/2018 Document Reviewed: 11/22/2018 Elsevier Patient Education  2020 [employer].

## 2019-07-31 NOTE — Progress Notes (Signed)
Subjective:    Patient ID: Jeffery [name], male    DOB: [birth year], 78 y.o.   MRN: [id number]  HPI He is here for a physical exam.   He denies any changes in his health and has no concerns.  He has been feeling well overall.  Medications and allergies reviewed with patient and updated if appropriate.  Patient Active Problem List   Diagnosis Date Noted  . Peroneal mononeuropathy, left 12/25/2018  . Diabetic polyneuropathy associated with type 2 diabetes mellitus ([place]) 12/25/2018  . Cylindrical bronchiectasis ([place]) 12/20/2018  . Viral pneumonia 12/12/2018  . Community acquired pneumonia 11/18/2018  . Folliculitis 12/15/7251  . Chronic systolic heart failure ([place]) 07/25/2018  . Anxiety 03/26/2018  . Metatarsal bone fracture 03/07/2018  . Encounter for chronic pain management 04/13/2017  . Osteoarthritis 04/12/2017  . Chronic combined systolic and diastolic CHF (congestive heart failure) ([place]) 01/24/2015  . OSA (obstructive sleep apnea) 10/03/2014  . Left bundle branch block 11/27/2013  . Essential hypertension 11/27/2013  . GERD (gastroesophageal reflux disease) 02/27/2013  . Type 2 diabetes mellitus with vascular disease ([place]) 04/09/2009  . HLD (hyperlipidemia) 09/06/2008  . Coronary atherosclerosis 09/06/2008  . Asthma 09/06/2008    Current Outpatient Medications on File Prior to Visit  Medication Sig Dispense Refill  . ACCU-CHEK SOFTCLIX LANCETS lancets Use to test blood sugar daily as directed. 100 each 3  . albuterol (PROAIR HFA) 108 (90 BASE) MCG/ACT inhaler Inhale 2 puffs into the lungs every 6 (six) hours as needed for wheezing or shortness of breath.     [name] albuterol (PROVENTIL) (2.5 MG/3ML) 0.083% nebulizer solution Take 3 mLs (2.5 mg total) by nebulization every 6 (six) hours as needed for wheezing or shortness of breath. 150 mL 5  . aspirin 81 MG tablet Take 81 mg by mouth at bedtime.      . Continuous Blood Gluc Receiver (FREESTYLE LIBRE 14 DAY READER) DEVI 1  Device by Does not apply route every 14 (fourteen) days. 1 Device 0  . Continuous Blood Gluc Sensor (FREESTYLE LIBRE 14 DAY SENSOR) MISC 1 each by Does not apply route every 14 (fourteen) days. 2 each 5  . fexofenadine (ALLEGRA) 180 MG tablet Take 180 mg by mouth daily as needed for allergies.     [name] glucose blood (ACCU-CHEK AVIVA PLUS) test strip Use as instructed to test blood sugar 6 times daily. DX Code: E11.59 600 each 3  . HYDROcodone-acetaminophen (NORCO) 7.5-325 MG tablet Take 1 tablet by mouth every 6 (six) hours as needed (for chronic joint pain). Need office visit for more refills 30 tablet 0  . ibuprofen (ADVIL,MOTRIN) 800 MG tablet Take 800 mg by mouth every 8 (eight) hours as needed for headache or moderate pain.     [name] ipratropium (ATROVENT) 0.02 % nebulizer solution Take 2.5 mLs (0.5 mg total) by nebulization every 6 (six) hours. 75 mL 3  . levalbuterol (XOPENEX) 1.25 MG/3ML nebulizer solution Take 1.25 mg by nebulization every 6 (six) hours. 120 mL 3  . LORazepam (ATIVAN) 1 MG tablet TAKE 1/2 TO 1 TABLET BY MOUTH DAILY AS NEEDED. Need office visit for more refills 30 tablet 0  . metFORMIN (GLUCOPHAGE) 500 MG tablet Take 1 tablet (500 mg total) by mouth daily with breakfast. Need office visit for more refills 90 tablet 0  . montelukast (SINGULAIR) 10 MG tablet Take 1 tablet (10 mg total) by mouth at bedtime. Need office visit for more refills. 90 tablet 0  . Multiple Vitamin (  MULTIVITAMIN WITH MINERALS) TABS tablet Take 1 tablet by mouth daily.    . nebivolol (BYSTOLIC) 5 MG tablet Take 1 tablet (5 mg total) by mouth daily.    . nitroGLYCERIN (NITROSTAT) 0.4 MG SL tablet Place 0.4 mg under the tongue every 5 (five) minutes x 3 doses as needed for chest pain.    [name] Respiratory Therapy Supplies (FLUTTER) DEVI Twice a day and prn as needed, may increase if feeling worse 1 each 0  . rosuvastatin (CRESTOR) 10 MG tablet Take one tablet by mouth daily on Monday, Wednesday and Friday 15 tablet 1   . SYMBICORT 160-4.5 MCG/ACT inhaler USE 1 TO 2 PUFFS INTO THE  LUNGS EVERY 12 HOURS AS  NEEDED 30.6 g 1  . vardenafil (LEVITRA) 20 MG tablet TAKE 1 TABLET BY MOUTH AS DIRECTED. CAN NOT BE TAKEN WITH NITROGLYCERIN. (Patient taking differently: Take 20 mg by mouth daily as needed for erectile dysfunction (Cannot be taken with Nitroglycerin). ) 6 tablet 2  . zinc gluconate 50 MG tablet Take 50 mg by mouth daily.    . famotidine (PEPCID) 20 MG tablet Take 1 tablet (20 mg total) by mouth daily. 30 tablet 0   No current facility-administered medications on file prior to visit.     Past Medical History:  Diagnosis Date  . Arthritis    "thumbs" (07/25/2018)  . Asthma   . CHF (congestive heart failure) ([place])   . Chronic bronchitis ([place])   . Environmental allergies   . GERD (gastroesophageal reflux disease)   . Headache    "seasonal; w/environmental allergies" (07/25/2018)  . History of blood transfusion    "when I had laminectomy" (07/25/2018)  . HTN (hypertension)   . Hyperlipidemia   . LBBB (left bundle branch block) 1999  . Myocardial infarction [facility])    "was told I've had an old MI; probably in the 41s" (07/25/2018)  . Pneumonia    "as a child, age 76; viral pneumonia 3 times in the last 10 years" (07/25/2018)  . Presence of permanent cardiac pacemaker 07/25/2018  . Seasonal allergies   . Sleep apnea    "wife says I do" (07/25/2018)  . Type II diabetes mellitus ([place])     Past Surgical History:  Procedure Laterality Date  . BACK SURGERY    . BIV PACEMAKER INSERTION CRT-P  07/25/2018  . BIV PACEMAKER INSERTION CRT-P N/A 07/25/2018   Procedure: BIV PACEMAKER INSERTION CRT-P;  Surgeon: Evans Lance, MD;  Location: [place] [facility];  Service: Cardiovascular;  Laterality: N/A;  . CARDIAC CATHETERIZATION  2003   Dr Pernell Dupre; 85 % R circumflex obstruction  . CARPAL TUNNEL RELEASE Left 10/11/2014   Procedure: LEFT CARPAL TUNNEL RELEASE;  Surgeon: Roseanne Kaufman, MD;  Location: [place];  Service: Orthopedics;  Laterality: Left;  . COLONOSCOPY W/ BIOPSIES AND POLYPECTOMY  2018  . INGUINAL HERNIA REPAIR Right 1948  . INGUINAL HERNIA REPAIR Left 1988  . LUMBAR LAMINECTOMY  1984  . MINOR CARPAL TUNNEL Right 11/22/2014   Procedure: RIGHT LIMITED OPEN CARPAL TUNNEL RELEASE;  Surgeon: Roseanne Kaufman, MD;  Location: [place];  Service: Orthopedics;  Laterality: Right;  . TONSILLECTOMY  1958    Social History   Socioeconomic History  . Marital status: Married    Spouse name: Not on file  . Number of children: 3  . Years of education: Not on file  . Highest education level: Professional school degree (e.g., MD, DDS, DVM, JD)  Occupational History  . Occupation: retired Pension scheme manager: [employer]  . Financial resource strain: Not on file  . Food insecurity    Worry: Not on file    Inability: Not on file  . Transportation needs    Medical: Not on file    Non-medical: Not on file  Tobacco Use  . Smoking status: Former Smoker    Packs/day: 2.00    Years: 2.00    Pack years: 4.00    Types: Cigarettes    Quit date: 12/14/1963    Years since quitting: 55.6  . Smokeless tobacco: Never Used  Substance and Sexual Activity  . Alcohol use: Not Currently    Alcohol/week: 2.0 standard drinks    Types: 2 Glasses of wine per week    Comment: occasional  . Drug use: Never  . Sexual activity: Yes  Lifestyle  . Physical activity    Days per week: Not on file    Minutes per session: Not on file  . Stress: Not on file  Relationships  . Social Herbalist on phone: Not on file    Gets together: Not on file    Attends religious service: Not on file    Active member of club or organization: Not on file    Attends meetings of clubs or organizations: Not on file    Relationship status: Not on file  Other Topics Concern  . Not on file  Social History Narrative   Lives with wife in a 2 story home.  Has  3 daughters.  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      Family History  Problem Relation Age of Onset  . Heart attack Father        4s  . Colon polyps Father   . Heart failure Mother        90s  . Subarachnoid hemorrhage Mother 51  . Hypertension Mother   . Subarachnoid hemorrhage Paternal Grandfather 25  . Diabetes Maternal Aunt   . Colon cancer Neg Hx     Review of Systems  Constitutional: Negative for chills and fever.  Eyes: Negative for visual disturbance.  Respiratory: Positive for cough (cough in am, occ sputum). Negative for chest tightness, shortness of breath and wheezing.   Cardiovascular: Positive for chest pain (a couple of episodes at rest - discussed with cardio). Negative for palpitations and leg swelling.  Gastrointestinal: Negative for abdominal pain, blood in stool, constipation, diarrhea and nausea.       Occ gerd  Genitourinary: Negative for difficulty urinating, dysuria and hematuria.  Musculoskeletal: Positive for arthralgias (hands, back, shoulder, ankles). Negative for neck pain (clicking in neck).  Skin: Negative for color change and rash.  Neurological: Positive for headaches (sinus). Negative for light-headedness.  Psychiatric/Behavioral: Negative for dysphoric mood. The patient is not nervous/anxious.        Objective:   Vitals:   08/01/19 1322  BP: 128/78  Pulse: 65  Temp: 97.9 F (36.6 C)  SpO2: 97%   Filed Weights   08/01/19 1322  Weight: 185 lb (83.9 kg)   Body mass index is 26.93 kg/m.  Wt Readings from Last 3 Encounters:  08/01/19 185 lb (83.9 kg)  07/04/19 178 lb (80.7 kg)  12/25/18 175 lb (79.4 kg)     Physical Exam Constitutional: He appears well-developed and well-nourished. No distress.  HENT:  Head: Normocephalic and atraumatic.  Right Ear: External ear normal.  Left Ear: External  ear normal.  Mouth/Throat: Oropharynx is clear and moist.  Normal ear canals and TM b/l  Eyes: Conjunctivae and EOM are normal.   Neck: Neck supple. No tracheal deviation present. No thyromegaly present.  No carotid bruit  Cardiovascular: Normal rate, regular rhythm, normal heart sounds and intact distal pulses.   No murmur heard. Pulmonary/Chest: Effort normal and breath sounds normal. No respiratory distress. He has no wheezes. He has no rales.  Abdominal: Soft.  Small umbilical hernia-nontender.  He exhibits no distension. There is no tenderness.  Genitourinary: deferred  Musculoskeletal: He exhibits no edema.  Lymphadenopathy:   He has no cervical adenopathy.  Skin: Skin is warm and dry. He is not diaphoretic.  Psychiatric: He has a normal mood and affect. His behavior is normal.         Assessment & Plan:   Physical exam: Screening blood work  ordered Immunizations   Discussed shingrix, tdap due, will get flu Colonoscopy   Up to date  Eye exams   Due - will schedule Exercise   Yard work, house work, takes care of grandkids during the day Weight  Mild overweight Skin no concerns - saw Dr Ronnald Ramp Substance abuse  none  See Problem List for Assessment and Plan of chronic medical problems.  FU in 6 months

## 2019-07-31 NOTE — Telephone Encounter (Signed)
231 RA lead noise reversions noted on 07/31/19 remote transmission, available AMS episodes also consistent with RA lead noise, EGMs suggest low voltage EMI/noise. RA lead trends stable. No RV lead noise noted. Pt is due for 9 month f/u with Dr. Lovena Le.  Spoke with patient. 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 Explained we may be able to reprogram around this noise. Pt in agreement with plan, accepted appointment with Dr. Lovena Le on Friday, 08/03/19 at 10:30am.      COVID-19 Pre-Screening Questions:  . In the past 7 to 10 days have you had a cough,  shortness of breath, headache, congestion, fever (100 or greater) body aches, chills, sore throat, or sudden loss of taste or sense of smell? . Have you been around anyone with known Covid 19? [name] Have you been around anyone who is awaiting Covid 19 test results in the past 7 to 10 days? . Have you been around anyone who has been exposed to Covid 19, or has mentioned symptoms of Covid 19 within the past 7 to 10 days?  Pt answered "no" to all questions and is aware to come by himself to the appointment and to wear his own mask.

## 2019-08-01 ENCOUNTER — Encounter: Payer: Self-pay | Admitting: Internal Medicine

## 2019-08-01 ENCOUNTER — Other Ambulatory Visit: Payer: Self-pay

## 2019-08-01 ENCOUNTER — Other Ambulatory Visit (INDEPENDENT_AMBULATORY_CARE_PROVIDER_SITE_OTHER): Payer: Medicare Other

## 2019-08-01 ENCOUNTER — Ambulatory Visit (INDEPENDENT_AMBULATORY_CARE_PROVIDER_SITE_OTHER): Payer: Medicare Other | Admitting: Internal Medicine

## 2019-08-01 VITALS — BP 128/78 | HR 65 | Temp 97.9°F | Ht 69.5 in | Wt 185.0 lb

## 2019-08-01 DIAGNOSIS — E1159 Type 2 diabetes mellitus with other circulatory complications: Secondary | ICD-10-CM

## 2019-08-01 DIAGNOSIS — I10 Essential (primary) hypertension: Secondary | ICD-10-CM | POA: Diagnosis not present

## 2019-08-01 DIAGNOSIS — K219 Gastro-esophageal reflux disease without esophagitis: Secondary | ICD-10-CM

## 2019-08-01 DIAGNOSIS — G8929 Other chronic pain: Secondary | ICD-10-CM

## 2019-08-01 DIAGNOSIS — Z23 Encounter for immunization: Secondary | ICD-10-CM | POA: Diagnosis not present

## 2019-08-01 DIAGNOSIS — M159 Polyosteoarthritis, unspecified: Secondary | ICD-10-CM

## 2019-08-01 DIAGNOSIS — F419 Anxiety disorder, unspecified: Secondary | ICD-10-CM

## 2019-08-01 DIAGNOSIS — Z0000 Encounter for general adult medical examination without abnormal findings: Secondary | ICD-10-CM | POA: Diagnosis not present

## 2019-08-01 DIAGNOSIS — I25118 Atherosclerotic heart disease of native coronary artery with other forms of angina pectoris: Secondary | ICD-10-CM

## 2019-08-01 DIAGNOSIS — I5042 Chronic combined systolic (congestive) and diastolic (congestive) heart failure: Secondary | ICD-10-CM

## 2019-08-01 DIAGNOSIS — E782 Mixed hyperlipidemia: Secondary | ICD-10-CM

## 2019-08-01 DIAGNOSIS — J45909 Unspecified asthma, uncomplicated: Secondary | ICD-10-CM

## 2019-08-01 LAB — MICROALBUMIN / CREATININE URINE RATIO
Creatinine,U: 40.6 mg/dL
Microalb Creat Ratio: 1.7 mg/g (ref 0.0–30.0)
Microalb, Ur: <0.7 mg/dL (ref 0.0–1.9)

## 2019-08-01 MED ORDER — HYDROCODONE-ACETAMINOPHEN 7.5-325 MG PO TABS: 1.0000 | ORAL_TABLET | Freq: Four times a day (QID) | ORAL | 0 refills | Status: DC | PRN

## 2019-08-01 NOTE — Telephone Encounter (Signed)
Last refill was 06/29/19  Seeing you today for an appointment

## 2019-08-01 NOTE — Assessment & Plan Note (Signed)
Asymptomatic, euvolemic on exam Following with cardiology Continue current medications

## 2019-08-01 NOTE — Assessment & Plan Note (Addendum)
Has had couple episodes of chest pain-?  Angina Has already seen his cardiologist, Dr. Tamala Julian Continue aspirin 81 mg daily, statin

## 2019-08-01 NOTE — Assessment & Plan Note (Signed)
Currently well controlled Following with pulmonary Uses nebulizers, inhaler as needed only

## 2019-08-01 NOTE — Assessment & Plan Note (Addendum)
Chronic arthritis of multiple joints Takes Excedrin, which helps Uses Norco only as needed-taking appropriately and medication is effective without side effects We will continue Norco-takes 2 pills on a rare occasion, but only typically takes 1 a day if needed UDS today  [place] controlled substance database checked.  Medication refilled

## 2019-08-01 NOTE — Assessment & Plan Note (Signed)
Taking Pepcid daily Controlled Continue

## 2019-08-01 NOTE — Assessment & Plan Note (Signed)
Takes Excedrin most days Takes hydrocodone as needed, rarely takes 2 in 1 day Medications effective and without side effects We will continue current dose of hydrocodone up to 2 pills a day UDS today Follow-up in 6 months

## 2019-08-01 NOTE — Assessment & Plan Note (Signed)
A1c 6.4% Sugars well controlled Continue metformin We will check urine microalbumin

## 2019-08-01 NOTE — Assessment & Plan Note (Signed)
BP well controlled Current regimen effective and well tolerated Continue current medications at current doses cmp  

## 2019-08-01 NOTE — Assessment & Plan Note (Signed)
Takes Ativan at bedtime as needed Effective, no side effects Okay to continue

## 2019-08-01 NOTE — Assessment & Plan Note (Signed)
Continue Crestor Dose recently increased by cardiology and he will recheck lipids in a few weeks

## 2019-08-03 ENCOUNTER — Encounter: Payer: Self-pay | Admitting: Internal Medicine

## 2019-08-03 ENCOUNTER — Other Ambulatory Visit: Payer: Self-pay

## 2019-08-03 ENCOUNTER — Ambulatory Visit (INDEPENDENT_AMBULATORY_CARE_PROVIDER_SITE_OTHER): Payer: Medicare Other | Admitting: Internal Medicine

## 2019-08-03 VITALS — BP 136/80 | HR 66 | Ht 69.5 in | Wt 183.0 lb

## 2019-08-03 DIAGNOSIS — I5042 Chronic combined systolic (congestive) and diastolic (congestive) heart failure: Secondary | ICD-10-CM | POA: Diagnosis not present

## 2019-08-03 DIAGNOSIS — I447 Left bundle-branch block, unspecified: Secondary | ICD-10-CM | POA: Diagnosis not present

## 2019-08-03 DIAGNOSIS — I2510 Atherosclerotic heart disease of native coronary artery without angina pectoris: Secondary | ICD-10-CM

## 2019-08-03 DIAGNOSIS — Z950 Presence of cardiac pacemaker: Secondary | ICD-10-CM | POA: Diagnosis not present

## 2019-08-03 DIAGNOSIS — I5022 Chronic systolic (congestive) heart failure: Secondary | ICD-10-CM | POA: Diagnosis not present

## 2019-08-03 LAB — CUP PACEART INCLINIC DEVICE CHECK
Battery Remaining Longevity: 91 mo
Battery Voltage: 2.99 V
Brady Statistic RA Percent Paced: 12 %
Brady Statistic RV Percent Paced: 97 %
Date Time Interrogation Session: 20200821114545
Implantable Lead Implant Date: 20190813
Implantable Lead Implant Date: 20190813
Implantable Lead Implant Date: 20190813
Implantable Lead Location: 753858
Implantable Lead Location: 753859
Implantable Lead Location: 753860
Implantable Pulse Generator Implant Date: 20190813
Lead Channel Impedance Value: 425 Ohm
Lead Channel Impedance Value: 537.5 Ohm
Lead Channel Impedance Value: 800 Ohm
Lead Channel Pacing Threshold Amplitude: 0.5 V
Lead Channel Pacing Threshold Amplitude: 0.75 V
Lead Channel Pacing Threshold Amplitude: 1 V
Lead Channel Pacing Threshold Pulse Width: 0.5 ms
Lead Channel Pacing Threshold Pulse Width: 0.5 ms
Lead Channel Pacing Threshold Pulse Width: 0.5 ms
Lead Channel Sensing Intrinsic Amplitude: 12 mV
Lead Channel Sensing Intrinsic Amplitude: 2.3 mV
Lead Channel Setting Pacing Amplitude: 1.75 V
Lead Channel Setting Pacing Amplitude: 2 V
Lead Channel Setting Pacing Amplitude: 2.5 V
Lead Channel Setting Pacing Pulse Width: 0.5 ms
Lead Channel Setting Pacing Pulse Width: 0.5 ms
Lead Channel Setting Sensing Sensitivity: 2 mV
Pulse Gen Serial Number: 9043864

## 2019-08-03 LAB — PAIN MGMT, PROFILE 8 W/CONF, U
6 Acetylmorphine: NEGATIVE ng/mL
Alcohol Metabolites: NEGATIVE ng/mL (ref <500)
Alphahydroxyalprazolam: NEGATIVE ng/mL
Alphahydroxymidazolam: NEGATIVE ng/mL
Alphahydroxytriazolam: NEGATIVE ng/mL
Aminoclonazepam: NEGATIVE ng/mL
Amphetamines: NEGATIVE ng/mL
Benzodiazepines: POSITIVE ng/mL
Buprenorphine, Urine: NEGATIVE ng/mL
Cocaine Metabolite: NEGATIVE ng/mL
Codeine: NEGATIVE ng/mL
Creatinine: 37.7 mg/dL
Hydrocodone: 57 ng/mL
Hydromorphone: 86 ng/mL
Hydroxyethylflurazepam: NEGATIVE ng/mL
Lorazepam: 213 ng/mL
MDMA: NEGATIVE ng/mL
Marijuana Metabolite: NEGATIVE ng/mL
Morphine: NEGATIVE ng/mL
Nordiazepam: NEGATIVE ng/mL
Norhydrocodone: 70 ng/mL
Opiates: POSITIVE ng/mL
Oxazepam: NEGATIVE ng/mL
Oxidant: NEGATIVE ug/mL
Oxycodone: NEGATIVE ng/mL
Temazepam: NEGATIVE ng/mL
pH: 5.7 (ref 4.5–9.0)

## 2019-08-03 NOTE — Progress Notes (Signed)
HPI Mr. Demchak returns today for followup. He is a pleasant 78 yo man with mild LV dysfunction, [place] syncope who underwent biv PPM insertion a year ago. He has done well in the interim. No recurrent syncope. No chest pain or sob. He is helping to care for his grandchildren. Allergies  Allergen Reactions  . Contrast Media [Iodinated Diagnostic Agents] Other (See Comments)    Redness and warm sensation, this reaction was noted on 02/27/13 during a Cardiac MRI per pt.  Pt sts he had erythema on his head, chest, and shoulders.  Pt had a pacemaker placed August 2019 and was pre-medicated for the dye and had no allergic reaction at that time. -Carissa Mozingo B.S. RT(R)(CT)     Current Outpatient Medications  Medication Sig Dispense Refill  . ACCU-CHEK SOFTCLIX LANCETS lancets Use to test blood sugar daily as directed. 100 each 3  . albuterol (PROAIR HFA) 108 (90 BASE) MCG/ACT inhaler Inhale 2 puffs into the lungs every 6 (six) hours as needed for wheezing or shortness of breath.     [name] albuterol (PROVENTIL) (2.5 MG/3ML) 0.083% nebulizer solution Take 3 mLs (2.5 mg total) by nebulization every 6 (six) hours as needed for wheezing or shortness of breath. 150 mL 5  . aspirin 81 MG tablet Take 81 mg by mouth at bedtime.      . Continuous Blood Gluc Receiver (FREESTYLE LIBRE 14 DAY READER) DEVI 1 Device by Does not apply route every 14 (fourteen) days. 1 Device 0  . Continuous Blood Gluc Sensor (FREESTYLE LIBRE 14 DAY SENSOR) MISC 1 each by Does not apply route every 14 (fourteen) days. 2 each 5  . fexofenadine (ALLEGRA) 180 MG tablet Take 180 mg by mouth daily as needed for allergies.     [name] glucose blood (ACCU-CHEK AVIVA PLUS) test strip Use as instructed to test blood sugar 6 times daily. DX Code: E11.59 600 each 3  . HYDROcodone-acetaminophen (NORCO) 7.5-325 MG tablet Take 1 tablet by mouth every 6 (six) hours as needed (for chronic joint pain). Need office visit for more refills 30  tablet 0  . ibuprofen (ADVIL,MOTRIN) 800 MG tablet Take 800 mg by mouth every 8 (eight) hours as needed for headache or moderate pain.     [name] ipratropium (ATROVENT) 0.02 % nebulizer solution Take 2.5 mLs (0.5 mg total) by nebulization every 6 (six) hours. 75 mL 3  . levalbuterol (XOPENEX) 1.25 MG/3ML nebulizer solution Take 1.25 mg by nebulization every 6 (six) hours. 120 mL 3  . LORazepam (ATIVAN) 1 MG tablet TAKE 1/2 TO 1 TABLET BY MOUTH DAILY AS NEEDED. Need office visit for more refills 30 tablet 0  . metFORMIN (GLUCOPHAGE) 500 MG tablet Take 1 tablet (500 mg total) by mouth daily with breakfast. Need office visit for more refills 90 tablet 0  . montelukast (SINGULAIR) 10 MG tablet Take 1 tablet (10 mg total) by mouth at bedtime. Need office visit for more refills. 90 tablet 0  . Multiple Vitamin (MULTIVITAMIN WITH MINERALS) TABS tablet Take 1 tablet by mouth daily.    . nebivolol (BYSTOLIC) 5 MG tablet Take 1 tablet (5 mg total) by mouth daily.    . nitroGLYCERIN (NITROSTAT) 0.4 MG SL tablet Place 0.4 mg under the tongue every 5 (five) minutes x 3 doses as needed for chest pain.    [name] Respiratory Therapy Supplies (FLUTTER) DEVI Twice a day and prn as needed, may increase if feeling worse 1 each 0  .  rosuvastatin (CRESTOR) 10 MG tablet Take one tablet by mouth daily on Monday, Wednesday and Friday 15 tablet 1  . SYMBICORT 160-4.5 MCG/ACT inhaler USE 1 TO 2 PUFFS INTO THE  LUNGS EVERY 12 HOURS AS  NEEDED 30.6 g 1  . vardenafil (LEVITRA) 20 MG tablet TAKE 1 TABLET BY MOUTH AS DIRECTED. CAN NOT BE TAKEN WITH NITROGLYCERIN. (Patient taking differently: Take 20 mg by mouth daily as needed for erectile dysfunction (Cannot be taken with Nitroglycerin). ) 6 tablet 2  . zinc gluconate 50 MG tablet Take 50 mg by mouth daily.    . famotidine (PEPCID) 20 MG tablet Take 1 tablet (20 mg total) by mouth daily. 30 tablet 0   No current facility-administered medications for this visit.      Past Medical  History:  Diagnosis Date  . Arthritis    "thumbs" (07/25/2018)  . Asthma   . CHF (congestive heart failure) ([place])   . Chronic bronchitis ([place])   . Environmental allergies   . GERD (gastroesophageal reflux disease)   . Headache    "seasonal; w/environmental allergies" (07/25/2018)  . History of blood transfusion    "when I had laminectomy" (07/25/2018)  . HTN (hypertension)   . Hyperlipidemia   . LBBB (left bundle branch block) 1999  . Myocardial infarction [facility])    "was told I've had an old MI; probably in the 101s" (07/25/2018)  . Pneumonia    "as a child, age 72; viral pneumonia 3 times in the last 10 years" (07/25/2018)  . Presence of permanent cardiac pacemaker 07/25/2018  . Seasonal allergies   . Sleep apnea    "wife says I do" (07/25/2018)  . Type II diabetes mellitus (HCC)     ROS:   All systems reviewed and negative except as noted in the HPI.   Past Surgical History:  Procedure Laterality Date  . BACK SURGERY    . BIV PACEMAKER INSERTION CRT-P  07/25/2018  . BIV PACEMAKER INSERTION CRT-P N/A 07/25/2018   Procedure: BIV PACEMAKER INSERTION CRT-P;  Surgeon: Evans Lance, MD;  Location: [place] [facility];  Service: Cardiovascular;  Laterality: N/A;  . CARDIAC CATHETERIZATION  2003   Dr Pernell Dupre; 85 % R circumflex obstruction  . CARPAL TUNNEL RELEASE Left 10/11/2014   Procedure: LEFT CARPAL TUNNEL RELEASE;  Surgeon: Roseanne Kaufman, MD;  Location: [place];  Service: Orthopedics;  Laterality: Left;  . COLONOSCOPY W/ BIOPSIES AND POLYPECTOMY  2018  . INGUINAL HERNIA REPAIR Right 1948  . INGUINAL HERNIA REPAIR Left 1988  . LUMBAR LAMINECTOMY  1984  . MINOR CARPAL TUNNEL Right 11/22/2014   Procedure: RIGHT LIMITED OPEN CARPAL TUNNEL RELEASE;  Surgeon: Roseanne Kaufman, MD;  Location: [place];  Service: Orthopedics;  Laterality: Right;  . TONSILLECTOMY  1958     Family History  Problem Relation Age of Onset  . Heart attack Father         4s  . Colon polyps Father   . Heart failure Mother        90s  . Subarachnoid hemorrhage Mother 51  . Hypertension Mother   . Subarachnoid hemorrhage Paternal Grandfather 44  . Diabetes Maternal Aunt   . Colon cancer Neg Hx      Social History   Socioeconomic History  . Marital status: Married    Spouse name: Not on file  . Number of children: 3  . Years of education: Not on file  . Highest education level: Professional  school degree (e.g., MD, DDS, DVM, JD)  Occupational History  . Occupation: retired Pension scheme manager: [employer]  . Financial resource strain: Not on file  . Food insecurity    Worry: Not on file    Inability: Not on file  . Transportation needs    Medical: Not on file    Non-medical: Not on file  Tobacco Use  . Smoking status: Former Smoker    Packs/day: 2.00    Years: 2.00    Pack years: 4.00    Types: Cigarettes    Quit date: 12/14/1963    Years since quitting: 55.6  . Smokeless tobacco: Never Used  Substance and Sexual Activity  . Alcohol use: Not Currently    Alcohol/week: 2.0 standard drinks    Types: 2 Glasses of wine per week    Comment: occasional  . Drug use: Never  . Sexual activity: Yes  Lifestyle  . Physical activity    Days per week: Not on file    Minutes per session: Not on file  . Stress: Not on file  Relationships  . Social Herbalist on phone: Not on file    Gets together: Not on file    Attends religious service: Not on file    Active member of club or organization: Not on file    Attends meetings of clubs or organizations: Not on file    Relationship status: Not on file  . Intimate partner violence    Fear of current or ex partner: Not on file    Emotionally abused: Not on file    Physically abused: Not on file    Forced sexual activity: Not on file  Other Topics Concern  . Not on file  Social History Narrative   Lives with wife in a 2 story home.  Has 3 daughters.   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       BP 136/80   Pulse 66   Ht 5' 9.5" (1.765 m)   Wt 183 lb (83 kg)   SpO2 97%   BMI 26.64 kg/m   Physical Exam:  Well appearing NAD HEENT: Unremarkable Neck:  No JVD, no thyromegally Lymphatics:  No adenopathy Back:  No CVA tenderness Lungs:  Clear with no wheezes HEART:  Regular rate rhythm, no murmurs, no rubs, no clicks Abd:  soft, positive bowel sounds, no organomegally, no rebound, no guarding Ext:  2 plus pulses, no edema, no cyanosis, no clubbing Skin:  No rashes no nodules Neuro:  CN II through XII intact, motor grossly intact  EKG - NR with biv pacing  DEVICE  Normal device function.  See PaceArt for details.   Assess/Plan: 1. Syncope - he has not had any since his PPM was inserted.  2. Chronic systolic and diastolic heart failure - his symptoms are class 1. He will continue his current meds. 3. PPM - his [employer] Jude DDD PM is working normally.   Mikle Bosworth.D.

## 2019-08-03 NOTE — Patient Instructions (Signed)
Medication Instructions:  Your physician recommends that you continue on your current medications as directed. Please refer to the Current Medication list given to you today.  Labwork: None ordered.  Testing/Procedures: None ordered.  Follow-Up: Your physician recommends that you schedule a follow-up appointment in:   43 mo with Dr. Lovena Le  Any Other Special Instructions Will Be Listed Below (If Applicable).     If you need a refill on your cardiac medications before your next appointment, please call your pharmacy.

## 2019-08-04 ENCOUNTER — Other Ambulatory Visit: Payer: Self-pay | Admitting: Interventional Cardiology

## 2019-08-04 ENCOUNTER — Other Ambulatory Visit: Payer: Self-pay | Admitting: Internal Medicine

## 2019-08-08 NOTE — Progress Notes (Signed)
Remote pacemaker transmission.   

## 2019-08-11 ENCOUNTER — Other Ambulatory Visit: Payer: Self-pay | Admitting: Internal Medicine

## 2019-08-13 MED ORDER — LORAZEPAM 1 MG PO TABS: ORAL_TABLET | ORAL | 5 refills | Status: DC

## 2019-08-13 NOTE — Telephone Encounter (Signed)
Last OV 08/01/19 Next OV NA Last RF 07/11/19

## 2019-08-30 ENCOUNTER — Other Ambulatory Visit: Payer: Self-pay | Admitting: Internal Medicine

## 2019-08-31 MED ORDER — HYDROCODONE-ACETAMINOPHEN 7.5-325 MG PO TABS: 1.0000 | ORAL_TABLET | Freq: Four times a day (QID) | ORAL | 0 refills | Status: DC | PRN

## 2019-08-31 NOTE — Telephone Encounter (Signed)
Last OV 08/01/19 Next OV NA Last RF 08/01/19

## 2019-09-05 ENCOUNTER — Other Ambulatory Visit: Payer: Medicare Other

## 2019-09-12 ENCOUNTER — Telehealth: Payer: Self-pay | Admitting: Internal Medicine

## 2019-09-12 NOTE — Telephone Encounter (Signed)
Sent mychart message

## 2019-09-12 NOTE — Telephone Encounter (Signed)
Pt calling - states that last refill of HYDROcodone-acetaminophen (NORCO) 7.5-325 MG tablet states that he needs a follow up visit before new refills.  Pt states that he just had his physical in August and wants to know if he does in fact need a follow up visit prior to next refill.

## 2019-09-14 ENCOUNTER — Other Ambulatory Visit: Payer: Self-pay

## 2019-09-14 ENCOUNTER — Other Ambulatory Visit: Payer: Medicare Other | Admitting: *Deleted

## 2019-09-14 DIAGNOSIS — E785 Hyperlipidemia, unspecified: Secondary | ICD-10-CM | POA: Diagnosis not present

## 2019-09-14 LAB — HEPATIC FUNCTION PANEL
ALT: 21 IU/L (ref 0–44)
AST: 24 IU/L (ref 0–40)
Albumin: 4.1 g/dL (ref 3.7–4.7)
Alkaline Phosphatase: 92 IU/L (ref 39–117)
Bilirubin Total: <0.2 mg/dL (ref 0.0–1.2)
Bilirubin, Direct: 0.05 mg/dL (ref 0.00–0.40)
Total Protein: 6.5 g/dL (ref 6.0–8.5)

## 2019-09-14 LAB — LIPID PANEL
Chol/HDL Ratio: 3.3 ratio (ref 0.0–5.0)
Cholesterol, Total: 122 mg/dL (ref 100–199)
HDL: 37 mg/dL — ABNORMAL LOW (ref >39)
LDL Chol Calc (NIH): 68 mg/dL (ref 0–99)
Triglycerides: 85 mg/dL (ref 0–149)
VLDL Cholesterol Cal: 17 mg/dL (ref 5–40)

## 2019-09-18 ENCOUNTER — Emergency Department (HOSPITAL_BASED_OUTPATIENT_CLINIC_OR_DEPARTMENT_OTHER)
Admission: EM | Admit: 2019-09-18 | Discharge: 2019-09-18 | Discharge status: Against Medical Advice | Payer: Medicare Other

## 2019-09-18 ENCOUNTER — Other Ambulatory Visit: Payer: Self-pay

## 2019-09-19 ENCOUNTER — Other Ambulatory Visit: Payer: Self-pay | Admitting: Internal Medicine

## 2019-09-19 DIAGNOSIS — M25561 Pain in right knee: Secondary | ICD-10-CM | POA: Diagnosis not present

## 2019-09-19 DIAGNOSIS — M25361 Other instability, right knee: Secondary | ICD-10-CM | POA: Diagnosis not present

## 2019-09-19 MED ORDER — HYDROCODONE-ACETAMINOPHEN 7.5-325 MG PO TABS: 1.0000 | ORAL_TABLET | Freq: Four times a day (QID) | ORAL | 0 refills | Status: DC | PRN

## 2019-09-19 NOTE — Telephone Encounter (Signed)
Last OV 08/01/19 Next OV NA Last RF 08/31/19

## 2019-09-24 DIAGNOSIS — E119 Type 2 diabetes mellitus without complications: Secondary | ICD-10-CM | POA: Diagnosis not present

## 2019-10-01 ENCOUNTER — Other Ambulatory Visit: Payer: Self-pay | Admitting: Internal Medicine

## 2019-10-02 ENCOUNTER — Other Ambulatory Visit: Payer: Self-pay | Admitting: Internal Medicine

## 2019-10-20 ENCOUNTER — Other Ambulatory Visit: Payer: Self-pay | Admitting: Internal Medicine

## 2019-10-22 MED ORDER — HYDROCODONE-ACETAMINOPHEN 7.5-325 MG PO TABS: 1.0000 | ORAL_TABLET | Freq: Four times a day (QID) | ORAL | 0 refills | Status: DC | PRN

## 2019-10-22 NOTE — Telephone Encounter (Signed)
Last RF 09/20/19 Last OV 08/01/19 Next OV NA

## 2019-10-31 LAB — CUP PACEART REMOTE DEVICE CHECK
Battery Remaining Longevity: 93 mo
Battery Remaining Percentage: 95.5 %
Battery Voltage: 2.99 V
Brady Statistic AP VP Percent: 20 %
Brady Statistic AP VS Percent: 1 %
Brady Statistic AS VP Percent: 78 %
Brady Statistic AS VS Percent: 1.3 %
Brady Statistic RA Percent Paced: 20 %
Date Time Interrogation Session: 20201117070010
Implantable Lead Implant Date: 20190813
Implantable Lead Implant Date: 20190813
Implantable Lead Implant Date: 20190813
Implantable Lead Location: 753858
Implantable Lead Location: 753859
Implantable Lead Location: 753860
Implantable Pulse Generator Implant Date: 20190813
Lead Channel Impedance Value: 400 Ohm
Lead Channel Impedance Value: 460 Ohm
Lead Channel Impedance Value: 790 Ohm
Lead Channel Pacing Threshold Amplitude: 0.5 V
Lead Channel Pacing Threshold Amplitude: 0.625 V
Lead Channel Pacing Threshold Amplitude: 1.125 V
Lead Channel Pacing Threshold Pulse Width: 0.5 ms
Lead Channel Pacing Threshold Pulse Width: 0.5 ms
Lead Channel Pacing Threshold Pulse Width: 0.5 ms
Lead Channel Sensing Intrinsic Amplitude: 1.9 mV
Lead Channel Sensing Intrinsic Amplitude: 9.6 mV
Lead Channel Setting Pacing Amplitude: 1.625
Lead Channel Setting Pacing Amplitude: 2.125
Lead Channel Setting Pacing Amplitude: 2.5 V
Lead Channel Setting Pacing Pulse Width: 0.5 ms
Lead Channel Setting Pacing Pulse Width: 0.5 ms
Lead Channel Setting Sensing Sensitivity: 2 mV
Pulse Gen Serial Number: 9043864

## 2019-11-02 ENCOUNTER — Ambulatory Visit (INDEPENDENT_AMBULATORY_CARE_PROVIDER_SITE_OTHER): Payer: Medicare Other | Admitting: *Deleted

## 2019-11-02 DIAGNOSIS — I447 Left bundle-branch block, unspecified: Secondary | ICD-10-CM

## 2019-11-02 DIAGNOSIS — I5022 Chronic systolic (congestive) heart failure: Secondary | ICD-10-CM

## 2019-11-17 ENCOUNTER — Other Ambulatory Visit: Payer: Self-pay | Admitting: Internal Medicine

## 2019-11-18 ENCOUNTER — Other Ambulatory Visit: Payer: Self-pay | Admitting: Interventional Cardiology

## 2019-11-20 ENCOUNTER — Other Ambulatory Visit: Payer: Self-pay | Admitting: Internal Medicine

## 2019-11-21 ENCOUNTER — Other Ambulatory Visit: Payer: Self-pay | Admitting: Internal Medicine

## 2019-11-21 MED ORDER — HYDROCODONE-ACETAMINOPHEN 7.5-325 MG PO TABS: 1.0000 | ORAL_TABLET | Freq: Four times a day (QID) | ORAL | 0 refills | Status: DC | PRN

## 2019-11-21 NOTE — Telephone Encounter (Signed)
Last OV 08/01/19 Last RF 10/22/19

## 2019-11-23 NOTE — Telephone Encounter (Signed)
Dr. Quay Burow, You refilled this on 11/21/19. I can't deny this. I don't have access. Can you deny please. Thanks.

## 2019-11-29 ENCOUNTER — Encounter: Payer: Self-pay | Admitting: Internal Medicine

## 2019-12-03 NOTE — Progress Notes (Signed)
Remote pacemaker transmission.   

## 2019-12-09 ENCOUNTER — Other Ambulatory Visit: Payer: Self-pay | Admitting: Internal Medicine

## 2019-12-17 ENCOUNTER — Other Ambulatory Visit: Payer: Self-pay | Admitting: Internal Medicine

## 2019-12-18 ENCOUNTER — Other Ambulatory Visit: Payer: Self-pay | Admitting: Internal Medicine

## 2019-12-19 MED ORDER — HYDROCODONE-ACETAMINOPHEN 7.5-325 MG PO TABS: 1.0000 | ORAL_TABLET | Freq: Four times a day (QID) | ORAL | 0 refills | Status: DC | PRN

## 2019-12-19 NOTE — Telephone Encounter (Signed)
Check [place] registry last filled 11/21/2019.Marland KitchenJohny Chess

## 2019-12-28 ENCOUNTER — Ambulatory Visit (INDEPENDENT_AMBULATORY_CARE_PROVIDER_SITE_OTHER): Payer: Medicare Other

## 2019-12-28 ENCOUNTER — Other Ambulatory Visit: Payer: Self-pay

## 2019-12-28 ENCOUNTER — Ambulatory Visit: Payer: Medicare Other | Admitting: Podiatry

## 2019-12-28 ENCOUNTER — Encounter: Payer: Self-pay | Admitting: Podiatry

## 2019-12-28 ENCOUNTER — Other Ambulatory Visit: Payer: Self-pay | Admitting: Podiatry

## 2019-12-28 DIAGNOSIS — M779 Enthesopathy, unspecified: Secondary | ICD-10-CM | POA: Diagnosis not present

## 2019-12-28 DIAGNOSIS — Q828 Other specified congenital malformations of skin: Secondary | ICD-10-CM

## 2019-12-28 DIAGNOSIS — M7752 Other enthesopathy of left foot: Secondary | ICD-10-CM | POA: Diagnosis not present

## 2019-12-28 DIAGNOSIS — L600 Ingrowing nail: Secondary | ICD-10-CM | POA: Diagnosis not present

## 2019-12-28 DIAGNOSIS — M79672 Pain in left foot: Secondary | ICD-10-CM

## 2019-12-28 NOTE — Progress Notes (Signed)
Subjective:   Patient ID: Jeffery [name], male   DOB: 79 y.o.   MRN: [id number]   HPI Patient presents with several different problems with incurvated nail bed third left that become very painful inflammation fluid around the fifth metatarsal head left and 4 separate lesions that are painful when pressed.  He stated he got around 4 months of relief and that reoccurrence has begun recently   ROS      Objective:  Physical Exam  Giller status found to be intact with patient found to have inflammation of the fifth MPJ left that is painful when pressed and is also noted to have incurvated lateral border third left that is painful when pressed and lesions underneath the fifth metatarsal head base and underneath the third toe left     Assessment:  Ingrown toenail deformity left third toe lateral border with pain and incurvation of the border with no indication of infection with inflammatory capsulitis fifth MPJ and porokeratotic lesions x3     Plan:  H&P reviewed all conditions and did go ahead today for x-ray of the foot.  I went ahead and did an injection of the fifth MPJ after proximal numbing with 3 mg Dexasone Kenalog and went ahead and did a nerve block of the third digit 60 mg like Marcaine mixture.  Sterile prep applied to the toe patient read and signed consent form and I went ahead remove the lateral border exposed matrix and applied phenol 3 applications 30 seconds followed by alcohol lavage and sterile dressing.  I debrided lesions with no iatrogenic bleeding and patient will be seen back as needed was instructed on soaks and to leave dressing on 24 hours

## 2019-12-28 NOTE — Patient Instructions (Signed)

## 2020-01-01 IMAGING — DX DG FOOT COMPLETE 3+V*L*
3 series · 3 of 3 positions shown · non-contrast
Comparison: 04/04/2017

CLINICAL DATA: Left foot pain laterally

EXAM:
LEFT FOOT - COMPLETE 3+ VIEW

[foot ap]
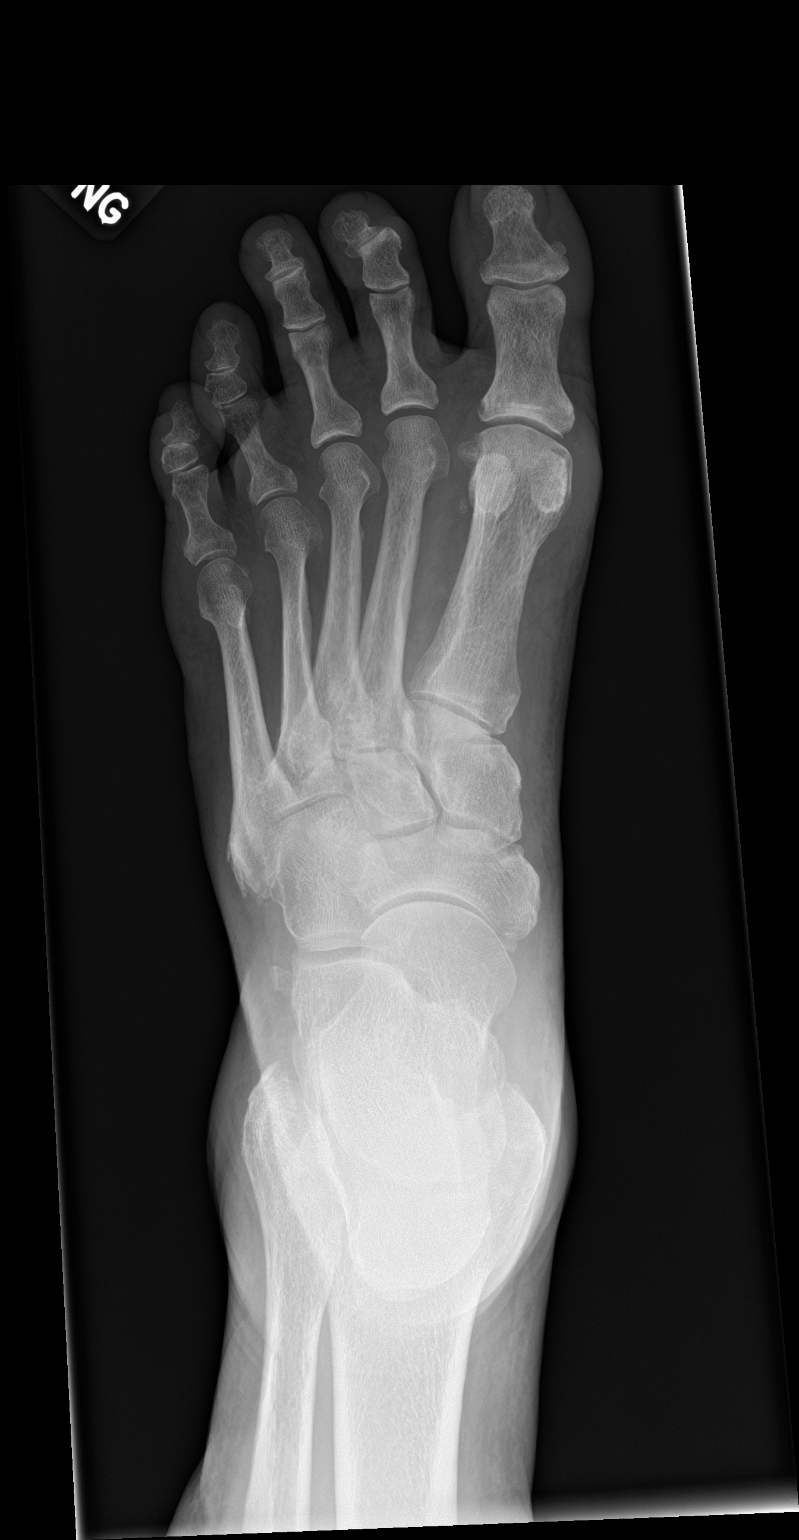

[foot obl]
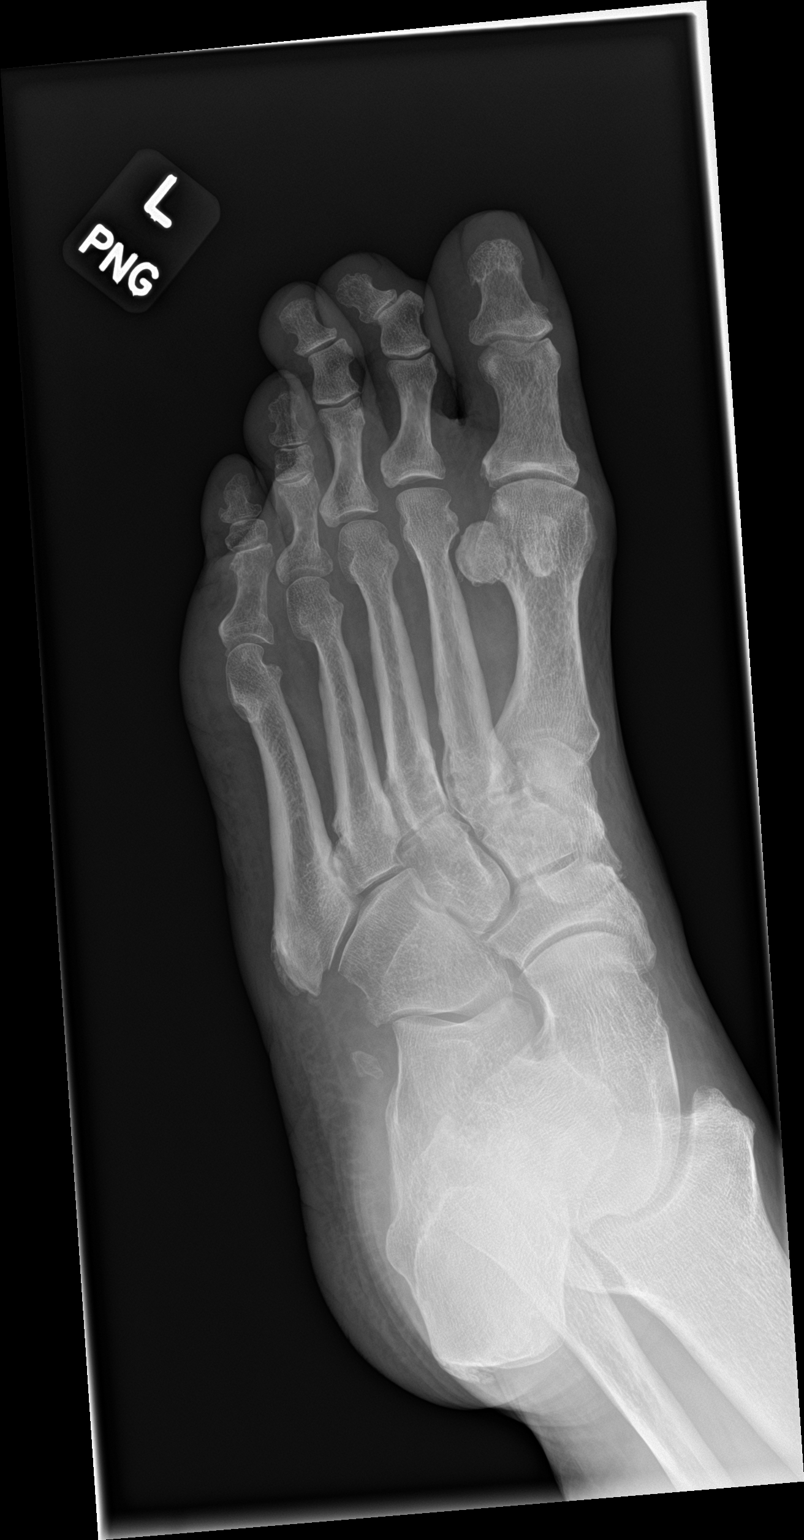

[foot lat]
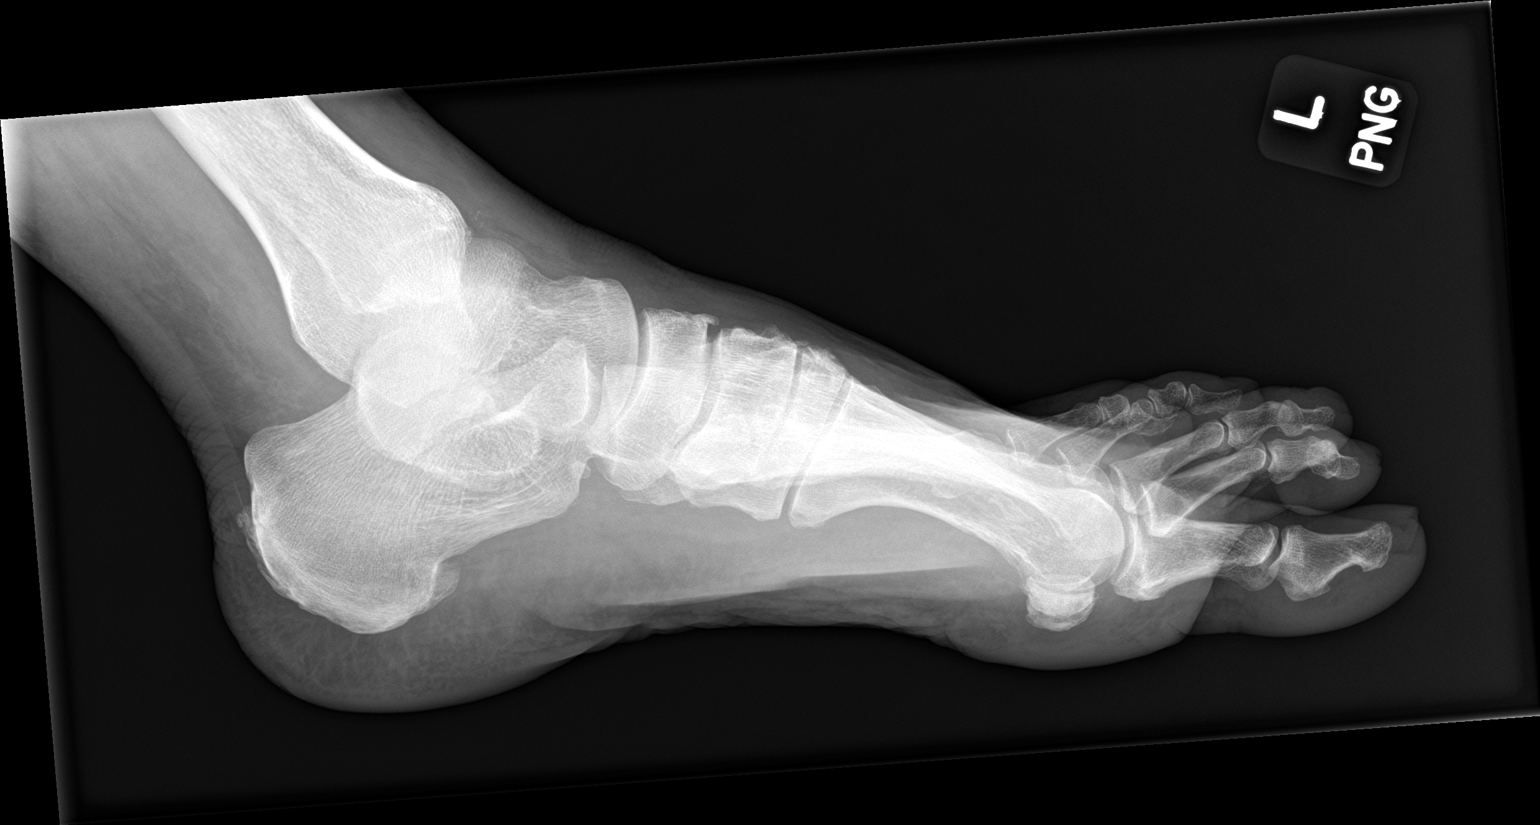

[3 of 3 positions shown; findings below may reference images not displayed]

FINDINGS: Negative for fracture.

Mild midfoot degenerative change. Mild calcaneal spurring. Lateral
subluxation of the distal second phalanx unchanged from the prior
study
IMPRESSION: Degenerative changes as above.  No acute abnormality.

## 2020-01-06 ENCOUNTER — Other Ambulatory Visit: Payer: Self-pay | Admitting: Internal Medicine

## 2020-01-10 DIAGNOSIS — L570 Actinic keratosis: Secondary | ICD-10-CM | POA: Diagnosis not present

## 2020-01-10 DIAGNOSIS — L819 Disorder of pigmentation, unspecified: Secondary | ICD-10-CM | POA: Diagnosis not present

## 2020-01-18 ENCOUNTER — Other Ambulatory Visit: Payer: Self-pay | Admitting: Internal Medicine

## 2020-01-18 MED ORDER — HYDROCODONE-ACETAMINOPHEN 7.5-325 MG PO TABS: 1.0000 | ORAL_TABLET | Freq: Four times a day (QID) | ORAL | 0 refills | Status: DC | PRN

## 2020-02-01 ENCOUNTER — Ambulatory Visit (INDEPENDENT_AMBULATORY_CARE_PROVIDER_SITE_OTHER): Payer: Medicare Other | Admitting: *Deleted

## 2020-02-01 DIAGNOSIS — I5022 Chronic systolic (congestive) heart failure: Secondary | ICD-10-CM

## 2020-02-01 LAB — CUP PACEART REMOTE DEVICE CHECK
Battery Remaining Longevity: 94 mo
Battery Remaining Percentage: 95.5 %
Battery Voltage: 2.99 V
Brady Statistic AP VP Percent: 22 %
Brady Statistic AP VS Percent: 1 %
Brady Statistic AS VP Percent: 77 %
Brady Statistic AS VS Percent: 1.1 %
Brady Statistic RA Percent Paced: 21 %
Date Time Interrogation Session: 20210219020010
Implantable Lead Implant Date: 20190813
Implantable Lead Implant Date: 20190813
Implantable Lead Implant Date: 20190813
Implantable Lead Location: 753858
Implantable Lead Location: 753859
Implantable Lead Location: 753860
Implantable Pulse Generator Implant Date: 20190813
Lead Channel Impedance Value: 430 Ohm
Lead Channel Impedance Value: 450 Ohm
Lead Channel Impedance Value: 800 Ohm
Lead Channel Pacing Threshold Amplitude: 0.5 V
Lead Channel Pacing Threshold Amplitude: 0.625 V
Lead Channel Pacing Threshold Amplitude: 1.25 V
Lead Channel Pacing Threshold Pulse Width: 0.5 ms
Lead Channel Pacing Threshold Pulse Width: 0.5 ms
Lead Channel Pacing Threshold Pulse Width: 0.5 ms
Lead Channel Sensing Intrinsic Amplitude: 2.4 mV
Lead Channel Sensing Intrinsic Amplitude: 9.2 mV
Lead Channel Setting Pacing Amplitude: 1.625
Lead Channel Setting Pacing Amplitude: 2.25 V
Lead Channel Setting Pacing Amplitude: 2.5 V
Lead Channel Setting Pacing Pulse Width: 0.5 ms
Lead Channel Setting Pacing Pulse Width: 0.5 ms
Lead Channel Setting Sensing Sensitivity: 2 mV
Pulse Gen Model: 3562
Pulse Gen Serial Number: 9043864

## 2020-02-01 NOTE — Progress Notes (Signed)
ICD Remote  

## 2020-02-10 ENCOUNTER — Telehealth: Payer: Self-pay | Admitting: Internal Medicine

## 2020-02-11 DIAGNOSIS — L578 Other skin changes due to chronic exposure to nonionizing radiation: Secondary | ICD-10-CM | POA: Diagnosis not present

## 2020-02-12 ENCOUNTER — Other Ambulatory Visit: Payer: Self-pay | Admitting: Internal Medicine

## 2020-02-12 ENCOUNTER — Encounter: Payer: Self-pay | Admitting: Internal Medicine

## 2020-02-12 MED ORDER — HYDROCODONE-ACETAMINOPHEN 7.5-325 MG PO TABS: 1.0000 | ORAL_TABLET | Freq: Four times a day (QID) | ORAL | 0 refills | Status: DC | PRN

## 2020-02-12 NOTE — Telephone Encounter (Signed)
He is due for a fu - needs to be seen Q 6 months. Please call him to schedule

## 2020-02-12 NOTE — Telephone Encounter (Signed)
Last OV 08/01/19 Next OV NA Last RF 01/09/20

## 2020-02-12 NOTE — Telephone Encounter (Signed)
Last RF 01/18/20

## 2020-02-13 NOTE — Telephone Encounter (Signed)
LVM for patient to return call to schedule a follow up.

## 2020-02-14 ENCOUNTER — Encounter: Payer: Self-pay | Admitting: Internal Medicine

## 2020-02-14 NOTE — Progress Notes (Signed)
Subjective:    Patient ID: Jeffery [name], male    DOB: [birth year], 79 y.o.   MRN: [id number]  HPI The patient is here for follow up of their chronic medical problems, including chronic pain, diabetes  Diabetes: He is taking his medication daily as prescribed. He is compliant with a diabetic diet.  He monitors his sugars and they have been well controlled.   Hypertension: He is taking her medication daily. He is compliant with a low sodium diet.   He is exercising.  His blood pressure has been well controlled at home  Hyperlipidemia: He is taking his medication daily. He is compliant with a low fat/cholesterol diet.        The patient is here for follow up for chronic pain management.  Indication for chronic opioid: chronic arthritis, multiple joints Medication and dose: norco 7.5-325 mg    1 tab every 6 hours as needed # pills per month: 30  Last UDS date: 08/01/2019 Pain contract signed (Y/N): 03/2018-signed again today Date narcotic database last reviewed (include red flags): 02/15/2020  Pain assessment:  Pain intensity: Moderate depending on activity Amount of pain relief none with medication:  good Use of pain medications: taking medication appropriately-never takes more than 2 in 1 day and that is not, Side effects:   None Sleep:   Good with ativan Mood: good - denies depression and anxiety Functional/social activities: continues to be active in home setting       Medications and allergies reviewed with patient and updated if appropriate.  Patient Active Problem List   Diagnosis Date Noted  . Peroneal mononeuropathy, left 12/25/2018  . Diabetic polyneuropathy associated with type 2 diabetes mellitus ([place]) 12/25/2018  . Cylindrical bronchiectasis ([place]) 12/20/2018  . Viral pneumonia 12/12/2018  . Community acquired pneumonia 11/18/2018  . Folliculitis [id number]  . Chronic systolic heart failure ([place]) 07/25/2018  . Encounter for chronic pain management  04/13/2017  . Osteoarthritis 04/12/2017  . Chronic combined systolic and diastolic CHF (congestive heart failure) ([place]) 01/24/2015  . Left bundle branch block 11/27/2013  . Essential hypertension 11/27/2013  . GERD (gastroesophageal reflux disease) 02/27/2013  . Type 2 diabetes mellitus with vascular disease ([place]) 04/09/2009  . HLD (hyperlipidemia) 09/06/2008  . Coronary atherosclerosis 09/06/2008  . Asthma 09/06/2008    Current Outpatient Medications on File Prior to Visit  Medication Sig Dispense Refill  . ACCU-CHEK AVIVA PLUS test strip USE AS INSTRUCTED TO TEST BLOOD SUGAR 6 TIMES DAILY. DX CODE: E11.59 500 strip 4  . ACCU-CHEK SOFTCLIX LANCETS lancets Use to test blood sugar daily as directed. 100 each 3  . albuterol (PROAIR HFA) 108 (90 BASE) MCG/ACT inhaler Inhale 2 puffs into the lungs every 6 (six) hours as needed for wheezing or shortness of breath.     [name] albuterol (PROVENTIL) (2.5 MG/3ML) 0.083% nebulizer solution Take 3 mLs (2.5 mg total) by nebulization every 6 (six) hours as needed for wheezing or shortness of breath. 150 mL 5  . aspirin 81 MG tablet Take 81 mg by mouth at bedtime.      [name] BYSTOLIC 5 MG tablet TAKE 1 TABLET BY MOUTH  DAILY 90 tablet 3  . Continuous Blood Gluc Receiver (FREESTYLE LIBRE 14 DAY READER) DEVI 1 Device by Does not apply route every 14 (fourteen) days. 1 Device 0  . Continuous Blood Gluc Sensor (FREESTYLE LIBRE 14 DAY SENSOR) MISC 1 EACH BY DOES NOT APPLY ROUTE EVERY 14 (FOURTEEN) DAYS. 1 each 5  . fexofenadine (  ALLEGRA) 180 MG tablet Take 180 mg by mouth daily as needed for allergies.     [name] HYDROcodone-acetaminophen (NORCO) 7.5-325 MG tablet Take 1 tablet by mouth every 6 (six) hours as needed (for chronic joint pain). Need office visit for more refills 30 tablet 0  . ibuprofen (ADVIL,MOTRIN) 800 MG tablet Take 800 mg by mouth every 8 (eight) hours as needed for headache or moderate pain.     [name] ipratropium (ATROVENT) 0.02 % nebulizer solution Take 2.5  mLs (0.5 mg total) by nebulization every 6 (six) hours. 75 mL 3  . levalbuterol (XOPENEX) 1.25 MG/3ML nebulizer solution Take 1.25 mg by nebulization every 6 (six) hours. 120 mL 3  . LORazepam (ATIVAN) 1 MG tablet TAKE 1/2 TO 1 TABLET BY MOUTH DAILY AS NEEDED. 30 tablet 1  . metFORMIN (GLUCOPHAGE) 500 MG tablet TAKE 1 TABLET BY MOUTH  DAILY WITH BREAKFAST 90 tablet 1  . montelukast (SINGULAIR) 10 MG tablet TAKE 1 TABLET BY MOUTH AT  BEDTIME 90 tablet 3  . Multiple Vitamin (MULTIVITAMIN WITH MINERALS) TABS tablet Take 1 tablet by mouth daily.    . nitroGLYCERIN (NITROSTAT) 0.4 MG SL tablet Place 0.4 mg under the tongue every 5 (five) minutes x 3 doses as needed for chest pain.    [name] Respiratory Therapy Supplies (FLUTTER) DEVI Twice a day and prn as needed, may increase if feeling worse 1 each 0  . rosuvastatin (CRESTOR) 10 MG tablet TAKE ONE TABLET BY MOUTH DAILY ON MONDAY, WEDNESDAY AND FRIDAY 45 tablet 2  . SYMBICORT 160-4.5 MCG/ACT inhaler USE 1 TO 2 INHALATIONS BY  MOUTH EVERY 12 HOURS AS  NEEDED 30.6 g 3  . vardenafil (LEVITRA) 20 MG tablet TAKE 1 TABLET BY MOUTH AS DIRECTED. CAN NOT BE TAKEN WITH NITROGLYCERIN. NOT COVERED BY INSURANCE 6 tablet 2  . zinc gluconate 50 MG tablet Take 50 mg by mouth daily.    . famotidine (PEPCID) 20 MG tablet Take 1 tablet (20 mg total) by mouth daily. 30 tablet 0   No current facility-administered medications on file prior to visit.    Past Medical History:  Diagnosis Date  . Arthritis    "thumbs" (07/25/2018)  . Asthma   . CHF (congestive heart failure) ([place])   . Chronic bronchitis ([place])   . Environmental allergies   . GERD (gastroesophageal reflux disease)   . Headache    "seasonal; w/environmental allergies" (07/25/2018)  . History of blood transfusion    "when I had laminectomy" (07/25/2018)  . HTN (hypertension)   . Hyperlipidemia   . LBBB (left bundle branch block) 1999  . Metatarsal bone fracture 03/07/2018  . Myocardial infarction [facility])     "was told I've had an old MI; probably in the 48s" (07/25/2018)  . Pneumonia    "as a child, age 13; viral pneumonia 3 times in the last 10 years" (07/25/2018)  . Presence of permanent cardiac pacemaker 07/25/2018  . Seasonal allergies   . Sleep apnea    "wife says I do" (07/25/2018)  . Type II diabetes mellitus ([place])     Past Surgical History:  Procedure Laterality Date  . BACK SURGERY    . BIV PACEMAKER INSERTION CRT-P  07/25/2018  . BIV PACEMAKER INSERTION CRT-P N/A 07/25/2018   Procedure: BIV PACEMAKER INSERTION CRT-P;  Surgeon: Evans Lance, MD;  Location: [place] [facility];  Service: Cardiovascular;  Laterality: N/A;  . CARDIAC CATHETERIZATION  2003   Dr Pernell Dupre; 85 % R circumflex obstruction  .  CARPAL TUNNEL RELEASE Left 10/11/2014   Procedure: LEFT CARPAL TUNNEL RELEASE;  Surgeon: Roseanne Kaufman, MD;  Location: [place];  Service: Orthopedics;  Laterality: Left;  . COLONOSCOPY W/ BIOPSIES AND POLYPECTOMY  2018  . INGUINAL HERNIA REPAIR Right 1948  . INGUINAL HERNIA REPAIR Left 1988  . LUMBAR LAMINECTOMY  1984  . MINOR CARPAL TUNNEL Right 11/22/2014   Procedure: RIGHT LIMITED OPEN CARPAL TUNNEL RELEASE;  Surgeon: Roseanne Kaufman, MD;  Location: [place];  Service: Orthopedics;  Laterality: Right;  . TONSILLECTOMY  1958    Social History   Socioeconomic History  . Marital status: Married    Spouse name: Not on file  . Number of children: 3  . Years of education: Not on file  . Highest education level: Professional school degree (e.g., MD, DDS, DVM, JD)  Occupational History  . 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 Smoking status: Former Smoker    Packs/day: 2.00    Years: 2.00    Pack years: 4.00    Types: Cigarettes    Quit date: 12/14/1963    Years since quitting: 56.2  . Smokeless tobacco: Never Used  Substance and Sexual Activity  . Alcohol use: Not Currently    Alcohol/week: 2.0  standard drinks    Types: 2 Glasses of wine per week    Comment: occasional  . Drug use: Never  . Sexual activity: Yes  Other Topics Concern  . Not on file  Social History Narrative   Lives with wife in a 2 story home.  Has 3 daughters.  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     Social Determinants of Health   Financial Resource Strain:   . Difficulty of Paying Living Expenses: Not on file  Food Insecurity:   . Worried About Charity fundraiser in the Last Year: Not on file  . Ran Out of Food in the Last Year: Not on file  Transportation Needs:   . Lack of Transportation (Medical): Not on file  . Lack of Transportation (Non-Medical): Not on file  Physical Activity:   . Days of Exercise per Week: Not on file  . Minutes of Exercise per Session: Not on file  Stress:   . Feeling of Stress : Not on file  Social Connections:   . Frequency of Communication with Friends and Family: Not on file  . Frequency of Social Gatherings with Friends and Family: Not on file  . Attends Religious Services: Not on file  . Active Member of Clubs or Organizations: Not on file  . Attends Archivist Meetings: Not on file  . Marital Status: Not on file    Family History  Problem Relation Age of Onset  . Heart attack Father        33s  . Colon polyps Father   . Heart failure Mother        90s  . Subarachnoid hemorrhage Mother 54  . Hypertension Mother   . Subarachnoid hemorrhage Paternal Grandfather 55  . Diabetes Maternal Aunt   . Colon cancer Neg Hx     Review of Systems  Constitutional: Negative for chills and fever.  Respiratory: Positive for cough (chronic) and wheezing. Negative for shortness of breath.   Cardiovascular: Negative for chest pain, palpitations and leg swelling.  Neurological: Negative for dizziness, light-headedness, numbness and headaches.       Objective:   Vitals:   02/15/20 0800  BP: (!) 142/80  Pulse: 66  Resp: 16  Temp: 98.4 F (36.9 C)  SpO2:  99%   BP Readings from Last 3 Encounters:  02/15/20 (!) 142/80  08/03/19 136/80  08/01/19 128/78   Wt Readings from Last 3 Encounters:  02/15/20 195 lb (88.5 kg)  08/03/19 183 lb (83 kg)  08/01/19 185 lb (83.9 kg)   Body mass index is 28.38 kg/m.   Physical Exam    Constitutional: Appears well-developed and well-nourished. No distress.  HENT:  Head: Normocephalic and atraumatic.  Neck: Neck supple. No tracheal deviation present. No thyromegaly present.  No cervical lymphadenopathy Cardiovascular: Normal rate, regular rhythm and normal heart sounds.   No murmur heard. No carotid bruit .  No edema Pulmonary/Chest: Effort normal and breath sounds normal. No respiratory distress. No has no wheezes. No rales. Abdomen: Soft, nontender, nondistended Skin: Skin is warm and dry. Not diaphoretic.  Psychiatric: Normal mood and affect. Behavior is normal.      Assessment & Plan:    See Problem List for Assessment and Plan of chronic medical problems.    This visit occurred during the SARS-CoV-2 public health emergency.  Safety protocols were in place, including screening questions prior to the visit, additional usage of staff PPE, and extensive cleaning of exam room while observing appropriate contact time as indicated for disinfecting solutions.

## 2020-02-14 NOTE — Patient Instructions (Addendum)
  Blood work was ordered.   ° ° °Medications reviewed and updated.  Changes include :   none ° ° ° °Please followup in 6 months ° ° °

## 2020-02-15 ENCOUNTER — Encounter: Payer: Self-pay | Admitting: Internal Medicine

## 2020-02-15 ENCOUNTER — Other Ambulatory Visit: Payer: Self-pay

## 2020-02-15 ENCOUNTER — Ambulatory Visit (INDEPENDENT_AMBULATORY_CARE_PROVIDER_SITE_OTHER): Payer: Medicare Other | Admitting: Internal Medicine

## 2020-02-15 VITALS — BP 142/80 | HR 66 | Temp 98.4°F | Resp 16 | Ht 69.5 in | Wt 195.0 lb

## 2020-02-15 DIAGNOSIS — E782 Mixed hyperlipidemia: Secondary | ICD-10-CM | POA: Diagnosis not present

## 2020-02-15 DIAGNOSIS — K219 Gastro-esophageal reflux disease without esophagitis: Secondary | ICD-10-CM

## 2020-02-15 DIAGNOSIS — Z0289 Encounter for other administrative examinations: Secondary | ICD-10-CM | POA: Insufficient documentation

## 2020-02-15 DIAGNOSIS — E1159 Type 2 diabetes mellitus with other circulatory complications: Secondary | ICD-10-CM

## 2020-02-15 DIAGNOSIS — I10 Essential (primary) hypertension: Secondary | ICD-10-CM

## 2020-02-15 DIAGNOSIS — G8929 Other chronic pain: Secondary | ICD-10-CM

## 2020-02-15 DIAGNOSIS — M159 Polyosteoarthritis, unspecified: Secondary | ICD-10-CM

## 2020-02-15 LAB — COMPREHENSIVE METABOLIC PANEL
ALT: 23 U/L (ref 0–53)
AST: 22 U/L (ref 0–37)
Albumin: 4.1 g/dL (ref 3.5–5.2)
Alkaline Phosphatase: 72 U/L (ref 39–117)
BUN: 18 mg/dL (ref 6–23)
CO2: 30 mEq/L (ref 19–32)
Calcium: 9.8 mg/dL (ref 8.4–10.5)
Chloride: 104 mEq/L (ref 96–112)
Creatinine, Ser: 1.05 mg/dL (ref 0.40–1.50)
GFR: 68.58 mL/min (ref >60.00)
Glucose, Bld: 122 mg/dL — ABNORMAL HIGH (ref 70–99)
Potassium: 4.3 mEq/L (ref 3.5–5.1)
Sodium: 140 mEq/L (ref 135–145)
Total Bilirubin: 0.4 mg/dL (ref 0.2–1.2)
Total Protein: 7 g/dL (ref 6.0–8.3)

## 2020-02-15 LAB — HEMOGLOBIN A1C: Hgb A1c MFr Bld: 6.8 % — ABNORMAL HIGH (ref 4.6–6.5)

## 2020-02-15 NOTE — Assessment & Plan Note (Addendum)
Chronic Lipids well controlled Continue daily statin Regular exercise and healthy diet encouraged  

## 2020-02-15 NOTE — Assessment & Plan Note (Addendum)
Chronic BP well controlled-he checks it regularly at home and it has been well controlled Current regimen effective and well tolerated Continue current medications at current doses cmp

## 2020-02-15 NOTE — Assessment & Plan Note (Signed)
Chronic pain management for chronic arthritis of multiple joints Takes Norco 7.5-325 mg as needed.  He takes medication appropriately and it provides good relief of his pain Up-to-date with UDS Due for new pain contract-signed today [place] controlled substance database checked, no concerning findings Follow-up in 6 months

## 2020-02-15 NOTE — Assessment & Plan Note (Addendum)
Chronic Well controlled at home Check a1c Low sugar / carb diet Continue regular exercise

## 2020-02-15 NOTE — Assessment & Plan Note (Signed)
Chronic GERD controlled Continue daily medication  

## 2020-02-15 NOTE — Assessment & Plan Note (Signed)
Chronic Has arthritis multiple joints and it flares depending on his activities Takes Norco as needed and the medication provides good relief He is taking the medication appropriately and has no side effects We will continue current pain regimen

## 2020-02-17 ENCOUNTER — Encounter: Payer: Self-pay | Admitting: Internal Medicine

## 2020-03-12 ENCOUNTER — Other Ambulatory Visit: Payer: Self-pay | Admitting: Internal Medicine

## 2020-03-13 ENCOUNTER — Other Ambulatory Visit: Payer: Self-pay | Admitting: Internal Medicine

## 2020-03-17 ENCOUNTER — Other Ambulatory Visit: Payer: Self-pay | Admitting: Internal Medicine

## 2020-03-17 MED ORDER — HYDROCODONE-ACETAMINOPHEN 7.5-325 MG PO TABS: 1.0000 | ORAL_TABLET | Freq: Four times a day (QID) | ORAL | 0 refills | Status: DC | PRN

## 2020-03-27 ENCOUNTER — Ambulatory Visit (INDEPENDENT_AMBULATORY_CARE_PROVIDER_SITE_OTHER): Payer: Medicare Other

## 2020-03-27 DIAGNOSIS — Z0000 Encounter for general adult medical examination without abnormal findings: Secondary | ICD-10-CM | POA: Diagnosis not present

## 2020-03-27 NOTE — Patient Instructions (Addendum)
Jeffery [name] , Thank you for taking time to come for your Medicare Wellness Visit. I appreciate your ongoing commitment to your health goals. Please review the following plan we discussed and let me know if I can assist you in the future.   Screening recommendations/referrals: Colorectal Screening: 05/06/2016  Vision and Dental Exams: Recommended annual ophthalmology exams for early detection of glaucoma and other disorders of the eye Recommended annual dental exams for proper oral hygiene  Vaccinations: Influenza vaccine: 08/01/2019 Pneumococcal vaccine: Prevnar 03/04/2016 and Pneumovax 04/12/2017 Tdap vaccine: 05/13/2008; overdue; Due every 10 years. Shingles vaccine: Please call your insurance company to determine your out of pocket expense for the Shingrix vaccine. You may receive this vaccine at your local pharmacy. Covid vaccine: Moderna dose 1 and 2; completed.  Advanced directives: Advance directives discussed with you today. Please bring a copy of your POA (Power of Arley) and/or Living Will to your next appointment.  Goals:  Recommend to drink at least 6-8 8oz glasses of water per day.  Recommend to exercise for at least 150 minutes per week.  Recommend to remove any items from the home that may cause slips or trips.  Recommend to decrease portion sizes by eating 3 small healthy meals and at least 2 healthy snacks per day.  Recommend to begin DASH diet as directed below  Recommend to continue efforts to reduce smoking habits until no longer smoking. Smoking Cessation literature is attached below.  Next appointment: Please schedule your Annual Wellness Visit with your Nurse Health Advisor in one year.  Preventive Care 64 Years and Older, Male Preventive care refers to lifestyle choices and visits with your health care provider that can promote health and wellness. What does preventive care include?  A yearly physical exam. This is also called an annual well  check.  Dental exams once or twice a year.  Routine eye exams. Ask your health care provider how often you should have your eyes checked.  Personal lifestyle choices, including:  Daily care of your teeth and gums.  Regular physical activity.  Eating a healthy diet.  Avoiding tobacco and drug use.  Limiting alcohol use.  Practicing safe sex.  Taking low doses of aspirin every day if recommended by your health care provider..  Taking vitamin and mineral supplements as recommended by your health care provider. What happens during an annual well check? The services and screenings done by your health care provider during your annual well check will depend on your age, overall health, lifestyle risk factors, and family history of disease. Counseling  Your health care provider may ask you questions about your:  Alcohol use.  Tobacco use.  Drug use.  Emotional well-being.  Home and relationship well-being.  Sexual activity.  Eating habits.  History of falls.  Memory and ability to understand (cognition).  Work and work Statistician. Screening  You may have the following tests or measurements:  Height, weight, and BMI.  Blood pressure.  Lipid and cholesterol levels. These may be checked every 5 years, or more frequently if you are over 54 years old.  Skin check.  Lung cancer screening. You may have this screening every year starting at age 30 if you have a 30-pack-year history of smoking and currently smoke or have quit within the past 15 years.  Fecal occult blood test (FOBT) of the stool. You may have this test every year starting at age 22.  Flexible sigmoidoscopy or colonoscopy. You may have a sigmoidoscopy every 5 years or a  colonoscopy every 10 years starting at age 55.  Prostate cancer screening. Recommendations will vary depending on your family history and other risks.  Hepatitis C blood test.  Hepatitis B blood test.  Sexually transmitted disease  (STD) testing.  Diabetes screening. This is done by checking your blood sugar (glucose) after you have not eaten for a while (fasting). You may have this done every 1-3 years.  Abdominal aortic aneurysm (AAA) screening. You may need this if you are a current or former smoker.  Osteoporosis. You may be screened starting at age 48 if you are at high risk. Talk with your health care provider about your test results, treatment options, and if necessary, the need for more tests. Vaccines  Your health care provider may recommend certain vaccines, such as:  Influenza vaccine. This is recommended every year.  Tetanus, diphtheria, and acellular pertussis (Tdap, Td) vaccine. You may need a Td booster every 10 years.  Zoster vaccine. You may need this after age 88.  Pneumococcal 13-valent conjugate (PCV13) vaccine. One dose is recommended after age 37.  Pneumococcal polysaccharide (PPSV23) vaccine. One dose is recommended after age 10. Talk to your health care provider about which screenings and vaccines you need and how often you need them. This information is not intended to replace advice given to you by your health care provider. Make sure you discuss any questions you have with your health care provider. Document Released: 12/26/2015 Document Revised: 08/18/2016 Document Reviewed: 09/30/2015 Elsevier Interactive Patient Education  2017 [place] Prevention in the Home Falls can cause injuries. They can happen to people of all ages. There are many things you can do to make your home safe and to help prevent falls. What can I do on the outside of my home?  Regularly fix the edges of walkways and driveways and fix any cracks.  Remove anything that might make you trip as you walk through a door, such as a raised step or threshold.  Trim any bushes or trees on the path to your home.  Use bright outdoor lighting.  Clear any walking paths of anything that might make someone trip,  such as rocks or tools.  Regularly check to see if handrails are loose or broken. Make sure that both sides of any steps have handrails.  Any raised decks and porches should have guardrails on the edges.  Have any leaves, snow, or ice cleared regularly.  Use sand or salt on walking paths during winter.  Clean up any spills in your garage right away. This includes oil or grease spills. What can I do in the bathroom?  Use night lights.  Install grab bars by the toilet and in the tub and shower. Do not use towel bars as grab bars.  Use non-skid mats or decals in the tub or shower.  If you need to sit down in the shower, use a plastic, non-slip stool.  Keep the floor dry. Clean up any water that spills on the floor as soon as it happens.  Remove soap buildup in the tub or shower regularly.  Attach bath mats securely with double-sided non-slip rug tape.  Do not have throw rugs and other things on the floor that can make you trip. What can I do in the bedroom?  Use night lights.  Make sure that you have a light by your bed that is easy to reach.  Do not use any sheets or blankets that are too big for your bed. They  should not hang down onto the floor.  Have a firm chair that has side arms. You can use this for support while you get dressed.  Do not have throw rugs and other things on the floor that can make you trip. What can I do in the kitchen?  Clean up any spills right away.  Avoid walking on wet floors.  Keep items that you use a lot in easy-to-reach places.  If you need to reach something above you, use a strong step stool that has a grab bar.  Keep electrical cords out of the way.  Do not use floor polish or wax that makes floors slippery. If you must use wax, use non-skid floor wax.  Do not have throw rugs and other things on the floor that can make you trip. What can I do with my stairs?  Do not leave any items on the stairs.  Make sure that there are  handrails on both sides of the stairs and use them. Fix handrails that are broken or loose. Make sure that handrails are as long as the stairways.  Check any carpeting to make sure that it is firmly attached to the stairs. Fix any carpet that is loose or worn.  Avoid having throw rugs at the top or bottom of the stairs. If you do have throw rugs, attach them to the floor with carpet tape.  Make sure that you have a light switch at the top of the stairs and the bottom of the stairs. If you do not have them, ask someone to add them for you. What else can I do to help prevent falls?  Wear shoes that:  Do not have high heels.  Have rubber bottoms.  Are comfortable and fit you well.  Are closed at the toe. Do not wear sandals.  If you use a stepladder:  Make sure that it is fully opened. Do not climb a closed stepladder.  Make sure that both sides of the stepladder are locked into place.  Ask someone to hold it for you, if possible.  Clearly mark and make sure that you can see:  Any grab bars or handrails.  First and last steps.  Where the edge of each step is.  Use tools that help you move around (mobility aids) if they are needed. These include:  Canes.  Walkers.  Scooters.  Crutches.  Turn on the lights when you go into a dark area. Replace any light bulbs as soon as they burn out.  Set up your furniture so you have a clear path. Avoid moving your furniture around.  If any of your floors are uneven, fix them.  If there are any pets around you, be aware of where they are.  Review your medicines with your doctor. Some medicines can make you feel dizzy. This can increase your chance of falling. Ask your doctor what other things that you can do to help prevent falls. This information is not intended to replace advice given to you by your health care provider. Make sure you discuss any questions you have with your health care provider. Document Released: 09/25/2009  Document Revised: 05/06/2016 Document Reviewed: 01/03/2015 Elsevier Interactive Patient Education  2017 [employer].

## 2020-03-27 NOTE — Progress Notes (Addendum)
This visit is being conducted via phone call due to the COVID-19 pandemic. This patient has given me verbal consent via phone to conduct this visit, patient states they are participating from their home address. Some vital signs may be absent or patient reported.   Patient identification: identified by name, DOB, and current address.  Location provider: [facility], Office Persons participating in the virtual visit: patient    Subjective:   Jeffery [name] is a 79 y.o. male who presents for Medicare Annual/Subsequent preventive examination.  Review of Systems:  No ROS. Medicare Wellness Visit Cardiac Risk Factors include: advanced age (>38men, >104 women);diabetes mellitus;dyslipidemia;hypertension;male gender  Sleep Patterns: No issues with falling sleep;feels rested on waking after 6-7 hours of sleep Home Safety/Smoke Alarms: Feels safe in home; Smoke alarms in place. Living environment: 2-story home; lives with wife and has three daughters and grandchildren. Seat Belt Safety/Bike Helmet: Wears seat belt.     Objective:    Vitals: There were no vitals taken for this visit.  There is no height or weight on file to calculate BMI.  Advanced Directives 12/11/2018 12/11/2018 07/25/2018 07/25/2018 04/27/2016 11/22/2014 11/20/2014  Does Patient Have a Medical Advance Directive? Yes Yes Yes Yes Yes Yes Yes  Type of Advance Directive Living will Living will [place];Living will [place];Living will [place];Living will Healthcare Power of [place];Living will  Does patient want to make changes to medical advance directive? No - Patient declined - No - Patient declined No - Patient declined - No - Patient declined -  Copy of [place] in Chart? - - No - copy requested No - copy requested - No - copy requested -    Tobacco Social History   Tobacco Use  Smoking Status Former Smoker   . Packs/day: 2.00  . Years: 2.00  . Pack years: 4.00  . Types: Cigarettes  . Quit date: 12/14/1963  . Years since quitting: 56.3  Smokeless Tobacco Never Used     Counseling given: Not Answered   Clinical Intake:  Pre-visit preparation completed: Yes  Pain : No/denies pain     Nutritional Risks: None Diabetes: Yes CBG done?: No Did pt. bring in CBG monitor from home?: No  How often do you need to have someone help you when you read instructions, pamphlets, or other written materials from your doctor or pharmacy?: 1 - Never What is the last grade level you completed in school?: Post Graduate Degree in Anesthesia  Interpreter Needed?: No  Information entered by :: Kineta Fudala N. Lowell Guitar, LPN  Past Medical History:  Diagnosis Date  . Arthritis    "thumbs" (07/25/2018)  . Asthma   . CHF (congestive heart failure) ([place])   . Chronic bronchitis ([place])   . Environmental allergies   . GERD (gastroesophageal reflux disease)   . Headache    "seasonal; w/environmental allergies" (07/25/2018)  . History of blood transfusion    "when I had laminectomy" (07/25/2018)  . HTN (hypertension)   . Hyperlipidemia   . LBBB (left bundle branch block) 1999  . Metatarsal bone fracture 03/07/2018  . Myocardial infarction [facility])    "was told I've had an old MI; probably in the 42s" (07/25/2018)  . Pneumonia    "as a child, age 25; viral pneumonia 3 times in the last 10 years" (07/25/2018)  . Presence of permanent cardiac pacemaker 07/25/2018  . Seasonal allergies   . Sleep apnea    "  wife says I do" (07/25/2018)  . Type II diabetes mellitus ([place])    Past Surgical History:  Procedure Laterality Date  . BACK SURGERY    . BIV PACEMAKER INSERTION CRT-P  07/25/2018  . BIV PACEMAKER INSERTION CRT-P N/A 07/25/2018   Procedure: BIV PACEMAKER INSERTION CRT-P;  Surgeon: Evans Lance, MD;  Location: [facility];  Service: Cardiovascular;  Laterality: N/A;  . CARDIAC CATHETERIZATION  2003   Dr  Pernell Dupre; 85 % R circumflex obstruction  . CARPAL TUNNEL RELEASE Left 10/11/2014   Procedure: LEFT CARPAL TUNNEL RELEASE;  Surgeon: Roseanne Kaufman, MD;  Location: [facility];  Service: Orthopedics;  Laterality: Left;  . COLONOSCOPY W/ BIOPSIES AND POLYPECTOMY  2018  . INGUINAL HERNIA REPAIR Right 1948  . INGUINAL HERNIA REPAIR Left 1988  . LUMBAR LAMINECTOMY  1984  . MINOR CARPAL TUNNEL Right 11/22/2014   Procedure: RIGHT LIMITED OPEN CARPAL TUNNEL RELEASE;  Surgeon: Roseanne Kaufman, MD;  Location: [facility];  Service: Orthopedics;  Laterality: Right;  . TONSILLECTOMY  1958   Family History  Problem Relation Age of Onset  . Heart attack Father        65s  . Colon polyps Father   . Heart failure Mother        90s  . Subarachnoid hemorrhage Mother 73  . Hypertension Mother   . Subarachnoid hemorrhage Paternal Grandfather 73  . Diabetes Maternal Aunt   . Colon cancer Neg Hx    Social History   Socioeconomic History  . Marital status: Married    Spouse name: Not on file  . Number of children: 3  . Years of education: Not on file  . Highest education level: Professional school degree (e.g., MD, DDS, DVM, JD)  Occupational History  . Occupation: retired Pension scheme manager: [employer]  Tobacco Use  . Smoking status: Former Smoker    Packs/day: 2.00    Years: 2.00    Pack years: 4.00    Types: Cigarettes    Quit date: 12/14/1963    Years since quitting: 56.3  . Smokeless tobacco: Never Used  Substance and Sexual Activity  . Alcohol use: Not Currently    Alcohol/week: 2.0 standard drinks    Types: 2 Glasses of wine per week    Comment: occasional  . Drug use: Never  . Sexual activity: Yes  Other Topics Concern  . Not on file  Social History Narrative   Lives with wife in a 2 story home.  Has 3 daughters.  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     Social Determinants of Health   Financial Resource Strain:   . Difficulty of  Paying Living Expenses:   Food Insecurity:   . Worried About Charity fundraiser in the Last Year:   . Arboriculturist in the Last Year:   Transportation Needs:   . Film/video editor (Medical):   [name] Lack of Transportation (Non-Medical):   Physical Activity:   . Days of Exercise per Week:   . Minutes of Exercise per Session:   Stress:   . Feeling of Stress :   Social Connections:   . Frequency of Communication with Friends and Family:   . Frequency of Social Gatherings with Friends and Family:   . Attends Religious Services:   . Active Member of Clubs or Organizations:   . Attends Archivist Meetings:   [name] Marital Status:  Outpatient Encounter Medications as of 03/27/2020  Medication Sig  . ACCU-CHEK AVIVA PLUS test strip USE AS INSTRUCTED TO TEST BLOOD SUGAR 6 TIMES DAILY. DX CODE: E11.59  . ACCU-CHEK SOFTCLIX LANCETS lancets Use to test blood sugar daily as directed.  [name] albuterol (PROAIR HFA) 108 (90 BASE) MCG/ACT inhaler Inhale 2 puffs into the lungs every 6 (six) hours as needed for wheezing or shortness of breath.   [name] albuterol (PROVENTIL) (2.5 MG/3ML) 0.083% nebulizer solution Take 3 mLs (2.5 mg total) by nebulization every 6 (six) hours as needed for wheezing or shortness of breath.  [name] aspirin 81 MG tablet Take 81 mg by mouth at bedtime.    [name] BYSTOLIC 5 MG tablet TAKE 1 TABLET BY MOUTH  DAILY  . Continuous Blood Gluc Receiver (FREESTYLE LIBRE 14 DAY READER) DEVI 1 Device by Does not apply route every 14 (fourteen) days.  . Continuous Blood Gluc Sensor (FREESTYLE LIBRE 14 DAY SENSOR) MISC 1 EACH BY DOES NOT APPLY ROUTE EVERY 14 (FOURTEEN) DAYS.  . famotidine (PEPCID) 20 MG tablet Take 1 tablet (20 mg total) by mouth daily.  . fexofenadine (ALLEGRA) 180 MG tablet Take 180 mg by mouth daily as needed for allergies.   [name] HYDROcodone-acetaminophen (NORCO) 7.5-325 MG tablet Take 1 tablet by mouth every 6 (six) hours as needed (for chronic joint pain). Need office visit  for more refills  . ibuprofen (ADVIL,MOTRIN) 800 MG tablet Take 800 mg by mouth every 8 (eight) hours as needed for headache or moderate pain.   [name] ipratropium (ATROVENT) 0.02 % nebulizer solution Take 2.5 mLs (0.5 mg total) by nebulization every 6 (six) hours.  [name] levalbuterol (XOPENEX) 1.25 MG/3ML nebulizer solution Take 1.25 mg by nebulization every 6 (six) hours.  [name] LORazepam (ATIVAN) 1 MG tablet TAKE 1/2 TO 1 TABLET BY MOUTH DAILY AS NEEDED.  . metFORMIN (GLUCOPHAGE) 500 MG tablet TAKE 1 TABLET BY MOUTH  DAILY WITH BREAKFAST  . montelukast (SINGULAIR) 10 MG tablet TAKE 1 TABLET BY MOUTH AT  BEDTIME  . Multiple Vitamin (MULTIVITAMIN WITH MINERALS) TABS tablet Take 1 tablet by mouth daily.  . nitroGLYCERIN (NITROSTAT) 0.4 MG SL tablet Place 0.4 mg under the tongue every 5 (five) minutes x 3 doses as needed for chest pain.  [name] Respiratory Therapy Supplies (FLUTTER) DEVI Twice a day and prn as needed, may increase if feeling worse  . rosuvastatin (CRESTOR) 10 MG tablet TAKE ONE TABLET BY MOUTH DAILY ON MONDAY, WEDNESDAY AND FRIDAY  . SYMBICORT 160-4.5 MCG/ACT inhaler USE 1 TO 2 INHALATIONS BY  MOUTH EVERY 12 HOURS AS  NEEDED  . vardenafil (LEVITRA) 20 MG tablet TAKE 1 TABLET BY MOUTH AS DIRECTED. CAN NOT BE TAKEN WITH NITROGLYCERIN. NOT COVERED BY INSURANCE  . zinc gluconate 50 MG tablet Take 50 mg by mouth daily.   No facility-administered encounter medications on file as of 03/27/2020.    Activities of Daily Living In your present state of health, do you have any difficulty performing the following activities: 03/27/2020  Hearing? N  Vision? N  Difficulty concentrating or making decisions? Y  Walking or climbing stairs? N  Dressing or bathing? N  Doing errands, shopping? N  Preparing Food and eating ? N  Using the Toilet? N  In the past six months, have you accidently leaked urine? N  Do you have problems with loss of bowel control? N  Managing your Medications? N  Managing your  Finances? N  Housekeeping or managing your Housekeeping? N  Some  recent data might be hidden    Patient Care Team: [name], MD as PCP - General (Internal Medicine) Belva Crome, MD as PCP - Cardiology (Cardiology)   Assessment:   This is a routine wellness examination for Jeffery [name].  Exercise Activities and Dietary recommendations Current Exercise Habits: The patient does not participate in regular exercise at present, Exercise limited by: None identified  Goals    . LDL CALC < 70     LDL-P number < 1000 nmol/L    . Patient Stated     Maintain active and lose 10 pounds.       Fall Risk Fall Risk  03/27/2020 02/15/2020 12/25/2018 03/18/2015  Falls in the past year? 0 1 0 No  Number falls in past yr: 0 1 0 -  Injury with Fall? 0 0 0 -  Risk for fall due to : No Fall Risks - - -  Follow up Education provided - Falls evaluation completed -   Is the patient's home free of loose throw rugs in walkways, pet beds, electrical cords, etc?   yes      Grab bars in the bathroom? yes      Handrails on the stairs?   yes      Adequate lighting?   yes   Depression Screen PHQ 2/9 Scores 03/27/2020 08/01/2019 03/18/2015  PHQ - 2 Score 0 0 0    Cognitive Function     6CIT Screen 03/27/2020  What Year? 0 points  What month? 0 points  What time? 0 points  Count back from 20 0 points  Months in reverse 0 points  Repeat phrase 0 points  Total Score 0    Immunization History  Administered Date(s) Administered  . Fluad Quad(high Dose 65+) 08/01/2019  . Influenza Split 08/30/2012  . Influenza, High Dose Seasonal PF 08/31/2017  . Influenza,inj,Quad PF,6+ Mos 10/09/2013, 09/12/2015  . Influenza,trivalent, recombinat, inj, PF 08/21/2018  . Influenza-Unspecified 09/29/2014, 09/02/2017  . Moderna SARS-COVID-2 Vaccination 01/25/2020  . Pneumococcal Conjugate-13 03/04/2016  . Pneumococcal Polysaccharide-23 04/12/2017  . Td 05/13/2008    Qualifies for Shingles Vaccine? Yes, will contact  insurance company to determine out of pocket expense for the Shingrix vaccine.  Will receive this vaccine at  local pharmacy.   Screening Tests Health Maintenance  Topic Date Due  . OPHTHALMOLOGY EXAM  Never done  . TETANUS/TDAP  05/13/2018  . FOOT EXAM  04/18/2020  . INFLUENZA VACCINE  07/13/2020  . URINE MICROALBUMIN  07/31/2020  . HEMOGLOBIN A1C  08/17/2020  . COLONOSCOPY  05/06/2021  . PNA vac Low Risk Adult  Completed   Cancer Screenings: Lung: Low Dose CT Chest recommended if Age 75-80 years, 30 pack-year currently smoking OR have quit w/in 15years. Patient does not qualify. Colorectal: yes     Plan:     Reviewed health maintenance screenings with patient today and relevant education, vaccines, and/or referrals were provided.    Continue doing brain stimulating activities (puzzles, reading, adult coloring books, staying active) to keep memory sharp.    Continue to eat heart healthy diet (full of fruits, vegetables, whole grains, lean protein, water--limit salt, fat, and sugar intake) and increase physical activity as tolerated.  I have personally reviewed and noted the following in the patient's chart:   . Medical and social history . Use of alcohol, tobacco or illicit drugs  . Current medications and supplements . Functional ability and status . Nutritional status . Physical activity . Advanced directives . List  of other physicians . Hospitalizations, surgeries, and ER visits in previous 12 months . Vitals . Screenings to include cognitive, depression, and falls . Referrals and appointments  In addition, I have reviewed and discussed with patient certain preventive protocols, quality metrics, and best practice recommendations. A written personalized care plan for preventive services as well as general preventive health recommendations were provided to patient.     Sheral Flow, LPN  [id number]  Nurse Health Advisor  Medical screening  examination/treatment/procedure(s) were performed by non-physician practitioner and as supervising physician I was immediately available for consultation/collaboration. I agree with above. Scarlette Calico, MD

## 2020-04-07 MED ORDER — NITROGLYCERIN 0.4 MG SL SUBL: 0.4000 mg | SUBLINGUAL_TABLET | SUBLINGUAL | 3 refills | Status: DC | PRN

## 2020-04-12 ENCOUNTER — Other Ambulatory Visit: Payer: Self-pay | Admitting: Internal Medicine

## 2020-04-13 ENCOUNTER — Other Ambulatory Visit: Payer: Self-pay | Admitting: Internal Medicine

## 2020-04-14 ENCOUNTER — Other Ambulatory Visit: Payer: Self-pay | Admitting: Internal Medicine

## 2020-04-14 ENCOUNTER — Encounter: Payer: Self-pay | Admitting: Internal Medicine

## 2020-04-14 MED ORDER — HYDROCODONE-ACETAMINOPHEN 7.5-325 MG PO TABS: 1.0000 | ORAL_TABLET | Freq: Four times a day (QID) | ORAL | 0 refills | Status: DC | PRN

## 2020-04-14 NOTE — Telephone Encounter (Signed)
Check [place] registry last filled 03/17/2020 Hydrocodone & last filled Lorazepam 03/13/20.Marland KitchenJohny Chess

## 2020-04-14 NOTE — Telephone Encounter (Signed)
Last RF was 03/17/20

## 2020-04-14 NOTE — Telephone Encounter (Signed)
Will not let me refuse duplicate

## 2020-04-14 NOTE — Telephone Encounter (Signed)
Last OV 02/15/20 Next OV 08/19/20 Last RF 03/13/20

## 2020-04-23 NOTE — Progress Notes (Signed)
Cardiology Office Note:    Date:  04/24/2020   ID:  Jeffery [name], DOB 04-01-1941, MRN [id number]  PCP:  [name], MD  Cardiologist:  Sinclair Grooms, MD   Referring MD: [name], MD   Chief Complaint  Patient presents with  . Congestive Heart Failure    History of Present Illness:    Jeffery [name] is a 79 y.o. male with a hx of  left bundle branch block, hypertension, hyperlipidemia,high grade AV block,obstructive sleep apnea, chronic combined systolic and diastolic HF,diabetes mellitus,obesityand recent hospital stay for dyspnea and hypoxia(2020).  He denies orthopnea, PND, chest pain, palpitations, and syncope.  If he squats and bends forward he has diaphragm pacing.  He is noted lower extremity swelling.  He denies orthopnea.  Past Medical History:  Diagnosis Date  . Arthritis    "thumbs" (07/25/2018)  . Asthma   . CHF (congestive heart failure) ([place])   . Chronic bronchitis ([place])   . Environmental allergies   . GERD (gastroesophageal reflux disease)   . Headache    "seasonal; w/environmental allergies" (07/25/2018)  . History of blood transfusion    "when I had laminectomy" (07/25/2018)  . HTN (hypertension)   . Hyperlipidemia   . LBBB (left bundle branch block) 1999  . Metatarsal bone fracture 03/07/2018  . Myocardial infarction [facility])    "was told I've had an old MI; probably in the 20s" (07/25/2018)  . Pneumonia    "as a child, age 43; viral pneumonia 3 times in the last 10 years" (07/25/2018)  . Presence of permanent cardiac pacemaker 07/25/2018  . Seasonal allergies   . Sleep apnea    "wife says I do" (07/25/2018)  . Type II diabetes mellitus ([place])     Past Surgical History:  Procedure Laterality Date  . BACK SURGERY    . BIV PACEMAKER INSERTION CRT-P  07/25/2018  . BIV PACEMAKER INSERTION CRT-P N/A 07/25/2018   Procedure: BIV PACEMAKER INSERTION CRT-P;  Surgeon: Evans Lance, MD;  Location: [place] [facility];  Service:  Cardiovascular;  Laterality: N/A;  . CARDIAC CATHETERIZATION  2003   Dr Pernell Dupre; 85 % R circumflex obstruction  . CARPAL TUNNEL RELEASE Left 10/11/2014   Procedure: LEFT CARPAL TUNNEL RELEASE;  Surgeon: Roseanne Kaufman, MD;  Location: [place];  Service: Orthopedics;  Laterality: Left;  . COLONOSCOPY W/ BIOPSIES AND POLYPECTOMY  2018  . INGUINAL HERNIA REPAIR Right 1948  . INGUINAL HERNIA REPAIR Left 1988  . LUMBAR LAMINECTOMY  1984  . MINOR CARPAL TUNNEL Right 11/22/2014   Procedure: RIGHT LIMITED OPEN CARPAL TUNNEL RELEASE;  Surgeon: Roseanne Kaufman, MD;  Location: [place];  Service: Orthopedics;  Laterality: Right;  . TONSILLECTOMY  1958    Current Medications: Current Meds  Medication Sig  . ACCU-CHEK AVIVA PLUS test strip USE AS INSTRUCTED TO TEST BLOOD SUGAR 6 TIMES DAILY. DX CODE: E11.59  . ACCU-CHEK SOFTCLIX LANCETS lancets Use to test blood sugar daily as directed.  [name] albuterol (PROAIR HFA) 108 (90 BASE) MCG/ACT inhaler Inhale 2 puffs into the lungs every 6 (six) hours as needed for wheezing or shortness of breath.   [name] albuterol (PROVENTIL) (2.5 MG/3ML) 0.083% nebulizer solution Take 3 mLs (2.5 mg total) by nebulization every 6 (six) hours as needed for wheezing or shortness of breath.  [name] aspirin 81 MG tablet Take 81 mg by mouth at bedtime.    [name] BYSTOLIC 5 MG tablet TAKE 1 TABLET  BY MOUTH  DAILY  . Continuous Blood Gluc Receiver (FREESTYLE LIBRE 14 DAY READER) DEVI 1 Device by Does not apply route every 14 (fourteen) days.  . Continuous Blood Gluc Sensor (FREESTYLE LIBRE 14 DAY SENSOR) MISC 1 EACH BY DOES NOT APPLY ROUTE EVERY 14 (FOURTEEN) DAYS.  . fexofenadine (ALLEGRA) 180 MG tablet Take 180 mg by mouth daily as needed for allergies.   [name] HYDROcodone-acetaminophen (NORCO) 7.5-325 MG tablet Take 1 tablet by mouth every 6 (six) hours as needed (for chronic joint pain). Need office visit for more refills  . ibuprofen (ADVIL,MOTRIN) 800 MG tablet  Take 800 mg by mouth every 8 (eight) hours as needed for headache or moderate pain.   [name] ipratropium (ATROVENT) 0.02 % nebulizer solution Take 2.5 mLs (0.5 mg total) by nebulization every 6 (six) hours.  [name] levalbuterol (XOPENEX) 1.25 MG/3ML nebulizer solution Take 1.25 mg by nebulization every 6 (six) hours.  [name] LORazepam (ATIVAN) 1 MG tablet TAKE 1/2 TO 1 TABLET BY MOUTH DAILY AS NEEDED.  . metFORMIN (GLUCOPHAGE) 500 MG tablet TAKE 1 TABLET BY MOUTH  DAILY WITH BREAKFAST  . montelukast (SINGULAIR) 10 MG tablet TAKE 1 TABLET BY MOUTH AT  BEDTIME  . Multiple Vitamin (MULTIVITAMIN WITH MINERALS) TABS tablet Take 1 tablet by mouth daily.  . nitroGLYCERIN (NITROSTAT) 0.4 MG SL tablet Place 1 tablet (0.4 mg total) under the tongue every 5 (five) minutes x 3 doses as needed for chest pain.  [name] Respiratory Therapy Supplies (FLUTTER) DEVI Twice a day and prn as needed, may increase if feeling worse  . rosuvastatin (CRESTOR) 10 MG tablet TAKE ONE TABLET BY MOUTH DAILY ON MONDAY, WEDNESDAY AND FRIDAY  . SYMBICORT 160-4.5 MCG/ACT inhaler USE 1 TO 2 INHALATIONS BY  MOUTH EVERY 12 HOURS AS  NEEDED  . vardenafil (LEVITRA) 20 MG tablet TAKE 1 TABLET BY MOUTH AS DIRECTED. CAN NOT BE TAKEN WITH NITROGLYCERIN. NOT COVERED BY INSURANCE  . zinc gluconate 50 MG tablet Take 50 mg by mouth daily.     Allergies:   Contrast media [iodinated diagnostic agents]   Social History   Socioeconomic History  . Marital status: Married    Spouse name: Not on file  . Number of children: 3  . Years of education: Not on file  . Highest education level: Professional school degree (e.g., MD, DDS, DVM, JD)  Occupational History  . Occupation: retired Pension scheme manager: [employer]  Tobacco Use  . Smoking status: Former Smoker    Packs/day: 2.00    Years: 2.00    Pack years: 4.00    Types: Cigarettes    Quit date: 12/14/1963    Years since quitting: 56.4  . Smokeless tobacco: Never Used  Substance and Sexual  Activity  . Alcohol use: Not Currently    Alcohol/week: 2.0 standard drinks    Types: 2 Glasses of wine per week    Comment: occasional  . Drug use: Never  . Sexual activity: Yes  Other Topics Concern  . Not on file  Social History Narrative   Lives with wife in a 2 story home.  Has 3 daughters.  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     Social Determinants of Health   Financial Resource Strain:   . Difficulty of Paying Living Expenses:   Food Insecurity:   . Worried About Charity fundraiser in the Last Year:   . Arboriculturist in the Last Year:   Transportation Needs:   .  Lack of Transportation (Medical):   [name] Lack of Transportation (Non-Medical):   Physical Activity:   . Days of Exercise per Week:   . Minutes of Exercise per Session:   Stress:   . Feeling of Stress :   Social Connections:   . Frequency of Communication with Friends and Family:   . Frequency of Social Gatherings with Friends and Family:   . Attends Religious Services:   . Active Member of Clubs or Organizations:   . Attends Archivist Meetings:   [name] Marital Status:      Family History: The patient's family history includes Colon polyps in his father; Diabetes in his maternal aunt; Heart attack in his father; Heart failure in his mother; Hypertension in his mother; Subarachnoid hemorrhage (age of onset: 17) in his mother; Subarachnoid hemorrhage (age of onset: 30) in his paternal grandfather. There is no history of Colon cancer.  ROS:   Please see the history of present illness.    Diaphragm pacing with squatting and leaning forward seasonal cough and wheezing.  Stable on therapy.  All other systems reviewed and are negative.  EKGs/Labs/Other Studies Reviewed:    The following studies were reviewed today:  ECHOCARDIOGRAM 2019: Study Conclusions   - Left ventricle: The cavity size was mildly dilated. Systolic  function was mildly to moderately reduced. The estimated ejection  fraction  was in the range of 40% to 45%. Although no diagnostic  regional wall motion abnormality was identified, this possibility  cannot be completely excluded on the basis of this study.  Features are consistent with a pseudonormal left ventricular  filling pattern, with concomitant abnormal relaxation and  increased filling pressure (grade 2 diastolic dysfunction).  - Ventricular septum: Septal motion showed abnormal function and  dyssynergy. These changes are consistent with a left bundle  branch block.  - Aortic valve: Trileaflet; normal thickness leaflets.  Transvalvular velocity was within the normal range. There was no  stenosis. There was no regurgitation.  - Mitral valve: Transvalvular velocity was within the normal range.  There was no evidence for stenosis. There was no regurgitation.  - Right ventricle: The cavity size was moderately dilated. Wall  thickness was normal. Systolic function was mildly reduced.  - Atrial septum: No defect or patent foramen ovale was identified.  - Tricuspid valve: There was trivial regurgitation.  - Pulmonic valve: There was no significant regurgitation.   Impressions:   - Moderately reduced systolic LV function. Septal motion consistent  with dyssynchrony. No significant change from prior.   EKG:  EKG atrial sensed, ventricular paced rhythm August 2020.  EKG not repeated today.  Recent Labs: 02/15/2020: ALT 23; BUN 18; Creatinine, Ser 1.05; Potassium 4.3; Sodium 140  Recent Lipid Panel    Component Value Date/Time   CHOL 122 09/14/2019 0936   CHOL 143 08/20/2014 0750   TRIG 85 09/14/2019 0936   TRIG 145 08/20/2014 0750   HDL 37 (L) 09/14/2019 0936   HDL 33 (L) 08/20/2014 0750   CHOLHDL 3.3 09/14/2019 0936   CHOLHDL 5 03/27/2018 0840   VLDL 24.4 03/27/2018 0840   LDLCALC 68 09/14/2019 0936   LDLCALC 81 08/20/2014 0750    Physical Exam:    VS:  BP 132/70   Pulse 75   Ht 5' 9.5" (1.765 m)   Wt 194 lb 6.4 oz (88.2  kg)   SpO2 98%   BMI 28.30 kg/m     Wt Readings from Last 3 Encounters:  04/24/20 194 lb 6.4  oz (88.2 kg)  02/15/20 195 lb (88.5 kg)  08/03/19 183 lb (83 kg)     GEN: Appears younger than stated age. No acute distress HEENT: Normal NECK: No JVD. LYMPHATICS: No lymphadenopathy CARDIAC: S4 gallop RRR without murmur, S3 gallop, but there is trace to 1+ ankle and shin bilateral edema. VASCULAR:  Normal Pulses. No bruits. RESPIRATORY:  Clear to auscultation without rales, wheezing or rhonchi  ABDOMEN: Soft, non-tender, non-distended, No pulsatile mass, MUSCULOSKELETAL: No deformity  SKIN: Warm and dry NEUROLOGIC:  Alert and oriented x 3 PSYCHIATRIC:  Normal affect   ASSESSMENT:    1. Coronary artery disease involving native coronary artery of native heart without angina pectoris   2. Left bundle branch block   3. Chronic systolic heart failure ([place])   4. Biventricular cardiac pacemaker in situ   5. Hyperlipidemia, unspecified hyperlipidemia type   6. OSA (obstructive sleep apnea)   7. Essential hypertension   8. Educated about COVID-19 virus infection    PLAN:    In order of problems listed above:  1. Secondary prevention discussed.  I strongly encouraged aerobic activity to achieve 150 minutes of moderate activity per week. 2. Biventricular pacing has improved LV systolic function 3. No clinical evidence of volume overload.  Despite trace to 1+ lower extremity edema, neck veins are perfectly flat.  No change in therapy needed. 4. Having diaphragm pacing when he squats and bends forward.  Will let Dr. Lovena Le know. 5. Risk factor modification should continue aggressively including statin intensity as needed to keep LDL less than 70. 6. CPAP compliance encouraged 7. Target 130/80 mmHg.  Continue Bystolic. 8. Vaccine has been received.  Social distancing is being practiced.  Overall education and awareness concerning primary/secondary risk prevention was discussed in detail:  LDL less than 70, hemoglobin A1c less than 7, blood pressure target less than 130/80 mmHg, >150 minutes of moderate aerobic activity per week, avoidance of smoking, weight control (via diet and exercise), and continued surveillance/management of/for obstructive sleep apnea.    Medication Adjustments/Labs and Tests Ordered: Current medicines are reviewed at length with the patient today.  Concerns regarding medicines are outlined above.  No orders of the defined types were placed in this encounter.  No orders of the defined types were placed in this encounter.   There are no Patient Instructions on file for this visit.   Signed, Sinclair Grooms, MD  04/24/2020 9:13 AM    [place]

## 2020-04-24 ENCOUNTER — Encounter: Payer: Self-pay | Admitting: Interventional Cardiology

## 2020-04-24 ENCOUNTER — Ambulatory Visit: Payer: Medicare Other | Admitting: Interventional Cardiology

## 2020-04-24 ENCOUNTER — Other Ambulatory Visit: Payer: Self-pay

## 2020-04-24 VITALS — BP 132/70 | HR 75 | Ht 69.5 in | Wt 194.4 lb

## 2020-04-24 DIAGNOSIS — G4733 Obstructive sleep apnea (adult) (pediatric): Secondary | ICD-10-CM

## 2020-04-24 DIAGNOSIS — E785 Hyperlipidemia, unspecified: Secondary | ICD-10-CM

## 2020-04-24 DIAGNOSIS — I447 Left bundle-branch block, unspecified: Secondary | ICD-10-CM | POA: Diagnosis not present

## 2020-04-24 DIAGNOSIS — Z7189 Other specified counseling: Secondary | ICD-10-CM

## 2020-04-24 DIAGNOSIS — Z950 Presence of cardiac pacemaker: Secondary | ICD-10-CM | POA: Diagnosis not present

## 2020-04-24 DIAGNOSIS — I2510 Atherosclerotic heart disease of native coronary artery without angina pectoris: Secondary | ICD-10-CM

## 2020-04-24 DIAGNOSIS — I5022 Chronic systolic (congestive) heart failure: Secondary | ICD-10-CM

## 2020-04-24 DIAGNOSIS — I10 Essential (primary) hypertension: Secondary | ICD-10-CM

## 2020-04-24 NOTE — Patient Instructions (Signed)

## 2020-04-29 ENCOUNTER — Other Ambulatory Visit: Payer: Self-pay | Admitting: Pulmonary Disease

## 2020-04-29 ENCOUNTER — Other Ambulatory Visit: Payer: Self-pay | Admitting: Internal Medicine

## 2020-04-29 DIAGNOSIS — J129 Viral pneumonia, unspecified: Secondary | ICD-10-CM

## 2020-04-29 DIAGNOSIS — J45909 Unspecified asthma, uncomplicated: Secondary | ICD-10-CM

## 2020-04-30 ENCOUNTER — Other Ambulatory Visit: Payer: Self-pay | Admitting: *Deleted

## 2020-04-30 DIAGNOSIS — J129 Viral pneumonia, unspecified: Secondary | ICD-10-CM

## 2020-04-30 DIAGNOSIS — J45909 Unspecified asthma, uncomplicated: Secondary | ICD-10-CM

## 2020-04-30 MED ORDER — LEVALBUTEROL HCL 1.25 MG/3ML IN NEBU: 1.2500 mg | INHALATION_SOLUTION | Freq: Four times a day (QID) | RESPIRATORY_TRACT | 3 refills | Status: DC

## 2020-04-30 MED ORDER — IPRATROPIUM BROMIDE 0.02 % IN SOLN: 0.5000 mg | Freq: Four times a day (QID) | RESPIRATORY_TRACT | 3 refills | Status: DC

## 2020-05-02 ENCOUNTER — Ambulatory Visit (INDEPENDENT_AMBULATORY_CARE_PROVIDER_SITE_OTHER): Payer: Medicare Other | Admitting: *Deleted

## 2020-05-02 DIAGNOSIS — I428 Other cardiomyopathies: Secondary | ICD-10-CM | POA: Diagnosis not present

## 2020-05-02 LAB — CUP PACEART REMOTE DEVICE CHECK
Battery Remaining Longevity: 92 mo
Battery Remaining Percentage: 95.5 %
Battery Voltage: 2.99 V
Brady Statistic AP VP Percent: 23 %
Brady Statistic AP VS Percent: 1 %
Brady Statistic AS VP Percent: 76 %
Brady Statistic AS VS Percent: 1 %
Brady Statistic RA Percent Paced: 23 %
Date Time Interrogation Session: 20210521035505
Implantable Lead Implant Date: 20190813
Implantable Lead Implant Date: 20190813
Implantable Lead Implant Date: 20190813
Implantable Lead Location: 753858
Implantable Lead Location: 753859
Implantable Lead Location: 753860
Implantable Pulse Generator Implant Date: 20190813
Lead Channel Impedance Value: 400 Ohm
Lead Channel Impedance Value: 460 Ohm
Lead Channel Impedance Value: 780 Ohm
Lead Channel Pacing Threshold Amplitude: 0.5 V
Lead Channel Pacing Threshold Amplitude: 0.625 V
Lead Channel Pacing Threshold Amplitude: 1.625 V
Lead Channel Pacing Threshold Pulse Width: 0.5 ms
Lead Channel Pacing Threshold Pulse Width: 0.5 ms
Lead Channel Pacing Threshold Pulse Width: 0.5 ms
Lead Channel Sensing Intrinsic Amplitude: 2.2 mV
Lead Channel Sensing Intrinsic Amplitude: 8.1 mV
Lead Channel Setting Pacing Amplitude: 1.625
Lead Channel Setting Pacing Amplitude: 2.5 V
Lead Channel Setting Pacing Amplitude: 2.625
Lead Channel Setting Pacing Pulse Width: 0.5 ms
Lead Channel Setting Pacing Pulse Width: 0.5 ms
Lead Channel Setting Sensing Sensitivity: 2 mV
Pulse Gen Model: 3562
Pulse Gen Serial Number: 9043864

## 2020-05-05 NOTE — Progress Notes (Signed)
Remote pacemaker transmission.   

## 2020-05-09 ENCOUNTER — Other Ambulatory Visit: Payer: Self-pay | Admitting: Internal Medicine

## 2020-05-11 MED ORDER — HYDROCODONE-ACETAMINOPHEN 7.5-325 MG PO TABS: 1.0000 | ORAL_TABLET | Freq: Four times a day (QID) | ORAL | 0 refills | Status: DC | PRN

## 2020-05-14 ENCOUNTER — Ambulatory Visit: Payer: Medicare Other | Admitting: Pulmonary Disease

## 2020-06-04 ENCOUNTER — Other Ambulatory Visit: Payer: Self-pay | Admitting: Interventional Cardiology

## 2020-06-12 DIAGNOSIS — L819 Disorder of pigmentation, unspecified: Secondary | ICD-10-CM | POA: Diagnosis not present

## 2020-06-12 DIAGNOSIS — D485 Neoplasm of uncertain behavior of skin: Secondary | ICD-10-CM | POA: Diagnosis not present

## 2020-06-12 DIAGNOSIS — L821 Other seborrheic keratosis: Secondary | ICD-10-CM | POA: Diagnosis not present

## 2020-06-15 ENCOUNTER — Other Ambulatory Visit: Payer: Self-pay | Admitting: Internal Medicine

## 2020-06-16 ENCOUNTER — Other Ambulatory Visit: Payer: Self-pay | Admitting: Internal Medicine

## 2020-06-25 ENCOUNTER — Other Ambulatory Visit: Payer: Self-pay | Admitting: Internal Medicine

## 2020-06-27 MED ORDER — HYDROCODONE-ACETAMINOPHEN 7.5-325 MG PO TABS: 1.0000 | ORAL_TABLET | Freq: Four times a day (QID) | ORAL | 0 refills | Status: DC | PRN

## 2020-07-14 ENCOUNTER — Encounter: Payer: Self-pay | Admitting: Internal Medicine

## 2020-07-21 ENCOUNTER — Telehealth: Payer: Self-pay

## 2020-07-21 NOTE — Telephone Encounter (Signed)
Last remote transmission 05/02/20.  Attempted to call pt to have him send manual transmission, no answer.  Sent Mychart message with instructions.

## 2020-07-21 NOTE — Telephone Encounter (Signed)
Spoke with pt in depth regarding his recent report of episode while mowing the lawn.  Pt sent manual transmission.  The episode mowing the lawn occurred 8/8, there were no arrythmias documented during that time.  Pt did however have Noise reversions mostly on RA lead, but at time also occurring on RV lead.  Reviewed times/ dates these occurred with pt.  He cannot recall what he was doing.  It seems he does have a head/ neck massager that he uses at times. Scheduled pt for next avail in clinic appt with Renee U. PA for in person testing.

## 2020-07-21 NOTE — Telephone Encounter (Signed)
I tried to call the patient to discuss/ determine exactly what type of equipment he was using there was no answer.  It appears there is noise on his leads, sometimes it is only on the Atrial lead though.  Typically Lawnmowers do not cause that type of noise either.

## 2020-07-24 ENCOUNTER — Other Ambulatory Visit: Payer: Self-pay

## 2020-07-24 ENCOUNTER — Ambulatory Visit: Payer: Medicare Other | Admitting: Physician Assistant

## 2020-07-24 VITALS — BP 116/74 | HR 63 | Ht 69.5 in | Wt 192.0 lb

## 2020-07-24 DIAGNOSIS — I2510 Atherosclerotic heart disease of native coronary artery without angina pectoris: Secondary | ICD-10-CM

## 2020-07-24 DIAGNOSIS — Z950 Presence of cardiac pacemaker: Secondary | ICD-10-CM

## 2020-07-24 DIAGNOSIS — I428 Other cardiomyopathies: Secondary | ICD-10-CM

## 2020-07-24 DIAGNOSIS — I5022 Chronic systolic (congestive) heart failure: Secondary | ICD-10-CM

## 2020-07-24 NOTE — Patient Instructions (Signed)
Medication Instructions:   Your physician recommends that you continue on your current medications as directed. Please refer to the Current Medication list given to you today.  *If you need a refill on your cardiac medications before your next appointment, please call your pharmacy*   Lab Work: [place]   If you have labs (blood work) drawn today and your tests are completely normal, you will receive your results only by: [name] MyChart Message (if you have MyChart) OR . A paper copy in the mail If you have any lab test that is abnormal or we need to change your treatment, we will call you to review the results.   Testing/Procedures: NONE ORDERED  TODAY   Follow-Up: At [facility], you and your health needs are our priority.  As part of our continuing mission to provide you with exceptional heart care, we have created designated Provider Care Teams.  These Care Teams include your primary Cardiologist (physician) and Advanced Practice Providers (APPs -  Physician Assistants and Nurse Practitioners) who all work together to provide you with the care you need, when you need it.  We recommend signing up for the patient portal called "MyChart".  Sign up information is provided on this After Visit Summary.  MyChart is used to connect with patients for Virtual Visits (Telemedicine).  Patients are able to view lab/test results, encounter notes, upcoming appointments, etc.  Non-urgent messages can be sent to your provider as well.   To learn more about what you can do with MyChart, go to [email or web address].    Your next appointment:   1 month(s)  The format for your next appointment:   In Person  Provider:    You may see Dr. Lovena Le     Other Instructions

## 2020-07-24 NOTE — Progress Notes (Signed)
Cardiology Office Note Date:  07/24/2020  Patient ID:  Jeffery [name], DOB 14-Jun-1941, MRN [id number] PCP:  [name], MD  Cardiologist:  Dr. Tamala Julian EP: Dr. Lovena Le    Chief Complaint:  weak episode  History of Present Illness: Jeffery [name] is a 79 y.o. 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  He comes in today to be seen for Dr. Lovena Le, last seen by him Aug 2020.  t that time doing well, class I symptoms, device function reported as functioning normally, no changes were made.  Most recently he saw Dr. Tamala Julian May 2021, he was feeling well, mentioned diaphragmatic stim when squatting and bending forward.  The patient called the office noting that when doing some activities in the yard (mowing grass etc) using gas powered machines he had been feeling unusually labored, easily winded and had noted that his HR was dropping to high 40's- low 50's.  His normal noted exercise HR typically 100-110.  When he started to use the lawnmower again, he again felt the same. device transmission noted  A lead noise, RN also mentioned some intermittent V noise. Timing could not be clearly verified with lawn mower , there was some mention of a home massager use.  TODAY He discusses as noted above.  This is the only time he has noted this.  He had no CP, did not feel dizzy or lightheaded.  Was push mowing and is up/down a hill and labor intensive.  He has not had difficulty or similar symptoms before with this activity.  He has not mowed the lawn yet again. He denies any symptoms when in the pool or exerting himself otherwise, feels like he has very good exertional capacity He thinks he may have been having ectopy, PVCs  He denies any dizzy spells, no near syncope or syncope  Device information [employer] CRT-P  implanted 07/25/2018   Past Medical History:  Diagnosis Date  . Arthritis    "thumbs" (07/25/2018)  . Asthma   . CHF (congestive heart failure) ([place])   . Chronic bronchitis ([place])   . Environmental allergies   . GERD (gastroesophageal reflux disease)   . Headache    "seasonal; w/environmental allergies" (07/25/2018)  . History of blood transfusion    "when I had laminectomy" (07/25/2018)  . HTN (hypertension)   . Hyperlipidemia   . LBBB (left bundle branch block) 1999  . Metatarsal bone fracture 03/07/2018  . Myocardial infarction [facility])    "was told I've had an old MI; probably in the 18s" (07/25/2018)  . Pneumonia    "as a child, age 41; viral pneumonia 3 times in the last 10 years" (07/25/2018)  . Presence of permanent cardiac pacemaker 07/25/2018  . Seasonal allergies   . Sleep apnea    "wife says I do" (07/25/2018)  . Type II diabetes mellitus ([place])     Past Surgical History:  Procedure Laterality Date  . BACK SURGERY    . BIV PACEMAKER INSERTION CRT-P  07/25/2018  . BIV PACEMAKER INSERTION CRT-P N/A 07/25/2018   Procedure: BIV PACEMAKER INSERTION CRT-P;  Surgeon: Evans Lance, MD;  Location: [facility] [place] [facility];  Service: Cardiovascular;  Laterality: N/A;  . CARDIAC CATHETERIZATION  2003   Dr Pernell Dupre; 85 % R circumflex obstruction  . CARPAL TUNNEL RELEASE Left 10/11/2014  Procedure: LEFT CARPAL TUNNEL RELEASE;  Surgeon: Roseanne Kaufman, MD;  Location: [place];  Service: Orthopedics;  Laterality: Left;  . COLONOSCOPY W/ BIOPSIES AND POLYPECTOMY  2018  . INGUINAL HERNIA REPAIR Right 1948  . INGUINAL HERNIA REPAIR Left 1988  . LUMBAR LAMINECTOMY  1984  . MINOR CARPAL TUNNEL Right 11/22/2014   Procedure: RIGHT LIMITED OPEN CARPAL TUNNEL RELEASE;  Surgeon: Roseanne Kaufman, MD;  Location: [place];  Service: Orthopedics;  Laterality: Right;  . TONSILLECTOMY  1958    Current Outpatient Medications  Medication Sig Dispense Refill  .  ACCU-CHEK AVIVA PLUS test strip USE AS INSTRUCTED TO TEST BLOOD SUGAR 6 TIMES DAILY. DX CODE: E11.59 500 strip 4  . ACCU-CHEK SOFTCLIX LANCETS lancets Use to test blood sugar daily as directed. 100 each 3  . albuterol (PROAIR HFA) 108 (90 BASE) MCG/ACT inhaler Inhale 2 puffs into the lungs every 6 (six) hours as needed for wheezing or shortness of breath.     [name] albuterol (PROVENTIL) (2.5 MG/3ML) 0.083% nebulizer solution TAKE 3 MLS BY NEBULIZATION EVERY 6 (SIX) HOURS AS NEEDED FOR WHEEZING OR SHORTNESS OF BREATH. 150 mL 5  . aspirin 81 MG tablet Take 81 mg by mouth at bedtime.      [name] BYSTOLIC 5 MG tablet TAKE 1 TABLET BY MOUTH  DAILY 90 tablet 3  . Continuous Blood Gluc Receiver (FREESTYLE LIBRE 14 DAY READER) DEVI 1 Device by Does not apply route every 14 (fourteen) days. 1 Device 0  . Continuous Blood Gluc Sensor (FREESTYLE LIBRE 14 DAY SENSOR) MISC 1 EACH BY DOES NOT APPLY ROUTE EVERY 14 (FOURTEEN) DAYS. 2 each 2  . famotidine (PEPCID) 20 MG tablet Take 1 tablet (20 mg total) by mouth daily. 30 tablet 0  . fexofenadine (ALLEGRA) 180 MG tablet Take 180 mg by mouth daily as needed for allergies.     [name] HYDROcodone-acetaminophen (NORCO) 7.5-325 MG tablet Take 1 tablet by mouth every 6 (six) hours as needed (for chronic joint pain). 60 tablet 0  . ibuprofen (ADVIL,MOTRIN) 800 MG tablet Take 800 mg by mouth every 8 (eight) hours as needed for headache or moderate pain.     [name] ipratropium (ATROVENT) 0.02 % nebulizer solution Take 2.5 mLs (0.5 mg total) by nebulization every 6 (six) hours. 75 mL 3  . levalbuterol (XOPENEX) 1.25 MG/3ML nebulizer solution Take 1.25 mg by nebulization every 6 (six) hours. 120 mL 3  . LORazepam (ATIVAN) 1 MG tablet TAKE 1/2 TO 1 TABLET BY MOUTH DAILY AS NEEDED. 30 tablet 1  . metFORMIN (GLUCOPHAGE) 500 MG tablet TAKE 1 TABLET BY MOUTH  DAILY WITH BREAKFAST 90 tablet 1  . montelukast (SINGULAIR) 10 MG tablet TAKE 1 TABLET BY MOUTH AT  BEDTIME 90 tablet 3  . Multiple Vitamin  (MULTIVITAMIN WITH MINERALS) TABS tablet Take 1 tablet by mouth daily.    . nitroGLYCERIN (NITROSTAT) 0.4 MG SL tablet PLACE 1 TABLET (0.4 MG TOTAL) UNDER THE TONGUE EVERY 5 (FIVE) MINUTES X 3 DOSES AS NEEDED FOR CHEST PAIN. 25 tablet 3  . Respiratory Therapy Supplies (FLUTTER) DEVI Twice a day and prn as needed, may increase if feeling worse 1 each 0  . rosuvastatin (CRESTOR) 10 MG tablet TAKE ONE TABLET BY MOUTH DAILY ON MONDAY, WEDNESDAY AND FRIDAY 45 tablet 2  . SYMBICORT 160-4.5 MCG/ACT inhaler USE 1 TO 2 INHALATIONS BY  MOUTH EVERY 12 HOURS AS  NEEDED 30.6 g 3  . vardenafil (LEVITRA) 20 MG tablet TAKE  1 TABLET BY MOUTH AS DIRECTED. CAN NOT BE TAKEN WITH NITROGLYCERIN. NOT COVERED BY INSURANCE 6 tablet 2  . zinc gluconate 50 MG tablet Take 50 mg by mouth daily.     No current facility-administered medications for this visit.    Allergies:   Contrast media [iodinated diagnostic agents]   Social History:  The patient  reports that he quit smoking about 56 years ago. His smoking use included cigarettes. He has a 4.00 pack-year smoking history. He has never used smokeless tobacco. He reports previous alcohol use of about 2.0 standard drinks of alcohol per week. He reports that he does not use drugs.   Family History:  The patient's family history includes Colon polyps in his father; Diabetes in his maternal aunt; Heart attack in his father; Heart failure in his mother; Hypertension in his mother; Subarachnoid hemorrhage (age of onset: 38) in his mother; Subarachnoid hemorrhage (age of onset: 67) in his paternal grandfather.  ROS:  Please see the history of present illness.  All other systems are reviewed and otherwise negative.   PHYSICAL EXAM:  VS:  There were no vitals taken for this visit. BMI: There is no height or weight on file to calculate BMI. Well nourished, well developed, in no acute distress  HEENT: normocephalic, atraumatic  Neck: no JVD, carotid bruits or masses Cardiac:  RRR;  no significant murmurs, no rubs, or gallops Lungs:  CTA b/l, no wheezing, rhonchi or rales  Abd: soft, nontender MS: no deformity or atrophy Ext: no edema  Skin: warm and dry, no rash Neuro:  No gross deficits appreciated Psych: euthymic mood, full affect  PPM site: no skin changes, tethering, tenderness, or fluctuation   EKG:  Done today and reviewed by myself:  SR/V paced   PPM interrogation done today and reviewed by myself: Battery and lead measurements are good One true AT, 6 seconds All other AMS with avial EGMs are noise He has had A lead noise reversions He has had V lead noise as well though not seen by the device  He is device dependent today He had PMT today with A threshold testing     ECHOCARDIOGRAM 2019: Study Conclusions  - Left ventricle: The cavity size was mildly dilated. Systolic  function was mildly to moderately reduced. The estimated ejection  fraction was in the range of 40% to 45%. Although no diagnostic  regional wall motion abnormality was identified, this possibility  cannot be completely excluded on the basis of this study.  Features are consistent with a pseudonormal left ventricular  filling pattern, with concomitant abnormal relaxation and  increased filling pressure (grade 2 diastolic dysfunction).  - Ventricular septum: Septal motion showed abnormal function and  dyssynergy. These changes are consistent with a left bundle  branch block.  - Aortic valve: Trileaflet; normal thickness leaflets.  Transvalvular velocity was within the normal range. There was no  stenosis. There was no regurgitation.  - Mitral valve: Transvalvular velocity was within the normal range.  There was no evidence for stenosis. There was no regurgitation.  - Right ventricle: The cavity size was moderately dilated. Wall  thickness was normal. Systolic function was mildly reduced.  - Atrial septum: No defect or patent foramen ovale was  identified.  - Tricuspid valve: There was trivial regurgitation.  - Pulmonic valve: There was no significant regurgitation.   Impressions:  - Moderately reduced systolic LV function. Septal motion consistent  with dyssynchrony. No significant change from prior.    07/11/2018: ETT  Blood pressure demonstrated a normal response to exercise.  No T wave inversion was noted during stress.  There was no ST segment deviation noted during stress.   Study suggestive of chronotropic incompetence with poor HR augmentation with exercise. Frequent PVC's noted with underlying LBBB.  Blood pressure demonstrated a normal response to exercise.  No T wave inversion was noted during stress.  There was no ST segment deviation noted during stress.   Submaximal stress test - non-diagnostic. Patient has resting LBBB which makes interpretation of ischemia very challenging and GXT is not typically recommended for these patients. Poor exercise tolerance. Numerous PVC's noted. Recommend lexiscan myoview for ischemia evaluation (CT coronary angiography would be challenging given the degree of ventricular ectopy).    09/14/17: stress myoview  Nuclear stress EF: 41%.  There was no ST segment deviation noted during stress.  The left ventricular ejection fraction is moderately decreased (30-44%).  Findings consistent with prior myocardial infarction.  This is an intermediate risk study.   1. EF 41%, inferior and septal hypokinesis.  2. Primarily fixed, large, severe basal inferolateral/inferior/inferoseptal, mid inferior/inferoseptal and apical inferior/septal perfusion defect.  Possible prior MI with no significant ischemia (cannot fully rule out artifact from LBBB).   Intermediate risk study.    01/27/15: TTE Study Conclusions - Left ventricle: The cavity size was normal. Wall thickness was normal. Systolic function was mildly to moderately reduced. The estimated ejection fraction was in the  range of 40% to 45%. Although no diagnostic regional wall motion abnormality was identified, this possibility cannot be completely excluded on the basis of this study. Features are consistent with a pseudonormal left ventricular filling pattern, with concomitant abnormal relaxation and increased filling pressure (grade 2 diastolic dysfunction). - Ventricular septum: Septal motion showed abnormal function, dyssynergy, and paradox. These changes are consistent with a left bundle branch block. - Left atrium: The atrium was mildly dilated.   07/08/2008: c/MRI Final impression  1. Mild to Moderate LV cavity enlargement. Quantitative ejection  fraction 45%.  2. No evidence of scar tissue. Findings most consistent with  nonischemic cardiomyopathy.  3. Mild left atrial enlargement.  4.. No pericardial effusion.     2002 cath noted  LM short and normal LAD no significant obstruction in LAD or it's branches (20% ostial lesion in RAO view) Cx mod sized gives off 2 marginals 1st OM 50-70% stenosis just distal to bifurcation 2nd OM eccentric 30% just beyond bifurcation from the cx RCA dominant with luminal irregularities     Recent Labs: 02/15/2020: ALT 23; BUN 18; Creatinine, Ser 1.05; Potassium 4.3; Sodium 140  09/14/2019: Chol/HDL Ratio 3.3; Cholesterol, Total 122; HDL 37; LDL Chol Calc (NIH) 68; Triglycerides 85   CrCl cannot be calculated (Patient's most recent lab result is older than the maximum 21 days allowed.).   Wt Readings from Last 3 Encounters:  04/24/20 194 lb 6.4 oz (88.2 kg)  02/15/20 195 lb (88.5 kg)  08/03/19 183 lb (83 kg)     Other studies reviewed: Additional studies/records reviewed today include: summarized above  ASSESSMENT AND PLAN:  1. CRT-P     Intact function  A lead noise reversions have been seen before He has A and V lead noise Measurements are good Unable to reproduce today Some  Are clearly EMI others are larger brief  intermittent noise and seen on BOTH leads (A lead signal is large though noted on both)  He has not had any V noise reversions Last was noted 8/10, timing correlates with  time he was in the pool  I adjusted RV lead sensitivity to 67mV given device dependent  [employer]) rep came and evaluated EGMs agrees these are external, no evidence of lead malfunction.discussed external source may be his pool and has offered to go to his home to evaluate his device/EGMs when in the pool.  He has a remote scheduled for a couple weeks, he will hold off on home visit for now, have him see Dr. Lovena Le in a month or so  Phrenic/diaphragmatic stim only when in a stooped and reaching type position, no LV lead programming changes were made.  Discussed avoiding this position   2. NICM     [place] looks OK, wobbles right at threshold     No symptoms or exam findings to suggest volume OL     On BB, no ACE/ARB will defer to Dr. Tamala Julian  3. CAD (mod OM branch disease by cath in 2002), known LBBB      On ASA, BB, and statin tx  Uncertain what his symtom while mowing the lawn was He has not had an device noted V noise or V reversions, device is functioning appropriately He does not have recurrent symptoms He wonders about ectopy at the time He V paces 99%, so PVC burden is low overall, no HVR episodes or AF Discussed possible angina, but this is a one time eveny to date He will monitor and reach out to Dr. Tamala Julian if any recurrent symptoms  4. HTN     Looks OK     No changes today         Disposition: as above     Current medicines are reviewed at length with the patient today.  The patient did not have any concerns regarding medicines.  Venetia Night, PA-C 07/24/2020 5:02 AM     [facility] [address] [place] [address] [phone] (office)  [phone] (fax)

## 2020-07-31 ENCOUNTER — Other Ambulatory Visit: Payer: Self-pay | Admitting: Interventional Cardiology

## 2020-08-01 ENCOUNTER — Ambulatory Visit (INDEPENDENT_AMBULATORY_CARE_PROVIDER_SITE_OTHER): Payer: Medicare Other | Admitting: *Deleted

## 2020-08-01 DIAGNOSIS — I441 Atrioventricular block, second degree: Secondary | ICD-10-CM | POA: Diagnosis not present

## 2020-08-01 LAB — CUP PACEART REMOTE DEVICE CHECK
Battery Remaining Longevity: 92 mo
Battery Remaining Percentage: 95.5 %
Battery Voltage: 2.99 V
Brady Statistic AP VP Percent: 30 %
Brady Statistic AP VS Percent: 1 %
Brady Statistic AS VP Percent: 69 %
Brady Statistic AS VS Percent: 1 %
Brady Statistic RA Percent Paced: 30 %
Date Time Interrogation Session: 20210820020011
Implantable Lead Implant Date: 20190813
Implantable Lead Implant Date: 20190813
Implantable Lead Implant Date: 20190813
Implantable Lead Location: 753858
Implantable Lead Location: 753859
Implantable Lead Location: 753860
Implantable Pulse Generator Implant Date: 20190813
Lead Channel Impedance Value: 400 Ohm
Lead Channel Impedance Value: 430 Ohm
Lead Channel Impedance Value: 730 Ohm
Lead Channel Pacing Threshold Amplitude: 0.5 V
Lead Channel Pacing Threshold Amplitude: 0.625 V
Lead Channel Pacing Threshold Amplitude: 1.375 V
Lead Channel Pacing Threshold Pulse Width: 0.5 ms
Lead Channel Pacing Threshold Pulse Width: 0.5 ms
Lead Channel Pacing Threshold Pulse Width: 0.5 ms
Lead Channel Sensing Intrinsic Amplitude: 1.8 mV
Lead Channel Sensing Intrinsic Amplitude: 8.1 mV
Lead Channel Setting Pacing Amplitude: 1.625
Lead Channel Setting Pacing Amplitude: 2.375
Lead Channel Setting Pacing Amplitude: 2.5 V
Lead Channel Setting Pacing Pulse Width: 0.5 ms
Lead Channel Setting Pacing Pulse Width: 0.5 ms
Lead Channel Setting Sensing Sensitivity: 5 mV
Pulse Gen Model: 3562
Pulse Gen Serial Number: 9043864

## 2020-08-03 DIAGNOSIS — Z20822 Contact with and (suspected) exposure to covid-19: Secondary | ICD-10-CM | POA: Diagnosis not present

## 2020-08-04 NOTE — Progress Notes (Signed)
Remote pacemaker transmission.   

## 2020-08-13 ENCOUNTER — Other Ambulatory Visit: Payer: Self-pay | Admitting: Internal Medicine

## 2020-08-13 MED ORDER — HYDROCODONE-ACETAMINOPHEN 7.5-325 MG PO TABS: 1.0000 | ORAL_TABLET | Freq: Four times a day (QID) | ORAL | 0 refills | Status: DC | PRN

## 2020-08-13 NOTE — Telephone Encounter (Signed)
Check [place] registry last filled 06/28/2020.Marland KitchenJohny [name]

## 2020-08-16 ENCOUNTER — Encounter: Payer: Self-pay | Admitting: Internal Medicine

## 2020-08-17 MED ORDER — LORAZEPAM 1 MG PO TABS
ORAL_TABLET | ORAL | 5 refills | Status: DC
Start: 2020-08-17 — End: 2021-02-08

## 2020-08-17 NOTE — Progress Notes (Signed)
Subjective:    Patient ID: Jeffery [name], male    DOB: [birth year], 79 y.o.   MRN: [id number]  HPI The patient is here for follow up of their chronic medical problems, including chronic pain management, DM, htn, hyperlipidemia, sleep difficulties, asthma  He is taking all of his medications as prescribed.    His sugars are well controlled at home.   He is exercising regularly.   He gets 7500-10000 steps at day.  He feels all of his medications are working well.  Medications and allergies reviewed with patient and updated if appropriate.  Patient Active Problem List   Diagnosis Date Noted   Pain medication agreement signed, 02/15/2020 02/15/2020   Peroneal mononeuropathy, left 12/25/2018   Diabetic polyneuropathy associated with type 2 diabetes mellitus ([place]) 12/25/2018   Cylindrical bronchiectasis ([place]) 12/20/2018   Viral pneumonia 12/12/2018   Community acquired pneumonia 70/12/7492   Folliculitis 49/67/5916   Chronic systolic heart failure ([place]) 07/25/2018   Encounter for chronic pain management 04/13/2017   Osteoarthritis 04/12/2017   Chronic combined systolic and diastolic CHF (congestive heart failure) ([place]) 01/24/2015   Left bundle branch block 11/27/2013   Essential hypertension 11/27/2013   GERD (gastroesophageal reflux disease) 02/27/2013   Type 2 diabetes mellitus with vascular disease ([place]) 04/09/2009   HLD (hyperlipidemia) 09/06/2008   Coronary atherosclerosis 09/06/2008   Asthma 09/06/2008    Current Outpatient Medications on File Prior to Visit  Medication Sig Dispense Refill   ACCU-CHEK AVIVA PLUS test strip USE AS INSTRUCTED TO TEST BLOOD SUGAR 6 TIMES DAILY. DX CODE: E11.59 500 strip 4   ACCU-CHEK SOFTCLIX LANCETS lancets Use to test blood sugar daily as directed. 100 each 3   albuterol (PROAIR HFA) 108 (90 BASE) MCG/ACT inhaler Inhale 2 puffs into the lungs every 6 (six) hours as needed for wheezing or shortness of breath.       albuterol (PROVENTIL) (2.5 MG/3ML) 0.083% nebulizer solution TAKE 3 MLS BY NEBULIZATION EVERY 6 (SIX) HOURS AS NEEDED FOR WHEEZING OR SHORTNESS OF BREATH. 150 mL 5   aspirin 81 MG tablet Take 81 mg by mouth at bedtime.       BYSTOLIC 5 MG tablet TAKE 1 TABLET BY MOUTH  DAILY 90 tablet 2   Continuous Blood Gluc Receiver (FREESTYLE LIBRE 14 DAY READER) DEVI 1 Device by Does not apply route every 14 (fourteen) days. 1 Device 0   Continuous Blood Gluc Sensor (FREESTYLE LIBRE 14 DAY SENSOR) MISC 1 EACH BY DOES NOT APPLY ROUTE EVERY 14 (FOURTEEN) DAYS. 2 each 2   fexofenadine (ALLEGRA) 180 MG tablet Take 180 mg by mouth daily as needed for allergies.      HYDROcodone-acetaminophen (NORCO) 7.5-325 MG tablet Take 1 tablet by mouth every 6 (six) hours as needed (for chronic joint pain). 60 tablet 0   ipratropium (ATROVENT) 0.02 % nebulizer solution Take 2.5 mLs (0.5 mg total) by nebulization every 6 (six) hours. 75 mL 3   levalbuterol (XOPENEX) 1.25 MG/3ML nebulizer solution Take 1.25 mg by nebulization every 6 (six) hours. 120 mL 3   LORazepam (ATIVAN) 1 MG tablet TAKE 1/2 TO 1 TABLET BY MOUTH DAILY AS NEEDED. 30 tablet 5   metFORMIN (GLUCOPHAGE) 500 MG tablet TAKE 1 TABLET BY MOUTH  DAILY WITH BREAKFAST 90 tablet 1   montelukast (SINGULAIR) 10 MG tablet TAKE 1 TABLET BY MOUTH AT  BEDTIME 90 tablet 3   Multiple Vitamin (MULTIVITAMIN WITH MINERALS) TABS tablet Take 1 tablet by mouth daily.  nitroGLYCERIN (NITROSTAT) 0.4 MG SL tablet PLACE 1 TABLET (0.4 MG TOTAL) UNDER THE TONGUE EVERY 5 (FIVE) MINUTES X 3 DOSES AS NEEDED FOR CHEST PAIN. 25 tablet 3   Respiratory Therapy Supplies (FLUTTER) DEVI Twice a day and prn as needed, may increase if feeling worse 1 each 0   rosuvastatin (CRESTOR) 10 MG tablet TAKE ONE TABLET BY MOUTH DAILY ON MONDAY, WEDNESDAY AND FRIDAY 45 tablet 2   SYMBICORT 160-4.5 MCG/ACT inhaler USE 1 TO 2 INHALATIONS BY  MOUTH EVERY 12 HOURS AS  NEEDED 30.6 g 3   vardenafil  (LEVITRA) 20 MG tablet TAKE 1 TABLET BY MOUTH AS DIRECTED. CAN NOT BE TAKEN WITH NITROGLYCERIN. NOT COVERED BY INSURANCE 6 tablet 2   zinc gluconate 50 MG tablet Take 50 mg by mouth daily.     famotidine (PEPCID) 20 MG tablet Take 1 tablet (20 mg total) by mouth daily. 30 tablet 0   No current facility-administered medications on file prior to visit.    Past Medical History:  Diagnosis Date   Arthritis    "thumbs" (07/25/2018)   Asthma    CHF (congestive heart failure) (HCC)    Chronic bronchitis (HCC)    Environmental allergies    GERD (gastroesophageal reflux disease)    Headache    "seasonal; w/environmental allergies" (07/25/2018)   History of blood transfusion    "when I had laminectomy" (07/25/2018)   HTN (hypertension)    Hyperlipidemia    LBBB (left bundle branch block) 1999   Metatarsal bone fracture 03/07/2018   Myocardial infarction [facility])    "was told I've had an old MI; probably in the 58s" (07/25/2018)   Pneumonia    "as a child, age 43; viral pneumonia 3 times in the last 10 years" (07/25/2018)   Presence of permanent cardiac pacemaker 07/25/2018   Seasonal allergies    Sleep apnea    "wife says I do" (07/25/2018)   Type II diabetes mellitus ([place])     Past Surgical History:  Procedure Laterality Date   BACK SURGERY     BIV PACEMAKER INSERTION CRT-P  07/25/2018   BIV PACEMAKER INSERTION CRT-P N/A 07/25/2018   Procedure: BIV PACEMAKER INSERTION CRT-P;  Surgeon: Evans Lance, MD;  Location: [place] [facility];  Service: Cardiovascular;  Laterality: N/A;   CARDIAC CATHETERIZATION  2003   Dr Pernell Dupre; 85 % R circumflex obstruction   CARPAL TUNNEL RELEASE Left 10/11/2014   Procedure: LEFT CARPAL TUNNEL RELEASE;  Surgeon: Roseanne Kaufman, MD;  Location: [place];  Service: Orthopedics;  Laterality: Left;   COLONOSCOPY W/ BIOPSIES AND POLYPECTOMY  2018   INGUINAL HERNIA REPAIR Right 1948   INGUINAL HERNIA REPAIR Left 1988     LUMBAR LAMINECTOMY  1984   MINOR CARPAL TUNNEL Right 11/22/2014   Procedure: RIGHT LIMITED OPEN CARPAL TUNNEL RELEASE;  Surgeon: Roseanne Kaufman, MD;  Location: [facility];  Service: Orthopedics;  Laterality: Right;   TONSILLECTOMY  1958    Social History   Socioeconomic History   Marital status: Married    Spouse name: Not on file   Number of children: 3   Years of education: Not on file   Highest education level: Professional school degree (e.g., MD, DDS, DVM, JD)  Occupational History   Occupation: retired Pension scheme manager: [employer]  Tobacco Use   Smoking status: Former Smoker    Packs/day: 2.00    Years: 2.00    Pack years: 4.00  Types: Cigarettes    Quit date: 12/14/1963    Years since quitting: 56.7   Smokeless tobacco: Never Used  Vaping Use   Vaping Use: Never used  Substance and Sexual Activity   Alcohol use: Not Currently    Alcohol/week: 2.0 standard drinks    Types: 2 Glasses of wine per week    Comment: occasional   Drug use: Never   Sexual activity: Yes  Other Topics Concern   Not on file  Social History Narrative   Lives with wife in a 2 story home.  Has 3 daughters.  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     Social Determinants of Health   Financial Resource Strain:    Difficulty of Paying Living Expenses: Not on file  Food Insecurity:    Worried About Charity fundraiser in the Last Year: Not on file   [employer] of Food in the Last Year: Not on file  Transportation Needs:    Lack of Transportation (Medical): Not on file   Lack of Transportation (Non-Medical): Not on file  Physical Activity:    Days of Exercise per Week: Not on file   Minutes of Exercise per Session: Not on file  Stress:    Feeling of Stress : Not on file  Social Connections:    Frequency of Communication with Friends and Family: Not on file   Frequency of Social Gatherings with Friends and Family: Not on file   Attends  Religious Services: Not on file   Active Member of Clubs or Organizations: Not on file   Attends Archivist Meetings: Not on file   Marital Status: Not on file    Family History  Problem Relation Age of Onset   Heart attack Father        23s   Colon polyps Father    Heart failure Mother        90s   Subarachnoid hemorrhage Mother 57   Hypertension Mother    Subarachnoid hemorrhage Paternal Grandfather 62   Diabetes Maternal Aunt    Colon cancer Neg Hx     Review of Systems  Constitutional: Negative for fever.  Respiratory: Negative for cough, shortness of breath and wheezing.   Cardiovascular: Positive for leg swelling (mild). Negative for chest pain and palpitations.  Neurological: Positive for numbness (intermittent in feet and toes). Negative for light-headedness and headaches.       Objective:   Vitals:   08/19/20 1017  BP: 134/78  Pulse: 67  Temp: 98.3 F (36.8 C)  SpO2: 97%   BP Readings from Last 3 Encounters:  08/19/20 134/78  07/24/20 116/74  04/24/20 132/70   Wt Readings from Last 3 Encounters:  08/19/20 190 lb 6.4 oz (86.4 kg)  07/24/20 192 lb (87.1 kg)  04/24/20 194 lb 6.4 oz (88.2 kg)   Body mass index is 27.71 kg/m.   Physical Exam    Constitutional: Appears well-developed and well-nourished. No distress.  HENT:  Head: Normocephalic and atraumatic.  Neck: Neck supple. No tracheal deviation present. No thyromegaly present.  No cervical lymphadenopathy Cardiovascular: Normal rate, regular rhythm and normal heart sounds.   No murmur heard. No carotid bruit .  No edema Pulmonary/Chest: Effort normal and breath sounds normal. No respiratory distress. No has no wheezes. No rales.  Skin: Skin is warm and dry. Not diaphoretic.  Psychiatric: Normal mood and affect. Behavior is normal.      Assessment & Plan:    See Problem List for  Assessment and Plan of chronic medical problems.    This visit occurred during the  SARS-CoV-2 public health emergency.  Safety protocols were in place, including screening questions prior to the visit, additional usage of staff PPE, and extensive cleaning of exam room while observing appropriate contact time as indicated for disinfecting solutions.

## 2020-08-17 NOTE — Patient Instructions (Addendum)
  Blood work was ordered.     Medications reviewed and updated.  Changes include : none    Your prescription(s) have been submitted to your pharmacy. Please take as directed and contact our office if you believe you are having problem(s) with the medication(s).  A referral was ordered for sports medicine - Dr Tamala Julian.      Someone from their office will call you to schedule an appointment.     Please followup in 6 months

## 2020-08-19 ENCOUNTER — Ambulatory Visit (INDEPENDENT_AMBULATORY_CARE_PROVIDER_SITE_OTHER): Payer: Medicare Other | Admitting: Internal Medicine

## 2020-08-19 ENCOUNTER — Other Ambulatory Visit: Payer: Self-pay

## 2020-08-19 ENCOUNTER — Encounter: Payer: Self-pay | Admitting: Internal Medicine

## 2020-08-19 VITALS — BP 134/78 | HR 67 | Temp 98.3°F | Wt 190.4 lb

## 2020-08-19 DIAGNOSIS — E782 Mixed hyperlipidemia: Secondary | ICD-10-CM

## 2020-08-19 DIAGNOSIS — I10 Essential (primary) hypertension: Secondary | ICD-10-CM | POA: Diagnosis not present

## 2020-08-19 DIAGNOSIS — E1159 Type 2 diabetes mellitus with other circulatory complications: Secondary | ICD-10-CM

## 2020-08-19 DIAGNOSIS — E1142 Type 2 diabetes mellitus with diabetic polyneuropathy: Secondary | ICD-10-CM

## 2020-08-19 DIAGNOSIS — M159 Polyosteoarthritis, unspecified: Secondary | ICD-10-CM

## 2020-08-19 DIAGNOSIS — G8929 Other chronic pain: Secondary | ICD-10-CM

## 2020-08-19 DIAGNOSIS — M542 Cervicalgia: Secondary | ICD-10-CM

## 2020-08-19 NOTE — Assessment & Plan Note (Signed)
Chronic Check a1c, urine micro Low sugar / carb diet Stressed regular exercise

## 2020-08-19 NOTE — Assessment & Plan Note (Signed)
Chronic Check lipid panel  Continue daily statin Regular exercise and healthy diet encouraged  

## 2020-08-19 NOTE — Assessment & Plan Note (Addendum)
Chronic pain with osteoarthritis of multiple joints Take norco 7.5 mg daily as needed Medication helpful and taken appropriately [place] controlled substance database checked.  Continue current regimen

## 2020-08-19 NOTE — Assessment & Plan Note (Signed)
Chronic Sugars have been well controlled-he often notices symptoms when his sugars are little high Check A1c Continue current medications and good sugar control

## 2020-08-19 NOTE — Assessment & Plan Note (Signed)
Chronic BP well controlled Current regimen effective and well tolerated Continue current medications at current doses cmp  

## 2020-08-19 NOTE — Assessment & Plan Note (Signed)
Chronic Arthritis multiple joints Takes Norco as needed Tolerates medication well without side effects Appropriate use Will continue

## 2020-08-20 ENCOUNTER — Other Ambulatory Visit: Payer: Medicare Other

## 2020-08-20 DIAGNOSIS — I10 Essential (primary) hypertension: Secondary | ICD-10-CM | POA: Diagnosis not present

## 2020-08-20 DIAGNOSIS — E782 Mixed hyperlipidemia: Secondary | ICD-10-CM | POA: Diagnosis not present

## 2020-08-20 DIAGNOSIS — E1159 Type 2 diabetes mellitus with other circulatory complications: Secondary | ICD-10-CM

## 2020-08-20 NOTE — Addendum Note (Signed)
Addended by: Cresenciano Lick on: 08/20/2020 11:43 AM   Modules accepted: Orders

## 2020-08-21 LAB — CBC WITH DIFFERENTIAL/PLATELET
Absolute Monocytes: 689 cells/uL (ref 200–950)
Basophils Absolute: 50 cells/uL (ref 0–200)
Basophils Relative: 0.6 %
Eosinophils Absolute: 199 cells/uL (ref 15–500)
Eosinophils Relative: 2.4 %
HCT: 46.7 % (ref 38.5–50.0)
Hemoglobin: 15 g/dL (ref 13.2–17.1)
Lymphs Abs: 1121 cells/uL (ref 850–3900)
MCH: 28.8 pg (ref 27.0–33.0)
MCHC: 32.1 g/dL (ref 32.0–36.0)
MCV: 89.6 fL (ref 80.0–100.0)
MPV: 10.7 fL (ref 7.5–12.5)
Monocytes Relative: 8.3 %
Neutro Abs: 6242 cells/uL (ref 1500–7800)
Neutrophils Relative %: 75.2 %
Platelets: 265 10*3/uL (ref 140–400)
RBC: 5.21 10*6/uL (ref 4.20–5.80)
RDW: 13.3 % (ref 11.0–15.0)
Total Lymphocyte: 13.5 %
WBC: 8.3 10*3/uL (ref 3.8–10.8)

## 2020-08-21 LAB — LIPID PANEL
Cholesterol: 124 mg/dL (ref <200)
HDL: 38 mg/dL — ABNORMAL LOW (ref >=40)
LDL Cholesterol (Calc): 62 mg/dL (calc)
Non-HDL Cholesterol (Calc): 86 mg/dL (calc) (ref <130)
Total CHOL/HDL Ratio: 3.3 (calc) (ref <5.0)
Triglycerides: 166 mg/dL — ABNORMAL HIGH (ref <150)

## 2020-08-21 LAB — COMPLETE METABOLIC PANEL WITH GFR
AG Ratio: 1.7 (calc) (ref 1.0–2.5)
ALT: 23 U/L (ref 9–46)
AST: 26 U/L (ref 10–35)
Albumin: 4.1 g/dL (ref 3.6–5.1)
Alkaline phosphatase (APISO): 81 U/L (ref 35–144)
BUN: 22 mg/dL (ref 7–25)
CO2: 30 mmol/L (ref 20–32)
Calcium: 9.6 mg/dL (ref 8.6–10.3)
Chloride: 103 mmol/L (ref 98–110)
Creat: 1.13 mg/dL (ref 0.70–1.18)
GFR, Est African American: 72 mL/min/{1.73_m2} (ref >=60)
GFR, Est Non African American: 62 mL/min/{1.73_m2} (ref >=60)
Globulin: 2.4 g/dL (calc) (ref 1.9–3.7)
Glucose, Bld: 137 mg/dL — ABNORMAL HIGH (ref 65–99)
Potassium: 5.5 mmol/L — ABNORMAL HIGH (ref 3.5–5.3)
Sodium: 140 mmol/L (ref 135–146)
Total Bilirubin: 0.4 mg/dL (ref 0.2–1.2)
Total Protein: 6.5 g/dL (ref 6.1–8.1)

## 2020-08-21 LAB — MICROALBUMIN / CREATININE URINE RATIO
Creatinine, Urine: 86 mg/dL (ref 20–320)
Microalb Creat Ratio: 17 mcg/mg creat (ref <30)
Microalb, Ur: 1.5 mg/dL

## 2020-08-21 LAB — HEMOGLOBIN A1C
Hgb A1c MFr Bld: 6.5 % of total Hgb — ABNORMAL HIGH (ref <5.7)
Mean Plasma Glucose: 140 (calc)
eAG (mmol/L): 7.7 (calc)

## 2020-08-25 ENCOUNTER — Other Ambulatory Visit: Payer: Self-pay | Admitting: Internal Medicine

## 2020-08-27 ENCOUNTER — Encounter: Payer: Medicare Other | Admitting: Internal Medicine

## 2020-08-29 ENCOUNTER — Other Ambulatory Visit: Payer: Self-pay | Admitting: Interventional Cardiology

## 2020-09-13 ENCOUNTER — Other Ambulatory Visit: Payer: Self-pay | Admitting: Interventional Cardiology

## 2020-09-19 ENCOUNTER — Ambulatory Visit: Payer: Medicare Other | Admitting: Family Medicine

## 2020-09-19 NOTE — Progress Notes (Deleted)
[place] Phone: [phone] Subjective:    I'm seeing this patient by the request  of:  [name], MD  CC:   [id number]  MAICO MULVEHILL is a 79 y.o. male coming in with complaint of neck pain. Last seen in 2019 for 5th metatarsal fracture. Patient states   Onset-  Location Duration-  Character- Aggravating factors- Reliving factors-  Therapies tried-  Severity-     Past Medical History:  Diagnosis Date   Arthritis    "thumbs" (07/25/2018)   Asthma    CHF (congestive heart failure) (HCC)    Chronic bronchitis (HCC)    Environmental allergies    GERD (gastroesophageal reflux disease)    Headache    "seasonal; w/environmental allergies" (07/25/2018)   History of blood transfusion    "when I had laminectomy" (07/25/2018)   HTN (hypertension)    Hyperlipidemia    LBBB (left bundle branch block) 1999   Metatarsal bone fracture 03/07/2018   Myocardial infarction [facility])    "was told I've had an old MI; probably in the 16s" (07/25/2018)   Pneumonia    "as a child, age 64; viral pneumonia 3 times in the last 10 years" (07/25/2018)   Presence of permanent cardiac pacemaker 07/25/2018   Seasonal allergies    Sleep apnea    "wife says I do" (07/25/2018)   Type II diabetes mellitus ([place])    Past Surgical History:  Procedure Laterality Date   BACK SURGERY     BIV PACEMAKER INSERTION CRT-P  07/25/2018   BIV PACEMAKER INSERTION CRT-P N/A 07/25/2018   Procedure: BIV PACEMAKER INSERTION CRT-P;  Surgeon: Evans Lance, MD;  Location: [place] [facility];  Service: Cardiovascular;  Laterality: N/A;   CARDIAC CATHETERIZATION  2003   Dr Pernell Dupre; 85 % R circumflex obstruction   CARPAL TUNNEL RELEASE Left 10/11/2014   Procedure: LEFT CARPAL TUNNEL RELEASE;  Surgeon: Roseanne Kaufman, MD;  Location: [place];  Service: Orthopedics;  Laterality: Left;    COLONOSCOPY W/ BIOPSIES AND POLYPECTOMY  2018   INGUINAL HERNIA REPAIR Right 1948   INGUINAL HERNIA REPAIR Left 1988   LUMBAR LAMINECTOMY  1984   MINOR CARPAL TUNNEL Right 11/22/2014   Procedure: RIGHT LIMITED OPEN CARPAL TUNNEL RELEASE;  Surgeon: Roseanne Kaufman, MD;  Location: [place];  Service: Orthopedics;  Laterality: Right;   TONSILLECTOMY  1958   Social History   Socioeconomic History   Marital status: Married    Spouse name: Not on file   Number of children: 3   Years of education: Not on file   Highest education level: Professional school degree (e.g., MD, DDS, DVM, JD)  Occupational History   Occupation: retired Pension scheme manager: [employer]  Tobacco Use   Smoking status: Former Smoker    Packs/day: 2.00    Years: 2.00    Pack years: 4.00    Types: Cigarettes    Quit date: 12/14/1963    Years since quitting: 56.8   Smokeless tobacco: Never Used  Vaping Use   Vaping Use: Never used  Substance and Sexual Activity   Alcohol use: Not Currently    Alcohol/week: 2.0 standard drinks    Types: 2 Glasses of wine per week    Comment: occasional   Drug use: Never   Sexual activity: Yes  Other Topics Concern   Not on file  Social History Narrative  Lives with wife in a 2 story home.  Has 3 daughters.  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     Social Determinants of Health   Financial Resource Strain:    Difficulty of Paying Living Expenses: Not on file  Food Insecurity:    Worried About Charity fundraiser in the Last Year: Not on file   [employer] of Food in the Last Year: Not on file  Transportation Needs:    Lack of Transportation (Medical): Not on file   Lack of Transportation (Non-Medical): Not on file  Physical Activity:    Days of Exercise per Week: Not on file   Minutes of Exercise per Session: Not on file  Stress:    Feeling of Stress : Not on file  Social Connections:    Frequency of Communication  with Friends and Family: Not on file   Frequency of Social Gatherings with Friends and Family: Not on file   Attends Religious Services: Not on file   Active Member of Clubs or Organizations: Not on file   Attends Archivist Meetings: Not on file   Marital Status: Not on file   Allergies  Allergen Reactions   Contrast Media [Iodinated Diagnostic Agents] Other (See Comments)    Redness and warm sensation, this reaction was noted on 02/27/13 during a Cardiac MRI per pt.  Pt sts he had erythema on his head, chest, and shoulders.  Pt had a pacemaker placed August 2019 and was pre-medicated for the dye and had no allergic reaction at that time. -Carissa Mozingo B.S. RT(R)(CT)   Family History  Problem Relation Age of Onset   Heart attack Father        23s   Colon polyps Father    Heart failure Mother        90s   Subarachnoid hemorrhage Mother 53   Hypertension Mother    Subarachnoid hemorrhage Paternal Grandfather 35   Diabetes Maternal Aunt    Colon cancer Neg Hx     Current Outpatient Medications (Endocrine & Metabolic):    metFORMIN (GLUCOPHAGE) 500 MG tablet, TAKE 1 TABLET BY MOUTH  DAILY WITH BREAKFAST  Current Outpatient Medications (Cardiovascular):    BYSTOLIC 5 MG tablet, TAKE 1 TABLET BY MOUTH  DAILY   nitroGLYCERIN (NITROSTAT) 0.4 MG SL tablet, PLACE 1 TABLET (0.4 MG TOTAL) UNDER THE TONGUE EVERY 5 (FIVE) MINUTES X 3 DOSES AS NEEDED FOR CHEST PAIN.   rosuvastatin (CRESTOR) 10 MG tablet, TAKE ONE TABLET BY MOUTH DAILY ON MONDAY, WEDNESDAY AND FRIDAY   vardenafil (LEVITRA) 20 MG tablet, TAKE 1 TABLET BY MOUTH AS DIRECTED. CAN NOT BE TAKEN WITH NITROGLYCERIN. NOT COVERED BY INSURANCE  Current Outpatient Medications (Respiratory):    albuterol (PROAIR HFA) 108 (90 BASE) MCG/ACT inhaler, Inhale 2 puffs into the lungs every 6 (six) hours as needed for wheezing or shortness of breath.    albuterol (PROVENTIL) (2.5 MG/3ML) 0.083% nebulizer solution,  TAKE 3 MLS BY NEBULIZATION EVERY 6 (SIX) HOURS AS NEEDED FOR WHEEZING OR SHORTNESS OF BREATH.   fexofenadine (ALLEGRA) 180 MG tablet, Take 180 mg by mouth daily as needed for allergies.    ipratropium (ATROVENT) 0.02 % nebulizer solution, Take 2.5 mLs (0.5 mg total) by nebulization every 6 (six) hours.   levalbuterol (XOPENEX) 1.25 MG/3ML nebulizer solution, Take 1.25 mg by nebulization every 6 (six) hours.   montelukast (SINGULAIR) 10 MG tablet, TAKE 1 TABLET BY MOUTH AT  BEDTIME   SYMBICORT 160-4.5 MCG/ACT inhaler, USE 1  TO 2 INHALATIONS BY  MOUTH EVERY 12 HOURS AS  NEEDED  Current Outpatient Medications (Analgesics):    aspirin 81 MG tablet, Take 81 mg by mouth at bedtime.     HYDROcodone-acetaminophen (NORCO) 7.5-325 MG tablet, Take 1 tablet by mouth every 6 (six) hours as needed (for chronic joint pain).   Current Outpatient Medications (Other):    ACCU-CHEK AVIVA PLUS test strip, USE AS INSTRUCTED TO TEST BLOOD SUGAR 6 TIMES DAILY. DX CODE: E11.59   ACCU-CHEK SOFTCLIX LANCETS lancets, Use to test blood sugar daily as directed.   Continuous Blood Gluc Receiver (FREESTYLE LIBRE 14 DAY READER) DEVI, 1 Device by Does not apply route every 14 (fourteen) days.   Continuous Blood Gluc Sensor (FREESTYLE LIBRE 14 DAY SENSOR) MISC, 1 EACH BY DOES NOT APPLY ROUTE EVERY 14 (FOURTEEN) DAYS.   famotidine (PEPCID) 20 MG tablet, Take 1 tablet (20 mg total) by mouth daily.   LORazepam (ATIVAN) 1 MG tablet, TAKE 1/2 TO 1 TABLET BY MOUTH DAILY AS NEEDED.   Multiple Vitamin (MULTIVITAMIN WITH MINERALS) TABS tablet, Take 1 tablet by mouth daily.   Respiratory Therapy Supplies (FLUTTER) DEVI, Twice a day and prn as needed, may increase if feeling worse   zinc gluconate 50 MG tablet, Take 50 mg by mouth daily.   Reviewed prior external information including notes and imaging from  primary care provider As well as notes that were available from care everywhere and other healthcare  systems.  Past medical history, social, surgical and family history all reviewed in electronic medical record.  No pertanent information unless stated regarding to the chief complaint.   Review of Systems:  No headache, visual changes, nausea, vomiting, diarrhea, constipation, dizziness, abdominal pain, skin rash, fevers, chills, night sweats, weight loss, swollen lymph nodes, body aches, joint swelling, chest pain, shortness of breath, mood changes. POSITIVE muscle aches  Objective  There were no vitals taken for this visit.   General: No apparent distress alert and oriented x3 mood and affect normal, dressed appropriately.  HEENT: Pupils equal, extraocular movements intact  Respiratory: Patient's speak in full sentences and does not appear short of breath  Cardiovascular: No lower extremity edema, non tender, no erythema  Neuro: Cranial nerves II through XII are intact, neurovascularly intact in all extremities with 2+ DTRs and 2+ pulses.  Gait normal with good balance and coordination.  MSK:  Non tender with full range of motion and good stability and symmetric strength and tone of shoulders, elbows, wrist, hip, knee and ankles bilaterally.     Impression and Recommendations:     The above documentation has been reviewed and is accurate and complete Jacqualin Combes

## 2020-09-23 ENCOUNTER — Encounter: Payer: Self-pay | Admitting: Family Medicine

## 2020-09-23 ENCOUNTER — Ambulatory Visit: Payer: Medicare Other | Admitting: Family Medicine

## 2020-09-23 ENCOUNTER — Ambulatory Visit (INDEPENDENT_AMBULATORY_CARE_PROVIDER_SITE_OTHER): Payer: Medicare Other

## 2020-09-23 ENCOUNTER — Other Ambulatory Visit: Payer: Self-pay

## 2020-09-23 VITALS — BP 140/82 | HR 68 | Ht 69.5 in | Wt 189.0 lb

## 2020-09-23 DIAGNOSIS — M999 Biomechanical lesion, unspecified: Secondary | ICD-10-CM

## 2020-09-23 DIAGNOSIS — M542 Cervicalgia: Secondary | ICD-10-CM | POA: Diagnosis not present

## 2020-09-23 DIAGNOSIS — M9901 Segmental and somatic dysfunction of cervical region: Secondary | ICD-10-CM | POA: Diagnosis not present

## 2020-09-23 DIAGNOSIS — M5030 Other cervical disc degeneration, unspecified cervical region: Secondary | ICD-10-CM

## 2020-09-23 MED ORDER — TIZANIDINE HCL 4 MG PO TABS: 4.0000 mg | ORAL_TABLET | Freq: Every day | ORAL | 1 refills | Status: DC

## 2020-09-23 MED ORDER — GABAPENTIN 100 MG PO CAPS: 200.0000 mg | ORAL_CAPSULE | Freq: Every day | ORAL | 3 refills | Status: DC

## 2020-09-23 MED ORDER — MELOXICAM 15 MG PO TABS: 15.0000 mg | ORAL_TABLET | Freq: Every day | ORAL | 0 refills | Status: DC

## 2020-09-23 NOTE — Patient Instructions (Addendum)
Neck xray  Scapular exercises  15 mg of meloxicam daily for 10 days Gabapentin at night Zanaflex if you need extra See me again in 4 weeks

## 2020-09-23 NOTE — Progress Notes (Signed)
[place] [address] [place] Phone: [phone] Subjective:   I Jeffery [name] am serving as a Education administrator for Dr. [name].  This visit occurred during the SARS-CoV-2 public health emergency.  Safety protocols were in place, including screening questions prior to the visit, additional usage of staff PPE, and extensive cleaning of exam room while observing appropriate contact time as indicated for disinfecting solutions.   I'm seeing this patient by the request  of:  [name], MD  CC: Neck pain   LDJ:[id number]  Jeffery [name] is a 79 y.o. male coming in with complaint of neck pain. Patient states he has had pain for 3 months. Most of his pain is on the right side. Driving is uncomfortable. Rotation to the right is painful. Discomfort with sleeping and he gets headaches that radiate anteriorly. States he is very active. Has tried ice and heat for no more than 10 minutes. Uses ice more than heat.       Past Medical History:  Diagnosis Date  . Arthritis    "thumbs" (07/25/2018)  . Asthma   . CHF (congestive heart failure) ([place])   . Chronic bronchitis ([place])   . Environmental allergies   . GERD (gastroesophageal reflux disease)   . Headache    "seasonal; w/environmental allergies" (07/25/2018)  . History of blood transfusion    "when I had laminectomy" (07/25/2018)  . HTN (hypertension)   . Hyperlipidemia   . LBBB (left bundle branch block) 1999  . Metatarsal bone fracture 03/07/2018  . Myocardial infarction [facility])    "was told I've had an old MI; probably in the 15s" (07/25/2018)  . Pneumonia    "as a child, age 61; viral pneumonia 3 times in the last 10 years" (07/25/2018)  . Presence of permanent cardiac pacemaker 07/25/2018  . Seasonal allergies   . Sleep apnea    "wife says I do" (07/25/2018)  . Type II diabetes mellitus ([place])    Past Surgical History:  Procedure Laterality Date  . BACK SURGERY    . BIV PACEMAKER  INSERTION CRT-P  07/25/2018  . BIV PACEMAKER INSERTION CRT-P N/A 07/25/2018   Procedure: BIV PACEMAKER INSERTION CRT-P;  Surgeon: Evans Lance, MD;  Location: [place] [facility];  Service: Cardiovascular;  Laterality: N/A;  . CARDIAC CATHETERIZATION  2003   Dr Pernell Dupre; 85 % R circumflex obstruction  . CARPAL TUNNEL RELEASE Left 10/11/2014   Procedure: LEFT CARPAL TUNNEL RELEASE;  Surgeon: Roseanne Kaufman, MD;  Location: [place];  Service: Orthopedics;  Laterality: Left;  . COLONOSCOPY W/ BIOPSIES AND POLYPECTOMY  2018  . INGUINAL HERNIA REPAIR Right 1948  . INGUINAL HERNIA REPAIR Left 1988  . LUMBAR LAMINECTOMY  1984  . MINOR CARPAL TUNNEL Right 11/22/2014   Procedure: RIGHT LIMITED OPEN CARPAL TUNNEL RELEASE;  Surgeon: Roseanne Kaufman, MD;  Location: [place];  Service: Orthopedics;  Laterality: Right;  . TONSILLECTOMY  1958   Social History   Socioeconomic History  . Marital status: Married    Spouse name: Not on file  . Number of children: 3  . Years of education: Not on file  . Highest education level: Professional school degree (e.g., MD, DDS, DVM, JD)  Occupational History  . Occupation: retired Pension scheme manager: [employer]  Tobacco Use  . Smoking status: Former Smoker    Packs/day: 2.00    Years: 2.00    Pack years:  4.00    Types: Cigarettes    Quit date: 12/14/1963    Years since quitting: 56.8  . Smokeless tobacco: Never Used  Vaping Use  . Vaping Use: Never used  Substance and Sexual Activity  . Alcohol use: Not Currently    Alcohol/week: 2.0 standard drinks    Types: 2 Glasses of wine per week    Comment: occasional  . Drug use: Never  . Sexual activity: Yes  Other Topics Concern  . Not on file  Social History Narrative   Lives with wife in a 2 story home.  Has 3 daughters.  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     Social Determinants of Health   Financial Resource Strain:   . Difficulty of Paying Living  Expenses: Not on file  Food Insecurity:   . Worried About Charity fundraiser in the Last Year: Not on file  . Ran Out of Food in the Last Year: Not on file  Transportation Needs:   . Lack of Transportation (Medical): Not on file  . Lack of Transportation (Non-Medical): Not on file  Physical Activity:   . Days of Exercise per Week: Not on file  . Minutes of Exercise per Session: Not on file  Stress:   . Feeling of Stress : Not on file  Social Connections:   . Frequency of Communication with Friends and Family: Not on file  . Frequency of Social Gatherings with Friends and Family: Not on file  . Attends Religious Services: Not on file  . Active Member of Clubs or Organizations: Not on file  . Attends Archivist Meetings: Not on file  . Marital Status: Not on file   Allergies  Allergen Reactions  . Contrast Media [Iodinated Diagnostic Agents] Other (See Comments)    Redness and warm sensation, this reaction was noted on 02/27/13 during a Cardiac MRI per pt.  Pt sts he had erythema on his head, chest, and shoulders.  Pt had a pacemaker placed August 2019 and was pre-medicated for the dye and had no allergic reaction at that time. -Carissa Mozingo B.S. RT(R)(CT)   Family History  Problem Relation Age of Onset  . Heart attack Father        29s  . Colon polyps Father   . Heart failure Mother        90s  . Subarachnoid hemorrhage Mother 20  . Hypertension Mother   . Subarachnoid hemorrhage Paternal Grandfather 50  . Diabetes Maternal Aunt   . Colon cancer Neg Hx     Current Outpatient Medications (Endocrine & Metabolic):  .  metFORMIN (GLUCOPHAGE) 500 MG tablet, TAKE 1 TABLET BY MOUTH  DAILY WITH BREAKFAST  Current Outpatient Medications (Cardiovascular):  .  BYSTOLIC 5 MG tablet, TAKE 1 TABLET BY MOUTH  DAILY .  nitroGLYCERIN (NITROSTAT) 0.4 MG SL tablet, PLACE 1 TABLET (0.4 MG TOTAL) UNDER THE TONGUE EVERY 5 (FIVE) MINUTES X 3 DOSES AS NEEDED FOR CHEST PAIN. .   rosuvastatin (CRESTOR) 10 MG tablet, TAKE ONE TABLET BY MOUTH DAILY ON MONDAY, WEDNESDAY AND FRIDAY .  vardenafil (LEVITRA) 20 MG tablet, TAKE 1 TABLET BY MOUTH AS DIRECTED. CAN NOT BE TAKEN WITH NITROGLYCERIN. NOT COVERED BY INSURANCE  Current Outpatient Medications (Respiratory):  .  albuterol (PROAIR HFA) 108 (90 BASE) MCG/ACT inhaler, Inhale 2 puffs into the lungs every 6 (six) hours as needed for wheezing or shortness of breath.  [name]  albuterol (PROVENTIL) (2.5 MG/3ML) 0.083% nebulizer solution, TAKE 3 MLS  BY NEBULIZATION EVERY 6 (SIX) HOURS AS NEEDED FOR WHEEZING OR SHORTNESS OF BREATH. .  fexofenadine (ALLEGRA) 180 MG tablet, Take 180 mg by mouth daily as needed for allergies.  [name]  ipratropium (ATROVENT) 0.02 % nebulizer solution, Take 2.5 mLs (0.5 mg total) by nebulization every 6 (six) hours. [name]  levalbuterol (XOPENEX) 1.25 MG/3ML nebulizer solution, Take 1.25 mg by nebulization every 6 (six) hours. .  montelukast (SINGULAIR) 10 MG tablet, TAKE 1 TABLET BY MOUTH AT  BEDTIME .  SYMBICORT 160-4.5 MCG/ACT inhaler, USE 1 TO 2 INHALATIONS BY  MOUTH EVERY 12 HOURS AS  NEEDED  Current Outpatient Medications (Analgesics):  .  aspirin 81 MG tablet, Take 81 mg by mouth at bedtime.   [name]  HYDROcodone-acetaminophen (NORCO) 7.5-325 MG tablet, Take 1 tablet by mouth every 6 (six) hours as needed (for chronic joint pain). .  meloxicam (MOBIC) 15 MG tablet, Take 1 tablet (15 mg total) by mouth daily.   Current Outpatient Medications (Other):  [name]  ACCU-CHEK AVIVA PLUS test strip, USE AS INSTRUCTED TO TEST BLOOD SUGAR 6 TIMES DAILY. DX CODE: E11.59 .  ACCU-CHEK SOFTCLIX LANCETS lancets, Use to test blood sugar daily as directed. .  Continuous Blood Gluc Receiver (FREESTYLE LIBRE 14 DAY READER) DEVI, 1 Device by Does not apply route every 14 (fourteen) days. .  Continuous Blood Gluc Sensor (FREESTYLE LIBRE 14 DAY SENSOR) MISC, 1 EACH BY DOES NOT APPLY ROUTE EVERY 14 (FOURTEEN) DAYS. [name]  LORazepam (ATIVAN) 1 MG  tablet, TAKE 1/2 TO 1 TABLET BY MOUTH DAILY AS NEEDED. .  Multiple Vitamin (MULTIVITAMIN WITH MINERALS) TABS tablet, Take 1 tablet by mouth daily. [name]  Respiratory Therapy Supplies (FLUTTER) DEVI, Twice a day and prn as needed, may increase if feeling worse .  zinc gluconate 50 MG tablet, Take 50 mg by mouth daily. .  famotidine (PEPCID) 20 MG tablet, Take 1 tablet (20 mg total) by mouth daily. [name]  gabapentin (NEURONTIN) 100 MG capsule, Take 2 capsules (200 mg total) by mouth at bedtime. [name]  tiZANidine (ZANAFLEX) 4 MG tablet, Take 1 tablet (4 mg total) by mouth at bedtime.   Reviewed prior external information including notes and imaging from  primary care provider As well as notes that were available from care everywhere and other healthcare systems.  Past medical history, social, surgical and family history all reviewed in electronic medical record.  No pertanent information unless stated regarding to the chief complaint.   Review of Systems:  No headache, visual changes, nausea, vomiting, diarrhea, constipation, dizziness, abdominal pain, skin rash, fevers, chills, night sweats, weight loss, swollen lymph nodes, body aches, joint swelling, chest pain, shortness of breath, mood changes. POSITIVE muscle aches  Objective  Blood pressure 140/82, pulse 68, height 5' 9.5" (1.765 m), weight 189 lb (85.7 kg), SpO2 98 %.   General: No apparent distress alert and oriented x3 mood and affect normal, dressed appropriately.  HEENT: Pupils equal, extraocular movements intact  Respiratory: Patient's speak in full sentences and does not appear short of breath  Cardiovascular: No lower extremity edema, non tender, no erythema  Neuro: Cranial nerves II through XII are intact, neurovascularly intact in all extremities with 2+ DTRs and 2+ pulses.  Gait normal with good balance and coordination.  MSK: Mild arthritic changes of multiple joints Neck exam has very minimal range of motion of the spine.  Tender to  palpation diffusely.  He has less than 5 degrees of sidebending bilaterally.  Only has 5 degrees of extension  as well.  Negative Spurling's but significant pain.  Osteopathic findings C6 flexed rotated and side bent right T4 extended rotated and side   Impression and Recommendations:     The above documentation has been reviewed and is accurate and complete Lyndal Pulley, DO

## 2020-09-23 NOTE — Assessment & Plan Note (Addendum)
Severe.  Significant decrease in range of motion.  Short course of meloxicam, gabapentin, as well as Zanaflex.  Discussed with patient about potential side effects.  Patient is a retired Engineer, drilling and understands.  Patient given home exercises and work with Product/process development scientist.  X-rays are pending at this time.  Worsening symptoms will consider the possibility of physical therapy, advanced imaging, epidurals.  Follow-up 6 weeks

## 2020-09-23 NOTE — Assessment & Plan Note (Signed)
   Decision today to treat with OMT was based on Physical Exam ° °After verbal consent patient was treated with HVLA, ME, FPR techniques in cervical, thoracic,  areas, all areas are chronic  ° °Patient tolerated the procedure well with improvement in symptoms ° °Patient given exercises, stretches and lifestyle modifications ° °See medications in patient instructions if given ° °Patient will follow up in 4-8 weeks °

## 2020-09-25 ENCOUNTER — Other Ambulatory Visit: Payer: Self-pay

## 2020-09-25 ENCOUNTER — Telehealth: Payer: Self-pay | Admitting: Family Medicine

## 2020-09-25 DIAGNOSIS — M5030 Other cervical disc degeneration, unspecified cervical region: Secondary | ICD-10-CM

## 2020-09-25 DIAGNOSIS — M542 Cervicalgia: Secondary | ICD-10-CM

## 2020-09-25 DIAGNOSIS — G8929 Other chronic pain: Secondary | ICD-10-CM

## 2020-09-25 NOTE — Telephone Encounter (Signed)
Discussed with patient at this time.  Due to patient's pacemaker and likely being able to do an MRI but because of the time he can take I would like patient to have the CT scan first.  Patient agrees with that and then will discuss.  Likely just more of arthritic changes.  Patient knows if worsening pain seek medical attention immediately.  Hopefully will have CT scan done in the next days.

## 2020-09-25 NOTE — Telephone Encounter (Signed)
Patient called about MRI scheduling, [facility] will not move forward with scheduling until they receive reassurance regarding patient's pacemaker.  Per pt, pacemaker placed by Dr. Lovena Le at [facility] approx 2 years ago, if we need to confirm with him. It is patient's understanding that the pacemaker is a MRI safe [employer] unit.

## 2020-09-25 NOTE — Telephone Encounter (Signed)
It might be worth getting a CT scan of the neck first just due to time. Want to as k patient?  Then I would move the MRI to [facility] or [facility] I think and can we call [facility] and see if they will say it is ok?

## 2020-09-25 NOTE — Telephone Encounter (Signed)
Patient saw results on Mychart. MRI order sent.

## 2020-09-25 NOTE — Telephone Encounter (Signed)
Jeffery [name] from [facility] called regarding cervical spine result from 10/13, results in Epic.

## 2020-09-25 NOTE — Telephone Encounter (Signed)
Noted need mri

## 2020-09-25 NOTE — Telephone Encounter (Signed)
CT scan ordered for patient.

## 2020-09-26 ENCOUNTER — Other Ambulatory Visit: Payer: Self-pay | Admitting: Internal Medicine

## 2020-09-29 NOTE — Telephone Encounter (Signed)
Patient called after hours and left a message stating that he has not received a call to schedule his CT Scan. Wanted to make sure all was good to go before I told him to contact their office directly.  Please advise.

## 2020-09-29 NOTE — Telephone Encounter (Signed)
Sent patient CT phone number through [facility].

## 2020-10-01 ENCOUNTER — Other Ambulatory Visit: Payer: Self-pay | Admitting: Internal Medicine

## 2020-10-01 MED ORDER — HYDROCODONE-ACETAMINOPHEN 7.5-325 MG PO TABS
1.0000 | ORAL_TABLET | Freq: Four times a day (QID) | ORAL | 0 refills | Status: DC | PRN
Start: 2020-10-01 — End: 2020-11-08

## 2020-10-03 ENCOUNTER — Other Ambulatory Visit: Payer: Self-pay

## 2020-10-03 ENCOUNTER — Ambulatory Visit (INDEPENDENT_AMBULATORY_CARE_PROVIDER_SITE_OTHER)
Admission: RE | Admit: 2020-10-03 | Discharge: 2020-10-03 | Disposition: A | Discharge status: Home / Self Care | Payer: Medicare Other | Source: Ambulatory Visit | Attending: Family Medicine | Admitting: Family Medicine

## 2020-10-03 DIAGNOSIS — M542 Cervicalgia: Secondary | ICD-10-CM | POA: Diagnosis not present

## 2020-10-03 DIAGNOSIS — M5030 Other cervical disc degeneration, unspecified cervical region: Secondary | ICD-10-CM

## 2020-10-06 IMAGING — DX DG CHEST 2V
2 series · 2 of 2 positions shown · non-contrast
Comparison: 11/17/2018

CLINICAL DATA: Cough and congestion.

EXAM:
CHEST - 2 VIEW

[chest pa]
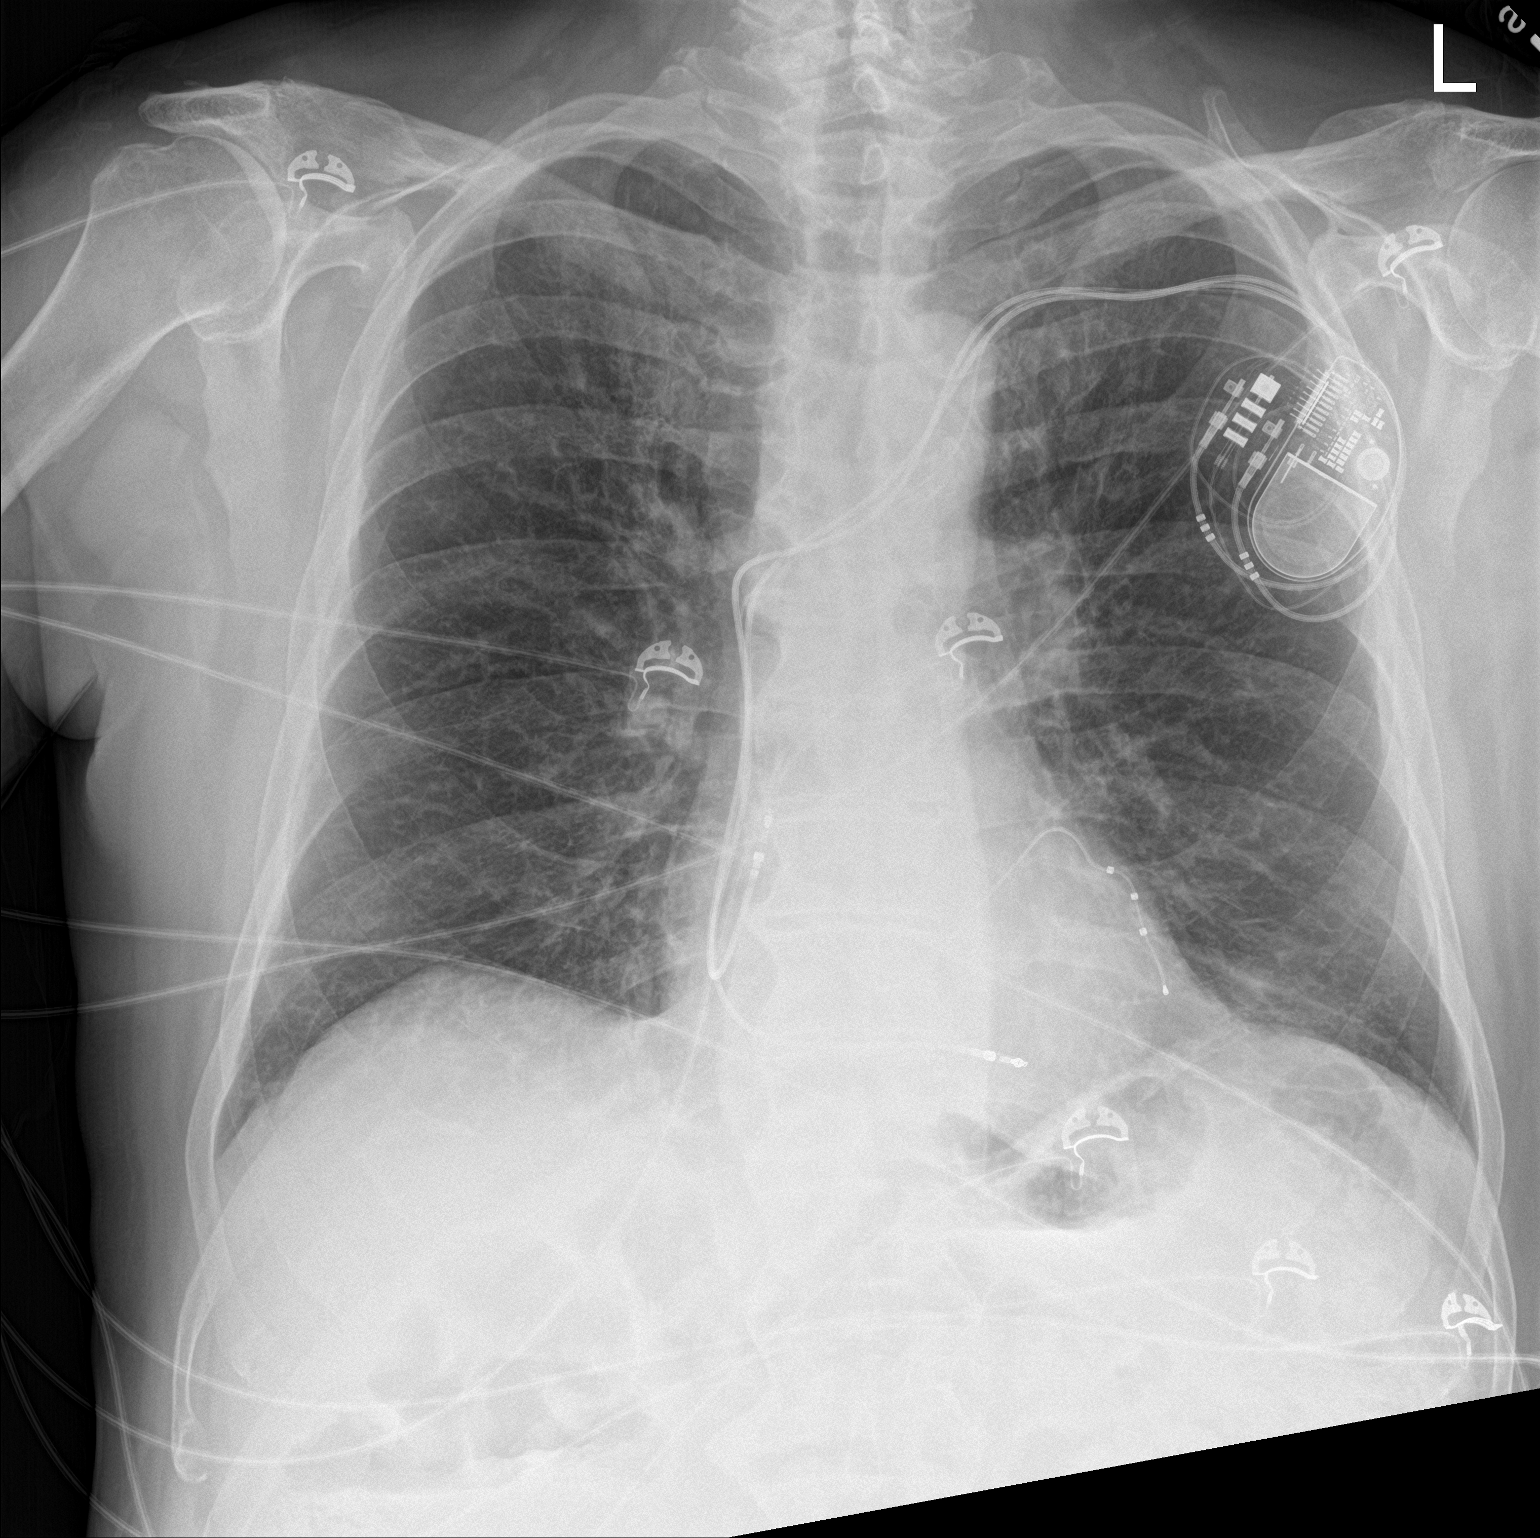

[chest lat]
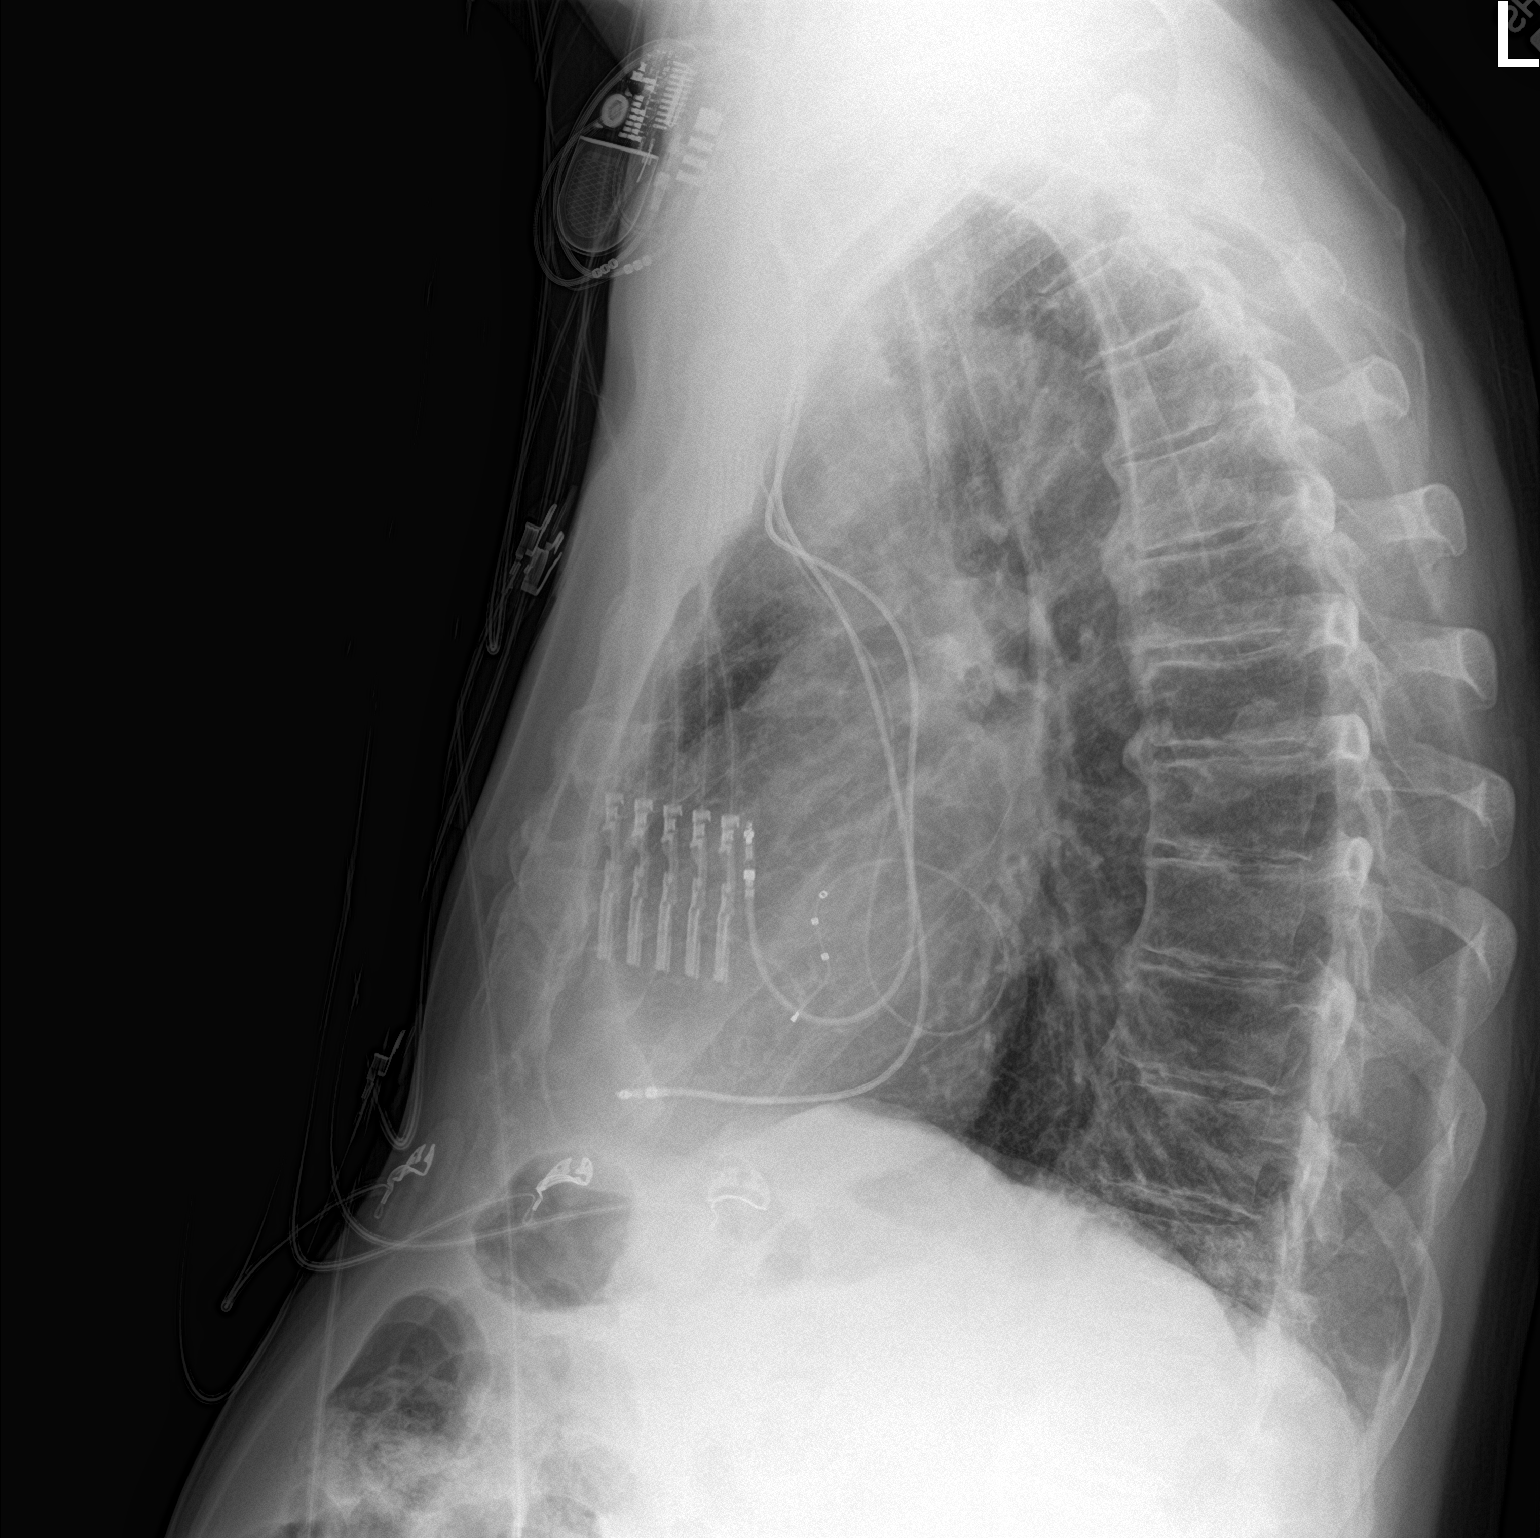

[2 of 2 positions shown; findings below may reference images not displayed]

FINDINGS: There is a left chest wall pacer device with leads in the right
atrial appendage and right ventricle. Normal heart size. No pleural
effusion or edema. No airspace opacities identified. Osseous
structures are unremarkable.
IMPRESSION: No active cardiopulmonary disease.

## 2020-10-12 ENCOUNTER — Encounter: Payer: Self-pay | Admitting: Family Medicine

## 2020-10-15 ENCOUNTER — Other Ambulatory Visit: Payer: Self-pay | Admitting: Family Medicine

## 2020-10-16 ENCOUNTER — Other Ambulatory Visit: Payer: Self-pay

## 2020-10-16 DIAGNOSIS — M5030 Other cervical disc degeneration, unspecified cervical region: Secondary | ICD-10-CM

## 2020-10-20 ENCOUNTER — Other Ambulatory Visit: Payer: Self-pay | Admitting: Family Medicine

## 2020-10-22 NOTE — Progress Notes (Signed)
[facility] [address] [place] Phone: [phone] Subjective:   I [name] am serving as a Education administrator for Dr. [name].  This visit occurred during the SARS-CoV-2 public health emergency.  Safety protocols were in place, including screening questions prior to the visit, additional usage of staff PPE, and extensive cleaning of exam room while observing appropriate contact time as indicated for disinfecting solutions.   I'm seeing this patient by the request  of:  [name], MD  CC: Neck pain follow-up  JIR:[id number]   09/23/2020 Severe.  Significant decrease in range of motion.  Short course of meloxicam, gabapentin, as well as Zanaflex.  Discussed with patient about potential side effects.  Patient is a retired Engineer, drilling and understands.  Patient given home exercises and work with Product/process development scientist.  X-rays are pending at this time.  Worsening symptoms will consider the possibility of physical therapy, advanced imaging, epidurals.  Follow-up 6 weeks  Update 10/23/2020 RAJI GLINSKI is a 79 y.o. male coming in with complaint of neck pain. Patient states he has good days and bad days. Still having pain. States by the end of the day he is taking meds for pain.  Patient's work-up has been somewhat alarming.  Patient did have neck x-rays at last follow-up but was found to have widening of the atlantodental interval with anterior displacement of the cranium in relation to the odontoid process.  Advanced degenerative disc disease at multiple levels of the cervical spine.  Patient was then sent for a CT scan of the neck.  CT scan of the neck showed that patient had a prominent soft tissue pannus surrounding the dens resulting in moderate canal stenosis at C1-C2 with slight kinking of the cervical cord.  The pain is also resultant widening of the atlantodental interval can be secondary to rheumatoid arthritis, CPPD or gout with severe  arthritic changes with moderate canal stenosis from C5-C7  Patient feels like the gabapentin has not been beneficial.  Feels like he gets more improvement with Excedrin over-the-counter patient is wanting to know prognosis and what is the next actions at this time.  Patient does have tingling in his hands but states that he has had that for years and does not know if it is from the neck or not.  Denies any weakness.      Past Medical History:  Diagnosis Date  . Arthritis    "thumbs" (07/25/2018)  . Asthma   . CHF (congestive heart failure) ([place])   . Chronic bronchitis ([place])   . Environmental allergies   . GERD (gastroesophageal reflux disease)   . Headache    "seasonal; w/environmental allergies" (07/25/2018)  . History of blood transfusion    "when I had laminectomy" (07/25/2018)  . HTN (hypertension)   . Hyperlipidemia   . LBBB (left bundle branch block) 1999  . Metatarsal bone fracture 03/07/2018  . Myocardial infarction [facility])    "was told I've had an old MI; probably in the 81s" (07/25/2018)  . Pneumonia    "as a child, age 31; viral pneumonia 3 times in the last 10 years" (07/25/2018)  . Presence of permanent cardiac pacemaker 07/25/2018  . Seasonal allergies   . Sleep apnea    "wife says I do" (07/25/2018)  . Type II diabetes mellitus ([place])    Past Surgical History:  Procedure Laterality Date  . BACK SURGERY    . BIV PACEMAKER INSERTION CRT-P  07/25/2018  . BIV  PACEMAKER INSERTION CRT-P N/A 07/25/2018   Procedure: BIV PACEMAKER INSERTION CRT-P;  Surgeon: Evans Lance, MD;  Location: [facility];  Service: Cardiovascular;  Laterality: N/A;  . CARDIAC CATHETERIZATION  2003   Dr Pernell Dupre; 85 % R circumflex obstruction  . CARPAL TUNNEL RELEASE Left 10/11/2014   Procedure: LEFT CARPAL TUNNEL RELEASE;  Surgeon: Roseanne Kaufman, MD;  Location: [facility];  Service: Orthopedics;  Laterality: Left;  . COLONOSCOPY W/ BIOPSIES AND POLYPECTOMY  2018  .  INGUINAL HERNIA REPAIR Right 1948  . INGUINAL HERNIA REPAIR Left 1988  . LUMBAR LAMINECTOMY  1984  . MINOR CARPAL TUNNEL Right 11/22/2014   Procedure: RIGHT LIMITED OPEN CARPAL TUNNEL RELEASE;  Surgeon: Roseanne Kaufman, MD;  Location: [facility];  Service: Orthopedics;  Laterality: Right;  . TONSILLECTOMY  1958   Social History   Socioeconomic History  . Marital status: Married    Spouse name: Not on file  . Number of children: 3  . Years of education: Not on file  . Highest education level: Professional school degree (e.g., MD, DDS, DVM, JD)  Occupational History  . Occupation: retired Pension scheme manager: [employer]  Tobacco Use  . Smoking status: Former Smoker    Packs/day: 2.00    Years: 2.00    Pack years: 4.00    Types: Cigarettes    Quit date: 12/14/1963    Years since quitting: 56.8  . Smokeless tobacco: Never Used  Vaping Use  . Vaping Use: Never used  Substance and Sexual Activity  . Alcohol use: Not Currently    Alcohol/week: 2.0 standard drinks    Types: 2 Glasses of wine per week    Comment: occasional  . Drug use: Never  . Sexual activity: Yes  Other Topics Concern  . Not on file  Social History Narrative   Lives with wife in a 2 story home.  Has 3 daughters.  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     Social Determinants of Health   Financial Resource Strain:   . Difficulty of Paying Living Expenses: Not on file  Food Insecurity:   . Worried About Charity fundraiser in the Last Year: Not on file  . Ran Out of Food in the Last Year: Not on file  Transportation Needs:   . Lack of Transportation (Medical): Not on file  . Lack of Transportation (Non-Medical): Not on file  Physical Activity:   . Days of Exercise per Week: Not on file  . Minutes of Exercise per Session: Not on file  Stress:   . Feeling of Stress : Not on file  Social Connections:   . Frequency of Communication with Friends and Family: Not on file  . Frequency of  Social Gatherings with Friends and Family: Not on file  . Attends Religious Services: Not on file  . Active Member of Clubs or Organizations: Not on file  . Attends Archivist Meetings: Not on file  . Marital Status: Not on file   Allergies  Allergen Reactions  . Contrast Media [Iodinated Diagnostic Agents] Other (See Comments)    Redness and warm sensation, this reaction was noted on 02/27/13 during a Cardiac MRI per pt.  Pt sts he had erythema on his head, chest, and shoulders.  Pt had a pacemaker placed August 2019 and was pre-medicated for the dye and had no allergic reaction at that time. -Carissa Mozingo B.S. RT(R)(CT)   Family History  Problem Relation Age of Onset  . Heart attack Father        65s  . Colon polyps Father   . Heart failure Mother        90s  . Subarachnoid hemorrhage Mother 4  . Hypertension Mother   . Subarachnoid hemorrhage Paternal Grandfather 75  . Diabetes Maternal Aunt   . Colon cancer Neg Hx     Current Outpatient Medications (Endocrine & Metabolic):  .  metFORMIN (GLUCOPHAGE) 500 MG tablet, TAKE 1 TABLET BY MOUTH  DAILY WITH BREAKFAST  Current Outpatient Medications (Cardiovascular):  .  BYSTOLIC 5 MG tablet, TAKE 1 TABLET BY MOUTH  DAILY .  nitroGLYCERIN (NITROSTAT) 0.4 MG SL tablet, PLACE 1 TABLET (0.4 MG TOTAL) UNDER THE TONGUE EVERY 5 (FIVE) MINUTES X 3 DOSES AS NEEDED FOR CHEST PAIN. .  rosuvastatin (CRESTOR) 10 MG tablet, TAKE ONE TABLET BY MOUTH DAILY ON MONDAY, WEDNESDAY AND FRIDAY .  vardenafil (LEVITRA) 20 MG tablet, TAKE 1 TABLET BY MOUTH AS DIRECTED. CAN NOT BE TAKEN WITH NITROGLYCERIN. NOT COVERED BY INSURANCE  Current Outpatient Medications (Respiratory):  .  albuterol (PROAIR HFA) 108 (90 BASE) MCG/ACT inhaler, Inhale 2 puffs into the lungs every 6 (six) hours as needed for wheezing or shortness of breath.  [name]  albuterol (PROVENTIL) (2.5 MG/3ML) 0.083% nebulizer solution, TAKE 3 MLS BY NEBULIZATION EVERY 6 (SIX) HOURS AS  NEEDED FOR WHEEZING OR SHORTNESS OF BREATH. .  fexofenadine (ALLEGRA) 180 MG tablet, Take 180 mg by mouth daily as needed for allergies.  [name]  ipratropium (ATROVENT) 0.02 % nebulizer solution, Take 2.5 mLs (0.5 mg total) by nebulization every 6 (six) hours. [name]  levalbuterol (XOPENEX) 1.25 MG/3ML nebulizer solution, Take 1.25 mg by nebulization every 6 (six) hours. .  montelukast (SINGULAIR) 10 MG tablet, TAKE 1 TABLET BY MOUTH AT  BEDTIME .  SYMBICORT 160-4.5 MCG/ACT inhaler, USE 1 TO 2 INHALATIONS BY  MOUTH EVERY 12 HOURS AS  NEEDED  Current Outpatient Medications (Analgesics):  .  aspirin 81 MG tablet, Take 81 mg by mouth at bedtime.   [name]  HYDROcodone-acetaminophen (NORCO) 7.5-325 MG tablet, Take 1 tablet by mouth every 6 (six) hours as needed (for chronic joint pain). .  celecoxib (CELEBREX) 100 MG capsule, Take 1 capsule (100 mg total) by mouth daily.   Current Outpatient Medications (Other):  [name]  ACCU-CHEK AVIVA PLUS test strip, USE AS INSTRUCTED TO TEST BLOOD SUGAR 6 TIMES DAILY. DX CODE: E11.59 .  ACCU-CHEK SOFTCLIX LANCETS lancets, Use to test blood sugar daily as directed. .  Continuous Blood Gluc Receiver (FREESTYLE LIBRE 14 DAY READER) DEVI, 1 Device by Does not apply route every 14 (fourteen) days. .  Continuous Blood Gluc Sensor (FREESTYLE LIBRE 14 DAY SENSOR) MISC, APPLY 1 SENSOR EVERY 14 DAYS .  gabapentin (NEURONTIN) 100 MG capsule, Take 2 capsules (200 mg total) by mouth at bedtime. [name]  LORazepam (ATIVAN) 1 MG tablet, TAKE 1/2 TO 1 TABLET BY MOUTH DAILY AS NEEDED. .  Multiple Vitamin (MULTIVITAMIN WITH MINERALS) TABS tablet, Take 1 tablet by mouth daily. [name]  Respiratory Therapy Supplies (FLUTTER) DEVI, Twice a day and prn as needed, may increase if feeling worse .  tiZANidine (ZANAFLEX) 4 MG tablet, TAKE 1 TABLET BY MOUTH AT BEDTIME. [name]  zinc gluconate 50 MG tablet, Take 50 mg by mouth daily. .  famotidine (PEPCID) 20 MG tablet, Take 1 tablet (20 mg total) by mouth  daily.   Reviewed prior external information including notes and imaging from  primary care provider As well as notes that were available from care everywhere and other healthcare systems.  Past medical history, social, surgical and family history all reviewed in electronic medical record.  No pertanent information unless stated regarding to the chief complaint.   Review of Systems:  No , visual changes, nausea, vomiting, diarrhea, constipation, dizziness, abdominal pain, skin rash, fevers, chills, night sweats, weight loss, swollen lymph nodes, body aches, joint swelling, chest pain, shortness of breath, mood changes. POSITIVE muscle aches, headache  Objective  Blood pressure (!) 148/80, pulse (!) 59, height 5' 9.5" (1.765 m), weight 193 lb (87.5 kg), SpO2 97 %.   General: No apparent distress alert and oriented x3 mood and affect normal, dressed appropriately.  HEENT: Pupils equal, extraocular movements intact  Respiratory: Patient's speak in full sentences and does not appear short of breath  Arthritic changes of multiple joints.  His neck hurts more on the right side mostly around C2-C3.  Limited range of motion in all planes.  Patient does have good grip strength noted and seems to be neurovascularly intact in the upper extremities    Impression and Recommendations:     The above documentation has been reviewed and is accurate and complete Lyndal Pulley, DO

## 2020-10-23 ENCOUNTER — Encounter: Payer: Self-pay | Admitting: Family Medicine

## 2020-10-23 ENCOUNTER — Other Ambulatory Visit: Payer: Self-pay

## 2020-10-23 ENCOUNTER — Ambulatory Visit: Payer: Medicare Other | Admitting: Family Medicine

## 2020-10-23 VITALS — BP 148/80 | HR 59 | Ht 69.5 in | Wt 193.0 lb

## 2020-10-23 DIAGNOSIS — M5030 Other cervical disc degeneration, unspecified cervical region: Secondary | ICD-10-CM

## 2020-10-23 DIAGNOSIS — M542 Cervicalgia: Secondary | ICD-10-CM

## 2020-10-23 MED ORDER — CELECOXIB 100 MG PO CAPS: 100.0000 mg | ORAL_CAPSULE | Freq: Every day | ORAL | 1 refills | Status: DC

## 2020-10-23 NOTE — Patient Instructions (Addendum)
  Good to see you  Number to call for the MRI is [phone]. Referral to neurosurgery sent in 1 Celebrex daily discontinue meloxicam and Excedrin Discontinue gabapentin  Send me an update in Mychart in 2 weeks

## 2020-10-23 NOTE — Assessment & Plan Note (Signed)
Severe overall.  At this point I would like to refer patient to neurosurgery.  We have discussed this over my chart and I do feel fine further evaluation to make sure that this can be treated conservatively or if any intervention is necessary.  We discussed multiple different medications with patient not responding previously to the gabapentin.  Patient is concerned the Cymbalta with his cardiac history.  Patient though is taking Excedrin fairly regularly.  Discontinued all other anti-inflammatories and will do a low-dose of Celebrex just 1 time daily.  We discussed the potential for the cardiovascular risk factor of this.  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  Patient also felt more comfortable with this than the Cymbalta that was suggested.  Patient was concerned with a QT prolongation.  Patient can check with his cardiologist.  Patient does get his pain medications from another provider and can take that for the breakthrough pain.  Patient does have some Zanaflex as well he can take at night if necessary.  Encourage him to continue to do the exercises more just range of motion with minimal lifting until further evaluation by neurosurgery patient will follow up with me again in 2 to 3 months

## 2020-10-31 ENCOUNTER — Ambulatory Visit (INDEPENDENT_AMBULATORY_CARE_PROVIDER_SITE_OTHER): Payer: Medicare Other

## 2020-10-31 DIAGNOSIS — I5022 Chronic systolic (congestive) heart failure: Secondary | ICD-10-CM

## 2020-10-31 DIAGNOSIS — I428 Other cardiomyopathies: Secondary | ICD-10-CM | POA: Diagnosis not present

## 2020-10-31 LAB — CUP PACEART REMOTE DEVICE CHECK
Battery Remaining Longevity: 92 mo
Battery Remaining Percentage: 95.5 %
Battery Voltage: 2.98 V
Brady Statistic AP VP Percent: 32 %
Brady Statistic AP VS Percent: 1 %
Brady Statistic AS VP Percent: 67 %
Brady Statistic AS VS Percent: 1 %
Brady Statistic RA Percent Paced: 32 %
Date Time Interrogation Session: 20211119020009
Implantable Lead Implant Date: 20190813
Implantable Lead Implant Date: 20190813
Implantable Lead Implant Date: 20190813
Implantable Lead Location: 753858
Implantable Lead Location: 753859
Implantable Lead Location: 753860
Implantable Pulse Generator Implant Date: 20190813
Lead Channel Impedance Value: 400 Ohm
Lead Channel Impedance Value: 430 Ohm
Lead Channel Impedance Value: 790 Ohm
Lead Channel Pacing Threshold Amplitude: 0.5 V
Lead Channel Pacing Threshold Amplitude: 0.625 V
Lead Channel Pacing Threshold Amplitude: 1.375 V
Lead Channel Pacing Threshold Pulse Width: 0.5 ms
Lead Channel Pacing Threshold Pulse Width: 0.5 ms
Lead Channel Pacing Threshold Pulse Width: 0.5 ms
Lead Channel Sensing Intrinsic Amplitude: 11.7 mV
Lead Channel Sensing Intrinsic Amplitude: 2.1 mV
Lead Channel Setting Pacing Amplitude: 1.625
Lead Channel Setting Pacing Amplitude: 2.375
Lead Channel Setting Pacing Amplitude: 2.5 V
Lead Channel Setting Pacing Pulse Width: 0.5 ms
Lead Channel Setting Pacing Pulse Width: 0.5 ms
Lead Channel Setting Sensing Sensitivity: 5 mV
Pulse Gen Model: 3562
Pulse Gen Serial Number: 9043864

## 2020-11-03 NOTE — Progress Notes (Signed)
Remote pacemaker transmission.   

## 2020-11-05 ENCOUNTER — Encounter: Payer: Self-pay | Admitting: Internal Medicine

## 2020-11-05 NOTE — Telephone Encounter (Signed)
Please advise 

## 2020-11-08 ENCOUNTER — Other Ambulatory Visit: Payer: Self-pay | Admitting: Internal Medicine

## 2020-11-08 ENCOUNTER — Other Ambulatory Visit: Payer: Self-pay | Admitting: Family Medicine

## 2020-11-10 MED ORDER — HYDROCODONE-ACETAMINOPHEN 7.5-325 MG PO TABS: 1.0000 | ORAL_TABLET | Freq: Four times a day (QID) | ORAL | 0 refills | Status: DC | PRN

## 2020-11-10 NOTE — Telephone Encounter (Signed)
Check [name] registry last filled 10/01/2020.Marland KitchenJohny [name]

## 2020-11-11 ENCOUNTER — Other Ambulatory Visit: Payer: Self-pay | Admitting: Family Medicine

## 2020-11-12 DIAGNOSIS — M532X1 Spinal instabilities, occipito-atlanto-axial region: Secondary | ICD-10-CM | POA: Diagnosis not present

## 2020-11-18 ENCOUNTER — Encounter: Payer: Medicare Other | Admitting: Internal Medicine

## 2020-11-26 ENCOUNTER — Ambulatory Visit: Payer: Medicare Other | Admitting: Physical Therapy

## 2020-11-26 ENCOUNTER — Other Ambulatory Visit: Payer: Self-pay

## 2020-11-26 DIAGNOSIS — M542 Cervicalgia: Secondary | ICD-10-CM

## 2020-11-26 NOTE — Progress Notes (Deleted)
Cardiology Office Note Date:  11/26/2020  Patient ID:  Jeffery [name], DOB 08-Oct-1941, MRN [id number] PCP:  [name], MD  Cardiologist:  Dr. Tamala Julian EP: Dr. Lovena Le    Chief Complaint:  *** pacer check  History of Present Illness: Jeffery [name] is a 79 y.o. 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  He comes in today to be seen for Dr. Lovena Le, last seen by him Aug 2020.  t that time doing well, class I symptoms, device function reported as functioning normally, no changes were made.  Most recently he saw Dr. Tamala Julian May 2021, he was feeling well, mentioned diaphragmatic stim when squatting and bending forward.  The patient called the office noting that when doing some activities in the yard (mowing grass etc) using gas powered machines he had been feeling unusually labored, easily winded and had noted that his HR was dropping to high 40's- low 50's.  His normal noted exercise HR typically 100-110.  When he started to use the lawnmower again, he again felt the same. device transmission noted  A lead noise, RN also mentioned some intermittent V noise. Timing could not be clearly verified with lawn mower , there was some mention of a home massager use.  I saw him 07/24/2020 He discusses as noted above.  This is the only time he has noted this.  He had no CP, did not feel dizzy or lightheaded.  Was push mowing and is up/down a hill and labor intensive.  He has not had difficulty or similar symptoms before with this activity.  He has not mowed the lawn yet again. He denies any symptoms when in the pool or exerting himself otherwise, feels like he has very good exertional capacity He thinks he may have been having ectopy, PVCs He denies any dizzy spells, no near syncope or syncope  Device check  noted  He has had A lead noise reversions He has had V lead noise as well though not seen by the device Measurements are good Unable to reproduce today Some  Are clearly EMI others are larger brief intermittent noise and seen on BOTH leads (A lead signal is large though noted on both) He has not had any V noise reversions Last was noted 8/10, timing correlates with time he was in the pool I adjusted RV lead sensitivity to 54mV given device dependent [employer]) rep came and evaluated EGMs agrees these are external, no evidence of lead malfunction.discussed external source may be his pool and has offered to go to his home to evaluate his device/EGMs when in the pool. Phrenic/diaphragmatic stim only when in a stooped and reaching type position, no LV lead programming changes were made.  Discussed avoiding this position Uncertain what his symtom while mowing the lawn was He has not had an device noted V noise or V reversions, device is functioning appropriately He does not have recurrent symptoms He wonders about ectopy at the time He V paces 99%, so PVC burden is low overall, no HVR episodes or AF Discussed possible angina, but this is a one time eveny to date He will monitor and reach out to Dr. Tamala Julian if any recurrent symptoms Planned to see Dr. Lovena Le in f/u though does not look like he did  *** pool checked? *** noise? *** recurrent symptoms?   Device  information [employer] CRT-P implanted 07/25/2018   Past Medical History:  Diagnosis Date  . Arthritis    "thumbs" (07/25/2018)  . Asthma   . CHF (congestive heart failure) ([place])   . Chronic bronchitis ([place])   . Environmental allergies   . GERD (gastroesophageal reflux disease)   . Headache    "seasonal; w/environmental allergies" (07/25/2018)  . History of blood transfusion    "when I had laminectomy" (07/25/2018)  . HTN (hypertension)   . Hyperlipidemia   . LBBB (left bundle branch block) 1999  . Metatarsal bone fracture 03/07/2018   . Myocardial infarction [facility])    "was told I've had an old MI; probably in the 81s" (07/25/2018)  . Pneumonia    "as a child, age 82; viral pneumonia 3 times in the last 10 years" (07/25/2018)  . Presence of permanent cardiac pacemaker 07/25/2018  . Seasonal allergies   . Sleep apnea    "wife says I do" (07/25/2018)  . Type II diabetes mellitus ([place])     Past Surgical History:  Procedure Laterality Date  . BACK SURGERY    . BIV PACEMAKER INSERTION CRT-P  07/25/2018  . BIV PACEMAKER INSERTION CRT-P N/A 07/25/2018   Procedure: BIV PACEMAKER INSERTION CRT-P;  Surgeon: Evans Lance, MD;  Location: [facility];  Service: Cardiovascular;  Laterality: N/A;  . CARDIAC CATHETERIZATION  2003   Dr Pernell Dupre; 85 % R circumflex obstruction  . CARPAL TUNNEL RELEASE Left 10/11/2014   Procedure: LEFT CARPAL TUNNEL RELEASE;  Surgeon: Roseanne Kaufman, MD;  Location: [place];  Service: Orthopedics;  Laterality: Left;  . COLONOSCOPY W/ BIOPSIES AND POLYPECTOMY  2018  . INGUINAL HERNIA REPAIR Right 1948  . INGUINAL HERNIA REPAIR Left 1988  . LUMBAR LAMINECTOMY  1984  . MINOR CARPAL TUNNEL Right 11/22/2014   Procedure: RIGHT LIMITED OPEN CARPAL TUNNEL RELEASE;  Surgeon: Roseanne Kaufman, MD;  Location: [place];  Service: Orthopedics;  Laterality: Right;  . TONSILLECTOMY  1958    Current Outpatient Medications  Medication Sig Dispense Refill  . ACCU-CHEK AVIVA PLUS test strip USE AS INSTRUCTED TO TEST BLOOD SUGAR 6 TIMES DAILY. DX CODE: E11.59 500 strip 4  . ACCU-CHEK SOFTCLIX LANCETS lancets Use to test blood sugar daily as directed. 100 each 3  . albuterol (PROAIR HFA) 108 (90 BASE) MCG/ACT inhaler Inhale 2 puffs into the lungs every 6 (six) hours as needed for wheezing or shortness of breath.     [name] albuterol (PROVENTIL) (2.5 MG/3ML) 0.083% nebulizer solution TAKE 3 MLS BY NEBULIZATION EVERY 6 (SIX) HOURS AS NEEDED FOR WHEEZING OR SHORTNESS OF BREATH. 150 mL 5   . aspirin 81 MG tablet Take 81 mg by mouth at bedtime.      [name] BYSTOLIC 5 MG tablet TAKE 1 TABLET BY MOUTH  DAILY 90 tablet 2  . celecoxib (CELEBREX) 100 MG capsule Take 1 capsule (100 mg total) by mouth daily. 30 capsule 1  . Continuous Blood Gluc Receiver (FREESTYLE LIBRE 14 DAY READER) DEVI 1 Device by Does not apply route every 14 (fourteen) days. 1 Device 0  . Continuous Blood Gluc Sensor (FREESTYLE LIBRE 14 DAY SENSOR) MISC APPLY 1 SENSOR EVERY 14 DAYS 2 each 2  . famotidine (PEPCID) 20 MG tablet Take 1 tablet (20 mg total) by mouth daily. 30 tablet 0  . fexofenadine (ALLEGRA) 180 MG tablet Take 180 mg by mouth daily as needed for allergies.     [name] gabapentin (NEURONTIN) 100 MG  capsule Take 2 capsules (200 mg total) by mouth at bedtime. 180 capsule 3  . HYDROcodone-acetaminophen (NORCO) 7.5-325 MG tablet Take 1 tablet by mouth every 6 (six) hours as needed (for chronic joint pain). 60 tablet 0  . ipratropium (ATROVENT) 0.02 % nebulizer solution Take 2.5 mLs (0.5 mg total) by nebulization every 6 (six) hours. 75 mL 3  . levalbuterol (XOPENEX) 1.25 MG/3ML nebulizer solution Take 1.25 mg by nebulization every 6 (six) hours. 120 mL 3  . LORazepam (ATIVAN) 1 MG tablet TAKE 1/2 TO 1 TABLET BY MOUTH DAILY AS NEEDED. 30 tablet 5  . metFORMIN (GLUCOPHAGE) 500 MG tablet TAKE 1 TABLET BY MOUTH  DAILY WITH BREAKFAST 90 tablet 3  . montelukast (SINGULAIR) 10 MG tablet TAKE 1 TABLET BY MOUTH AT  BEDTIME 90 tablet 3  . Multiple Vitamin (MULTIVITAMIN WITH MINERALS) TABS tablet Take 1 tablet by mouth daily.    . nitroGLYCERIN (NITROSTAT) 0.4 MG SL tablet PLACE 1 TABLET (0.4 MG TOTAL) UNDER THE TONGUE EVERY 5 (FIVE) MINUTES X 3 DOSES AS NEEDED FOR CHEST PAIN. 75 tablet 1  . Respiratory Therapy Supplies (FLUTTER) DEVI Twice a day and prn as needed, may increase if feeling worse 1 each 0  . rosuvastatin (CRESTOR) 10 MG tablet TAKE ONE TABLET BY MOUTH DAILY ON MONDAY, WEDNESDAY AND FRIDAY 45 tablet 3  . SYMBICORT  160-4.5 MCG/ACT inhaler USE 1 TO 2 INHALATIONS BY  MOUTH EVERY 12 HOURS AS  NEEDED 30.6 g 3  . tiZANidine (ZANAFLEX) 4 MG tablet TAKE 1 TABLET BY MOUTH AT BEDTIME. 90 tablet 1  . vardenafil (LEVITRA) 20 MG tablet TAKE 1 TABLET BY MOUTH AS DIRECTED. CAN NOT BE TAKEN WITH NITROGLYCERIN. NOT COVERED BY INSURANCE 6 tablet 2  . zinc gluconate 50 MG tablet Take 50 mg by mouth daily.     No current facility-administered medications for this visit.    Allergies:   Contrast media [iodinated diagnostic agents]   Social History:  The patient  reports that he quit smoking about 56 years ago. His smoking use included cigarettes. He has a 4.00 pack-year smoking history. He has never used smokeless tobacco. He reports previous alcohol use of about 2.0 standard drinks of alcohol per week. He reports that he does not use drugs.   Family History:  The patient's family history includes Colon polyps in his father; Diabetes in his maternal aunt; Heart attack in his father; Heart failure in his mother; Hypertension in his mother; Subarachnoid hemorrhage (age of onset: 80) in his mother; Subarachnoid hemorrhage (age of onset: 7) in his paternal grandfather.  ROS:  Please see the history of present illness.  All other systems are reviewed and otherwise negative.   PHYSICAL EXAM:  VS:  There were no vitals taken for this visit. BMI: There is no height or weight on file to calculate BMI. Well nourished, well developed, in no acute distress  HEENT: normocephalic, atraumatic  Neck: no JVD, carotid bruits or masses Cardiac:  *** RRR; no significant murmurs, no rubs, or gallops Lungs:  *** CTA b/l, no wheezing, rhonchi or rales  Abd: soft, nontender MS: no deformity or *** atrophy Ext: *** no edema  Skin: warm and dry, no rash Neuro:  No gross deficits appreciated Psych: euthymic mood, full affect  *** PPM site: no skin changes, tethering, tenderness, or fluctuation   EKG:  Not done tdoay 07/24/2020: SR/V  paced   PPM interrogation done today and reviewed by myself: ***  ECHOCARDIOGRAM 2019: Study Conclusions  - Left ventricle: The cavity size was mildly dilated. Systolic  function was mildly to moderately reduced. The estimated ejection  fraction was in the range of 40% to 45%. Although no diagnostic  regional wall motion abnormality was identified, this possibility  cannot be completely excluded on the basis of this study.  Features are consistent with a pseudonormal left ventricular  filling pattern, with concomitant abnormal relaxation and  increased filling pressure (grade 2 diastolic dysfunction).  - Ventricular septum: Septal motion showed abnormal function and  dyssynergy. These changes are consistent with a left bundle  branch block.  - Aortic valve: Trileaflet; normal thickness leaflets.  Transvalvular velocity was within the normal range. There was no  stenosis. There was no regurgitation.  - Mitral valve: Transvalvular velocity was within the normal range.  There was no evidence for stenosis. There was no regurgitation.  - Right ventricle: The cavity size was moderately dilated. Wall  thickness was normal. Systolic function was mildly reduced.  - Atrial septum: No defect or patent foramen ovale was identified.  - Tricuspid valve: There was trivial regurgitation.  - Pulmonic valve: There was no significant regurgitation.   Impressions:  - Moderately reduced systolic LV function. Septal motion consistent  with dyssynchrony. No significant change from prior.    07/11/2018: ETT  Blood pressure demonstrated a normal response to exercise.  No T wave inversion was noted during stress.  There was no ST segment deviation noted during stress.   Study suggestive of chronotropic incompetence with poor HR augmentation with exercise. Frequent PVC's noted with underlying LBBB.  Blood pressure demonstrated a normal response to exercise.  No T  wave inversion was noted during stress.  There was no ST segment deviation noted during stress.   Submaximal stress test - non-diagnostic. Patient has resting LBBB which makes interpretation of ischemia very challenging and GXT is not typically recommended for these patients. Poor exercise tolerance. Numerous PVC's noted. Recommend lexiscan myoview for ischemia evaluation (CT coronary angiography would be challenging given the degree of ventricular ectopy).    09/14/17: stress myoview  Nuclear stress EF: 41%.  There was no ST segment deviation noted during stress.  The left ventricular ejection fraction is moderately decreased (30-44%).  Findings consistent with prior myocardial infarction.  This is an intermediate risk study.   1. EF 41%, inferior and septal hypokinesis.  2. Primarily fixed, large, severe basal inferolateral/inferior/inferoseptal, mid inferior/inferoseptal and apical inferior/septal perfusion defect.  Possible prior MI with no significant ischemia (cannot fully rule out artifact from LBBB).   Intermediate risk study.    01/27/15: TTE Study Conclusions - Left ventricle: The cavity size was normal. Wall thickness was normal. Systolic function was mildly to moderately reduced. The estimated ejection fraction was in the range of 40% to 45%. Although no diagnostic regional wall motion abnormality was identified, this possibility cannot be completely excluded on the basis of this study. Features are consistent with a pseudonormal left ventricular filling pattern, with concomitant abnormal relaxation and increased filling pressure (grade 2 diastolic dysfunction). - Ventricular septum: Septal motion showed abnormal function, dyssynergy, and paradox. These changes are consistent with a left bundle branch block. - Left atrium: The atrium was mildly dilated.   07/08/2008: c/MRI Final impression  1. Mild to Moderate LV cavity enlargement.  Quantitative ejection  fraction 45%.  2. No evidence of scar tissue. Findings most consistent with  nonischemic cardiomyopathy.  3. Mild left atrial enlargement.  4.. No pericardial effusion.  2002 cath noted  LM short and normal LAD no significant obstruction in LAD or it's branches (20% ostial lesion in RAO view) Cx mod sized gives off 2 marginals 1st OM 50-70% stenosis just distal to bifurcation 2nd OM eccentric 30% just beyond bifurcation from the cx RCA dominant with luminal irregularities     Recent Labs: 08/20/2020: ALT 23; BUN 22; Creat 1.13; Hemoglobin 15.0; Platelets 265; Potassium 5.5; Sodium 140  08/20/2020: Cholesterol 124; HDL 38; LDL Cholesterol (Calc) 62; Total CHOL/HDL Ratio 3.3; Triglycerides 166   CrCl cannot be calculated (Patient's most recent lab result is older than the maximum 21 days allowed.).   Wt Readings from Last 3 Encounters:  10/23/20 193 lb (87.5 kg)  09/23/20 189 lb (85.7 kg)  08/19/20 190 lb 6.4 oz (86.4 kg)     Other studies reviewed: Additional studies/records reviewed today include: summarized above  ASSESSMENT AND PLAN:  1. CRT-P     Intact function  A lead noise reversions have been seen before He has A and V lead noise Measurements are good Unable to reproduce today Some  Are clearly EMI others are larger brief intermittent noise and seen on BOTH leads (A lead signal is large though noted on both)  He has not had any V noise reversions Last was noted 8/10, timing correlates with time he was in the pool  I adjusted RV lead sensitivity to 20mV given device dependent  [employer]) rep came and evaluated EGMs agrees these are external, no evidence of lead malfunction.discussed external source may be his pool and has offered to go to his home to evaluate his device/EGMs when in the pool.  He has a remote scheduled for a couple weeks, he will hold off on home visit for now, have him see Dr. Lovena Le in a month or  so  Phrenic/diaphragmatic stim only when in a stooped and reaching type position, no LV lead programming changes were made.  Discussed avoiding this position   2. NICM     [place] looks OK, wobbles right at threshold     No symptoms or exam findings to suggest volume OL     On BB, no ACE/ARB will defer to Dr. Tamala Julian  3. CAD (mod OM branch disease by cath in 2002), known LBBB      On ASA, BB, and statin tx  Uncertain what his symtom while mowing the lawn was He has not had an device noted V noise or V reversions, device is functioning appropriately He does not have recurrent symptoms He wonders about ectopy at the time He V paces 99%, so PVC burden is low overall, no HVR episodes or AF Discussed possible angina, but this is a one time eveny to date He will monitor and reach out to Dr. Tamala Julian if any recurrent symptoms  4. HTN     Looks OK     No changes today         Disposition: ***      Current medicines are reviewed at length with the patient today.  The patient did not have any concerns regarding medicines.  Venetia Night, PA-C 11/26/2020 8:37 AM     [address] [place] [address] [phone] (office)  [phone] (fax)

## 2020-11-27 ENCOUNTER — Encounter: Payer: Medicare Other | Admitting: Physician Assistant

## 2020-12-01 ENCOUNTER — Other Ambulatory Visit: Payer: Self-pay

## 2020-12-01 ENCOUNTER — Encounter: Payer: Self-pay | Admitting: Physical Therapy

## 2020-12-01 ENCOUNTER — Ambulatory Visit: Payer: Medicare Other | Admitting: Physical Therapy

## 2020-12-01 DIAGNOSIS — M542 Cervicalgia: Secondary | ICD-10-CM

## 2020-12-01 NOTE — Therapy (Signed)
[facility] [address] [place], [place], [address] Phone: [phone]   Fax:  [phone]  Physical Therapy Evaluation  Patient Details  Name: Jeffery [name] MRN: [id number] Date of Birth: Aug 12, 1941 Referring Provider (PT): Charlann Boxer   Encounter Date: 11/26/2020   PT End of Session - 12/01/20 1049    Visit Number 1    Number of Visits 12    Date for PT Re-Evaluation 01/07/21    Authorization Type UHC    PT Start Time 0847    PT Stop Time 0928    PT Time Calculation (min) 41 min    Activity Tolerance Patient tolerated treatment well    Behavior During Therapy [facility] for tasks assessed/performed           Past Medical History:  Diagnosis Date  . Arthritis    "thumbs" (07/25/2018)  . Asthma   . CHF (congestive heart failure) ([place])   . Chronic bronchitis ([place])   . Environmental allergies   . GERD (gastroesophageal reflux disease)   . Headache    "seasonal; w/environmental allergies" (07/25/2018)  . History of blood transfusion    "when I had laminectomy" (07/25/2018)  . HTN (hypertension)   . Hyperlipidemia   . LBBB (left bundle branch block) 1999  . Metatarsal bone fracture 03/07/2018  . Myocardial infarction [facility])    "was told I've had an old MI; probably in the 54s" (07/25/2018)  . Pneumonia    "as a child, age 42; viral pneumonia 3 times in the last 10 years" (07/25/2018)  . Presence of permanent cardiac pacemaker 07/25/2018  . Seasonal allergies   . Sleep apnea    "wife says I do" (07/25/2018)  . Type II diabetes mellitus ([place])     Past Surgical History:  Procedure Laterality Date  . BACK SURGERY    . BIV PACEMAKER INSERTION CRT-P  07/25/2018  . BIV PACEMAKER INSERTION CRT-P N/A 07/25/2018   Procedure: BIV PACEMAKER INSERTION CRT-P;  Surgeon: Evans Lance, MD;  Location: [place] [facility];  Service: Cardiovascular;  Laterality: N/A;  . CARDIAC CATHETERIZATION  2003   Dr Pernell Dupre; 85 % R circumflex  obstruction  . CARPAL TUNNEL RELEASE Left 10/11/2014   Procedure: LEFT CARPAL TUNNEL RELEASE;  Surgeon: Roseanne Kaufman, MD;  Location: [place];  Service: Orthopedics;  Laterality: Left;  . COLONOSCOPY W/ BIOPSIES AND POLYPECTOMY  2018  . INGUINAL HERNIA REPAIR Right 1948  . INGUINAL HERNIA REPAIR Left 1988  . LUMBAR LAMINECTOMY  1984  . MINOR CARPAL TUNNEL Right 11/22/2014   Procedure: RIGHT LIMITED OPEN CARPAL TUNNEL RELEASE;  Surgeon: Roseanne Kaufman, MD;  Location: [place];  Service: Orthopedics;  Laterality: Right;  . TONSILLECTOMY  1958    There were no vitals filed for this visit.    Subjective Assessment - 12/01/20 1035    Subjective Pt states neck pain in R high cervical region. Pt states ongoing significant pain in neck, seeing [place] at [facility]. Surgery not recommended at this time. Will call to get recent notes and imaging. Pt has pain in R UT region, and R ramhorn headache. States significant crepitus with motion, and significant stiffness. Pt retired, but very active at home.    Pertinent History Pacemaker **    Patient Stated Goals decreased pain    Currently in Pain? Yes    Pain Score 7     Pain Location Neck    Pain Orientation Right  Pain Descriptors / Indicators Aching;Throbbing;Tightness    Pain Type Chronic pain    Pain Onset More than a month ago    Pain Frequency Intermittent    Aggravating Factors  any head movment    Pain Relieving Factors rest, getting neck " in right spot"              [facility] PT Assessment - 12/01/20 0001      Assessment   Medical Diagnosis Cervical pain    Referring Provider (PT) Charlann Boxer    Prior Therapy no      Precautions   Precautions ICD/Pacemaker      Balance Screen   Has the patient fallen in the past 6 months No      Prior Function   Level of Independence Independent      Cognition   Overall Cognitive Status Within Functional Limits for tasks assessed       ROM / Strength   AROM / PROM / Strength AROM;Strength      AROM   AROM Assessment Site Cervical    Cervical Flexion wfl    Cervical Extension 0    Cervical - Right Side Bend 0-5 deg    Cervical - Left Side Bend 0-5 deg    Cervical - Right Rotation 25    Cervical - Left Rotation 35      Strength   Overall Strength Comments Shoulder elevation and rotation sign limited on R>L, due to RTC pathology:  4-/5      Palpation   Palpation comment signfiicant tightness and hypertonicity in R UT, soreness into R SO, cervical paraspinals, pain into R side of head/ ram horn headache      Special Tests   Other special tests not tested                      Objective measurements completed on examination: See above findings.       [facility] Adult PT Treatment/Exercise - 12/01/20 0001      Exercises   Exercises Neck      Manual Therapy   Manual Therapy Soft tissue mobilization    Soft tissue mobilization Light STM to bil UT, levator, cervical paraspinals,                  PT Education - 12/01/20 1048    Education Details PT POC, Exam findings,    Person(s) Educated Patient    Methods Explanation;Demonstration;Verbal cues    Comprehension Verbalized understanding;Returned demonstration;Verbal cues required            PT Short Term Goals - 12/01/20 1053      PT SHORT TERM GOAL #1   Title Pt to verbalize safe pain relief techniques for muscle tightness and neck pain    Time 2    Period Weeks    Status New    Target Date 12/10/20             PT Long Term Goals - 12/01/20 1053      PT LONG TERM GOAL #1   Title Pt to be independent with safe HEP for neck pain    Time 6    Period Weeks    Status New    Target Date 01/07/21      PT LONG TERM GOAL #2   Title Pt to report decreasd pain in cervical region to 0-3/10 with activity    Time 6    Period Weeks  Status New    Target Date 01/07/21      PT LONG TERM GOAL #3   Title Pt to demo improvement of  soft tissue restrictions to be WNL in cervical and shoulder region, for decreased pain.    Time 6    Period Weeks    Status New    Target Date 01/07/21                  Plan - 12/01/20 1154    Clinical Impression Statement Pt presents with primary complaint of increased cervical pain. Pt with increased severity of deficits, due to location of dysfunction at high cervical region. Pt with severe stiffness of c-spine, with pain and crepitus with attempts for ROM. Pt with increased pain in posterior musculature and UT region, as well as associated headache on R side. Pt to benefit from skilled care to improve muscle tension and pain, and to instruct on safety and HEP as well.    Personal Factors and Comorbidities Comorbidity 1    Examination-Activity Limitations Lift;Locomotion Level;Reach Overhead;Caring for Others;Carry;Sleep;Dressing    Examination-Participation Restrictions Cleaning;Meal Prep;Yard Work;Community Activity;Driving;Shop    Stability/Clinical Decision Making Evolving/Moderate complexity    Clinical Decision Making Moderate    Rehab Potential Fair    PT Frequency 2x / week    PT Duration 6 weeks    PT Treatment/Interventions ADLs/Self Care Home Management;Cryotherapy;Electrical Stimulation;Iontophoresis 4mg /ml Dexamethasone;Moist Heat;Ultrasound;Therapeutic exercise;Therapeutic activities;DME Instruction;Neuromuscular re-education;Patient/family education;Manual techniques;Passive range of motion;Dry needling;Taping;Vasopneumatic Device    Consulted and Agree with Plan of Care Patient           Patient will benefit from skilled therapeutic intervention in order to improve the following deficits and impairments:  Pain,Increased muscle spasms,Decreased mobility,Decreased activity tolerance,Decreased range of motion,Decreased strength,Hypomobility,Impaired UE functional use,Impaired flexibility  Visit Diagnosis: Cervicalgia     Problem List Patient Active Problem  List   Diagnosis Date Noted  . Degenerative disc disease, cervical 09/23/2020  . Nonallopathic lesion of cervical region 09/23/2020  . Nonallopathic lesion of thoracic region 09/23/2020  . Pain medication agreement signed, 02/15/2020 02/15/2020  . Peroneal mononeuropathy, left 12/25/2018  . Diabetic polyneuropathy associated with type 2 diabetes mellitus ([place]) 12/25/2018  . Cylindrical bronchiectasis ([place]) 12/20/2018  . Viral pneumonia 12/12/2018  . Community acquired pneumonia 11/18/2018  . Folliculitis 41/28/7867  . Chronic systolic heart failure ([place]) 07/25/2018  . Encounter for chronic pain management 04/13/2017  . Osteoarthritis 04/12/2017  . Chronic combined systolic and diastolic CHF (congestive heart failure) ([place]) 01/24/2015  . Left bundle branch block 11/27/2013  . Essential hypertension 11/27/2013  . GERD (gastroesophageal reflux disease) 02/27/2013  . Type 2 diabetes mellitus with vascular disease ([place]) 04/09/2009  . HLD (hyperlipidemia) 09/06/2008  . Coronary atherosclerosis 09/06/2008  . Asthma 09/06/2008   Lyndee Hensen, PT, DPT 12:12 PM  12/01/20    [place], [place], [address] Phone: [phone]   Fax:  [phone]  Name: JOHNELL BAS MRN: [id number] Date of Birth: 10/10/1941

## 2020-12-01 NOTE — Therapy (Signed)
[facility] [address] [place], [place], [address] Phone: [phone]   Fax:  [phone]  Physical Therapy Treatment  Patient Details  Name: Jeffery [name] MRN: [id number] Date of Birth: 01/21/1941 Referring Provider (PT): Charlann Boxer   Encounter Date: 12/01/2020   PT End of Session - 12/01/20 1536    Visit Number 2    Number of Visits 12    Date for PT Re-Evaluation 01/07/21    Authorization Type UHC    PT Start Time 0850    PT Stop Time 0933    PT Time Calculation (min) 43 min    Activity Tolerance Patient tolerated treatment well    Behavior During Therapy [facility] for tasks assessed/performed           Past Medical History:  Diagnosis Date   Arthritis    "thumbs" (07/25/2018)   Asthma    CHF (congestive heart failure) ([place])    Chronic bronchitis ([place])    Environmental allergies    GERD (gastroesophageal reflux disease)    Headache    "seasonal; w/environmental allergies" (07/25/2018)   History of blood transfusion    "when I had laminectomy" (07/25/2018)   HTN (hypertension)    Hyperlipidemia    LBBB (left bundle branch block) 1999   Metatarsal bone fracture 03/07/2018   Myocardial infarction [facility])    "was told I've had an old MI; probably in the 1990s" (07/25/2018)   Pneumonia    "as a child, age 80; viral pneumonia 3 times in the last 10 years" (07/25/2018)   Presence of permanent cardiac pacemaker 07/25/2018   Seasonal allergies    Sleep apnea    "wife says I do" (07/25/2018)   Type II diabetes mellitus ([place])     Past Surgical History:  Procedure Laterality Date   BACK SURGERY     BIV PACEMAKER INSERTION CRT-P  07/25/2018   BIV PACEMAKER INSERTION CRT-P N/A 07/25/2018   Procedure: BIV PACEMAKER INSERTION CRT-P;  Surgeon: Evans Lance, MD;  Location: [place] [facility];  Service: Cardiovascular;  Laterality: N/A;   CARDIAC CATHETERIZATION  2003   Dr Pernell Dupre; 85 % R circumflex  obstruction   CARPAL TUNNEL RELEASE Left 10/11/2014   Procedure: LEFT CARPAL TUNNEL RELEASE;  Surgeon: Roseanne Kaufman, MD;  Location: [place];  Service: Orthopedics;  Laterality: Left;   COLONOSCOPY W/ BIOPSIES AND POLYPECTOMY  2018   INGUINAL HERNIA REPAIR Right 1948   INGUINAL HERNIA REPAIR Left 1988   LUMBAR LAMINECTOMY  1984   MINOR CARPAL TUNNEL Right 11/22/2014   Procedure: RIGHT LIMITED OPEN CARPAL TUNNEL RELEASE;  Surgeon: Roseanne Kaufman, MD;  Location: [place];  Service: Orthopedics;  Laterality: Right;   TONSILLECTOMY  1958    There were no vitals filed for this visit.   Subjective Assessment - 12/01/20 1535    Subjective Pt stats ongoing pain    Currently in Pain? Yes    Pain Score 7     Pain Location Neck    Pain Orientation Right;Left    Pain Descriptors / Indicators Aching;Throbbing    Pain Type Chronic pain    Pain Onset More than a month ago    Pain Frequency Intermittent                             [facility] Adult PT Treatment/Exercise - 12/01/20 1532      Neck Exercises: Standing  Other Standing Exercises Rows, light, GTB x 15; Bil ER YTB for postural strengthening ,      Neck Exercises: Seated   Other Seated Exercise Shoulder pulley flexion x 2 min for ROM and education on decreasing UT compensation;      Manual Therapy   Manual Therapy Taping    Soft tissue mobilization supine: Light STM to bil UT, levator, cervical  paraspinals, Seated: IATM to R UT    Kinesiotex Inhibit Muscle      Kinesiotix   Inhibit Muscle  1 I strip bil, for UT inhibition                    PT Short Term Goals - 12/01/20 1053      PT SHORT TERM GOAL #1   Title Pt to verbalize safe pain relief techniques for muscle tightness and neck pain    Time 2    Period Weeks    Status New    Target Date 12/10/20             PT Long Term Goals - 12/01/20 1053      PT LONG TERM GOAL #1   Title Pt to be  independent with safe HEP for neck pain    Time 6    Period Weeks    Status New    Target Date 01/07/21      PT LONG TERM GOAL #2   Title Pt to report decreasd pain in cervical region to 0-3/10 with activity    Time 6    Period Weeks    Status New    Target Date 01/07/21      PT LONG TERM GOAL #3   Title Pt to demo improvement of soft tissue restrictions to be WNL in cervical and shoulder region, for decreased pain.    Time 6    Period Weeks    Status New    Target Date 01/07/21                 Plan - 12/01/20 1536    Clinical Impression Statement Jeffery [name] STM done for muscle tightness and soreness. Discussed light postural strengthening. Taping done for inhibition of UTs and pain relief, will assess effects next visit    Personal Factors and Comorbidities Comorbidity 1    Examination-Activity Limitations Lift;Locomotion Level;Reach Overhead;Caring for Others;Carry;Sleep;Dressing    Examination-Participation Restrictions Cleaning;Meal Prep;Yard Work;Community Activity;Driving;Shop    Stability/Clinical Decision Making Evolving/Moderate complexity    Rehab Potential Fair    PT Frequency 2x / week    PT Duration 6 weeks    PT Treatment/Interventions ADLs/Self Care Home Management;Cryotherapy;Electrical Stimulation;Iontophoresis 4mg /ml Dexamethasone;Moist Heat;Ultrasound;Therapeutic exercise;Therapeutic activities;DME Instruction;Neuromuscular re-education;Patient/family education;Manual techniques;Passive range of motion;Dry needling;Taping;Vasopneumatic Device    Consulted and Agree with Plan of Care Patient           Patient will benefit from skilled therapeutic intervention in order to improve the following deficits and impairments:  Pain,Increased muscle spasms,Decreased mobility,Decreased activity tolerance,Decreased range of motion,Decreased strength,Hypomobility,Impaired UE functional use,Impaired flexibility  Visit Diagnosis: Cervicalgia     Problem  List Patient Active Problem List   Diagnosis Date Noted   Degenerative disc disease, cervical 09/23/2020   Nonallopathic lesion of cervical region 09/23/2020   Nonallopathic lesion of thoracic region 09/23/2020   Pain medication agreement signed, 02/15/2020 02/15/2020   Peroneal mononeuropathy, left 12/25/2018   Diabetic polyneuropathy associated with type 2 diabetes mellitus ([place]) 12/25/2018   Cylindrical bronchiectasis ([place]) 12/20/2018   Viral pneumonia 12/12/2018  Community acquired pneumonia 68/54/8830   Folliculitis 14/15/9733   Chronic systolic heart failure ([place]) 07/25/2018   Encounter for chronic pain management 04/13/2017   Osteoarthritis 04/12/2017   Chronic combined systolic and diastolic CHF (congestive heart failure) ([place]) 01/24/2015   Left bundle branch block 11/27/2013   Essential hypertension 11/27/2013   GERD (gastroesophageal reflux disease) 02/27/2013   Type 2 diabetes mellitus with vascular disease ([place]) 04/09/2009   HLD (hyperlipidemia) 09/06/2008   Coronary atherosclerosis 09/06/2008   Asthma 09/06/2008    Lyndee Hensen, PT, DPT 3:38 PM  12/01/20    [place], [place], [address] Phone: [phone]   Fax:  [phone]  Name: Jeffery [name] MRN: [id number] Date of Birth: 17-Oct-1941

## 2020-12-04 ENCOUNTER — Other Ambulatory Visit: Payer: Self-pay | Admitting: Interventional Cardiology

## 2020-12-09 ENCOUNTER — Other Ambulatory Visit: Payer: Self-pay

## 2020-12-09 ENCOUNTER — Encounter: Payer: Self-pay | Admitting: Physical Therapy

## 2020-12-09 ENCOUNTER — Ambulatory Visit: Payer: Medicare Other | Admitting: Physical Therapy

## 2020-12-09 ENCOUNTER — Other Ambulatory Visit: Payer: Self-pay | Admitting: Internal Medicine

## 2020-12-09 DIAGNOSIS — M542 Cervicalgia: Secondary | ICD-10-CM

## 2020-12-09 NOTE — Therapy (Signed)
[facility] [address] [place], [place], [address] Phone: [phone]   Fax:  [phone]  Physical Therapy Treatment  Patient Details  Name: Jeffery [name] MRN: [id number] Date of Birth: 25-Aug-1941 Referring Provider (PT): Charlann Boxer   Encounter Date: 12/09/2020   PT End of Session - 12/09/20 1025    Visit Number 3    Number of Visits 12    Date for PT Re-Evaluation 01/07/21    Authorization Type UHC    PT Start Time 0805    PT Stop Time 0855    PT Time Calculation (min) 50 min    Activity Tolerance Patient tolerated treatment well    Behavior During Therapy [facility] for tasks assessed/performed           Past Medical History:  Diagnosis Date   Arthritis    "thumbs" (07/25/2018)   Asthma    CHF (congestive heart failure) ([facility])    Chronic bronchitis ([place])    Environmental allergies    GERD (gastroesophageal reflux disease)    Headache    "seasonal; w/environmental allergies" (07/25/2018)   History of blood transfusion    "when I had laminectomy" (07/25/2018)   HTN (hypertension)    Hyperlipidemia    LBBB (left bundle branch block) 1999   Metatarsal bone fracture 03/07/2018   Myocardial infarction [facility])    "was told I've had an old MI; probably in the 1990s" (07/25/2018)   Pneumonia    "as a child, age 32; viral pneumonia 3 times in the last 10 years" (07/25/2018)   Presence of permanent cardiac pacemaker 07/25/2018   Seasonal allergies    Sleep apnea    "wife says I do" (07/25/2018)   Type II diabetes mellitus ([place])     Past Surgical History:  Procedure Laterality Date   BACK SURGERY     BIV PACEMAKER INSERTION CRT-P  07/25/2018   BIV PACEMAKER INSERTION CRT-P N/A 07/25/2018   Procedure: BIV PACEMAKER INSERTION CRT-P;  Surgeon: Evans Lance, MD;  Location: [place] [facility];  Service: Cardiovascular;  Laterality: N/A;   CARDIAC CATHETERIZATION  2003   Dr Pernell Dupre; 85 % R circumflex  obstruction   CARPAL TUNNEL RELEASE Left 10/11/2014   Procedure: LEFT CARPAL TUNNEL RELEASE;  Surgeon: Roseanne Kaufman, MD;  Location: [facility];  Service: Orthopedics;  Laterality: Left;   COLONOSCOPY W/ BIOPSIES AND POLYPECTOMY  2018   INGUINAL HERNIA REPAIR Right 1948   INGUINAL HERNIA REPAIR Left 1988   LUMBAR LAMINECTOMY  1984   MINOR CARPAL TUNNEL Right 11/22/2014   Procedure: RIGHT LIMITED OPEN CARPAL TUNNEL RELEASE;  Surgeon: Roseanne Kaufman, MD;  Location: [place];  Service: Orthopedics;  Laterality: Right;   TONSILLECTOMY  1958    There were no vitals filed for this visit.   Subjective Assessment - 12/09/20 1024    Subjective Pt states ongoing pain. Did get relief from last session soft tissue work, but carryover limited    Currently in Pain? Yes    Pain Score 7     Pain Location Neck    Pain Orientation Right;Left    Pain Descriptors / Indicators Aching;Throbbing    Pain Type Chronic pain    Pain Onset More than a month ago    Pain Frequency Intermittent                             [facility] Adult PT Treatment/Exercise -  12/09/20 0001      Neck Exercises: Standing   Other Standing Exercises Rows, GTB x 20; Bil ER YTB for postural strengthening ,    Other Standing Exercises UE flexion AROM/ scaption x 15 for strengthening and education on decreasing muscle compensation.      Neck Exercises: Seated   Other Seated Exercise Shoulder pulley flexion x 2 min for ROM and education on decreasing UT compensation;      Manual Therapy   Manual Therapy Taping    Manual therapy comments skilled palpation and monitoring of soft tissue with dry needling.    Soft tissue mobilization supine: DTM to bil UT, levator, STM to cervical  paraspinals and SO.    Kinesiotex --      Kinesiotix   Inhibit Muscle  --            Trigger Point Dry Needling - 12/09/20 0001    Consent Given? Yes    Education Handout Provided Yes     Muscles Treated Head and Neck Upper trapezius;Levator scapulae;Splenius capitus    Upper Trapezius Response Twitch reponse elicited;Palpable increased muscle length   R   Levator Scapulae Response Twitch response elicited;Palpable increased muscle length   R   Splenius capitus Response Palpable increased muscle length   R c3-5                 PT Short Term Goals - 12/01/20 1053      PT SHORT TERM GOAL #1   Title Pt to verbalize safe pain relief techniques for muscle tightness and neck pain    Time 2    Period Weeks    Status New    Target Date 12/10/20             PT Long Term Goals - 12/01/20 1053      PT LONG TERM GOAL #1   Title Pt to be independent with safe HEP for neck pain    Time 6    Period Weeks    Status New    Target Date 01/07/21      PT LONG TERM GOAL #2   Title Pt to report decreasd pain in cervical region to 0-3/10 with activity    Time 6    Period Weeks    Status New    Target Date 01/07/21      PT LONG TERM GOAL #3   Title Pt to demo improvement of soft tissue restrictions to be WNL in cervical and shoulder region, for decreased pain.    Time 6    Period Weeks    Status New    Target Date 01/07/21                 Plan - 12/09/20 1026    Clinical Impression Statement Manual soft tissue work as well as dry needling done today for improving muscle tension and pain. Continued discussion on posture for decreasing muscle tension posteriorly. Pt with Most soreness in R high cervical region, and significnt tightness in R UT region.    Personal Factors and Comorbidities Comorbidity 1    Examination-Activity Limitations Lift;Locomotion Level;Reach Overhead;Caring for Others;Carry;Sleep;Dressing    Examination-Participation Restrictions Cleaning;Meal Prep;Yard Work;Community Activity;Driving;Shop    Stability/Clinical Decision Making Evolving/Moderate complexity    Rehab Potential Fair    PT Frequency 2x / week    PT Duration 6 weeks    PT  Treatment/Interventions ADLs/Self Care Home Management;Cryotherapy;Electrical Stimulation;Iontophoresis 4mg /ml Dexamethasone;Moist Heat;Ultrasound;Therapeutic exercise;Therapeutic activities;DME Instruction;Neuromuscular re-education;Patient/family education;Manual techniques;Passive  range of motion;Dry needling;Taping;Vasopneumatic Device    Consulted and Agree with Plan of Care Patient           Patient will benefit from skilled therapeutic intervention in order to improve the following deficits and impairments:  Pain,Increased muscle spasms,Decreased mobility,Decreased activity tolerance,Decreased range of motion,Decreased strength,Hypomobility,Impaired UE functional use,Impaired flexibility  Visit Diagnosis: Cervicalgia     Problem List Patient Active Problem List   Diagnosis Date Noted   Degenerative disc disease, cervical 09/23/2020   Nonallopathic lesion of cervical region 09/23/2020   Nonallopathic lesion of thoracic region 09/23/2020   Pain medication agreement signed, 02/15/2020 02/15/2020   Peroneal mononeuropathy, left 12/25/2018   Diabetic polyneuropathy associated with type 2 diabetes mellitus ([facility]) 12/25/2018   Cylindrical bronchiectasis ([facility]) 12/20/2018   Viral pneumonia 12/12/2018   Community acquired pneumonia 11/18/2018   Folliculitis 08/31/2018   Chronic systolic heart failure ([facility]) 07/25/2018   Encounter for chronic pain management 04/13/2017   Osteoarthritis 04/12/2017   Chronic combined systolic and diastolic CHF (congestive heart failure) ([facility]) 01/24/2015   Left bundle branch block 11/27/2013   Essential hypertension 11/27/2013   GERD (gastroesophageal reflux disease) 02/27/2013   Type 2 diabetes mellitus with vascular disease ([facility]) 04/09/2009   HLD (hyperlipidemia) 09/06/2008   Coronary atherosclerosis 09/06/2008   Asthma 09/06/2008    Sedalia Muta, PT, DPT 10:31 AM  12/09/20    [facility]  [address] [place], [place], [address] Phone: [phone]   Fax:  [phone]  Name: Jeffery [name] MRN: [id number] Date of Birth: 04-Feb-1941

## 2020-12-11 ENCOUNTER — Ambulatory Visit: Payer: Medicare Other | Admitting: Physical Therapy

## 2020-12-11 ENCOUNTER — Other Ambulatory Visit: Payer: Self-pay

## 2020-12-11 ENCOUNTER — Encounter: Payer: Self-pay | Admitting: Physical Therapy

## 2020-12-11 DIAGNOSIS — M542 Cervicalgia: Secondary | ICD-10-CM | POA: Diagnosis not present

## 2020-12-13 ENCOUNTER — Other Ambulatory Visit: Payer: Self-pay | Admitting: Family Medicine

## 2020-12-14 ENCOUNTER — Encounter: Payer: Self-pay | Admitting: Physical Therapy

## 2020-12-14 NOTE — Therapy (Signed)
[facility] [address] [place], [place], [address] Phone: [phone]   Fax:  [phone]  Physical Therapy Treatment  Patient Details  Name: Jeffery [name] MRN: [id number] Date of Birth: [birth year] Referring Provider (PT): Terrilee Files   Encounter Date: 12/11/2020   PT End of Session - 12/14/20 1850    Visit Number 4    Number of Visits 12    Date for PT Re-Evaluation 01/07/21    Authorization Type UHC    PT Start Time 0800    PT Stop Time 0843    PT Time Calculation (min) 43 min    Activity Tolerance Patient tolerated treatment well    Behavior During Therapy [facility] for tasks assessed/performed           Past Medical History:  Diagnosis Date  . Arthritis    "thumbs" (07/25/2018)  . Asthma   . CHF (congestive heart failure) (HCC)   . Chronic bronchitis (HCC)   . Environmental allergies   . GERD (gastroesophageal reflux disease)   . Headache    "seasonal; w/environmental allergies" (07/25/2018)  . History of blood transfusion    "when I had laminectomy" (07/25/2018)  . HTN (hypertension)   . Hyperlipidemia   . LBBB (left bundle branch block) 1999  . Metatarsal bone fracture 03/07/2018  . Myocardial infarction [facility])    "was told I've had an old MI; probably in the 12s" (07/25/2018)  . Pneumonia    "as a child, age 58; viral pneumonia 3 times in the last 10 years" (07/25/2018)  . Presence of permanent cardiac pacemaker 07/25/2018  . Seasonal allergies   . Sleep apnea    "wife says I do" (07/25/2018)  . Type II diabetes mellitus (HCC)     Past Surgical History:  Procedure Laterality Date  . BACK SURGERY    . BIV PACEMAKER INSERTION CRT-P  07/25/2018  . BIV PACEMAKER INSERTION CRT-P N/A 07/25/2018   Procedure: BIV PACEMAKER INSERTION CRT-P;  Surgeon: Marinus Maw, MD;  Location: [facility] INVASIVE CV LAB;  Service: Cardiovascular;  Laterality: N/A;  . CARDIAC CATHETERIZATION  2003   Dr Garnette Scheuermann; 85 % R circumflex  obstruction  . CARPAL TUNNEL RELEASE Left 10/11/2014   Procedure: LEFT CARPAL TUNNEL RELEASE;  Surgeon: Dominica Severin, MD;  Location: [facility];  Service: Orthopedics;  Laterality: Left;  . COLONOSCOPY W/ BIOPSIES AND POLYPECTOMY  2018  . INGUINAL HERNIA REPAIR Right 1948  . INGUINAL HERNIA REPAIR Left 1988  . LUMBAR LAMINECTOMY  1984  . MINOR CARPAL TUNNEL Right 11/22/2014   Procedure: RIGHT LIMITED OPEN CARPAL TUNNEL RELEASE;  Surgeon: Dominica Severin, MD;  Location: [facility];  Service: Orthopedics;  Laterality: Right;  . TONSILLECTOMY  1958    There were no vitals filed for this visit.   Subjective Assessment - 12/14/20 1847    Subjective Pt states pain in R upper cervical into R posterior head. Did a lot of activit at home yesterday.    Currently in Pain? Yes    Pain Location Neck    Pain Orientation Right;Left    Pain Descriptors / Indicators Aching;Throbbing    Pain Type Chronic pain    Pain Onset More than a month ago    Pain Frequency Intermittent                             [facility] Adult PT Treatment/Exercise - 12/14/20 0001  Neck Exercises: Standing   Other Standing Exercises Rows, GTB x 20; Bil ER YTB for postural strengthening ,    Other Standing Exercises UE flexion AROM/ scaption x 15 for strengthening and education on decreasing muscle compensation.      Neck Exercises: Seated   Other Seated Exercise Shoulder pulley flexion x 2 min for ROM and education on decreasing UT compensation;      Manual Therapy   Manual Therapy Taping    Manual therapy comments skilled palpation and monitoring of soft tissue with dry needling.    Soft tissue mobilization supine: DTM to bil UT, levator, STM to cervical  paraspinals and SO. Seated: IASTM to R UT            Trigger Point Dry Needling - 12/14/20 0001    Consent Given? Yes    Education Handout Provided Previously provided    Muscles Treated Head and Neck Upper  trapezius    Upper Trapezius Response Twitch reponse elicited;Palpable increased muscle length   R                 PT Short Term Goals - 12/01/20 1053      PT SHORT TERM GOAL #1   Title Pt to verbalize safe pain relief techniques for muscle tightness and neck pain    Time 2    Period Weeks    Status New    Target Date 12/10/20             PT Long Term Goals - 12/01/20 1053      PT LONG TERM GOAL #1   Title Pt to be independent with safe HEP for neck pain    Time 6    Period Weeks    Status New    Target Date 01/07/21      PT LONG TERM GOAL #2   Title Pt to report decreasd pain in cervical region to 0-3/10 with activity    Time 6    Period Weeks    Status New    Target Date 01/07/21      PT LONG TERM GOAL #3   Title Pt to demo improvement of soft tissue restrictions to be WNL in cervical and shoulder region, for decreased pain.    Time 6    Period Weeks    Status New    Target Date 01/07/21                 Plan - 12/14/20 1853    Clinical Impression Statement Pt with tightness in R UT region, addressed with DN and manal today. Pt continues to have increased pain in R high cervical region with activity. Other modalities not dones for pain due to pace maker placement.    Personal Factors and Comorbidities Comorbidity 1    Examination-Activity Limitations Lift;Locomotion Level;Reach Overhead;Caring for Others;Carry;Sleep;Dressing    Examination-Participation Restrictions Cleaning;Meal Prep;Yard Work;Community Activity;Driving;Shop    Stability/Clinical Decision Making Evolving/Moderate complexity    Rehab Potential Fair    PT Frequency 2x / week    PT Duration 6 weeks    PT Treatment/Interventions ADLs/Self Care Home Management;Cryotherapy;Electrical Stimulation;Iontophoresis 4mg /ml Dexamethasone;Moist Heat;Ultrasound;Therapeutic exercise;Therapeutic activities;DME Instruction;Neuromuscular re-education;Patient/family education;Manual techniques;Passive  range of motion;Dry needling;Taping;Vasopneumatic Device    Consulted and Agree with Plan of Care Patient           Patient will benefit from skilled therapeutic intervention in order to improve the following deficits and impairments:  Pain,Increased muscle spasms,Decreased mobility,Decreased activity tolerance,Decreased range of motion,Decreased  strength,Hypomobility,Impaired UE functional use,Impaired flexibility  Visit Diagnosis: Cervicalgia     Problem List Patient Active Problem List   Diagnosis Date Noted  . Degenerative disc disease, cervical 09/23/2020  . Nonallopathic lesion of cervical region 09/23/2020  . Nonallopathic lesion of thoracic region 09/23/2020  . Pain medication agreement signed, 02/15/2020 02/15/2020  . Peroneal mononeuropathy, left 12/25/2018  . Diabetic polyneuropathy associated with type 2 diabetes mellitus ([place]) 12/25/2018  . Cylindrical bronchiectasis ([place]) 12/20/2018  . Viral pneumonia 12/12/2018  . Community acquired pneumonia 11/18/2018  . Folliculitis [id number]  . Chronic systolic heart failure ([place]) 07/25/2018  . Encounter for chronic pain management 04/13/2017  . Osteoarthritis 04/12/2017  . Chronic combined systolic and diastolic CHF (congestive heart failure) ([place]) 01/24/2015  . Left bundle branch block 11/27/2013  . Essential hypertension 11/27/2013  . GERD (gastroesophageal reflux disease) 02/27/2013  . Type 2 diabetes mellitus with vascular disease ([place]) 04/09/2009  . HLD (hyperlipidemia) 09/06/2008  . Coronary atherosclerosis 09/06/2008  . Asthma 09/06/2008   Lyndee Hensen, PT, DPT 6:57 PM  12/14/20    [place], [place], [address] Phone: [phone]   Fax:  [phone]  Name: Jeffery [name] MRN: [id number] Date of Birth: 08-19-1941

## 2020-12-15 ENCOUNTER — Encounter: Payer: Medicare Other | Admitting: Physical Therapy

## 2020-12-15 NOTE — Telephone Encounter (Signed)
Refill done.  

## 2020-12-17 ENCOUNTER — Other Ambulatory Visit: Payer: Self-pay | Admitting: Internal Medicine

## 2020-12-17 DIAGNOSIS — L853 Xerosis cutis: Secondary | ICD-10-CM | POA: Diagnosis not present

## 2020-12-17 DIAGNOSIS — L814 Other melanin hyperpigmentation: Secondary | ICD-10-CM | POA: Diagnosis not present

## 2020-12-17 DIAGNOSIS — D229 Melanocytic nevi, unspecified: Secondary | ICD-10-CM | POA: Diagnosis not present

## 2020-12-17 DIAGNOSIS — L821 Other seborrheic keratosis: Secondary | ICD-10-CM | POA: Diagnosis not present

## 2020-12-17 DIAGNOSIS — L819 Disorder of pigmentation, unspecified: Secondary | ICD-10-CM | POA: Diagnosis not present

## 2020-12-17 DIAGNOSIS — L812 Freckles: Secondary | ICD-10-CM | POA: Diagnosis not present

## 2020-12-17 NOTE — Telephone Encounter (Signed)
    Patient calling for status of refill, please see 12/28 message

## 2020-12-18 MED ORDER — HYDROCODONE-ACETAMINOPHEN 7.5-325 MG PO TABS: 1.0000 | ORAL_TABLET | Freq: Four times a day (QID) | ORAL | 0 refills | Status: DC | PRN

## 2020-12-23 ENCOUNTER — Encounter: Payer: Medicare Other | Admitting: Physical Therapy

## 2020-12-23 NOTE — Progress Notes (Deleted)
Cardiology Office Note Date:  12/23/2020  Patient ID:  Jeffery [name], DOB 1941-04-14, MRN [id number] PCP:  [name], MD  Cardiologist:  Dr. Tamala Julian EP: Dr. Lovena Le    Chief Complaint:  *** ?? pacer check  History of Present Illness: Jeffery [name] is a 80 y.o. 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  He comes in today to be seen for Dr. Lovena Le, last seen by him Aug 2020.  t that time doing well, class I symptoms, device function reported as functioning normally, no changes were made.  Most recently he saw Dr. Tamala Julian May 2021, he was feeling well, mentioned diaphragmatic stim when squatting and bending forward.  The patient called the office noting that when doing some activities in the yard (mowing grass etc) using gas powered machines he had been feeling unusually labored, easily winded and had noted that his HR was dropping to high 40's- low 50's.  His normal noted exercise HR typically 100-110.  When he started to use the lawnmower again, he again felt the same. device transmission noted  A lead noise, RN also mentioned some intermittent V noise. Timing could not be clearly verified with lawn mower , there was some mention of a home massager use.  I saw him 07/24/2020 He discusses as noted above.  This is the only time he has noted this.  He had no CP, did not feel dizzy or lightheaded.  Was push mowing and is up/down a hill and labor intensive.  He has not had difficulty or similar symptoms before with this activity.  He has not mowed the lawn yet again. He denies any symptoms when in the pool or exerting himself otherwise, feels like he has very good exertional capacity He thinks he may have been having ectopy, PVCs He denies any dizzy spells, no near syncope or syncope  Device check  noted  He has had A lead noise reversions He has had V lead noise as well though not seen by the device Measurements are good Unable to reproduce today Some  Are clearly EMI others are larger brief intermittent noise and seen on BOTH leads (A lead signal is large though noted on both) He has not had any V noise reversions Last was noted 8/10, timing correlates with time he was in the pool I adjusted RV lead sensitivity to 8mV given device dependent [employer]) rep came and evaluated EGMs agrees these are external, no evidence of lead malfunction.discussed external source may be his pool and has offered to go to his home to evaluate his device/EGMs when in the pool. Phrenic/diaphragmatic stim only when in a stooped and reaching type position, no LV lead programming changes were made.  Discussed avoiding this position Uncertain what his symtom while mowing the lawn was He has not had an device noted V noise or V reversions, device is functioning appropriately He does not have recurrent symptoms He wonders about ectopy at the time He V paces 99%, so PVC burden is low overall, no HVR episodes or AF Discussed possible angina, but this is a one time eveny to date He will monitor and reach out to Dr. Tamala Julian if any recurrent symptoms Planned to see Dr. Lovena Le in f/u though does not look like he did  *** pool checked? *** noise? *** recurrent symptoms?  Device information [employer] CRT-P implanted 07/25/2018   Past Medical History:  Diagnosis Date  . Arthritis    "thumbs" (07/25/2018)  . Asthma   . CHF (congestive heart failure) ([place])   . Chronic bronchitis ([place])   . Environmental allergies   . GERD (gastroesophageal reflux disease)   . Headache    "seasonal; w/environmental allergies" (07/25/2018)  . History of blood transfusion    "when I had laminectomy" (07/25/2018)  . HTN (hypertension)   . Hyperlipidemia   . LBBB (left bundle branch block) 1999  . Metatarsal bone fracture 03/07/2018   . Myocardial infarction [facility])    "was told I've had an old MI; probably in the 48s" (07/25/2018)  . Pneumonia    "as a child, age 28; viral pneumonia 3 times in the last 10 years" (07/25/2018)  . Presence of permanent cardiac pacemaker 07/25/2018  . Seasonal allergies   . Sleep apnea    "wife says I do" (07/25/2018)  . Type II diabetes mellitus ([place])     Past Surgical History:  Procedure Laterality Date  . BACK SURGERY    . BIV PACEMAKER INSERTION CRT-P  07/25/2018  . BIV PACEMAKER INSERTION CRT-P N/A 07/25/2018   Procedure: BIV PACEMAKER INSERTION CRT-P;  Surgeon: Evans Lance, MD;  Location: [facility];  Service: Cardiovascular;  Laterality: N/A;  . CARDIAC CATHETERIZATION  2003   Dr Pernell Dupre; 85 % R circumflex obstruction  . CARPAL TUNNEL RELEASE Left 10/11/2014   Procedure: LEFT CARPAL TUNNEL RELEASE;  Surgeon: Roseanne Kaufman, MD;  Location: [place];  Service: Orthopedics;  Laterality: Left;  . COLONOSCOPY W/ BIOPSIES AND POLYPECTOMY  2018  . INGUINAL HERNIA REPAIR Right 1948  . INGUINAL HERNIA REPAIR Left 1988  . LUMBAR LAMINECTOMY  1984  . MINOR CARPAL TUNNEL Right 11/22/2014   Procedure: RIGHT LIMITED OPEN CARPAL TUNNEL RELEASE;  Surgeon: Roseanne Kaufman, MD;  Location: [place];  Service: Orthopedics;  Laterality: Right;  . TONSILLECTOMY  1958    Current Outpatient Medications  Medication Sig Dispense Refill  . HYDROcodone-acetaminophen (NORCO) 7.5-325 MG tablet Take 1 tablet by mouth every 6 (six) hours as needed (for chronic joint pain). 60 tablet 0  . ACCU-CHEK AVIVA PLUS test strip USE AS INSTRUCTED TO TEST BLOOD SUGAR 6 TIMES DAILY. DX CODE: E11.59 500 strip 4  . ACCU-CHEK SOFTCLIX LANCETS lancets Use to test blood sugar daily as directed. 100 each 3  . albuterol (PROAIR HFA) 108 (90 BASE) MCG/ACT inhaler Inhale 2 puffs into the lungs every 6 (six) hours as needed for wheezing or shortness of breath.     [name] albuterol  (PROVENTIL) (2.5 MG/3ML) 0.083% nebulizer solution TAKE 3 MLS BY NEBULIZATION EVERY 6 (SIX) HOURS AS NEEDED FOR WHEEZING OR SHORTNESS OF BREATH. 150 mL 5  . aspirin 81 MG tablet Take 81 mg by mouth at bedtime.      [name] BYSTOLIC 5 MG tablet TAKE 1 TABLET BY MOUTH  DAILY 90 tablet 2  . celecoxib (CELEBREX) 100 MG capsule TAKE 1 CAPSULE BY MOUTH EVERY DAY 90 capsule 0  . Continuous Blood Gluc Receiver (FREESTYLE LIBRE 14 DAY READER) DEVI 1 Device by Does not apply route every 14 (fourteen) days. 1 Device 0  . Continuous Blood Gluc Sensor (FREESTYLE LIBRE 14 DAY SENSOR) MISC APPLY 1 SENSOR EVERY 14 DAYS 2 each 2  . famotidine (PEPCID) 20 MG tablet Take 1 tablet (20 mg total) by mouth daily. 30 tablet 0  .  fexofenadine (ALLEGRA) 180 MG tablet Take 180 mg by mouth daily as needed for allergies.     [name] gabapentin (NEURONTIN) 100 MG capsule Take 2 capsules (200 mg total) by mouth at bedtime. 180 capsule 3  . ipratropium (ATROVENT) 0.02 % nebulizer solution Take 2.5 mLs (0.5 mg total) by nebulization every 6 (six) hours. 75 mL 3  . levalbuterol (XOPENEX) 1.25 MG/3ML nebulizer solution Take 1.25 mg by nebulization every 6 (six) hours. 120 mL 3  . LORazepam (ATIVAN) 1 MG tablet TAKE 1/2 TO 1 TABLET BY MOUTH DAILY AS NEEDED. 30 tablet 5  . metFORMIN (GLUCOPHAGE) 500 MG tablet TAKE 1 TABLET BY MOUTH  DAILY WITH BREAKFAST 90 tablet 3  . montelukast (SINGULAIR) 10 MG tablet TAKE 1 TABLET BY MOUTH AT  BEDTIME 90 tablet 3  . Multiple Vitamin (MULTIVITAMIN WITH MINERALS) TABS tablet Take 1 tablet by mouth daily.    . nitroGLYCERIN (NITROSTAT) 0.4 MG SL tablet PLACE 1 TABLET (0.4 MG TOTAL) UNDER THE TONGUE EVERY 5 (FIVE) MINUTES X 3 DOSES AS NEEDED FOR CHEST PAIN. 75 tablet 1  . Respiratory Therapy Supplies (FLUTTER) DEVI Twice a day and prn as needed, may increase if feeling worse 1 each 0  . rosuvastatin (CRESTOR) 10 MG tablet TAKE ONE TABLET BY MOUTH DAILY ON MONDAY, WEDNESDAY AND FRIDAY 45 tablet 3  . SYMBICORT  160-4.5 MCG/ACT inhaler USE 1 TO 2 INHALATIONS BY  MOUTH EVERY 12 HOURS AS  NEEDED 30.6 g 3  . tiZANidine (ZANAFLEX) 4 MG tablet TAKE 1 TABLET BY MOUTH AT BEDTIME. 90 tablet 1  . vardenafil (LEVITRA) 20 MG tablet TAKE 1 TABLET BY MOUTH AS DIRECTED. CAN NOT BE TAKEN WITH NITROGLYCERIN. NOT COVERED BY INSURANCE 6 tablet 2  . zinc gluconate 50 MG tablet Take 50 mg by mouth daily.     No current facility-administered medications for this visit.    Allergies:   Contrast media [iodinated diagnostic agents]   Social History:  The patient  reports that he quit smoking about 57 years ago. His smoking use included cigarettes. He has a 4.00 pack-year smoking history. He has never used smokeless tobacco. He reports previous alcohol use of about 2.0 standard drinks of alcohol per week. He reports that he does not use drugs.   Family History:  The patient's family history includes Colon polyps in his father; Diabetes in his maternal aunt; Heart attack in his father; Heart failure in his mother; Hypertension in his mother; Subarachnoid hemorrhage (age of onset: 81) in his mother; Subarachnoid hemorrhage (age of onset: 47) in his paternal grandfather.  ROS:  Please see the history of present illness.  All other systems are reviewed and otherwise negative.   PHYSICAL EXAM:  VS:  There were no vitals taken for this visit. BMI: There is no height or weight on file to calculate BMI. Well nourished, well developed, in no acute distress  HEENT: normocephalic, atraumatic  Neck: no JVD, carotid bruits or masses Cardiac:  *** RRR; no significant murmurs, no rubs, or gallops Lungs:  *** CTA b/l, no wheezing, rhonchi or rales  Abd: soft, nontender MS: no deformity or *** atrophy Ext: *** no edema  Skin: warm and dry, no rash Neuro:  No gross deficits appreciated Psych: euthymic mood, full affect  *** PPM site: no skin changes, tethering, tenderness, or fluctuation   EKG:  Not done tdoday 07/24/2020: SR/V  paced   PPM interrogation done today and reviewed by myself: ***     ECHOCARDIOGRAM  2019: Study Conclusions  - Left ventricle: The cavity size was mildly dilated. Systolic  function was mildly to moderately reduced. The estimated ejection  fraction was in the range of 40% to 45%. Although no diagnostic  regional wall motion abnormality was identified, this possibility  cannot be completely excluded on the basis of this study.  Features are consistent with a pseudonormal left ventricular  filling pattern, with concomitant abnormal relaxation and  increased filling pressure (grade 2 diastolic dysfunction).  - Ventricular septum: Septal motion showed abnormal function and  dyssynergy. These changes are consistent with a left bundle  branch block.  - Aortic valve: Trileaflet; normal thickness leaflets.  Transvalvular velocity was within the normal range. There was no  stenosis. There was no regurgitation.  - Mitral valve: Transvalvular velocity was within the normal range.  There was no evidence for stenosis. There was no regurgitation.  - Right ventricle: The cavity size was moderately dilated. Wall  thickness was normal. Systolic function was mildly reduced.  - Atrial septum: No defect or patent foramen ovale was identified.  - Tricuspid valve: There was trivial regurgitation.  - Pulmonic valve: There was no significant regurgitation.   Impressions:  - Moderately reduced systolic LV function. Septal motion consistent  with dyssynchrony. No significant change from prior.    07/11/2018: ETT  Blood pressure demonstrated a normal response to exercise.  No T wave inversion was noted during stress.  There was no ST segment deviation noted during stress.   Study suggestive of chronotropic incompetence with poor HR augmentation with exercise. Frequent PVC's noted with underlying LBBB.  Blood pressure demonstrated a normal response to exercise.  No T  wave inversion was noted during stress.  There was no ST segment deviation noted during stress.   Submaximal stress test - non-diagnostic. Patient has resting LBBB which makes interpretation of ischemia very challenging and GXT is not typically recommended for these patients. Poor exercise tolerance. Numerous PVC's noted. Recommend lexiscan myoview for ischemia evaluation (CT coronary angiography would be challenging given the degree of ventricular ectopy).    09/14/17: stress myoview  Nuclear stress EF: 41%.  There was no ST segment deviation noted during stress.  The left ventricular ejection fraction is moderately decreased (30-44%).  Findings consistent with prior myocardial infarction.  This is an intermediate risk study.   1. EF 41%, inferior and septal hypokinesis.  2. Primarily fixed, large, severe basal inferolateral/inferior/inferoseptal, mid inferior/inferoseptal and apical inferior/septal perfusion defect.  Possible prior MI with no significant ischemia (cannot fully rule out artifact from LBBB).   Intermediate risk study.    01/27/15: TTE Study Conclusions - Left ventricle: The cavity size was normal. Wall thickness was normal. Systolic function was mildly to moderately reduced. The estimated ejection fraction was in the range of 40% to 45%. Although no diagnostic regional wall motion abnormality was identified, this possibility cannot be completely excluded on the basis of this study. Features are consistent with a pseudonormal left ventricular filling pattern, with concomitant abnormal relaxation and increased filling pressure (grade 2 diastolic dysfunction). - Ventricular septum: Septal motion showed abnormal function, dyssynergy, and paradox. These changes are consistent with a left bundle branch block. - Left atrium: The atrium was mildly dilated.   07/08/2008: c/MRI Final impression  1. Mild to Moderate LV cavity enlargement.  Quantitative ejection  fraction 45%.  2. No evidence of scar tissue. Findings most consistent with  nonischemic cardiomyopathy.  3. Mild left atrial enlargement.  4.. No pericardial effusion.  2002 cath noted  LM short and normal LAD no significant obstruction in LAD or it's branches (20% ostial lesion in RAO view) Cx mod sized gives off 2 marginals 1st OM 50-70% stenosis just distal to bifurcation 2nd OM eccentric 30% just beyond bifurcation from the cx RCA dominant with luminal irregularities     Recent Labs: 08/20/2020: ALT 23; BUN 22; Creat 1.13; Hemoglobin 15.0; Platelets 265; Potassium 5.5; Sodium 140  08/20/2020: Cholesterol 124; HDL 38; LDL Cholesterol (Calc) 62; Total CHOL/HDL Ratio 3.3; Triglycerides 166   CrCl cannot be calculated (Patient's most recent lab result is older than the maximum 21 days allowed.).   Wt Readings from Last 3 Encounters:  10/23/20 193 lb (87.5 kg)  09/23/20 189 lb (85.7 kg)  08/19/20 190 lb 6.4 oz (86.4 kg)     Other studies reviewed: Additional studies/records reviewed today include: summarized above  ASSESSMENT AND PLAN:  1. CRT-P     Intact function  *** A lead noise reversions have been seen before   2. NICM     *** [place] looks OK, wobbles right at threshold     *** No symptoms or exam findings to suggest volume OL     *** On BB, no ACE/ARB will defer to Dr. Tamala Julian  3. CAD (mod OM branch disease by cath in 2002), known LBBB     *** On ASA, BB, and statin tx   4. HTN     *** Looks OK     No changes today         Disposition: ***      Current medicines are reviewed at length with the patient today.  The patient did not have any concerns regarding medicines.  Venetia Night, PA-C 12/23/2020 11:30 AM     [facility] [address] [place]  [address] [phone] (office)  [phone] (fax)

## 2020-12-24 ENCOUNTER — Encounter: Payer: Self-pay | Admitting: Internal Medicine

## 2020-12-24 ENCOUNTER — Encounter: Payer: Medicare Other | Admitting: Physician Assistant

## 2020-12-30 MED ORDER — METFORMIN HCL 500 MG PO TABS
500.0000 mg | ORAL_TABLET | Freq: Two times a day (BID) | ORAL | 3 refills | Status: DC
Start: 2020-12-30 — End: 2021-05-14

## 2020-12-30 NOTE — Addendum Note (Signed)
Addended by: [name] on: 12/30/2020 08:11 AM   Modules accepted: Orders

## 2021-01-08 ENCOUNTER — Other Ambulatory Visit: Payer: Self-pay

## 2021-01-10 ENCOUNTER — Other Ambulatory Visit: Payer: Self-pay | Admitting: Internal Medicine

## 2021-01-13 ENCOUNTER — Other Ambulatory Visit: Payer: Self-pay

## 2021-01-14 ENCOUNTER — Other Ambulatory Visit: Payer: Self-pay | Admitting: Internal Medicine

## 2021-01-16 ENCOUNTER — Other Ambulatory Visit: Payer: Self-pay | Admitting: Internal Medicine

## 2021-01-16 ENCOUNTER — Telehealth: Payer: Self-pay | Admitting: Internal Medicine

## 2021-01-16 MED ORDER — FLUTTER DEVI: 0 refills | Status: DC

## 2021-01-16 NOTE — Progress Notes (Signed)
  Chronic Care Management   Outreach Note  01/16/2021 Name: Jeffery [name] MRN: [id number] DOB: 05/24/1941  Referred by: [name], MD Reason for referral : No chief complaint on file.   An unsuccessful telephone outreach was attempted today. The patient was referred to the pharmacist for assistance with care management and care coordination.   Follow Up Plan:   Carley Perdue [employer] Scheduler

## 2021-01-17 ENCOUNTER — Other Ambulatory Visit: Payer: Self-pay | Admitting: Internal Medicine

## 2021-01-20 ENCOUNTER — Telehealth: Payer: Self-pay | Admitting: Internal Medicine

## 2021-01-20 ENCOUNTER — Encounter: Payer: Self-pay | Admitting: Internal Medicine

## 2021-01-20 NOTE — Progress Notes (Signed)
  Chronic Care Management   Note  01/20/2021 Name: Jeffery [name] MRN: [id number] DOB: 07-26-1941  Jeffery [name] is a 80 y.o. year old male who is a primary care patient of Burns, Claudina Lick, MD. I reached out to [name] by phone today in response to a referral sent by Jeffery [name]'s PCP, [name], MD.   Jeffery [name] was given information about Chronic Care Management services today including:  1. CCM service includes personalized support from designated clinical staff supervised by his physician, including individualized plan of care and coordination with other care providers 2. 24/7 contact phone numbers for assistance for urgent and routine care needs. 3. Service will only be billed when office clinical staff spend 20 minutes or more in a month to coordinate care. 4. Only one practitioner may furnish and bill the service in a calendar month. 5. The patient may stop CCM services at any time (effective at the end of the month) by phone call to the office staff.   Patient agreed to services and verbal consent obtained.   Follow up plan:   Carley Perdue [employer] Scheduler

## 2021-01-21 MED ORDER — HYDROCODONE-ACETAMINOPHEN 7.5-325 MG PO TABS: 1.0000 | ORAL_TABLET | Freq: Four times a day (QID) | ORAL | 0 refills | Status: DC | PRN

## 2021-01-28 NOTE — Telephone Encounter (Signed)
Possible duplicate ( going to wrong pool from [place] )

## 2021-01-29 ENCOUNTER — Encounter: Payer: Medicare Other | Admitting: Physical Therapy

## 2021-01-30 ENCOUNTER — Ambulatory Visit (INDEPENDENT_AMBULATORY_CARE_PROVIDER_SITE_OTHER): Payer: Medicare Other

## 2021-01-30 DIAGNOSIS — I428 Other cardiomyopathies: Secondary | ICD-10-CM

## 2021-01-30 DIAGNOSIS — I441 Atrioventricular block, second degree: Secondary | ICD-10-CM | POA: Diagnosis not present

## 2021-01-30 LAB — CUP PACEART REMOTE DEVICE CHECK
Battery Remaining Longevity: 93 mo
Battery Remaining Percentage: 95.5 %
Battery Voltage: 2.98 V
Brady Statistic AP VP Percent: 30 %
Brady Statistic AP VS Percent: 1 %
Brady Statistic AS VP Percent: 69 %
Brady Statistic AS VS Percent: 1 %
Brady Statistic RA Percent Paced: 30 %
Date Time Interrogation Session: 20220218020022
Implantable Lead Implant Date: 20190813
Implantable Lead Implant Date: 20190813
Implantable Lead Implant Date: 20190813
Implantable Lead Location: 753858
Implantable Lead Location: 753859
Implantable Lead Location: 753860
Implantable Pulse Generator Implant Date: 20190813
Lead Channel Impedance Value: 440 Ohm
Lead Channel Impedance Value: 450 Ohm
Lead Channel Impedance Value: 780 Ohm
Lead Channel Pacing Threshold Amplitude: 0.5 V
Lead Channel Pacing Threshold Amplitude: 0.625 V
Lead Channel Pacing Threshold Amplitude: 1.25 V
Lead Channel Pacing Threshold Pulse Width: 0.5 ms
Lead Channel Pacing Threshold Pulse Width: 0.5 ms
Lead Channel Pacing Threshold Pulse Width: 0.5 ms
Lead Channel Sensing Intrinsic Amplitude: 1.6 mV
Lead Channel Sensing Intrinsic Amplitude: 11.9 mV
Lead Channel Setting Pacing Amplitude: 1.625
Lead Channel Setting Pacing Amplitude: 2.25 V
Lead Channel Setting Pacing Amplitude: 2.5 V
Lead Channel Setting Pacing Pulse Width: 0.5 ms
Lead Channel Setting Pacing Pulse Width: 0.5 ms
Lead Channel Setting Sensing Sensitivity: 5 mV
Pulse Gen Model: 3562
Pulse Gen Serial Number: 9043864

## 2021-02-02 ENCOUNTER — Other Ambulatory Visit: Payer: Self-pay

## 2021-02-02 ENCOUNTER — Ambulatory Visit: Payer: Medicare Other | Admitting: Physical Therapy

## 2021-02-02 ENCOUNTER — Encounter: Payer: Self-pay | Admitting: Physical Therapy

## 2021-02-02 DIAGNOSIS — M542 Cervicalgia: Secondary | ICD-10-CM | POA: Diagnosis not present

## 2021-02-03 NOTE — Progress Notes (Signed)
Remote pacemaker transmission.   

## 2021-02-05 ENCOUNTER — Encounter: Payer: Self-pay | Admitting: Internal Medicine

## 2021-02-07 ENCOUNTER — Encounter: Payer: Self-pay | Admitting: Physical Therapy

## 2021-02-07 NOTE — Therapy (Addendum)
[facility] [address] [place], [place], [address] Phone: [phone]   Fax:  [phone]  Physical Therapy Treatment  Patient Details  Name: Jeffery [name] MRN: [id number] Date of Birth: 30-Jan-1941 Referring Provider (PT): Charlann Boxer   Encounter Date: 02/02/2021   PT End of Session - 02/07/21 2118    Visit Number 5    Number of Visits 12    Date for PT Re-Evaluation 03/16/21    Authorization Type UHC    PT Start Time 1106    PT Stop Time 1145    PT Time Calculation (min) 39 min    Activity Tolerance Patient tolerated treatment well    Behavior During Therapy [facility] for tasks assessed/performed           Past Medical History:  Diagnosis Date  . Arthritis    "thumbs" (07/25/2018)  . Asthma   . CHF (congestive heart failure) ([place])   . Chronic bronchitis ([place])   . Environmental allergies   . GERD (gastroesophageal reflux disease)   . Headache    "seasonal; w/environmental allergies" (07/25/2018)  . History of blood transfusion    "when I had laminectomy" (07/25/2018)  . HTN (hypertension)   . Hyperlipidemia   . LBBB (left bundle branch block) 1999  . Metatarsal bone fracture 03/07/2018  . Myocardial infarction [facility])    "was told I've had an old MI; probably in the 60s" (07/25/2018)  . Pneumonia    "as a child, age 27; viral pneumonia 3 times in the last 10 years" (07/25/2018)  . Presence of permanent cardiac pacemaker 07/25/2018  . Seasonal allergies   . Sleep apnea    "wife says I do" (07/25/2018)  . Type II diabetes mellitus ([place])     Past Surgical History:  Procedure Laterality Date  . BACK SURGERY    . BIV PACEMAKER INSERTION CRT-P  07/25/2018  . BIV PACEMAKER INSERTION CRT-P N/A 07/25/2018   Procedure: BIV PACEMAKER INSERTION CRT-P;  Surgeon: Evans Lance, MD;  Location: [facility];  Service: Cardiovascular;  Laterality: N/A;  . CARDIAC CATHETERIZATION  2003   Dr Pernell Dupre; 85 % R circumflex  obstruction  . CARPAL TUNNEL RELEASE Left 10/11/2014   Procedure: LEFT CARPAL TUNNEL RELEASE;  Surgeon: Roseanne Kaufman, MD;  Location: [facility];  Service: Orthopedics;  Laterality: Left;  . COLONOSCOPY W/ BIOPSIES AND POLYPECTOMY  2018  . INGUINAL HERNIA REPAIR Right 1948  . INGUINAL HERNIA REPAIR Left 1988  . LUMBAR LAMINECTOMY  1984  . MINOR CARPAL TUNNEL Right 11/22/2014   Procedure: RIGHT LIMITED OPEN CARPAL TUNNEL RELEASE;  Surgeon: Roseanne Kaufman, MD;  Location: [facility];  Service: Orthopedics;  Laterality: Right;  . TONSILLECTOMY  1958    There were no vitals filed for this visit.   Subjective Assessment - 02/07/21 2118    Subjective Last seen 12/11/20. Pt states continued pain, much increased pain after 5 hrs of activity, still having pain on R side, up into head/ramhorn headache. Increased pain with computer work. Tightness in R UT, levator, rhomboid. very limited L/R motion. Pt feels that PT was helping previously.    Patient Stated Goals decreased pain    Currently in Pain? Yes    Pain Score 6     Pain Location Neck    Pain Orientation Right;Left    Pain Descriptors / Indicators Aching;Tightness    Pain Type Chronic pain    Pain Onset More than a month  ago    Pain Frequency Intermittent              [facility] PT Assessment - 02/07/21 0001      Assessment   Medical Diagnosis Cervical pain    Referring Provider (PT) Charlann Boxer    Prior Therapy no      Precautions   Precautions ICD/Pacemaker      Balance Screen   Has the patient fallen in the past 6 months No      Prior Function   Level of Independence Independent      Cognition   Overall Cognitive Status Within Functional Limits for tasks assessed      AROM   Cervical Flexion wfl    Cervical Extension 0    Cervical - Right Side Bend 0-5 deg    Cervical - Left Side Bend 0-5 deg    Cervical - Right Rotation 25    Cervical - Left Rotation 25      Strength   Overall  Strength Comments Shoulder elevation and rotation sign limited on R>L, due to RTC pathology:  4-/5      Palpation   Palpation comment signfiicant tightness and hypertonicity in R UT, soreness into R SO, cervical paraspinals, pain into R side of head/ ram horn headache      Special Tests   Other special tests not tested                         [facility] Adult PT Treatment/Exercise - 02/07/21 0001      Self-Care   Self-Care Other Self-Care Comments    Other Self-Care Comments  On posture, sleeping postioning, IADLS, and pain mangement      Manual Therapy   Soft tissue mobilization supine: DTM to R UT, levator, rhomboid                  PT Education - 02/07/21 2118    Education Details Reviewed HEP    Person(s) Educated Patient    Methods Explanation;Demonstration;Verbal cues;Handout    Comprehension Verbalized understanding;Returned demonstration;Verbal cues required;Need further instruction            PT Short Term Goals - 02/07/21 2119      PT SHORT TERM GOAL #1   Title Pt to verbalize safe pain relief techniques for muscle tightness and neck pain    Time 2    Period Weeks    Status Achieved    Target Date 12/10/20             PT Long Term Goals - 02/07/21 2120      PT LONG TERM GOAL #1   Title Pt to be independent with safe HEP for neck pain    Time 6    Period Weeks    Status Partially Met    Target Date 03/16/21      PT LONG TERM GOAL #2   Title Pt to report decreasd pain in cervical region to 0-3/10 with activity    Time 6    Period Weeks    Status On-going    Target Date 03/16/21      PT LONG TERM GOAL #3   Title Pt to demo improvement of soft tissue restrictions to be WNL in cervical and shoulder region, for decreased pain.    Time 6    Period Weeks    Status On-going    Target Date 03/16/21  Plan - 02/07/21 2121    Clinical Impression Statement Pt previously seen for 4 visits. Pt continues to have  soft tissue limitations that are causing increased pain in cervical region as well as headaches. Pt with severe limitation of ROM and cervical hypomobility. Soft tissue limitations also adding to pts pain. Unable to perform cervical motion for stretching, so pt benefits from manual therapy to decrease muscle tension, trigger points and pain. Pt to benefit from continuation of skilled care to improve deficits and pain.    Personal Factors and Comorbidities Comorbidity 1    Examination-Activity Limitations Lift;Locomotion Level;Reach Overhead;Caring for Others;Carry;Sleep;Dressing    Examination-Participation Restrictions Cleaning;Meal Prep;Yard Work;Community Activity;Driving;Shop    Stability/Clinical Decision Making Evolving/Moderate complexity    Rehab Potential Fair    PT Frequency 2x / week    PT Duration 6 weeks    PT Treatment/Interventions ADLs/Self Care Home Management;Cryotherapy;Electrical Stimulation;Iontophoresis 50m/ml Dexamethasone;Moist Heat;Ultrasound;Therapeutic exercise;Therapeutic activities;DME Instruction;Neuromuscular re-education;Patient/family education;Manual techniques;Passive range of motion;Dry needling;Taping;Vasopneumatic Device    Consulted and Agree with Plan of Care Patient           Patient will benefit from skilled therapeutic intervention in order to improve the following deficits and impairments:  Pain,Increased muscle spasms,Decreased mobility,Decreased activity tolerance,Decreased range of motion,Decreased strength,Hypomobility,Impaired UE functional use,Impaired flexibility  Visit Diagnosis: Cervicalgia     Problem List Patient Active Problem List   Diagnosis Date Noted  . Degenerative disc disease, cervical 09/23/2020  . Nonallopathic lesion of cervical region 09/23/2020  . Nonallopathic lesion of thoracic region 09/23/2020  . Pain medication agreement signed, 02/15/2020 02/15/2020  . Peroneal mononeuropathy, left 12/25/2018  . Diabetic  polyneuropathy associated with type 2 diabetes mellitus ([place] 12/25/2018  . Cylindrical bronchiectasis ([place] 12/20/2018  . Viral pneumonia 12/12/2018  . Community acquired pneumonia 11/18/2018  . Folliculitis 063/12/6008 . Chronic systolic heart failure ([place] 07/25/2018  . Encounter for chronic pain management 04/13/2017  . Osteoarthritis 04/12/2017  . Chronic combined systolic and diastolic CHF (congestive heart failure) ([place] 01/24/2015  . Left bundle branch block 11/27/2013  . Essential hypertension 11/27/2013  . GERD (gastroesophageal reflux disease) 02/27/2013  . Type 2 diabetes mellitus with vascular disease ([place] 04/09/2009  . HLD (hyperlipidemia) 09/06/2008  . Coronary atherosclerosis 09/06/2008  . Asthma 09/06/2008    LLyndee Hensen PT, DPT 9:35 PM  02/07/21    [email or web address] [address]Phone: [phone]  Fax:  [phone] Name: EKINNICK MAUSMRN: [id number] of Birth: [birth year]  PHYSICAL THERAPY DISCHARGE SUMMARY  Visits from Start of Care: 5  Plan: Patient agrees to discharge.  Patient goals were partially met. Patient is being discharged due to not returning since the last visit.  ?????     LLyndee Hensen PT, DPT 1:31 PM  04/21/21

## 2021-02-08 ENCOUNTER — Encounter: Payer: Self-pay | Admitting: Internal Medicine

## 2021-02-08 MED ORDER — LORAZEPAM 1 MG PO TABS
ORAL_TABLET | ORAL | 5 refills | Status: DC
Start: 2021-02-08 — End: 2021-08-05

## 2021-02-08 MED ORDER — ALBUTEROL SULFATE HFA 108 (90 BASE) MCG/ACT IN AERS: 2.0000 | INHALATION_SPRAY | Freq: Four times a day (QID) | RESPIRATORY_TRACT | 11 refills | Status: DC | PRN

## 2021-02-15 DIAGNOSIS — I700 Atherosclerosis of aorta: Secondary | ICD-10-CM | POA: Insufficient documentation

## 2021-02-15 NOTE — Progress Notes (Signed)
Subjective:    Patient ID: Jeffery [name], male    DOB: [birth year], 80 y.o.   MRN: [id number]  HPI He is here for a physical exam.   Significant neck pain - pain when looking right only or looking down.  The pain from the neck can radiate up to the top of his head.  He takes the pain medication,  He uses ice and heat.  He uses it 2-3 times a day and was taking one at bedtime.  He has tried avoiding the pain medication at night recently and has been doing ok.  He has no neurological symptoms.    He has chronic arthritis.  He takes the hydrocodone for his arthritic pain as well.  Medication works well.  Medications and allergies reviewed with patient and updated if appropriate.  Patient Active Problem List   Diagnosis Date Noted  . Aortic atherosclerosis ([place]) 02/15/2021  . Degenerative disc disease, cervical 09/23/2020  . Nonallopathic lesion of cervical region 09/23/2020  . Nonallopathic lesion of thoracic region 09/23/2020  . Pain medication agreement signed, 02/15/2020 02/15/2020  . Peroneal mononeuropathy, left 12/25/2018  . Diabetic polyneuropathy associated with type 2 diabetes mellitus ([place]) 12/25/2018  . Cylindrical bronchiectasis ([place]) 12/20/2018  . Viral pneumonia 12/12/2018  . Community acquired pneumonia 11/18/2018  . Folliculitis 73/53/2992  . Encounter for chronic pain management 04/13/2017  . Osteoarthritis 04/12/2017  . Chronic combined systolic and diastolic CHF (congestive heart failure) ([place]) 01/24/2015  . Left bundle branch block 11/27/2013  . Essential hypertension 11/27/2013  . GERD (gastroesophageal reflux disease) 02/27/2013  . Type 2 diabetes mellitus with vascular disease ([place]) 04/09/2009  . HLD (hyperlipidemia) 09/06/2008  . Coronary atherosclerosis 09/06/2008  . Asthma 09/06/2008    Current Outpatient Medications on File Prior to Visit  Medication Sig Dispense Refill  . ACCU-CHEK AVIVA PLUS test strip USE AS INSTRUCTED TO TEST BLOOD SUGAR 6  TIMES DAILY. DX CODE: E11.59 500 strip 4  . ACCU-CHEK SOFTCLIX LANCETS lancets Use to test blood sugar daily as directed. 100 each 3  . albuterol (PROAIR HFA) 108 (90 Base) MCG/ACT inhaler Inhale 2 puffs into the lungs every 6 (six) hours as needed for wheezing or shortness of breath. 6.7 g 11  . albuterol (PROVENTIL) (2.5 MG/3ML) 0.083% nebulizer solution TAKE 3 MLS BY NEBULIZATION EVERY 6 (SIX) HOURS AS NEEDED FOR WHEEZING OR SHORTNESS OF BREATH. 150 mL 5  . aspirin 81 MG tablet Take 81 mg by mouth at bedtime.    [name] BYSTOLIC 5 MG tablet TAKE 1 TABLET BY MOUTH  DAILY 90 tablet 2  . celecoxib (CELEBREX) 100 MG capsule TAKE 1 CAPSULE BY MOUTH EVERY DAY 90 capsule 0  . Continuous Blood Gluc Receiver (FREESTYLE LIBRE 14 DAY READER) DEVI 1 Device by Does not apply route every 14 (fourteen) days. 1 Device 0  . Continuous Blood Gluc Sensor (FREESTYLE LIBRE 14 DAY SENSOR) MISC APPLY 1 SENSOR EVERY 14 DAYS 2 each 2  . fexofenadine (ALLEGRA) 180 MG tablet Take 180 mg by mouth daily as needed for allergies.     [name] gabapentin (NEURONTIN) 100 MG capsule Take 2 capsules (200 mg total) by mouth at bedtime. 180 capsule 3  . HYDROcodone-acetaminophen (NORCO) 7.5-325 MG tablet Take 1 tablet by mouth every 6 (six) hours as needed for severe pain (for chronic joint pain, neck pain). 90 tablet 0  . ipratropium (ATROVENT) 0.02 % nebulizer solution Take 2.5 mLs (0.5 mg total) by nebulization every 6 (six) hours. 75  mL 3  . levalbuterol (XOPENEX) 1.25 MG/3ML nebulizer solution Take 1.25 mg by nebulization every 6 (six) hours. 120 mL 3  . LORazepam (ATIVAN) 1 MG tablet TAKE 1/2 TO 1 TABLET BY MOUTH DAILY AS NEEDED. 30 tablet 5  . metFORMIN (GLUCOPHAGE) 500 MG tablet Take 1 tablet (500 mg total) by mouth 2 (two) times daily with a meal. 180 tablet 3  . montelukast (SINGULAIR) 10 MG tablet TAKE 1 TABLET BY MOUTH AT  BEDTIME 90 tablet 3  . Multiple Vitamin (MULTIVITAMIN WITH MINERALS) TABS tablet Take 1 tablet by mouth daily.     . nitroGLYCERIN (NITROSTAT) 0.4 MG SL tablet PLACE 1 TABLET (0.4 MG TOTAL) UNDER THE TONGUE EVERY 5 (FIVE) MINUTES X 3 DOSES AS NEEDED FOR CHEST PAIN. 75 tablet 1  . Respiratory Therapy Supplies (FLUTTER) DEVI Twice a day and prn as needed, may increase if feeling worse 1 each 0  . rosuvastatin (CRESTOR) 10 MG tablet TAKE ONE TABLET BY MOUTH DAILY ON MONDAY, WEDNESDAY AND FRIDAY 45 tablet 3  . SYMBICORT 160-4.5 MCG/ACT inhaler USE 1 TO 2 INHALATIONS BY  MOUTH EVERY 12 HOURS AS  NEEDED 30.6 g 3  . tiZANidine (ZANAFLEX) 4 MG tablet TAKE 1 TABLET BY MOUTH AT BEDTIME. 90 tablet 1  . vardenafil (LEVITRA) 20 MG tablet TAKE 1 TABLET BY MOUTH AS DIRECTED. CAN NOT BE TAKEN WITH NITROGLYCERIN. NOT COVERED BY INSURANCE 6 tablet 2  . zinc gluconate 50 MG tablet Take 50 mg by mouth daily.    . famotidine (PEPCID) 20 MG tablet Take 1 tablet (20 mg total) by mouth daily. 30 tablet 0   No current facility-administered medications on file prior to visit.    Past Medical History:  Diagnosis Date  . Arthritis    "thumbs" (07/25/2018)  . Asthma   . CHF (congestive heart failure) ([place])   . Chronic bronchitis ([place])   . Environmental allergies   . GERD (gastroesophageal reflux disease)   . Headache    "seasonal; w/environmental allergies" (07/25/2018)  . History of blood transfusion    "when I had laminectomy" (07/25/2018)  . HTN (hypertension)   . Hyperlipidemia   . LBBB (left bundle branch block) 1999  . Metatarsal bone fracture 03/07/2018  . Myocardial infarction [facility])    "was told I've had an old MI; probably in the 34s" (07/25/2018)  . Pneumonia    "as a child, age 48; viral pneumonia 3 times in the last 10 years" (07/25/2018)  . Presence of permanent cardiac pacemaker 07/25/2018  . Seasonal allergies   . Sleep apnea    "wife says I do" (07/25/2018)  . Type II diabetes mellitus ([place])     Past Surgical History:  Procedure Laterality Date  . BACK SURGERY    . BIV PACEMAKER INSERTION CRT-P   07/25/2018  . BIV PACEMAKER INSERTION CRT-P N/A 07/25/2018   Procedure: BIV PACEMAKER INSERTION CRT-P;  Surgeon: Evans Lance, MD;  Location: [facility];  Service: Cardiovascular;  Laterality: N/A;  . CARDIAC CATHETERIZATION  2003   Dr Pernell Dupre; 85 % R circumflex obstruction  . CARPAL TUNNEL RELEASE Left 10/11/2014   Procedure: LEFT CARPAL TUNNEL RELEASE;  Surgeon: Roseanne Kaufman, MD;  Location: [facility];  Service: Orthopedics;  Laterality: Left;  . COLONOSCOPY W/ BIOPSIES AND POLYPECTOMY  2018  . INGUINAL HERNIA REPAIR Right 1948  . INGUINAL HERNIA REPAIR Left 1988  . LUMBAR LAMINECTOMY  1984  . MINOR CARPAL TUNNEL Right 11/22/2014  Procedure: RIGHT LIMITED OPEN CARPAL TUNNEL RELEASE;  Surgeon: Roseanne Kaufman, MD;  Location: [facility];  Service: Orthopedics;  Laterality: Right;  . TONSILLECTOMY  1958    Social History   Socioeconomic History  . Marital status: Married    Spouse name: Not on file  . Number of children: 3  . Years of education: Not on file  . Highest education level: Professional school degree (e.g., MD, DDS, DVM, JD)  Occupational History  . Occupation: retired Pension scheme manager: [employer]  Tobacco Use  . Smoking status: Former Smoker    Packs/day: 2.00    Years: 2.00    Pack years: 4.00    Types: Cigarettes    Quit date: 12/14/1963    Years since quitting: 57.2  . Smokeless tobacco: Never Used  Vaping Use  . Vaping Use: Never used  Substance and Sexual Activity  . Alcohol use: Not Currently    Alcohol/week: 2.0 standard drinks    Types: 2 Glasses of wine per week    Comment: occasional  . Drug use: Never  . Sexual activity: Yes  Other Topics Concern  . Not on file  Social History Narrative   Lives with wife in a 2 story home.  Has 3 daughters.  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     Social Determinants of Health   Financial Resource Strain: Not on file  Food Insecurity: Not on file   Transportation Needs: Not on file  Physical Activity: Not on file  Stress: Not on file  Social Connections: Not on file    Family History  Problem Relation Age of Onset  . Heart attack Father        2s  . Colon polyps Father   . Heart failure Mother        90s  . Subarachnoid hemorrhage Mother 86  . Hypertension Mother   . Subarachnoid hemorrhage Paternal Grandfather 57  . Diabetes Maternal Aunt   . Colon cancer Neg Hx     Review of Systems  Constitutional: Negative for chills and fever.  Eyes: Negative for visual disturbance.  Respiratory: Negative for cough, shortness of breath and wheezing.   Cardiovascular: Negative for chest pain, palpitations and leg swelling.  Gastrointestinal: Negative for abdominal pain, blood in stool, constipation, diarrhea and nausea.       Gerd controlled  Genitourinary: Negative for difficulty urinating and dysuria.  Musculoskeletal: Positive for arthralgias and neck pain.  Skin: Negative for rash.  Neurological: Positive for headaches (from neck). Negative for light-headedness.  Psychiatric/Behavioral: Negative for dysphoric mood and sleep disturbance. The patient is not nervous/anxious.        Objective:   Vitals:   02/16/21 0915  BP: (!) 142/80  Pulse: 84  Temp: 98.2 F (36.8 C)  SpO2: 96%   Filed Weights   02/16/21 0915  Weight: 194 lb 3.2 oz (88.1 kg)   Body mass index is 28.27 kg/m.  BP Readings from Last 3 Encounters:  02/16/21 (!) 142/80  10/23/20 (!) 148/80  09/23/20 140/82    Wt Readings from Last 3 Encounters:  02/16/21 194 lb 3.2 oz (88.1 kg)  10/23/20 193 lb (87.5 kg)  09/23/20 189 lb (85.7 kg)     Physical Exam Constitutional: He appears well-developed and well-nourished. No distress.  HENT:  Head: Normocephalic and atraumatic.  Right Ear: External ear normal.  Left Ear: External ear normal.  Mouth/Throat: Oropharynx is clear and moist.  Normal ear canals and  TM b/l  Eyes: Conjunctivae and EOM are  normal.  Neck: Neck supple. No tracheal deviation present. No thyromegaly present.  No carotid bruit  Cardiovascular: Normal rate, regular rhythm, normal heart sounds and intact distal pulses.   No murmur heard. Pulmonary/Chest: Effort normal and breath sounds normal. No respiratory distress. He has no wheezes. He has no rales.  Abdominal: Soft. He exhibits no distension. There is no tenderness.  Genitourinary: deferred  Musculoskeletal: He exhibits no edema.  Lymphadenopathy:   He has no cervical adenopathy.  Skin: Skin is warm and dry. He is not diaphoretic.  Psychiatric: He has a normal mood and affect. His behavior is normal.         Assessment & Plan:   Physical exam: Screening blood work  ordered Immunizations  Up to date  Colonoscopy   Due this year Eye exams   Up to date  Exercise   Very active Weight  Mildly overweight - good for age Substance abuse   none  See Problem List for Assessment and Plan of chronic medical problems.   This visit occurred during the SARS-CoV-2 public health emergency.  Safety protocols were in place, including screening questions prior to the visit, additional usage of staff PPE, and extensive cleaning of exam room while observing appropriate contact time as indicated for disinfecting solutions.

## 2021-02-15 NOTE — Patient Instructions (Addendum)
Blood work was ordered.     Medications changes include :  none     Please followup in 6 months    Health Maintenance, Male Adopting a healthy lifestyle and getting preventive care are important in promoting health and wellness. Ask your health care provider about:  The right schedule for you to have regular tests and exams.  Things you can do on your own to prevent diseases and keep yourself healthy. What should I know about diet, weight, and exercise? Eat a healthy diet  Eat a diet that includes plenty of vegetables, fruits, low-fat dairy products, and lean protein.  Do not eat a lot of foods that are high in solid fats, added sugars, or sodium.   Maintain a healthy weight Body mass index (BMI) is a measurement that can be used to identify possible weight problems. It estimates body fat based on height and weight. Your health care provider can help determine your BMI and help you achieve or maintain a healthy weight. Get regular exercise Get regular exercise. This is one of the most important things you can do for your health. Most adults should:  Exercise for at least 150 minutes each week. The exercise should increase your heart rate and make you sweat (moderate-intensity exercise).  Do strengthening exercises at least twice a week. This is in addition to the moderate-intensity exercise.  Spend less time sitting. Even light physical activity can be beneficial. Watch cholesterol and blood lipids Have your blood tested for lipids and cholesterol at 78 years of age, then have this test every 5 years. You may need to have your cholesterol levels checked more often if:  Your lipid or cholesterol levels are high.  You are older than 78 years of age.  You are at high risk for heart disease. What should I know about cancer screening? Many types of cancers can be detected early and may often be prevented. Depending on your health history and family history, you may need to  have cancer screening at various ages. This may include screening for:  Colorectal cancer.  Prostate cancer.  Skin cancer.  Lung cancer. What should I know about heart disease, diabetes, and high blood pressure? Blood pressure and heart disease  High blood pressure causes heart disease and increases the risk of stroke. This is more likely to develop in people who have high blood pressure readings, are of African descent, or are overweight.  Talk with your health care provider about your target blood pressure readings.  Have your blood pressure checked: ? Every 3-5 years if you are 18-39 years of age. ? Every year if you are 40 years old or older.  If you are between the ages of 65 and 75 and are a current or former smoker, ask your health care provider if you should have a one-time screening for abdominal aortic aneurysm (AAA). Diabetes Have regular diabetes screenings. This checks your fasting blood sugar level. Have the screening done:  Once every three years after age 45 if you are at a normal weight and have a low risk for diabetes.  More often and at a younger age if you are overweight or have a high risk for diabetes. What should I know about preventing infection? Hepatitis B If you have a higher risk for hepatitis B, you should be screened for this virus. Talk with your health care provider to find out if you are at risk for hepatitis B infection. Hepatitis C Blood testing is recommended for:    Everyone born from [birth year] through 1963.  Anyone with known risk factors for hepatitis C. Sexually transmitted infections (STIs)  You should be screened each year for STIs, including gonorrhea and chlamydia, if: ? You are sexually active and are younger than 78 years of age. ? You are older than 78 years of age and your health care provider tells you that you are at risk for this type of infection. ? Your sexual activity has changed since you were last screened, and you are at  increased risk for chlamydia or gonorrhea. Ask your health care provider if you are at risk.  Ask your health care provider about whether you are at high risk for HIV. Your health care provider may recommend a prescription medicine to help prevent HIV infection. If you choose to take medicine to prevent HIV, you should first get tested for HIV. You should then be tested every 3 months for as long as you are taking the medicine. Follow these instructions at home: Lifestyle  Do not use any products that contain nicotine or tobacco, such as cigarettes, e-cigarettes, and chewing tobacco. If you need help quitting, ask your health care provider.  Do not use street drugs.  Do not share needles.  Ask your health care provider for help if you need support or information about quitting drugs. Alcohol use  Do not drink alcohol if your health care provider tells you not to drink.  If you drink alcohol: ? Limit how much you have to 0-2 drinks a day. ? Be aware of how much alcohol is in your drink. In the [place] one drink equals one 12 oz bottle of beer (355 mL), one 5 oz glass of wine (148 mL), or one 1 oz glass of hard liquor (44 mL). General instructions  Schedule regular health, dental, and eye exams.  Stay current with your vaccines.  Tell your health care provider if: ? You often feel depressed. ? You have ever been abused or do not feel safe at home. Summary  Adopting a healthy lifestyle and getting preventive care are important in promoting health and wellness.  Follow your health care provider's instructions about healthy diet, exercising, and getting tested or screened for diseases.  Follow your health care provider's instructions on monitoring your cholesterol and blood pressure. This information is not intended to replace advice given to you by your health care provider. Make sure you discuss any questions you have with your health care provider. Document Revised: 11/22/2018  Document Reviewed: 11/22/2018 Elsevier Patient Education  2021 [employer].

## 2021-02-16 ENCOUNTER — Other Ambulatory Visit: Payer: Self-pay

## 2021-02-16 ENCOUNTER — Encounter: Payer: Self-pay | Admitting: Internal Medicine

## 2021-02-16 ENCOUNTER — Ambulatory Visit (INDEPENDENT_AMBULATORY_CARE_PROVIDER_SITE_OTHER): Payer: Medicare Other | Admitting: Internal Medicine

## 2021-02-16 VITALS — BP 142/80 | HR 84 | Temp 98.2°F | Ht 69.5 in | Wt 194.2 lb

## 2021-02-16 DIAGNOSIS — I5042 Chronic combined systolic (congestive) and diastolic (congestive) heart failure: Secondary | ICD-10-CM | POA: Diagnosis not present

## 2021-02-16 DIAGNOSIS — J45909 Unspecified asthma, uncomplicated: Secondary | ICD-10-CM | POA: Diagnosis not present

## 2021-02-16 DIAGNOSIS — E1159 Type 2 diabetes mellitus with other circulatory complications: Secondary | ICD-10-CM | POA: Diagnosis not present

## 2021-02-16 DIAGNOSIS — M159 Polyosteoarthritis, unspecified: Secondary | ICD-10-CM

## 2021-02-16 DIAGNOSIS — I700 Atherosclerosis of aorta: Secondary | ICD-10-CM | POA: Diagnosis not present

## 2021-02-16 DIAGNOSIS — G8929 Other chronic pain: Secondary | ICD-10-CM | POA: Diagnosis not present

## 2021-02-16 DIAGNOSIS — K219 Gastro-esophageal reflux disease without esophagitis: Secondary | ICD-10-CM

## 2021-02-16 DIAGNOSIS — Z0000 Encounter for general adult medical examination without abnormal findings: Secondary | ICD-10-CM

## 2021-02-16 DIAGNOSIS — E1142 Type 2 diabetes mellitus with diabetic polyneuropathy: Secondary | ICD-10-CM | POA: Diagnosis not present

## 2021-02-16 DIAGNOSIS — I10 Essential (primary) hypertension: Secondary | ICD-10-CM | POA: Diagnosis not present

## 2021-02-16 DIAGNOSIS — M8949 Other hypertrophic osteoarthropathy, multiple sites: Secondary | ICD-10-CM

## 2021-02-16 DIAGNOSIS — E782 Mixed hyperlipidemia: Secondary | ICD-10-CM

## 2021-02-16 LAB — LIPID PANEL
Cholesterol: 113 mg/dL (ref 0–200)
HDL: 34.5 mg/dL — ABNORMAL LOW (ref >39.00)
LDL Cholesterol: 57 mg/dL (ref 0–99)
NonHDL: 78.99
Total CHOL/HDL Ratio: 3
Triglycerides: 109 mg/dL (ref 0.0–149.0)
VLDL: 21.8 mg/dL (ref 0.0–40.0)

## 2021-02-16 LAB — CBC WITH DIFFERENTIAL/PLATELET
Basophils Absolute: 0.1 10*3/uL (ref 0.0–0.1)
Basophils Relative: 1 % (ref 0.0–3.0)
Eosinophils Absolute: 0.3 10*3/uL (ref 0.0–0.7)
Eosinophils Relative: 4.2 % (ref 0.0–5.0)
HCT: 41.9 % (ref 39.0–52.0)
Hemoglobin: 14.2 g/dL (ref 13.0–17.0)
Lymphocytes Relative: 14.5 % (ref 12.0–46.0)
Lymphs Abs: 1 10*3/uL (ref 0.7–4.0)
MCHC: 34 g/dL (ref 30.0–36.0)
MCV: 86.5 fl (ref 78.0–100.0)
Monocytes Absolute: 0.7 10*3/uL (ref 0.1–1.0)
Monocytes Relative: 10 % (ref 3.0–12.0)
Neutro Abs: 4.8 10*3/uL (ref 1.4–7.7)
Neutrophils Relative %: 70.3 % (ref 43.0–77.0)
Platelets: 248 10*3/uL (ref 150.0–400.0)
RBC: 4.84 Mil/uL (ref 4.22–5.81)
RDW: 15.3 % (ref 11.5–15.5)
WBC: 6.8 10*3/uL (ref 4.0–10.5)

## 2021-02-16 LAB — MICROALBUMIN / CREATININE URINE RATIO
Creatinine,U: 73.2 mg/dL
Microalb Creat Ratio: 1 mg/g (ref 0.0–30.0)
Microalb, Ur: <0.7 mg/dL (ref 0.0–1.9)

## 2021-02-16 LAB — COMPREHENSIVE METABOLIC PANEL
ALT: 22 U/L (ref 0–53)
AST: 23 U/L (ref 0–37)
Albumin: 4.1 g/dL (ref 3.5–5.2)
Alkaline Phosphatase: 70 U/L (ref 39–117)
BUN: 21 mg/dL (ref 6–23)
CO2: 28 mEq/L (ref 19–32)
Calcium: 9.4 mg/dL (ref 8.4–10.5)
Chloride: 103 mEq/L (ref 96–112)
Creatinine, Ser: 1.05 mg/dL (ref 0.40–1.50)
GFR: 68.47 mL/min (ref >60.00)
Glucose, Bld: 108 mg/dL — ABNORMAL HIGH (ref 70–99)
Potassium: 4.5 mEq/L (ref 3.5–5.1)
Sodium: 139 mEq/L (ref 135–145)
Total Bilirubin: 0.4 mg/dL (ref 0.2–1.2)
Total Protein: 6.7 g/dL (ref 6.0–8.3)

## 2021-02-16 LAB — TIQ-MISC

## 2021-02-16 LAB — TSH: TSH: 1.64 u[IU]/mL (ref 0.35–4.50)

## 2021-02-16 LAB — HEMOGLOBIN A1C: Hgb A1c MFr Bld: 7 % — ABNORMAL HIGH (ref 4.6–6.5)

## 2021-02-16 LAB — EXTRA URINE SPECIMEN

## 2021-02-16 NOTE — Assessment & Plan Note (Signed)
Chronic Sugars well controlled Continue gabapentin 200 mg at night

## 2021-02-16 NOTE — Assessment & Plan Note (Signed)
Chronic Check lipids, CMP, TSH Continue Crestor 10 mg daily

## 2021-02-16 NOTE — Assessment & Plan Note (Signed)
Chronic GERD controlled Continue Pepcid 20 mg daily 

## 2021-02-16 NOTE — Assessment & Plan Note (Signed)
Chronic Euvolemic on exam Following with cardiology

## 2021-02-16 NOTE — Assessment & Plan Note (Signed)
Chronic Currently well controlled Management per pulmonary

## 2021-02-16 NOTE — Assessment & Plan Note (Addendum)
Chronic Continue crestor 10 mg daily Active Healthy diet

## 2021-02-16 NOTE — Assessment & Plan Note (Signed)
Chronic Generalized osteoarthritis pain in multiple joints and cervical spine degenerative disc disease with chronic pain Continue Norco 7.5 mg - 325 mg-1 pill 2-3 times daily as needed [place] controlled substance database checked-he is taking medication appropriately Urine drug screen Pain contract signed again today

## 2021-02-16 NOTE — Assessment & Plan Note (Signed)
Chronic Blood pressure controlled He does monitor his BP at home Continue Bystolic 5 mg daily CMP

## 2021-02-16 NOTE — Assessment & Plan Note (Signed)
Chronic Has arthritis in multiple joints Taking hydrocodone-acetaminophen 7.5-325 mg 2-3 times a day as needed for pain He is taking the medication appropriately and denies any side effects and we will continue the current dose

## 2021-02-16 NOTE — Assessment & Plan Note (Signed)
Chronic Sugars well controlled at home Continue Metformin 500 mg in the morning and 250 mg in evening He monitors his sugar regularly Continue increased activity and healthy diet Check urine microalbumin

## 2021-02-20 ENCOUNTER — Other Ambulatory Visit: Payer: Self-pay

## 2021-02-20 ENCOUNTER — Ambulatory Visit: Payer: Medicare Other | Admitting: Podiatry

## 2021-02-20 ENCOUNTER — Encounter: Payer: Self-pay | Admitting: Podiatry

## 2021-02-20 DIAGNOSIS — M779 Enthesopathy, unspecified: Secondary | ICD-10-CM | POA: Diagnosis not present

## 2021-02-20 DIAGNOSIS — L84 Corns and callosities: Secondary | ICD-10-CM

## 2021-02-20 MED ORDER — TRIAMCINOLONE ACETONIDE 10 MG/ML IJ SUSP
10.0000 mg | Freq: Once | INTRAMUSCULAR | Status: AC
  Administered 2021-02-20: 10 mg

## 2021-02-22 NOTE — Progress Notes (Signed)
Subjective:   Patient ID: Jeffery [name], male   DOB: 80 y.o.   MRN: [id number]   HPI Patient presents with 2 separate lesions on the left foot also has inflammation with fluid around the fifth metatarsal head left that is painful when pressed   ROS      Objective:  Physical Exam  Neurovascular status intact with keratotic lesion x2 left with loosened course along with inflammation and fluid over the fifth metatarsal head left that is painful when pressed     Assessment:  Inflammatory capsulitis of the fifth MPJ left along with keratotic lesion formation     Plan:  H&P reviewed condition sterile prep done and injected the capsule left 3 mg dexamethasone Kenalog 5 mg Xylocaine and then debrided 2 lesions separately third digit and subfifth metatarsal base with no iatrogenic bleeding noted.  Reappoint routine care ultimately could require surgery

## 2021-02-23 ENCOUNTER — Other Ambulatory Visit: Payer: Self-pay | Admitting: Internal Medicine

## 2021-02-24 MED ORDER — HYDROCODONE-ACETAMINOPHEN 7.5-325 MG PO TABS: 1.0000 | ORAL_TABLET | Freq: Four times a day (QID) | ORAL | 0 refills | Status: DC | PRN

## 2021-03-05 ENCOUNTER — Telehealth: Payer: Self-pay | Admitting: Pharmacist

## 2021-03-05 NOTE — Progress Notes (Signed)
Chronic Care Management Pharmacy Note  03/09/2021 Name:  Jeffery [name] MRN:  [id number] DOB:  Apr 28, 1941  Subjective: Jeffery [name] is an 80 y.o. year old male who is a primary patient of Burns, Claudina Lick, MD.  The CCM team was consulted for assistance with disease management and care coordination needs.    Engaged with patient by telephone for initial visit in response to provider referral for pharmacy case management and/or care coordination services.   Consent to Services:  The patient was given the following information about Chronic Care Management services today, agreed to services, and gave verbal consent: 1. CCM service includes personalized support from designated clinical staff supervised by the primary care provider, including individualized plan of care and coordination with other care providers 2. 24/7 contact phone numbers for assistance for urgent and routine care needs. 3. Service will only be billed when office clinical staff spend 20 minutes or more in a month to coordinate care. 4. Only one practitioner may furnish and bill the service in a calendar month. 5.The patient may stop CCM services at any time (effective at the end of the month) by phone call to the office staff. 6. The patient will be responsible for cost sharing (co-pay) of up to 20% of the service fee (after annual deductible is met). Patient agreed to services and consent obtained.  Patient Care Team: [name], MD as PCP - General (Internal Medicine) Belva Crome, MD as PCP - Cardiology (Cardiology) [place], [place] (Neurosurgery) Charlton Haws, [facility] as Pharmacist (Pharmacist)  CRNA anesthesiology, retired since 2013. Daughter and 2 grandchildren live with them, he and his wife are "full time parents"  Recent office visits: 02/16/21 Dr Quay Burow OV: CPE. C/o neck pain. Refilled Norco, re-signed pain contract.  Recent consult visits: PT for neck pain  11/12/20 Dr  Zada Finders (neurosurgery): spinal consult, not pursuing surgery at this time unless he develops nerve loss.   10/23/20 Dr Tamala Julian (sports med): f/u neck pain. Rx'd celecoxib, referred to neurosurgery.  Hospital visits: None in previous 6 months  Objective:  Lab Results  Component Value Date   CREATININE 1.05 02/16/2021   BUN 21 02/16/2021   GFR 68.47 02/16/2021   GFRNONAA 62 08/20/2020   GFRAA 72 08/20/2020   NA 139 02/16/2021   K 4.5 02/16/2021   CALCIUM 9.4 02/16/2021   CO2 28 02/16/2021   GLUCOSE 108 (H) 02/16/2021    Lab Results  Component Value Date/Time   HGBA1C 7.0 (H) 02/16/2021 10:10 AM   HGBA1C 6.5 (H) 08/20/2020 11:44 AM   GFR 68.47 02/16/2021 10:10 AM   GFR 68.58 02/15/2020 08:50 AM   MICROALBUR <0.7 02/16/2021 10:10 AM   MICROALBUR 1.5 08/20/2020 11:44 AM    Last diabetic Eye exam: No results found for: HMDIABEYEEXA  Last diabetic Foot exam: No results found for: HMDIABFOOTEX   Lab Results  Component Value Date   CHOL 113 02/16/2021   HDL 34.50 (L) 02/16/2021   LDLCALC 57 02/16/2021   TRIG 109.0 02/16/2021   CHOLHDL 3 02/16/2021    Hepatic Function Latest Ref Rng & Units 02/16/2021 08/20/2020 02/15/2020  Total Protein 6.0 - 8.3 g/dL 6.7 6.5 7.0  Albumin 3.5 - 5.2 g/dL 4.1 - 4.1  AST 0 - 37 U/L _0 ALT 0 - 53 U/L _1 Alk Phosphatase 39 - 117 U/L 70 - 72  Total Bilirubin 0.2 - 1.2 mg/dL 0.4 0.4 0.4  Bilirubin,  Direct 0.00 - 0.40 mg/dL - - -    Lab Results  Component Value Date/Time   TSH 1.64 02/16/2021 10:10 AM   TSH 0.81 01/19/2019 09:41 AM    CBC Latest Ref Rng & Units 02/16/2021 08/20/2020 12/11/2018  WBC 4.0 - 10.5 K/uL 6.8 8.3 8.4  Hemoglobin 13.0 - 17.0 g/dL 14.2 15.0 12.3(L)  Hematocrit 39.0 - 52.0 % 41.9 46.7 38.9(L)  Platelets 150.0 - 400.0 K/uL 248.0 265 190    No results found for: VD25OH  Clinical ASCVD: No  The ASCVD Risk score Mikey Bussing DC Jr., et al., 2013) failed to calculate for the following reasons:   The valid total  cholesterol range is 130 to 320 mg/dL    Depression screen [facility] 2/9 03/27/2020 08/01/2019  Decreased Interest 0 0  Down, Depressed, Hopeless 0 0  PHQ - 2 Score 0 0  Some recent data might be hidden     Social History   Tobacco Use  Smoking Status Former Smoker  . Packs/day: 2.00  . Years: 2.00  . Pack years: 4.00  . Types: Cigarettes  . Quit date: 12/14/1963  . Years since quitting: 57.2  Smokeless Tobacco Never Used   BP Readings from Last 3 Encounters:  02/16/21 (!) 142/80  10/23/20 (!) 148/80  09/23/20 140/82   Pulse Readings from Last 3 Encounters:  02/16/21 84  10/23/20 (!) 59  09/23/20 68   Wt Readings from Last 3 Encounters:  02/16/21 194 lb 3.2 oz (88.1 kg)  10/23/20 193 lb (87.5 kg)  09/23/20 189 lb (85.7 kg)   BMI Readings from Last 3 Encounters:  02/16/21 28.27 kg/m  10/23/20 28.09 kg/m  09/23/20 27.51 kg/m    Assessment/Interventions: Review of patient past medical history, allergies, medications, health status, including review of consultants reports, laboratory and other test data, was performed as part of comprehensive evaluation and provision of chronic care management services.   SDOH:  (Social Determinants of Health) assessments and interventions performed: Yes SDOH Interventions   Flowsheet Row Most Recent Value  SDOH Interventions   Financial Strain Interventions Intervention Not Indicated     SDOH Screenings   Alcohol Screen: Not on file  Depression (PHQ2-9): Low Risk   . PHQ-2 Score: 0  Financial Resource Strain: Low Risk   . Difficulty of Paying Living Expenses: Not hard at all  Food Insecurity: Not on file  Housing: Not on file  Physical Activity: Not on file  Social Connections: Not on file  Stress: Not on file  Tobacco Use: Medium Risk  . Smoking Tobacco Use: Former Smoker  . Smokeless Tobacco Use: Never Used  Transportation Needs: Not on file    CCM Care Plan  Allergies  Allergen Reactions  . Contrast Media [Iodinated  Diagnostic Agents] Other (See Comments)    Redness and warm sensation, this reaction was noted on 02/27/13 during a Cardiac MRI per pt.  Pt sts he had erythema on his head, chest, and shoulders.  Pt had a pacemaker placed August 2019 and was pre-medicated for the dye and had no allergic reaction at that time. -Carissa Mozingo B.S. RT(R)(CT)    Medications Reviewed Today    Reviewed by Charlton Haws, [facility] (Pharmacist) on 03/06/21 at 1053  Med List Status: <None>  Medication Order Taking? Sig Documenting Provider Last Dose Status Informant  ACCU-CHEK AVIVA PLUS test strip [id number] Yes USE AS INSTRUCTED TO TEST BLOOD SUGAR 6 TIMES DAILY. DX CODE: K74.25 [name], MD Taking Active   ACCU-CHEK  SOFTCLIX LANCETS lancets [id number] Yes Use to test blood sugar daily as directed. Hendricks Limes, MD Taking Active Self  albuterol [facility] HFA) 108 [phone] Base) MCG/ACT inhaler [id number] Yes Inhale 2 puffs into the lungs every 6 (six) hours as needed for wheezing or shortness of breath. [name], MD Taking Active   albuterol (PROVENTIL) (2.5 MG/3ML) 0.083% nebulizer solution [id number] Yes TAKE 3 MLS BY NEBULIZATION EVERY 6 (SIX) HOURS AS NEEDED FOR WHEEZING OR SHORTNESS OF BREATH. [name], MD Taking Active   aspirin 81 MG tablet [id number] Yes Take 81 mg by mouth at bedtime. [provider] Taking Active Self           Med Note Juliette Alcide Oct 31, 2018  1:76 AM)    BYSTOLIC 5 MG tablet [id number] Yes TAKE 1 TABLET BY MOUTH  DAILY Belva Crome, MD Taking Active   celecoxib (CELEBREX) 100 MG capsule [id number] Yes TAKE 1 CAPSULE BY MOUTH EVERY DAY Lyndal Pulley, DO Taking Active   Continuous Blood Gluc Receiver (FREESTYLE LIBRE 14 DAY READER) DEVI [id number] Yes 1 Device by Does not apply route every 14 (fourteen) days. Caren Griffins, MD Taking Active   Continuous Blood Gluc Sensor (FREESTYLE LIBRE 14 DAY SENSOR) MISC [id number] Yes APPLY 1 SENSOR EVERY 14 DAYS Burns,  Claudina Lick, MD Taking Active   famotidine (PEPCID) 20 MG tablet [id number]  Take 1 tablet (20 mg total) by mouth daily. Lavina Hamman, MD  Expired 07/24/20 2359   fexofenadine (ALLEGRA) 180 MG tablet [id number] Yes Take 180 mg by mouth daily as needed for allergies.  [provider] Taking Active Self  gabapentin (NEURONTIN) 100 MG capsule [id number] Yes Take 2 capsules (200 mg total) by mouth at bedtime. Lyndal Pulley, DO Taking Active   HYDROcodone-acetaminophen [facility]) 7.5-325 MG tablet [id number] Yes Take 1 tablet by mouth every 6 (six) hours as needed for severe pain (for chronic joint pain, neck pain). [name], MD Taking Active   ipratropium (ATROVENT) 0.02 % nebulizer solution [id number] Yes Take 2.5 mLs (0.5 mg total) by nebulization every 6 (six) hours.  Patient taking differently: Take 0.5 mg by nebulization every 6 (six) hours as needed.   Lauraine Rinne, NP Taking Active   levalbuterol Penne Lash) 1.25 MG/3ML nebulizer solution [id number] Yes Take 1.25 mg by nebulization every 6 (six) hours.  Patient taking differently: Take 1.25 mg by nebulization every 6 (six) hours as needed.   Lauraine Rinne, NP Taking Active   LORazepam (ATIVAN) 1 MG tablet [id number] Yes TAKE 1/2 TO 1 TABLET BY MOUTH DAILY AS NEEDED. [name], MD Taking Active   metFORMIN (GLUCOPHAGE) 500 MG tablet [id number] Yes Take 1 tablet (500 mg total) by mouth 2 (two) times daily with a meal. Burns, Claudina Lick, MD Taking Active   montelukast (SINGULAIR) 10 MG tablet [id number] Yes TAKE 1 TABLET BY MOUTH AT  BEDTIME [name], MD Taking Active   Multiple Vitamin (MULTIVITAMIN WITH MINERALS) TABS tablet [id number] Yes Take 1 tablet by mouth daily. [provider] Taking Active Self  nitroGLYCERIN (NITROSTAT) 0.4 MG SL tablet [id number] Yes PLACE 1 TABLET (0.4 MG TOTAL) UNDER THE TONGUE EVERY 5 (FIVE) MINUTES X 3 DOSES AS NEEDED FOR CHEST PAIN. Belva Crome, MD Taking Active   Respiratory Therapy Supplies  (FLUTTER) DEVI [id number] Yes Twice a day and prn as needed, may increase if feeling worse Lauraine Rinne, NP Taking Active  rosuvastatin (CRESTOR) 10 MG tablet [id number] Yes TAKE ONE TABLET BY MOUTH DAILY ON MONDAY, [facility] AND FRIDAY Belva Crome, MD Taking Active   SYMBICORT 160-4.5 MCG/ACT inhaler [id number] Yes USE 1 TO 2 INHALATIONS BY  MOUTH EVERY 12 HOURS AS  NEEDED [name], MD Taking Active   tiZANidine (ZANAFLEX) 4 MG tablet [id number] No TAKE 1 TABLET BY MOUTH AT BEDTIME.  Patient not taking: Reported on 03/06/2021   Lyndal Pulley, DO Not Taking Active   vardenafil (LEVITRA) 20 MG tablet [id number] Yes TAKE 1 TABLET BY MOUTH AS DIRECTED. CAN NOT BE TAKEN WITH NITROGLYCERIN. NOT COVERED BY INSURANCE Burns, Claudina Lick, MD Taking Active   zinc gluconate 50 MG tablet [id number] Yes Take 50 mg by mouth daily. [provider] Taking Active           Patient Active Problem List   Diagnosis Date Noted  . Aortic atherosclerosis ([place]) 02/15/2021  . Degenerative disc disease, cervical 09/23/2020  . Nonallopathic lesion of cervical region 09/23/2020  . Nonallopathic lesion of thoracic region 09/23/2020  . Pain medication agreement signed, 02/16/2021 02/15/2020  . Peroneal mononeuropathy, left 12/25/2018  . Diabetic polyneuropathy associated with type 2 diabetes mellitus ([place]) 12/25/2018  . Cylindrical bronchiectasis ([place]) 12/20/2018  . Viral pneumonia 12/12/2018  . Community acquired pneumonia 11/18/2018  . Encounter for chronic pain management 04/13/2017  . Osteoarthritis 04/12/2017  . Chronic combined systolic and diastolic CHF (congestive heart failure) ([place]) 01/24/2015  . Left bundle branch block 11/27/2013  . Essential hypertension 11/27/2013  . GERD (gastroesophageal reflux disease) 02/27/2013  . Type 2 diabetes mellitus with vascular disease ([place]) 04/09/2009  . HLD (hyperlipidemia) 09/06/2008  . Coronary atherosclerosis 09/06/2008  . Asthma 09/06/2008     Immunization History  Administered Date(s) Administered  . Fluad Quad(high Dose 65+) 08/01/2019  . Influenza Split 08/30/2012  . Influenza, High Dose Seasonal PF 08/31/2017, 08/08/2020  . Influenza,inj,Quad PF,6+ Mos 10/09/2013, 09/12/2015  . Influenza,trivalent, recombinat, inj, PF 08/21/2018  . Influenza-Unspecified 09/29/2014, 09/02/2017, 08/08/2020  . Moderna Sars-Covid-2 Vaccination 01/25/2020, 03/03/2020  . Pneumococcal Conjugate-13 03/04/2016  . Pneumococcal Polysaccharide-23 04/12/2017  . Td 05/13/2008  . Tdap 07/02/2020    Conditions to be addressed/monitored:  Hypertension, Hyperlipidemia, Diabetes, Heart Failure, Coronary Artery Disease, GERD, Asthma and Chronic pain  Care Plan : CCM Pharmacy Care Plan  Updates made by Charlton Haws, [place] since 03/09/2021 12:00 AM    Problem: Hypertension, Hyperlipidemia, Diabetes, Heart Failure, Coronary Artery Disease, GERD, Asthma and Chronic pain   Priority: High    Long-Range Goal: Disease management   Start Date: 03/09/2021  Expected End Date: 03/09/2022  This Visit's Progress: On track  Priority: High  Note:   Current Barriers:  . Unable to independently monitor therapeutic efficacy . Unable to maintain control of BP  Pharmacist Clinical Goal(s):  [name] Patient will achieve adherence to monitoring guidelines and medication adherence to achieve therapeutic efficacy . maintain control of BP as evidenced by improved home readings  through collaboration with PharmD and provider.   Interventions: . 1:1 collaboration with [name], MD regarding development and update of comprehensive plan of care as evidenced by provider attestation and co-signature . Inter-disciplinary care team collaboration (see longitudinal plan of care) . Comprehensive medication review performed; medication list updated in electronic medical record  Hyperlipidemia: (LDL goal < 70) -Controlled - LDL is at goal, pt denies side effects. He is not  taking aspirin often. -hx coronary atherosclerosis -Current treatment: . Rosuvastatin 10 mg  MWF . Aspirin 81 mg daily . Nitroglycerin 0.4 mg SL prn -took 2-3 in past year -Educated on Cholesterol goals;  Benefits of statin for ASCVD risk reduction;  -Counseled on benefits of aspirin given history of CAD -Recommended to continue current medication and take aspirin daily  Diabetes (A1c goal <7%) -Controlled - pt reports improved glucose readings since increasing metformin to BID -Current medications: [name] Metformin 500 mg BID . Freestyle Elenor Legato (pays OOP) . Testing supplies -Current home glucose readings: 130-200  -Denies hypoglycemic/hyperglycemic symptoms -Educated on A1c and blood sugar goals; Continuous glucose monitoring;  Effect of stress and pain on blood sugar -Counseled to check feet daily and get yearly eye exams -Recommended to continue current medication  Heart Failure / HTN (Goal: BP goal <130/80, prevent exacerbations) -Not ideally controlled - pt-reported home BP have been elevated although he is not consistently monitoring  -hx heart block w/ pacemaker in place -Last ejection fraction: 40-45% (Date: 06/23/2018) -HF type: Combined Systolic and Diastolic -Current treatment: . Bystolic 5 mg -Medications previously tried: lisinopril/HCTZ  -Current home BP/HR readings: 146/82, HR 60s -Educated on Benefits of medications for managing symptoms and prolonging life Importance of blood pressure control -Recommended to continue current medication Recommended to monitor BP at home daily and contact prescriber if consistently > 140/80  Asthma (Goal: control symptoms and prevent exacerbations) -Controlled - pt reports issues started late 2019 after respiratory infection, now he is doing much better on Symbicort; rarely uses nebulizers or rescue inhaler -Current treatment  . Symbicort 160-4.5 mcg/act 1-2 puffs BID . Albuterol HFA prn . Albuterol 0.083% nebulization - not  using . Ipratropium 0.02% nebulization - not using . Levalbuterol 1.25 mg nebulization  -not using . Montelukast 10 mg HS -Pulmonary function testing: not on file -Exacerbations requiring treatment in last 6 months: none -Patient reports consistent use of maintenance inhaler -Frequency of rescue inhaler use: infrequent -Counseled on Proper inhaler technique; Benefits of consistent maintenance inhaler use -Recommended to continue current medication  Anxiety / Insomnia (Goal: manage symptoms ) -Controlled - pt takes lorazepam mainly for sleep and reports it is working very well; he denies extra doses -Current treatment: . Lorazepam 1 mg HS -PHQ9: 0 -GAD7: not on file -Connected with PCP for mental health support -Educated on Benefits of medication for symptom control; potential side effects and risks associated with long term benzodiazepine use -Recommended to continue current medication as needed  GERD (Goal: manage symptoms) -Controlled - per patient report -Current treatment  . Famotidine 20 mg daily AM -Medications previously tried:  -Recommended to continue current medication  Pain (Goal: manage symptoms ) -Not ideally controlled - uses meditation for pain at night  -keeps narcotics in a safe (daughter has hx of narcotic abuse and grandchildren live in the house)  -osteoarthritis, polyneuropathy, cervical DDD (seen sports med and neurosurgery) -Current treatment  . Gabapentin 100 mg - 2 cap HS (not taking) . Tizanidine 4 mg HS (not taking) . Celecoxib 100 mg daily . Hydrocodone-APAP 7.5-325 mg q6h PRN (#90/month) - usually TID (AM, afternoon, bedtime)  -Counseled on benefits of gabapentin and opioid-sparing properties -Recommended to restart gabapentin for nerve pain/radiculopathy  -Recommend to continue current medication  Health Maintenance -Vaccine gaps: covid booster -Current therapy:  . Multivitamin . Zinc 50 mg daily . Fexofenadine 180 mg daily prn -Patient is  satisfied with current therapy and denies issues -Recommended to continue current medication  Patient Goals/Self-Care Activities . Patient will:  - take medications as prescribed focus on medication  adherence by pill box check glucose via CGM, document, and provide at future appointments check blood pressure daily, document, and provide at future appointments  Follow Up Plan: Telephone follow up appointment with care management team member scheduled for: 6 months      Medication Assistance: None required.  Patient affirms current coverage meets needs.  Patient's preferred pharmacy is:  [facility], [address], [address], [place] [address] Phone: [phone] Fax: [phone]  [facility], [address] [place] [address] Phone: [phone] Fax: [phone]  Uses pill box? Yes Pt endorses 100% compliance  We discussed: Current pharmacy is preferred with insurance plan and patient is satisfied with pharmacy services Patient decided to: Continue current medication management strategy  Care Plan and Follow Up Patient Decision:  Patient agrees to Care Plan and Follow-up.  Plan: Telephone follow up appointment with care management team member scheduled for:  6 months  Charlene Brooke, PharmD, [place], CPP Clinical Pharmacist [facility] [phone]

## 2021-03-05 NOTE — Progress Notes (Signed)
Chronic Care Management Pharmacy Assistant   Name: Jeffery [name]  MRN: [id number] DOB: [birth year]   Reason for Encounter: Initial Questions    Recent office visits:  02/16/21 Dr. Quay Burow, Internal Medicine  Recent consult visits:  None ID  Hospital visits:  None in previous 6 months  Medications: Outpatient Encounter Medications as of 03/05/2021  Medication Sig  . ACCU-CHEK AVIVA PLUS test strip USE AS INSTRUCTED TO TEST BLOOD SUGAR 6 TIMES DAILY. DX CODE: E11.59  . ACCU-CHEK SOFTCLIX LANCETS lancets Use to test blood sugar daily as directed.  [name] albuterol (PROAIR HFA) 108 (90 Base) MCG/ACT inhaler Inhale 2 puffs into the lungs every 6 (six) hours as needed for wheezing or shortness of breath.  [name] albuterol (PROVENTIL) (2.5 MG/3ML) 0.083% nebulizer solution TAKE 3 MLS BY NEBULIZATION EVERY 6 (SIX) HOURS AS NEEDED FOR WHEEZING OR SHORTNESS OF BREATH.  [name] aspirin 81 MG tablet Take 81 mg by mouth at bedtime.  [name] BYSTOLIC 5 MG tablet TAKE 1 TABLET BY MOUTH  DAILY  . celecoxib (CELEBREX) 100 MG capsule TAKE 1 CAPSULE BY MOUTH EVERY DAY  . Continuous Blood Gluc Receiver (FREESTYLE LIBRE 14 DAY READER) DEVI 1 Device by Does not apply route every 14 (fourteen) days.  . Continuous Blood Gluc Sensor (FREESTYLE LIBRE 14 DAY SENSOR) MISC APPLY 1 SENSOR EVERY 14 DAYS  . famotidine (PEPCID) 20 MG tablet Take 1 tablet (20 mg total) by mouth daily.  . fexofenadine (ALLEGRA) 180 MG tablet Take 180 mg by mouth daily as needed for allergies.   [name] gabapentin (NEURONTIN) 100 MG capsule Take 2 capsules (200 mg total) by mouth at bedtime.  [name] HYDROcodone-acetaminophen (NORCO) 7.5-325 MG tablet Take 1 tablet by mouth every 6 (six) hours as needed for severe pain (for chronic joint pain, neck pain).  [name] ipratropium (ATROVENT) 0.02 % nebulizer solution Take 2.5 mLs (0.5 mg total) by nebulization every 6 (six) hours.  [name] levalbuterol (XOPENEX) 1.25 MG/3ML nebulizer solution Take 1.25 mg by nebulization every 6  (six) hours.  [name] LORazepam (ATIVAN) 1 MG tablet TAKE 1/2 TO 1 TABLET BY MOUTH DAILY AS NEEDED.  . metFORMIN (GLUCOPHAGE) 500 MG tablet Take 1 tablet (500 mg total) by mouth 2 (two) times daily with a meal.  . montelukast (SINGULAIR) 10 MG tablet TAKE 1 TABLET BY MOUTH AT  BEDTIME  . Multiple Vitamin (MULTIVITAMIN WITH MINERALS) TABS tablet Take 1 tablet by mouth daily.  . nitroGLYCERIN (NITROSTAT) 0.4 MG SL tablet PLACE 1 TABLET (0.4 MG TOTAL) UNDER THE TONGUE EVERY 5 (FIVE) MINUTES X 3 DOSES AS NEEDED FOR CHEST PAIN.  [name] Respiratory Therapy Supplies (FLUTTER) DEVI Twice a day and prn as needed, may increase if feeling worse  . rosuvastatin (CRESTOR) 10 MG tablet TAKE ONE TABLET BY MOUTH DAILY ON MONDAY, WEDNESDAY AND FRIDAY  . SYMBICORT 160-4.5 MCG/ACT inhaler USE 1 TO 2 INHALATIONS BY  MOUTH EVERY 12 HOURS AS  NEEDED  . tiZANidine (ZANAFLEX) 4 MG tablet TAKE 1 TABLET BY MOUTH AT BEDTIME.  . vardenafil (LEVITRA) 20 MG tablet TAKE 1 TABLET BY MOUTH AS DIRECTED. CAN NOT BE TAKEN WITH NITROGLYCERIN. NOT COVERED BY INSURANCE  . zinc gluconate 50 MG tablet Take 50 mg by mouth daily.   No facility-administered encounter medications on file as of 03/05/2021.    Care Gaps: Overdue Ophthalmology exam  Star Rating Drugs: No ACE/ARB   Have you seen any other providers since your last visit?  The patient states that he has not  seen any other provider since Dr. Quay Burow  Any changes in your medications or health?  The patient states that he has not had any changes changes in his medications or health  Any side effects from any medications?  The patient states that he does not have any side effects from medications  Do you have an symptoms or problems not managed by your medications? The patient states that he does not have any symptoms or problems not managed by medications  Any concerns about your health right now?  The patient states that he does not have any concerns about his health at this  time  Has your provider asked that you check blood pressure, blood sugar, or follow special diet at home?  The patient states that he does not take blood pressure, blood sugar, or diet daily  Do you get any type of exercise on a regular basis?The patient states that he gets his exercise from doing housework and watching grandchildren  Can you think of a goal you would like to reach for your health?  The patient would like t maintain his health  Do you have any problems getting your medications?  The patient does not have any problem with getting medication or the cost of medication from the pharmacy  Is there anything that you would like to discuss during the appointment?  The patient states that he does not have anything to discuss at this time  Patient to receive a phone call for visit, asked to have medications and supplements at appointment   Wendy Poet, [place] [phone]  Time spent:30 minutes- telephone call, reviewing chart notes, and documentation

## 2021-03-06 ENCOUNTER — Ambulatory Visit (INDEPENDENT_AMBULATORY_CARE_PROVIDER_SITE_OTHER): Payer: Medicare Other | Admitting: Pharmacist

## 2021-03-06 ENCOUNTER — Other Ambulatory Visit: Payer: Self-pay

## 2021-03-06 DIAGNOSIS — E782 Mixed hyperlipidemia: Secondary | ICD-10-CM

## 2021-03-06 DIAGNOSIS — I25118 Atherosclerotic heart disease of native coronary artery with other forms of angina pectoris: Secondary | ICD-10-CM | POA: Diagnosis not present

## 2021-03-06 DIAGNOSIS — F419 Anxiety disorder, unspecified: Secondary | ICD-10-CM

## 2021-03-06 DIAGNOSIS — J45909 Unspecified asthma, uncomplicated: Secondary | ICD-10-CM | POA: Diagnosis not present

## 2021-03-06 DIAGNOSIS — I10 Essential (primary) hypertension: Secondary | ICD-10-CM

## 2021-03-06 DIAGNOSIS — M542 Cervicalgia: Secondary | ICD-10-CM

## 2021-03-06 DIAGNOSIS — I5042 Chronic combined systolic (congestive) and diastolic (congestive) heart failure: Secondary | ICD-10-CM | POA: Diagnosis not present

## 2021-03-06 DIAGNOSIS — E1159 Type 2 diabetes mellitus with other circulatory complications: Secondary | ICD-10-CM

## 2021-03-09 NOTE — Patient Instructions (Signed)
Visit Information  Phone number for Pharmacist: [phone]  Thank you for meeting with me to discuss your medications! I look forward to working with you to achieve your health care goals. Below is a summary of what we talked about during the visit:  Goals Addressed            This Visit's Progress   . Manage My Medicine       Timeframe:  Long-Range Goal Priority:  Medium Start Date:        03/09/21                     Expected End Date:    09/11/21                   Follow Up Date 04/11/21   - call for medicine refill 2 or 3 days before it runs out - call if I am sick and can't take my medicine - keep a list of all the medicines I take; vitamins and herbals too - use a pillbox to sort medicine    Why is this important?   . These steps will help you keep on track with your medicines.   Notes:       Patient Care Plan: CCM Pharmacy Care Plan    Problem Identified: Hypertension, Hyperlipidemia, Diabetes, Heart Failure, Coronary Artery Disease, GERD, Asthma and Chronic pain   Priority: High    Long-Range Goal: Disease management   Start Date: 03/09/2021  Expected End Date: 03/09/2022  This Visit's Progress: On track  Priority: High  Note:   Current Barriers:  . Unable to independently monitor therapeutic efficacy . Unable to maintain control of BP  Pharmacist Clinical Goal(s):  [name] Patient will achieve adherence to monitoring guidelines and medication adherence to achieve therapeutic efficacy . maintain control of BP as evidenced by improved home readings  through collaboration with PharmD and provider.   Interventions: . 1:1 collaboration with [name], MD regarding development and update of comprehensive plan of care as evidenced by provider attestation and co-signature . Inter-disciplinary care team collaboration (see longitudinal plan of care) . Comprehensive medication review performed; medication list updated in electronic medical record  Hyperlipidemia:  (LDL goal < 70) -Controlled - LDL is at goal, pt denies side effects. He is not taking aspirin often. -hx coronary atherosclerosis -Current treatment: . Rosuvastatin 10 mg MWF . Aspirin 81 mg daily . Nitroglycerin 0.4 mg SL prn -took 2-3 in past year -Educated on Cholesterol goals;  Benefits of statin for ASCVD risk reduction;  -Counseled on benefits of aspirin given history of CAD -Recommended to continue current medication and take aspirin daily  Diabetes (A1c goal <7%) -Controlled - pt reports improved glucose readings since increasing metformin to BID -Current medications: [name] Metformin 500 mg BID . Freestyle Elenor Legato (pays OOP) . Testing supplies -Current home glucose readings: 130-200  -Denies hypoglycemic/hyperglycemic symptoms -Educated on A1c and blood sugar goals; Continuous glucose monitoring;  Effect of stress and pain on blood sugar -Counseled to check feet daily and get yearly eye exams -Recommended to continue current medication  Heart Failure / HTN (Goal: BP goal <130/80, prevent exacerbations) -Not ideally controlled - pt-reported home BP have been elevated although he is not consistently monitoring  -hx heart block w/ pacemaker in place -Last ejection fraction: 40-45% (Date: 06/23/2018) -HF type: Combined Systolic and Diastolic -Current treatment: . Bystolic 5 mg -Medications previously tried: lisinopril/HCTZ  -Current home BP/HR readings: 146/82, HR 60s -Educated  on Benefits of medications for managing symptoms and prolonging life Importance of blood pressure control -Recommended to continue current medication Recommended to monitor BP at home daily and contact prescriber if consistently > 140/80  Asthma (Goal: control symptoms and prevent exacerbations) -Controlled - pt reports issues started late 2019 after respiratory infection, now he is doing much better on Symbicort; rarely uses nebulizers or rescue inhaler -Current treatment  . Symbicort 160-4.5 mcg/act  1-2 puffs BID . Albuterol HFA prn . Albuterol 0.083% nebulization - not using . Ipratropium 0.02% nebulization - not using . Levalbuterol 1.25 mg nebulization  -not using . Montelukast 10 mg HS -Pulmonary function testing: not on file -Exacerbations requiring treatment in last 6 months: none -Patient reports consistent use of maintenance inhaler -Frequency of rescue inhaler use: infrequent -Counseled on Proper inhaler technique; Benefits of consistent maintenance inhaler use -Recommended to continue current medication  Anxiety / Insomnia (Goal: manage symptoms ) -Controlled - pt takes lorazepam mainly for sleep and reports it is working very well; he denies extra doses -Current treatment: . Lorazepam 1 mg HS -PHQ9: 0 -GAD7: not on file -Connected with PCP for mental health support -Educated on Benefits of medication for symptom control; potential side effects and risks associated with long term benzodiazepine use -Recommended to continue current medication as needed  GERD (Goal: manage symptoms) -Controlled - per patient report -Current treatment  . Famotidine 20 mg daily AM -Medications previously tried:  -Recommended to continue current medication  Pain (Goal: manage symptoms ) -Not ideally controlled - uses meditation for pain at night  -keeps narcotics in a safe (daughter has hx of narcotic abuse and grandchildren live in the house)  -osteoarthritis, polyneuropathy, cervical DDD (seen sports med and neurosurgery) -Current treatment  . Gabapentin 100 mg - 2 cap HS (not taking) . Tizanidine 4 mg HS (not taking) . Celecoxib 100 mg daily . Hydrocodone-APAP 7.5-325 mg q6h PRN (#90/month) - usually TID (AM, afternoon, bedtime)  -Counseled on benefits of gabapentin and opioid-sparing properties -Recommended to restart gabapentin for nerve pain/radiculopathy  -Recommend to continue current medication  Health Maintenance -Vaccine gaps: covid booster -Current therapy:   . Multivitamin . Zinc 50 mg daily . Fexofenadine 180 mg daily prn -Patient is satisfied with current therapy and denies issues -Recommended to continue current medication  Patient Goals/Self-Care Activities . Patient will:  - take medications as prescribed focus on medication adherence by pill box check glucose via CGM, document, and provide at future appointments check blood pressure daily, document, and provide at future appointments  Follow Up Plan: Telephone follow up appointment with care management team member scheduled for: 6 months      Mr. Krise was given information about Chronic Care Management services today including:  1. CCM service includes personalized support from designated clinical staff supervised by his physician, including individualized plan of care and coordination with other care providers 2. 24/7 contact phone numbers for assistance for urgent and routine care needs. 3. Standard insurance, coinsurance, copays and deductibles apply for chronic care management only during months in which we provide at least 20 minutes of these services. Most insurances cover these services at 100%, however patients may be responsible for any copay, coinsurance and/or deductible if applicable. This service may help you avoid the need for more expensive face-to-face services. 4. Only one practitioner may furnish and bill the service in a calendar month. 5. The patient may stop CCM services at any time (effective at the end of the month) by phone  call to the office staff.  Patient agreed to services and verbal consent obtained.   Patient verbalizes understanding of instructions provided today and agrees to view in [place].  Telephone follow up appointment with pharmacy team member scheduled for: 6 months  Charlene Brooke, PharmD, [facility] Clinical Pharmacist [facility] at [facility] [phone]

## 2021-03-15 ENCOUNTER — Other Ambulatory Visit: Payer: Self-pay | Admitting: Family Medicine

## 2021-04-06 ENCOUNTER — Other Ambulatory Visit: Payer: Self-pay | Admitting: Internal Medicine

## 2021-04-06 MED ORDER — HYDROCODONE-ACETAMINOPHEN 7.5-325 MG PO TABS: 1.0000 | ORAL_TABLET | Freq: Four times a day (QID) | ORAL | 0 refills | Status: DC | PRN

## 2021-04-17 ENCOUNTER — Telehealth: Payer: Self-pay | Admitting: Pharmacist

## 2021-04-17 NOTE — Progress Notes (Signed)
Chronic Care Management Pharmacy Assistant   Name: Jeffery [name]  MRN: [id number] DOB: [birth year]  Reason for Encounter: Disease State - Hypertension   Recent office visits:  None noted  Recent consult visits:  None noted  Hospital visits:  None in previous 6 months  Medications: Outpatient Encounter Medications as of 04/17/2021  Medication Sig  . HYDROcodone-acetaminophen (NORCO) 7.5-325 MG tablet Take 1 tablet by mouth every 6 (six) hours as needed for severe pain (for chronic joint pain, neck pain).  [name] ACCU-CHEK AVIVA PLUS test strip USE AS INSTRUCTED TO TEST BLOOD SUGAR 6 TIMES DAILY. DX CODE: E11.59  . ACCU-CHEK SOFTCLIX LANCETS lancets Use to test blood sugar daily as directed.  [name] albuterol (PROAIR HFA) 108 (90 Base) MCG/ACT inhaler Inhale 2 puffs into the lungs every 6 (six) hours as needed for wheezing or shortness of breath.  [name] albuterol (PROVENTIL) (2.5 MG/3ML) 0.083% nebulizer solution TAKE 3 MLS BY NEBULIZATION EVERY 6 (SIX) HOURS AS NEEDED FOR WHEEZING OR SHORTNESS OF BREATH.  [name] aspirin 81 MG tablet Take 81 mg by mouth at bedtime.  [name] BYSTOLIC 5 MG tablet TAKE 1 TABLET BY MOUTH  DAILY  . celecoxib (CELEBREX) 100 MG capsule TAKE 1 CAPSULE BY MOUTH EVERY DAY  . Continuous Blood Gluc Receiver (FREESTYLE LIBRE 14 DAY READER) DEVI 1 Device by Does not apply route every 14 (fourteen) days.  . Continuous Blood Gluc Sensor (FREESTYLE LIBRE 14 DAY SENSOR) MISC APPLY 1 SENSOR EVERY 14 DAYS  . famotidine (PEPCID) 20 MG tablet Take 1 tablet (20 mg total) by mouth daily.  . fexofenadine (ALLEGRA) 180 MG tablet Take 180 mg by mouth daily as needed for allergies.   [name] gabapentin (NEURONTIN) 100 MG capsule Take 2 capsules (200 mg total) by mouth at bedtime.  [name] ipratropium (ATROVENT) 0.02 % nebulizer solution Take 2.5 mLs (0.5 mg total) by nebulization every 6 (six) hours. (Patient taking differently: Take 0.5 mg by nebulization every 6 (six) hours as needed.)  . levalbuterol  (XOPENEX) 1.25 MG/3ML nebulizer solution Take 1.25 mg by nebulization every 6 (six) hours. (Patient taking differently: Take 1.25 mg by nebulization every 6 (six) hours as needed.)  . LORazepam (ATIVAN) 1 MG tablet TAKE 1/2 TO 1 TABLET BY MOUTH DAILY AS NEEDED.  . metFORMIN (GLUCOPHAGE) 500 MG tablet Take 1 tablet (500 mg total) by mouth 2 (two) times daily with a meal.  . montelukast (SINGULAIR) 10 MG tablet TAKE 1 TABLET BY MOUTH AT  BEDTIME  . Multiple Vitamin (MULTIVITAMIN WITH MINERALS) TABS tablet Take 1 tablet by mouth daily.  . nitroGLYCERIN (NITROSTAT) 0.4 MG SL tablet PLACE 1 TABLET (0.4 MG TOTAL) UNDER THE TONGUE EVERY 5 (FIVE) MINUTES X 3 DOSES AS NEEDED FOR CHEST PAIN.  [name] Respiratory Therapy Supplies (FLUTTER) DEVI Twice a day and prn as needed, may increase if feeling worse  . rosuvastatin (CRESTOR) 10 MG tablet TAKE ONE TABLET BY MOUTH DAILY ON MONDAY, WEDNESDAY AND FRIDAY  . SYMBICORT 160-4.5 MCG/ACT inhaler USE 1 TO 2 INHALATIONS BY  MOUTH EVERY 12 HOURS AS  NEEDED  . tiZANidine (ZANAFLEX) 4 MG tablet TAKE 1 TABLET BY MOUTH AT BEDTIME. (Patient not taking: Reported on 03/06/2021)  . vardenafil (LEVITRA) 20 MG tablet TAKE 1 TABLET BY MOUTH AS DIRECTED. CAN NOT BE TAKEN WITH NITROGLYCERIN. NOT COVERED BY INSURANCE  . zinc gluconate 50 MG tablet Take 50 mg by mouth daily.   No facility-administered encounter medications on file as of 04/17/2021.  Reviewed chart prior to disease state call. Spoke with patient regarding BP  Recent Office Vitals: BP Readings from Last 3 Encounters:  02/16/21 (!) 142/80  10/23/20 (!) 148/80  09/23/20 140/82   Pulse Readings from Last 3 Encounters:  02/16/21 84  10/23/20 (!) 59  09/23/20 68    Wt Readings from Last 3 Encounters:  02/16/21 194 lb 3.2 oz (88.1 kg)  10/23/20 193 lb (87.5 kg)  09/23/20 189 lb (85.7 kg)     Kidney Function Lab Results  Component Value Date/Time   CREATININE 1.05 02/16/2021 10:10 AM   CREATININE 1.13  08/20/2020 11:44 AM   CREATININE 1.05 02/15/2020 08:50 AM   GFR 68.47 02/16/2021 10:10 AM   GFRNONAA 62 08/20/2020 11:44 AM   GFRAA 72 08/20/2020 11:44 AM    BMP Latest Ref Rng & Units 02/16/2021 08/20/2020 02/15/2020  Glucose 70 - 99 mg/dL 108(H) 137(H) 122(H)  BUN 6 - 23 mg/dL 21 22 18   Creatinine 0.40 - 1.50 mg/dL 1.05 1.13 1.05  BUN/Creat Ratio 6 - 22 (calc) - NOT APPLICABLE -  Sodium 119 - 145 mEq/L 139 140 140  Potassium 3.5 - 5.1 mEq/L 4.5 5.5(H) 4.3  Chloride 96 - 112 mEq/L 103 103 104  CO2 19 - 32 mEq/L 28 30 30   Calcium 8.4 - 10.5 mg/dL 9.4 9.6 9.8    . Current antihypertensive regimen:   Bystolic 5 mg - 1 tab daily  . How often are you checking your Blood Pressure? 1-2x per week   . Current home BP readings:    Patient states his last BP was 130/70, he currently does not  record his readings.  . What recent interventions/DTPs have been made by any provider to improve Blood Pressure control since last CPP Visit:  o None noted  . Any recent hospitalizations or ED visits since last visit with CPP? No   . What diet changes have been made to improve Blood Pressure Control?   Patient states he has been watching what he eat and is  losing weight slowly.   . What exercise is being done to improve your Blood Pressure Control?  Patient states he is very active, he open and close the pool, work on the house, does yard work and babysit the [name].   Adherence Review: Is the patient currently on ACE/ARB medication? Yes Does the patient have >5 day gap between last estimated fill dates? No Bystolic 5 mg - 03/30/39 81K   Star Rating Drugs: Rosuvastatin - last fill 12/10/20 90D  Patient states he has some on hand he only takes them 3x a week.   Orinda Kenner, [place] Clinical Pharmacists Assistant [phone]  Time Spent: [phone]

## 2021-04-23 ENCOUNTER — Other Ambulatory Visit: Payer: Self-pay | Admitting: Internal Medicine

## 2021-04-26 ENCOUNTER — Other Ambulatory Visit: Payer: Self-pay | Admitting: Interventional Cardiology

## 2021-04-27 MED ORDER — NEBIVOLOL HCL 5 MG PO TABS: 5.0000 mg | ORAL_TABLET | Freq: Every day | ORAL | 0 refills | Status: DC

## 2021-05-01 ENCOUNTER — Ambulatory Visit (INDEPENDENT_AMBULATORY_CARE_PROVIDER_SITE_OTHER): Payer: Medicare Other

## 2021-05-01 DIAGNOSIS — I441 Atrioventricular block, second degree: Secondary | ICD-10-CM | POA: Diagnosis not present

## 2021-05-01 DIAGNOSIS — I428 Other cardiomyopathies: Secondary | ICD-10-CM

## 2021-05-01 LAB — CUP PACEART REMOTE DEVICE CHECK
Battery Remaining Longevity: 94 mo
Battery Remaining Percentage: 95.5 %
Battery Voltage: 2.98 V
Brady Statistic AP VP Percent: 30 %
Brady Statistic AP VS Percent: 1 %
Brady Statistic AS VP Percent: 69 %
Brady Statistic AS VS Percent: 1 %
Brady Statistic RA Percent Paced: 30 %
Date Time Interrogation Session: 20220520024300
Implantable Lead Implant Date: 20190813
Implantable Lead Implant Date: 20190813
Implantable Lead Implant Date: 20190813
Implantable Lead Location: 753858
Implantable Lead Location: 753859
Implantable Lead Location: 753860
Implantable Pulse Generator Implant Date: 20190813
Lead Channel Impedance Value: 450 Ohm
Lead Channel Impedance Value: 450 Ohm
Lead Channel Impedance Value: 750 Ohm
Lead Channel Pacing Threshold Amplitude: 0.5 V
Lead Channel Pacing Threshold Amplitude: 0.625 V
Lead Channel Pacing Threshold Amplitude: 1.25 V
Lead Channel Pacing Threshold Pulse Width: 0.5 ms
Lead Channel Pacing Threshold Pulse Width: 0.5 ms
Lead Channel Pacing Threshold Pulse Width: 0.5 ms
Lead Channel Sensing Intrinsic Amplitude: 12 mV
Lead Channel Sensing Intrinsic Amplitude: 3 mV
Lead Channel Setting Pacing Amplitude: 1.625
Lead Channel Setting Pacing Amplitude: 2.25 V
Lead Channel Setting Pacing Amplitude: 2.5 V
Lead Channel Setting Pacing Pulse Width: 0.5 ms
Lead Channel Setting Pacing Pulse Width: 0.5 ms
Lead Channel Setting Sensing Sensitivity: 5 mV
Pulse Gen Model: 3562
Pulse Gen Serial Number: 9043864

## 2021-05-10 ENCOUNTER — Other Ambulatory Visit: Payer: Self-pay | Admitting: Internal Medicine

## 2021-05-13 MED ORDER — HYDROCODONE-ACETAMINOPHEN 7.5-325 MG PO TABS: 1.0000 | ORAL_TABLET | Freq: Four times a day (QID) | ORAL | 0 refills | Status: DC | PRN

## 2021-05-14 ENCOUNTER — Telehealth: Payer: Self-pay

## 2021-05-14 ENCOUNTER — Encounter: Payer: Self-pay | Admitting: Internal Medicine

## 2021-05-14 ENCOUNTER — Other Ambulatory Visit: Payer: Self-pay

## 2021-05-14 ENCOUNTER — Ambulatory Visit (INDEPENDENT_AMBULATORY_CARE_PROVIDER_SITE_OTHER): Payer: Medicare Other

## 2021-05-14 VITALS — BP 118/70 | HR 70 | Temp 98.1°F | Ht 70.0 in | Wt 194.0 lb

## 2021-05-14 DIAGNOSIS — Z0000 Encounter for general adult medical examination without abnormal findings: Secondary | ICD-10-CM | POA: Diagnosis not present

## 2021-05-14 MED ORDER — METFORMIN HCL 500 MG PO TABS
500.0000 mg | ORAL_TABLET | Freq: Two times a day (BID) | ORAL | 0 refills | Status: DC
Start: 2021-05-14 — End: 2021-05-19

## 2021-05-14 NOTE — Patient Instructions (Signed)
Jeffery [name] , Thank you for taking time to come for your Medicare Wellness Visit. I appreciate your ongoing commitment to your health goals. Please review the following plan we discussed and let me know if I can assist you in the future.   Screening recommendations/referrals: Colonoscopy: 05/06/2016; due every 5-10  years Recommended yearly ophthalmology/optometry visit for glaucoma screening and checkup Recommended yearly dental visit for hygiene and checkup  Vaccinations: Influenza vaccine: 08/08/2020 Pneumococcal vaccine: 03/04/2016, 04/12/2017 Tdap vaccine: 07/02/2020 Shingles vaccine: never done Covid-19: 01/25/2020, 03/03/2020  Advanced directives: Please bring a copy of your health care power of attorney and living will to the office at your convenience.  Conditions/risks identified: Yes; Reviewed health maintenance screenings with patient today and relevant education, vaccines, and/or referrals were provided. Please continue to do your personal lifestyle choices by: daily care of teeth and gums, regular physical activity (goal should be 5 days a week for 30 minutes), eat a healthy diet, avoid tobacco and drug use, limiting any alcohol intake, taking a low-dose aspirin (if not allergic or have been advised by your provider otherwise) and taking vitamins and minerals as recommended by your provider. Continue doing brain stimulating activities (puzzles, reading, adult coloring books, staying active) to keep memory sharp. Continue to eat heart healthy diet (full of fruits, vegetables, whole grains, lean protein, water--limit salt, fat, and sugar intake) and increase physical activity as tolerated.  Next appointment: Please schedule your next Medicare Wellness Visit with your Nurse Health Advisor in 1 year by calling [phone].  Preventive Care 78 Years and Older, Male Preventive care refers to lifestyle choices and visits with your health care provider that can promote health and  wellness. What does preventive care include?  A yearly physical exam. This is also called an annual well check.  Dental exams once or twice a year.  Routine eye exams. Ask your health care provider how often you should have your eyes checked.  Personal lifestyle choices, including:  Daily care of your teeth and gums.  Regular physical activity.  Eating a healthy diet.  Avoiding tobacco and drug use.  Limiting alcohol use.  Practicing safe sex.  Taking low doses of aspirin every day.  Taking vitamin and mineral supplements as recommended by your health care provider. What happens during an annual well check? The services and screenings done by your health care provider during your annual well check will depend on your age, overall health, lifestyle risk factors, and family history of disease. Counseling  Your health care provider may ask you questions about your:  Alcohol use.  Tobacco use.  Drug use.  Emotional well-being.  Home and relationship well-being.  Sexual activity.  Eating habits.  History of falls.  Memory and ability to understand (cognition).  Work and work Statistician. Screening  You may have the following tests or measurements:  Height, weight, and BMI.  Blood pressure.  Lipid and cholesterol levels. These may be checked every 5 years, or more frequently if you are over 78 years old.  Skin check.  Lung cancer screening. You may have this screening every year starting at age 78 if you have a 30-pack-year history of smoking and currently smoke or have quit within the past 15 years.  Fecal occult blood test (FOBT) of the stool. You may have this test every year starting at age 78.  Flexible sigmoidoscopy or colonoscopy. You may have a sigmoidoscopy every 5 years or a colonoscopy every 10 years starting at age 78.  Prostate cancer  screening. Recommendations will vary depending on your family history and other risks.  Hepatitis C blood  test.  Hepatitis B blood test.  Sexually transmitted disease (STD) testing.  Diabetes screening. This is done by checking your blood sugar (glucose) after you have not eaten for a while (fasting). You may have this done every 1-3 years.  Abdominal aortic aneurysm (AAA) screening. You may need this if you are a current or former smoker.  Osteoporosis. You may be screened starting at age 78 if you are at high risk. Talk with your health care provider about your test results, treatment options, and if necessary, the need for more tests. Vaccines  Your health care provider may recommend certain vaccines, such as:  Influenza vaccine. This is recommended every year.  Tetanus, diphtheria, and acellular pertussis (Tdap, Td) vaccine. You may need a Td booster every 10 years.  Zoster vaccine. You may need this after age 78.  Pneumococcal 13-valent conjugate (PCV13) vaccine. One dose is recommended after age 78.  Pneumococcal polysaccharide (PPSV23) vaccine. One dose is recommended after age 78. Talk to your health care provider about which screenings and vaccines you need and how often you need them. This information is not intended to replace advice given to you by your health care provider. Make sure you discuss any questions you have with your health care provider. Document Released: 12/26/2015 Document Revised: 08/18/2016 Document Reviewed: 09/30/2015 Elsevier Interactive Patient Education  2017 [place] Prevention in the Home Falls can cause injuries. They can happen to people of all ages. There are many things you can do to make your home safe and to help prevent falls. What can I do on the outside of my home?  Regularly fix the edges of walkways and driveways and fix any cracks.  Remove anything that might make you trip as you walk through a door, such as a raised step or threshold.  Trim any bushes or trees on the path to your home.  Use bright outdoor  lighting.  Clear any walking paths of anything that might make someone trip, such as rocks or tools.  Regularly check to see if handrails are loose or broken. Make sure that both sides of any steps have handrails.  Any raised decks and porches should have guardrails on the edges.  Have any leaves, snow, or ice cleared regularly.  Use sand or salt on walking paths during winter.  Clean up any spills in your garage right away. This includes oil or grease spills. What can I do in the bathroom?  Use night lights.  Install grab bars by the toilet and in the tub and shower. Do not use towel bars as grab bars.  Use non-skid mats or decals in the tub or shower.  If you need to sit down in the shower, use a plastic, non-slip stool.  Keep the floor dry. Clean up any water that spills on the floor as soon as it happens.  Remove soap buildup in the tub or shower regularly.  Attach bath mats securely with double-sided non-slip rug tape.  Do not have throw rugs and other things on the floor that can make you trip. What can I do in the bedroom?  Use night lights.  Make sure that you have a light by your bed that is easy to reach.  Do not use any sheets or blankets that are too big for your bed. They should not hang down onto the floor.  Have a firm  chair that has side arms. You can use this for support while you get dressed.  Do not have throw rugs and other things on the floor that can make you trip. What can I do in the kitchen?  Clean up any spills right away.  Avoid walking on wet floors.  Keep items that you use a lot in easy-to-reach places.  If you need to reach something above you, use a strong step stool that has a grab bar.  Keep electrical cords out of the way.  Do not use floor polish or wax that makes floors slippery. If you must use wax, use non-skid floor wax.  Do not have throw rugs and other things on the floor that can make you trip. What can I do with my  stairs?  Do not leave any items on the stairs.  Make sure that there are handrails on both sides of the stairs and use them. Fix handrails that are broken or loose. Make sure that handrails are as long as the stairways.  Check any carpeting to make sure that it is firmly attached to the stairs. Fix any carpet that is loose or worn.  Avoid having throw rugs at the top or bottom of the stairs. If you do have throw rugs, attach them to the floor with carpet tape.  Make sure that you have a light switch at the top of the stairs and the bottom of the stairs. If you do not have them, ask someone to add them for you. What else can I do to help prevent falls?  Wear shoes that:  Do not have high heels.  Have rubber bottoms.  Are comfortable and fit you well.  Are closed at the toe. Do not wear sandals.  If you use a stepladder:  Make sure that it is fully opened. Do not climb a closed stepladder.  Make sure that both sides of the stepladder are locked into place.  Ask someone to hold it for you, if possible.  Clearly mark and make sure that you can see:  Any grab bars or handrails.  First and last steps.  Where the edge of each step is.  Use tools that help you move around (mobility aids) if they are needed. These include:  Canes.  Walkers.  Scooters.  Crutches.  Turn on the lights when you go into a dark area. Replace any light bulbs as soon as they burn out.  Set up your furniture so you have a clear path. Avoid moving your furniture around.  If any of your floors are uneven, fix them.  If there are any pets around you, be aware of where they are.  Review your medicines with your doctor. Some medicines can make you feel dizzy. This can increase your chance of falling. Ask your doctor what other things that you can do to help prevent falls. This information is not intended to replace advice given to you by your health care provider. Make sure you discuss any  questions you have with your health care provider. Document Released: 09/25/2009 Document Revised: 05/06/2016 Document Reviewed: 01/03/2015 Elsevier Interactive Patient Education  2017 [employer].

## 2021-05-14 NOTE — Progress Notes (Signed)
Subjective:   Jeffery [name] is a 80 y.o. male who presents for Medicare Annual/Subsequent preventive examination.  Review of Systems    No ROS. Medicare Wellness Visit. Additional risk factors are reflected in social history. Cardiac Risk Factors include: advanced age (>50men, >51 women);diabetes mellitus;dyslipidemia;hypertension;family history of premature cardiovascular disease;male gender     Objective:    Today's Vitals   05/14/21 1037  BP: 118/70  Pulse: 70  Temp: 98.1 F (36.7 C)  Weight: 194 lb (88 kg)  Height: 5\' 10"  (1.778 m)   Body mass index is 27.84 kg/m.  Advanced Directives 05/14/2021 12/01/2020 11/26/2020 12/11/2018 12/11/2018 07/25/2018 07/25/2018  Does Patient Have a Medical Advance Directive? Yes No Yes Yes Yes Yes Yes  Type of Advance Directive Living will;Healthcare Power of Attorney - - Living will Living will [place];Living will [place];Living will  Does patient want to make changes to medical advance directive? No - Patient declined - No - Patient declined No - Patient declined - No - Patient declined No - Patient declined  Copy of [place] in Chart? No - copy requested - - - - No - copy requested No - copy requested    Current Medications (verified) Outpatient Encounter Medications as of 05/14/2021  Medication Sig  . ACCU-CHEK AVIVA PLUS test strip USE AS INSTRUCTED TO TEST BLOOD SUGAR 6 TIMES DAILY. DX CODE: E11.59  . ACCU-CHEK SOFTCLIX LANCETS lancets Use to test blood sugar daily as directed.  [name] albuterol (PROAIR HFA) 108 (90 Base) MCG/ACT inhaler Inhale 2 puffs into the lungs every 6 (six) hours as needed for wheezing or shortness of breath.  [name] albuterol (PROVENTIL) (2.5 MG/3ML) 0.083% nebulizer solution TAKE 3 MLS BY NEBULIZATION EVERY 6 (SIX) HOURS AS NEEDED FOR WHEEZING OR SHORTNESS OF BREATH.  [name] aspirin 81 MG tablet Take 81 mg by mouth at bedtime.  . celecoxib (CELEBREX) 100 MG  capsule TAKE 1 CAPSULE BY MOUTH EVERY DAY  . Continuous Blood Gluc Receiver (FREESTYLE LIBRE 14 DAY READER) DEVI 1 Device by Does not apply route every 14 (fourteen) days.  . Continuous Blood Gluc Sensor (FREESTYLE LIBRE 14 DAY SENSOR) MISC APPLY 1 SENSOR EVERY 14 DAYS  . famotidine (PEPCID) 20 MG tablet Take 1 tablet (20 mg total) by mouth daily.  . fexofenadine (ALLEGRA) 180 MG tablet Take 180 mg by mouth daily as needed for allergies.   [name] gabapentin (NEURONTIN) 100 MG capsule Take 2 capsules (200 mg total) by mouth at bedtime.  [name] HYDROcodone-acetaminophen (NORCO) 7.5-325 MG tablet Take 1 tablet by mouth every 6 (six) hours as needed for severe pain (for chronic joint pain, neck pain).  [name] ipratropium (ATROVENT) 0.02 % nebulizer solution Take 2.5 mLs (0.5 mg total) by nebulization every 6 (six) hours. (Patient taking differently: Take 0.5 mg by nebulization every 6 (six) hours as needed.)  . levalbuterol (XOPENEX) 1.25 MG/3ML nebulizer solution Take 1.25 mg by nebulization every 6 (six) hours. (Patient taking differently: Take 1.25 mg by nebulization every 6 (six) hours as needed.)  . LORazepam (ATIVAN) 1 MG tablet TAKE 1/2 TO 1 TABLET BY MOUTH DAILY AS NEEDED.  . metFORMIN (GLUCOPHAGE) 500 MG tablet Take 1 tablet (500 mg total) by mouth 2 (two) times daily with a meal.  . montelukast (SINGULAIR) 10 MG tablet TAKE 1 TABLET BY MOUTH AT  BEDTIME  . Multiple Vitamin (MULTIVITAMIN WITH MINERALS) TABS tablet Take 1 tablet by mouth daily.  . nebivolol (BYSTOLIC) 5  MG tablet Take 1 tablet (5 mg total) by mouth daily. Please make overdue appt with Dr. Tamala Julian before anymore refills. Thank you 1st attempt  . nitroGLYCERIN (NITROSTAT) 0.4 MG SL tablet PLACE 1 TABLET (0.4 MG TOTAL) UNDER THE TONGUE EVERY 5 (FIVE) MINUTES X 3 DOSES AS NEEDED FOR CHEST PAIN.  [name] Respiratory Therapy Supplies (FLUTTER) DEVI Twice a day and prn as needed, may increase if feeling worse  . rosuvastatin (CRESTOR) 10 MG tablet TAKE ONE  TABLET BY MOUTH DAILY ON MONDAY, WEDNESDAY AND FRIDAY  . SYMBICORT 160-4.5 MCG/ACT inhaler USE 1 TO 2 INHALATIONS BY  MOUTH EVERY 12 HOURS AS  NEEDED  . tiZANidine (ZANAFLEX) 4 MG tablet TAKE 1 TABLET BY MOUTH AT BEDTIME. (Patient not taking: Reported on 03/06/2021)  . vardenafil (LEVITRA) 20 MG tablet TAKE 1 TABLET BY MOUTH AS DIRECTED. CAN NOT BE TAKEN WITH NITROGLYCERIN. NOT COVERED BY INSURANCE  . zinc gluconate 50 MG tablet Take 50 mg by mouth daily.   No facility-administered encounter medications on file as of 05/14/2021.    Allergies (verified) Contrast media [iodinated diagnostic agents]   History: Past Medical History:  Diagnosis Date  . Arthritis    "thumbs" (07/25/2018)  . Asthma   . CHF (congestive heart failure) ([place])   . Chronic bronchitis ([place])   . Environmental allergies   . GERD (gastroesophageal reflux disease)   . Headache    "seasonal; w/environmental allergies" (07/25/2018)  . History of blood transfusion    "when I had laminectomy" (07/25/2018)  . HTN (hypertension)   . Hyperlipidemia   . LBBB (left bundle branch block) 1999  . Metatarsal bone fracture 03/07/2018  . Myocardial infarction [facility])    "was told I've had an old MI; probably in the 82s" (07/25/2018)  . Pneumonia    "as a child, age 28; viral pneumonia 3 times in the last 10 years" (07/25/2018)  . Presence of permanent cardiac pacemaker 07/25/2018  . Seasonal allergies   . Sleep apnea    "wife says I do" (07/25/2018)  . Type II diabetes mellitus ([place])    Past Surgical History:  Procedure Laterality Date  . BACK SURGERY    . BIV PACEMAKER INSERTION CRT-P  07/25/2018  . BIV PACEMAKER INSERTION CRT-P N/A 07/25/2018   Procedure: BIV PACEMAKER INSERTION CRT-P;  Surgeon: Evans Lance, MD;  Location: [place] [facility];  Service: Cardiovascular;  Laterality: N/A;  . CARDIAC CATHETERIZATION  2003   Dr Pernell Dupre; 85 % R circumflex obstruction  . CARPAL TUNNEL RELEASE Left 10/11/2014   Procedure: LEFT  CARPAL TUNNEL RELEASE;  Surgeon: Roseanne Kaufman, MD;  Location: [place];  Service: Orthopedics;  Laterality: Left;  . COLONOSCOPY W/ BIOPSIES AND POLYPECTOMY  2018  . INGUINAL HERNIA REPAIR Right 1948  . INGUINAL HERNIA REPAIR Left 1988  . LUMBAR LAMINECTOMY  1984  . MINOR CARPAL TUNNEL Right 11/22/2014   Procedure: RIGHT LIMITED OPEN CARPAL TUNNEL RELEASE;  Surgeon: Roseanne Kaufman, MD;  Location: [place];  Service: Orthopedics;  Laterality: Right;  . TONSILLECTOMY  1958   Family History  Problem Relation Age of Onset  . Heart attack Father        72s  . Colon polyps Father   . Heart failure Mother        90s  . Subarachnoid hemorrhage Mother 41  . Hypertension Mother   . Subarachnoid hemorrhage Paternal Grandfather 40  . Diabetes Maternal Aunt   . Colon cancer Neg  Hx    Social History   Socioeconomic History  . Marital status: Married    Spouse name: Not on file  . Number of children: 3  . Years of education: Not on file  . Highest education level: Professional school degree (e.g., MD, DDS, DVM, JD)  Occupational History  . Occupation: retired Pension scheme manager: [employer]  Tobacco Use  . Smoking status: Former Smoker    Packs/day: 2.00    Years: 2.00    Pack years: 4.00    Types: Cigarettes    Quit date: 12/14/1963    Years since quitting: 57.4  . Smokeless tobacco: Never Used  Vaping Use  . Vaping Use: Never used  Substance and Sexual Activity  . Alcohol use: Not Currently    Alcohol/week: 2.0 standard drinks    Types: 2 Glasses of wine per week    Comment: occasional  . Drug use: Never  . Sexual activity: Yes  Other Topics Concern  . Not on file  Social History Narrative   Lives with wife in a 2 story home.  Has 3 daughters.  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     Social Determinants of Health   Financial Resource Strain: Low Risk   . Difficulty of Paying Living Expenses: Not hard at all  Food Insecurity:  No Food Insecurity  . Worried About Charity fundraiser in the Last Year: Never true  . Ran Out of Food in the Last Year: Never true  Transportation Needs: No Transportation Needs  . Lack of Transportation (Medical): No  . Lack of Transportation (Non-Medical): No  Physical Activity: Sufficiently Active  . Days of Exercise per Week: 5 days  . Minutes of Exercise per Session: 30 min  Stress: No Stress Concern Present  . Feeling of Stress : Not at all  Social Connections: Socially Integrated  . Frequency of Communication with Friends and Family: More than three times a week  . Frequency of Social Gatherings with Friends and Family: More than three times a week  . Attends Religious Services: More than 4 times per year  . Active Member of Clubs or Organizations: Yes  . Attends Archivist Meetings: More than 4 times per year  . Marital Status: Married    Tobacco Counseling Counseling given: Not Answered   Clinical Intake:  Pre-visit preparation completed: Yes  Pain : No/denies pain     BMI - recorded: 27.84 Nutritional Status: BMI 25 -29 Overweight Nutritional Risks: None Diabetes: No  How often do you need to have someone help you when you read instructions, pamphlets, or other written materials from your doctor or pharmacy?: 1 - Never What is the last grade level you completed in school?: Retired Immunologist; Master's Degree  Diabetic? yes  Interpreter Needed?: No  Information entered by :: Lisette Abu, LPN   Activities of Daily Living In your present state of health, do you have any difficulty performing the following activities: 05/14/2021  Hearing? N  Vision? N  Difficulty concentrating or making decisions? N  Walking or climbing stairs? N  Dressing or bathing? N  Doing errands, shopping? N  Preparing Food and eating ? N  Using the Toilet? N  In the past six months, have you accidently leaked urine? N  Do you have problems with loss of bowel control? N   Managing your Medications? N  Managing your Finances? N  Housekeeping or managing your Housekeeping? N  Some recent data might be  hidden    Patient Care Team: [name], MD as PCP - General (Internal Medicine) Belva Crome, MD as PCP - Cardiology (Cardiology) [place], [facility] (Neurosurgery) Charlton Haws, [facility] as Pharmacist (Pharmacist)  Indicate any recent Medical Services you may have received from other than [facility] providers in the past year (date may be approximate).     Assessment:   This is a routine wellness examination for K[place].  Hearing/Vision screen No exam data present  Dietary issues and exercise activities discussed: Current Exercise Habits: Home exercise routine, Type of exercise: walking, Time (Minutes): 30, Frequency (Times/Week): 5, Weekly Exercise (Minutes/Week): 150, Intensity: Moderate, Exercise limited by: respiratory conditions(s);cardiac condition(s);neurologic condition(s)  Goals Addressed   None    Depression Screen PHQ 2/9 Scores 05/14/2021 03/27/2020 08/01/2019 03/18/2015  PHQ - 2 Score 0 0 0 0    Fall Risk Fall Risk  05/14/2021 03/27/2020 02/15/2020 12/25/2018 03/18/2015  Falls in the past year? 0 0 1 0 No  Number falls in past yr: 0 0 1 0 -  Injury with Fall? 0 0 0 0 -  Risk for fall due to : No Fall Risks No Fall Risks - - -  Follow up Falls evaluation completed Education provided - Falls evaluation completed -    FALL RISK PREVENTION PERTAINING TO THE HOME:  Any stairs in or around the home? Yes  If so, are there any without handrails? No  Home free of loose throw rugs in walkways, pet beds, electrical cords, etc? Yes  Adequate lighting in your home to reduce risk of falls? Yes   ASSISTIVE DEVICES UTILIZED TO PREVENT FALLS:  Life alert? No  Use of a cane, walker or w/c? No  Grab bars in the bathroom? Yes  Shower chair or bench in shower? Yes  Elevated toilet seat or a handicapped toilet? No   TIMED UP  AND GO:  Was the test performed? No .  Length of time to ambulate 10 feet: 0 sec.   Gait steady and fast without use of assistive device  Cognitive Function: Normal cognitive status assessed by direct observation by this Nurse Health Advisor. No abnormalities found.       6CIT Screen 03/27/2020  What Year? 0 points  What month? 0 points  What time? 0 points  Count back from 20 0 points  Months in reverse 0 points  Repeat phrase 0 points  Total Score 0    Immunizations Immunization History  Administered Date(s) Administered  . Fluad Quad(high Dose 65+) 08/01/2019  . Influenza Split 08/30/2012  . Influenza, High Dose Seasonal PF 08/31/2017, 08/08/2020  . Influenza,inj,Quad PF,6+ Mos 10/09/2013, 09/12/2015  . Influenza,trivalent, recombinat, inj, PF 08/21/2018  . Influenza-Unspecified 09/29/2014, 09/02/2017, 08/08/2020  . Moderna Sars-Covid-2 Vaccination 01/25/2020, 03/03/2020  . Pneumococcal Conjugate-13 03/04/2016  . Pneumococcal Polysaccharide-23 04/12/2017  . Td 05/13/2008  . Tdap 07/02/2020    TDAP status: Up to date  Flu Vaccine status: Up to date  Pneumococcal vaccine status: Up to date  Covid-19 vaccine status: Completed vaccines  Qualifies for Shingles Vaccine? Yes   Zostavax completed No   Shingrix Completed?: No.    Education has been provided regarding the importance of this vaccine. Patient has been advised to call insurance company to determine out of pocket expense if they have not yet received this vaccine. Advised may also receive vaccine at local pharmacy or Health Dept. Verbalized acceptance and understanding.  Screening Tests Health Maintenance  Topic Date Due  .  OPHTHALMOLOGY EXAM  Never done  . Zoster Vaccines- Shingrix (1 of 2) Never done  . FOOT EXAM  04/18/2020  . COVID-19 Vaccine (3 - Booster for Moderna series) 08/03/2020  . COLONOSCOPY (Pts 45-85yrs Insurance coverage will need to be confirmed)  05/06/2021  . INFLUENZA VACCINE   07/13/2021  . HEMOGLOBIN A1C  08/19/2021  . URINE MICROALBUMIN  02/16/2022  . TETANUS/TDAP  07/02/2030  . Hepatitis C Screening  Completed  . PNA vac Low Risk Adult  Completed  . HPV VACCINES  Aged Out    Health Maintenance  Health Maintenance Due  Topic Date Due  . OPHTHALMOLOGY EXAM  Never done  . Zoster Vaccines- Shingrix (1 of 2) Never done  . FOOT EXAM  04/18/2020  . COVID-19 Vaccine (3 - Booster for Moderna series) 08/03/2020  . COLONOSCOPY (Pts 45-21yrs Insurance coverage will need to be confirmed)  05/06/2021    Colorectal cancer screening: Type of screening: Colonoscopy. Completed 05/06/2016. Repeat every 5-10 years  Lung Cancer Screening: (Low Dose CT Chest recommended if Age 42-80 years, 30 pack-year currently smoking OR have quit w/in 15years.) does not qualify.   Lung Cancer Screening Referral: no  Additional Screening:  Hepatitis C Screening: does qualify; Completed yes  Vision Screening: Recommended annual ophthalmology exams for early detection of glaucoma and other disorders of the eye. Is the patient up to date with their annual eye exam?  No  Who is the provider or what is the name of the office in which the patient attends annual eye exams? Refused If pt is not established with a provider, would they like to be referred to a provider to establish care? No .   Dental Screening: Recommended annual dental exams for proper oral hygiene  Community Resource Referral / Chronic Care Management: CRR required this visit?  No   CCM required this visit?  No      Plan:     I have personally reviewed and noted the following in the patient's chart:   . Medical and social history . Use of alcohol, tobacco or illicit drugs  . Current medications and supplements including opioid prescriptions. Patient is currently taking opioid prescriptions. Information provided to patient regarding non-opioid alternatives. Patient advised to discuss non-opioid treatment plan with  their provider. . Functional ability and status . Nutritional status . Physical activity . Advanced directives . List of other physicians . Hospitalizations, surgeries, and ER visits in previous 12 months . Vitals . Screenings to include cognitive, depression, and falls . Referrals and appointments  In addition, I have reviewed and discussed with patient certain preventive protocols, quality metrics, and best practice recommendations. A written personalized care plan for preventive services as well as general preventive health recommendations were provided to patient.     Sheral Flow, LPN   [id number]/9811   Nurse Notes: n/a

## 2021-05-18 NOTE — Progress Notes (Signed)
Remote pacemaker transmission.   

## 2021-05-19 ENCOUNTER — Other Ambulatory Visit: Payer: Self-pay

## 2021-05-19 MED ORDER — METFORMIN HCL 500 MG PO TABS: 500.0000 mg | ORAL_TABLET | Freq: Two times a day (BID) | ORAL | 2 refills | Status: DC

## 2021-05-19 NOTE — Telephone Encounter (Signed)
Sent in today for patient. 

## 2021-05-19 NOTE — Telephone Encounter (Signed)
Patient is requesting metFORMIN (GLUCOPHAGE) 500 MG tablet be sent to [facility] [place], [place] - [address]. Please advise

## 2021-06-02 ENCOUNTER — Other Ambulatory Visit: Payer: Self-pay | Admitting: Family Medicine

## 2021-06-03 ENCOUNTER — Telehealth: Payer: Self-pay

## 2021-06-03 MED ORDER — NEBIVOLOL HCL 5 MG PO TABS: 5.0000 mg | ORAL_TABLET | Freq: Every day | ORAL | 2 refills | Status: DC

## 2021-06-03 NOTE — Telephone Encounter (Signed)
Refill request

## 2021-06-03 NOTE — Progress Notes (Signed)
[facility] [address] [place] Phone: [phone] Subjective:   I [name] am serving as a Education administrator for Dr. [name].  This visit occurred during the SARS-CoV-2 public health emergency.  Safety protocols were in place, including screening questions prior to the visit, additional usage of staff PPE, and extensive cleaning of exam room while observing appropriate contact time as indicated for disinfecting solutions.   I'm seeing this patient by the request  of:  [name], MD  CC: neck pain follow up   [id number]  10/23/2020 Severe overall.  At this point I would like to refer patient to neurosurgery.  We have discussed this over my chart and I do feel fine further evaluation to make sure that this can be treated conservatively or if any intervention is necessary.  We discussed multiple different medications with patient not responding previously to the gabapentin.  Patient is concerned the Cymbalta with his cardiac history.  Patient though is taking Excedrin fairly regularly.  Discontinued all other anti-inflammatories and will do a low-dose of Celebrex just 1 time daily.  We discussed the potential for the cardiovascular risk factor of this.  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  Patient also felt more comfortable with this than the Cymbalta that was suggested.  Patient was concerned with a QT prolongation.  Patient can check with his cardiologist.  Patient does get his pain medications from another provider and can take that for the breakthrough pain.  Patient does have some Zanaflex as well he can take at night if necessary.  Encourage him to continue to do the exercises more just range of motion with minimal lifting until further evaluation by neurosurgery patient will follow up with me again in 2 to 3 months  Update  06/04/2021 MAXEN ROWLAND is a 80 y.o. male coming in with complaint of neck pain. Was referred to Neurosurgery.  We discussed conservative measures and continue to refill his Celebrex 1 time daily.  We discussed the possibility of Cymbalta but patient was concerned with the possibility for QT prolongation.  Was going to check with his cardiologist.  Patient had muscle relaxer for breakthrough pain.  Patient even undergone formal physical therapy with last visit being in February.  Patient states his pain is at a 7/10 today. Just got back from 4 day trip.  States daily activities such as driving has become very difficult.  Feels like his wife has to do more more of his regular daily activities for him.   Patient did have a CT scan in October 2021.  This was independently visualized by me again today.  Moderate spinal canal stenosis at C1-C2 with cord impingement noted.  Severe multilevel degenerative disc disease of the cervical spine also noted at C5-C7 causing moderate canal stenosis     Past Medical History:  Diagnosis Date   Arthritis    "thumbs" (07/25/2018)   Asthma    CHF (congestive heart failure) (HCC)    Chronic bronchitis (HCC)    Environmental allergies    GERD (gastroesophageal reflux disease)    Headache    "seasonal; w/environmental allergies" (07/25/2018)   History of blood transfusion    "when I had laminectomy" (07/25/2018)   HTN (hypertension)    Hyperlipidemia    LBBB (left bundle branch block) 1999   Metatarsal bone fracture 03/07/2018   Myocardial infarction ([facility])    "  was told I've had an old MI; probably in the 23s" (07/25/2018)   Pneumonia    "as a child, age 16; viral pneumonia 3 times in the last 10 years" (07/25/2018)   Presence of permanent cardiac pacemaker 07/25/2018   Seasonal allergies    Sleep apnea    "wife says I do" (07/25/2018)   Type II diabetes mellitus ([facility])    Past Surgical History:  Procedure Laterality Date   BACK SURGERY     BIV PACEMAKER  INSERTION CRT-P  07/25/2018   BIV PACEMAKER INSERTION CRT-P N/A 07/25/2018   Procedure: BIV PACEMAKER INSERTION CRT-P;  Surgeon: Evans Lance, MD;  Location: [facility];  Service: Cardiovascular;  Laterality: N/A;   CARDIAC CATHETERIZATION  2003   Dr Pernell Dupre; 85 % R circumflex obstruction   CARPAL TUNNEL RELEASE Left 10/11/2014   Procedure: LEFT CARPAL TUNNEL RELEASE;  Surgeon: Roseanne Kaufman, MD;  Location: [facility];  Service: Orthopedics;  Laterality: Left;   COLONOSCOPY W/ BIOPSIES AND POLYPECTOMY  2018   INGUINAL HERNIA REPAIR Right 1948   INGUINAL HERNIA REPAIR Left 1988   LUMBAR LAMINECTOMY  1984   MINOR CARPAL TUNNEL Right 11/22/2014   Procedure: RIGHT LIMITED OPEN CARPAL TUNNEL RELEASE;  Surgeon: Roseanne Kaufman, MD;  Location: [facility];  Service: Orthopedics;  Laterality: Right;   TONSILLECTOMY  1958   Social History   Socioeconomic History   Marital status: Married    Spouse name: Not on file   Number of children: 3   Years of education: Not on file   Highest education level: Professional school degree (e.g., MD, DDS, DVM, JD)  Occupational History   Occupation: retired Pension scheme manager: [employer]  Tobacco Use   Smoking status: Former    Packs/day: 2.00    Years: 2.00    Pack years: 4.00    Types: Cigarettes    Quit date: 12/14/1963    Years since quitting: 57.5   Smokeless tobacco: Never  Vaping Use   Vaping Use: Never used  Substance and Sexual Activity   Alcohol use: Not Currently    Alcohol/week: 2.0 standard drinks    Types: 2 Glasses of wine per week    Comment: occasional   Drug use: Never   Sexual activity: Yes  Other Topics Concern   Not on file  Social History Narrative   Lives with wife in a 2 story home.  Has 3 daughters.  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     Social Determinants of Health   Financial Resource Strain: Low Risk    Difficulty of Paying Living Expenses: Not hard at all   Food Insecurity: No Food Insecurity   Worried About Charity fundraiser in the Last Year: Never true   [place] in the Last Year: Never true  Transportation Needs: No Transportation Needs   Lack of Transportation (Medical): No   Lack of Transportation (Non-Medical): No  Physical Activity: Sufficiently Active   Days of Exercise per Week: 5 days   Minutes of Exercise per Session: 30 min  Stress: No Stress Concern Present   Feeling of Stress : Not at all  Social Connections: Socially Integrated   Frequency of Communication with Friends and Family: More than three times a week   Frequency of Social Gatherings with Friends and Family: More than three times a week   Attends Religious Services: More than 4 times per year   Active  Member of Clubs or Organizations: Yes   Attends Music therapist: More than 4 times per year   Marital Status: Married   Allergies  Allergen Reactions   Contrast Media [Iodinated Diagnostic Agents] Other (See Comments)    Redness and warm sensation, this reaction was noted on 02/27/13 during a Cardiac MRI per pt.  Pt sts he had erythema on his head, chest, and shoulders.  Pt had a pacemaker placed August 2019 and was pre-medicated for the dye and had no allergic reaction at that time. -Carissa Mozingo B.S. RT(R)(CT)   Family History  Problem Relation Age of Onset   Heart attack Father        26s   Colon polyps Father    Heart failure Mother        90s   Subarachnoid hemorrhage Mother 80   Hypertension Mother    Subarachnoid hemorrhage Paternal Grandfather 40   Diabetes Maternal Aunt    Colon cancer Neg Hx     Current Outpatient Medications (Endocrine & Metabolic):    metFORMIN (GLUCOPHAGE) 500 MG tablet, Take 1 tablet (500 mg total) by mouth 2 (two) times daily with a meal.  Current Outpatient Medications (Cardiovascular):    nebivolol (BYSTOLIC) 5 MG tablet, Take 1 tablet (5 mg total) by mouth daily. Please keep upcoming appointment  for further refills   nitroGLYCERIN (NITROSTAT) 0.4 MG SL tablet, PLACE 1 TABLET (0.4 MG TOTAL) UNDER THE TONGUE EVERY 5 (FIVE) MINUTES X 3 DOSES AS NEEDED FOR CHEST PAIN.   rosuvastatin (CRESTOR) 10 MG tablet, TAKE ONE TABLET BY MOUTH DAILY ON MONDAY, WEDNESDAY AND FRIDAY   vardenafil (LEVITRA) 20 MG tablet, TAKE 1 TABLET BY MOUTH AS DIRECTED. CAN NOT BE TAKEN WITH NITROGLYCERIN. NOT COVERED BY INSURANCE  Current Outpatient Medications (Respiratory):    albuterol (PROAIR HFA) 108 (90 Base) MCG/ACT inhaler, Inhale 2 puffs into the lungs every 6 (six) hours as needed for wheezing or shortness of breath.   albuterol (PROVENTIL) (2.5 MG/3ML) 0.083% nebulizer solution, TAKE 3 MLS BY NEBULIZATION EVERY 6 (SIX) HOURS AS NEEDED FOR WHEEZING OR SHORTNESS OF BREATH.   fexofenadine (ALLEGRA) 180 MG tablet, Take 180 mg by mouth daily as needed for allergies.    ipratropium (ATROVENT) 0.02 % nebulizer solution, Take 2.5 mLs (0.5 mg total) by nebulization every 6 (six) hours. (Patient taking differently: Take 0.5 mg by nebulization every 6 (six) hours as needed.)   levalbuterol (XOPENEX) 1.25 MG/3ML nebulizer solution, Take 1.25 mg by nebulization every 6 (six) hours. (Patient taking differently: Take 1.25 mg by nebulization every 6 (six) hours as needed.)   montelukast (SINGULAIR) 10 MG tablet, TAKE 1 TABLET BY MOUTH AT  BEDTIME   SYMBICORT 160-4.5 MCG/ACT inhaler, USE 1 TO 2 INHALATIONS BY  MOUTH EVERY 12 HOURS AS  NEEDED  Current Outpatient Medications (Analgesics):    aspirin 81 MG tablet, Take 81 mg by mouth at bedtime.   HYDROcodone-acetaminophen (NORCO) 7.5-325 MG tablet, Take 1 tablet by mouth every 6 (six) hours as needed for severe pain (for chronic joint pain, neck pain).   celecoxib (CELEBREX) 100 MG capsule, TAKE 1 CAPSULE BY MOUTH EVERY DAY   Current Outpatient Medications (Other):    ACCU-CHEK AVIVA PLUS test strip, USE AS INSTRUCTED TO TEST BLOOD SUGAR 6 TIMES DAILY. DX CODE: E11.59    ACCU-CHEK SOFTCLIX LANCETS lancets, Use to test blood sugar daily as directed.   Continuous Blood Gluc Receiver (FREESTYLE LIBRE 14 DAY READER) DEVI, 1 Device by Does not  apply route every 14 (fourteen) days.   Continuous Blood Gluc Sensor (FREESTYLE LIBRE 14 DAY SENSOR) MISC, APPLY 1 SENSOR EVERY 14 DAYS   gabapentin (NEURONTIN) 100 MG capsule, Take 2 capsules (200 mg total) by mouth at bedtime.   gabapentin (NEURONTIN) 100 MG capsule, Take 2 capsules (200 mg total) by mouth 3 (three) times daily.   LORazepam (ATIVAN) 1 MG tablet, TAKE 1/2 TO 1 TABLET BY MOUTH DAILY AS NEEDED.   Multiple Vitamin (MULTIVITAMIN WITH MINERALS) TABS tablet, Take 1 tablet by mouth daily.   Respiratory Therapy Supplies (FLUTTER) DEVI, Twice a day and prn as needed, may increase if feeling worse   tiZANidine (ZANAFLEX) 4 MG tablet, TAKE 1 TABLET BY MOUTH AT BEDTIME.   zinc gluconate 50 MG tablet, Take 50 mg by mouth daily.   famotidine (PEPCID) 20 MG tablet, Take 1 tablet (20 mg total) by mouth daily.   Reviewed prior external information including notes and imaging from  primary care provider As well as notes that were available from care everywhere and other healthcare systems.  Past medical history, social, surgical and family history all reviewed in electronic medical record.  No pertanent information unless stated regarding to the chief complaint.   Review of Systems:  No  visual changes, nausea, vomiting, diarrhea, constipation, dizziness, abdominal pain, skin rash, fevers, chills, night sweats, weight loss, swollen lymph nodes,  joint swelling, chest pain, shortness of breath, mood changes. POSITIVE muscle aches, body aches, headache occipital region  Objective  Blood pressure 122/80, pulse 64, height 5\' 10"  (1.778 m), weight 195 lb (88.5 kg), SpO2 96 %.   General: No apparent distress alert and oriented x3 mood and affect normal, dressed appropriately.  HEENT: Pupils equal, extraocular movements intact   Respiratory: Patient's speak in full sentences and does not appear short of breath  Cardiovascular: No lower extremity edema, non tender, no erythema  Gait normal with good balance and coordination.  MSK:  Significant arthritic changes of multiple joints Patient's neck exam has very minimal range of motion in all.  Patient turns his thoracic spine to turn his head.  Has less than 5 degrees of sidebending.  Does seem neurovascularly intact in the arms and has good range of motion.   Impression and Recommendations:     The above documentation has been reviewed and is accurate and complete Lyndal Pulley, DO

## 2021-06-04 ENCOUNTER — Ambulatory Visit: Payer: Medicare Other | Admitting: Family Medicine

## 2021-06-04 ENCOUNTER — Other Ambulatory Visit: Payer: Self-pay

## 2021-06-04 ENCOUNTER — Encounter: Payer: Self-pay | Admitting: Family Medicine

## 2021-06-04 DIAGNOSIS — M5030 Other cervical disc degeneration, unspecified cervical region: Secondary | ICD-10-CM

## 2021-06-04 MED ORDER — GABAPENTIN 100 MG PO CAPS: 200.0000 mg | ORAL_CAPSULE | Freq: Three times a day (TID) | ORAL | 3 refills | Status: DC

## 2021-06-04 NOTE — Patient Instructions (Addendum)
Good to see you Gabapentin 200 3 times a day Continue celebrex Cymbalta read  Send a message in 2 weeks to tell me how that is doing we can discuss cymbalta See me again in 6 weeks

## 2021-06-04 NOTE — Assessment & Plan Note (Signed)
Significant arthritic changes of the neck.  From imaging patient does have significant spinal stenosis noted at the C1-C2 area.  Has talked to neurosurgery and they did discuss the possibility of fusion at this level but there are risks of course what this the patient would like to avoid.  Patient was having difficulty with daily activities as well as with sleep.  We discussed once again about the potential for Cymbalta but patient is still significantly symptomatic.  CT prolongation and will look into it.  Refill patient's Celebrex.  Increase gabapentin to 200 mg 3 times daily.  We have tried muscle relaxers in the past as well.  We discussed the possibility of epidurals but patient would be a very difficult injection.  Discussed the potential for repeating formal physical therapy for more of the other modalities but patient is concerned with this as well as massage because sometimes it seems to exacerbate it.  Patient will try these different changes and follow-up with me again in 6 weeks to discuss further.  Total time with patient reviewing imaging and discussing with him 36 minutes

## 2021-06-06 ENCOUNTER — Other Ambulatory Visit: Payer: Self-pay | Admitting: Pulmonary Disease

## 2021-06-06 DIAGNOSIS — J129 Viral pneumonia, unspecified: Secondary | ICD-10-CM

## 2021-06-06 DIAGNOSIS — J45909 Unspecified asthma, uncomplicated: Secondary | ICD-10-CM

## 2021-06-16 ENCOUNTER — Encounter: Payer: Self-pay | Admitting: Internal Medicine

## 2021-06-17 MED ORDER — HYDROCODONE-ACETAMINOPHEN 7.5-325 MG PO TABS: 1.0000 | ORAL_TABLET | Freq: Four times a day (QID) | ORAL | 0 refills | Status: DC | PRN

## 2021-06-19 ENCOUNTER — Telehealth: Payer: Self-pay | Admitting: Pharmacist

## 2021-06-22 NOTE — Progress Notes (Signed)
Chronic Care Management Pharmacy Assistant   Name: Jeffery [name]  MRN: [id number] DOB: [birth year]   Reason for Encounter: Disease State   Conditions to be addressed/monitored: HTN   Recent office visits:  None ID  Recent consult visits:  06/04/21 Jeffery Saas DO Sports Medicine (Neck pain) medication changes Gabapentin 200 mg 3 times daily  Hospital visits:  None in previous 6 months  Medications: Outpatient Encounter Medications as of 06/19/2021  Medication Sig   ACCU-CHEK AVIVA PLUS test strip USE AS INSTRUCTED TO TEST BLOOD SUGAR 6 TIMES DAILY. DX CODE: E11.59   ACCU-CHEK SOFTCLIX LANCETS lancets Use to test blood sugar daily as directed.   albuterol (PROAIR HFA) 108 (90 Base) MCG/ACT inhaler Inhale 2 puffs into the lungs every 6 (six) hours as needed for wheezing or shortness of breath.   albuterol (PROVENTIL) (2.5 MG/3ML) 0.083% nebulizer solution TAKE 3 MLS BY NEBULIZATION EVERY 6 (SIX) HOURS AS NEEDED FOR WHEEZING OR SHORTNESS OF BREATH.   aspirin 81 MG tablet Take 81 mg by mouth at bedtime.   celecoxib (CELEBREX) 100 MG capsule TAKE 1 CAPSULE BY MOUTH EVERY DAY   Continuous Blood Gluc Receiver (FREESTYLE LIBRE 14 DAY READER) DEVI 1 Device by Does not apply route every 14 (fourteen) days.   Continuous Blood Gluc Sensor (FREESTYLE LIBRE 14 DAY SENSOR) MISC APPLY 1 SENSOR EVERY 14 DAYS   fexofenadine (ALLEGRA) 180 MG tablet Take 180 mg by mouth daily as needed for allergies.    gabapentin (NEURONTIN) 100 MG capsule Take 2 capsules (200 mg total) by mouth at bedtime.   gabapentin (NEURONTIN) 100 MG capsule Take 2 capsules (200 mg total) by mouth 3 (three) times daily.   HYDROcodone-acetaminophen (NORCO) 7.5-325 MG tablet Take 1 tablet by mouth every 6 (six) hours as needed for severe pain (for chronic joint pain, neck pain).   ipratropium (ATROVENT) 0.02 % nebulizer solution Take 2.5 mLs (0.5 mg total) by nebulization every 6 (six) hours. (Patient taking differently:  Take 0.5 mg by nebulization every 6 (six) hours as needed.)   levalbuterol (XOPENEX) 1.25 MG/3ML nebulizer solution Take 1.25 mg by nebulization every 6 (six) hours. (Patient taking differently: Take 1.25 mg by nebulization every 6 (six) hours as needed.)   LORazepam (ATIVAN) 1 MG tablet TAKE 1/2 TO 1 TABLET BY MOUTH DAILY AS NEEDED.   metFORMIN (GLUCOPHAGE) 500 MG tablet Take 1 tablet (500 mg total) by mouth 2 (two) times daily with a meal.   montelukast (SINGULAIR) 10 MG tablet TAKE 1 TABLET BY MOUTH AT  BEDTIME   Multiple Vitamin (MULTIVITAMIN WITH MINERALS) TABS tablet Take 1 tablet by mouth daily.   nebivolol (BYSTOLIC) 5 MG tablet Take 1 tablet (5 mg total) by mouth daily. Please keep upcoming appointment for further refills   nitroGLYCERIN (NITROSTAT) 0.4 MG SL tablet PLACE 1 TABLET (0.4 MG TOTAL) UNDER THE TONGUE EVERY 5 (FIVE) MINUTES X 3 DOSES AS NEEDED FOR CHEST PAIN.   Respiratory Therapy Supplies (FLUTTER) DEVI Twice a day and prn as needed, may increase if feeling worse   rosuvastatin (CRESTOR) 10 MG tablet TAKE ONE TABLET BY MOUTH DAILY ON MONDAY, WEDNESDAY AND FRIDAY   SYMBICORT 160-4.5 MCG/ACT inhaler USE 1 TO 2 INHALATIONS BY  MOUTH EVERY 12 HOURS AS  NEEDED   tiZANidine (ZANAFLEX) 4 MG tablet TAKE 1 TABLET BY MOUTH AT BEDTIME.   vardenafil (LEVITRA) 20 MG tablet TAKE 1 TABLET BY MOUTH AS DIRECTED. CAN NOT BE TAKEN WITH NITROGLYCERIN. NOT COVERED BY INSURANCE  zinc gluconate 50 MG tablet Take 50 mg by mouth daily.   No facility-administered encounter medications on file as of 06/19/2021.    Pharmacist Review Reviewed chart prior to disease state call. Spoke with patient regarding BP  Recent Office Vitals: BP Readings from Last 3 Encounters:  06/04/21 122/80  05/14/21 118/70  02/16/21 (!) 142/80   Pulse Readings from Last 3 Encounters:  06/04/21 64  05/14/21 70  02/16/21 84    Wt Readings from Last 3 Encounters:  06/04/21 195 lb (88.5 kg)  05/14/21 194 lb (88 kg)   02/16/21 194 lb 3.2 oz (88.1 kg)     Kidney Function Lab Results  Component Value Date/Time   CREATININE 1.05 02/16/2021 10:10 AM   CREATININE 1.13 08/20/2020 11:44 AM   CREATININE 1.05 02/15/2020 08:50 AM   GFR 68.47 02/16/2021 10:10 AM   GFRNONAA 62 08/20/2020 11:44 AM   GFRAA 72 08/20/2020 11:44 AM    BMP Latest Ref Rng & Units 02/16/2021 08/20/2020 02/15/2020  Glucose 70 - 99 mg/dL 108(H) 137(H) 122(H)  BUN 6 - 23 mg/dL 21 22 18   Creatinine 0.40 - 1.50 mg/dL 1.05 1.13 1.05  BUN/Creat Ratio 6 - 22 (calc) - NOT APPLICABLE -  Sodium 109 - 145 mEq/L 139 140 140  Potassium 3.5 - 5.1 mEq/L 4.5 5.5(H) 4.3  Chloride 96 - 112 mEq/L 103 103 104  CO2 19 - 32 mEq/L 28 30 30   Calcium 8.4 - 10.5 mg/dL 9.4 9.6 9.8    Current antihypertensive regimen:  Bystolic 5 mg 1 tab daily  How often are you checking your Blood Pressure?  Patient takes blood pressure every other day  Current home BP readings: Patient last blood pressure reading 06/22/21 was 136/74  What recent interventions/DTPs have been made by any provider to improve Blood Pressure control since last CPP Visit: None ID  Any recent hospitalizations or ED visits since last visit with CPP? No  What diet changes have been made to improve Blood Pressure Control?  Patient states that he has ot made any changes to diet  What exercise is being done to improve your Blood Pressure Control?  Patient states that he does not go to a gym but is very active around the house with yard work  Adherence Review: Is the patient currently on ACE/ARB medication? No Does the patient have >5 day gap between last estimated fill dates? No   Star Rating Drugs: Rosuvastatin 05/14/21 90 ds Metformin 05/19/21 90 ds  [place] Pharmacist Assistant [phone]   Time spent:29

## 2021-07-09 ENCOUNTER — Other Ambulatory Visit: Payer: Self-pay | Admitting: Internal Medicine

## 2021-07-20 ENCOUNTER — Other Ambulatory Visit: Payer: Self-pay | Admitting: Internal Medicine

## 2021-07-20 MED ORDER — HYDROCODONE-ACETAMINOPHEN 7.5-325 MG PO TABS: 1.0000 | ORAL_TABLET | Freq: Four times a day (QID) | ORAL | 0 refills | Status: DC | PRN

## 2021-07-29 ENCOUNTER — Other Ambulatory Visit: Payer: Self-pay | Admitting: Internal Medicine

## 2021-07-31 ENCOUNTER — Ambulatory Visit (INDEPENDENT_AMBULATORY_CARE_PROVIDER_SITE_OTHER): Payer: Medicare Other

## 2021-07-31 DIAGNOSIS — I441 Atrioventricular block, second degree: Secondary | ICD-10-CM | POA: Diagnosis not present

## 2021-08-03 ENCOUNTER — Telehealth: Payer: Self-pay

## 2021-08-03 LAB — CUP PACEART REMOTE DEVICE CHECK
Battery Remaining Longevity: 56 mo
Battery Remaining Percentage: 63 %
Battery Voltage: 2.98 V
Brady Statistic AP VP Percent: 31 %
Brady Statistic AP VS Percent: 1 %
Brady Statistic AS VP Percent: 68 %
Brady Statistic AS VS Percent: 1 %
Brady Statistic RA Percent Paced: 31 %
Date Time Interrogation Session: 20220819020010
Implantable Lead Implant Date: 20190813
Implantable Lead Implant Date: 20190813
Implantable Lead Implant Date: 20190813
Implantable Lead Location: 753858
Implantable Lead Location: 753859
Implantable Lead Location: 753860
Implantable Pulse Generator Implant Date: 20190813
Lead Channel Impedance Value: 450 Ohm
Lead Channel Impedance Value: 480 Ohm
Lead Channel Impedance Value: 730 Ohm
Lead Channel Pacing Threshold Amplitude: 0.5 V
Lead Channel Pacing Threshold Amplitude: 0.75 V
Lead Channel Pacing Threshold Amplitude: 1.375 V
Lead Channel Pacing Threshold Pulse Width: 0.5 ms
Lead Channel Pacing Threshold Pulse Width: 0.5 ms
Lead Channel Pacing Threshold Pulse Width: 0.5 ms
Lead Channel Sensing Intrinsic Amplitude: 10.8 mV
Lead Channel Sensing Intrinsic Amplitude: 2.3 mV
Lead Channel Setting Pacing Amplitude: 1.75 V
Lead Channel Setting Pacing Amplitude: 2.375
Lead Channel Setting Pacing Amplitude: 2.5 V
Lead Channel Setting Pacing Pulse Width: 0.5 ms
Lead Channel Setting Pacing Pulse Width: 0.5 ms
Lead Channel Setting Sensing Sensitivity: 5 mV
Pulse Gen Model: 3562
Pulse Gen Serial Number: 9043864

## 2021-08-03 NOTE — Telephone Encounter (Signed)
Scheduled remote reviewed. Normal device function. Reoccurring multiple Atrial noise revisions due to and AMS events due to EMI. Seen in clinic 08/12 per previous note.  Attempted to contact patient d/t over in-clinic OV/device check. No answer, LMTCB.

## 2021-08-04 ENCOUNTER — Other Ambulatory Visit: Payer: Self-pay | Admitting: Internal Medicine

## 2021-08-04 NOTE — Telephone Encounter (Signed)
Pt returned phone call.  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  Recently he has used equipment like a Scientist, water quality and a grinder.  He cannot pinpoint what he might have been using on the dates in question.    Forwarded patient an email with link to [facility] EMI list.     Also advised patient I will have someone from scheduling call him to set up appt with Dr. Lovena Le or APP as he is currently due to be seen.

## 2021-08-10 ENCOUNTER — Encounter: Payer: Self-pay | Admitting: Internal Medicine

## 2021-08-17 DIAGNOSIS — G479 Sleep disorder, unspecified: Secondary | ICD-10-CM | POA: Insufficient documentation

## 2021-08-17 DIAGNOSIS — E0842 Diabetes mellitus due to underlying condition with diabetic polyneuropathy: Secondary | ICD-10-CM | POA: Insufficient documentation

## 2021-08-17 NOTE — Patient Instructions (Addendum)
    Blood work was ordered.      Medications changes include :   none     Please followup in 6 months  

## 2021-08-17 NOTE — Progress Notes (Signed)
Subjective:    Patient ID: Jeffery [name], male    DOB: [birth year], 80 y.o.   MRN: [id number]  HPI The patient is here for follow up of their chronic medical problems, including chronic pain management, DM, htn, combined HF, hichol, sleep difficulties, asthma  He is very active.  He has no concerns.     Sugars are good at home.  95-110 fasting if eating well.   He already has his flu vaccine   Medications and allergies reviewed with patient and updated if appropriate.  Patient Active Problem List   Diagnosis Date Noted   Sleep difficulties 08/17/2021   Aortic atherosclerosis ([place]) 02/15/2021   Degenerative disc disease, cervical 09/23/2020   Nonallopathic lesion of cervical region 09/23/2020   Nonallopathic lesion of thoracic region 09/23/2020   Pain medication agreement signed, 02/16/2021 02/15/2020   Peroneal mononeuropathy, left 12/25/2018   Diabetic polyneuropathy associated with type 2 diabetes mellitus ([place]) 12/25/2018   Cylindrical bronchiectasis ([place]) 12/20/2018   Viral pneumonia 12/12/2018   Community acquired pneumonia 11/18/2018   Encounter for chronic pain management 04/13/2017   Osteoarthritis 04/12/2017   Chronic combined systolic and diastolic CHF (congestive heart failure) ([place]) 01/24/2015   Left bundle branch block 11/27/2013   Essential hypertension 11/27/2013   GERD (gastroesophageal reflux disease) 02/27/2013   Type 2 diabetes mellitus with vascular disease ([place]) 04/09/2009   HLD (hyperlipidemia) 09/06/2008   Coronary atherosclerosis 09/06/2008   Asthma 09/06/2008    Current Outpatient Medications on File Prior to Visit  Medication Sig Dispense Refill   ACCU-CHEK AVIVA PLUS test strip USE AS INSTRUCTED TO TEST BLOOD SUGAR 6 TIMES DAILY. DX CODE: E11.59 500 strip 4   ACCU-CHEK SOFTCLIX LANCETS lancets Use to test blood sugar daily as directed. 100 each 3   albuterol (PROAIR HFA) 108 (90 Base) MCG/ACT inhaler Inhale 2 puffs into the lungs every 6  (six) hours as needed for wheezing or shortness of breath. 6.7 g 11   albuterol (PROVENTIL) (2.5 MG/3ML) 0.083% nebulizer solution TAKE 3 MLS BY NEBULIZATION EVERY 6 (SIX) HOURS AS NEEDED FOR WHEEZING OR SHORTNESS OF BREATH. 150 mL 5   aspirin 81 MG tablet Take 81 mg by mouth at bedtime.     celecoxib (CELEBREX) 100 MG capsule TAKE 1 CAPSULE BY MOUTH EVERY DAY 90 capsule 0   Continuous Blood Gluc Receiver (FREESTYLE LIBRE 14 DAY READER) DEVI 1 Device by Does not apply route every 14 (fourteen) days. 1 Device 0   Continuous Blood Gluc Sensor (FREESTYLE LIBRE 14 DAY SENSOR) MISC APPLY 1 SENSOR EVERY 14 DAYS 2 each 2   fexofenadine (ALLEGRA) 180 MG tablet Take 180 mg by mouth daily as needed for allergies.      gabapentin (NEURONTIN) 100 MG capsule Take 2 capsules (200 mg total) by mouth 3 (three) times daily. 180 capsule 3   HYDROcodone-acetaminophen (NORCO) 7.5-325 MG tablet Take 1 tablet by mouth every 6 (six) hours as needed for severe pain (for chronic joint pain, neck pain). 90 tablet 0   ipratropium (ATROVENT) 0.02 % nebulizer solution Take 2.5 mLs (0.5 mg total) by nebulization every 6 (six) hours. (Patient taking differently: Take 0.5 mg by nebulization every 6 (six) hours as needed.) 75 mL 3   levalbuterol (XOPENEX) 1.25 MG/3ML nebulizer solution Take 1.25 mg by nebulization every 6 (six) hours. (Patient taking differently: Take 1.25 mg by nebulization every 6 (six) hours as needed.) 120 mL 3   LORazepam (ATIVAN) 1 MG tablet TAKE 1/2 TO 1  TABLET BY MOUTH DAILY AS NEEDED 30 tablet 0   metFORMIN (GLUCOPHAGE) 500 MG tablet Take 1 tablet (500 mg total) by mouth 2 (two) times daily with a meal. 120 tablet 2   montelukast (SINGULAIR) 10 MG tablet TAKE 1 TABLET BY MOUTH AT  BEDTIME 90 tablet 3   Multiple Vitamin (MULTIVITAMIN WITH MINERALS) TABS tablet Take 1 tablet by mouth daily.     nebivolol (BYSTOLIC) 5 MG tablet Take 1 tablet (5 mg total) by mouth daily. Please keep upcoming appointment for  further refills 90 tablet 2   nitroGLYCERIN (NITROSTAT) 0.4 MG SL tablet PLACE 1 TABLET (0.4 MG TOTAL) UNDER THE TONGUE EVERY 5 (FIVE) MINUTES X 3 DOSES AS NEEDED FOR CHEST PAIN. 75 tablet 1   Respiratory Therapy Supplies (FLUTTER) DEVI Twice a day and prn as needed, may increase if feeling worse 1 each 0   rosuvastatin (CRESTOR) 10 MG tablet TAKE ONE TABLET BY MOUTH DAILY ON MONDAY, WEDNESDAY AND FRIDAY 45 tablet 3   SYMBICORT 160-4.5 MCG/ACT inhaler USE 1 TO 2 INHALATIONS BY  MOUTH EVERY 12 HOURS AS  NEEDED 30.6 g 3   vardenafil (LEVITRA) 20 MG tablet TAKE 1 TABLET BY MOUTH AS DIRECTED. CAN NOT BE TAKEN WITH NITROGLYCERIN. NOT COVERED BY INSURANCE 6 tablet 2   zinc gluconate 50 MG tablet Take 50 mg by mouth daily.     famotidine (PEPCID) 20 MG tablet Take 1 tablet (20 mg total) by mouth daily. 30 tablet 0   No current facility-administered medications on file prior to visit.    Past Medical History:  Diagnosis Date   Arthritis    "thumbs" (07/25/2018)   Asthma    CHF (congestive heart failure) (HCC)    Chronic bronchitis (HCC)    Environmental allergies    GERD (gastroesophageal reflux disease)    Headache    "seasonal; w/environmental allergies" (07/25/2018)   History of blood transfusion    "when I had laminectomy" (07/25/2018)   HTN (hypertension)    Hyperlipidemia    LBBB (left bundle branch block) 1999   Metatarsal bone fracture 03/07/2018   Myocardial infarction [facility]    "was told I've had an old MI; probably in the 52s" (07/25/2018)   Pneumonia    "as a child, age 78; viral pneumonia 3 times in the last 10 years" (07/25/2018)   Presence of permanent cardiac pacemaker 07/25/2018   Seasonal allergies    Sleep apnea    "wife says I do" (07/25/2018)   Type II diabetes mellitus ([place])     Past Surgical History:  Procedure Laterality Date   BACK SURGERY     BIV PACEMAKER INSERTION CRT-P  07/25/2018   BIV PACEMAKER INSERTION CRT-P N/A 07/25/2018   Procedure: BIV PACEMAKER  INSERTION CRT-P;  Surgeon: Evans Lance, MD;  Location: [place] [facility];  Service: Cardiovascular;  Laterality: N/A;   CARDIAC CATHETERIZATION  2003   Dr Pernell Dupre; 85 % R circumflex obstruction   CARPAL TUNNEL RELEASE Left 10/11/2014   Procedure: LEFT CARPAL TUNNEL RELEASE;  Surgeon: Roseanne Kaufman, MD;  Location: [place];  Service: Orthopedics;  Laterality: Left;   COLONOSCOPY W/ BIOPSIES AND POLYPECTOMY  2018   INGUINAL HERNIA REPAIR Right 1948   INGUINAL HERNIA REPAIR Left 1988   LUMBAR LAMINECTOMY  1984   MINOR CARPAL TUNNEL Right 11/22/2014   Procedure: RIGHT LIMITED OPEN CARPAL TUNNEL RELEASE;  Surgeon: Roseanne Kaufman, MD;  Location: [place];  Service: Orthopedics;  Laterality:  Right;   TONSILLECTOMY  1958    Social History   Socioeconomic History   Marital status: Married    Spouse name: Not on file   Number of children: 3   Years of education: Not on file   Highest education level: Professional school degree (e.g., MD, DDS, DVM, JD)  Occupational History   Occupation: retired Pension scheme manager: [employer]  Tobacco Use   Smoking status: Former    Packs/day: 2.00    Years: 2.00    Pack years: 4.00    Types: Cigarettes    Quit date: 12/14/1963    Years since quitting: 57.7   Smokeless tobacco: Never  Vaping Use   Vaping Use: Never used  Substance and Sexual Activity   Alcohol use: Not Currently    Alcohol/week: 2.0 standard drinks    Types: 2 Glasses of wine per week    Comment: occasional   Drug use: Never   Sexual activity: Yes  Other Topics Concern   Not on file  Social History Narrative   Lives with wife in a 2 story home.  Has 3 daughters.  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     Social Determinants of Health   Financial Resource Strain: Low Risk    Difficulty of Paying Living Expenses: Not hard at all  Food Insecurity: No Food Insecurity   Worried About Charity fundraiser in the Last Year: Never  true   [place] in the Last Year: Never true  Transportation Needs: No Transportation Needs   Lack of Transportation (Medical): No   Lack of Transportation (Non-Medical): No  Physical Activity: Sufficiently Active   Days of Exercise per Week: 5 days   Minutes of Exercise per Session: 30 min  Stress: No Stress Concern Present   Feeling of Stress : Not at all  Social Connections: Socially Integrated   Frequency of Communication with Friends and Family: More than three times a week   Frequency of Social Gatherings with Friends and Family: More than three times a week   Attends Religious Services: More than 4 times per year   Active Member of [employer] or Organizations: Yes   Attends Music therapist: More than 4 times per year   Marital Status: Married    Family History  Problem Relation Age of Onset   Heart attack Father        46s   Colon polyps Father    Heart failure Mother        90s   Subarachnoid hemorrhage Mother 58   Hypertension Mother    Subarachnoid hemorrhage Paternal Grandfather 46   Diabetes Maternal Aunt    Colon cancer Neg Hx     Review of Systems  Constitutional:  Negative for fever.  Eyes:  Negative for visual disturbance.  Respiratory:  Negative for cough, shortness of breath and wheezing.   Cardiovascular:  Negative for chest pain, palpitations and leg swelling.  Neurological:  Positive for headaches (occ). Negative for dizziness and light-headedness.      Objective:   Vitals:   08/18/21 0901  BP: (!) 142/80  Pulse: 72  Temp: 97.9 F (36.6 C)  SpO2: 99%   BP Readings from Last 3 Encounters:  08/18/21 (!) 142/80  06/04/21 122/80  05/14/21 118/70   Wt Readings from Last 3 Encounters:  08/18/21 192 lb 9.6 oz (87.4 kg)  06/04/21 195 lb (88.5 kg)  05/14/21 194 lb (88 kg)   Body  mass index is 27.64 kg/m.   Physical Exam    Constitutional: Appears well-developed and well-nourished. No distress.  HENT:  Head: Normocephalic  and atraumatic.  Neck: Neck supple. No tracheal deviation present. No thyromegaly present.  No cervical lymphadenopathy Cardiovascular: Normal rate, regular rhythm and normal heart sounds.   No murmur heard. No carotid bruit .  No edema Pulmonary/Chest: Effort normal and breath sounds normal. No respiratory distress. No has no wheezes. No rales.  Skin: Skin is warm and dry. Not diaphoretic.  Psychiatric: Normal mood and affect. Behavior is normal.      Assessment & Plan:    See Problem List for Assessment and Plan of chronic medical problems.    This visit occurred during the SARS-CoV-2 public health emergency.  Safety protocols were in place, including screening questions prior to the visit, additional usage of staff PPE, and extensive cleaning of exam room while observing appropriate contact time as indicated for disinfecting solutions.

## 2021-08-18 ENCOUNTER — Encounter: Payer: Self-pay | Admitting: Gastroenterology

## 2021-08-18 ENCOUNTER — Encounter: Payer: Self-pay | Admitting: Internal Medicine

## 2021-08-18 ENCOUNTER — Other Ambulatory Visit: Payer: Self-pay

## 2021-08-18 ENCOUNTER — Ambulatory Visit (INDEPENDENT_AMBULATORY_CARE_PROVIDER_SITE_OTHER): Payer: Medicare Other | Admitting: Internal Medicine

## 2021-08-18 VITALS — BP 142/80 | HR 72 | Temp 97.9°F | Ht 70.0 in | Wt 192.6 lb

## 2021-08-18 DIAGNOSIS — E782 Mixed hyperlipidemia: Secondary | ICD-10-CM

## 2021-08-18 DIAGNOSIS — G479 Sleep disorder, unspecified: Secondary | ICD-10-CM

## 2021-08-18 DIAGNOSIS — M8949 Other hypertrophic osteoarthropathy, multiple sites: Secondary | ICD-10-CM | POA: Diagnosis not present

## 2021-08-18 DIAGNOSIS — E0842 Diabetes mellitus due to underlying condition with diabetic polyneuropathy: Secondary | ICD-10-CM | POA: Diagnosis not present

## 2021-08-18 DIAGNOSIS — I10 Essential (primary) hypertension: Secondary | ICD-10-CM

## 2021-08-18 DIAGNOSIS — G8929 Other chronic pain: Secondary | ICD-10-CM

## 2021-08-18 DIAGNOSIS — E1159 Type 2 diabetes mellitus with other circulatory complications: Secondary | ICD-10-CM | POA: Diagnosis not present

## 2021-08-18 DIAGNOSIS — Z1211 Encounter for screening for malignant neoplasm of colon: Secondary | ICD-10-CM | POA: Diagnosis not present

## 2021-08-18 DIAGNOSIS — I5042 Chronic combined systolic (congestive) and diastolic (congestive) heart failure: Secondary | ICD-10-CM

## 2021-08-18 DIAGNOSIS — E1142 Type 2 diabetes mellitus with diabetic polyneuropathy: Secondary | ICD-10-CM | POA: Diagnosis not present

## 2021-08-18 DIAGNOSIS — M159 Polyosteoarthritis, unspecified: Secondary | ICD-10-CM

## 2021-08-18 LAB — COMPREHENSIVE METABOLIC PANEL
ALT: 22 U/L (ref 0–53)
AST: 20 U/L (ref 0–37)
Albumin: 4.1 g/dL (ref 3.5–5.2)
Alkaline Phosphatase: 69 U/L (ref 39–117)
BUN: 18 mg/dL (ref 6–23)
CO2: 31 mEq/L (ref 19–32)
Calcium: 9.7 mg/dL (ref 8.4–10.5)
Chloride: 101 mEq/L (ref 96–112)
Creatinine, Ser: 1.09 mg/dL (ref 0.40–1.50)
GFR: 65.23 mL/min (ref >60.00)
Glucose, Bld: 115 mg/dL — ABNORMAL HIGH (ref 70–99)
Potassium: 4.7 mEq/L (ref 3.5–5.1)
Sodium: 138 mEq/L (ref 135–145)
Total Bilirubin: 0.5 mg/dL (ref 0.2–1.2)
Total Protein: 6.9 g/dL (ref 6.0–8.3)

## 2021-08-18 LAB — HEMOGLOBIN A1C: Hgb A1c MFr Bld: 6.8 % — ABNORMAL HIGH (ref 4.6–6.5)

## 2021-08-18 NOTE — Progress Notes (Signed)
Remote pacemaker transmission.   

## 2021-08-18 NOTE — Addendum Note (Signed)
Addended by: Jacobo Forest on: 08/18/2021 09:45 AM   Modules accepted: Orders

## 2021-08-18 NOTE — Assessment & Plan Note (Signed)
Chronic Continue crestor 10 mg TIW Regular exercise and healthy diet encouraged

## 2021-08-18 NOTE — Assessment & Plan Note (Signed)
Chronic Chronic pain from generalized OA in multiple joints, c-spine djd Pain overall controlled Continue norco 7.'5mg'$ -325 mg  -- 1 pill 2-3 times daily prn [place] controlled substance database checked.  He is taking his medication appropriately

## 2021-08-18 NOTE — Assessment & Plan Note (Signed)
Chronic Taking neurontin 200 mg tid - taking with hydrocodone - continue

## 2021-08-18 NOTE — Assessment & Plan Note (Signed)
Chronic  Euvolemic on exam

## 2021-08-18 NOTE — Assessment & Plan Note (Addendum)
Chronic Multiple joints Pain controlled Continue norco 7.5-325 mg 2-3 times/day prn - only takes when needed Taking Neurontin 200 mg tid He is taking the medication appropriately and this is controlling his pain w/o side effects

## 2021-08-18 NOTE — Assessment & Plan Note (Addendum)
Chronic Lab Results  Component Value Date   HGBA1C 7.0 (H) 02/16/2021   Sugars controlled at home Continue metformin 500 mg bid Check a1c today Regular exercise, diabetic diet stressed

## 2021-08-18 NOTE — Assessment & Plan Note (Signed)
Chronic BP well controlled Continue bystolic 5 mg qd cmp

## 2021-08-18 NOTE — Assessment & Plan Note (Signed)
Chronic Controlled Continue ativan 0.5 mg - 1 mg HS prn

## 2021-08-22 ENCOUNTER — Other Ambulatory Visit: Payer: Self-pay | Admitting: Family Medicine

## 2021-08-22 ENCOUNTER — Other Ambulatory Visit: Payer: Self-pay | Admitting: Internal Medicine

## 2021-08-23 ENCOUNTER — Other Ambulatory Visit: Payer: Self-pay | Admitting: Internal Medicine

## 2021-08-24 ENCOUNTER — Other Ambulatory Visit: Payer: Self-pay | Admitting: Family Medicine

## 2021-08-24 MED ORDER — HYDROCODONE-ACETAMINOPHEN 7.5-325 MG PO TABS: 1.0000 | ORAL_TABLET | Freq: Four times a day (QID) | ORAL | 0 refills | Status: DC | PRN

## 2021-08-27 NOTE — Progress Notes (Deleted)
Electrophysiology Office Note Date: 08/27/2021  ID:  Jeffery [name], DOB Jan 02, 1941, MRN [id number]  PCP: [name], MD Primary Cardiologist: Sinclair Grooms, MD Electrophysiologist: Cristopher Peru, MD   CC: Pacemaker follow-up  Jeffery [name] is a 80 y.o. male seen today for Cristopher Peru, MD for routine electrophysiology followup.  Since last being seen in our clinic the patient reports doing ***.  he denies chest pain, palpitations, dyspnea, PND, orthopnea, nausea, vomiting, dizziness, syncope, edema, weight gain, or early satiety.  Device History: SJM CRT-P implanted 07/25/2018  Past Medical History:  Diagnosis Date   Arthritis    "thumbs" (07/25/2018)   Asthma    CHF (congestive heart failure) (HCC)    Chronic bronchitis (HCC)    Environmental allergies    GERD (gastroesophageal reflux disease)    Headache    "seasonal; w/environmental allergies" (07/25/2018)   History of blood transfusion    "when I had laminectomy" (07/25/2018)   HTN (hypertension)    Hyperlipidemia    LBBB (left bundle branch block) 1999   Metatarsal bone fracture 03/07/2018   Myocardial infarction [facility])    "was told I've had an old MI; probably in the 54s" (07/25/2018)   Pneumonia    "as a child, age 37; viral pneumonia 3 times in the last 10 years" (07/25/2018)   Presence of permanent cardiac pacemaker 07/25/2018   Seasonal allergies    Sleep apnea    "wife says I do" (07/25/2018)   Type II diabetes mellitus ([place])    Past Surgical History:  Procedure Laterality Date   BACK SURGERY     BIV PACEMAKER INSERTION CRT-P  07/25/2018   BIV PACEMAKER INSERTION CRT-P N/A 07/25/2018   Procedure: BIV PACEMAKER INSERTION CRT-P;  Surgeon: Evans Lance, MD;  Location: [place] [facility];  Service: Cardiovascular;  Laterality: N/A;   CARDIAC CATHETERIZATION  2003   Dr Pernell Dupre; 85 % R circumflex obstruction   CARPAL TUNNEL RELEASE Left 10/11/2014   Procedure: LEFT CARPAL TUNNEL RELEASE;   Surgeon: Roseanne Kaufman, MD;  Location: [place];  Service: Orthopedics;  Laterality: Left;   COLONOSCOPY W/ BIOPSIES AND POLYPECTOMY  2018   INGUINAL HERNIA REPAIR Right 1948   INGUINAL HERNIA REPAIR Left 1988   LUMBAR LAMINECTOMY  1984   MINOR CARPAL TUNNEL Right 11/22/2014   Procedure: RIGHT LIMITED OPEN CARPAL TUNNEL RELEASE;  Surgeon: Roseanne Kaufman, MD;  Location: [place];  Service: Orthopedics;  Laterality: Right;   TONSILLECTOMY  1958    Current Outpatient Medications  Medication Sig Dispense Refill   ACCU-CHEK AVIVA PLUS test strip USE AS INSTRUCTED TO TEST BLOOD SUGAR 6 TIMES DAILY. DX CODE: E11.59 500 strip 4   ACCU-CHEK SOFTCLIX LANCETS lancets Use to test blood sugar daily as directed. 100 each 3   albuterol (PROAIR HFA) 108 (90 Base) MCG/ACT inhaler Inhale 2 puffs into the lungs every 6 (six) hours as needed for wheezing or shortness of breath. 6.7 g 11   albuterol (PROVENTIL) (2.5 MG/3ML) 0.083% nebulizer solution TAKE 3 MLS BY NEBULIZATION EVERY 6 (SIX) HOURS AS NEEDED FOR WHEEZING OR SHORTNESS OF BREATH. 150 mL 5   aspirin 81 MG tablet Take 81 mg by mouth at bedtime.     celecoxib (CELEBREX) 100 MG capsule TAKE 1 CAPSULE BY MOUTH EVERY DAY 90 capsule 0   Continuous Blood Gluc Receiver (FREESTYLE LIBRE 14 DAY READER) DEVI 1 Device by Does not apply route every 14 (fourteen) days.  1 Device 0   Continuous Blood Gluc Sensor (FREESTYLE LIBRE 14 DAY SENSOR) MISC APPLY 1 SENSOR EVERY 14 DAYS 2 each 2   famotidine (PEPCID) 20 MG tablet Take 1 tablet (20 mg total) by mouth daily. 30 tablet 0   fexofenadine (ALLEGRA) 180 MG tablet Take 180 mg by mouth daily as needed for allergies.      gabapentin (NEURONTIN) 100 MG capsule Take 2 capsules (200 mg total) by mouth 3 (three) times daily. 180 capsule 3   HYDROcodone-acetaminophen (NORCO) 7.5-325 MG tablet Take 1 tablet by mouth every 6 (six) hours as needed for severe pain (for chronic joint pain, neck  pain). 90 tablet 0   ipratropium (ATROVENT) 0.02 % nebulizer solution Take 2.5 mLs (0.5 mg total) by nebulization every 6 (six) hours. (Patient taking differently: Take 0.5 mg by nebulization every 6 (six) hours as needed.) 75 mL 3   levalbuterol (XOPENEX) 1.25 MG/3ML nebulizer solution Take 1.25 mg by nebulization every 6 (six) hours. (Patient taking differently: Take 1.25 mg by nebulization every 6 (six) hours as needed.) 120 mL 3   LORazepam (ATIVAN) 1 MG tablet TAKE 1/2 TO 1 TABLET BY MOUTH DAILY AS NEEDED 30 tablet 0   metFORMIN (GLUCOPHAGE) 500 MG tablet Take 1 tablet (500 mg total) by mouth 2 (two) times daily with a meal. 120 tablet 2   montelukast (SINGULAIR) 10 MG tablet TAKE 1 TABLET BY MOUTH AT  BEDTIME 90 tablet 3   Multiple Vitamin (MULTIVITAMIN WITH MINERALS) TABS tablet Take 1 tablet by mouth daily.     nebivolol (BYSTOLIC) 5 MG tablet Take 1 tablet (5 mg total) by mouth daily. Please keep upcoming appointment for further refills 90 tablet 2   nitroGLYCERIN (NITROSTAT) 0.4 MG SL tablet PLACE 1 TABLET (0.4 MG TOTAL) UNDER THE TONGUE EVERY 5 (FIVE) MINUTES X 3 DOSES AS NEEDED FOR CHEST PAIN. 75 tablet 1   Respiratory Therapy Supplies (FLUTTER) DEVI Twice a day and prn as needed, may increase if feeling worse 1 each 0   rosuvastatin (CRESTOR) 10 MG tablet TAKE ONE TABLET BY MOUTH DAILY ON MONDAY, WEDNESDAY AND FRIDAY 45 tablet 3   SYMBICORT 160-4.5 MCG/ACT inhaler USE 1 TO 2 INHALATIONS BY  MOUTH EVERY 12 HOURS AS  NEEDED 30.6 g 3   vardenafil (LEVITRA) 20 MG tablet TAKE 1 TABLET BY MOUTH AS DIRECTED. CAN NOT BE TAKEN WITH NITROGLYCERIN. NOT COVERED BY INSURANCE 6 tablet 2   zinc gluconate 50 MG tablet Take 50 mg by mouth daily.     No current facility-administered medications for this visit.    Allergies:   Contrast media [iodinated diagnostic agents]   Social History: Social History   Socioeconomic History   Marital status: Married    Spouse name: Not on file   Number of  children: 3   Years of education: Not on file   Highest education level: Professional school degree (e.g., MD, DDS, DVM, JD)  Occupational History   Occupation: retired Pension scheme manager: [employer]  Tobacco Use   Smoking status: Former    Packs/day: 2.00    Years: 2.00    Pack years: 4.00    Types: Cigarettes    Quit date: 12/14/1963    Years since quitting: 57.7   Smokeless tobacco: Never  Vaping Use   Vaping Use: Never used  Substance and Sexual Activity   Alcohol use: Not Currently    Alcohol/week: 2.0 standard drinks    Types: 2 Glasses of  wine per week    Comment: occasional   Drug use: Never   Sexual activity: Yes  Other Topics Concern   Not on file  Social History Narrative   Lives with wife in a 2 story home.  Has 3 daughters.  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     Social Determinants of Health   Financial Resource Strain: Low Risk    Difficulty of Paying Living Expenses: Not hard at all  Food Insecurity: No Food Insecurity   Worried About Charity fundraiser in the Last Year: Never true   [place] in the Last Year: Never true  Transportation Needs: No Transportation Needs   Lack of Transportation (Medical): No   Lack of Transportation (Non-Medical): No  Physical Activity: Sufficiently Active   Days of Exercise per Week: 5 days   Minutes of Exercise per Session: 30 min  Stress: No Stress Concern Present   Feeling of Stress : Not at all  Social Connections: Socially Integrated   Frequency of Communication with Friends and Family: More than three times a week   Frequency of Social Gatherings with Friends and Family: More than three times a week   Attends Religious Services: More than 4 times per year   Active Member of [employer] or Organizations: Yes   Attends Music therapist: More than 4 times per year   Marital Status: Married  Human resources officer Violence: Not At Risk   Fear of Current or Ex-Partner: No   Emotionally Abused:  No   Physically Abused: No   Sexually Abused: No    Family History: Family History  Problem Relation Age of Onset   Heart attack Father        59s   Colon polyps Father    Heart failure Mother        90s   Subarachnoid hemorrhage Mother 21   Hypertension Mother    Subarachnoid hemorrhage Paternal Grandfather 32   Diabetes Maternal Aunt    Colon cancer Neg Hx      Review of Systems: All other systems reviewed and are otherwise negative except as noted above.  Physical Exam: There were no vitals filed for this visit.   GEN- The patient is well appearing, alert and oriented x 3 today.   HEENT: normocephalic, atraumatic; sclera clear, conjunctiva pink; hearing intact; oropharynx clear; neck supple  Lungs- Clear to ausculation bilaterally, normal work of breathing.  No wheezes, rales, rhonchi Heart- Regular rate and rhythm, no murmurs, rubs or gallops  GI- soft, non-tender, non-distended, bowel sounds present  Extremities- no clubbing or cyanosis. No edema MS- no significant deformity or atrophy Skin- warm and dry, no rash or lesion; PPM pocket well healed Psych- euthymic mood, full affect Neuro- strength and sensation are intact  PPM Interrogation- reviewed in detail today,  See PACEART report  EKG:  EKG {ACTION; IS/IS [id number] ordered today. Personal review of ekg ordered {Blank single:19197::"today","***"} shows ***   Recent Labs: 02/16/2021: Hemoglobin 14.2; Platelets 248.0; TSH 1.64 08/18/2021: ALT 22; BUN 18; Creatinine, Ser 1.09; Potassium 4.7; Sodium 138   Wt Readings from Last 3 Encounters:  08/18/21 192 lb 9.6 oz (87.4 kg)  06/04/21 195 lb (88.5 kg)  05/14/21 194 lb (88 kg)     Other studies Reviewed: Additional studies/ records that were reviewed today include: Previous EP office notes, Previous remote checks, Most recent labwork.   Assessment and Plan:  1. Second Degree AV block s/p St. Jude CRT-P Normal PPM  function See Claudia Desanctis Art report No changes  today  He has had A and V lead noise. Some have been clearly EMI others are larger brief intermittent noise and seen on BOTH leads (A lead signal is large though noted on both) RV lead sensitivity previously adjusted to 58m given device dependent Phrenic/diaphragmatic stim only when in a stooped and reaching type position, no LV lead programming changes were made.  Discussed avoiding this position   2. NICM Volume status *** NYHA symptoms On bystolic 5 mg  Echo [id number] 40-45%. He is not on GDMT. Will update echo to help guide.    3. CAD (mod OM branch disease by cath in 2002), known LBBB Denies s/s ischemia Continue ASA, BB, and statin tx   Uncertain what his symtom while mowing the lawn was He has not had an device noted V noise or V reversions, device is functioning appropriately He does not have recurrent symptoms He wonders about ectopy at the time He V paces 99%, so PVC burden is low overall, no HVR episodes or AF Discussed possible angina, but this is a one time eveny to date He will monitor and reach out to Dr. STamala Julianif any recurrent symptoms   4. HTN Stable on current regimen    Current medicines are reviewed at length with the patient today.   The patient {ACTIONS; HAS/DOES NOT HAVE:19233} concerns regarding his medicines.  The following changes were made today:  {NONE DEFAULTED:18576}  Labs/ tests ordered today include: *** No orders of the defined types were placed in this encounter.    Disposition:   Follow up with {Blank single:19197::"Dr. Allred","Dr. TArlan Organ Klein","Dr. Camnitz","Dr. Lambert","EP APP"} in *** {Blank single:19197::"Months","Weeks"}    Signed, MShirley Friar PA-C  08/27/2021 8:01 AM  [facility] [address]G[place] [phone](office) ([phone](fax)

## 2021-08-28 ENCOUNTER — Encounter: Payer: Medicare Other | Admitting: Student

## 2021-08-28 DIAGNOSIS — I2510 Atherosclerotic heart disease of native coronary artery without angina pectoris: Secondary | ICD-10-CM

## 2021-08-28 DIAGNOSIS — I428 Other cardiomyopathies: Secondary | ICD-10-CM

## 2021-08-28 DIAGNOSIS — I5022 Chronic systolic (congestive) heart failure: Secondary | ICD-10-CM

## 2021-08-28 DIAGNOSIS — I441 Atrioventricular block, second degree: Secondary | ICD-10-CM

## 2021-08-30 ENCOUNTER — Other Ambulatory Visit: Payer: Self-pay | Admitting: Internal Medicine

## 2021-08-31 MED ORDER — LORAZEPAM 1 MG PO TABS: ORAL_TABLET | ORAL | 5 refills | Status: DC

## 2021-09-01 ENCOUNTER — Other Ambulatory Visit: Payer: Self-pay | Admitting: Internal Medicine

## 2021-09-02 NOTE — Progress Notes (Signed)
Electrophysiology Office Note Date: 09/03/2021  ID:  Jeffery [name], DOB [birth year], MRN [id number]  PCP: [name], MD Primary Cardiologist: Sinclair Grooms, MD Electrophysiologist: Cristopher Peru, MD   CC: Pacemaker follow-up  Jeffery [name] is a 80 y.o. male seen today for Cristopher Peru, MD for routine electrophysiology followup.  Since last being seen in our clinic the patient reports doing very well. 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 Noise reversion noted recently prompting device clinic call. [employer] is investigating and potentially will travel out to his property to search for specific areas of interference. He plans on getting a "shield" from [place], used by tradeworkers to shield their devices from EMI.  he denies chest pain, palpitations, dyspnea, PND, orthopnea, nausea, vomiting, dizziness, syncope, edema, weight gain, or early satiety.  Device History: SJM CRT-P implanted 07/25/2018  Past Medical History:  Diagnosis Date   Arthritis    "thumbs" (07/25/2018)   Asthma    CHF (congestive heart failure) (HCC)    Chronic bronchitis (HCC)    Environmental allergies    GERD (gastroesophageal reflux disease)    Headache    "seasonal; w/environmental allergies" (07/25/2018)   History of blood transfusion    "when I had laminectomy" (07/25/2018)   HTN (hypertension)    Hyperlipidemia    LBBB (left bundle branch block) 1999   Metatarsal bone fracture 03/07/2018   Myocardial infarction [facility]    "was told I've had an old MI; probably in the 30s" (07/25/2018)   Pneumonia    "as a child, age 65; viral pneumonia 3 times in the last 10 years" (07/25/2018)   Presence of permanent cardiac pacemaker 07/25/2018   Seasonal allergies    Sleep apnea    "wife says I do" (07/25/2018)   Type II diabetes mellitus ([place])    Past Surgical History:  Procedure Laterality Date   BACK SURGERY     BIV PACEMAKER INSERTION CRT-P  07/25/2018   BIV  PACEMAKER INSERTION CRT-P N/A 07/25/2018   Procedure: BIV PACEMAKER INSERTION CRT-P;  Surgeon: Evans Lance, MD;  Location: [place] [facility];  Service: Cardiovascular;  Laterality: N/A;   CARDIAC CATHETERIZATION  2003   Dr Pernell Dupre; 85 % R circumflex obstruction   CARPAL TUNNEL RELEASE Left 10/11/2014   Procedure: LEFT CARPAL TUNNEL RELEASE;  Surgeon: Roseanne Kaufman, MD;  Location: [place];  Service: Orthopedics;  Laterality: Left;   COLONOSCOPY W/ BIOPSIES AND POLYPECTOMY  2018   INGUINAL HERNIA REPAIR Right 1948   INGUINAL HERNIA REPAIR Left 1988   LUMBAR LAMINECTOMY  1984   MINOR CARPAL TUNNEL Right 11/22/2014   Procedure: RIGHT LIMITED OPEN CARPAL TUNNEL RELEASE;  Surgeon: Roseanne Kaufman, MD;  Location: [place];  Service: Orthopedics;  Laterality: Right;   TONSILLECTOMY  1958    Current Outpatient Medications  Medication Sig Dispense Refill   ACCU-CHEK AVIVA PLUS test strip USE AS INSTRUCTED TO TEST BLOOD SUGAR 6 TIMES DAILY. DX CODE: E11.59 500 strip 4   ACCU-CHEK SOFTCLIX LANCETS lancets Use to test blood sugar daily as directed. 100 each 3   albuterol (PROAIR HFA) 108 (90 Base) MCG/ACT inhaler Inhale 2 puffs into the lungs every 6 (six) hours as needed for wheezing or shortness of breath. 6.7 g 11   albuterol (PROVENTIL) (2.5 MG/3ML) 0.083% nebulizer solution TAKE 3 MLS BY NEBULIZATION EVERY 6 (SIX) HOURS AS NEEDED FOR WHEEZING OR SHORTNESS OF BREATH. 150  mL 5   aspirin 81 MG tablet Take 81 mg by mouth at bedtime.     celecoxib (CELEBREX) 100 MG capsule TAKE 1 CAPSULE BY MOUTH EVERY DAY 90 capsule 0   Continuous Blood Gluc Receiver (FREESTYLE LIBRE 14 DAY READER) DEVI 1 Device by Does not apply route every 14 (fourteen) days. 1 Device 0   Continuous Blood Gluc Sensor (FREESTYLE LIBRE 14 DAY SENSOR) MISC APPLY 1 SENSOR EVERY 14 DAYS 2 each 2   famotidine (PEPCID) 20 MG tablet Take 1 tablet (20 mg total) by mouth daily. 30 tablet 0    fexofenadine (ALLEGRA) 180 MG tablet Take 180 mg by mouth daily as needed for allergies.      gabapentin (NEURONTIN) 100 MG capsule Take 2 capsules (200 mg total) by mouth 3 (three) times daily. 180 capsule 3   HYDROcodone-acetaminophen (NORCO) 7.5-325 MG tablet Take 1 tablet by mouth every 6 (six) hours as needed for severe pain (for chronic joint pain, neck pain). 90 tablet 0   ipratropium (ATROVENT) 0.02 % nebulizer solution Take 2.5 mLs (0.5 mg total) by nebulization every 6 (six) hours. (Patient taking differently: Take 0.5 mg by nebulization every 6 (six) hours as needed.) 75 mL 3   levalbuterol (XOPENEX) 1.25 MG/3ML nebulizer solution Take 1.25 mg by nebulization every 6 (six) hours. (Patient taking differently: Take 1.25 mg by nebulization every 6 (six) hours as needed.) 120 mL 3   LORazepam (ATIVAN) 1 MG tablet TAKE 1/2 TO 1 TABLET BY MOUTH DAILY AS NEEDED 30 tablet 5   metFORMIN (GLUCOPHAGE) 500 MG tablet Take 1 tablet (500 mg total) by mouth 2 (two) times daily with a meal. 120 tablet 2   montelukast (SINGULAIR) 10 MG tablet TAKE 1 TABLET BY MOUTH AT  BEDTIME 90 tablet 3   Multiple Vitamin (MULTIVITAMIN WITH MINERALS) TABS tablet Take 1 tablet by mouth daily.     nebivolol (BYSTOLIC) 5 MG tablet Take 1 tablet (5 mg total) by mouth daily. Please keep upcoming appointment for further refills 90 tablet 2   nitroGLYCERIN (NITROSTAT) 0.4 MG SL tablet PLACE 1 TABLET (0.4 MG TOTAL) UNDER THE TONGUE EVERY 5 (FIVE) MINUTES X 3 DOSES AS NEEDED FOR CHEST PAIN. 75 tablet 1   Respiratory Therapy Supplies (FLUTTER) DEVI Twice a day and prn as needed, may increase if feeling worse 1 each 0   rosuvastatin (CRESTOR) 10 MG tablet TAKE ONE TABLET BY MOUTH DAILY ON MONDAY, WEDNESDAY AND FRIDAY 45 tablet 3   SYMBICORT 160-4.5 MCG/ACT inhaler USE 1 TO 2 INHALATIONS BY  MOUTH EVERY 12 HOURS AS  NEEDED 30.6 g 3   vardenafil (LEVITRA) 20 MG tablet TAKE 1 TABLET BY MOUTH AS DIRECTED. CAN NOT BE TAKEN WITH  NITROGLYCERIN. NOT COVERED BY INSURANCE 6 tablet 2   zinc gluconate 50 MG tablet Take 50 mg by mouth daily.     PFIZER-BIONT COVID-19 VAC-TRIS SUSP injection Inject 0.3 mLs into the muscle once.     No current facility-administered medications for this visit.    Allergies:   Contrast media [iodinated diagnostic agents]   Social History: Social History   Socioeconomic History   Marital status: Married    Spouse name: Not on file   Number of children: 3   Years of education: Not on file   Highest education level: Professional school degree (e.g., MD, DDS, DVM, JD)  Occupational History   Occupation: retired Pension scheme manager: [employer]  Tobacco Use   Smoking status: Former  Packs/day: 2.00    Years: 2.00    Pack years: 4.00    Types: Cigarettes    Quit date: 12/14/1963    Years since quitting: 57.7   Smokeless tobacco: Never  Vaping Use   Vaping Use: Never used  Substance and Sexual Activity   Alcohol use: Not Currently    Alcohol/week: 2.0 standard drinks    Types: 2 Glasses of wine per week    Comment: occasional   Drug use: Never   Sexual activity: Yes  Other Topics Concern   Not on file  Social History Narrative   Lives with wife in a 2 story home.  Has 3 daughters.  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     Social Determinants of Health   Financial Resource Strain: Low Risk    Difficulty of Paying Living Expenses: Not hard at all  Food Insecurity: No Food Insecurity   Worried About Charity fundraiser in the Last Year: Never true   [place] in the Last Year: Never true  Transportation Needs: No Transportation Needs   Lack of Transportation (Medical): No   Lack of Transportation (Non-Medical): No  Physical Activity: Sufficiently Active   Days of Exercise per Week: 5 days   Minutes of Exercise per Session: 30 min  Stress: No Stress Concern Present   Feeling of Stress : Not at all  Social Connections: Socially Integrated   Frequency of  Communication with Friends and Family: More than three times a week   Frequency of Social Gatherings with Friends and Family: More than three times a week   Attends Religious Services: More than 4 times per year   Active Member of [employer] or Organizations: Yes   Attends Music therapist: More than 4 times per year   Marital Status: Married  Human resources officer Violence: Not At Risk   Fear of Current or Ex-Partner: No   Emotionally Abused: No   Physically Abused: No   Sexually Abused: No    Family History: Family History  Problem Relation Age of Onset   Heart attack Father        26s   Colon polyps Father    Heart failure Mother        90s   Subarachnoid hemorrhage Mother 3   Hypertension Mother    Subarachnoid hemorrhage Paternal Grandfather 82   Diabetes Maternal Aunt    Colon cancer Neg Hx      Review of Systems: All other systems reviewed and are otherwise negative except as noted above.  Physical Exam: Vitals:   09/03/21 0926  BP: 132/74  Pulse: 66  Weight: 196 lb (88.9 kg)  Height: 5' 9.5" (1.765 m)     GEN- The patient is well appearing, alert and oriented x 3 today.   HEENT: normocephalic, atraumatic; sclera clear, conjunctiva pink; hearing intact; oropharynx clear; neck supple  Lungs- Clear to ausculation bilaterally, normal work of breathing.  No wheezes, rales, rhonchi Heart- Regular rate and rhythm, no murmurs, rubs or gallops  GI- soft, non-tender, non-distended, bowel sounds present  Extremities- no clubbing or cyanosis. No edema MS- no significant deformity or atrophy Skin- warm and dry, no rash or lesion; PPM pocket well healed Psych- euthymic mood, full affect Neuro- strength and sensation are intact  PPM Interrogation- reviewed in detail today,  See PACEART report  EKG:  EKG is ordered today. Personal review of ekg ordered today shows LV pacing at 66 bpm, upright V1 and Lead I  Recent Labs: 02/16/2021: Hemoglobin 14.2; Platelets  248.0; TSH 1.64 08/18/2021: ALT 22; BUN 18; Creatinine, Ser 1.09; Potassium 4.7; Sodium 138   Wt Readings from Last 3 Encounters:  09/03/21 196 lb (88.9 kg)  08/18/21 192 lb 9.6 oz (87.4 kg)  06/04/21 195 lb (88.5 kg)     Other studies Reviewed: Additional studies/ records that were reviewed today include: Previous EP office notes, Previous remote checks, Most recent labwork.   Assessment and Plan:  1. Second Degree AV block s/p [employer] CRT-P Normal PPM function See Claudia Desanctis Art report No changes today  He has had A and V lead noise. Some have been clearly EMI others are larger brief intermittent noise and seen on BOTH leads (A lead signal is large though noted on both) RV lead sensitivity previously adjusted to 6mV given device dependent Phrenic/diaphragmatic stim only when in a stooped and reaching type position, no LV lead programming changes were made.  Discussed avoiding this position   2. NICM Volume status stable. NYHA I symptoms On bystolic 5 mg  Echo 02/3824 LVEF 40-45%.  He sees Dr. Tamala Julian November.   3. CAD (mod OM branch disease by cath in 2002), known LBBB Denies s/s ischemia Continue ASA, BB, and statin tx    4. HTN Stable on current regimen    Current medicines are reviewed at length with the patient today.     Labs stable 08/18/2021   Disposition:   Follow up with Dr. Lovena Le annually with stable remotes.     Jacalyn Lefevre, PA-C  09/03/2021 9:46 AM  [facility] [address] [place] [address] [phone] (office) [phone] (fax)

## 2021-09-03 ENCOUNTER — Ambulatory Visit (INDEPENDENT_AMBULATORY_CARE_PROVIDER_SITE_OTHER): Payer: Medicare Other | Admitting: Student

## 2021-09-03 ENCOUNTER — Encounter: Payer: Self-pay | Admitting: Student

## 2021-09-03 ENCOUNTER — Other Ambulatory Visit: Payer: Self-pay

## 2021-09-03 VITALS — BP 132/74 | HR 66 | Ht 69.5 in | Wt 196.0 lb

## 2021-09-03 DIAGNOSIS — I2510 Atherosclerotic heart disease of native coronary artery without angina pectoris: Secondary | ICD-10-CM

## 2021-09-03 DIAGNOSIS — I428 Other cardiomyopathies: Secondary | ICD-10-CM | POA: Diagnosis not present

## 2021-09-03 DIAGNOSIS — I447 Left bundle-branch block, unspecified: Secondary | ICD-10-CM

## 2021-09-03 DIAGNOSIS — I441 Atrioventricular block, second degree: Secondary | ICD-10-CM | POA: Diagnosis not present

## 2021-09-03 LAB — CUP PACEART INCLINIC DEVICE CHECK
Date Time Interrogation Session: 20220922095608
Implantable Lead Implant Date: 20190813
Implantable Lead Implant Date: 20190813
Implantable Lead Implant Date: 20190813
Implantable Lead Location: 753858
Implantable Lead Location: 753859
Implantable Lead Location: 753860
Implantable Pulse Generator Implant Date: 20190813
Pulse Gen Model: 3562
Pulse Gen Serial Number: 9043864

## 2021-09-03 NOTE — Patient Instructions (Signed)
Medication Instructions:  Your physician recommends that you continue on your current medications as directed. Please refer to the Current Medication list given to you today.  *If you need a refill on your cardiac medications before your next appointment, please call your pharmacy*   Lab Work: None If you have labs (blood work) drawn today and your tests are completely normal, you will receive your results only by: MyChart Message (if you have MyChart) OR A paper copy in the mail If you have any lab test that is abnormal or we need to change your treatment, we will call you to review the results.   Follow-Up: At [facility], you and your health needs are our priority.  As part of our continuing mission to provide you with exceptional heart care, we have created designated Provider Care Teams.  These Care Teams include your primary Cardiologist (physician) and Advanced Practice Providers (APPs -  Physician Assistants and Nurse Practitioners) who all work together to provide you with the care you need, when you need it.  Your next appointment:   1 year(s)  The format for your next appointment:   In Person  Provider:   You may see Gregg Taylor, MD or one of the following Advanced Practice Providers on your designated Care Team:   Renee Ursuy, PA-C Michael "Andy" Tillery, PA-C   

## 2021-09-14 ENCOUNTER — Ambulatory Visit (INDEPENDENT_AMBULATORY_CARE_PROVIDER_SITE_OTHER): Payer: Medicare Other | Admitting: Pharmacist

## 2021-09-14 ENCOUNTER — Other Ambulatory Visit: Payer: Self-pay

## 2021-09-14 ENCOUNTER — Other Ambulatory Visit: Payer: Self-pay | Admitting: Internal Medicine

## 2021-09-14 DIAGNOSIS — I10 Essential (primary) hypertension: Secondary | ICD-10-CM

## 2021-09-14 DIAGNOSIS — J45909 Unspecified asthma, uncomplicated: Secondary | ICD-10-CM

## 2021-09-14 DIAGNOSIS — E1142 Type 2 diabetes mellitus with diabetic polyneuropathy: Secondary | ICD-10-CM

## 2021-09-14 DIAGNOSIS — I25118 Atherosclerotic heart disease of native coronary artery with other forms of angina pectoris: Secondary | ICD-10-CM

## 2021-09-14 DIAGNOSIS — M542 Cervicalgia: Secondary | ICD-10-CM

## 2021-09-14 DIAGNOSIS — E1159 Type 2 diabetes mellitus with other circulatory complications: Secondary | ICD-10-CM

## 2021-09-14 DIAGNOSIS — E782 Mixed hyperlipidemia: Secondary | ICD-10-CM

## 2021-09-14 DIAGNOSIS — F419 Anxiety disorder, unspecified: Secondary | ICD-10-CM

## 2021-09-14 DIAGNOSIS — I5042 Chronic combined systolic (congestive) and diastolic (congestive) heart failure: Secondary | ICD-10-CM

## 2021-09-14 NOTE — Progress Notes (Signed)
Chronic Care Management Pharmacy Note  09/14/2021 Name:  Jeffery [name] MRN:  [id number] DOB:  [birth year]  Summary: -Pt reports neck pain is persistent but relatively controlled with current regimen of gabapentin, norco and celecoxib -Pt is planning to schedule visit with GI for colon cancer screening  Recommendations/Changes made from today's visit: -No med changes   Subjective: Jeffery [name] is an 80 y.o. year old male who is a primary patient of Burns, Claudina Lick, MD.  The CCM team was consulted for assistance with disease management and care coordination needs.    Engaged with patient by telephone for follow up visit in response to provider referral for pharmacy case management and/or care coordination services.   Consent to Services:  The patient was given information about Chronic Care Management services, agreed to services, and gave verbal consent prior to initiation of services.  Please see initial visit note for detailed documentation.   Patient Care Team: [name], MD as PCP - General (Internal Medicine) Belva Crome, MD as PCP - Cardiology (Cardiology) Evans Lance, MD as PCP - Electrophysiology (Cardiology) [place], [place] (Neurosurgery) Charlton Haws, [facility] as Pharmacist (Pharmacist)  Pt was a CRNA anesthesiology, retired since 2013. Daughter and 2 grandchildren live with them, he and his wife are "full time parents"  Recent office visits: 08/18/21 Dr Quay Burow OV: chronic f/u; no med changes. Referred for colonoscopy.  02/16/21 Dr Quay Burow OV: CPE. C/o neck pain. Refilled Norco, re-signed pain contract.  Recent consult visits: 09/03/21 PA Tillergy (cardiology): EP f/u; no med changes  06/04/21 [name] DO Sports Medicine(Neck pain): change Gabapentin 200 mg 3 times daily PT for neck pain  11/12/20 Dr Zada Finders (neurosurgery): spinal consult, not pursuing surgery at this time unless he develops nerve loss.   10/23/20  Dr Tamala Julian (sports med): f/u neck pain. Rx'd celecoxib, referred to neurosurgery.  Hospital visits: None in previous 6 months  Objective:  Lab Results  Component Value Date   CREATININE 1.09 08/18/2021   BUN 18 08/18/2021   GFR 65.23 08/18/2021   GFRNONAA 62 08/20/2020   GFRAA 72 08/20/2020   NA 138 08/18/2021   K 4.7 08/18/2021   CALCIUM 9.7 08/18/2021   CO2 31 08/18/2021   GLUCOSE 115 (H) 08/18/2021    Lab Results  Component Value Date/Time   HGBA1C 6.8 (H) 08/18/2021 09:45 AM   HGBA1C 7.0 (H) 02/16/2021 10:10 AM   GFR 65.23 08/18/2021 09:45 AM   GFR 68.47 02/16/2021 10:10 AM   MICROALBUR <0.7 02/16/2021 10:10 AM   MICROALBUR 1.5 08/20/2020 11:44 AM    Last diabetic Eye exam: No results found for: HMDIABEYEEXA  Last diabetic Foot exam: No results found for: HMDIABFOOTEX   Lab Results  Component Value Date   CHOL 113 02/16/2021   HDL 34.50 (L) 02/16/2021   LDLCALC 57 02/16/2021   TRIG 109.0 02/16/2021   CHOLHDL 3 02/16/2021    Hepatic Function Latest Ref Rng & Units 08/18/2021 02/16/2021 08/20/2020  Total Protein 6.0 - 8.3 g/dL 6.9 6.7 6.5  Albumin 3.5 - 5.2 g/dL 4.1 4.1 -  AST 0 - 37 U/L 20 23 26   ALT 0 - 53 U/L 22 22 23   Alk Phosphatase 39 - 117 U/L 69 70 -  Total Bilirubin 0.2 - 1.2 mg/dL 0.5 0.4 0.4  Bilirubin, Direct 0.00 - 0.40 mg/dL - - -    Lab Results  Component Value Date/Time   TSH 1.64 02/16/2021 10:10 AM  TSH 0.81 01/19/2019 09:41 AM    CBC Latest Ref Rng & Units 02/16/2021 08/20/2020 12/11/2018  WBC 4.0 - 10.5 K/uL 6.8 8.3 8.4  Hemoglobin 13.0 - 17.0 g/dL 14.2 15.0 12.3(L)  Hematocrit 39.0 - 52.0 % 41.9 46.7 38.9(L)  Platelets 150.0 - 400.0 K/uL 248.0 265 190    No results found for: VD25OH  Clinical ASCVD: No  The ASCVD Risk score (Arnett DK, et al., 2019) failed to calculate for the following reasons:   The valid total cholesterol range is 130 to 320 mg/dL    Depression screen [facility] 2/9 05/14/2021 03/27/2020 08/01/2019  Decreased Interest 0 0 0   Down, Depressed, Hopeless 0 0 0  PHQ - 2 Score 0 0 0  Some recent data might be hidden     Social History   Tobacco Use  Smoking Status Former   Packs/day: 2.00   Years: 2.00   Pack years: 4.00   Types: Cigarettes   Quit date: 12/14/1963   Years since quitting: 57.7  Smokeless Tobacco Never   BP Readings from Last 3 Encounters:  09/03/21 132/74  08/18/21 (!) 142/80  06/04/21 122/80   Pulse Readings from Last 3 Encounters:  09/03/21 66  08/18/21 72  06/04/21 64   Wt Readings from Last 3 Encounters:  09/03/21 196 lb (88.9 kg)  08/18/21 192 lb 9.6 oz (87.4 kg)  06/04/21 195 lb (88.5 kg)   BMI Readings from Last 3 Encounters:  09/03/21 28.53 kg/m  08/18/21 27.64 kg/m  06/04/21 27.98 kg/m    Assessment/Interventions: Review of patient past medical history, allergies, medications, health status, including review of consultants reports, laboratory and other test data, was performed as part of comprehensive evaluation and provision of chronic care management services.   SDOH:  (Social Determinants of Health) assessments and interventions performed: Yes   SDOH Screenings   Alcohol Screen: Low Risk    Last Alcohol Screening Score (AUDIT): 3  Depression (PHQ2-9): Low Risk    PHQ-2 Score: 0  Financial Resource Strain: Low Risk    Difficulty of Paying Living Expenses: Not hard at all  Food Insecurity: No Food Insecurity   Worried About Charity fundraiser in the Last Year: Never true   Ran Out of Food in the Last Year: Never true  Housing: Low Risk    Last Housing Risk Score: 0  Physical Activity: Sufficiently Active   Days of Exercise per Week: 5 days   Minutes of Exercise per Session: 30 min  Social Connections: Engineer, building services of Communication with Friends and Family: More than three times a week   Frequency of Social Gatherings with Friends and Family: More than three times a week   Attends Religious Services: More than 4 times per year    Active Member of [employer] or Organizations: Yes   Attends Music therapist: More than 4 times per year   Marital Status: Married  Stress: No Stress Concern Present   Feeling of Stress : Not at all  Tobacco Use: Medium Risk   Smoking Tobacco Use: Former   Smokeless Tobacco Use: Never  Transportation Needs: No Data processing manager (Medical): No   Lack of Transportation (Non-Medical): No    CCM Care Plan  Allergies  Allergen Reactions   Contrast Media [Iodinated Diagnostic Agents] Other (See Comments)    Redness and warm sensation, this reaction was noted on 02/27/13 during a Cardiac MRI per pt.  Pt sts he had  erythema on his head, chest, and shoulders.  Pt had a pacemaker placed August 2019 and was pre-medicated for the dye and had no allergic reaction at that time. -Carissa Mozingo B.S. RT(R)(CT)    Medications Reviewed Today     Reviewed by Charlton Haws, [facility] (Pharmacist) on 09/14/21 at [place] List Status: <None>   Medication Order Taking? Sig Documenting Provider Last Dose Status Informant  ACCU-CHEK AVIVA PLUS test strip [id number] Yes USE AS INSTRUCTED TO TEST BLOOD SUGAR 6 TIMES DAILY. DX CODE: E11.59 [name], MD Taking Active   ACCU-CHEK [facility] LANCETS lancets [id number] Yes Use to test blood sugar daily as directed. Hendricks Limes, MD Taking Active Self  albuterol [facility] HFA) 108 [phone] Base) MCG/ACT inhaler [id number] Yes Inhale 2 puffs into the lungs every 6 (six) hours as needed for wheezing or shortness of breath. [name], MD Taking Active   albuterol (PROVENTIL) (2.5 MG/3ML) 0.083% nebulizer solution [id number] Yes TAKE 3 MLS BY NEBULIZATION EVERY 6 (SIX) HOURS AS NEEDED FOR WHEEZING OR SHORTNESS OF BREATH. [name], MD Taking Active   aspirin 81 MG tablet [id number] Yes Take 81 mg by mouth at bedtime. [provider] Taking Active Self           Med Note Rose Phi   Tue Oct 31, 2018  9:32 AM)     celecoxib (CELEBREX) 100 MG capsule [id number] Yes TAKE 1 CAPSULE BY MOUTH EVERY DAY Lyndal Pulley, DO Taking Active   Continuous Blood Gluc Receiver (FREESTYLE LIBRE 14 DAY READER) DEVI [id number] Yes 1 Device by Does not apply route every 14 (fourteen) days. Caren Griffins, MD Taking Active   Continuous Blood Gluc Sensor (FREESTYLE LIBRE 14 DAY SENSOR) MISC [id number] Yes APPLY 1 SENSOR EVERY 14 DAYS Burns, Claudina Lick, MD Taking Active   famotidine (PEPCID) 20 MG tablet [id number]  Take 1 tablet (20 mg total) by mouth daily. Lavina Hamman, MD  Expired 09/03/21 2359   fexofenadine (ALLEGRA) 180 MG tablet [id number] Yes Take 180 mg by mouth daily as needed for allergies.  [provider] Taking Active Self  gabapentin (NEURONTIN) 100 MG capsule [id number] Yes Take 2 capsules (200 mg total) by mouth 3 (three) times daily. Lyndal Pulley, DO Taking Active   HYDROcodone-acetaminophen [facility]) 7.5-325 MG tablet [id number] Yes Take 1 tablet by mouth every 6 (six) hours as needed for severe pain (for chronic joint pain, neck pain). [name], MD Taking Active   LORazepam (ATIVAN) 1 MG tablet [id number] Yes TAKE 1/2 TO 1 TABLET BY MOUTH DAILY AS NEEDED Burns, Claudina Lick, MD Taking Active   metFORMIN (GLUCOPHAGE) 500 MG tablet [id number] Yes Take 1 tablet (500 mg total) by mouth 2 (two) times daily with a meal. Burns, Claudina Lick, MD Taking Active   montelukast (SINGULAIR) 10 MG tablet [id number] Yes TAKE 1 TABLET BY MOUTH AT  BEDTIME [name], MD Taking Active   Multiple Vitamin (MULTIVITAMIN WITH MINERALS) TABS tablet [id number] Yes Take 1 tablet by mouth daily. [provider] Taking Active Self  nebivolol (BYSTOLIC) 5 MG tablet [id number] Yes Take 1 tablet (5 mg total) by mouth daily. Please keep upcoming appointment for further refills Belva Crome, MD Taking Active   nitroGLYCERIN (NITROSTAT) 0.4 MG SL tablet [id number] Yes PLACE 1 TABLET (0.4 MG TOTAL) UNDER THE TONGUE EVERY 5 (FIVE)  MINUTES X 3 DOSES AS NEEDED FOR CHEST PAIN. Belva Crome, MD Taking Active  Respiratory Therapy Supplies (FLUTTER) DEVI [id number] Yes Twice a day and prn as needed, may increase if feeling worse Lauraine Rinne, NP Taking Active   rosuvastatin (CRESTOR) 10 MG tablet [id number] Yes TAKE ONE TABLET BY MOUTH DAILY ON MONDAY, [facility] AND FRIDAY Belva Crome, MD Taking Active   SYMBICORT 160-4.5 MCG/ACT inhaler [id number] Yes USE 1 TO 2 INHALATIONS BY  MOUTH EVERY 12 HOURS AS  NEEDED [name], MD Taking Active   vardenafil (LEVITRA) 20 MG tablet [id number] Yes TAKE 1 TABLET BY MOUTH AS DIRECTED. CAN NOT BE TAKEN WITH NITROGLYCERIN. NOT COVERED BY INSURANCE Burns, Claudina Lick, MD Taking Active   zinc gluconate 50 MG tablet [id number] Yes Take 50 mg by mouth daily. [provider] Taking Active             Patient Active Problem List   Diagnosis Date Noted   Sleep difficulties 08/17/2021   Aortic atherosclerosis ([place]) 02/15/2021   Degenerative disc disease, cervical 09/23/2020   Nonallopathic lesion of cervical region 09/23/2020   Nonallopathic lesion of thoracic region 09/23/2020   Pain medication agreement signed, 02/16/2021 02/15/2020   Peroneal mononeuropathy, left 12/25/2018   Diabetic polyneuropathy associated with type 2 diabetes mellitus ([place]) 12/25/2018   Cylindrical bronchiectasis ([place]) 12/20/2018   Viral pneumonia 12/12/2018   Community acquired pneumonia 11/18/2018   Encounter for chronic pain management 04/13/2017   Osteoarthritis 04/12/2017   Chronic combined systolic and diastolic CHF (congestive heart failure) ([place]) 01/24/2015   Left bundle branch block 11/27/2013   Essential hypertension 11/27/2013   GERD (gastroesophageal reflux disease) 02/27/2013   Type 2 diabetes mellitus with vascular disease ([place]) 04/09/2009   HLD (hyperlipidemia) 09/06/2008   Coronary atherosclerosis 09/06/2008   Asthma 09/06/2008    Immunization History  Administered Date(s)  Administered   Fluad Quad(high Dose 65+) 08/01/2019, 08/08/2021   Influenza Split 08/30/2012   Influenza, High Dose Seasonal PF 08/31/2017, 08/08/2020, 09/04/2021   Influenza,inj,Quad PF,6+ Mos 10/09/2013, 09/12/2015   Influenza,trivalent, recombinat, inj, PF 08/21/2018   Influenza-Unspecified 09/29/2014, 09/02/2017, 08/08/2020   Moderna Sars-Covid-2 Vaccination 09/12/2018, 01/25/2020, 03/03/2020   PFIZER Comirnaty([place])Covid-19 Tri-Sucrose Vaccine 05/26/2021   Pfizer Covid-19 Vaccine Bivalent Booster 45yr & up 09/07/2021   Pneumococcal Conjugate-13 03/04/2016   Pneumococcal Polysaccharide-23 04/12/2017   Td 05/13/2008   Tdap 07/02/2020    Conditions to be addressed/monitored:  Hypertension, Hyperlipidemia, Diabetes, Heart Failure, Coronary Artery Disease, Asthma, and Chronic pain  Care Plan : [place] Updates made by FCharlton Haws RLelysince 09/14/2021 12:00 AM     Problem: Hypertension, Hyperlipidemia, Diabetes, Heart Failure, Coronary Artery Disease, Asthma, and Chronic pain   Priority: High     Long-Range Goal: Disease management   Start Date: 03/09/2021  Expected End Date: 09/14/2022  This Visit's Progress: On track  Recent Progress: On track  Priority: High  Note:   Current Barriers:  Unable to independently monitor therapeutic efficacy  Pharmacist Clinical Goal(s):  Patient will achieve adherence to monitoring guidelines and medication adherence to achieve therapeutic efficacy maintain control of BP as evidenced by improved home readings  through collaboration with PharmD and provider.   Interventions: 1:1 collaboration with BBinnie Rail MD regarding development and update of comprehensive plan of care as evidenced by provider attestation and co-signature Inter-disciplinary care team collaboration (see longitudinal plan of care) Comprehensive medication review performed; medication list updated in electronic medical  record  Hyperlipidemia: (LDL goal < 70) -Controlled - LDL is at goal, pt denies side  effects. He is not taking NTG often. -hx coronary atherosclerosis -Current treatment: Rosuvastatin 10 mg MWF Aspirin 81 mg daily Nitroglycerin 0.4 mg SL prn -took 2-3 in past year -Educated on Cholesterol goals; -Counseled on benefits of aspirin given history of CAD -Recommended to continue current medication and take aspirin daily  Diabetes (A1c goal <7%) -Controlled - A1c is at goalpt reports improved glucose readings since increasing metformin to BID -Current home glucose readings: 90-160 -Current medications: Metformin 500 mg BID Freestyle Libre (pays OOP) Testing supplies -Denies hypoglycemic/hyperglycemic symptoms -Educated on A1c and blood sugar goals; Continuous glucose monitoring; Effect of stress and pain on blood sugar -Recommended to continue current medication  Heart Failure / HTN (Goal: BP goal <130/80, prevent exacerbations) -Controlled- BP is at goal during recent office visits; pt has upcoming appt with cardiologist and plan is to repeat ECHO -hx heart block w/ pacemaker in place -Last ejection fraction: 40-45% (Date: 06/23/2018) -HF type: Combined Systolic and Diastolic -Current treatment: Bystolic 5 mg -Medications previously tried: lisinopril/HCTZ  -Educated on Benefits of medications for managing symptoms and prolonging life; Importance of blood pressure control -Recommended to continue current medication  Asthma (Goal: control symptoms and prevent exacerbations) -Controlled - pt reports issues started late 2019 after respiratory infection, now he is doing much better on Symbicort; rarely uses nebulizers or rescue inhaler -Current treatment  Symbicort 160-4.5 mcg/act 1-2 puffs BID Albuterol HFA prn Albuterol 0.083% nebulization - not using Montelukast 10 mg HS -Pulmonary function testing: not on file -Exacerbations requiring treatment in last 6 months: none -Patient  reports consistent use of maintenance inhaler -Frequency of rescue inhaler use: infrequent -Recommended to continue current medication  Anxiety / Insomnia (Goal: manage symptoms ) -Controlled - pt takes lorazepam mainly for sleep and reports it is working very well; he denies extra doses -Current treatment: Lorazepam 1 mg HS -Educated on Benefits of medication for symptom control; potential side effects and risks associated with long term benzodiazepine use -Recommended to continue current medication as needed  Pain (Goal: manage symptoms ) -Controlled- pt has been taking gabapentin with hydrocodone for past few months and reports combination is more effective than either alone -keeps narcotics in a safe (daughter has hx of narcotic abuse and grandchildren live in the house) -hx osteoarthritis, polyneuropathy, cervical DDD (seen sports med and neurosurgery) -Current treatment  Gabapentin 100 mg - 2 cap HS Celecoxib 100 mg daily Hydrocodone-APAP 7.5-325 mg q6h PRN (#90/month) - usually TID (AM, afternoon, bedtime)  -Counseled on benefits of gabapentin and opioid-sparing properties -Recommend to continue current medication  Health Maintenance -Vaccine gaps: none -Colonoscopy due - pt reports he has spoken with GI and they want him to schedule consult appt to discuss screening, he plans to schedule appt soon  Patient Goals/Self-Care Activities Patient will:  - take medications as prescribed -focus on medication adherence by pill box -check glucose via CGM -check blood pressure periodically -Schedule appt with GI to discuss colon cancer screening      Compliance/Adherence/Medication fill history: Care Gaps: Eye exam Foot exam (04/18/20) Colonoscopy (05/06/21) - pt is planning to schedule GI appt  Star-Rating Drugs: Rosuvastatin - LF 8/30/2 x 90 ds Metformin - LF 08/24/21 x 90 ds  Medication Assistance: None required.  Patient affirms current coverage meets needs.  Patient's  preferred pharmacy is:  [facility]  ([place]) - [place], [place] [facility] [address] [place] [address] [place] [address] Phone: [phone] Fax: [phone]  [facility] [phone] [place]  [facility] - [place], [place] [address] [place] [address] Phone: [phone] Fax: [phone]  [facility] - [place], [address] [place] [address] Phone: [phone] Fax: [phone]  Uses pill box? Yes Pt endorses 100% compliance  We discussed: Current pharmacy is preferred with insurance plan and patient is satisfied with pharmacy services Patient decided to: Continue current medication management strategy  Care Plan and Follow Up Patient Decision:  Patient agrees to Care Plan and Follow-up.  Plan: Telephone follow up appointment with care management team member scheduled for:  6 months  Charlene Brooke, PharmD, [place], CPP Clinical Pharmacist [facility] at [facility] [phone]

## 2021-09-14 NOTE — Patient Instructions (Signed)
Visit Information  Phone number for Pharmacist: [phone]   Goals Addressed             This Visit's Progress    Manage My Medicine       Timeframe:  Long-Range Goal Priority:  Medium Start Date:        03/09/21                     Expected End Date:    09/14/22               Follow Up Date 09/14/22   - call for medicine refill 2 or 3 days before it runs out - call if I am sick and can't take my medicine - keep a list of all the medicines I take; vitamins and herbals too - use a pillbox to sort medicine    Why is this important?   These steps will help you keep on track with your medicines.   Notes:         Care Plan : [place]  Updates made by Charlton Haws, RPH since 09/14/2021 12:00 AM     Problem: Hypertension, Hyperlipidemia, Diabetes, Heart Failure, Coronary Artery Disease, Asthma, and Chronic pain   Priority: High     Long-Range Goal: Disease management   Start Date: 03/09/2021  Expected End Date: 09/14/2022  This Visit's Progress: On track  Recent Progress: On track  Priority: High  Note:   Current Barriers:  Unable to independently monitor therapeutic efficacy  Pharmacist Clinical Goal(s):  Patient will achieve adherence to monitoring guidelines and medication adherence to achieve therapeutic efficacy maintain control of BP as evidenced by improved home readings  through collaboration with PharmD and provider.   Interventions: 1:1 collaboration with [name], MD regarding development and update of comprehensive plan of care as evidenced by provider attestation and co-signature Inter-disciplinary care team collaboration (see longitudinal plan of care) Comprehensive medication review performed; medication list updated in electronic medical record  Hyperlipidemia: (LDL goal < 70) -Controlled - LDL is at goal, pt denies side effects. He is not taking NTG often. -hx coronary atherosclerosis -Current treatment: Rosuvastatin 10  mg MWF Aspirin 81 mg daily Nitroglycerin 0.4 mg SL prn -took 2-3 in past year -Educated on Cholesterol goals; -Counseled on benefits of aspirin given history of CAD -Recommended to continue current medication and take aspirin daily  Diabetes (A1c goal <7%) -Controlled - A1c is at goalpt reports improved glucose readings since increasing metformin to BID -Current home glucose readings: 90-160 -Current medications: Metformin 500 mg BID Freestyle Libre (pays OOP) Testing supplies -Denies hypoglycemic/hyperglycemic symptoms -Educated on A1c and blood sugar goals; Continuous glucose monitoring; Effect of stress and pain on blood sugar -Recommended to continue current medication  Heart Failure / HTN (Goal: BP goal <130/80, prevent exacerbations) -Controlled- BP is at goal during recent office visits; pt has upcoming appt with cardiologist and plan is to repeat ECHO -hx heart block w/ pacemaker in place -Last ejection fraction: 40-45% (Date: 06/23/2018) -HF type: Combined Systolic and Diastolic -Current treatment: Bystolic 5 mg -Medications previously tried: lisinopril/HCTZ  -Educated on Benefits of medications for managing symptoms and prolonging life; Importance of blood pressure control -Recommended to continue current medication  Asthma (Goal: control symptoms and prevent exacerbations) -Controlled - pt reports issues started late 2019 after respiratory infection, now he is doing much better on Symbicort; rarely uses nebulizers or rescue inhaler -Current treatment  Symbicort 160-4.5 mcg/act 1-2 puffs  BID Albuterol HFA prn Albuterol 0.083% nebulization - not using Montelukast 10 mg HS -Pulmonary function testing: not on file -Exacerbations requiring treatment in last 6 months: none -Patient reports consistent use of maintenance inhaler -Frequency of rescue inhaler use: infrequent -Recommended to continue current medication  Anxiety / Insomnia (Goal: manage symptoms ) -Controlled  - pt takes lorazepam mainly for sleep and reports it is working very well; he denies extra doses -Current treatment: Lorazepam 1 mg HS -Educated on Benefits of medication for symptom control; potential side effects and risks associated with long term benzodiazepine use -Recommended to continue current medication as needed  Pain (Goal: manage symptoms ) -Controlled- pt has been taking gabapentin with hydrocodone for past few months and reports combination is more effective than either alone -keeps narcotics in a safe (daughter has hx of narcotic abuse and grandchildren live in the house) -hx osteoarthritis, polyneuropathy, cervical DDD (seen sports med and neurosurgery) -Current treatment  Gabapentin 100 mg - 2 cap HS Celecoxib 100 mg daily Hydrocodone-APAP 7.5-325 mg q6h PRN (#90/month) - usually TID (AM, afternoon, bedtime)  -Counseled on benefits of gabapentin and opioid-sparing properties -Recommend to continue current medication  Health Maintenance -Vaccine gaps: none -Colonoscopy due - pt reports he has spoken with GI and they want him to schedule consult appt to discuss screening, he plans to schedule appt soon  Patient Goals/Self-Care Activities Patient will:  - take medications as prescribed -focus on medication adherence by pill box -check glucose via CGM -check blood pressure periodically -Schedule appt with GI to discuss colon cancer screening      Patient verbalizes understanding of instructions provided today and agrees to view in [place].  Telephone follow up appointment with pharmacy team member scheduled for: 6 months  Charlene Brooke, PharmD, [place], CPP Clinical Pharmacist [place] [facility] at [facility] [phone]

## 2021-09-25 ENCOUNTER — Other Ambulatory Visit: Payer: Self-pay | Admitting: Internal Medicine

## 2021-09-25 MED ORDER — HYDROCODONE-ACETAMINOPHEN 7.5-325 MG PO TABS: 1.0000 | ORAL_TABLET | Freq: Four times a day (QID) | ORAL | 0 refills | Status: DC | PRN

## 2021-09-29 ENCOUNTER — Other Ambulatory Visit: Payer: Self-pay | Admitting: Family Medicine

## 2021-10-09 ENCOUNTER — Telehealth: Payer: Self-pay | Admitting: Internal Medicine

## 2021-10-09 MED ORDER — FREESTYLE LIBRE READER DEVI: 1.0000 | 0 refills | Status: DC

## 2021-10-09 MED ORDER — FREESTYLE LIBRE 3 SENSOR MISC: 1.0000 | 3 refills | Status: DC

## 2021-10-09 NOTE — Telephone Encounter (Signed)
Patient says he was told by pharmacy that the manufacturer would no longer be making the:  Continuous Blood Gluc Receiver (FREESTYLE LIBRE 14 DAY READER) [place] Continuous Blood Gluc Sensor (FREESTYLE LIBRE [place]) [place]  Patient says he needs new rx for FREESTYLE 3 Device & Sensor  Pharmacy:  [facility], [place]  Phone:  [phone] Fax:  [phone]

## 2021-10-12 DIAGNOSIS — I10 Essential (primary) hypertension: Secondary | ICD-10-CM

## 2021-10-12 DIAGNOSIS — E1142 Type 2 diabetes mellitus with diabetic polyneuropathy: Secondary | ICD-10-CM | POA: Diagnosis not present

## 2021-10-12 DIAGNOSIS — E782 Mixed hyperlipidemia: Secondary | ICD-10-CM | POA: Diagnosis not present

## 2021-10-12 DIAGNOSIS — I25118 Atherosclerotic heart disease of native coronary artery with other forms of angina pectoris: Secondary | ICD-10-CM | POA: Diagnosis not present

## 2021-10-12 DIAGNOSIS — E1159 Type 2 diabetes mellitus with other circulatory complications: Secondary | ICD-10-CM

## 2021-10-12 DIAGNOSIS — J45909 Unspecified asthma, uncomplicated: Secondary | ICD-10-CM | POA: Diagnosis not present

## 2021-10-12 DIAGNOSIS — I5042 Chronic combined systolic (congestive) and diastolic (congestive) heart failure: Secondary | ICD-10-CM | POA: Diagnosis not present

## 2021-10-21 NOTE — Progress Notes (Incomplete)
Cardiology Office Note:    Date:  10/21/2021   ID:  Jeffery [name], DOB [birth year], MRN [id number]  PCP:  [name], MD  Cardiologist:  Sinclair Grooms, MD   Referring MD: [name], MD   No chief complaint on file.   History of Present Illness:    Jeffery [name] is a 80 y.o. male with a hx of primary hypertension, hyperlipidemia, LBBB and  high grade AV block--> BiV pacer 2019, obstructive sleep apnea, chronic combined systolic and diastolic HF, diabetes mellitus, obesity and recent hospital stay for dyspnea and hypoxia(2020).  ***  Past Medical History:  Diagnosis Date   Arthritis    "thumbs" (07/25/2018)   Asthma    CHF (congestive heart failure) (HCC)    Chronic bronchitis (HCC)    Environmental allergies    GERD (gastroesophageal reflux disease)    Headache    "seasonal; w/environmental allergies" (07/25/2018)   History of blood transfusion    "when I had laminectomy" (07/25/2018)   HTN (hypertension)    Hyperlipidemia    LBBB (left bundle branch block) 1999   Metatarsal bone fracture 03/07/2018   Myocardial infarction [facility])    "was told I've had an old MI; probably in the 50s" (07/25/2018)   Pneumonia    "as a child, age 6; viral pneumonia 3 times in the last 10 years" (07/25/2018)   Presence of permanent cardiac pacemaker 07/25/2018   Seasonal allergies    Sleep apnea    "wife says I do" (07/25/2018)   Type II diabetes mellitus ([place])     Past Surgical History:  Procedure Laterality Date   BACK SURGERY     BIV PACEMAKER INSERTION CRT-P  07/25/2018   BIV PACEMAKER INSERTION CRT-P N/A 07/25/2018   Procedure: BIV PACEMAKER INSERTION CRT-P;  Surgeon: Evans Lance, MD;  Location: [place] [facility];  Service: Cardiovascular;  Laterality: N/A;   CARDIAC CATHETERIZATION  2003   Dr Pernell Dupre; 85 % R circumflex obstruction   CARPAL TUNNEL RELEASE Left 10/11/2014   Procedure: LEFT CARPAL TUNNEL RELEASE;  Surgeon: Roseanne Kaufman, MD;  Location:  [place];  Service: Orthopedics;  Laterality: Left;   COLONOSCOPY W/ BIOPSIES AND POLYPECTOMY  2018   INGUINAL HERNIA REPAIR Right 1948   INGUINAL HERNIA REPAIR Left 1988   LUMBAR LAMINECTOMY  1984   MINOR CARPAL TUNNEL Right 11/22/2014   Procedure: RIGHT LIMITED OPEN CARPAL TUNNEL RELEASE;  Surgeon: Roseanne Kaufman, MD;  Location: [facility];  Service: Orthopedics;  Laterality: Right;   TONSILLECTOMY  1958    Current Medications: No outpatient medications have been marked as taking for the 10/22/21 encounter (Appointment) with Belva Crome, MD.     Allergies:   Contrast media [iodinated diagnostic agents]   Social History   Socioeconomic History   Marital status: Married    Spouse name: Not on file   Number of children: 3   Years of education: Not on file   Highest education level: Professional school degree (e.g., MD, DDS, DVM, JD)  Occupational History   Occupation: retired Pension scheme manager: [employer]  Tobacco Use   Smoking status: Former    Packs/day: 2.00    Years: 2.00    Pack years: 4.00    Types: Cigarettes    Quit date: 12/14/1963    Years since quitting: 57.8   Smokeless tobacco: Never  Vaping Use   Vaping Use: Never used  Substance  and Sexual Activity   Alcohol use: Not Currently    Alcohol/week: 2.0 standard drinks    Types: 2 Glasses of wine per week    Comment: occasional   Drug use: Never   Sexual activity: Yes  Other Topics Concern   Not on file  Social History Narrative   Lives with wife in a 2 story home.  Has 3 daughters.  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     Social Determinants of Health   Financial Resource Strain: Low Risk    Difficulty of Paying Living Expenses: Not hard at all  Food Insecurity: No Food Insecurity   Worried About Charity fundraiser in the Last Year: Never true   [place] in the Last Year: Never true  Transportation Needs: No Transportation Needs   Lack of  Transportation (Medical): No   Lack of Transportation (Non-Medical): No  Physical Activity: Sufficiently Active   Days of Exercise per Week: 5 days   Minutes of Exercise per Session: 30 min  Stress: No Stress Concern Present   Feeling of Stress : Not at all  Social Connections: Socially Integrated   Frequency of Communication with Friends and Family: More than three times a week   Frequency of Social Gatherings with Friends and Family: More than three times a week   Attends Religious Services: More than 4 times per year   Active Member of [employer] or Organizations: Yes   Attends Music therapist: More than 4 times per year   Marital Status: Married     Family History: The patient's family history includes Colon polyps in his father; Diabetes in his maternal aunt; Heart attack in his father; Heart failure in his mother; Hypertension in his mother; Subarachnoid hemorrhage (age of onset: 61) in his mother; Subarachnoid hemorrhage (age of onset: 49) in his paternal grandfather. There is no history of Colon cancer.  ROS:   Please see the history of present illness.    *** All other systems reviewed and are negative.  EKGs/Labs/Other Studies Reviewed:    The following studies were reviewed today: 2D Doppler echocardiogram July, 2019: Study Conclusions   - Left ventricle: The cavity size was mildly dilated. Systolic    function was mildly to moderately reduced. The estimated ejection    fraction was in the range of 40% to 45%. Although no diagnostic    regional wall motion abnormality was identified, this possibility    cannot be completely excluded on the basis of this study.    Features are consistent with a pseudonormal left ventricular    filling pattern, with concomitant abnormal relaxation and    increased filling pressure (grade 2 diastolic dysfunction).  - Ventricular septum: Septal motion showed abnormal function and    dyssynergy. These changes are consistent with a  left bundle    branch block.  - Aortic valve: Trileaflet; normal thickness leaflets.    Transvalvular velocity was within the normal range. There was no    stenosis. There was no regurgitation.  - Mitral valve: Transvalvular velocity was within the normal range.    There was no evidence for stenosis. There was no regurgitation.  - Right ventricle: The cavity size was moderately dilated. Wall    thickness was normal. Systolic function was mildly reduced.  - Atrial septum: No defect or patent foramen ovale was identified.  - Tricuspid valve: There was trivial regurgitation.  - Pulmonic valve: There was no significant regurgitation.   Impressions:   -  Moderately reduced systolic LV function. Septal motion consistent    with dyssynchrony. No significant change from prior.  EKG:  EKG ***  Recent Labs: 02/16/2021: Hemoglobin 14.2; Platelets 248.0; TSH 1.64 08/18/2021: ALT 22; BUN 18; Creatinine, Ser 1.09; Potassium 4.7; Sodium 138  Recent Lipid Panel    Component Value Date/Time   CHOL 113 02/16/2021 1010   CHOL 122 09/14/2019 0936   CHOL 143 08/20/2014 0750   TRIG 109.0 02/16/2021 1010   TRIG 145 08/20/2014 0750   HDL 34.50 (L) 02/16/2021 1010   HDL 37 (L) 09/14/2019 0936   HDL 33 (L) 08/20/2014 0750   CHOLHDL 3 02/16/2021 1010   VLDL 21.8 02/16/2021 1010   LDLCALC 57 02/16/2021 1010   LDLCALC 62 08/20/2020 1144   LDLCALC 81 08/20/2014 0750    Physical Exam:    VS:  There were no vitals taken for this visit.    Wt Readings from Last 3 Encounters:  09/03/21 196 lb (88.9 kg)  08/18/21 192 lb 9.6 oz (87.4 kg)  06/04/21 195 lb (88.5 kg)     GEN: ***. No acute distress HEENT: Normal NECK: No JVD. LYMPHATICS: No lymphadenopathy CARDIAC: *** murmur. RRR *** gallop, or edema. VASCULAR: *** Normal Pulses. No bruits. RESPIRATORY:  Clear to auscultation without rales, wheezing or rhonchi  ABDOMEN: Soft, non-tender, non-distended, No pulsatile mass, MUSCULOSKELETAL: No deformity   SKIN: Warm and dry NEUROLOGIC:  Alert and oriented x 3 PSYCHIATRIC:  Normal affect   ASSESSMENT:    1. NICM (nonischemic cardiomyopathy) ([place])   2. Mobitz type 2 second degree heart block   3. Coronary artery disease involving native coronary artery of native heart without angina pectoris   4. Left bundle branch block   5. Chronic systolic heart failure ([place])   6. Biventricular cardiac pacemaker in situ   7. Hyperlipidemia, unspecified hyperlipidemia type   8. OSA (obstructive sleep apnea)   9. Essential hypertension    PLAN:    In order of problems listed above:  ***   Medication Adjustments/Labs and Tests Ordered: Current medicines are reviewed at length with the patient today.  Concerns regarding medicines are outlined above.  No orders of the defined types were placed in this encounter.  No orders of the defined types were placed in this encounter.   There are no Patient Instructions on file for this visit.   Signed, Sinclair Grooms, MD  10/21/2021 10:24 AM    [place]

## 2021-10-22 ENCOUNTER — Ambulatory Visit: Payer: Medicare Other | Admitting: Interventional Cardiology

## 2021-10-22 DIAGNOSIS — I2510 Atherosclerotic heart disease of native coronary artery without angina pectoris: Secondary | ICD-10-CM

## 2021-10-22 DIAGNOSIS — I428 Other cardiomyopathies: Secondary | ICD-10-CM

## 2021-10-22 DIAGNOSIS — E785 Hyperlipidemia, unspecified: Secondary | ICD-10-CM

## 2021-10-22 DIAGNOSIS — G4733 Obstructive sleep apnea (adult) (pediatric): Secondary | ICD-10-CM

## 2021-10-22 DIAGNOSIS — I10 Essential (primary) hypertension: Secondary | ICD-10-CM

## 2021-10-22 DIAGNOSIS — I5022 Chronic systolic (congestive) heart failure: Secondary | ICD-10-CM

## 2021-10-22 DIAGNOSIS — I441 Atrioventricular block, second degree: Secondary | ICD-10-CM

## 2021-10-22 DIAGNOSIS — Z950 Presence of cardiac pacemaker: Secondary | ICD-10-CM

## 2021-10-22 DIAGNOSIS — I447 Left bundle-branch block, unspecified: Secondary | ICD-10-CM

## 2021-10-28 ENCOUNTER — Other Ambulatory Visit: Payer: Self-pay | Admitting: Internal Medicine

## 2021-10-28 MED ORDER — HYDROCODONE-ACETAMINOPHEN 7.5-325 MG PO TABS: 1.0000 | ORAL_TABLET | Freq: Four times a day (QID) | ORAL | 0 refills | Status: DC | PRN

## 2021-10-30 ENCOUNTER — Ambulatory Visit (INDEPENDENT_AMBULATORY_CARE_PROVIDER_SITE_OTHER): Payer: Medicare Other

## 2021-10-30 DIAGNOSIS — I441 Atrioventricular block, second degree: Secondary | ICD-10-CM | POA: Diagnosis not present

## 2021-10-30 LAB — CUP PACEART REMOTE DEVICE CHECK
Battery Remaining Longevity: 53 mo
Battery Remaining Percentage: 60 %
Battery Voltage: 2.98 V
Brady Statistic AP VP Percent: 39 %
Brady Statistic AP VS Percent: 1 %
Brady Statistic AS VP Percent: 60 %
Brady Statistic AS VS Percent: 1 %
Brady Statistic RA Percent Paced: 39 %
Date Time Interrogation Session: 20221118020008
Implantable Lead Implant Date: 20190813
Implantable Lead Implant Date: 20190813
Implantable Lead Implant Date: 20190813
Implantable Lead Location: 753858
Implantable Lead Location: 753859
Implantable Lead Location: 753860
Implantable Pulse Generator Implant Date: 20190813
Lead Channel Impedance Value: 450 Ohm
Lead Channel Impedance Value: 460 Ohm
Lead Channel Impedance Value: 730 Ohm
Lead Channel Pacing Threshold Amplitude: 0.5 V
Lead Channel Pacing Threshold Amplitude: 0.625 V
Lead Channel Pacing Threshold Amplitude: 1.25 V
Lead Channel Pacing Threshold Pulse Width: 0.5 ms
Lead Channel Pacing Threshold Pulse Width: 0.5 ms
Lead Channel Pacing Threshold Pulse Width: 0.5 ms
Lead Channel Sensing Intrinsic Amplitude: 12 mV
Lead Channel Sensing Intrinsic Amplitude: 2.8 mV
Lead Channel Setting Pacing Amplitude: 1.625
Lead Channel Setting Pacing Amplitude: 2.25 V
Lead Channel Setting Pacing Amplitude: 2.5 V
Lead Channel Setting Pacing Pulse Width: 0.5 ms
Lead Channel Setting Pacing Pulse Width: 0.5 ms
Lead Channel Setting Sensing Sensitivity: 5 mV
Pulse Gen Model: 3562
Pulse Gen Serial Number: 9043864

## 2021-11-09 NOTE — Progress Notes (Signed)
Remote pacemaker transmission.   

## 2021-11-15 ENCOUNTER — Other Ambulatory Visit: Payer: Self-pay | Admitting: Interventional Cardiology

## 2021-11-16 ENCOUNTER — Other Ambulatory Visit: Payer: Self-pay

## 2021-11-16 MED ORDER — ROSUVASTATIN CALCIUM 10 MG PO TABS: ORAL_TABLET | ORAL | 0 refills | Status: DC

## 2021-11-19 ENCOUNTER — Other Ambulatory Visit: Payer: Self-pay | Admitting: Family Medicine

## 2021-11-20 ENCOUNTER — Other Ambulatory Visit: Payer: Self-pay | Admitting: Internal Medicine

## 2021-11-29 ENCOUNTER — Other Ambulatory Visit: Payer: Self-pay | Admitting: Internal Medicine

## 2021-11-30 MED ORDER — HYDROCODONE-ACETAMINOPHEN 7.5-325 MG PO TABS: 1.0000 | ORAL_TABLET | Freq: Four times a day (QID) | ORAL | 0 refills | Status: DC | PRN

## 2021-12-14 DIAGNOSIS — H25013 Cortical age-related cataract, bilateral: Secondary | ICD-10-CM | POA: Diagnosis not present

## 2021-12-14 DIAGNOSIS — E119 Type 2 diabetes mellitus without complications: Secondary | ICD-10-CM | POA: Diagnosis not present

## 2021-12-14 DIAGNOSIS — H5203 Hypermetropia, bilateral: Secondary | ICD-10-CM | POA: Diagnosis not present

## 2021-12-14 DIAGNOSIS — H2513 Age-related nuclear cataract, bilateral: Secondary | ICD-10-CM | POA: Diagnosis not present

## 2021-12-15 ENCOUNTER — Telehealth: Payer: Self-pay | Admitting: Internal Medicine

## 2021-12-15 NOTE — Telephone Encounter (Signed)
Patient was calling in about his pacemaker. Please advise

## 2021-12-15 NOTE — Telephone Encounter (Signed)
Unsuccessful telephone encounter to patient to follow up on his incoming call to discuss his pacemaker. [employer] compliant VM message left requesting call back to [phone].

## 2021-12-16 NOTE — Telephone Encounter (Signed)
Attempted to reach pt.  No answer, LVM with device clinic # and hours.

## 2021-12-17 ENCOUNTER — Encounter: Payer: Self-pay | Admitting: Interventional Cardiology

## 2021-12-17 NOTE — Telephone Encounter (Signed)
Patient called in and stated the other day he was cleaning and got SOB and checked his heart rate by palpation and he got 40bpm asked patient to send in a manual transmission, normal device function patient As/Bi-Vp on presenting rhythm automatic lead thresholds @0 .39ms.  Only thing of note patient's [place] shows some fluid during Dec/Jan. Fluid has stabilized as of today.

## 2021-12-17 NOTE — Telephone Encounter (Signed)
3rd attempt to reach patient via phone left voicemail direct number to device clinic given also sent patient my-chart message. Closing encounter

## 2021-12-28 ENCOUNTER — Other Ambulatory Visit: Payer: Self-pay | Admitting: Internal Medicine

## 2021-12-28 ENCOUNTER — Other Ambulatory Visit: Payer: Self-pay | Admitting: Interventional Cardiology

## 2021-12-30 ENCOUNTER — Other Ambulatory Visit: Payer: Self-pay | Admitting: Internal Medicine

## 2022-01-01 ENCOUNTER — Other Ambulatory Visit: Payer: Self-pay | Admitting: Interventional Cardiology

## 2022-01-01 MED ORDER — HYDROCODONE-ACETAMINOPHEN 7.5-325 MG PO TABS: 1.0000 | ORAL_TABLET | Freq: Four times a day (QID) | ORAL | 0 refills | Status: DC | PRN

## 2022-01-12 ENCOUNTER — Encounter: Payer: Self-pay | Admitting: Gastroenterology

## 2022-01-22 ENCOUNTER — Other Ambulatory Visit: Payer: Self-pay | Admitting: Interventional Cardiology

## 2022-01-27 DIAGNOSIS — R030 Elevated blood-pressure reading, without diagnosis of hypertension: Secondary | ICD-10-CM | POA: Diagnosis not present

## 2022-01-27 DIAGNOSIS — M5481 Occipital neuralgia: Secondary | ICD-10-CM | POA: Diagnosis not present

## 2022-01-28 ENCOUNTER — Other Ambulatory Visit: Payer: Self-pay | Admitting: Internal Medicine

## 2022-01-29 ENCOUNTER — Ambulatory Visit (INDEPENDENT_AMBULATORY_CARE_PROVIDER_SITE_OTHER): Payer: Medicare Other

## 2022-01-29 DIAGNOSIS — I441 Atrioventricular block, second degree: Secondary | ICD-10-CM | POA: Diagnosis not present

## 2022-01-29 LAB — CUP PACEART REMOTE DEVICE CHECK
Battery Remaining Longevity: 50 mo
Battery Remaining Percentage: 57 %
Battery Voltage: 2.98 V
Brady Statistic AP VP Percent: 40 %
Brady Statistic AP VS Percent: 1 %
Brady Statistic AS VP Percent: 59 %
Brady Statistic AS VS Percent: 1 %
Brady Statistic RA Percent Paced: 39 %
Date Time Interrogation Session: 20230217020010
Implantable Lead Implant Date: 20190813
Implantable Lead Implant Date: 20190813
Implantable Lead Implant Date: 20190813
Implantable Lead Location: 753858
Implantable Lead Location: 753859
Implantable Lead Location: 753860
Implantable Pulse Generator Implant Date: 20190813
Lead Channel Impedance Value: 440 Ohm
Lead Channel Impedance Value: 460 Ohm
Lead Channel Impedance Value: 740 Ohm
Lead Channel Pacing Threshold Amplitude: 0.5 V
Lead Channel Pacing Threshold Amplitude: 0.625 V
Lead Channel Pacing Threshold Amplitude: 1.375 V
Lead Channel Pacing Threshold Pulse Width: 0.5 ms
Lead Channel Pacing Threshold Pulse Width: 0.5 ms
Lead Channel Pacing Threshold Pulse Width: 0.5 ms
Lead Channel Sensing Intrinsic Amplitude: 12 mV
Lead Channel Sensing Intrinsic Amplitude: 3.1 mV
Lead Channel Setting Pacing Amplitude: 1.625
Lead Channel Setting Pacing Amplitude: 2.375
Lead Channel Setting Pacing Amplitude: 2.5 V
Lead Channel Setting Pacing Pulse Width: 0.5 ms
Lead Channel Setting Pacing Pulse Width: 0.5 ms
Lead Channel Setting Sensing Sensitivity: 5 mV
Pulse Gen Model: 3562
Pulse Gen Serial Number: 9043864

## 2022-01-29 MED ORDER — HYDROCODONE-ACETAMINOPHEN 7.5-325 MG PO TABS: 1.0000 | ORAL_TABLET | Freq: Four times a day (QID) | ORAL | 0 refills | Status: DC | PRN

## 2022-02-03 ENCOUNTER — Ambulatory Visit: Payer: Medicare Other | Admitting: Gastroenterology

## 2022-02-03 ENCOUNTER — Encounter: Payer: Self-pay | Admitting: Gastroenterology

## 2022-02-03 VITALS — BP 130/76 | HR 72 | Ht 69.5 in | Wt 195.2 lb

## 2022-02-03 DIAGNOSIS — Z860100 Personal history of colon polyps, unspecified: Secondary | ICD-10-CM | POA: Insufficient documentation

## 2022-02-03 NOTE — Progress Notes (Deleted)
02/03/2022 [place] [id number] 1941/06/01   HISTORY OF PRESENT ILLNESS: ***         Past Medical History:  Diagnosis Date   Arthritis    "thumbs" (07/25/2018)   Asthma    CHF (congestive heart failure) (HCC)    Chronic bronchitis (HCC)    Environmental allergies    GERD (gastroesophageal reflux disease)    Headache    "seasonal; w/environmental allergies" (07/25/2018)   History of blood transfusion    "when I had laminectomy" (07/25/2018)   HTN (hypertension)    Hyperlipidemia    LBBB (left bundle branch block) 1999   Metatarsal bone fracture 03/07/2018   Myocardial infarction [facility])    "was told I've had an old MI; probably in the 1990s" (07/25/2018)   Pneumonia    "as a child, age 42; viral pneumonia 3 times in the last 10 years" (07/25/2018)   Presence of permanent cardiac pacemaker 07/25/2018   Seasonal allergies    Sleep apnea    "wife says I do" (07/25/2018)   Type II diabetes mellitus ([place])    Past Surgical History:  Procedure Laterality Date   BACK SURGERY     BIV PACEMAKER INSERTION CRT-P  07/25/2018   BIV PACEMAKER INSERTION CRT-P N/A 07/25/2018   Procedure: BIV PACEMAKER INSERTION CRT-P;  Surgeon: Evans Lance, MD;  Location: [facility];  Service: Cardiovascular;  Laterality: N/A;   CARDIAC CATHETERIZATION  2003   Dr Pernell Dupre; 85 % R circumflex obstruction   CARPAL TUNNEL RELEASE Left 10/11/2014   Procedure: LEFT CARPAL TUNNEL RELEASE;  Surgeon: Roseanne Kaufman, MD;  Location: [facility];  Service: Orthopedics;  Laterality: Left;   COLONOSCOPY W/ BIOPSIES AND POLYPECTOMY  2018   INGUINAL HERNIA REPAIR Right 1948   INGUINAL HERNIA REPAIR Left 1988   LUMBAR LAMINECTOMY  1984   MINOR CARPAL TUNNEL Right 11/22/2014   Procedure: RIGHT LIMITED OPEN CARPAL TUNNEL RELEASE;  Surgeon: Roseanne Kaufman, MD;  Location: [facility];  Service: Orthopedics;  Laterality: Right;   TONSILLECTOMY  1958    reports that he  quit smoking about 58 years ago. His smoking use included cigarettes. He has a 4.00 pack-year smoking history. He has never used smokeless tobacco. He reports that he does not currently use alcohol after a past usage of about 2.0 standard drinks per week. He reports that he does not use drugs. family history includes Colon polyps in his father; Diabetes in his maternal aunt; Heart attack in his father; Heart failure in his mother; Hypertension in his mother; Subarachnoid hemorrhage (age of onset: 17) in his mother; Subarachnoid hemorrhage (age of onset: 60) in his paternal grandfather. Allergies  Allergen Reactions   Contrast Media [Iodinated Contrast Media] Other (See Comments)    Redness and warm sensation, this reaction was noted on 02/27/13 during a Cardiac MRI per pt.  Pt sts he had erythema on his head, chest, and shoulders.  Pt had a pacemaker placed August 2019 and was pre-medicated for the dye and had no allergic reaction at that time. -Carissa Mozingo B.S. RT(R)(CT)      Outpatient Encounter Medications as of 02/03/2022  Medication Sig   ACCU-CHEK AVIVA PLUS test strip USE AS INSTRUCTED TO TEST BLOOD SUGAR 6 TIMES DAILY. DX CODE: E11.59   ACCU-CHEK SOFTCLIX LANCETS lancets Use to test blood sugar daily as directed.   albuterol (PROAIR HFA) 108 (90 Base) MCG/ACT inhaler Inhale 2 puffs into the lungs every  6 (six) hours as needed for wheezing or shortness of breath.   albuterol (PROVENTIL) (2.5 MG/3ML) 0.083% nebulizer solution TAKE 3 MLS BY NEBULIZATION EVERY 6 (SIX) HOURS AS NEEDED FOR WHEEZING OR SHORTNESS OF BREATH.   aspirin 81 MG tablet Take 81 mg by mouth at bedtime.   celecoxib (CELEBREX) 100 MG capsule TAKE 1 CAPSULE BY MOUTH EVERY DAY   Continuous Blood Gluc Receiver (FREESTYLE LIBRE READER) DEVI 1 Device by Does not apply route every 14 (fourteen) days. Freestyle libre 3 reader   Continuous Blood Gluc Sensor (FREESTYLE LIBRE 14 DAY SENSOR) MISC APPLY 1 SENSOR EVERY 14 DAYS    famotidine (PEPCID) 20 MG tablet Take 1 tablet (20 mg total) by mouth daily.   fexofenadine (ALLEGRA) 180 MG tablet Take 180 mg by mouth daily as needed for allergies.    gabapentin (NEURONTIN) 100 MG capsule TAKE 2 CAPSULES BY MOUTH 3 TIMES DAILY.   HYDROcodone-acetaminophen (NORCO) 7.5-325 MG tablet Take 1 tablet by mouth every 6 (six) hours as needed for severe pain (for chronic joint pain, neck pain).   LORazepam (ATIVAN) 1 MG tablet TAKE 1/2 TO 1 TABLET BY MOUTH DAILY AS NEEDED   metFORMIN (GLUCOPHAGE) 500 MG tablet TAKE 1 TABLET BY MOUTH 2 TIMES DAILY WITH A MEAL.   montelukast (SINGULAIR) 10 MG tablet TAKE 1 TABLET BY MOUTH AT  BEDTIME   Multiple Vitamin (MULTIVITAMIN WITH MINERALS) TABS tablet Take 1 tablet by mouth daily.   nebivolol (BYSTOLIC) 5 MG tablet Take 1 tablet (5 mg total) by mouth daily. Please keep upcoming appointment for further refills   nitroGLYCERIN (NITROSTAT) 0.4 MG SL tablet PLACE 1 TABLET (0.4 MG TOTAL) UNDER THE TONGUE EVERY 5 (FIVE) MINUTES X 3 DOSES AS NEEDED FOR CHEST PAIN.   Respiratory Therapy Supplies (FLUTTER) DEVI Twice a day and prn as needed, may increase if feeling worse   rosuvastatin (CRESTOR) 10 MG tablet TAKE 1 TABLET ON MONDAY, [place]. PLEASE MAKE OVERDUE APPT WITH DR. Tamala Julian BEFORE ANYMORE REFILLS. THANK YOU 2ND ATTEMPT   SYMBICORT 160-4.5 MCG/ACT inhaler USE 1 TO 2 INHALATIONS BY  MOUTH EVERY 12 HOURS AS  NEEDED   vardenafil (LEVITRA) 20 MG tablet TAKE 1 TABLET BY MOUTH AS DIRECTED. CAN NOT BE TAKEN WITH NITROGLYCERIN. NOT COVERED BY INSURANCE   zinc gluconate 50 MG tablet Take 50 mg by mouth daily.   No facility-administered encounter medications on file as of 02/03/2022.     REVIEW OF SYSTEMS  : All other systems reviewed and negative except where noted in the History of Present Illness.   PHYSICAL EXAM: There were no vitals taken for this visit. General: Well developed white male in no acute distress Head: Normocephalic and  atraumatic Eyes:  sclerae anicteric,conjunctive pink. Ears: Normal auditory acuity Neck: Supple, no masses.  Lungs: Clear throughout to auscultation Heart: Regular rate and rhythm Abdomen: Soft, nontender, non distended. No masses or hepatomegaly noted. Normal bowel sounds Rectal: *** Musculoskeletal: Symmetrical with no gross deformities  Skin: No lesions on visible extremities Extremities: No edema  Neurological: Alert oriented x 4, grossly nonfocal Cervical Nodes:  No significant cervical adenopathy Psychological:  Alert and cooperative. Normal mood and affect  ASSESSMENT AND PLAN:    CC:  [name], MD

## 2022-02-03 NOTE — Progress Notes (Signed)
02/03/2022 Jeffery [name] [id number] Jun 25, 1941   HISTORY OF PRESENT ILLNESS: This is a pleasant 81 year old male who is a patient of Dr. Woodward Ku.  He is here today to discuss surveillance colonoscopy.  He has only ever had one colonoscopy in May 2017 as listed below.  He was initially placed in for a 5-year colonoscopy recall, but then due to his age and other medical problems was recommended that he have an office visit to discuss this further first.  He is a retired Music therapist.  Since his colonoscopy in 2017 he has been diagnosed with some heart block and heart failure issues and now has a pacemaker, but all of those issues are stable.  Has some neck issues and other orthopedic issues that have required treatment with pain medication over the years.  He says that from a GI standpoint he feels well.  He battles with occasional constipation, but uses stool softeners intermittently and seems to manage that for the most part.  Sees very rare bright red blood on the toilet paper that he associates with his constipation issues.  No other change in bowel habits, unintentional weight loss, etc.  Colonoscopy 04/2016:  - Three 3 to 6 mm polyps in the rectum, removed with a cold snare. Resected and retrieved. - Non-bleeding internal hemorrhoids. - Diverticulosis in the sigmoid colon.  Surgical [P], rectum, polyp (3) - TUBULAR ADENOMA. - HYPERPLASTIC POLYP(S). - HIGH GRADE DYSPLASIA IS NOT IDENTIFIED.   Past Medical History:  Diagnosis Date   Arthritis    "thumbs" (07/25/2018)   Asthma    CHF (congestive heart failure) (HCC)    Chronic bronchitis (HCC)    Environmental allergies    GERD (gastroesophageal reflux disease)    Headache    "seasonal; w/environmental allergies" (07/25/2018)   History of blood transfusion    "when I had laminectomy" (07/25/2018)   HTN (hypertension)    Hyperlipidemia    LBBB (left bundle branch block) 1999   Metatarsal bone fracture 03/07/2018    Myocardial infarction [facility])    "was told I've had an old MI; probably in the 44s" (07/25/2018)   Pneumonia    "as a child, age 41; viral pneumonia 3 times in the last 10 years" (07/25/2018)   Presence of permanent cardiac pacemaker 07/25/2018   Seasonal allergies    Sleep apnea    "wife says I do" (07/25/2018)   Type II diabetes mellitus ([place])    Past Surgical History:  Procedure Laterality Date   BACK SURGERY     BIV PACEMAKER INSERTION CRT-P  07/25/2018   BIV PACEMAKER INSERTION CRT-P N/A 07/25/2018   Procedure: BIV PACEMAKER INSERTION CRT-P;  Surgeon: Evans Lance, MD;  Location: [place] [facility];  Service: Cardiovascular;  Laterality: N/A;   CARDIAC CATHETERIZATION  2003   Dr Pernell Dupre; 85 % R circumflex obstruction   CARPAL TUNNEL RELEASE Left 10/11/2014   Procedure: LEFT CARPAL TUNNEL RELEASE;  Surgeon: Roseanne Kaufman, MD;  Location: [place];  Service: Orthopedics;  Laterality: Left;   COLONOSCOPY W/ BIOPSIES AND POLYPECTOMY  2018   INGUINAL HERNIA REPAIR Right 1948   INGUINAL HERNIA REPAIR Left 1988   LUMBAR LAMINECTOMY  1984   MINOR CARPAL TUNNEL Right 11/22/2014   Procedure: RIGHT LIMITED OPEN CARPAL TUNNEL RELEASE;  Surgeon: Roseanne Kaufman, MD;  Location: [place];  Service: Orthopedics;  Laterality: Right;   TONSILLECTOMY  1958    reports that he quit smoking about 58  years ago. His smoking use included cigarettes. He has a 4.00 pack-year smoking history. He has never used smokeless tobacco. He reports that he does not currently use alcohol after a past usage of about 2.0 standard drinks per week. He reports that he does not use drugs. family history includes Colon polyps in his father; Diabetes in his maternal aunt; Heart attack in his father; Heart failure in his mother; Hypertension in his mother; Subarachnoid hemorrhage (age of onset: 21) in his mother; Subarachnoid hemorrhage (age of onset: 56) in his paternal grandfather. Allergies   Allergen Reactions   Contrast Media [Iodinated Contrast Media] Other (See Comments)    Redness and warm sensation, this reaction was noted on 02/27/13 during a Cardiac MRI per pt.  Pt sts he had erythema on his head, chest, and shoulders.  Pt had a pacemaker placed August 2019 and was pre-medicated for the dye and had no allergic reaction at that time. -Carissa Mozingo B.S. RT(R)(CT)      Outpatient Encounter Medications as of 02/03/2022  Medication Sig   ACCU-CHEK AVIVA PLUS test strip USE AS INSTRUCTED TO TEST BLOOD SUGAR 6 TIMES DAILY. DX CODE: E11.59   ACCU-CHEK SOFTCLIX LANCETS lancets Use to test blood sugar daily as directed.   albuterol (PROAIR HFA) 108 (90 Base) MCG/ACT inhaler Inhale 2 puffs into the lungs every 6 (six) hours as needed for wheezing or shortness of breath.   albuterol (PROVENTIL) (2.5 MG/3ML) 0.083% nebulizer solution TAKE 3 MLS BY NEBULIZATION EVERY 6 (SIX) HOURS AS NEEDED FOR WHEEZING OR SHORTNESS OF BREATH.   aspirin 81 MG tablet Take 81 mg by mouth at bedtime.   celecoxib (CELEBREX) 100 MG capsule TAKE 1 CAPSULE BY MOUTH EVERY DAY   Continuous Blood Gluc Receiver (FREESTYLE LIBRE READER) DEVI 1 Device by Does not apply route every 14 (fourteen) days. Freestyle libre 3 reader   Continuous Blood Gluc Sensor (FREESTYLE LIBRE 14 DAY SENSOR) MISC APPLY 1 SENSOR EVERY 14 DAYS   famotidine (PEPCID) 20 MG tablet Take 1 tablet (20 mg total) by mouth daily.   fexofenadine (ALLEGRA) 180 MG tablet Take 180 mg by mouth daily as needed for allergies.    gabapentin (NEURONTIN) 100 MG capsule TAKE 2 CAPSULES BY MOUTH 3 TIMES DAILY.   HYDROcodone-acetaminophen (NORCO) 7.5-325 MG tablet Take 1 tablet by mouth every 6 (six) hours as needed for severe pain (for chronic joint pain, neck pain).   LORazepam (ATIVAN) 1 MG tablet TAKE 1/2 TO 1 TABLET BY MOUTH DAILY AS NEEDED   metFORMIN (GLUCOPHAGE) 500 MG tablet TAKE 1 TABLET BY MOUTH 2 TIMES DAILY WITH A MEAL.   montelukast (SINGULAIR)  10 MG tablet TAKE 1 TABLET BY MOUTH AT  BEDTIME   Multiple Vitamin (MULTIVITAMIN WITH MINERALS) TABS tablet Take 1 tablet by mouth daily.   nebivolol (BYSTOLIC) 5 MG tablet Take 1 tablet (5 mg total) by mouth daily. Please keep upcoming appointment for further refills   nitroGLYCERIN (NITROSTAT) 0.4 MG SL tablet PLACE 1 TABLET (0.4 MG TOTAL) UNDER THE TONGUE EVERY 5 (FIVE) MINUTES X 3 DOSES AS NEEDED FOR CHEST PAIN.   Respiratory Therapy Supplies (FLUTTER) DEVI Twice a day and prn as needed, may increase if feeling worse   rosuvastatin (CRESTOR) 10 MG tablet TAKE 1 TABLET ON MONDAY, [place]. PLEASE MAKE OVERDUE APPT WITH DR. Tamala Julian BEFORE ANYMORE REFILLS. THANK YOU 2ND ATTEMPT   SYMBICORT 160-4.5 MCG/ACT inhaler USE 1 TO 2 INHALATIONS BY  MOUTH EVERY 12 HOURS AS  NEEDED  vardenafil (LEVITRA) 20 MG tablet TAKE 1 TABLET BY MOUTH AS DIRECTED. CAN NOT BE TAKEN WITH NITROGLYCERIN. NOT COVERED BY INSURANCE   zinc gluconate 50 MG tablet Take 50 mg by mouth daily.   No facility-administered encounter medications on file as of 02/03/2022.     REVIEW OF SYSTEMS  : All other systems reviewed and negative except where noted in the History of Present Illness.   PHYSICAL EXAM: BP 130/76    Pulse 72    Ht 5' 9.5" (1.765 m)    Wt 195 lb 3.2 oz (88.5 kg)    SpO2 97%    BMI 28.41 kg/m  General: Well developed white male in no acute distress Head: Normocephalic and atraumatic Eyes:  Sclerae anicteric, conjunctiva pink. Ears: Normal auditory acuity Lungs: Clear throughout to auscultation; no W/R/R. Heart: Regular rate and rhythm; no M/R/G. Abdomen: Soft, non-distended.  BS present.  Non-tender. Musculoskeletal: Symmetrical with no gross deformities  Skin: No lesions on visible extremities Extremities: No edema  Neurological: Alert oriented x 4, grossly non-focal Psychological:  Alert and cooperative. Normal mood and affect  ASSESSMENT AND PLAN: *Personal history of colon polyps:  Only  colonoscopy was in May 2017 at which time he had 3 polyps removed, looks like just 1 of which was a tubular adenoma.  These were all 6 mm or less in size.  Technically could plan for surveillance to 7 years instead of 5.  Nonetheless, he came in to discuss colonoscopy due to his age and other medical issues.  After long discussion, we decided to forgo colonoscopy for now based on the new guidelines as well as his advancing age and other medical problems.  I think if it came down to it that he would tolerate a colonoscopy just fine, but he is a retired Music therapist and knows the risks that can come with that as well.  He will contact our office for any other issues but, but we will not plan to proceed with any other colonoscopy surveillance for now.   CC:  [name], MD

## 2022-02-03 NOTE — Patient Instructions (Signed)
Follow up as needed.   If you are age 79 or older, your body mass index should be between 23-30. Your Body mass index is 28.41 kg/m. If this is out of the aforementioned range listed, please consider follow up with your Primary Care Provider.  If you are age 13 or younger, your body mass index should be between 19-25. Your Body mass index is 28.41 kg/m. If this is out of the aformentioned range listed, please consider follow up with your Primary Care Provider.   ________________________________________________________  [facility] providers would like to encourage you to use [facility] to communicate with providers for non-urgent requests or questions.  Due to long hold times on the telephone, sending your provider a message by [facility] may be a faster and more efficient way to get a response.  Please allow 48 business hours for a response.  Please remember that this is for non-urgent requests.  _______________________________________________________

## 2022-02-04 NOTE — Progress Notes (Signed)
Remote pacemaker transmission.   

## 2022-02-21 ENCOUNTER — Other Ambulatory Visit: Payer: Self-pay | Admitting: Interventional Cardiology

## 2022-02-21 ENCOUNTER — Other Ambulatory Visit: Payer: Self-pay | Admitting: Internal Medicine

## 2022-02-21 ENCOUNTER — Other Ambulatory Visit: Payer: Self-pay | Admitting: Family Medicine

## 2022-02-24 ENCOUNTER — Other Ambulatory Visit: Payer: Self-pay

## 2022-02-24 MED ORDER — NEBIVOLOL HCL 5 MG PO TABS: 5.0000 mg | ORAL_TABLET | Freq: Every day | ORAL | 0 refills | Status: DC

## 2022-02-24 NOTE — Progress Notes (Signed)
Refilled nebivolo just enough till his appointment  ?

## 2022-02-26 ENCOUNTER — Other Ambulatory Visit: Payer: Self-pay | Admitting: Internal Medicine

## 2022-02-26 MED ORDER — HYDROCODONE-ACETAMINOPHEN 7.5-325 MG PO TABS: 1.0000 | ORAL_TABLET | Freq: Four times a day (QID) | ORAL | 0 refills | Status: DC | PRN

## 2022-03-03 DIAGNOSIS — D485 Neoplasm of uncertain behavior of skin: Secondary | ICD-10-CM | POA: Diagnosis not present

## 2022-03-03 DIAGNOSIS — L570 Actinic keratosis: Secondary | ICD-10-CM | POA: Diagnosis not present

## 2022-03-03 DIAGNOSIS — C44519 Basal cell carcinoma of skin of other part of trunk: Secondary | ICD-10-CM | POA: Diagnosis not present

## 2022-03-03 DIAGNOSIS — D225 Melanocytic nevi of trunk: Secondary | ICD-10-CM | POA: Diagnosis not present

## 2022-03-03 DIAGNOSIS — L814 Other melanin hyperpigmentation: Secondary | ICD-10-CM | POA: Diagnosis not present

## 2022-03-03 DIAGNOSIS — L821 Other seborrheic keratosis: Secondary | ICD-10-CM | POA: Diagnosis not present

## 2022-03-03 DIAGNOSIS — C44329 Squamous cell carcinoma of skin of other parts of face: Secondary | ICD-10-CM | POA: Diagnosis not present

## 2022-03-06 ENCOUNTER — Other Ambulatory Visit: Payer: Self-pay | Admitting: Interventional Cardiology

## 2022-03-15 ENCOUNTER — Ambulatory Visit: Payer: Medicare Other

## 2022-03-15 NOTE — Progress Notes (Signed)
? ?  Chronic Care Management ?Pharmacy Note ? ?03/15/2022 ?Name:  DEAKON FRIX MRN:  [id number] DOB:  25-Jul-1941 ? ?Spoke with patient, unable to have appointment today, rescheduled for 6 months per patient request  ? ?Tomasa Blase, PharmD ?Clinical Pharmacist, [place]  ? ? ?

## 2022-03-16 ENCOUNTER — Ambulatory Visit: Payer: Medicare Other | Admitting: Internal Medicine

## 2022-03-16 NOTE — Progress Notes (Signed)
?Cardiology Office Note:   ? ?Date:  03/17/2022  ? ?ID:  Jeffery [name], DOB 12-Jun-1941, MRN [id number] ? ?PCP:  [name], MD  ?Cardiologist:  Sinclair Grooms, MD  ? ?Referring MD: [name], MD  ? ?No chief complaint on file. ? ? ?History of Present Illness:   ? ?Jeffery [name] is a 81 y.o. male with a hx of  left bundle branch block, primary hypertension, hyperlipidemia, high grade AV block, obstructive sleep apnea, chronic combined systolic and diastolic HF, diabetes mellitus II, obesity and recent hospital stay for dyspnea and hypoxia(2020). ?  ? ? ?Denies angina, orthopnea, PND, lower extremity swelling.  Decreased energy compared to prior.  Feels this is related to age.  Awakens in the morning feeling somewhat tired.  Says he sleeps okay.  No exertional related chest discomfort.  No palpitations or syncope. ? ?Past Medical History:  ?Diagnosis Date  ? Arthritis   ? "thumbs" (07/25/2018)  ? Asthma   ? CHF (congestive heart failure) ([place])   ? Chronic bronchitis ([place])   ? Environmental allergies   ? GERD (gastroesophageal reflux disease)   ? Headache   ? "seasonal; w/environmental allergies" (07/25/2018)  ? History of blood transfusion   ? "when I had laminectomy" (07/25/2018)  ? HTN (hypertension)   ? Hyperlipidemia   ? LBBB (left bundle branch block) 1999  ? Metatarsal bone fracture 03/07/2018  ? Myocardial infarction [facility])   ? "was told I've had an old MI; probably in the 27s" (07/25/2018)  ? Pneumonia   ? "as a child, age 78; viral pneumonia 3 times in the last 10 years" (07/25/2018)  ? Presence of permanent cardiac pacemaker 07/25/2018  ? Seasonal allergies   ? Sleep apnea   ? "wife says I do" (07/25/2018)  ? Type II diabetes mellitus ([place])   ? ? ?Past Surgical History:  ?Procedure Laterality Date  ? BACK SURGERY    ? BIV PACEMAKER INSERTION CRT-P  07/25/2018  ? BIV PACEMAKER INSERTION CRT-P N/A 07/25/2018  ? Procedure: BIV PACEMAKER INSERTION CRT-P;  Surgeon: Evans Lance, MD;  Location: [place] [facility];  Service: Cardiovascular;  Laterality: N/A;  ? CARDIAC CATHETERIZATION  2003  ? Dr Pernell Dupre; 85 % R circumflex obstruction  ? CARPAL TUNNEL RELEASE Left 10/11/2014  ? Procedure: LEFT CARPAL TUNNEL RELEASE;  Surgeon: Roseanne Kaufman, MD;  Location: [place];  Service: Orthopedics;  Laterality: Left;  ? COLONOSCOPY W/ BIOPSIES AND POLYPECTOMY  2018  ? INGUINAL HERNIA REPAIR Right 1948  ? INGUINAL HERNIA REPAIR Left 1988  ? LUMBAR LAMINECTOMY  1984  ? MINOR CARPAL TUNNEL Right 11/22/2014  ? Procedure: RIGHT LIMITED OPEN CARPAL TUNNEL RELEASE;  Surgeon: Roseanne Kaufman, MD;  Location: [place];  Service: Orthopedics;  Laterality: Right;  ? TONSILLECTOMY  1958  ? ? ?Current Medications: ?Current Meds  ?Medication Sig  ? ACCU-CHEK AVIVA PLUS test strip USE AS INSTRUCTED TO TEST BLOOD SUGAR 6 TIMES DAILY. DX CODE: E11.59  ? ACCU-CHEK SOFTCLIX LANCETS lancets Use to test blood sugar daily as directed.  ? albuterol (PROAIR HFA) 108 (90 Base) MCG/ACT inhaler Inhale 2 puffs into the lungs every 6 (six) hours as needed for wheezing or shortness of breath.  ? albuterol (PROVENTIL) (2.5 MG/3ML) 0.083% nebulizer solution TAKE 3 MLS BY NEBULIZATION EVERY 6 (SIX) HOURS AS NEEDED FOR WHEEZING OR SHORTNESS OF BREATH.  ? aspirin 81 MG tablet Take 81 mg by mouth at  bedtime.  ? celecoxib (CELEBREX) 100 MG capsule TAKE 1 CAPSULE BY MOUTH EVERY DAY  ? Continuous Blood Gluc Receiver (FREESTYLE LIBRE READER) DEVI 1 Device by Does not apply route every 14 (fourteen) days. Freestyle libre 3 reader  ? Continuous Blood Gluc Sensor (FREESTYLE LIBRE 14 DAY SENSOR) MISC APPLY 1 SENSOR EVERY 14 DAYS  ? fexofenadine (ALLEGRA) 180 MG tablet Take 180 mg by mouth daily as needed for allergies.   ? gabapentin (NEURONTIN) 100 MG capsule TAKE 2 CAPSULES BY MOUTH 3 TIMES DAILY.  ? HYDROcodone-acetaminophen (NORCO) 7.5-325 MG tablet Take 1 tablet by mouth every 6 (six) hours as needed for severe pain (for  chronic joint pain, neck pain).  ? LORazepam (ATIVAN) 1 MG tablet TAKE 1/2 TO 1 TABLET BY MOUTH DAILY AS NEEDED  ? metFORMIN (GLUCOPHAGE) 500 MG tablet TAKE 1 TABLET BY MOUTH 2 TIMES DAILY WITH A MEAL.  ? montelukast (SINGULAIR) 10 MG tablet TAKE 1 TABLET BY MOUTH AT  BEDTIME  ? Multiple Vitamin (MULTIVITAMIN WITH MINERALS) TABS tablet Take 1 tablet by mouth daily.  ? nebivolol (BYSTOLIC) 5 MG tablet TAKE 1 TABLET BY MOUTH  DAILY  ? nebivolol (BYSTOLIC) 5 MG tablet Take 1 tablet (5 mg total) by mouth daily. Please keep upcoming appointment for further refills  ? nitroGLYCERIN (NITROSTAT) 0.4 MG SL tablet PLACE 1 TABLET (0.4 MG TOTAL) UNDER THE TONGUE EVERY 5 (FIVE) MINUTES X 3 DOSES AS NEEDED FOR CHEST PAIN.  ? Respiratory Therapy Supplies (FLUTTER) DEVI Twice a day and prn as needed, may increase if feeling worse  ? rosuvastatin (CRESTOR) 10 MG tablet TAKE 1 TABLET ON MONDAY, [place]. Please keep upcoming appt. With Dr. Tamala Julian in April in order to receive further refills. Thank You.  ? SYMBICORT 160-4.5 MCG/ACT inhaler USE 1 TO 2 INHALATIONS BY  MOUTH EVERY 12 HOURS AS  NEEDED  ? vardenafil (LEVITRA) 20 MG tablet TAKE 1 TABLET BY MOUTH AS DIRECTED. CAN NOT BE TAKEN WITH NITROGLYCERIN. NOT COVERED BY INSURANCE  ? zinc gluconate 50 MG tablet Take 50 mg by mouth daily.  ?  ? ?Allergies:   Contrast media [iodinated contrast media] and Iodine  ? ?Social History  ? ?Socioeconomic History  ? Marital status: Married  ?  Spouse name: Not on file  ? Number of children: 3  ? Years of education: Not on file  ? Highest education level: Professional school degree (e.g., MD, DDS, DVM, JD)  ?Occupational History  ? Occupation: retired Music therapist  ?  Employer: [employer]  ?Tobacco Use  ? Smoking status: Former  ?  Packs/day: 2.00  ?  Years: 2.00  ?  Pack years: 4.00  ?  Types: Cigarettes  ?  Quit date: 12/14/1963  ?  Years since quitting: 58.2  ? Smokeless tobacco: Never  ?Vaping Use  ? Vaping Use: Never used   ?Substance and Sexual Activity  ? Alcohol use: Not Currently  ?  Alcohol/week: 2.0 standard drinks  ?  Types: 2 Glasses of wine per week  ?  Comment: occasional  ? Drug use: Never  ? Sexual activity: Yes  ?Other Topics Concern  ? Not on file  ?Social History Narrative  ? Lives with wife in a 2 story home.  Has 3 daughters.  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    ? ?Social Determinants of Health  ? ?Financial Resource Strain: Low Risk   ? Difficulty of Paying Living Expenses: Not hard at all  ?Food Insecurity: No Food  Insecurity  ? Worried About Charity fundraiser in the Last Year: Never true  ? Ran Out of Food in the Last Year: Never true  ?Transportation Needs: No Transportation Needs  ? Lack of Transportation (Medical): No  ? Lack of Transportation (Non-Medical): No  ?Physical Activity: Sufficiently Active  ? Days of Exercise per Week: 5 days  ? Minutes of Exercise per Session: 30 min  ?Stress: No Stress Concern Present  ? Feeling of Stress : Not at all  ?Social Connections: Socially Integrated  ? Frequency of Communication with Friends and Family: More than three times a week  ? Frequency of Social Gatherings with Friends and Family: More than three times a week  ? Attends Religious Services: More than 4 times per year  ? Active Member of Clubs or Organizations: Yes  ? Attends Archivist Meetings: More than 4 times per year  ? Marital Status: Married  ?  ? ?Family History: ?The patient's family history includes Colon polyps in his father; Diabetes in his maternal aunt; Heart attack in his father; Heart failure in his mother; Hypertension in his mother; Subarachnoid hemorrhage (age of onset: 108) in his mother; Subarachnoid hemorrhage (age of onset: 64) in his paternal grandfather. There is no history of Colon cancer. ? ?ROS:   ?Please see the history of present illness.    ?Cervical spine disease and significant pain.  Inoperable all other systems reviewed and are negative. ? ?EKGs/Labs/Other Studies  Reviewed:   ? ?The following studies were reviewed today: ?He has not had reevaluation of EF in 4 years.  Last EF was 40 to 45% 2019. ?2D Doppler echocardiogram 2019: ?Study Conclusions  ? ?- Left ventricle: The

## 2022-03-17 ENCOUNTER — Ambulatory Visit: Payer: Medicare Other | Admitting: Interventional Cardiology

## 2022-03-17 ENCOUNTER — Encounter: Payer: Self-pay | Admitting: Interventional Cardiology

## 2022-03-17 VITALS — BP 128/72 | HR 68 | Ht 69.5 in | Wt 193.0 lb

## 2022-03-17 DIAGNOSIS — I447 Left bundle-branch block, unspecified: Secondary | ICD-10-CM | POA: Diagnosis not present

## 2022-03-17 DIAGNOSIS — I441 Atrioventricular block, second degree: Secondary | ICD-10-CM | POA: Diagnosis not present

## 2022-03-17 DIAGNOSIS — I5022 Chronic systolic (congestive) heart failure: Secondary | ICD-10-CM

## 2022-03-17 DIAGNOSIS — G4733 Obstructive sleep apnea (adult) (pediatric): Secondary | ICD-10-CM | POA: Diagnosis not present

## 2022-03-17 DIAGNOSIS — E785 Hyperlipidemia, unspecified: Secondary | ICD-10-CM

## 2022-03-17 DIAGNOSIS — I2510 Atherosclerotic heart disease of native coronary artery without angina pectoris: Secondary | ICD-10-CM

## 2022-03-17 DIAGNOSIS — Z950 Presence of cardiac pacemaker: Secondary | ICD-10-CM

## 2022-03-17 LAB — LIPID PANEL
Chol/HDL Ratio: 3.3 ratio (ref 0.0–5.0)
Cholesterol, Total: 122 mg/dL (ref 100–199)
HDL: 37 mg/dL — ABNORMAL LOW (ref >39)
LDL Chol Calc (NIH): 66 mg/dL (ref 0–99)
Triglycerides: 103 mg/dL (ref 0–149)
VLDL Cholesterol Cal: 19 mg/dL (ref 5–40)

## 2022-03-17 LAB — HEPATIC FUNCTION PANEL
ALT: 29 IU/L (ref 0–44)
AST: 26 IU/L (ref 0–40)
Albumin: 4.7 g/dL (ref 3.7–4.7)
Alkaline Phosphatase: 82 IU/L (ref 44–121)
Bilirubin Total: 0.4 mg/dL (ref 0.0–1.2)
Bilirubin, Direct: 0.14 mg/dL (ref 0.00–0.40)
Total Protein: 6.8 g/dL (ref 6.0–8.5)

## 2022-03-17 NOTE — Patient Instructions (Addendum)
Medication Instructions:  ?Your physician recommends that you continue on your current medications as directed. Please refer to the Current Medication list given to you today. ? ?*If you need a refill on your cardiac medications before your next appointment, please call your pharmacy* ? ? ?Lab Work: ?Lipid and Liver today ? ?If you have labs (blood work) drawn today and your tests are completely normal, you will receive your results only by: ?MyChart Message (if you have MyChart) OR ?A paper copy in the mail ?If you have any lab test that is abnormal or we need to change your treatment, we will call you to review the results. ? ? ?Testing/Procedures: ? ?Your physician has requested that you have an echocardiogram. Echocardiography is a painless test that uses sound waves to create images of your heart. It provides your doctor with information about the size and shape of your heart and how well your heart?s chambers and valves are working. This procedure takes approximately one hour. There are no restrictions for this procedure. ? ? ?Follow-Up: ?At [facility], you and your health needs are our priority.  As part of our continuing mission to provide you with exceptional heart care, we have created designated Provider Care Teams.  These Care Teams include your primary Cardiologist (physician) and Advanced Practice Providers (APPs -  Physician Assistants and Nurse Practitioners) who all work together to provide you with the care you need, when you need it. ? ?We recommend signing up for the patient portal called "MyChart".  Sign up information is provided on this After Visit Summary.  MyChart is used to connect with patients for Virtual Visits (Telemedicine).  Patients are able to view lab/test results, encounter notes, upcoming appointments, etc.  Non-urgent messages can be sent to your provider as well.   ?To learn more about what you can do with MyChart, go to [email or web address].   ? ?Your next appointment:    ?1 year(s) ? ?The format for your next appointment:   ?In Person ? ?Provider:   ?Sinclair Grooms, MD  ? ? ?Other Instructions ?  ?

## 2022-03-22 ENCOUNTER — Encounter: Payer: Self-pay | Admitting: Internal Medicine

## 2022-03-22 NOTE — Patient Instructions (Addendum)
? ? ? ?  Blood work was ordered.   ? ? ? ?Medications changes include :   prednisone 40 mg daily x 5 days ir needed ? ? ?Your prescriptions [facility].   ? ? ? ?Return in about 6 months (around 09/22/2022) for follow up. ? ?

## 2022-03-22 NOTE — Progress Notes (Signed)
? ? ? ? ?Subjective:  ? ? Patient ID: Jeffery [name], male    DOB: [birth year], 81 y.o.   MRN: [id number] ? ?This visit occurred during the SARS-CoV-2 public health emergency.  Safety protocols were in place, including screening questions prior to the visit, additional usage of staff PPE, and extensive cleaning of exam room while observing appropriate contact time as indicated for disinfecting solutions.   ? ? ?HPI ?Jeffery [name] is here for follow up of his chronic medical problems, including DM, htn, chronic pain management, combined HF, hld, sleep difficulties, asthma ? ?The past few nights he has been having a lot of allergies and asthma flare the past few nights.  He has been coughing a lot up and needing to do neb treatments regularly.   ? ?Still neck pain.  Pain can radiates from neck to orbit.   ? ?Exercise - yard work.   ? ? ?Medications and allergies reviewed with patient and updated if appropriate. ? ?Current Outpatient Medications on File Prior to Visit  ?Medication Sig Dispense Refill  ? ACCU-CHEK AVIVA PLUS test strip USE AS INSTRUCTED TO TEST BLOOD SUGAR 6 TIMES DAILY. DX CODE: E11.59 500 strip 4  ? ACCU-CHEK SOFTCLIX LANCETS lancets Use to test blood sugar daily as directed. 100 each 3  ? albuterol (PROAIR HFA) 108 (90 Base) MCG/ACT inhaler Inhale 2 puffs into the lungs every 6 (six) hours as needed for wheezing or shortness of breath. 6.7 g 11  ? albuterol (PROVENTIL) (2.5 MG/3ML) 0.083% nebulizer solution TAKE 3 MLS BY NEBULIZATION EVERY 6 (SIX) HOURS AS NEEDED FOR WHEEZING OR SHORTNESS OF BREATH. 150 mL 5  ? aspirin 81 MG tablet Take 81 mg by mouth at bedtime.    ? celecoxib (CELEBREX) 100 MG capsule TAKE 1 CAPSULE BY MOUTH EVERY DAY 90 capsule 0  ? Continuous Blood Gluc Receiver (FREESTYLE LIBRE READER) DEVI 1 Device by Does not apply route every 14 (fourteen) days. Freestyle libre 3 reader 1 each 0  ? Continuous Blood Gluc Sensor (FREESTYLE LIBRE 14 DAY SENSOR) MISC APPLY 1 SENSOR EVERY 14 DAYS 2 each 2   ? fexofenadine (ALLEGRA) 180 MG tablet Take 180 mg by mouth daily as needed for allergies.     ? gabapentin (NEURONTIN) 100 MG capsule TAKE 2 CAPSULES BY MOUTH 3 TIMES DAILY. 180 capsule 3  ? HYDROcodone-acetaminophen (NORCO) 7.5-325 MG tablet Take 1 tablet by mouth every 6 (six) hours as needed for severe pain (for chronic joint pain, neck pain). 90 tablet 0  ? LORazepam (ATIVAN) 1 MG tablet TAKE 1/2 TO 1 TABLET BY MOUTH DAILY AS NEEDED 30 tablet 0  ? metFORMIN (GLUCOPHAGE) 500 MG tablet TAKE 1 TABLET BY MOUTH 2 TIMES DAILY WITH A MEAL. 180 tablet 1  ? montelukast (SINGULAIR) 10 MG tablet TAKE 1 TABLET BY MOUTH AT  BEDTIME 90 tablet 3  ? Multiple Vitamin (MULTIVITAMIN WITH MINERALS) TABS tablet Take 1 tablet by mouth daily.    ? nebivolol (BYSTOLIC) 5 MG tablet TAKE 1 TABLET BY MOUTH  DAILY 90 tablet 3  ? nitroGLYCERIN (NITROSTAT) 0.4 MG SL tablet PLACE 1 TABLET (0.4 MG TOTAL) UNDER THE TONGUE EVERY 5 (FIVE) MINUTES X 3 DOSES AS NEEDED FOR CHEST PAIN. 75 tablet 1  ? Respiratory Therapy Supplies (FLUTTER) DEVI Twice a day and prn as needed, may increase if feeling worse 1 each 0  ? rosuvastatin (CRESTOR) 10 MG tablet TAKE 1 TABLET ON MONDAY, [place]. Please keep upcoming appt. With Dr. Tamala Julian  in April in order to receive further refills. Thank You. 45 tablet 0  ? SYMBICORT 160-4.5 MCG/ACT inhaler USE 1 TO 2 INHALATIONS BY  MOUTH EVERY 12 HOURS AS  NEEDED 30.6 g 3  ? vardenafil (LEVITRA) 20 MG tablet TAKE 1 TABLET BY MOUTH AS DIRECTED. CAN NOT BE TAKEN WITH NITROGLYCERIN. NOT COVERED BY INSURANCE 6 tablet 2  ? zinc gluconate 50 MG tablet Take 50 mg by mouth daily.    ? famotidine (PEPCID) 20 MG tablet Take 1 tablet (20 mg total) by mouth daily. 30 tablet 0  ? ?No current facility-administered medications on file prior to visit.  ? ? ? ?Review of Systems  ?Constitutional:  Negative for fever.  ?HENT:  Positive for postnasal drip (allergy related).   ?Respiratory:  Positive for cough and wheezing (at night).  Negative for shortness of breath.   ?Cardiovascular:  Negative for chest pain, palpitations and leg swelling.  ?Neurological:  Negative for light-headedness and headaches.  ? ?   ?Objective:  ? ?Vitals:  ? 03/23/22 0953  ?BP: 130/82  ?Pulse: 68  ?Temp: 98 ?F (36.7 ?C)  ?SpO2: 94%  ? ?BP Readings from Last 3 Encounters:  ?03/23/22 130/82  ?03/17/22 128/72  ?02/03/22 130/76  ? ?Wt Readings from Last 3 Encounters:  ?03/23/22 194 lb 12.8 oz (88.4 kg)  ?03/17/22 193 lb (87.5 kg)  ?02/03/22 195 lb 3.2 oz (88.5 kg)  ? ?Body mass index is 28.35 kg/m?. ? ?  ?Physical Exam ?Constitutional:   ?   General: He is not in acute distress. ?   Appearance: Normal appearance. He is not ill-appearing.  ?HENT:  ?   Head: Normocephalic and atraumatic.  ?Eyes:  ?   Conjunctiva/sclera: Conjunctivae normal.  ?Cardiovascular:  ?   Rate and Rhythm: Normal rate and regular rhythm.  ?   Heart sounds: Normal heart sounds. No murmur heard. ?Pulmonary:  ?   Effort: Pulmonary effort is normal. No respiratory distress.  ?   Breath sounds: Normal breath sounds. No wheezing or rales.  ?Musculoskeletal:  ?   Right lower leg: No edema.  ?   Left lower leg: No edema.  ?Skin: ?   General: Skin is warm and dry.  ?   Findings: No rash.  ?Neurological:  ?   Mental Status: He is alert. Mental status is at baseline.  ?Psychiatric:     ?   Mood and Affect: Mood normal.  ? ?   ? ?Lab Results  ?Component Value Date  ? WBC 6.8 02/16/2021  ? HGB 14.2 02/16/2021  ? HCT 41.9 02/16/2021  ? PLT 248.0 02/16/2021  ? GLUCOSE 115 (H) 08/18/2021  ? CHOL 122 03/17/2022  ? TRIG 103 03/17/2022  ? HDL 37 (L) 03/17/2022  ? [place] 66 03/17/2022  ? ALT 29 03/17/2022  ? AST 26 03/17/2022  ? NA 138 08/18/2021  ? K 4.7 08/18/2021  ? CL 101 08/18/2021  ? CREATININE 1.09 08/18/2021  ? BUN 18 08/18/2021  ? CO2 31 08/18/2021  ? TSH 1.64 02/16/2021  ? PSA 0.79 08/31/2012  ? HGBA1C 6.8 (H) 08/18/2021  ? MICROALBUR <0.7 02/16/2021  ? ? ? ?Assessment & Plan:  ? ? ?See Problem List for  Assessment and Plan of chronic medical problems.  ? ? ?

## 2022-03-23 ENCOUNTER — Ambulatory Visit (INDEPENDENT_AMBULATORY_CARE_PROVIDER_SITE_OTHER): Payer: Medicare Other | Admitting: Internal Medicine

## 2022-03-23 VITALS — BP 130/82 | HR 68 | Temp 98.0°F | Ht 69.5 in | Wt 194.8 lb

## 2022-03-23 DIAGNOSIS — I10 Essential (primary) hypertension: Secondary | ICD-10-CM | POA: Diagnosis not present

## 2022-03-23 DIAGNOSIS — I5042 Chronic combined systolic (congestive) and diastolic (congestive) heart failure: Secondary | ICD-10-CM

## 2022-03-23 DIAGNOSIS — J45909 Unspecified asthma, uncomplicated: Secondary | ICD-10-CM | POA: Diagnosis not present

## 2022-03-23 DIAGNOSIS — G8929 Other chronic pain: Secondary | ICD-10-CM

## 2022-03-23 DIAGNOSIS — G479 Sleep disorder, unspecified: Secondary | ICD-10-CM

## 2022-03-23 DIAGNOSIS — E1159 Type 2 diabetes mellitus with other circulatory complications: Secondary | ICD-10-CM

## 2022-03-23 DIAGNOSIS — I700 Atherosclerosis of aorta: Secondary | ICD-10-CM

## 2022-03-23 DIAGNOSIS — E782 Mixed hyperlipidemia: Secondary | ICD-10-CM | POA: Diagnosis not present

## 2022-03-23 LAB — BASIC METABOLIC PANEL
BUN: 22 mg/dL (ref 6–23)
CO2: 30 mEq/L (ref 19–32)
Calcium: 9.9 mg/dL (ref 8.4–10.5)
Chloride: 102 mEq/L (ref 96–112)
Creatinine, Ser: 1.04 mg/dL (ref 0.40–1.50)
GFR: 68.73 mL/min (ref >60.00)
Glucose, Bld: 134 mg/dL — ABNORMAL HIGH (ref 70–99)
Potassium: 5.1 mEq/L (ref 3.5–5.1)
Sodium: 138 mEq/L (ref 135–145)

## 2022-03-23 LAB — HEMOGLOBIN A1C: Hgb A1c MFr Bld: 7.2 % — ABNORMAL HIGH (ref 4.6–6.5)

## 2022-03-23 LAB — MICROALBUMIN / CREATININE URINE RATIO
Creatinine,U: 80.6 mg/dL
Microalb Creat Ratio: 2.1 mg/g (ref 0.0–30.0)
Microalb, Ur: 1.7 mg/dL (ref 0.0–1.9)

## 2022-03-23 MED ORDER — LORAZEPAM 1 MG PO TABS: ORAL_TABLET | ORAL | 5 refills | Status: DC

## 2022-03-23 MED ORDER — HYDROCODONE-ACETAMINOPHEN 7.5-325 MG PO TABS: 1.0000 | ORAL_TABLET | Freq: Four times a day (QID) | ORAL | 0 refills | Status: DC | PRN

## 2022-03-23 MED ORDER — PREDNISONE 20 MG PO TABS: 40.0000 mg | ORAL_TABLET | Freq: Every day | ORAL | 0 refills | Status: AC

## 2022-03-23 NOTE — Assessment & Plan Note (Signed)
Chronic ?Following with cardiology ?Euvolemic on exam ?Continue current medications ?

## 2022-03-23 NOTE — Assessment & Plan Note (Signed)
Chronic ?Lab Results  ?Component Value Date  ? HGBA1C 6.8 (H) 08/18/2021  ? ?Sugars controlled controlled ?Check A1c, urine microalbumin today ?Continue metformin 500 mg twice daily ?Stressed regular exercise, diabetic diet ? ? ?

## 2022-03-23 NOTE — Assessment & Plan Note (Signed)
Chronic Regular exercise and healthy diet encouraged Check lipid panel  Continue Crestor 10 mg 3 times weekly 

## 2022-03-23 NOTE — Assessment & Plan Note (Signed)
Chronic ?Controlled, Stable ?Continue Ativan 0.5-1 mg nightly as needed ?

## 2022-03-23 NOTE — Assessment & Plan Note (Signed)
Chronic Blood pressure well controlled CMP Continue Bystolic 5 mg daily 

## 2022-03-23 NOTE — Assessment & Plan Note (Signed)
Chronic ?Continue Crestor 10 mg 3 times a week ?Encouraged healthy diet and regular exercise ?

## 2022-03-23 NOTE — Assessment & Plan Note (Addendum)
Chronic pain from generalized osteoarthritis-multiple joints ?Pain medication well-tolerated and he is not taking medication appropriately ?Continue Norco 7.5-325 mg 1 tablet every 6 hours as needed ?Reviewed [facility] controlled substance database ?Urine tox today ?

## 2022-03-23 NOTE — Assessment & Plan Note (Addendum)
Chronic ?Following with pulmonary ?Typically well controlled, but with mild flare of right now ?Continue Symbicort inhaler twice daily, albuterol inhaler as needed ?Continue singular 10 mg nightly and allergy medications ?I will give him a prescription for prednisone 40 mg daily x5 days to use only if needed-he will only take this if his symptoms get worse or do not improve ?

## 2022-03-26 LAB — DRUG MONITOR, TRAMADOL,QN, URINE
Desmethyltramadol: NEGATIVE ng/mL (ref <100)
Tramadol: NEGATIVE ng/mL (ref <100)

## 2022-03-26 LAB — DRUG MONITOR, OPIATES,W/CONF, URINE
Codeine: NEGATIVE ng/mL (ref <50)
Hydrocodone: 1381 ng/mL — ABNORMAL HIGH (ref <50)
Hydromorphone: 704 ng/mL — ABNORMAL HIGH (ref <50)
Morphine: NEGATIVE ng/mL (ref <50)
Norhydrocodone: 822 ng/mL — ABNORMAL HIGH (ref <50)
Opiates: POSITIVE ng/mL — AB (ref <100)

## 2022-03-26 LAB — DRUG MONITOR, BENZO,W/CONF, URINE
Alphahydroxyalprazolam: NEGATIVE ng/mL (ref <25)
Alphahydroxymidazolam: NEGATIVE ng/mL (ref <50)
Alphahydroxytriazolam: NEGATIVE ng/mL (ref <50)
Aminoclonazepam: NEGATIVE ng/mL (ref <25)
Benzodiazepines: POSITIVE ng/mL — AB (ref <100)
Hydroxyethylflurazepam: NEGATIVE ng/mL (ref <50)
Lorazepam: 285 ng/mL — ABNORMAL HIGH (ref <50)
Nordiazepam: NEGATIVE ng/mL (ref <50)
Oxazepam: NEGATIVE ng/mL (ref <50)
Temazepam: NEGATIVE ng/mL (ref <50)

## 2022-03-26 LAB — DRUG MONITOR,AMPHETAMINE,W/CONF, URINE: Amphetamines: NEGATIVE ng/mL (ref <500)

## 2022-03-26 LAB — PRESCRIBED DRUGS,MEDMATCH(R)

## 2022-03-26 LAB — DRUG MONITOR, COCAINEMETAB, W/CONF, URINE: Cocaine Metabolite: NEGATIVE ng/mL (ref <150)

## 2022-03-26 LAB — DRUG MONITOR,BARBITURATE,W/CONF, URINE: Barbiturates: NEGATIVE ng/mL (ref <300)

## 2022-03-26 LAB — DRUG MONITOR, OXYCODONE,W/CONF, URINE: Oxycodone: NEGATIVE ng/mL (ref <100)

## 2022-03-26 LAB — DM TEMPLATE

## 2022-04-06 ENCOUNTER — Ambulatory Visit (HOSPITAL_COMMUNITY): Payer: Medicare Other | Attending: Cardiology

## 2022-04-06 DIAGNOSIS — I5022 Chronic systolic (congestive) heart failure: Secondary | ICD-10-CM | POA: Diagnosis not present

## 2022-04-06 LAB — ECHOCARDIOGRAM COMPLETE
Area-P 1/2: 3.63 cm2
S' Lateral: 4.2 cm

## 2022-04-22 ENCOUNTER — Other Ambulatory Visit: Payer: Self-pay | Admitting: Internal Medicine

## 2022-04-23 MED ORDER — HYDROCODONE-ACETAMINOPHEN 7.5-325 MG PO TABS: 1.0000 | ORAL_TABLET | Freq: Four times a day (QID) | ORAL | 0 refills | Status: DC | PRN

## 2022-04-23 NOTE — Telephone Encounter (Signed)
Check [place] registry last filled 03/25/2022.Marland KitchenJohny [name] ? ?

## 2022-04-30 ENCOUNTER — Ambulatory Visit (INDEPENDENT_AMBULATORY_CARE_PROVIDER_SITE_OTHER): Payer: Medicare Other

## 2022-04-30 ENCOUNTER — Encounter: Payer: Self-pay | Admitting: Internal Medicine

## 2022-04-30 DIAGNOSIS — I441 Atrioventricular block, second degree: Secondary | ICD-10-CM

## 2022-04-30 LAB — CUP PACEART REMOTE DEVICE CHECK
Battery Remaining Longevity: 48 mo
Battery Remaining Percentage: 53 %
Battery Voltage: 2.98 V
Brady Statistic AP VP Percent: 39 %
Brady Statistic AP VS Percent: 1 %
Brady Statistic AS VP Percent: 60 %
Brady Statistic AS VS Percent: 1 %
Brady Statistic RA Percent Paced: 38 %
Date Time Interrogation Session: 20230519020012
Implantable Lead Implant Date: 20190813
Implantable Lead Implant Date: 20190813
Implantable Lead Implant Date: 20190813
Implantable Lead Location: 753858
Implantable Lead Location: 753859
Implantable Lead Location: 753860
Implantable Pulse Generator Implant Date: 20190813
Lead Channel Impedance Value: 430 Ohm
Lead Channel Impedance Value: 460 Ohm
Lead Channel Impedance Value: 710 Ohm
Lead Channel Pacing Threshold Amplitude: 0.5 V
Lead Channel Pacing Threshold Amplitude: 0.625 V
Lead Channel Pacing Threshold Amplitude: 1.25 V
Lead Channel Pacing Threshold Pulse Width: 0.5 ms
Lead Channel Pacing Threshold Pulse Width: 0.5 ms
Lead Channel Pacing Threshold Pulse Width: 0.5 ms
Lead Channel Sensing Intrinsic Amplitude: 12 mV
Lead Channel Sensing Intrinsic Amplitude: 2.9 mV
Lead Channel Setting Pacing Amplitude: 1.625
Lead Channel Setting Pacing Amplitude: 2.25 V
Lead Channel Setting Pacing Amplitude: 2.5 V
Lead Channel Setting Pacing Pulse Width: 0.5 ms
Lead Channel Setting Pacing Pulse Width: 0.5 ms
Lead Channel Setting Sensing Sensitivity: 5 mV
Pulse Gen Model: 3562
Pulse Gen Serial Number: 9043864

## 2022-04-30 MED ORDER — HYDROCODONE-ACETAMINOPHEN 5-325 MG PO TABS
1.5000 | ORAL_TABLET | Freq: Four times a day (QID) | ORAL | 0 refills | Status: DC | PRN
Start: 2022-04-30 — End: 2022-05-31

## 2022-05-06 NOTE — Progress Notes (Signed)
Remote pacemaker transmission.   

## 2022-05-11 MED ORDER — FREESTYLE LIBRE 14 DAY SENSOR MISC: 2 refills | Status: DC

## 2022-05-11 NOTE — Addendum Note (Signed)
Addended by: Earnstine Regal on: 05/11/2022 10:44 AM   Modules accepted: Orders

## 2022-05-20 ENCOUNTER — Ambulatory Visit (INDEPENDENT_AMBULATORY_CARE_PROVIDER_SITE_OTHER): Payer: Medicare Other

## 2022-05-20 DIAGNOSIS — Z0000 Encounter for general adult medical examination without abnormal findings: Secondary | ICD-10-CM | POA: Diagnosis not present

## 2022-05-20 NOTE — Progress Notes (Signed)
I connected with Jeffery [name] today by telephone and verified that I am speaking with the correct person using two identifiers. Location patient: home Location provider: work Persons participating in the virtual visit: patient, provider.   I discussed the limitations, risks, security and privacy concerns of performing an evaluation and management service by telephone and the availability of in person appointments. I also discussed with the patient that there may be a patient responsible charge related to this service. The patient expressed understanding and verbally consented to this telephonic visit.    Interactive audio and video telecommunications were attempted between this provider and patient, however failed, due to patient having technical difficulties OR patient did not have access to video capability.  We continued and completed visit with audio only.  Some vital signs may be absent or patient reported.   Time Spent with patient on telephone encounter: 30 minutes  Subjective:   Jeffery [name] is a 81 y.o. male who presents for Medicare Annual/Subsequent preventive examination.  Review of Systems     Cardiac Risk Factors include: advanced age (>18mn, >>44women);diabetes mellitus;dyslipidemia;family history of premature cardiovascular disease;male gender     Objective:    There were no vitals filed for this visit. There is no height or weight on file to calculate BMI.     05/20/2022    2:54 PM 05/14/2021    1:51 PM 12/01/2020   10:35 AM 11/26/2020    8:55 AM 12/11/2018    6:00 PM 12/11/2018   10:54 AM 07/25/2018    8:07 PM  Advanced Directives  Does Patient Have a Medical Advance Directive? Yes Yes No Yes Yes Yes Yes  Type of Advance Directive Living will;Healthcare Power of Attorney Living will;Healthcare Power of Attorney   Living will Living will [place]  Does patient want to make changes to medical advance directive? No - Patient  declined No - Patient declined  No - Patient declined No - Patient declined  No - Patient declined  Copy of [place] Chart? No - copy requested No - copy requested     No - copy requested    Current Medications (verified) Outpatient Encounter Medications as of 05/20/2022  Medication Sig   ACCU-CHEK AVIVA PLUS test strip USE AS INSTRUCTED TO TEST BLOOD SUGAR 6 TIMES DAILY. DX CODE: E11.59   ACCU-CHEK SOFTCLIX LANCETS lancets Use to test blood sugar daily as directed.   albuterol (PROAIR HFA) 108 (90 Base) MCG/ACT inhaler Inhale 2 puffs into the lungs every 6 (six) hours as needed for wheezing or shortness of breath.   albuterol (PROVENTIL) (2.5 MG/3ML) 0.083% nebulizer solution TAKE 3 MLS BY NEBULIZATION EVERY 6 (SIX) HOURS AS NEEDED FOR WHEEZING OR SHORTNESS OF BREATH.   aspirin 81 MG tablet Take 81 mg by mouth at bedtime.   celecoxib (CELEBREX) 100 MG capsule TAKE 1 CAPSULE BY MOUTH EVERY DAY   Continuous Blood Gluc Receiver (FREESTYLE LIBRE READER) DEVI 1 Device by Does not apply route every 14 (fourteen) days. Freestyle libre 3 reader   Continuous Blood Gluc Sensor (FREESTYLE LIBRE 14 DAY SENSOR) MISC APPLY 1 SENSOR EVERY 14 DAYS   famotidine (PEPCID) 20 MG tablet Take 1 tablet (20 mg total) by mouth daily.   fexofenadine (ALLEGRA) 180 MG tablet Take 180 mg by mouth daily as needed for allergies.    gabapentin (NEURONTIN) 100 MG capsule TAKE 2 CAPSULES BY MOUTH 3 TIMES DAILY.   HYDROcodone-acetaminophen (NORCO) 7.5-325 MG tablet Take  1 tablet by mouth every 6 (six) hours as needed for severe pain (for chronic joint pain, neck pain).   HYDROcodone-acetaminophen (NORCO/VICODIN) 5-325 MG tablet Take 1.5 tablets by mouth every 6 (six) hours as needed for moderate pain (Chronic joint pain, neck pain).   LORazepam (ATIVAN) 1 MG tablet TAKE 1/2 TO 1 TABLET BY MOUTH NIGHTLY AS NEEDED   metFORMIN (GLUCOPHAGE) 500 MG tablet TAKE 1 TABLET BY MOUTH 2 TIMES DAILY WITH A MEAL.    montelukast (SINGULAIR) 10 MG tablet TAKE 1 TABLET BY MOUTH AT  BEDTIME   Multiple Vitamin (MULTIVITAMIN WITH MINERALS) TABS tablet Take 1 tablet by mouth daily.   nebivolol (BYSTOLIC) 5 MG tablet TAKE 1 TABLET BY MOUTH  DAILY   nitroGLYCERIN (NITROSTAT) 0.4 MG SL tablet PLACE 1 TABLET (0.4 MG TOTAL) UNDER THE TONGUE EVERY 5 (FIVE) MINUTES X 3 DOSES AS NEEDED FOR CHEST PAIN.   Respiratory Therapy Supplies (FLUTTER) DEVI Twice a day and prn as needed, may increase if feeling worse   rosuvastatin (CRESTOR) 10 MG tablet TAKE 1 TABLET ON MONDAY, [place]. Please keep upcoming appt. With Dr. Tamala Julian in April in order to receive further refills. Thank You.   SYMBICORT 160-4.5 MCG/ACT inhaler USE 1 TO 2 INHALATIONS BY  MOUTH EVERY 12 HOURS AS  NEEDED   vardenafil (LEVITRA) 20 MG tablet TAKE 1 TABLET BY MOUTH AS DIRECTED. CAN NOT BE TAKEN WITH NITROGLYCERIN. NOT COVERED BY INSURANCE   zinc gluconate 50 MG tablet Take 50 mg by mouth daily.   No facility-administered encounter medications on file as of 05/20/2022.    Allergies (verified) Contrast media [iodinated contrast media] and Iodine   History: Past Medical History:  Diagnosis Date   Arthritis    "thumbs" (07/25/2018)   Asthma    CHF (congestive heart failure) (HCC)    Chronic bronchitis (HCC)    Environmental allergies    GERD (gastroesophageal reflux disease)    Headache    "seasonal; w/environmental allergies" (07/25/2018)   History of blood transfusion    "when I had laminectomy" (07/25/2018)   HTN (hypertension)    Hyperlipidemia    LBBB (left bundle branch block) 1999   Metatarsal bone fracture 03/07/2018   Myocardial infarction [facility])    "was told I've had an old MI; probably in the 12s" (07/25/2018)   Pneumonia    "as a child, age 8; viral pneumonia 3 times in the last 10 years" (07/25/2018)   Presence of permanent cardiac pacemaker 07/25/2018   Seasonal allergies    Sleep apnea    "wife says I do" (07/25/2018)   Type II  diabetes mellitus ([place])    Past Surgical History:  Procedure Laterality Date   BACK SURGERY     BIV PACEMAKER INSERTION CRT-P  07/25/2018   BIV PACEMAKER INSERTION CRT-P N/A 07/25/2018   Procedure: BIV PACEMAKER INSERTION CRT-P;  Surgeon: Evans Lance, MD;  Location: [facility];  Service: Cardiovascular;  Laterality: N/A;   CARDIAC CATHETERIZATION  2003   Dr Pernell Dupre; 85 % R circumflex obstruction   CARPAL TUNNEL RELEASE Left 10/11/2014   Procedure: LEFT CARPAL TUNNEL RELEASE;  Surgeon: Roseanne Kaufman, MD;  Location: [place];  Service: Orthopedics;  Laterality: Left;   COLONOSCOPY W/ BIOPSIES AND POLYPECTOMY  2018   INGUINAL HERNIA REPAIR Right 1948   INGUINAL HERNIA REPAIR Left 1988   LUMBAR LAMINECTOMY  1984   MINOR CARPAL TUNNEL Right 11/22/2014   Procedure: RIGHT LIMITED OPEN CARPAL  TUNNEL RELEASE;  Surgeon: Roseanne Kaufman, MD;  Location: [place];  Service: Orthopedics;  Laterality: Right;   TONSILLECTOMY  1958   Family History  Problem Relation Age of Onset   Heart attack Father        79s   Colon polyps Father    Heart failure Mother        90s   Subarachnoid hemorrhage Mother 87   Hypertension Mother    Subarachnoid hemorrhage Paternal Grandfather 92   Diabetes Maternal Aunt    Colon cancer Neg Hx    Social History   Socioeconomic History   Marital status: Married    Spouse name: Not on file   Number of children: 3   Years of education: Not on file   Highest education level: Professional school degree (e.g., MD, DDS, DVM, JD)  Occupational History   Occupation: retired Pension scheme manager: [employer]  Tobacco Use   Smoking status: Former    Packs/day: 2.00    Years: 2.00    Total pack years: 4.00    Types: Cigarettes    Quit date: 12/14/1963    Years since quitting: 58.4   Smokeless tobacco: Never  Vaping Use   Vaping Use: Never used  Substance and Sexual Activity   Alcohol use: Not Currently     Alcohol/week: 2.0 standard drinks of alcohol    Types: 2 Glasses of wine per week    Comment: occasional   Drug use: Never   Sexual activity: Yes  Other Topics Concern   Not on file  Social History Narrative   Lives with wife in a 2 story home.  Has 3 daughters.  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     Social Determinants of Health   Financial Resource Strain: Low Risk  (05/20/2022)   Overall Financial Resource Strain (CARDIA)    Difficulty of Paying Living Expenses: Not hard at all  Food Insecurity: No Food Insecurity (05/20/2022)   Hunger Vital Sign    Worried About Running Out of Food in the Last Year: Never true    Ran Out of Food in the Last Year: Never true  Transportation Needs: No Transportation Needs (05/20/2022)   PRAPARE - Hydrologist (Medical): No    Lack of Transportation (Non-Medical): No  Physical Activity: Unknown (05/20/2022)   Exercise Vital Sign    Days of Exercise per Week: Not on file    Minutes of Exercise per Session: 30 min  Stress: No Stress Concern Present (05/20/2022)   [place]    Feeling of Stress : Not at all  Social Connections: [place] (05/20/2022)   Social Connection and Isolation Panel [NHANES]    Frequency of Communication with Friends and Family: More than three times a week    Frequency of Social Gatherings with Friends and Family: More than three times a week    Attends Religious Services: More than 4 times per year    Active Member of [employer] or Organizations: Yes    Attends Music therapist: More than 4 times per year    Marital Status: Married    Tobacco Counseling Counseling given: Not Answered   Clinical Intake:  Pre-visit preparation completed: Yes  Pain : 0-10 Pain Type: Chronic pain Pain Location: Neck (joints) Pain Orientation: Upper Pain Descriptors / Indicators: Discomfort, Constant Pain Onset: More than a  month ago Pain Frequency: Constant Pain Relieving  Factors: HYdrocodone  Pain Relieving Factors: HYdrocodone  BMI - recorded: 28.36 Nutritional Status: BMI 25 -29 Overweight Nutritional Risks: None Diabetes: Yes CBG done?: No Did pt. bring in CBG monitor from home?: No  How often do you need to have someone help you when you read instructions, pamphlets, or other written materials from your doctor or pharmacy?: 1 - Never What is the last grade level you completed in school?: Graduate school; Retired Music therapist  Diabetic? yes  Interpreter Needed?: No  Information entered by :: [employer], LPN.   Activities of Daily Living    05/20/2022    3:00 PM  In your present state of health, do you have any difficulty performing the following activities:  Hearing? 0  Vision? 0  Difficulty concentrating or making decisions? 0  Walking or climbing stairs? 0  Dressing or bathing? 0  Doing errands, shopping? 0  Preparing Food and eating ? N  Using the Toilet? N  In the past six months, have you accidently leaked urine? N  Do you have problems with loss of bowel control? N  Managing your Medications? N  Managing your Finances? N  Housekeeping or managing your Housekeeping? N    Patient Care Team: [name], MD as PCP - General (Internal Medicine) Belva Crome, MD as PCP - Cardiology (Cardiology) Evans Lance, MD as PCP - Electrophysiology (Cardiology) [place], [place] (Neurosurgery) Charlton Haws, [facility] as Pharmacist (Pharmacist) Katy Apo, MD as Consulting Physician (Ophthalmology)  Indicate any recent Medical Services you may have received from other than [facility] providers in the past year (date may be approximate).     Assessment:   This is a routine wellness examination for Jeffery [name].  Hearing/Vision screen Hearing Screening - Comments:: Patient denied any hearing difficulty.   No hearing aids.  Vision Screening -  Comments:: Patient does wear corrective lenses/contacts.  Eye exam done by: Katy Apo, MD.   Dietary issues and exercise activities discussed: Current Exercise Habits: Home exercise routine, Type of exercise: walking;treadmill;stretching;strength training/weights, Time (Minutes): 30, Frequency (Times/Week): 5, Weekly Exercise (Minutes/Week): 150, Intensity: Moderate   Goals Addressed             This Visit's Progress    I'm 81 years old going on 30!!!!        Depression Screen    05/20/2022    2:59 PM 03/23/2022   10:01 AM 05/14/2021    1:50 PM 03/27/2020    2:31 PM 08/01/2019    1:27 PM 03/18/2015    3:42 PM  PHQ 2/9 Scores  PHQ - 2 Score 0 0 0 0 0 0    Fall Risk    05/20/2022    2:56 PM 03/23/2022   10:01 AM 05/14/2021    1:52 PM 03/27/2020    2:31 PM 02/15/2020    8:02 AM  [place] in the past year? 0 0 0 0 1  Number falls in past yr: 0 0 0 0 1  Injury with Fall? 0 0 0 0 0  Risk for fall due to : No Fall Risks No Fall Risks No Fall Risks No Fall Risks   Follow up Falls evaluation completed Falls evaluation completed Falls evaluation completed Education provided     FALL RISK PREVENTION PERTAINING TO THE HOME:  Any stairs in or around the home? Yes  If so, are there any without handrails? No  Home free of loose throw rugs in walkways,  pet beds, electrical cords, etc? Yes  Adequate lighting in your home to reduce risk of falls? Yes   ASSISTIVE DEVICES UTILIZED TO PREVENT FALLS:  Life alert? No  Use of a cane, walker or w/c? No  Grab bars in the bathroom? Yes  Shower chair or bench in shower? Yes  Elevated toilet seat or a handicapped toilet? No   TIMED UP AND GO:  Was the test performed? No .  Length of time to ambulate 10 feet: n/a sec.   Appearance of gait: Patient not evaluated for gait during this visit.  Cognitive Function:        05/20/2022    3:05 PM 03/27/2020    2:32 PM  6CIT Screen  What Year? 0 points 0 points  What month? 0 points 0  points  What time? 0 points 0 points  Count back from 20 0 points 0 points  Months in reverse 0 points 0 points  Repeat phrase 0 points 0 points  Total Score 0 points 0 points    Immunizations Immunization History  Administered Date(s) Administered   Fluad Quad(high Dose 65+) 08/01/2019, 08/08/2021   Influenza Split 08/30/2012   Influenza, High Dose Seasonal PF 08/31/2017, 08/08/2020, 09/04/2021   Influenza,inj,Quad PF,6+ Mos 10/09/2013, 09/12/2015   Influenza,trivalent, recombinat, inj, PF 08/21/2018   Influenza-Unspecified 09/29/2014, 09/02/2017, 08/08/2020   Moderna Sars-Covid-2 Vaccination 09/12/2018, 01/25/2020, 03/03/2020   PFIZER Comirnaty(Gray Top)Covid-19 Tri-Sucrose Vaccine 05/26/2021   Pfizer Covid-19 Vaccine Bivalent Booster 40yr & up 09/07/2021   Pneumococcal Conjugate-13 03/04/2016   Pneumococcal Polysaccharide-23 04/12/2017   Td 05/13/2008   Tdap 07/02/2020    TDAP status: Up to date  Flu Vaccine status: Up to date  Pneumococcal vaccine status: Up to date  Covid-19 vaccine status: Completed vaccines  Qualifies for Shingles Vaccine? Yes   Zostavax completed No   Shingrix Completed?: No.    Education has been provided regarding the importance of this vaccine. Patient has been advised to call insurance company to determine out of pocket expense if they have not yet received this vaccine. Advised may also receive vaccine at local pharmacy or Health Dept. Verbalized acceptance and understanding.  Screening Tests Health Maintenance  Topic Date Due   OPHTHALMOLOGY EXAM  Never done   Zoster Vaccines- Shingrix (1 of 2) Never done   FOOT EXAM  04/18/2020   COVID-19 Vaccine (6 - Moderna series) 01/07/2022   INFLUENZA VACCINE  07/13/2022   HEMOGLOBIN A1C  09/22/2022   URINE MICROALBUMIN  03/24/2023   TETANUS/TDAP  07/02/2030   Pneumonia Vaccine 81 Years old  Completed   Hepatitis C Screening  Completed   HPV VACCINES  Aged Out   COLONOSCOPY (Pts 45-443yr Insurance coverage will need to be confirmed)  Discontinued    Health Maintenance  Health Maintenance Due  Topic Date Due   OPHTHALMOLOGY EXAM  Never done   Zoster Vaccines- Shingrix (1 of 2) Never done   FOOT EXAM  04/18/2020   COVID-19 Vaccine (6 - Moderna series) 01/07/2022    Colorectal cancer screening: No longer required.   Lung Cancer Screening: (Low Dose CT Chest recommended if Age 79-80ears, 30 pack-year currently smoking OR have quit w/in 15years.) does not qualify.   Lung Cancer Screening Referral: no  Additional Screening:  Hepatitis C Screening: does qualify; Completed 04/11/2017  Vision Screening: Recommended annual ophthalmology exams for early detection of glaucoma and other disorders of the eye. Is the patient up to date with their annual eye exam?  Yes  Who is the provider or what is the name of the office in which the patient attends annual eye exams? GrahamLyles, MD. If pt is not established with a provider, would they like to be referred to a provider to establish care? No .   Dental Screening: Recommended annual dental exams for proper oral hygiene  Community Resource Referral / Chronic Care Management: CRR required this visit?  No   CCM required this visit?  No      Plan:     I have personally reviewed and noted the following in the patient's chart:   Medical and social history Use of alcohol, tobacco or illicit drugs  Current medications and supplements including opioid prescriptions. Patient is currently taking opioid prescriptions. Information provided to patient regarding non-opioid alternatives. Patient advised to discuss non-opioid treatment plan with their provider. Functional ability and status Nutritional status Physical activity Advanced directives List of other physicians Hospitalizations, surgeries, and ER visits in previous 12 months Vitals Screenings to include cognitive, depression, and falls Referrals and appointments  In  addition, I have reviewed and discussed with patient certain preventive protocols, quality metrics, and best practice recommendations. A written personalized care plan for preventive services as well as general preventive health recommendations were provided to patient.     Sheral Flow, LPN   08/19/7413   Nurse Notes:  Patient is cogitatively intact. There were no vitals filed for this visit. There is no height or weight on file to calculate BMI. Patient stated that he has no issues with gait or balance; does not use any assistive devices.

## 2022-05-20 NOTE — Patient Instructions (Signed)
Jeffery [name] , Thank you for taking time to come for your Medicare Wellness Visit. I appreciate your ongoing commitment to your health goals. Please review the following plan we discussed and let me know if I can assist you in the future.   Screening recommendations/referrals: Colonoscopy: Discontinued due to age Recommended yearly ophthalmology/optometry visit for glaucoma screening and checkup Recommended yearly dental visit for hygiene and checkup  Vaccinations: Influenza vaccine: 09/04/2021 Pneumococcal vaccine: 03/04/2016, 04/12/2017 Tdap vaccine: 07/02/2020; due every 10 years Shingles vaccine: never done   Covid-19: 09/12/2018, 01/25/2020, 03/03/2020, 05/26/2021, 09/07/2021  Advanced directives: Yes; Please bring a copy of your health care power of attorney and living will to the office at your convenience.  Conditions/risks identified: Yes  Next appointment: Please schedule your next Medicare Wellness Visit with your Nurse Health Advisor in 1 year by calling [phone].  Preventive Care 31 Years and Older, Male Preventive care refers to lifestyle choices and visits with your health care provider that can promote health and wellness. What does preventive care include? A yearly physical exam. This is also called an annual well check. Dental exams once or twice a year. Routine eye exams. Ask your health care provider how often you should have your eyes checked. Personal lifestyle choices, including: Daily care of your teeth and gums. Regular physical activity. Eating a healthy diet. Avoiding tobacco and drug use. Limiting alcohol use. Practicing safe sex. Taking low doses of aspirin every day. Taking vitamin and mineral supplements as recommended by your health care provider. What happens during an annual well check? The services and screenings done by your health care provider during your annual well check will depend on your age, overall health, lifestyle risk factors, and  family history of disease. Counseling  Your health care provider may ask you questions about your: Alcohol use. Tobacco use. Drug use. Emotional well-being. Home and relationship well-being. Sexual activity. Eating habits. History of falls. Memory and ability to understand (cognition). Work and work Statistician. Screening  You may have the following tests or measurements: Height, weight, and BMI. Blood pressure. Lipid and cholesterol levels. These may be checked every 5 years, or more frequently if you are over 42 years old. Skin check. Lung cancer screening. You may have this screening every year starting at age 56 if you have a 30-pack-year history of smoking and currently smoke or have quit within the past 15 years. Fecal occult blood test (FOBT) of the stool. You may have this test every year starting at age 79. Flexible sigmoidoscopy or colonoscopy. You may have a sigmoidoscopy every 5 years or a colonoscopy every 10 years starting at age 85. Prostate cancer screening. Recommendations will vary depending on your family history and other risks. Hepatitis C blood test. Hepatitis B blood test. Sexually transmitted disease (STD) testing. Diabetes screening. This is done by checking your blood sugar (glucose) after you have not eaten for a while (fasting). You may have this done every 1-3 years. Abdominal aortic aneurysm (AAA) screening. You may need this if you are a current or former smoker. Osteoporosis. You may be screened starting at age 28 if you are at high risk. Talk with your health care provider about your test results, treatment options, and if necessary, the need for more tests. Vaccines  Your health care provider may recommend certain vaccines, such as: Influenza vaccine. This is recommended every year. Tetanus, diphtheria, and acellular pertussis (Tdap, Td) vaccine. You may need a Td booster every 10 years. Zoster vaccine. You may  need this after age 22. Pneumococcal  13-valent conjugate (PCV13) vaccine. One dose is recommended after age 21. Pneumococcal polysaccharide (PPSV23) vaccine. One dose is recommended after age 31. Talk to your health care provider about which screenings and vaccines you need and how often you need them. This information is not intended to replace advice given to you by your health care provider. Make sure you discuss any questions you have with your health care provider. Document Released: 12/26/2015 Document Revised: 08/18/2016 Document Reviewed: 09/30/2015 Elsevier Interactive Patient Education  2017 [place] Prevention in the Home Falls can cause injuries. They can happen to people of all ages. There are many things you can do to make your home safe and to help prevent falls. What can I do on the outside of my home? Regularly fix the edges of walkways and driveways and fix any cracks. Remove anything that might make you trip as you walk through a door, such as a raised step or threshold. Trim any bushes or trees on the path to your home. Use bright outdoor lighting. Clear any walking paths of anything that might make someone trip, such as rocks or tools. Regularly check to see if handrails are loose or broken. Make sure that both sides of any steps have handrails. Any raised decks and porches should have guardrails on the edges. Have any leaves, snow, or ice cleared regularly. Use sand or salt on walking paths during winter. Clean up any spills in your garage right away. This includes oil or grease spills. What can I do in the bathroom? Use night lights. Install grab bars by the toilet and in the tub and shower. Do not use towel bars as grab bars. Use non-skid mats or decals in the tub or shower. If you need to sit down in the shower, use a plastic, non-slip stool. Keep the floor dry. Clean up any water that spills on the floor as soon as it happens. Remove soap buildup in the tub or shower regularly. Attach  bath mats securely with double-sided non-slip rug tape. Do not have throw rugs and other things on the floor that can make you trip. What can I do in the bedroom? Use night lights. Make sure that you have a light by your bed that is easy to reach. Do not use any sheets or blankets that are too big for your bed. They should not hang down onto the floor. Have a firm chair that has side arms. You can use this for support while you get dressed. Do not have throw rugs and other things on the floor that can make you trip. What can I do in the kitchen? Clean up any spills right away. Avoid walking on wet floors. Keep items that you use a lot in easy-to-reach places. If you need to reach something above you, use a strong step stool that has a grab bar. Keep electrical cords out of the way. Do not use floor polish or wax that makes floors slippery. If you must use wax, use non-skid floor wax. Do not have throw rugs and other things on the floor that can make you trip. What can I do with my stairs? Do not leave any items on the stairs. Make sure that there are handrails on both sides of the stairs and use them. Fix handrails that are broken or loose. Make sure that handrails are as long as the stairways. Check any carpeting to make sure that it is firmly attached  to the stairs. Fix any carpet that is loose or worn. Avoid having throw rugs at the top or bottom of the stairs. If you do have throw rugs, attach them to the floor with carpet tape. Make sure that you have a light switch at the top of the stairs and the bottom of the stairs. If you do not have them, ask someone to add them for you. What else can I do to help prevent falls? Wear shoes that: Do not have high heels. Have rubber bottoms. Are comfortable and fit you well. Are closed at the toe. Do not wear sandals. If you use a stepladder: Make sure that it is fully opened. Do not climb a closed stepladder. Make sure that both sides of the  stepladder are locked into place. Ask someone to hold it for you, if possible. Clearly mark and make sure that you can see: Any grab bars or handrails. First and last steps. Where the edge of each step is. Use tools that help you move around (mobility aids) if they are needed. These include: Canes. Walkers. Scooters. Crutches. Turn on the lights when you go into a dark area. Replace any light bulbs as soon as they burn out. Set up your furniture so you have a clear path. Avoid moving your furniture around. If any of your floors are uneven, fix them. If there are any pets around you, be aware of where they are. Review your medicines with your doctor. Some medicines can make you feel dizzy. This can increase your chance of falling. Ask your doctor what other things that you can do to help prevent falls. This information is not intended to replace advice given to you by your health care provider. Make sure you discuss any questions you have with your health care provider. Document Released: 09/25/2009 Document Revised: 05/06/2016 Document Reviewed: 01/03/2015 Elsevier Interactive Patient Education  2017 [employer].

## 2022-05-25 ENCOUNTER — Other Ambulatory Visit: Payer: Self-pay

## 2022-05-25 ENCOUNTER — Other Ambulatory Visit: Payer: Self-pay | Admitting: Family Medicine

## 2022-05-25 MED ORDER — CELECOXIB 100 MG PO CAPS: ORAL_CAPSULE | ORAL | 0 refills | Status: DC

## 2022-05-28 ENCOUNTER — Encounter: Payer: Self-pay | Admitting: Interventional Cardiology

## 2022-05-29 ENCOUNTER — Other Ambulatory Visit: Payer: Self-pay | Admitting: Internal Medicine

## 2022-05-31 ENCOUNTER — Other Ambulatory Visit: Payer: Self-pay | Admitting: Internal Medicine

## 2022-05-31 MED ORDER — HYDROCODONE-ACETAMINOPHEN 7.5-325 MG PO TABS: 1.0000 | ORAL_TABLET | Freq: Four times a day (QID) | ORAL | 0 refills | Status: DC | PRN

## 2022-05-31 NOTE — Telephone Encounter (Signed)
Check  registry last filled 04/30/2022.Marland KitchenJohny [name]

## 2022-06-03 ENCOUNTER — Encounter: Payer: Self-pay | Admitting: Interventional Cardiology

## 2022-06-04 ENCOUNTER — Other Ambulatory Visit: Payer: Self-pay | Admitting: Interventional Cardiology

## 2022-06-04 ENCOUNTER — Other Ambulatory Visit: Payer: Self-pay | Admitting: Internal Medicine

## 2022-06-09 ENCOUNTER — Encounter: Payer: Self-pay | Admitting: Interventional Cardiology

## 2022-06-09 DIAGNOSIS — I5042 Chronic combined systolic (congestive) and diastolic (congestive) heart failure: Secondary | ICD-10-CM

## 2022-06-10 DIAGNOSIS — D049 Carcinoma in situ of skin, unspecified: Secondary | ICD-10-CM | POA: Diagnosis not present

## 2022-06-10 DIAGNOSIS — C44519 Basal cell carcinoma of skin of other part of trunk: Secondary | ICD-10-CM | POA: Diagnosis not present

## 2022-06-10 MED ORDER — DAPAGLIFLOZIN PROPANEDIOL 10 MG PO TABS: 10.0000 mg | ORAL_TABLET | Freq: Every day | ORAL | 0 refills | Status: DC

## 2022-06-18 ENCOUNTER — Other Ambulatory Visit: Payer: Self-pay | Admitting: Family Medicine

## 2022-06-19 ENCOUNTER — Other Ambulatory Visit: Payer: Self-pay | Admitting: Internal Medicine

## 2022-06-26 ENCOUNTER — Other Ambulatory Visit: Payer: Self-pay | Admitting: Internal Medicine

## 2022-06-28 MED ORDER — HYDROCODONE-ACETAMINOPHEN 7.5-325 MG PO TABS: 1.0000 | ORAL_TABLET | Freq: Four times a day (QID) | ORAL | 0 refills | Status: DC | PRN

## 2022-07-01 ENCOUNTER — Other Ambulatory Visit: Payer: Self-pay | Admitting: Internal Medicine

## 2022-07-02 ENCOUNTER — Other Ambulatory Visit: Payer: Self-pay | Admitting: Interventional Cardiology

## 2022-07-05 ENCOUNTER — Ambulatory Visit: Payer: Medicare Other | Admitting: Interventional Cardiology

## 2022-07-05 ENCOUNTER — Other Ambulatory Visit: Payer: Self-pay | Admitting: Interventional Cardiology

## 2022-07-06 ENCOUNTER — Other Ambulatory Visit: Payer: Medicare Other

## 2022-07-06 DIAGNOSIS — I5042 Chronic combined systolic (congestive) and diastolic (congestive) heart failure: Secondary | ICD-10-CM | POA: Diagnosis not present

## 2022-07-06 LAB — BASIC METABOLIC PANEL
BUN/Creatinine Ratio: 14 (ref 10–24)
BUN: 16 mg/dL (ref 8–27)
CO2: 23 mmol/L (ref 20–29)
Calcium: 9.4 mg/dL (ref 8.6–10.2)
Chloride: 102 mmol/L (ref 96–106)
Creatinine, Ser: 1.18 mg/dL (ref 0.76–1.27)
Glucose: 127 mg/dL — ABNORMAL HIGH (ref 70–99)
Potassium: 4.9 mmol/L (ref 3.5–5.2)
Sodium: 138 mmol/L (ref 134–144)
eGFR: 63 mL/min/{1.73_m2} (ref >59)

## 2022-07-06 MED ORDER — DAPAGLIFLOZIN PROPANEDIOL 10 MG PO TABS: 10.0000 mg | ORAL_TABLET | Freq: Every day | ORAL | 2 refills | Status: DC

## 2022-07-08 DIAGNOSIS — D0439 Carcinoma in situ of skin of other parts of face: Secondary | ICD-10-CM | POA: Diagnosis not present

## 2022-07-14 ENCOUNTER — Other Ambulatory Visit: Payer: Self-pay | Admitting: Family Medicine

## 2022-07-29 ENCOUNTER — Other Ambulatory Visit: Payer: Self-pay | Admitting: Internal Medicine

## 2022-07-29 MED ORDER — HYDROCODONE-ACETAMINOPHEN 7.5-325 MG PO TABS: 1.0000 | ORAL_TABLET | Freq: Four times a day (QID) | ORAL | 0 refills | Status: DC | PRN

## 2022-07-29 NOTE — Telephone Encounter (Signed)
Check [place] registry last filled 06/28/2022. MD is out of the office pls advise.Marland KitchenJohny [name]

## 2022-07-30 ENCOUNTER — Ambulatory Visit (INDEPENDENT_AMBULATORY_CARE_PROVIDER_SITE_OTHER): Payer: Medicare Other

## 2022-07-30 DIAGNOSIS — I441 Atrioventricular block, second degree: Secondary | ICD-10-CM | POA: Diagnosis not present

## 2022-07-30 LAB — CUP PACEART REMOTE DEVICE CHECK
Battery Remaining Longevity: 43 mo
Battery Remaining Percentage: 50 %
Battery Voltage: 2.96 V
Brady Statistic AP VP Percent: 37 %
Brady Statistic AP VS Percent: 1 %
Brady Statistic AS VP Percent: 61 %
Brady Statistic AS VS Percent: 1 %
Brady Statistic RA Percent Paced: 37 %
Date Time Interrogation Session: 20230818020011
Implantable Lead Implant Date: 20190813
Implantable Lead Implant Date: 20190813
Implantable Lead Implant Date: 20190813
Implantable Lead Location: 753858
Implantable Lead Location: 753859
Implantable Lead Location: 753860
Implantable Pulse Generator Implant Date: 20190813
Lead Channel Impedance Value: 400 Ohm
Lead Channel Impedance Value: 450 Ohm
Lead Channel Impedance Value: 730 Ohm
Lead Channel Pacing Threshold Amplitude: 0.5 V
Lead Channel Pacing Threshold Amplitude: 0.75 V
Lead Channel Pacing Threshold Amplitude: 1.25 V
Lead Channel Pacing Threshold Pulse Width: 0.5 ms
Lead Channel Pacing Threshold Pulse Width: 0.5 ms
Lead Channel Pacing Threshold Pulse Width: 0.5 ms
Lead Channel Sensing Intrinsic Amplitude: 11.4 mV
Lead Channel Sensing Intrinsic Amplitude: 2.1 mV
Lead Channel Setting Pacing Amplitude: 1.75 V
Lead Channel Setting Pacing Amplitude: 2.25 V
Lead Channel Setting Pacing Amplitude: 2.5 V
Lead Channel Setting Pacing Pulse Width: 0.5 ms
Lead Channel Setting Pacing Pulse Width: 0.5 ms
Lead Channel Setting Sensing Sensitivity: 5 mV
Pulse Gen Model: 3562
Pulse Gen Serial Number: 9043864

## 2022-08-11 ENCOUNTER — Other Ambulatory Visit: Payer: Self-pay | Admitting: Family Medicine

## 2022-08-19 ENCOUNTER — Encounter: Payer: Self-pay | Admitting: Internal Medicine

## 2022-08-19 ENCOUNTER — Ambulatory Visit: Payer: Medicare Other | Attending: Internal Medicine | Admitting: Internal Medicine

## 2022-08-19 VITALS — BP 126/78 | HR 60 | Ht 69.5 in | Wt 179.2 lb

## 2022-08-19 DIAGNOSIS — Z950 Presence of cardiac pacemaker: Secondary | ICD-10-CM

## 2022-08-19 DIAGNOSIS — R55 Syncope and collapse: Secondary | ICD-10-CM | POA: Diagnosis not present

## 2022-08-19 DIAGNOSIS — I447 Left bundle-branch block, unspecified: Secondary | ICD-10-CM

## 2022-08-19 NOTE — Patient Instructions (Addendum)
Medication Instructions:  Your physician recommends that you continue on your current medications as directed. Please refer to the Current Medication list given to you today.  *If you need a refill on your cardiac medications before your next appointment, please call your pharmacy*  Lab Work: None ordered.  If you have labs (blood work) drawn today and your tests are completely normal, you will receive your results only by: [place] (if you have MyChart) OR A paper copy in the mail If you have any lab test that is abnormal or we need to change your treatment, we will call you to review the results.  Testing/Procedures: None ordered.  Follow-Up: At [facility], you and your health needs are our priority.  As part of our continuing mission to provide you with exceptional heart care, we have created designated Provider Care Teams.  These Care Teams include your primary Cardiologist (physician) and Advanced Practice Providers (APPs -  Physician Assistants and Nurse Practitioners) who all work together to provide you with the care you need, when you need it.  We recommend signing up for the patient portal called "MyChart".  Sign up information is provided on this After Visit Summary.  MyChart is used to connect with patients for Virtual Visits (Telemedicine).  Patients are able to view lab/test results, encounter notes, upcoming appointments, etc.  Non-urgent messages can be sent to your provider as well.   To learn more about what you can do with MyChart, go to [email or web address].    Your next appointment:   Follow up in 1 year with Dr. Cristopher Peru   The format for your next appointment:   In Person  Provider:   Cristopher Peru, MD{or one of the following Advanced Practice Providers on your designated Care Team:   Tommye Standard, [place] Legrand Como "Jonni Sanger" Chalmers Cater, [place]  Remote monitoring is used to monitor your Pacemaker from home. This monitoring reduces the number of office visits  required to check your device to one time per year. It allows [place] to keep an eye on the functioning of your device to ensure it is working properly. You are scheduled for a device check from home on 10/29/22. You may send your transmission at any time that day. If you have a wireless device, the transmission will be sent automatically. After your physician reviews your transmission, you will receive a postcard with your next transmission date.  Important Information About Sugar

## 2022-08-19 NOTE — Progress Notes (Signed)
HPI Mr. Clack returns today for followup. He is a pleasant 81 yo man with mild LV dysfunction, [place] syncope who underwent biv PPM insertion a year ago. He has done well in the interim. No recurrent syncope. No chest pain or sob. He is helping to care for his grandchildren. Allergies  Allergen Reactions   Contrast Media [Iodinated Contrast Media] Other (See Comments)    Redness and warm sensation, this reaction was noted on 02/27/13 during a Cardiac MRI per pt.  Pt sts he had erythema on his head, chest, and shoulders.  Pt had a pacemaker placed August 2019 and was pre-medicated for the dye and had no allergic reaction at that time. -Carissa Mozingo B.S. RT(R)(CT)   Iodine      Current Outpatient Medications  Medication Sig Dispense Refill   ACCU-CHEK AVIVA PLUS test strip USE AS INSTRUCTED TO TEST BLOOD SUGAR 6 TIMES DAILY. DX CODE: E11.59 500 strip 4   ACCU-CHEK SOFTCLIX LANCETS lancets Use to test blood sugar daily as directed. 100 each 3   albuterol (PROVENTIL) (2.5 MG/3ML) 0.083% nebulizer solution TAKE 3 MLS BY NEBULIZATION EVERY 6 (SIX) HOURS AS NEEDED FOR WHEEZING OR SHORTNESS OF BREATH. 150 mL 5   albuterol (VENTOLIN HFA) 108 (90 Base) MCG/ACT inhaler TAKE 2 PUFFS BY MOUTH EVERY 6 HOURS AS NEEDED FOR WHEEZE OR SHORTNESS OF BREATH 6.7 each 11   aspirin 81 MG tablet Take 81 mg by mouth at bedtime.     celecoxib (CELEBREX) 100 MG capsule TAKE 1 CAPSULE BY MOUTH EVERY DAY 30 capsule 0   Continuous Blood Gluc Receiver (FREESTYLE LIBRE READER) DEVI 1 Device by Does not apply route every 14 (fourteen) days. Freestyle libre 3 reader 1 each 0   Continuous Blood Gluc Sensor (FREESTYLE LIBRE 14 DAY SENSOR) MISC APPLY 1 SENSOR EVERY 14 DAYS 2 each 2   dapagliflozin propanediol (FARXIGA) 10 MG TABS tablet Take 1 tablet (10 mg total) by mouth daily before breakfast. 90 tablet 2   fexofenadine (ALLEGRA) 180 MG tablet Take 180 mg by mouth daily as needed for allergies.       gabapentin (NEURONTIN) 100 MG capsule TAKE 2 CAPSULES BY MOUTH 3 TIMES DAILY. 180 capsule 3   HYDROcodone-acetaminophen (NORCO) 7.5-325 MG tablet Take 1 tablet by mouth every 6 (six) hours as needed for severe pain (for chronic joint pain, neck pain). 90 tablet 0   LORazepam (ATIVAN) 1 MG tablet TAKE 1/2 TO 1 TABLET BY MOUTH NIGHTLY AS NEEDED 30 tablet 5   metFORMIN (GLUCOPHAGE) 500 MG tablet TAKE 1 TABLET BY MOUTH 2 TIMES DAILY WITH A MEAL. 180 tablet 1   montelukast (SINGULAIR) 10 MG tablet TAKE 1 TABLET BY MOUTH AT  BEDTIME 90 tablet 3   Multiple Vitamin (MULTIVITAMIN WITH MINERALS) TABS tablet Take 1 tablet by mouth daily.     nebivolol (BYSTOLIC) 5 MG tablet TAKE 1 TABLET BY MOUTH  DAILY 90 tablet 3   nitroGLYCERIN (NITROSTAT) 0.4 MG SL tablet PLACE 1 TABLET (0.4 MG TOTAL) UNDER THE TONGUE EVERY 5 (FIVE) MINUTES X 3 DOSES AS NEEDED FOR CHEST PAIN. 75 tablet 1   Respiratory Therapy Supplies (FLUTTER) DEVI Twice a day and prn as needed, may increase if feeling worse 1 each 0   rosuvastatin (CRESTOR) 10 MG tablet TAKE 1 TABLET ON MONDAY, [place]. 45 tablet 3   SYMBICORT 160-4.5 MCG/ACT inhaler USE 1 TO 2 INHALATIONS BY  MOUTH EVERY 12 HOURS AS  NEEDED 30.6  g 3   vardenafil (LEVITRA) 20 MG tablet TAKE 1 TABLET BY MOUTH AS DIRECTED. CAN NOT BE TAKEN WITH NITROGLYCERIN. NOT COVERED BY INSURANCE 6 tablet 2   zinc gluconate 50 MG tablet Take 50 mg by mouth daily.     famotidine (PEPCID) 20 MG tablet Take 1 tablet (20 mg total) by mouth daily. 30 tablet 0   No current facility-administered medications for this visit.     Past Medical History:  Diagnosis Date   Arthritis    "thumbs" (07/25/2018)   Asthma    CHF (congestive heart failure) (HCC)    Chronic bronchitis (HCC)    Environmental allergies    GERD (gastroesophageal reflux disease)    Headache    "seasonal; w/environmental allergies" (07/25/2018)   History of blood transfusion    "when I had laminectomy" (07/25/2018)   HTN  (hypertension)    Hyperlipidemia    LBBB (left bundle branch block) 1999   Metatarsal bone fracture 03/07/2018   Myocardial infarction [facility])    "was told I've had an old MI; probably in the 1990s" (07/25/2018)   Pneumonia    "as a child, age 41; viral pneumonia 3 times in the last 10 years" (07/25/2018)   Presence of permanent cardiac pacemaker 07/25/2018   Seasonal allergies    Sleep apnea    "wife says I do" (07/25/2018)   Type II diabetes mellitus ([place])     ROS:   All systems reviewed and negative except as noted in the HPI.   Past Surgical History:  Procedure Laterality Date   BACK SURGERY     BIV PACEMAKER INSERTION CRT-P  07/25/2018   BIV PACEMAKER INSERTION CRT-P N/A 07/25/2018   Procedure: BIV PACEMAKER INSERTION CRT-P;  Surgeon: Evans Lance, MD;  Location: [facility];  Service: Cardiovascular;  Laterality: N/A;   CARDIAC CATHETERIZATION  2003   Dr Pernell Dupre; 85 % R circumflex obstruction   CARPAL TUNNEL RELEASE Left 10/11/2014   Procedure: LEFT CARPAL TUNNEL RELEASE;  Surgeon: Roseanne Kaufman, MD;  Location: [facility];  Service: Orthopedics;  Laterality: Left;   COLONOSCOPY W/ BIOPSIES AND POLYPECTOMY  2018   INGUINAL HERNIA REPAIR Right 1948   INGUINAL HERNIA REPAIR Left 1988   LUMBAR LAMINECTOMY  1984   MINOR CARPAL TUNNEL Right 11/22/2014   Procedure: RIGHT LIMITED OPEN CARPAL TUNNEL RELEASE;  Surgeon: Roseanne Kaufman, MD;  Location: [facility];  Service: Orthopedics;  Laterality: Right;   TONSILLECTOMY  1958     Family History  Problem Relation Age of Onset   Heart attack Father        44s   Colon polyps Father    Heart failure Mother        90s   Subarachnoid hemorrhage Mother 11   Hypertension Mother    Subarachnoid hemorrhage Paternal Grandfather 69   Diabetes Maternal Aunt    Colon cancer Neg Hx      Social History   Socioeconomic History   Marital status: Married    Spouse name: Not on file   Number of  children: 3   Years of education: Not on file   Highest education level: Professional school degree (e.g., MD, DDS, DVM, JD)  Occupational History   Occupation: retired Pension scheme manager: [employer]  Tobacco Use   Smoking status: Former    Packs/day: 2.00    Years: 2.00    Total pack years: 4.00    Types: Cigarettes  Quit date: 12/14/1963    Years since quitting: 58.7   Smokeless tobacco: Never  Vaping Use   Vaping Use: Never used  Substance and Sexual Activity   Alcohol use: Not Currently    Alcohol/week: 2.0 standard drinks of alcohol    Types: 2 Glasses of wine per week    Comment: occasional   Drug use: Never   Sexual activity: Yes  Other Topics Concern   Not on file  Social History Narrative   Lives with wife in a 2 story home.  Has 3 daughters.  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     Social Determinants of Health   Financial Resource Strain: Low Risk  (05/20/2022)   Overall Financial Resource Strain (CARDIA)    Difficulty of Paying Living Expenses: Not hard at all  Food Insecurity: No Food Insecurity (05/20/2022)   Hunger Vital Sign    Worried About Running Out of Food in the Last Year: Never true    Ran Out of Food in the Last Year: Never true  Transportation Needs: No Transportation Needs (05/20/2022)   PRAPARE - Hydrologist (Medical): No    Lack of Transportation (Non-Medical): No  Physical Activity: Unknown (05/20/2022)   Exercise Vital Sign    Days of Exercise per Week: Not on file    Minutes of Exercise per Session: 30 min  Stress: No Stress Concern Present (05/20/2022)   [place]    Feeling of Stress : Not at all  Social Connections: [place] (05/20/2022)   Social Connection and Isolation Panel [NHANES]    Frequency of Communication with Friends and Family: More than three times a week    Frequency of Social Gatherings with Friends and Family:  More than three times a week    Attends Religious Services: More than 4 times per year    Active Member of [employer] or Organizations: Yes    Attends Music therapist: More than 4 times per year    Marital Status: Married  Human resources officer Violence: Not At Risk (05/20/2022)   Humiliation, Afraid, Rape, and Kick questionnaire    Fear of Current or Ex-Partner: No    Emotionally Abused: No    Physically Abused: No    Sexually Abused: No     BP 126/78   Pulse 60   Ht 5' 9.5" (1.765 m)   Wt 179 lb 3.2 oz (81.3 kg)   SpO2 98%   BMI 26.08 kg/m   Physical Exam:  Well appearing NAD HEENT: Unremarkable Neck:  No JVD, no thyromegally Lymphatics:  No adenopathy Back:  No CVA tenderness Lungs:  Clear HEART:  Regular rate rhythm, no murmurs, no rubs, no clicks Abd:  soft, positive bowel sounds, no organomegally, no rebound, no guarding Ext:  2 plus pulses, no edema, no cyanosis, no clubbing Skin:  No rashes no nodules Neuro:  CN II through XII intact, motor grossly intact  EKG - nsr with biv pacing  DEVICE  Normal device function.  See PaceArt for details.   Assess/Plan:  1. Syncope - he has not had any since his PPM was inserted.  2. Chronic systolic and diastolic heart failure - his symptoms are class 1. He will continue his current meds. 3. PPM - his [employer] biv PM is working normally.   4. HTN - his  bp is well controlled. We will follow.  Mikle Bosworth.D.

## 2022-08-20 LAB — CUP PACEART INCLINIC DEVICE CHECK
Battery Remaining Longevity: 48 mo
Brady Statistic RA Percent Paced: 37 %
Brady Statistic RV Percent Paced: 99 %
Date Time Interrogation Session: 20230907104627
Implantable Lead Implant Date: 20190813
Implantable Lead Implant Date: 20190813
Implantable Lead Implant Date: 20190813
Implantable Lead Location: 753858
Implantable Lead Location: 753859
Implantable Lead Location: 753860
Implantable Pulse Generator Implant Date: 20190813
Lead Channel Pacing Threshold Amplitude: 0.625 V
Lead Channel Pacing Threshold Amplitude: 0.75 V
Lead Channel Pacing Threshold Amplitude: 1 V
Lead Channel Pacing Threshold Pulse Width: 0.5 ms
Lead Channel Pacing Threshold Pulse Width: 0.5 ms
Lead Channel Pacing Threshold Pulse Width: 0.5 ms
Lead Channel Sensing Intrinsic Amplitude: 3.6 mV
Pulse Gen Model: 3562
Pulse Gen Serial Number: 9043864

## 2022-08-25 NOTE — Progress Notes (Signed)
Remote pacemaker transmission.   

## 2022-08-27 ENCOUNTER — Other Ambulatory Visit: Payer: Self-pay | Admitting: Internal Medicine

## 2022-08-27 MED ORDER — HYDROCODONE-ACETAMINOPHEN 7.5-325 MG PO TABS: 1.0000 | ORAL_TABLET | Freq: Four times a day (QID) | ORAL | 0 refills | Status: DC | PRN

## 2022-09-07 ENCOUNTER — Telehealth: Payer: Medicare Other

## 2022-09-16 ENCOUNTER — Other Ambulatory Visit: Payer: Self-pay | Admitting: Internal Medicine

## 2022-09-16 MED ORDER — LORAZEPAM 1 MG PO TABS: ORAL_TABLET | ORAL | 1 refills | Status: DC

## 2022-09-20 ENCOUNTER — Other Ambulatory Visit: Payer: Self-pay | Admitting: Internal Medicine

## 2022-09-25 ENCOUNTER — Other Ambulatory Visit: Payer: Self-pay | Admitting: Internal Medicine

## 2022-09-27 MED ORDER — HYDROCODONE-ACETAMINOPHEN 7.5-325 MG PO TABS: 1.0000 | ORAL_TABLET | Freq: Four times a day (QID) | ORAL | 0 refills | Status: DC | PRN

## 2022-10-09 ENCOUNTER — Other Ambulatory Visit: Payer: Self-pay | Admitting: Internal Medicine

## 2022-10-22 ENCOUNTER — Other Ambulatory Visit: Payer: Self-pay | Admitting: Internal Medicine

## 2022-10-22 MED ORDER — HYDROCODONE-ACETAMINOPHEN 7.5-325 MG PO TABS: 1.0000 | ORAL_TABLET | Freq: Four times a day (QID) | ORAL | 0 refills | Status: DC | PRN

## 2022-10-29 ENCOUNTER — Encounter: Payer: Self-pay | Admitting: Internal Medicine

## 2022-10-29 ENCOUNTER — Ambulatory Visit (INDEPENDENT_AMBULATORY_CARE_PROVIDER_SITE_OTHER): Payer: Medicare Other

## 2022-10-29 ENCOUNTER — Other Ambulatory Visit: Payer: Self-pay

## 2022-10-29 DIAGNOSIS — R55 Syncope and collapse: Secondary | ICD-10-CM

## 2022-10-29 LAB — CUP PACEART REMOTE DEVICE CHECK
Battery Remaining Longevity: 42 mo
Battery Remaining Percentage: 47 %
Battery Voltage: 2.96 V
Brady Statistic AP VP Percent: 34 %
Brady Statistic AP VS Percent: 1 %
Brady Statistic AS VP Percent: 65 %
Brady Statistic AS VS Percent: 1 %
Brady Statistic RA Percent Paced: 34 %
Date Time Interrogation Session: 20231117020010
Implantable Lead Connection Status: 753985
Implantable Lead Connection Status: 753985
Implantable Lead Connection Status: 753985
Implantable Lead Implant Date: 20190813
Implantable Lead Implant Date: 20190813
Implantable Lead Implant Date: 20190813
Implantable Lead Location: 753858
Implantable Lead Location: 753859
Implantable Lead Location: 753860
Implantable Pulse Generator Implant Date: 20190813
Lead Channel Impedance Value: 430 Ohm
Lead Channel Impedance Value: 460 Ohm
Lead Channel Impedance Value: 690 Ohm
Lead Channel Pacing Threshold Amplitude: 0.5 V
Lead Channel Pacing Threshold Amplitude: 0.75 V
Lead Channel Pacing Threshold Amplitude: 1 V
Lead Channel Pacing Threshold Pulse Width: 0.5 ms
Lead Channel Pacing Threshold Pulse Width: 0.5 ms
Lead Channel Pacing Threshold Pulse Width: 0.5 ms
Lead Channel Sensing Intrinsic Amplitude: 1.7 mV
Lead Channel Sensing Intrinsic Amplitude: 12 mV
Lead Channel Setting Pacing Amplitude: 1.5 V
Lead Channel Setting Pacing Amplitude: 2 V
Lead Channel Setting Pacing Amplitude: 2.5 V
Lead Channel Setting Pacing Pulse Width: 0.5 ms
Lead Channel Setting Pacing Pulse Width: 0.5 ms
Lead Channel Setting Sensing Sensitivity: 5 mV
Pulse Gen Model: 3562
Pulse Gen Serial Number: 9043864

## 2022-10-29 MED ORDER — FREESTYLE LIBRE 14 DAY SENSOR MISC: 2 refills | Status: DC

## 2022-11-11 DIAGNOSIS — R6889 Other general symptoms and signs: Secondary | ICD-10-CM | POA: Diagnosis not present

## 2022-11-15 ENCOUNTER — Other Ambulatory Visit: Payer: Self-pay

## 2022-11-15 MED ORDER — CELECOXIB 100 MG PO CAPS: 100.0000 mg | ORAL_CAPSULE | Freq: Every day | ORAL | 0 refills | Status: DC

## 2022-11-17 NOTE — Progress Notes (Signed)
Remote pacemaker transmission.   

## 2022-11-18 ENCOUNTER — Encounter: Payer: Self-pay | Admitting: Internal Medicine

## 2022-11-24 ENCOUNTER — Encounter: Payer: Self-pay | Admitting: Podiatry

## 2022-11-24 ENCOUNTER — Ambulatory Visit (INDEPENDENT_AMBULATORY_CARE_PROVIDER_SITE_OTHER): Payer: Medicare Other | Admitting: Podiatry

## 2022-11-24 VITALS — BP 151/86

## 2022-11-24 DIAGNOSIS — M7752 Other enthesopathy of left foot: Secondary | ICD-10-CM

## 2022-11-24 DIAGNOSIS — L84 Corns and callosities: Secondary | ICD-10-CM | POA: Diagnosis not present

## 2022-11-24 MED ORDER — TRIAMCINOLONE ACETONIDE 10 MG/ML IJ SUSP
10.0000 mg | Freq: Once | INTRAMUSCULAR | Status: AC
  Administered 2022-11-24: 10 mg

## 2022-11-24 NOTE — Progress Notes (Signed)
Subjective:   Patient ID: Jeffery [name], male   DOB: 81 y.o.   MRN: [id number]   HPI Patient presents with pain around the base of the fifth metatarsal left with inflammation and also presents with 3 different lesions plantar left painful   ROS      Objective:  Physical Exam  Inflammatory capsulitis fifth MPJ base left painful when pressed and lesion formation x 3 plantar left painful when pressed     Assessment:  Chronic keratotic lesion left with pain along with inflammatory tendinitis capsulitis     Plan:  H&P reviewed condition sterile prep injected the fifth MPJ base insertion of the tendon and into the plantar capsule 3 mg dexamethasone Kenalog 5 mg Xylocaine after explaining chances for risk with this.  Patient had debridement of 3 separate lesions no iatrogenic bleeding reappoint routine care

## 2022-11-26 ENCOUNTER — Encounter: Payer: Self-pay | Admitting: Internal Medicine

## 2022-11-26 ENCOUNTER — Other Ambulatory Visit: Payer: Self-pay | Admitting: Internal Medicine

## 2022-11-26 MED ORDER — HYDROCODONE-ACETAMINOPHEN 7.5-325 MG PO TABS: 1.0000 | ORAL_TABLET | Freq: Four times a day (QID) | ORAL | 0 refills | Status: DC | PRN

## 2022-11-28 ENCOUNTER — Other Ambulatory Visit: Payer: Self-pay | Admitting: Internal Medicine

## 2022-11-29 ENCOUNTER — Encounter: Payer: Self-pay | Admitting: Interventional Cardiology

## 2022-11-29 NOTE — Telephone Encounter (Signed)
Can you provide this pt with another recommendation for cardiology as Dr. Burt Knack is not taking any new patients. Thanks

## 2022-11-29 NOTE — Progress Notes (Signed)
[facility] [address] [place] [address] Phone: [phone] Subjective:    I'm seeing this patient by the request  of:  Pincus Sanes, MD  CC: Neck pain follow-up  [id number]  06/04/2021 Significant arthritic changes of the neck.  From imaging patient does have significant spinal stenosis noted at the C1-C2 area.  Has talked to neurosurgery and they did discuss the possibility of fusion at this level but there are risks of course what this the patient would like to avoid.  Patient was having difficulty with daily activities as well as with sleep.  We discussed once again about the potential for Cymbalta but patient is still significantly symptomatic.  CT prolongation and will look into it.  Refill patient's Celebrex.  Increase gabapentin to 200 mg 3 times daily.  We have tried muscle relaxers in the past as well.  We discussed the possibility of epidurals but patient would be a very difficult injection.  Discussed the potential for repeating formal physical therapy for more of the other modalities but patient is concerned with this as well as massage because sometimes it seems to exacerbate it.  Patient will try these different changes and follow-up with me again in 6 weeks to discuss further.  Total time with patient reviewing imaging and discussing with him 36 minutes  Update 12/02/2022 Jeffery [name] is a 81 y.o. male coming in with complaint of cervical spine pain. Patient states that he can move his neck better ROM. States to say if it is better "is an understatement."  States that he has been able to make some improvement in range of motion even.  States that the pain is much more manageable.  Given chronic pain medications from primary care provider.  Feels like he is in a good place overall.      Past Medical History:  Diagnosis Date   Arthritis    "thumbs" (07/25/2018)   Asthma    CHF (congestive heart failure) (HCC)    Chronic  bronchitis (HCC)    Environmental allergies    GERD (gastroesophageal reflux disease)    Headache    "seasonal; w/environmental allergies" (07/25/2018)   History of blood transfusion    "when I had laminectomy" (07/25/2018)   HTN (hypertension)    Hyperlipidemia    LBBB (left bundle branch block) 1999   Metatarsal bone fracture 03/07/2018   Myocardial infarction [facility])    "was told I've had an old MI; probably in the 59s" (07/25/2018)   Pneumonia    "as a child, age 65; viral pneumonia 3 times in the last 10 years" (07/25/2018)   Presence of permanent cardiac pacemaker 07/25/2018   Seasonal allergies    Sleep apnea    "wife says I do" (07/25/2018)   Type II diabetes mellitus (HCC)    Past Surgical History:  Procedure Laterality Date   BACK SURGERY     BIV PACEMAKER INSERTION CRT-P  07/25/2018   BIV PACEMAKER INSERTION CRT-P N/A 07/25/2018   Procedure: BIV PACEMAKER INSERTION CRT-P;  Surgeon: Marinus Maw, MD;  Location: [facility];  Service: Cardiovascular;  Laterality: N/A;   CARDIAC CATHETERIZATION  2003   Dr Garnette Scheuermann; 85 % R circumflex obstruction   CARPAL TUNNEL RELEASE Left 10/11/2014   Procedure: LEFT CARPAL TUNNEL RELEASE;  Surgeon: Dominica Severin, MD;  Location: [facility];  Service: Orthopedics;  Laterality: Left;   COLONOSCOPY W/ BIOPSIES AND POLYPECTOMY  2018   INGUINAL  HERNIA REPAIR Right 1948   INGUINAL HERNIA REPAIR Left 1988   LUMBAR LAMINECTOMY  1984   MINOR CARPAL TUNNEL Right 11/22/2014   Procedure: RIGHT LIMITED OPEN CARPAL TUNNEL RELEASE;  Surgeon: Dominica Severin, MD;  Location: [facility];  Service: Orthopedics;  Laterality: Right;   TONSILLECTOMY  1958   Social History   Socioeconomic History   Marital status: Married    Spouse name: Not on file   Number of children: 3   Years of education: Not on file   Highest education level: Professional school degree (e.g., MD, DDS, DVM, JD)  Occupational History    Occupation: retired Scientist, forensic: [name]  Tobacco Use   Smoking status: Former    Packs/day: 2.00    Years: 2.00    Total pack years: 4.00    Types: Cigarettes    Quit date: 12/14/1963    Years since quitting: 59.0   Smokeless tobacco: Never  Vaping Use   Vaping Use: Never used  Substance and Sexual Activity   Alcohol use: Not Currently    Alcohol/week: 2.0 standard drinks of alcohol    Types: 2 Glasses of wine per week    Comment: occasional   Drug use: Never   Sexual activity: Yes  Other Topics Concern   Not on file  Social History Narrative   Lives with wife in a 2 story home.  Has 3 daughters.  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     Social Determinants of Health   Financial Resource Strain: Low Risk  (05/20/2022)   Overall Financial Resource Strain (CARDIA)    Difficulty of Paying Living Expenses: Not hard at all  Food Insecurity: No Food Insecurity (05/20/2022)   Hunger Vital Sign    Worried About Running Out of Food in the Last Year: Never true    Ran Out of Food in the Last Year: Never true  Transportation Needs: No Transportation Needs (05/20/2022)   PRAPARE - Administrator, Civil Service (Medical): No    Lack of Transportation (Non-Medical): No  Physical Activity: Unknown (05/20/2022)   Exercise Vital Sign    Days of Exercise per Week: Not on file    Minutes of Exercise per Session: 30 min  Stress: No Stress Concern Present (05/20/2022)   [employer] of Occupational Health - Occupational Stress Questionnaire    Feeling of Stress : Not at all  Social Connections: Socially Integrated (05/20/2022)   Social Connection and Isolation Panel [NHANES]    Frequency of Communication with Friends and Family: More than three times a week    Frequency of Social Gatherings with Friends and Family: More than three times a week    Attends Religious Services: More than 4 times per year    Active Member of [employer] or Organizations: Yes    Attends  Engineer, structural: More than 4 times per year    Marital Status: Married   Allergies  Allergen Reactions   Contrast Media [Iodinated Contrast Media] Other (See Comments)    Redness and warm sensation, this reaction was noted on 02/27/13 during a Cardiac MRI per pt.  Pt sts he had erythema on his head, chest, and shoulders.  Pt had a pacemaker placed August 2019 and was pre-medicated for the dye and had no allergic reaction at that time. -Carissa Mozingo B.S. RT(R)(CT)   Iodine    Family History  Problem Relation Age of Onset   Heart attack Father  58s   Colon polyps Father    Heart failure Mother        90s   Subarachnoid hemorrhage Mother 71   Hypertension Mother    Subarachnoid hemorrhage Paternal Grandfather 42   Diabetes Maternal Aunt    Colon cancer Neg Hx     Current Outpatient Medications (Endocrine & Metabolic):    dapagliflozin propanediol (FARXIGA) 10 MG TABS tablet, Take 1 tablet (10 mg total) by mouth daily before breakfast.   metFORMIN (GLUCOPHAGE) 500 MG tablet, TAKE 1 TABLET BY MOUTH TWICE A DAY WITH FOOD  Current Outpatient Medications (Cardiovascular):    nebivolol (BYSTOLIC) 5 MG tablet, TAKE 1 TABLET BY MOUTH  DAILY   nitroGLYCERIN (NITROSTAT) 0.4 MG SL tablet, PLACE 1 TABLET (0.4 MG TOTAL) UNDER THE TONGUE EVERY 5 (FIVE) MINUTES X 3 DOSES AS NEEDED FOR CHEST PAIN.   rosuvastatin (CRESTOR) 10 MG tablet, TAKE 1 TABLET ON MONDAY, WEDNESDAY & FRIDAY.   vardenafil (LEVITRA) 20 MG tablet, TAKE 1 TABLET BY MOUTH AS DIRECTED. CAN NOT BE TAKEN WITH NITROGLYCERIN. NOT COVERED BY INSURANCE  Current Outpatient Medications (Respiratory):    albuterol (PROVENTIL) (2.5 MG/3ML) 0.083% nebulizer solution, TAKE 3 MLS BY NEBULIZATION EVERY 6 (SIX) HOURS AS NEEDED FOR WHEEZING OR SHORTNESS OF BREATH.   albuterol (VENTOLIN HFA) 108 (90 Base) MCG/ACT inhaler, TAKE 2 PUFFS BY MOUTH EVERY 6 HOURS AS NEEDED FOR WHEEZE OR SHORTNESS OF BREATH   fexofenadine (ALLEGRA) 180  MG tablet, Take 180 mg by mouth daily as needed for allergies.    montelukast (SINGULAIR) 10 MG tablet, TAKE 1 TABLET BY MOUTH AT  BEDTIME   SYMBICORT 160-4.5 MCG/ACT inhaler, USE 1 TO 2 INHALATIONS BY  MOUTH EVERY 12 HOURS AS  NEEDED  Current Outpatient Medications (Analgesics):    aspirin 81 MG tablet, Take 81 mg by mouth at bedtime.   HYDROcodone-acetaminophen (NORCO) 7.5-325 MG tablet, Take 1 tablet by mouth every 6 (six) hours as needed for severe pain (for chronic joint pain, neck pain).   celecoxib (CELEBREX) 100 MG capsule, Take 1 capsule (100 mg total) by mouth 2 (two) times daily.   Current Outpatient Medications (Other):    ACCU-CHEK AVIVA PLUS test strip, USE AS INSTRUCTED TO TEST BLOOD SUGAR 6 TIMES DAILY. DX CODE: E11.59   ACCU-CHEK SOFTCLIX LANCETS lancets, Use to test blood sugar daily as directed.   Continuous Blood Gluc Receiver (FREESTYLE LIBRE READER) DEVI, 1 Device by Does not apply route every 14 (fourteen) days. Freestyle libre 3 reader   Continuous Blood Gluc Sensor (FREESTYLE LIBRE 14 DAY SENSOR) MISC, APPLY 1 SENSOR EVERY 14 DAYS   LORazepam (ATIVAN) 1 MG tablet, TAKE 1/2 TO 1 TABLET BY MOUTH NIGHTLY AS NEEDED   Multiple Vitamin (MULTIVITAMIN WITH MINERALS) TABS tablet, Take 1 tablet by mouth daily.   Respiratory Therapy Supplies (FLUTTER) DEVI, Twice a day and prn as needed, may increase if feeling worse   zinc gluconate 50 MG tablet, Take 50 mg by mouth daily.   famotidine (PEPCID) 20 MG tablet, Take 1 tablet (20 mg total) by mouth daily.   gabapentin (NEURONTIN) 100 MG capsule, TAKE 2 CAPSULES BY MOUTH 3 TIMES DAILY.     Review of Systems:  No headache, visual changes, nausea, vomiting, diarrhea, constipation, dizziness, abdominal pain, skin rash, fevers, chills, night sweats, weight loss, swollen lymph nodes, body aches, joint swelling, chest pain, shortness of breath, mood changes. POSITIVE muscle aches  Objective  Blood pressure 122/80, pulse 64, height 5'  9.5" (1.765 m),  weight 180 lb (81.6 kg), SpO2 99 %.   General: No apparent distress alert and oriented x3 mood and affect normal, dressed appropriately.  HEENT: Pupils equal, extraocular movements intact  Respiratory: Patient's speak in full sentences and does not appear short of breath  Cardiovascular: No lower extremity edema, non tender, no erythema  Neck exam does have limited range of motion in all planes.  Good grip strength noted of the hands bilaterally.    Impression and Recommendations:    The above documentation has been reviewed and is accurate and complete Judi Saa, DO

## 2022-12-02 ENCOUNTER — Ambulatory Visit (INDEPENDENT_AMBULATORY_CARE_PROVIDER_SITE_OTHER): Payer: Medicare Other | Admitting: Family Medicine

## 2022-12-02 DIAGNOSIS — M5030 Other cervical disc degeneration, unspecified cervical region: Secondary | ICD-10-CM

## 2022-12-02 MED ORDER — CELECOXIB 100 MG PO CAPS: 100.0000 mg | ORAL_CAPSULE | Freq: Two times a day (BID) | ORAL | 3 refills | Status: DC

## 2022-12-02 MED ORDER — GABAPENTIN 100 MG PO CAPS: ORAL_CAPSULE | ORAL | 3 refills | Status: DC

## 2022-12-02 NOTE — Patient Instructions (Addendum)
Good to see you  Celebrex twice a day  Gabapentin refilled  Wear gloves at night Heat instead of ice  Happy holidays See me when you need me

## 2022-12-02 NOTE — Assessment & Plan Note (Signed)
Patient does have a degenerative disc disease that is fairly severe overall.  Patient is doing relatively well with the medications at the moment.  Given Celebrex to potentially increase if he would like to up to twice a day.  Patient also still has the gabapentin and likes to use that on the intermittent aspect.  Follow-up with me again in 6 to 8 weeks.

## 2022-12-12 ENCOUNTER — Other Ambulatory Visit: Payer: Self-pay | Admitting: Interventional Cardiology

## 2022-12-12 ENCOUNTER — Other Ambulatory Visit: Payer: Self-pay | Admitting: Family Medicine

## 2022-12-25 ENCOUNTER — Other Ambulatory Visit: Payer: Self-pay | Admitting: Internal Medicine

## 2022-12-27 DIAGNOSIS — H25013 Cortical age-related cataract, bilateral: Secondary | ICD-10-CM | POA: Diagnosis not present

## 2022-12-27 DIAGNOSIS — H2513 Age-related nuclear cataract, bilateral: Secondary | ICD-10-CM | POA: Diagnosis not present

## 2022-12-27 DIAGNOSIS — E119 Type 2 diabetes mellitus without complications: Secondary | ICD-10-CM | POA: Diagnosis not present

## 2022-12-27 DIAGNOSIS — H524 Presbyopia: Secondary | ICD-10-CM | POA: Diagnosis not present

## 2022-12-27 LAB — HM DIABETES EYE EXAM

## 2022-12-27 MED ORDER — HYDROCODONE-ACETAMINOPHEN 7.5-325 MG PO TABS: 1.0000 | ORAL_TABLET | Freq: Four times a day (QID) | ORAL | 0 refills | Status: DC | PRN

## 2022-12-28 ENCOUNTER — Encounter: Payer: Self-pay | Admitting: Internal Medicine

## 2022-12-28 NOTE — Progress Notes (Signed)
Outside notes received. Information abstracted. Notes sent to scan. 

## 2022-12-30 ENCOUNTER — Encounter: Payer: Self-pay | Admitting: Internal Medicine

## 2022-12-30 NOTE — Progress Notes (Signed)
Subjective:    Patient ID: Jeffery [name], male    DOB: January 04, 1941, 82 y.o.   MRN: [id number]     HPI Jeffery [name] is here for follow up of his chronic medical problems, including DM, htn, chronic pain management, combined HF, hld, sleep difficulties, asthma  Asthma overall controlled  Sugars in am 105-110    Medications and allergies reviewed with patient and updated if appropriate.  Current Outpatient Medications on File Prior to Visit  Medication Sig Dispense Refill   ACCU-CHEK AVIVA PLUS test strip USE AS INSTRUCTED TO TEST BLOOD SUGAR 6 TIMES DAILY. DX CODE: E11.59 500 strip 4   ACCU-CHEK SOFTCLIX LANCETS lancets Use to test blood sugar daily as directed. 100 each 3   albuterol (PROVENTIL) (2.5 MG/3ML) 0.083% nebulizer solution TAKE 3 MLS BY NEBULIZATION EVERY 6 (SIX) HOURS AS NEEDED FOR WHEEZING OR SHORTNESS OF BREATH. 150 mL 5   albuterol (VENTOLIN HFA) 108 (90 Base) MCG/ACT inhaler TAKE 2 PUFFS BY MOUTH EVERY 6 HOURS AS NEEDED FOR WHEEZE OR SHORTNESS OF BREATH 6.7 each 11   aspirin 81 MG tablet Take 81 mg by mouth at bedtime.     celecoxib (CELEBREX) 100 MG capsule TAKE 1 CAPSULE BY MOUTH EVERY DAY 90 capsule 1   Continuous Blood Gluc Receiver (FREESTYLE LIBRE READER) DEVI 1 Device by Does not apply route every 14 (fourteen) days. Freestyle libre 3 reader 1 each 0   Continuous Blood Gluc Sensor (FREESTYLE LIBRE 14 DAY SENSOR) MISC APPLY 1 SENSOR EVERY 14 DAYS 2 each 2   dapagliflozin propanediol (FARXIGA) 10 MG TABS tablet Take 1 tablet (10 mg total) by mouth daily before breakfast. 90 tablet 2   fexofenadine (ALLEGRA) 180 MG tablet Take 180 mg by mouth daily as needed for allergies.      gabapentin (NEURONTIN) 100 MG capsule TAKE 2 CAPSULES BY MOUTH 3 TIMES DAILY. 180 capsule 3   HYDROcodone-acetaminophen (NORCO) 7.5-325 MG tablet Take 1 tablet by mouth every 6 (six) hours as needed for severe pain (for chronic joint pain, neck pain). 90 tablet 0   LORazepam (ATIVAN) 1 MG  tablet TAKE 1/2 TO 1 TABLET BY MOUTH NIGHTLY AS NEEDED 30 tablet 5   metFORMIN (GLUCOPHAGE) 500 MG tablet TAKE 1 TABLET BY MOUTH TWICE A DAY WITH FOOD 180 tablet 0   montelukast (SINGULAIR) 10 MG tablet TAKE 1 TABLET BY MOUTH AT  BEDTIME 90 tablet 3   Multiple Vitamin (MULTIVITAMIN WITH MINERALS) TABS tablet Take 1 tablet by mouth daily.     nebivolol (BYSTOLIC) 5 MG tablet TAKE 1 TABLET BY MOUTH DAILY 90 tablet 1   nitroGLYCERIN (NITROSTAT) 0.4 MG SL tablet PLACE 1 TABLET (0.4 MG TOTAL) UNDER THE TONGUE EVERY 5 (FIVE) MINUTES X 3 DOSES AS NEEDED FOR CHEST PAIN. 75 tablet 1   Respiratory Therapy Supplies (FLUTTER) DEVI Twice a day and prn as needed, may increase if feeling worse 1 each 0   rosuvastatin (CRESTOR) 10 MG tablet TAKE 1 TABLET ON MONDAY, [place]. 45 tablet 3   SYMBICORT 160-4.5 MCG/ACT inhaler USE 1 TO 2 INHALATIONS BY  MOUTH EVERY 12 HOURS AS  NEEDED 30.6 g 3   vardenafil (LEVITRA) 20 MG tablet TAKE 1 TABLET BY MOUTH AS DIRECTED. CAN NOT BE TAKEN WITH NITROGLYCERIN. NOT COVERED BY INSURANCE 6 tablet 2   zinc gluconate 50 MG tablet Take 50 mg by mouth daily.     famotidine (PEPCID) 20 MG tablet Take 1 tablet (20 mg  total) by mouth daily. 30 tablet 0   No current facility-administered medications on file prior to visit.     Review of Systems  Constitutional:  Negative for fever.  Respiratory:  Positive for wheezing (occ). Negative for cough and shortness of breath.   Cardiovascular:  Negative for chest pain, palpitations and leg swelling.  Neurological:  Negative for light-headedness and headaches.       Objective:   Vitals:   12/31/22 0850  BP: 120/74  Pulse: 70  Temp: 97.9 F (36.6 C)  SpO2: 96%   BP Readings from Last 3 Encounters:  12/31/22 120/74  12/02/22 122/80  11/24/22 (!) 151/86   Wt Readings from Last 3 Encounters:  12/31/22 180 lb 12.8 oz (82 kg)  12/02/22 180 lb (81.6 kg)  08/19/22 179 lb 3.2 oz (81.3 kg)   Body mass index is 26.32  kg/m.    Physical Exam Constitutional:      General: He is not in acute distress.    Appearance: Normal appearance. He is not ill-appearing.  HENT:     Head: Normocephalic and atraumatic.  Eyes:     Conjunctiva/sclera: Conjunctivae normal.  Cardiovascular:     Rate and Rhythm: Normal rate and regular rhythm.     Heart sounds: Normal heart sounds. No murmur heard. Pulmonary:     Effort: Pulmonary effort is normal. No respiratory distress.     Breath sounds: Normal breath sounds. No wheezing or rales.  Musculoskeletal:     Right lower leg: No edema.     Left lower leg: No edema.  Skin:    General: Skin is warm and dry.     Findings: No rash.  Neurological:     Mental Status: He is alert. Mental status is at baseline.  Psychiatric:        Mood and Affect: Mood normal.        Lab Results  Component Value Date   WBC 6.8 02/16/2021   HGB 14.2 02/16/2021   HCT 41.9 02/16/2021   PLT 248.0 02/16/2021   GLUCOSE 127 (H) 07/06/2022   CHOL 122 03/17/2022   TRIG 103 03/17/2022   HDL 37 (L) 03/17/2022   LDLCALC 66 03/17/2022   ALT 29 03/17/2022   AST 26 03/17/2022   NA 138 07/06/2022   K 4.9 07/06/2022   CL 102 07/06/2022   CREATININE 1.18 07/06/2022   BUN 16 07/06/2022   CO2 23 07/06/2022   TSH 1.64 02/16/2021   PSA 0.79 08/31/2012   HGBA1C 7.2 (H) 03/23/2022   MICROALBUR 1.7 03/23/2022     Assessment & Plan:    See Problem List for Assessment and Plan of chronic medical problems.

## 2022-12-30 NOTE — Patient Instructions (Addendum)
      Blood work was ordered.   The lab is on the first floor.    Medications changes include :   none     Return in about 6 months (around 07/01/2023) for follow up.

## 2022-12-31 ENCOUNTER — Ambulatory Visit (INDEPENDENT_AMBULATORY_CARE_PROVIDER_SITE_OTHER): Payer: Medicare Other | Admitting: Internal Medicine

## 2022-12-31 VITALS — BP 120/74 | HR 70 | Temp 97.9°F | Ht 69.5 in | Wt 180.8 lb

## 2022-12-31 DIAGNOSIS — E1159 Type 2 diabetes mellitus with other circulatory complications: Secondary | ICD-10-CM

## 2022-12-31 DIAGNOSIS — J45909 Unspecified asthma, uncomplicated: Secondary | ICD-10-CM | POA: Diagnosis not present

## 2022-12-31 DIAGNOSIS — E1142 Type 2 diabetes mellitus with diabetic polyneuropathy: Secondary | ICD-10-CM | POA: Diagnosis not present

## 2022-12-31 DIAGNOSIS — E782 Mixed hyperlipidemia: Secondary | ICD-10-CM | POA: Diagnosis not present

## 2022-12-31 DIAGNOSIS — I5042 Chronic combined systolic (congestive) and diastolic (congestive) heart failure: Secondary | ICD-10-CM | POA: Diagnosis not present

## 2022-12-31 DIAGNOSIS — I10 Essential (primary) hypertension: Secondary | ICD-10-CM

## 2022-12-31 DIAGNOSIS — G479 Sleep disorder, unspecified: Secondary | ICD-10-CM

## 2022-12-31 DIAGNOSIS — M159 Polyosteoarthritis, unspecified: Secondary | ICD-10-CM

## 2022-12-31 DIAGNOSIS — G8929 Other chronic pain: Secondary | ICD-10-CM | POA: Diagnosis not present

## 2022-12-31 LAB — COMPREHENSIVE METABOLIC PANEL
ALT: 19 U/L (ref 0–53)
AST: 17 U/L (ref 0–37)
Albumin: 4.3 g/dL (ref 3.5–5.2)
Alkaline Phosphatase: 68 U/L (ref 39–117)
BUN: 20 mg/dL (ref 6–23)
CO2: 31 mEq/L (ref 19–32)
Calcium: 9.9 mg/dL (ref 8.4–10.5)
Chloride: 104 mEq/L (ref 96–112)
Creatinine, Ser: 1.1 mg/dL (ref 0.40–1.50)
GFR: 63.91 mL/min (ref >60.00)
Glucose, Bld: 107 mg/dL — ABNORMAL HIGH (ref 70–99)
Potassium: 5.1 mEq/L (ref 3.5–5.1)
Sodium: 142 mEq/L (ref 135–145)
Total Bilirubin: 0.3 mg/dL (ref 0.2–1.2)
Total Protein: 7 g/dL (ref 6.0–8.3)

## 2022-12-31 LAB — CBC WITH DIFFERENTIAL/PLATELET
Basophils Absolute: 0 10*3/uL (ref 0.0–0.1)
Basophils Relative: 0.8 % (ref 0.0–3.0)
Eosinophils Absolute: 0.3 10*3/uL (ref 0.0–0.7)
Eosinophils Relative: 4 % (ref 0.0–5.0)
HCT: 43.4 % (ref 39.0–52.0)
Hemoglobin: 14.2 g/dL (ref 13.0–17.0)
Lymphocytes Relative: 15.7 % (ref 12.0–46.0)
Lymphs Abs: 1 10*3/uL (ref 0.7–4.0)
MCHC: 32.8 g/dL (ref 30.0–36.0)
MCV: 87.4 fl (ref 78.0–100.0)
Monocytes Absolute: 0.7 10*3/uL (ref 0.1–1.0)
Monocytes Relative: 11 % (ref 3.0–12.0)
Neutro Abs: 4.3 10*3/uL (ref 1.4–7.7)
Neutrophils Relative %: 68.5 % (ref 43.0–77.0)
Platelets: 240 10*3/uL (ref 150.0–400.0)
RBC: 4.97 Mil/uL (ref 4.22–5.81)
RDW: 15.3 % (ref 11.5–15.5)
WBC: 6.3 10*3/uL (ref 4.0–10.5)

## 2022-12-31 LAB — LIPID PANEL
Cholesterol: 115 mg/dL (ref 0–200)
HDL: 40.7 mg/dL (ref >39.00)
LDL Cholesterol: 57 mg/dL (ref 0–99)
NonHDL: 74.46
Total CHOL/HDL Ratio: 3
Triglycerides: 88 mg/dL (ref 0.0–149.0)
VLDL: 17.6 mg/dL (ref 0.0–40.0)

## 2022-12-31 LAB — MICROALBUMIN / CREATININE URINE RATIO
Creatinine,U: 53 mg/dL
Microalb Creat Ratio: 1.3 mg/g (ref 0.0–30.0)
Microalb, Ur: <0.7 mg/dL (ref 0.0–1.9)

## 2022-12-31 LAB — HEMOGLOBIN A1C: Hgb A1c MFr Bld: 7 % — ABNORMAL HIGH (ref 4.6–6.5)

## 2022-12-31 NOTE — Assessment & Plan Note (Addendum)
Chronic Generalized Stable  Pain management with Norco as needed Pain controlled.  Taking medication appropriately. Continue Norco 7.5-325 mg 1 tab every 6 hours as needed Continue gabapentin 200 mg 3 times daily

## 2022-12-31 NOTE — Assessment & Plan Note (Signed)
Chronic generalized arthritis Pain controlled with Norco 7.5 mg - 325 mg-1 pill every 6 hours as needed Resign pain contract today Check urine tox [place] controlled substance database checked-taking medication appropriately Refill not due today

## 2022-12-31 NOTE — Assessment & Plan Note (Signed)
Chronic Following with cardiology Euvolemic on exam On Farxiga 10 mg daily, nebivolol 5 mg daily

## 2022-12-31 NOTE — Assessment & Plan Note (Signed)
Chronic Continue gabapentin 200 mg 3 times daily

## 2022-12-31 NOTE — Assessment & Plan Note (Signed)
Chronic   Lab Results  Component Value Date   HGBA1C 7.2 (H) 03/23/2022   Sugars not ideally controlled Testing sugars 1 times a day Check A1c, urine microalbumin today Continue Farxiga 10 mg daily, metformin 500 mg twice daily Stressed regular exercise, diabetic diet

## 2022-12-31 NOTE — Assessment & Plan Note (Signed)
Chronic Following with pulmonary Currently controlled Continue singular 10 mg nightly, Allegra as needed Continue Symbicort 160-4.5 mcg per ACT twice daily, albuterol as needed

## 2022-12-31 NOTE — Assessment & Plan Note (Signed)
Chronic Blood pressure well controlled CMP Continue Bystolic 5 mg daily

## 2023-01-03 ENCOUNTER — Encounter: Payer: Self-pay | Admitting: Internal Medicine

## 2023-01-03 LAB — DRUG MONITOR, OPIATES,W/CONF, URINE
Codeine: NEGATIVE ng/mL (ref <50)
Hydrocodone: 439 ng/mL — ABNORMAL HIGH (ref <50)
Hydromorphone: 316 ng/mL — ABNORMAL HIGH (ref <50)
Morphine: NEGATIVE ng/mL (ref <50)
Norhydrocodone: 377 ng/mL — ABNORMAL HIGH (ref <50)
Opiates: POSITIVE ng/mL — AB (ref <100)

## 2023-01-03 LAB — DRUG MONITOR, BENZO,W/CONF, URINE
Alphahydroxyalprazolam: NEGATIVE ng/mL (ref <25)
Alphahydroxymidazolam: NEGATIVE ng/mL (ref <50)
Alphahydroxytriazolam: NEGATIVE ng/mL (ref <50)
Aminoclonazepam: NEGATIVE ng/mL (ref <25)
Benzodiazepines: POSITIVE ng/mL — AB (ref <100)
Hydroxyethylflurazepam: NEGATIVE ng/mL (ref <50)
Lorazepam: 405 ng/mL — ABNORMAL HIGH (ref <50)
Nordiazepam: NEGATIVE ng/mL (ref <50)
Oxazepam: NEGATIVE ng/mL (ref <50)
Temazepam: NEGATIVE ng/mL (ref <50)

## 2023-01-03 LAB — DM TEMPLATE

## 2023-01-03 LAB — DRUG MONITOR, COCAINEMETAB, W/CONF, URINE: Cocaine Metabolite: NEGATIVE ng/mL (ref <150)

## 2023-01-03 LAB — DRUG MONITOR, OXYCODONE,W/CONF, URINE: Oxycodone: NEGATIVE ng/mL (ref <100)

## 2023-01-03 LAB — DRUG MONITOR, TRAMADOL,QN, URINE
Desmethyltramadol: NEGATIVE ng/mL (ref <100)
Tramadol: NEGATIVE ng/mL (ref <100)

## 2023-01-03 LAB — PRESCRIBED DRUGS,MEDMATCH(R)

## 2023-01-03 LAB — DRUG MONITOR,AMPHETAMINE,W/CONF, URINE: Amphetamines: NEGATIVE ng/mL (ref <500)

## 2023-01-03 LAB — DRUG MONITOR,BARBITURATE,W/CONF, URINE: Barbiturates: NEGATIVE ng/mL (ref <300)

## 2023-01-03 MED ORDER — METFORMIN HCL 500 MG PO TABS: ORAL_TABLET | ORAL | 2 refills | Status: DC

## 2023-01-18 ENCOUNTER — Ambulatory Visit: Payer: Medicare Other | Admitting: Cardiology

## 2023-01-22 NOTE — Progress Notes (Unsigned)
Cardiology Office Note:    Date:  01/26/2023   ID:  Jeffery [name], DOB 17-Jun-1941, MRN [id number]  PCP:  [name], MD   [facility] Cardiologist:  Sinclair Grooms, MD (Inactive) Electrophysiologist:  Cristopher Peru, MD {  Referring MD: [name], MD    History of Present Illness:    Jeffery [name] is a 82 y.o. 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 Tamala Julian who now presents to clinic for follow-up.  TTE 03/2022 with LVEF 40-45%, G3DD, mild RV enlargement with mild PHTN, trivial MR. Cath 2002 with mild nonobstructive disease.   Was last seen by Dr. Tamala Julian in 03/2022 where he was stable from a CV standpoint.  Today, the patient states that he was feeling well until this morning where he developed generalized malaise. His grandson has been sick and vomited this morning. He is worried he may be getting a viral GI illness. He is otherwise doing well from a CV standpoint. No chest pain, LE edema, orthopnea, PND, orthopnea or PND. No palpitations. Tolerating medications as prescribed. Remains active without symptoms. Declined starting new medications at this time.   Past Medical History:  Diagnosis Date   Arthritis    "thumbs" (07/25/2018)   Asthma    CHF (congestive heart failure) (HCC)    Chronic bronchitis (HCC)    Environmental allergies    GERD (gastroesophageal reflux disease)    Headache    "seasonal; w/environmental allergies" (07/25/2018)   History of blood transfusion    "when I had laminectomy" (07/25/2018)   HTN (hypertension)    Hyperlipidemia    LBBB (left bundle branch block) 1999   Metatarsal bone fracture 03/07/2018   Myocardial infarction [facility])    "was told I've had an old MI; probably in the 33s" (07/25/2018)   Pneumonia    "as a child, age 84; viral pneumonia 3 times in the last 10 years" (07/25/2018)    Presence of permanent cardiac pacemaker 07/25/2018   Seasonal allergies    Sleep apnea    "wife says I do" (07/25/2018)   Type II diabetes mellitus ([place])     Past Surgical History:  Procedure Laterality Date   BACK SURGERY     BIV PACEMAKER INSERTION CRT-P  07/25/2018   BIV PACEMAKER INSERTION CRT-P N/A 07/25/2018   Procedure: BIV PACEMAKER INSERTION CRT-P;  Surgeon: Evans Lance, MD;  Location: [place] [facility];  Service: Cardiovascular;  Laterality: N/A;   CARDIAC CATHETERIZATION  2003   Dr Pernell Dupre; 85 % R circumflex obstruction   CARPAL TUNNEL RELEASE Left 10/11/2014   Procedure: LEFT CARPAL TUNNEL RELEASE;  Surgeon: Roseanne Kaufman, MD;  Location: [place];  Service: Orthopedics;  Laterality: Left;   COLONOSCOPY W/ BIOPSIES AND POLYPECTOMY  2018   INGUINAL HERNIA REPAIR Right 1948   INGUINAL HERNIA REPAIR Left 1988   LUMBAR LAMINECTOMY  1984   MINOR CARPAL TUNNEL Right 11/22/2014   Procedure: RIGHT LIMITED OPEN CARPAL TUNNEL RELEASE;  Surgeon: Roseanne Kaufman, MD;  Location: [place];  Service: Orthopedics;  Laterality: Right;   TONSILLECTOMY  1958    Current Medications: Current Meds  Medication Sig   ACCU-CHEK AVIVA PLUS test strip USE AS INSTRUCTED TO TEST BLOOD SUGAR 6 TIMES DAILY. DX CODE: E11.59   ACCU-CHEK SOFTCLIX LANCETS lancets Use to test blood  sugar daily as directed.   albuterol (PROVENTIL) (2.5 MG/3ML) 0.083% nebulizer solution TAKE 3 MLS BY NEBULIZATION EVERY 6 (SIX) HOURS AS NEEDED FOR WHEEZING OR SHORTNESS OF BREATH.   albuterol (VENTOLIN HFA) 108 (90 Base) MCG/ACT inhaler TAKE 2 PUFFS BY MOUTH EVERY 6 HOURS AS NEEDED FOR WHEEZE OR SHORTNESS OF BREATH   aspirin 81 MG tablet Take 81 mg by mouth at bedtime.   celecoxib (CELEBREX) 100 MG capsule TAKE 1 CAPSULE BY MOUTH EVERY DAY   Continuous Blood Gluc Receiver (FREESTYLE LIBRE READER) DEVI 1 Device by Does not apply route every 14 (fourteen) days. Freestyle libre 3 reader    Continuous Blood Gluc Sensor (FREESTYLE LIBRE 14 DAY SENSOR) MISC APPLY 1 SENSOR EVERY 14 DAYS   dapagliflozin propanediol (FARXIGA) 10 MG TABS tablet Take 1 tablet (10 mg total) by mouth daily before breakfast.   fexofenadine (ALLEGRA) 180 MG tablet Take 180 mg by mouth daily as needed for allergies.    gabapentin (NEURONTIN) 100 MG capsule TAKE 2 CAPSULES BY MOUTH 3 TIMES DAILY.   HYDROcodone-acetaminophen (NORCO) 7.5-325 MG tablet Take 1 tablet by mouth every 6 (six) hours as needed for severe pain (for chronic joint pain, neck pain).   LORazepam (ATIVAN) 1 MG tablet TAKE 1/2 TO 1 TABLET BY MOUTH NIGHTLY AS NEEDED   metFORMIN (GLUCOPHAGE) 500 MG tablet Take 500 mg with breakfast and 1000 mg with dinner   montelukast (SINGULAIR) 10 MG tablet TAKE 1 TABLET BY MOUTH AT  BEDTIME   Multiple Vitamin (MULTIVITAMIN WITH MINERALS) TABS tablet Take 1 tablet by mouth daily.   nebivolol (BYSTOLIC) 5 MG tablet TAKE 1 TABLET BY MOUTH DAILY   nitroGLYCERIN (NITROSTAT) 0.4 MG SL tablet PLACE 1 TABLET (0.4 MG TOTAL) UNDER THE TONGUE EVERY 5 (FIVE) MINUTES X 3 DOSES AS NEEDED FOR CHEST PAIN.   Respiratory Therapy Supplies (FLUTTER) DEVI Twice a day and prn as needed, may increase if feeling worse   rosuvastatin (CRESTOR) 10 MG tablet TAKE 1 TABLET ON MONDAY, [place].   SYMBICORT 160-4.5 MCG/ACT inhaler USE 1 TO 2 INHALATIONS BY  MOUTH EVERY 12 HOURS AS  NEEDED   vardenafil (LEVITRA) 20 MG tablet TAKE 1 TABLET BY MOUTH AS DIRECTED. CAN NOT BE TAKEN WITH NITROGLYCERIN. NOT COVERED BY INSURANCE   zinc gluconate 50 MG tablet Take 50 mg by mouth daily.     Allergies:   Contrast media [iodinated contrast media] and Iodine   Social History   Socioeconomic History   Marital status: Married    Spouse name: Not on file   Number of children: 3   Years of education: Not on file   Highest education level: Professional school degree (e.g., MD, DDS, DVM, JD)  Occupational History   Occupation: retired  Pension scheme manager: [employer]  Tobacco Use   Smoking status: Former    Packs/day: 2.00    Years: 2.00    Total pack years: 4.00    Types: Cigarettes    Quit date: 12/14/1963    Years since quitting: 59.1   Smokeless tobacco: Never  Vaping Use   Vaping Use: Never used  Substance and Sexual Activity   Alcohol use: Not Currently    Alcohol/week: 2.0 standard drinks of alcohol    Types: 2 Glasses of wine per week    Comment: occasional   Drug use: Never   Sexual activity: Yes  Other Topics Concern   Not on file  Social History Narrative   Lives  with wife in a 2 story home.  Has 3 daughters.  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     Social Determinants of Health   Financial Resource Strain: Low Risk  (05/20/2022)   Overall Financial Resource Strain (CARDIA)    Difficulty of Paying Living Expenses: Not hard at all  Food Insecurity: No Food Insecurity (05/20/2022)   Hunger Vital Sign    Worried About Running Out of Food in the Last Year: Never true    Ran Out of Food in the Last Year: Never true  Transportation Needs: No Transportation Needs (05/20/2022)   PRAPARE - Hydrologist (Medical): No    Lack of Transportation (Non-Medical): No  Physical Activity: Unknown (05/20/2022)   Exercise Vital Sign    Days of Exercise per Week: Not on file    Minutes of Exercise per Session: 30 min  Stress: No Stress Concern Present (05/20/2022)   [place]    Feeling of Stress : Not at all  Social Connections: [place] (05/20/2022)   Social Connection and Isolation Panel [NHANES]    Frequency of Communication with Friends and Family: More than three times a week    Frequency of Social Gatherings with Friends and Family: More than three times a week    Attends Religious Services: More than 4 times per year    Active Member of [employer] or Organizations: Yes    Attends Arts administrator: More than 4 times per year    Marital Status: Married     Family History: The patient's family history includes Colon polyps in his father; Diabetes in his maternal aunt; Heart attack in his father; Heart failure in his mother; Hypertension in his mother; Subarachnoid hemorrhage (age of onset: 62) in his mother; Subarachnoid hemorrhage (age of onset: 16) in his paternal grandfather. There is no history of Colon cancer.  ROS:   Please see the history of present illness.     All other systems reviewed and are negative.  EKGs/Labs/Other Studies Reviewed:    The following studies were reviewed today: TTE 03/24/22: IMPRESSIONS     1. Left ventricular ejection fraction, by estimation, is 40 to 45%. The  left ventricle has mildly decreased function. The left ventricle  demonstrates global hypokinesis. Left ventricular diastolic parameters are  consistent with Grade III diastolic  dysfunction (restrictive). Elevated left atrial pressure.   2. Right ventricular systolic function is mildly reduced. The right  ventricular size is mildly enlarged. There is mildly elevated pulmonary  artery systolic pressure. The estimated right ventricular systolic  pressure is 123XX123 mmHg.   3. The mitral valve is normal in structure. Trivial mitral valve  regurgitation. No evidence of mitral stenosis.   4. The aortic valve was not well visualized. Aortic valve regurgitation  is not visualized. No aortic stenosis is present.   5. The inferior vena cava is normal in size with greater than 50%  respiratory variability, suggesting right atrial pressure of 3 mmHg.    EKG:  EKG is not ordered today.    Recent Labs: 12/31/2022: ALT 19; BUN 20; Creatinine, Ser 1.10; Hemoglobin 14.2; Platelets 240.0; Potassium 5.1; Sodium 142  Recent Lipid Panel    Component Value Date/Time   CHOL 115 12/31/2022 0957   CHOL 122 03/17/2022 1037   CHOL 143 08/20/2014 0750   TRIG 88.0 12/31/2022 0957   TRIG 145  08/20/2014 0750   HDL 40.70 12/31/2022 0957  HDL 37 (L) 03/17/2022 1037   HDL 33 (L) 08/20/2014 0750   CHOLHDL 3 12/31/2022 0957   VLDL 17.6 12/31/2022 0957   LDLCALC 57 12/31/2022 0957   LDLCALC 66 03/17/2022 1037   LDLCALC 62 08/20/2020 1144   LDLCALC 81 08/20/2014 0750     Risk Assessment/Calculations:                Physical Exam:    VS:  BP 126/80   Pulse 65   Ht 5' 9.5" (1.765 m)   Wt 184 lb (83.5 kg)   SpO2 97%   BMI 26.78 kg/m     Wt Readings from Last 3 Encounters:  01/26/23 184 lb (83.5 kg)  12/31/22 180 lb 12.8 oz (82 kg)  12/02/22 180 lb (81.6 kg)     GEN:  Well nourished, well developed in no acute distress HEENT: Normal NECK: No JVD; No carotid bruits CARDIAC: Bradycardic regular, 1/6 systolic murmur RESPIRATORY:  Clear to auscultation without rales, wheezing or rhonchi  ABDOMEN: Soft, non-tender, non-distended MUSCULOSKELETAL:  No edema; No deformity  SKIN: Warm and dry NEUROLOGIC:  Alert and oriented x 3 PSYCHIATRIC:  Normal affect   ASSESSMENT:    1. Chronic combined systolic and diastolic CHF (congestive heart failure) ([place])   2. Biventricular cardiac pacemaker in situ   3. Left bundle branch block   4. Coronary artery disease involving native coronary artery of native heart without angina pectoris   5. Hyperlipidemia, unspecified hyperlipidemia type   6. Type 2 diabetes mellitus with vascular disease (HCC)    PLAN:    In order of problems listed above:  #Chronic Combined Systolic and Diastolic HF: LVEF [id number] with G3DD, mild RV dysfunction and mild pulmonary HTN. Cath in 2002 with mild nonobstructive disease. Currently euvolemic and compensated with NYHA class I to II/  -Continue farxiga 37m daily -Continue bystolic 534mdaily -Declined other medications today  #High Degree AVB with history of Stokes Adams Syncope: S/p PPM placement. Follows with Dr. TaLovena Le #Nonobstructive CAD: No anginal symptoms. -Continue ASA 8118mdaily -Continue crestor 23m36mily  #HLD: LDL well controlled at 57. -Continue crestor 23mg90mly  #HTN: -Continue bystolic 5mg d63my  #DMII: -Management per PCP -Continue farxiga 23mg d68m -A1C 7.0%       Medication Adjustments/Labs and Tests Ordered: Current medicines are reviewed at length with the patient today.  Concerns regarding medicines are outlined above.  No orders of the defined types were placed in this encounter.  No orders of the defined types were placed in this encounter.   Patient Instructions  Medication Instructions:   Your physician recommends that you continue on your current medications as directed. Please refer to the Current Medication list given to you today.  *If you need a refill on your cardiac medications before your next appointment, please call your pharmacy*    Follow-Up: At [facility] your health needs are our priority.  As part of our continuing mission to provide you with exceptional heart care, we have created designated Provider Care Teams.  These Care Teams include your primary Cardiologist (physician) and Advanced Practice Providers (APPs -  Physician Assistants and Nurse Practitioners) who all work together to provide you with the care you need, when you need it.  We recommend signing up for the patient portal called "MyChart".  Sign up information is provided on this After Visit Summary.  MyChart is used to connect with patients for Virtual Visits (Telemedicine).  Patients are  able to view lab/test results, encounter notes, upcoming appointments, etc.  Non-urgent messages can be sent to your provider as well.   To learn more about what you can do with MyChart, go to [email or web address].    Your next appointment:   1 year(s)  Provider:   DR. Johney Frame      Signed, Freada Bergeron, MD  01/26/2023 11:49 AM    [place]

## 2023-01-25 ENCOUNTER — Ambulatory Visit: Payer: Medicare Other | Admitting: Cardiology

## 2023-01-26 ENCOUNTER — Ambulatory Visit: Payer: Medicare Other | Attending: Cardiology | Admitting: Cardiology

## 2023-01-26 ENCOUNTER — Other Ambulatory Visit: Payer: Self-pay | Admitting: Internal Medicine

## 2023-01-26 ENCOUNTER — Encounter: Payer: Self-pay | Admitting: Cardiology

## 2023-01-26 VITALS — BP 126/80 | HR 65 | Ht 69.5 in | Wt 184.0 lb

## 2023-01-26 DIAGNOSIS — I447 Left bundle-branch block, unspecified: Secondary | ICD-10-CM | POA: Diagnosis not present

## 2023-01-26 DIAGNOSIS — I5042 Chronic combined systolic (congestive) and diastolic (congestive) heart failure: Secondary | ICD-10-CM | POA: Diagnosis not present

## 2023-01-26 DIAGNOSIS — E1159 Type 2 diabetes mellitus with other circulatory complications: Secondary | ICD-10-CM

## 2023-01-26 DIAGNOSIS — I2510 Atherosclerotic heart disease of native coronary artery without angina pectoris: Secondary | ICD-10-CM | POA: Diagnosis not present

## 2023-01-26 DIAGNOSIS — E785 Hyperlipidemia, unspecified: Secondary | ICD-10-CM

## 2023-01-26 DIAGNOSIS — Z950 Presence of cardiac pacemaker: Secondary | ICD-10-CM

## 2023-01-26 MED ORDER — HYDROCODONE-ACETAMINOPHEN 7.5-325 MG PO TABS: 1.0000 | ORAL_TABLET | Freq: Four times a day (QID) | ORAL | 0 refills | Status: DC | PRN

## 2023-01-26 NOTE — Patient Instructions (Signed)
Medication Instructions:   Your physician recommends that you continue on your current medications as directed. Please refer to the Current Medication list given to you today.  *If you need a refill on your cardiac medications before your next appointment, please call your pharmacy*    Follow-Up: At [facility], you and your health needs are our priority.  As part of our continuing mission to provide you with exceptional heart care, we have created designated Provider Care Teams.  These Care Teams include your primary Cardiologist (physician) and Advanced Practice Providers (APPs -  Physician Assistants and Nurse Practitioners) who all work together to provide you with the care you need, when you need it.  We recommend signing up for the patient portal called "MyChart".  Sign up information is provided on this After Visit Summary.  MyChart is used to connect with patients for Virtual Visits (Telemedicine).  Patients are able to view lab/test results, encounter notes, upcoming appointments, etc.  Non-urgent messages can be sent to your provider as well.   To learn more about what you can do with MyChart, go to [email or web address].    Your next appointment:   1 year(s)  Provider:   DR. Johney Frame

## 2023-01-28 ENCOUNTER — Ambulatory Visit (INDEPENDENT_AMBULATORY_CARE_PROVIDER_SITE_OTHER): Payer: Medicare Other

## 2023-01-28 DIAGNOSIS — I441 Atrioventricular block, second degree: Secondary | ICD-10-CM

## 2023-01-28 LAB — CUP PACEART REMOTE DEVICE CHECK
Battery Remaining Longevity: 38 mo
Battery Remaining Percentage: 43 %
Battery Voltage: 2.96 V
Brady Statistic AP VP Percent: 35 %
Brady Statistic AP VS Percent: 1 %
Brady Statistic AS VP Percent: 64 %
Brady Statistic AS VS Percent: 1 %
Brady Statistic RA Percent Paced: 35 %
Date Time Interrogation Session: 20240216024510
Implantable Lead Connection Status: 753985
Implantable Lead Connection Status: 753985
Implantable Lead Connection Status: 753985
Implantable Lead Implant Date: 20190813
Implantable Lead Implant Date: 20190813
Implantable Lead Implant Date: 20190813
Implantable Lead Location: 753858
Implantable Lead Location: 753859
Implantable Lead Location: 753860
Implantable Pulse Generator Implant Date: 20190813
Lead Channel Impedance Value: 440 Ohm
Lead Channel Impedance Value: 460 Ohm
Lead Channel Impedance Value: 660 Ohm
Lead Channel Pacing Threshold Amplitude: 0.625 V
Lead Channel Pacing Threshold Amplitude: 0.75 V
Lead Channel Pacing Threshold Amplitude: 1.125 V
Lead Channel Pacing Threshold Pulse Width: 0.5 ms
Lead Channel Pacing Threshold Pulse Width: 0.5 ms
Lead Channel Pacing Threshold Pulse Width: 0.5 ms
Lead Channel Sensing Intrinsic Amplitude: 1.5 mV
Lead Channel Sensing Intrinsic Amplitude: 12 mV
Lead Channel Setting Pacing Amplitude: 1.625
Lead Channel Setting Pacing Amplitude: 2.125
Lead Channel Setting Pacing Amplitude: 2.5 V
Lead Channel Setting Pacing Pulse Width: 0.5 ms
Lead Channel Setting Pacing Pulse Width: 0.5 ms
Lead Channel Setting Sensing Sensitivity: 5 mV
Pulse Gen Model: 3562
Pulse Gen Serial Number: 9043864

## 2023-02-22 NOTE — Progress Notes (Signed)
Remote pacemaker transmission.   

## 2023-02-23 ENCOUNTER — Other Ambulatory Visit: Payer: Self-pay | Admitting: Internal Medicine

## 2023-02-23 ENCOUNTER — Other Ambulatory Visit: Payer: Self-pay | Admitting: Family Medicine

## 2023-02-23 MED ORDER — HYDROCODONE-ACETAMINOPHEN 7.5-325 MG PO TABS: 1.0000 | ORAL_TABLET | Freq: Four times a day (QID) | ORAL | 0 refills | Status: DC | PRN

## 2023-02-24 ENCOUNTER — Encounter: Payer: Self-pay | Admitting: Podiatry

## 2023-02-24 ENCOUNTER — Other Ambulatory Visit: Payer: Self-pay | Admitting: Internal Medicine

## 2023-02-24 ENCOUNTER — Ambulatory Visit: Payer: Medicare Other | Admitting: Podiatry

## 2023-02-24 DIAGNOSIS — M779 Enthesopathy, unspecified: Secondary | ICD-10-CM | POA: Diagnosis not present

## 2023-02-24 MED ORDER — TRIAMCINOLONE ACETONIDE 10 MG/ML IJ SUSP
10.0000 mg | Freq: Once | INTRAMUSCULAR | Status: AC
  Administered 2023-02-24: 10 mg

## 2023-02-25 NOTE — Progress Notes (Signed)
Subjective:   Patient ID: Jeffery [name], male   DOB: 82 y.o.   MRN: [id number]   HPI Patient presents with discomfort in the lateral side of the left foot states it is inflamed and that he tries padding   ROS      Objective:  Physical Exam  Neurovascular status intact inflammation around the base of the fifth metatarsal left with fluid buildup around the area with patient also found to have keratotic lesion in this area and in the lesser metatarsals plantarly     Assessment:  Inflammatory tendinitis of the peroneal insertion base of fifth metatarsal with lesion that also forms     Plan:  Reviewed difficult element of this condition I did go ahead today I did sterile prep and I injected the tendon complex at base 3 mg Dexasone Kenalog 5 mg Xylocaine debrided lesions continue cushioning the area and reappoint as symptoms indicate  X-rays do not indicate that there is any bony pathology associated with this appears to be soft tissue

## 2023-03-08 ENCOUNTER — Other Ambulatory Visit: Payer: Self-pay | Admitting: Internal Medicine

## 2023-03-18 ENCOUNTER — Other Ambulatory Visit: Payer: Self-pay | Admitting: Internal Medicine

## 2023-03-22 ENCOUNTER — Encounter: Payer: Self-pay | Admitting: Cardiology

## 2023-03-22 MED ORDER — NITROGLYCERIN 0.4 MG SL SUBL: 0.4000 mg | SUBLINGUAL_TABLET | SUBLINGUAL | 1 refills | Status: AC | PRN

## 2023-03-25 ENCOUNTER — Other Ambulatory Visit: Payer: Self-pay | Admitting: Internal Medicine

## 2023-03-28 MED ORDER — HYDROCODONE-ACETAMINOPHEN 7.5-325 MG PO TABS: 1.0000 | ORAL_TABLET | Freq: Four times a day (QID) | ORAL | 0 refills | Status: DC | PRN

## 2023-03-31 ENCOUNTER — Telehealth: Payer: Self-pay | Admitting: Family Medicine

## 2023-03-31 NOTE — Telephone Encounter (Signed)
Per a verbal from Dr. Katrinka Blazing, patient will need a thyroid US and recommended that he consult primary care to get order and referral.

## 2023-03-31 NOTE — Telephone Encounter (Signed)
Patient called stating that Dr Katrinka Blazing told him anytime he needed a referral to call us. He is requesting a referral to ENT for a thyroid biopsy to rule out CA. He did not have a preference on location and was fine with whoever Dr Katrinka Blazing would recommend.   Please advise.

## 2023-04-23 ENCOUNTER — Other Ambulatory Visit: Payer: Self-pay | Admitting: Internal Medicine

## 2023-04-26 MED ORDER — HYDROCODONE-ACETAMINOPHEN 7.5-325 MG PO TABS: 1.0000 | ORAL_TABLET | Freq: Four times a day (QID) | ORAL | 0 refills | Status: DC | PRN

## 2023-04-29 ENCOUNTER — Ambulatory Visit (INDEPENDENT_AMBULATORY_CARE_PROVIDER_SITE_OTHER): Payer: Medicare Other

## 2023-04-29 DIAGNOSIS — I441 Atrioventricular block, second degree: Secondary | ICD-10-CM | POA: Diagnosis not present

## 2023-04-29 LAB — CUP PACEART REMOTE DEVICE CHECK
Battery Remaining Longevity: 35 mo
Battery Remaining Percentage: 40 %
Battery Voltage: 2.96 V
Brady Statistic AP VP Percent: 36 %
Brady Statistic AP VS Percent: 1 %
Brady Statistic AS VP Percent: 63 %
Brady Statistic AS VS Percent: 1 %
Brady Statistic RA Percent Paced: 36 %
Date Time Interrogation Session: 20240517020009
Implantable Lead Connection Status: 753985
Implantable Lead Connection Status: 753985
Implantable Lead Connection Status: 753985
Implantable Lead Implant Date: 20190813
Implantable Lead Implant Date: 20190813
Implantable Lead Implant Date: 20190813
Implantable Lead Location: 753858
Implantable Lead Location: 753859
Implantable Lead Location: 753860
Implantable Pulse Generator Implant Date: 20190813
Lead Channel Impedance Value: 430 Ohm
Lead Channel Impedance Value: 450 Ohm
Lead Channel Impedance Value: 650 Ohm
Lead Channel Pacing Threshold Amplitude: 0.625 V
Lead Channel Pacing Threshold Amplitude: 0.75 V
Lead Channel Pacing Threshold Amplitude: 1.375 V
Lead Channel Pacing Threshold Pulse Width: 0.5 ms
Lead Channel Pacing Threshold Pulse Width: 0.5 ms
Lead Channel Pacing Threshold Pulse Width: 0.5 ms
Lead Channel Sensing Intrinsic Amplitude: 11.5 mV
Lead Channel Sensing Intrinsic Amplitude: 2.2 mV
Lead Channel Setting Pacing Amplitude: 1.625
Lead Channel Setting Pacing Amplitude: 2.375
Lead Channel Setting Pacing Amplitude: 2.5 V
Lead Channel Setting Pacing Pulse Width: 0.5 ms
Lead Channel Setting Pacing Pulse Width: 0.5 ms
Lead Channel Setting Sensing Sensitivity: 5 mV
Pulse Gen Model: 3562
Pulse Gen Serial Number: 9043864

## 2023-05-11 NOTE — Progress Notes (Signed)
Remote pacemaker transmission.   

## 2023-05-18 ENCOUNTER — Other Ambulatory Visit: Payer: Self-pay | Admitting: Internal Medicine

## 2023-05-26 ENCOUNTER — Other Ambulatory Visit: Payer: Self-pay | Admitting: Internal Medicine

## 2023-05-27 MED ORDER — HYDROCODONE-ACETAMINOPHEN 7.5-325 MG PO TABS: 1.0000 | ORAL_TABLET | Freq: Four times a day (QID) | ORAL | 0 refills | Status: DC | PRN

## 2023-05-30 ENCOUNTER — Encounter: Payer: Self-pay | Admitting: Family Medicine

## 2023-06-01 ENCOUNTER — Encounter: Payer: Self-pay | Admitting: Cardiology

## 2023-06-06 ENCOUNTER — Other Ambulatory Visit: Payer: Self-pay | Admitting: Internal Medicine

## 2023-06-07 ENCOUNTER — Ambulatory Visit (INDEPENDENT_AMBULATORY_CARE_PROVIDER_SITE_OTHER): Payer: Medicare Other

## 2023-06-07 VITALS — Ht 69.5 in | Wt 179.2 lb

## 2023-06-07 DIAGNOSIS — Z0000 Encounter for general adult medical examination without abnormal findings: Secondary | ICD-10-CM

## 2023-06-07 NOTE — Progress Notes (Signed)
Subjective:   Jeffery [name] is a 82 y.o. male who presents for Medicare Annual/Subsequent preventive examination.  Visit Complete: Virtual  I connected with  Jeffery [name] on 06/07/23 by a audio enabled telemedicine application and verified that I am speaking with the correct person using two identifiers.  Patient Location: Home  Provider Location: Office/Clinic  I discussed the limitations of evaluation and management by telemedicine. The patient expressed understanding and agreed to proceed.  Review of Systems     Cardiac Risk Factors include: advanced age (>58men, >34 women);diabetes mellitus;dyslipidemia;family history of premature cardiovascular disease;hypertension;male gender     Objective:    Today's Vitals   06/07/23 0917 06/07/23 0920  Weight: 179 lb 3.2 oz (81.3 kg)   Height: 5' 9.5" (1.765 m)   PainSc: 3  3   PainLoc: Generalized    Body mass index is 26.08 kg/m.     06/07/2023    9:27 AM 05/20/2022    2:54 PM 05/14/2021    1:51 PM 12/01/2020   10:35 AM 11/26/2020    8:55 AM 12/11/2018    6:00 PM 12/11/2018   10:54 AM  Advanced Directives  Does Patient Have a Medical Advance Directive? Yes Yes Yes No Yes Yes Yes  Type of Estate agent of [place];Living will Living will;Healthcare Power of Attorney Living will;Healthcare Power of Attorney   Living will Living will  Does patient want to make changes to medical advance directive?  No - Patient declined No - Patient declined  No - Patient declined No - Patient declined   Copy of Healthcare Power of Attorney in Chart? No - copy requested No - copy requested No - copy requested        Current Medications (verified) Outpatient Encounter Medications as of 06/07/2023  Medication Sig   ACCU-CHEK AVIVA PLUS test strip USE AS INSTRUCTED TO TEST BLOOD SUGAR 6 TIMES DAILY. DX CODE: E11.59   ACCU-CHEK SOFTCLIX LANCETS lancets Use to test blood sugar daily as directed.   albuterol  (PROVENTIL) (2.5 MG/3ML) 0.083% nebulizer solution TAKE 3 MLS BY NEBULIZATION EVERY 6 (SIX) HOURS AS NEEDED FOR WHEEZING OR SHORTNESS OF BREATH.   albuterol (VENTOLIN HFA) 108 (90 Base) MCG/ACT inhaler TAKE 2 PUFFS BY MOUTH EVERY 6 HOURS AS NEEDED FOR WHEEZE OR SHORTNESS OF BREATH   aspirin 81 MG tablet Take 81 mg by mouth at bedtime.   celecoxib (CELEBREX) 100 MG capsule TAKE 1 CAPSULE BY MOUTH EVERY DAY   Continuous Blood Gluc Receiver (FREESTYLE LIBRE READER) DEVI 1 Device by Does not apply route every 14 (fourteen) days. Freestyle libre 3 reader   dapagliflozin propanediol (FARXIGA) 10 MG TABS tablet Take 1 tablet (10 mg total) by mouth daily before breakfast.   famotidine (PEPCID) 20 MG tablet Take 1 tablet (20 mg total) by mouth daily.   fexofenadine (ALLEGRA) 180 MG tablet Take 180 mg by mouth daily as needed for allergies.    gabapentin (NEURONTIN) 100 MG capsule TAKE 2 CAPSULES BY MOUTH 3 TIMES DAILY   HYDROcodone-acetaminophen (NORCO) 7.5-325 MG tablet Take 1 tablet by mouth every 6 (six) hours as needed for severe pain (for chronic joint pain, neck pain).   LORazepam (ATIVAN) 1 MG tablet TAKE 1/2 TO 1 TABLET BY MOUTH NIGHTLY AS NEEDED   metFORMIN (GLUCOPHAGE) 500 MG tablet Take 500 mg with breakfast and 1000 mg with dinner   montelukast (SINGULAIR) 10 MG tablet TAKE 1 TABLET BY MOUTH AT  BEDTIME   Multiple Vitamin (MULTIVITAMIN  WITH MINERALS) TABS tablet Take 1 tablet by mouth daily.   nebivolol (BYSTOLIC) 5 MG tablet TAKE 1 TABLET BY MOUTH DAILY   nitroGLYCERIN (NITROSTAT) 0.4 MG SL tablet Place 1 tablet (0.4 mg total) under the tongue every 5 (five) minutes x 3 doses as needed for chest pain.   Respiratory Therapy Supplies (FLUTTER) DEVI Twice a day and prn as needed, may increase if feeling worse   rosuvastatin (CRESTOR) 10 MG tablet TAKE 1 TABLET ON MONDAY, WEDNESDAY & FRIDAY.   SYMBICORT 160-4.5 MCG/ACT inhaler USE 1 TO 2 INHALATIONS BY  MOUTH EVERY 12 HOURS AS  NEEDED   vardenafil  (LEVITRA) 20 MG tablet TAKE 1 TABLET BY MOUTH AS DIRECTED. CAN NOT BE TAKEN WITH NITROGLYCERIN. NOT COVERED BY INSURANCE   zinc gluconate 50 MG tablet Take 50 mg by mouth daily.   [DISCONTINUED] Continuous Blood Gluc Sensor (FREESTYLE LIBRE 14 DAY SENSOR) MISC APPLY 1 SENSOR EVERY 14 DAYS   No facility-administered encounter medications on file as of 06/07/2023.    Allergies (verified) Contrast media [iodinated contrast media] and Iodine   History: Past Medical History:  Diagnosis Date   Arthritis    "thumbs" (07/25/2018)   Asthma    CHF (congestive heart failure) (HCC)    Chronic bronchitis (HCC)    Environmental allergies    GERD (gastroesophageal reflux disease)    Headache    "seasonal; w/environmental allergies" (07/25/2018)   History of blood transfusion    "when I had laminectomy" (07/25/2018)   HTN (hypertension)    Hyperlipidemia    LBBB (left bundle branch block) 1999   Metatarsal bone fracture 03/07/2018   Myocardial infarction [facility])    "was told I've had an old MI; probably in the 22s" (07/25/2018)   Pneumonia    "as a child, age 65; viral pneumonia 3 times in the last 10 years" (07/25/2018)   Presence of permanent cardiac pacemaker 07/25/2018   Seasonal allergies    Sleep apnea    "wife says I do" (07/25/2018)   Type II diabetes mellitus (HCC)    Past Surgical History:  Procedure Laterality Date   BACK SURGERY     BIV PACEMAKER INSERTION CRT-P  07/25/2018   BIV PACEMAKER INSERTION CRT-P N/A 07/25/2018   Procedure: BIV PACEMAKER INSERTION CRT-P;  Surgeon: Marinus Maw, MD;  Location: [facility];  Service: Cardiovascular;  Laterality: N/A;   CARDIAC CATHETERIZATION  2003   Dr Garnette Scheuermann; 85 % R circumflex obstruction   CARPAL TUNNEL RELEASE Left 10/11/2014   Procedure: LEFT CARPAL TUNNEL RELEASE;  Surgeon: Dominica Severin, MD;  Location: [facility];  Service: Orthopedics;  Laterality: Left;   COLONOSCOPY W/ BIOPSIES AND POLYPECTOMY  2018    INGUINAL HERNIA REPAIR Right 1948   INGUINAL HERNIA REPAIR Left 1988   LUMBAR LAMINECTOMY  1984   MINOR CARPAL TUNNEL Right 11/22/2014   Procedure: RIGHT LIMITED OPEN CARPAL TUNNEL RELEASE;  Surgeon: Dominica Severin, MD;  Location: [facility];  Service: Orthopedics;  Laterality: Right;   TONSILLECTOMY  1958   Family History  Problem Relation Age of Onset   Heart attack Father        35s   Colon polyps Father    Heart failure Mother        90s   Subarachnoid hemorrhage Mother 76   Hypertension Mother    Subarachnoid hemorrhage Paternal Grandfather 83   Diabetes Maternal Aunt    Colon cancer Neg Hx  Social History   Socioeconomic History   Marital status: Married    Spouse name: Not on file   Number of children: 3   Years of education: Not on file   Highest education level: Professional school degree (e.g., MD, DDS, DVM, JD)  Occupational History   Occupation: retired Scientist, forensic: [employer]  Tobacco Use   Smoking status: Former    Packs/day: 2.00    Years: 2.00    Additional pack years: 0.00    Total pack years: 4.00    Types: Cigarettes    Quit date: 12/14/1963    Years since quitting: 59.5   Smokeless tobacco: Never  Vaping Use   Vaping Use: Never used  Substance and Sexual Activity   Alcohol use: Not Currently    Alcohol/week: 2.0 standard drinks of alcohol    Types: 2 Glasses of wine per week    Comment: occasional   Drug use: Never   Sexual activity: Yes  Other Topics Concern   Not on file  Social History Narrative   Lives with wife in a 2 story home.  Has 3 daughters.  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     Social Determinants of Health   Financial Resource Strain: Low Risk  (06/07/2023)   Overall Financial Resource Strain (CARDIA)    Difficulty of Paying Living Expenses: Not hard at all  Food Insecurity: No Food Insecurity (06/07/2023)   Hunger Vital Sign    Worried About Running Out of Food in the Last Year: Never  true    Ran Out of Food in the Last Year: Never true  Transportation Needs: No Transportation Needs (06/07/2023)   PRAPARE - Administrator, Civil Service (Medical): No    Lack of Transportation (Non-Medical): No  Physical Activity: Sufficiently Active (06/07/2023)   Exercise Vital Sign    Days of Exercise per Week: 5 days    Minutes of Exercise per Session: 30 min  Stress: No Stress Concern Present (06/07/2023)   [employer] of Occupational Health - Occupational Stress Questionnaire    Feeling of Stress : Not at all  Social Connections: Socially Integrated (06/07/2023)   Social Connection and Isolation Panel [NHANES]    Frequency of Communication with Friends and Family: More than three times a week    Frequency of Social Gatherings with Friends and Family: More than three times a week    Attends Religious Services: More than 4 times per year    Active Member of [employer] or Organizations: Yes    Attends Engineer, structural: More than 4 times per year    Marital Status: Married    Tobacco Counseling Counseling given: Not Answered   Clinical Intake:  Pre-visit preparation completed: Yes  Pain : 0-10 Pain Score: 3  Pain Type: Chronic pain Pain Location: Generalized     BMI - recorded: 26.08 Nutritional Status: BMI 25 -29 Overweight Nutritional Risks: None Diabetes: Yes CBG done?: No Did pt. bring in CBG monitor from home?: No  How often do you need to have someone help you when you read instructions, pamphlets, or other written materials from your doctor or pharmacy?: 1 - Never What is the last grade level you completed in school?: Graduate School (Nurse Anesthetist)  Interpreter Needed?: No  Information entered by :: Susie Cassette, LPN.   Activities of Daily Living    06/07/2023    9:28 AM  In your present state of health, do you have any  difficulty performing the following activities:  Hearing? 0  Vision? 0  Difficulty concentrating or  making decisions? 0  Walking or climbing stairs? 0  Dressing or bathing? 0  Doing errands, shopping? 0  Preparing Food and eating ? N  Using the Toilet? N  In the past six months, have you accidently leaked urine? N  Do you have problems with loss of bowel control? N  Managing your Medications? N  Managing your Finances? N  Housekeeping or managing your Housekeeping? N    Patient Care Team: Pincus Sanes, MD as PCP - General (Internal Medicine) Lyn Records, MD (Inactive) as PCP - Cardiology (Cardiology) Marinus Maw, MD as PCP - Electrophysiology (Cardiology) [facility], [facility] (Neurosurgery) Kathyrn Sheriff, [facility] (Inactive) as Pharmacist (Pharmacist) Antony Contras, MD as Consulting Physician (Ophthalmology)  Indicate any recent Medical Services you may have received from other than [facility] providers in the past year (date may be approximate).     Assessment:   This is a routine wellness examination for Jeffery [name].  Hearing/Vision screen Hearing Screening - Comments:: Patient denies any hearing issues. Vision Screening - Comments:: Wears rx glasses - up to date with routine eye exams with Antony Contras, MD.   Dietary issues and exercise activities discussed:     Goals Addressed             This Visit's Progress    My healthcare goal for 2024 is to don't get sick or fall.        Depression Screen    06/07/2023    9:28 AM 12/31/2022    8:59 AM 05/20/2022    2:59 PM 03/23/2022   10:01 AM 05/14/2021    1:50 PM 03/27/2020    2:31 PM 08/01/2019    1:27 PM  PHQ 2/9 Scores  PHQ - 2 Score 0 0 0 0 0 0 0  PHQ- 9 Score 0          Fall Risk    06/07/2023    9:27 AM 12/31/2022    8:59 AM 05/20/2022    2:56 PM 03/23/2022   10:01 AM 05/14/2021    1:52 PM  Fall Risk   Falls in the past year? 0 0 0 0 0  Number falls in past yr: 0 0 0 0 0  Injury with Fall? 0 0 0 0 0  Risk for fall due to : No Fall Risks No Fall Risks No Fall Risks No Fall Risks No Fall  Risks  Follow up Falls prevention discussed Falls evaluation completed Falls evaluation completed Falls evaluation completed Falls evaluation completed    MEDICARE RISK AT HOME:  Medicare Risk at Home - 06/07/23 0928     Any stairs in or around the home? Yes    If so, are there any without handrails? No    Home free of loose throw rugs in walkways, pet beds, electrical cords, etc? Yes    Adequate lighting in your home to reduce risk of falls? Yes    Life alert? Yes   panic button   Use of a cane, walker or w/c? No    Grab bars in the bathroom? Yes    Shower chair or bench in shower? Yes    Elevated toilet seat or a handicapped toilet? No             TIMED UP AND GO:  Was the test performed?  No    Cognitive Function:  06/07/2023    9:28 AM 05/20/2022    3:05 PM 03/27/2020    2:32 PM  6CIT Screen  What Year? 0 points 0 points 0 points  What month? 0 points 0 points 0 points  What time? 0 points 0 points 0 points  Count back from 20 0 points 0 points 0 points  Months in reverse 0 points 0 points 0 points  Repeat phrase 0 points 0 points 0 points  Total Score 0 points 0 points 0 points    Immunizations Immunization History  Administered Date(s) Administered   Fluad Quad(high Dose 65+) 08/01/2019, 08/08/2021, 11/20/2022   Influenza Split 08/30/2012   Influenza, High Dose Seasonal PF 08/31/2017, 08/08/2020, 09/04/2021   Influenza,inj,Quad PF,6+ Mos 10/09/2013, 09/12/2015   Influenza,trivalent, recombinat, inj, PF 08/21/2018   Influenza-Unspecified 09/29/2014, 09/02/2017, 08/30/2019, 08/08/2020   Moderna Sars-Covid-2 Vaccination 09/12/2018, 01/25/2020, 03/03/2020   PFIZER Comirnaty(Gray Top)Covid-19 Tri-Sucrose Vaccine 05/26/2021   Pfizer Covid-19 Vaccine Bivalent Booster 84yrs & up 09/07/2021   Pneumococcal Conjugate-13 03/04/2016   Pneumococcal Polysaccharide-23 04/12/2017   Respiratory Syncytial Virus Vaccine,Recomb Aduvanted(Arexvy) 01/18/2023   Td  05/13/2008   Tdap 07/02/2020    TDAP status: Up to date  Flu Vaccine status: Up to date  Pneumococcal vaccine status: Up to date  Covid-19 vaccine status: Completed vaccines  Qualifies for Shingles Vaccine? Yes   Zostavax completed No   Shingrix Completed?: No.    Education has been provided regarding the importance of this vaccine. Patient has been advised to call insurance company to determine out of pocket expense if they have not yet received this vaccine. Advised may also receive vaccine at local pharmacy or Health Dept. Verbalized acceptance and understanding.  Screening Tests Health Maintenance  Topic Date Due   Zoster Vaccines- Shingrix (1 of 2) Never done   FOOT EXAM  04/18/2020   COVID-19 Vaccine (6 - 2023-24 season) 08/13/2022   HEMOGLOBIN A1C  07/01/2023   INFLUENZA VACCINE  07/14/2023   OPHTHALMOLOGY EXAM  12/28/2023   Diabetic kidney evaluation - eGFR measurement  01/01/2024   Diabetic kidney evaluation - Urine ACR  01/01/2024   Medicare Annual Wellness (AWV)  06/06/2024   DTaP/Tdap/Td (3 - Td or Tdap) 07/02/2030   Pneumonia Vaccine 51+ Years old  Completed   Hepatitis C Screening  Completed   HPV VACCINES  Aged Out   Colonoscopy  Discontinued    Health Maintenance  Health Maintenance Due  Topic Date Due   Zoster Vaccines- Shingrix (1 of 2) Never done   FOOT EXAM  04/18/2020   COVID-19 Vaccine (6 - 2023-24 season) 08/13/2022    Colorectal cancer screening: No longer required.   Lung Cancer Screening: (Low Dose CT Chest recommended if Age 10-80 years, 20 pack-year currently smoking OR have quit w/in 15years.) does not qualify.   Lung Cancer Screening Referral: no  Additional Screening:  Hepatitis C Screening: does qualify; Completed 04/11/2017  Vision Screening: Recommended annual ophthalmology exams for early detection of glaucoma and other disorders of the eye. Is the patient up to date with their annual eye exam?  Yes  Who is the provider or what  is the name of the office in which the patient attends annual eye exams? Antony Contras, MD. If pt is not established with a provider, would they like to be referred to a provider to establish care? No .   Dental Screening: Recommended annual dental exams for proper oral hygiene  Diabetic Foot Exam: Diabetic Foot Exam: Completed 04/19/2019; patient is overdue.  Community Resource Referral / Chronic Care Management: CRR required this visit?  No   CCM required this visit?  No     Plan:     I have personally reviewed and noted the following in the patient's chart:   Medical and social history Use of alcohol, tobacco or illicit drugs  Current medications and supplements including opioid prescriptions. Patient is currently taking opioid prescriptions. Information provided to patient regarding non-opioid alternatives. Patient advised to discuss non-opioid treatment plan with their provider. Functional ability and status Nutritional status Physical activity Advanced directives List of other physicians Hospitalizations, surgeries, and ER visits in previous 12 months Vitals Screenings to include cognitive, depression, and falls Referrals and appointments  In addition, I have reviewed and discussed with patient certain preventive protocols, quality metrics, and best practice recommendations. A written personalized care plan for preventive services as well as general preventive health recommendations were provided to patient.     Mickeal Needy, LPN   03/21/8118   After Visit Summary: (Mail) Due to this being a telephonic visit, the after visit summary with patients personalized plan was offered to patient via mail   Nurse Notes: Normal cognitive status assessed by direct observation via telephone conversation by this Nurse Health Advisor. No abnormalities found.

## 2023-06-07 NOTE — Patient Instructions (Addendum)
Jeffery [name] , Thank you for taking time to come for your Medicare Wellness Visit. I appreciate your ongoing commitment to your health goals. Please review the following plan we discussed and let me know if I can assist you in the future.   These are the goals we discussed:  Goals      My healthcare goal for 2024 is to don't get sick or fall.        This is a list of the screening recommended for you and due dates:  Health Maintenance  Topic Date Due   Zoster (Shingles) Vaccine (1 of 2) Never done   Complete foot exam   04/18/2020   COVID-19 Vaccine (6 - 2023-24 season) 08/13/2022   Hemoglobin A1C  07/01/2023   Flu Shot  07/14/2023   Eye exam for diabetics  12/28/2023   Yearly kidney function blood test for diabetes  01/01/2024   Yearly kidney health urinalysis for diabetes  01/01/2024   Medicare Annual Wellness Visit  06/06/2024   DTaP/Tdap/Td vaccine (3 - Td or Tdap) 07/02/2030   Pneumonia Vaccine  Completed   Hepatitis C Screening  Completed   HPV Vaccine  Aged Out   Colon Cancer Screening  Discontinued    Advanced directives: Yes  Conditions/risks identified: Yes  Next appointment: Please schedule your next Medicare Wellness Visit with your Nurse Health Advisor in 1 year by calling [phone].  Preventive Care 80 Years and Older, Male  Preventive care refers to lifestyle choices and visits with your health care provider that can promote health and wellness. What does preventive care include? A yearly physical exam. This is also called an annual well check. Dental exams once or twice a year. Routine eye exams. Ask your health care provider how often you should have your eyes checked. Personal lifestyle choices, including: Daily care of your teeth and gums. Regular physical activity. Eating a healthy diet. Avoiding tobacco and drug use. Limiting alcohol use. Practicing safe sex. Taking low doses of aspirin every day. Taking vitamin and mineral supplements as  recommended by your health care provider. What happens during an annual well check? The services and screenings done by your health care provider during your annual well check will depend on your age, overall health, lifestyle risk factors, and family history of disease. Counseling  Your health care provider may ask you questions about your: Alcohol use. Tobacco use. Drug use. Emotional well-being. Home and relationship well-being. Sexual activity. Eating habits. History of falls. Memory and ability to understand (cognition). Work and work Astronomer. Screening  You may have the following tests or measurements: Height, weight, and BMI. Blood pressure. Lipid and cholesterol levels. These may be checked every 5 years, or more frequently if you are over 67 years old. Skin check. Lung cancer screening. You may have this screening every year starting at age 34 if you have a 30-pack-year history of smoking and currently smoke or have quit within the past 15 years. Fecal occult blood test (FOBT) of the stool. You may have this test every year starting at age 75. Flexible sigmoidoscopy or colonoscopy. You may have a sigmoidoscopy every 5 years or a colonoscopy every 10 years starting at age 38. Prostate cancer screening. Recommendations will vary depending on your family history and other risks. Hepatitis C blood test. Hepatitis B blood test. Sexually transmitted disease (STD) testing. Diabetes screening. This is done by checking your blood sugar (glucose) after you have not eaten for a while (fasting). You may have this  done every 1-3 years. Abdominal aortic aneurysm (AAA) screening. You may need this if you are a current or former smoker. Osteoporosis. You may be screened starting at age 21 if you are at high risk. Talk with your health care provider about your test results, treatment options, and if necessary, the need for more tests. Vaccines  Your health care provider may recommend  certain vaccines, such as: Influenza vaccine. This is recommended every year. Tetanus, diphtheria, and acellular pertussis (Tdap, Td) vaccine. You may need a Td booster every 10 years. Zoster vaccine. You may need this after age 11. Pneumococcal 13-valent conjugate (PCV13) vaccine. One dose is recommended after age 40. Pneumococcal polysaccharide (PPSV23) vaccine. One dose is recommended after age 78. Talk to your health care provider about which screenings and vaccines you need and how often you need them. This information is not intended to replace advice given to you by your health care provider. Make sure you discuss any questions you have with your health care provider. Document Released: 12/26/2015 Document Revised: 08/18/2016 Document Reviewed: 09/30/2015 Elsevier Interactive Patient Education  2017 [place] Prevention in the Home Falls can cause injuries. They can happen to people of all ages. There are many things you can do to make your home safe and to help prevent falls. What can I do on the outside of my home? Regularly fix the edges of walkways and driveways and fix any cracks. Remove anything that might make you trip as you walk through a door, such as a raised step or threshold. Trim any bushes or trees on the path to your home. Use bright outdoor lighting. Clear any walking paths of anything that might make someone trip, such as rocks or tools. Regularly check to see if handrails are loose or broken. Make sure that both sides of any steps have handrails. Any raised decks and porches should have guardrails on the edges. Have any leaves, snow, or ice cleared regularly. Use sand or salt on walking paths during winter. Clean up any spills in your garage right away. This includes oil or grease spills. What can I do in the bathroom? Use night lights. Install grab bars by the toilet and in the tub and shower. Do not use towel bars as grab bars. Use non-skid mats or  decals in the tub or shower. If you need to sit down in the shower, use a plastic, non-slip stool. Keep the floor dry. Clean up any water that spills on the floor as soon as it happens. Remove soap buildup in the tub or shower regularly. Attach bath mats securely with double-sided non-slip rug tape. Do not have throw rugs and other things on the floor that can make you trip. What can I do in the bedroom? Use night lights. Make sure that you have a light by your bed that is easy to reach. Do not use any sheets or blankets that are too big for your bed. They should not hang down onto the floor. Have a firm chair that has side arms. You can use this for support while you get dressed. Do not have throw rugs and other things on the floor that can make you trip. What can I do in the kitchen? Clean up any spills right away. Avoid walking on wet floors. Keep items that you use a lot in easy-to-reach places. If you need to reach something above you, use a strong step stool that has a grab bar. Keep electrical cords out of  the way. Do not use floor polish or wax that makes floors slippery. If you must use wax, use non-skid floor wax. Do not have throw rugs and other things on the floor that can make you trip. What can I do with my stairs? Do not leave any items on the stairs. Make sure that there are handrails on both sides of the stairs and use them. Fix handrails that are broken or loose. Make sure that handrails are as long as the stairways. Check any carpeting to make sure that it is firmly attached to the stairs. Fix any carpet that is loose or worn. Avoid having throw rugs at the top or bottom of the stairs. If you do have throw rugs, attach them to the floor with carpet tape. Make sure that you have a light switch at the top of the stairs and the bottom of the stairs. If you do not have them, ask someone to add them for you. What else can I do to help prevent falls? Wear shoes that: Do not  have high heels. Have rubber bottoms. Are comfortable and fit you well. Are closed at the toe. Do not wear sandals. If you use a stepladder: Make sure that it is fully opened. Do not climb a closed stepladder. Make sure that both sides of the stepladder are locked into place. Ask someone to hold it for you, if possible. Clearly mark and make sure that you can see: Any grab bars or handrails. First and last steps. Where the edge of each step is. Use tools that help you move around (mobility aids) if they are needed. These include: Canes. Walkers. Scooters. Crutches. Turn on the lights when you go into a dark area. Replace any light bulbs as soon as they burn out. Set up your furniture so you have a clear path. Avoid moving your furniture around. If any of your floors are uneven, fix them. If there are any pets around you, be aware of where they are. Review your medicines with your doctor. Some medicines can make you feel dizzy. This can increase your chance of falling. Ask your doctor what other things that you can do to help prevent falls. This information is not intended to replace advice given to you by your health care provider. Make sure you discuss any questions you have with your health care provider. Document Released: 09/25/2009 Document Revised: 05/06/2016 Document Reviewed: 01/03/2015 Elsevier Interactive Patient Education  2017 [employer].

## 2023-06-09 ENCOUNTER — Other Ambulatory Visit: Payer: Self-pay

## 2023-06-09 MED ORDER — DAPAGLIFLOZIN PROPANEDIOL 10 MG PO TABS: 10.0000 mg | ORAL_TABLET | Freq: Every day | ORAL | 2 refills | Status: DC

## 2023-06-10 ENCOUNTER — Telehealth: Payer: Self-pay | Admitting: Pharmacy Technician

## 2023-06-10 NOTE — Telephone Encounter (Signed)
Pharmacy Patient Advocate Encounter   Received notification from CoverMyMeds that prior authorization for FreeStyle Libre 14 Day Sensor is required/requested.    PA submitted to [employer] via CoverMyMeds Key/confirmation #/EOC [facility] Status is pending

## 2023-06-18 ENCOUNTER — Other Ambulatory Visit: Payer: Self-pay | Admitting: Internal Medicine

## 2023-06-24 ENCOUNTER — Other Ambulatory Visit: Payer: Self-pay | Admitting: Internal Medicine

## 2023-06-24 ENCOUNTER — Other Ambulatory Visit: Payer: Self-pay | Admitting: Family Medicine

## 2023-06-24 NOTE — Telephone Encounter (Signed)
MD is out of the office until 7/29 forwarding refill to DOD...Raechel Chute

## 2023-06-29 ENCOUNTER — Other Ambulatory Visit: Payer: Self-pay | Admitting: Internal Medicine

## 2023-06-29 MED ORDER — HYDROCODONE-ACETAMINOPHEN 7.5-325 MG PO TABS: 1.0000 | ORAL_TABLET | Freq: Four times a day (QID) | ORAL | 0 refills | Status: DC | PRN

## 2023-06-29 NOTE — Telephone Encounter (Signed)
Called pt he states he take the hydrocodone every 6 hours. He did take 1-2 on one day when pain was worse./lmb

## 2023-06-29 NOTE — Telephone Encounter (Signed)
Covering for Dr. Lawerance Bach

## 2023-07-08 NOTE — Telephone Encounter (Signed)
Pharmacy Patient Advocate Encounter  Received notification from [facility] that Prior Authorization for [facility] has been DENIED. Please advise how you'd like to proceed. Full denial letter will be uploaded to the media tab. See denial reason below.  PA #/Case ID/Reference #: [id number]

## 2023-07-19 ENCOUNTER — Other Ambulatory Visit: Payer: Self-pay | Admitting: Internal Medicine

## 2023-07-28 ENCOUNTER — Encounter: Payer: Self-pay | Admitting: Internal Medicine

## 2023-07-29 ENCOUNTER — Ambulatory Visit (INDEPENDENT_AMBULATORY_CARE_PROVIDER_SITE_OTHER): Payer: Medicare Other

## 2023-07-29 DIAGNOSIS — I441 Atrioventricular block, second degree: Secondary | ICD-10-CM | POA: Diagnosis not present

## 2023-07-29 LAB — CUP PACEART REMOTE DEVICE CHECK
Battery Remaining Longevity: 32 mo
Battery Remaining Percentage: 37 %
Battery Voltage: 2.95 V
Brady Statistic AP VP Percent: 38 %
Brady Statistic AP VS Percent: 1 %
Brady Statistic AS VP Percent: 61 %
Brady Statistic AS VS Percent: 1 %
Brady Statistic RA Percent Paced: 38 %
Date Time Interrogation Session: 20240816030848
Implantable Lead Connection Status: 753985
Implantable Lead Connection Status: 753985
Implantable Lead Connection Status: 753985
Implantable Lead Implant Date: 20190813
Implantable Lead Implant Date: 20190813
Implantable Lead Implant Date: 20190813
Implantable Lead Location: 753858
Implantable Lead Location: 753859
Implantable Lead Location: 753860
Implantable Pulse Generator Implant Date: 20190813
Lead Channel Impedance Value: 430 Ohm
Lead Channel Impedance Value: 450 Ohm
Lead Channel Impedance Value: 650 Ohm
Lead Channel Pacing Threshold Amplitude: 0.75 V
Lead Channel Pacing Threshold Amplitude: 0.75 V
Lead Channel Pacing Threshold Amplitude: 1.125 V
Lead Channel Pacing Threshold Pulse Width: 0.5 ms
Lead Channel Pacing Threshold Pulse Width: 0.5 ms
Lead Channel Pacing Threshold Pulse Width: 0.5 ms
Lead Channel Sensing Intrinsic Amplitude: 12 mV
Lead Channel Sensing Intrinsic Amplitude: 2 mV
Lead Channel Setting Pacing Amplitude: 1.75 V
Lead Channel Setting Pacing Amplitude: 2.125
Lead Channel Setting Pacing Amplitude: 2.5 V
Lead Channel Setting Pacing Pulse Width: 0.5 ms
Lead Channel Setting Pacing Pulse Width: 0.5 ms
Lead Channel Setting Sensing Sensitivity: 5 mV
Pulse Gen Model: 3562
Pulse Gen Serial Number: 9043864

## 2023-07-30 ENCOUNTER — Other Ambulatory Visit: Payer: Self-pay | Admitting: Internal Medicine

## 2023-08-01 MED ORDER — HYDROCODONE-ACETAMINOPHEN 7.5-325 MG PO TABS: 1.0000 | ORAL_TABLET | Freq: Four times a day (QID) | ORAL | 0 refills | Status: DC | PRN

## 2023-08-01 NOTE — Telephone Encounter (Signed)
Rx sent to p[facility] -- he is due for routine f/u - pls ask him to schedule an appt

## 2023-08-04 ENCOUNTER — Other Ambulatory Visit: Payer: Self-pay

## 2023-08-04 MED ORDER — HYDROCODONE-ACETAMINOPHEN 7.5-325 MG PO TABS
1.0000 | ORAL_TABLET | Freq: Four times a day (QID) | ORAL | 0 refills | Status: DC | PRN
  Filled 2023-08-04 – 2023-09-01 (×2): qty 90, 23d supply, fill #0

## 2023-08-05 ENCOUNTER — Other Ambulatory Visit: Payer: Self-pay

## 2023-08-08 NOTE — Progress Notes (Signed)
Remote pacemaker transmission.   

## 2023-08-09 ENCOUNTER — Other Ambulatory Visit: Payer: Self-pay

## 2023-08-16 ENCOUNTER — Other Ambulatory Visit: Payer: Self-pay | Admitting: Internal Medicine

## 2023-08-16 ENCOUNTER — Encounter: Payer: Self-pay | Admitting: Internal Medicine

## 2023-08-16 ENCOUNTER — Other Ambulatory Visit: Payer: Self-pay

## 2023-08-16 ENCOUNTER — Ambulatory Visit: Payer: Medicare Other | Attending: Internal Medicine | Admitting: Internal Medicine

## 2023-08-16 VITALS — BP 118/82 | HR 64 | Ht 69.5 in | Wt 181.8 lb

## 2023-08-16 DIAGNOSIS — I5042 Chronic combined systolic (congestive) and diastolic (congestive) heart failure: Secondary | ICD-10-CM

## 2023-08-16 DIAGNOSIS — Z950 Presence of cardiac pacemaker: Secondary | ICD-10-CM

## 2023-08-16 LAB — CUP PACEART INCLINIC DEVICE CHECK
Battery Remaining Longevity: 31 mo
Battery Voltage: 2.95 V
Brady Statistic RA Percent Paced: 38 %
Brady Statistic RV Percent Paced: 99.34 %
Date Time Interrogation Session: 20240903164755
Implantable Lead Connection Status: 753985
Implantable Lead Connection Status: 753985
Implantable Lead Connection Status: 753985
Implantable Lead Implant Date: 20190813
Implantable Lead Implant Date: 20190813
Implantable Lead Implant Date: 20190813
Implantable Lead Location: 753858
Implantable Lead Location: 753859
Implantable Lead Location: 753860
Implantable Pulse Generator Implant Date: 20190813
Lead Channel Impedance Value: 400 Ohm
Lead Channel Impedance Value: 437.5 Ohm
Lead Channel Impedance Value: 662.5 Ohm
Lead Channel Pacing Threshold Amplitude: 0.5 V
Lead Channel Pacing Threshold Amplitude: 0.5 V
Lead Channel Pacing Threshold Amplitude: 0.75 V
Lead Channel Pacing Threshold Amplitude: 0.75 V
Lead Channel Pacing Threshold Amplitude: 1.25 V
Lead Channel Pacing Threshold Amplitude: 1.25 V
Lead Channel Pacing Threshold Pulse Width: 0.5 ms
Lead Channel Pacing Threshold Pulse Width: 0.5 ms
Lead Channel Pacing Threshold Pulse Width: 0.5 ms
Lead Channel Pacing Threshold Pulse Width: 0.5 ms
Lead Channel Pacing Threshold Pulse Width: 0.5 ms
Lead Channel Pacing Threshold Pulse Width: 0.5 ms
Lead Channel Sensing Intrinsic Amplitude: 1.7 mV
Lead Channel Sensing Intrinsic Amplitude: 10 mV
Lead Channel Setting Pacing Amplitude: 1.625
Lead Channel Setting Pacing Amplitude: 2 V
Lead Channel Setting Pacing Amplitude: 2.5 V
Lead Channel Setting Pacing Pulse Width: 0.5 ms
Lead Channel Setting Pacing Pulse Width: 0.5 ms
Lead Channel Setting Sensing Sensitivity: 5 mV
Pulse Gen Model: 3562
Pulse Gen Serial Number: 9043864

## 2023-08-16 MED ORDER — ROSUVASTATIN CALCIUM 10 MG PO TABS: ORAL_TABLET | ORAL | 1 refills | Status: DC

## 2023-08-16 NOTE — Patient Instructions (Signed)

## 2023-08-16 NOTE — Progress Notes (Signed)
HPI [name] returns today for followup. 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 He has done well in the interim. No recurrent syncope. No chest pain or sob. He is helping to care for his grandchildren. He notes occaisional palpitations which appear to be due mostly to PMT. Allergies  Allergen Reactions   Contrast Media [Iodinated Contrast Media] Other (See Comments)    Redness and warm sensation, this reaction was noted on 02/27/13 during a Cardiac MRI per pt.  Pt sts he had erythema on his head, chest, and shoulders.  Pt had a pacemaker placed August 2019 and was pre-medicated for the dye and had no allergic reaction at that time. -Carissa Mozingo B.S. RT(R)(CT)   Iodine      Current Outpatient Medications  Medication Sig Dispense Refill   ACCU-CHEK AVIVA PLUS test strip USE AS INSTRUCTED TO TEST BLOOD SUGAR 6 TIMES DAILY. DX CODE: E11.59 500 strip 4   ACCU-CHEK SOFTCLIX LANCETS lancets Use to test blood sugar daily as directed. 100 each 3   albuterol (PROVENTIL) (2.5 MG/3ML) 0.083% nebulizer solution TAKE 3 MLS BY NEBULIZATION EVERY 6 (SIX) HOURS AS NEEDED FOR WHEEZING OR SHORTNESS OF BREATH. 150 mL 5   albuterol (VENTOLIN HFA) 108 (90 Base) MCG/ACT inhaler TAKE 2 PUFFS BY MOUTH EVERY 6 HOURS AS NEEDED FOR WHEEZE OR SHORTNESS OF BREATH 6.7 each 11   aspirin 81 MG tablet Take 81 mg by mouth at bedtime.     celecoxib (CELEBREX) 100 MG capsule TAKE 1 CAPSULE BY MOUTH EVERY DAY 90 capsule 1   Continuous Blood Gluc Receiver (FREESTYLE LIBRE READER) DEVI 1 Device by Does not apply route every 14 (fourteen) days. Freestyle libre 3 reader 1 each 0   Continuous Glucose Sensor (FREESTYLE LIBRE 14 DAY SENSOR) MISC APPLY 1 SENSOR EVERY 14 DAYS 2 each 2   dapagliflozin propanediol (FARXIGA) 10 MG TABS tablet Take 1 tablet (10 mg total) by mouth daily before breakfast. 90 tablet 2   fexofenadine (ALLEGRA) 180 MG  tablet Take 180 mg by mouth daily as needed for allergies.      gabapentin (NEURONTIN) 100 MG capsule TAKE 2 CAPSULES BY MOUTH 3 TIMES DAILY 540 capsule 3   HYDROcodone-acetaminophen (NORCO) 7.5-325 MG tablet Take 1 tablet by mouth every 6 (six) hours as needed for severe pain (for chronic joint pain, neck pain). 90 tablet 0   LORazepam (ATIVAN) 1 MG tablet TAKE 1/2 TO 1 TABLET BY MOUTH NIGHTLY AS NEEDED.  DUE FOR FOLLOWUP 30 tablet 0   metFORMIN (GLUCOPHAGE) 500 MG tablet TAKE 1 TABLET BY MOUTH TWICE A DAY WITH FOOD 180 tablet 0   montelukast (SINGULAIR) 10 MG tablet TAKE 1 TABLET BY MOUTH AT  BEDTIME 90 tablet 3   Multiple Vitamin (MULTIVITAMIN WITH MINERALS) TABS tablet Take 1 tablet by mouth daily.     nebivolol (BYSTOLIC) 5 MG tablet TAKE 1 TABLET BY MOUTH DAILY 90 tablet 1   nitroGLYCERIN (NITROSTAT) 0.4 MG SL tablet Place 1 tablet (0.4 mg total) under the tongue every 5 (five) minutes x 3 doses as needed for chest pain. 75 tablet 1   Respiratory Therapy Supplies (FLUTTER) DEVI Twice a day and prn as needed, may increase if feeling worse 1 each 0   rosuvastatin (CRESTOR) 10 MG tablet TAKE 1 TABLET ON MONDAY, WEDNESDAY & FRIDAY. 45 tablet 1   SYMBICORT 160-4.5 MCG/ACT inhaler USE 1  TO 2 INHALATIONS BY  MOUTH EVERY 12 HOURS AS  NEEDED 30.6 g 3   vardenafil (LEVITRA) 20 MG tablet TAKE 1 TABLET BY MOUTH AS DIRECTED. CAN NOT BE TAKEN WITH NITROGLYCERIN. NOT COVERED BY INSURANCE 6 tablet 2   zinc gluconate 50 MG tablet Take 50 mg by mouth daily.     famotidine (PEPCID) 20 MG tablet Take 1 tablet (20 mg total) by mouth daily. 30 tablet 0   No current facility-administered medications for this visit.     Past Medical History:  Diagnosis Date   Arthritis    "thumbs" (07/25/2018)   Asthma    CHF (congestive heart failure) (HCC)    Chronic bronchitis (HCC)    Environmental allergies    GERD (gastroesophageal reflux disease)    Headache    "seasonal; w/environmental allergies" (07/25/2018)    History of blood transfusion    "when I had laminectomy" (07/25/2018)   HTN (hypertension)    Hyperlipidemia    LBBB (left bundle branch block) 1999   Metatarsal bone fracture 03/07/2018   Myocardial infarction [facility])    "was told I've had an old MI; probably in the 1990s" (07/25/2018)   Pneumonia    "as a child, age 65; viral pneumonia 3 times in the last 10 years" (07/25/2018)   Presence of permanent cardiac pacemaker 07/25/2018   Seasonal allergies    Sleep apnea    "wife says I do" (07/25/2018)   Type II diabetes mellitus (HCC)     ROS:   All systems reviewed and negative except as noted in the HPI.   Past Surgical History:  Procedure Laterality Date   BACK SURGERY     BIV PACEMAKER INSERTION CRT-P  07/25/2018   BIV PACEMAKER INSERTION CRT-P N/A 07/25/2018   Procedure: BIV PACEMAKER INSERTION CRT-P;  Surgeon: Marinus Maw, MD;  Location: [facility] INVASIVE CV LAB;  Service: Cardiovascular;  Laterality: N/A;   CARDIAC CATHETERIZATION  2003   Dr Garnette Scheuermann; 85 % R circumflex obstruction   CARPAL TUNNEL RELEASE Left 10/11/2014   Procedure: LEFT CARPAL TUNNEL RELEASE;  Surgeon: Dominica Severin, MD;  Location: [facility];  Service: Orthopedics;  Laterality: Left;   COLONOSCOPY W/ BIOPSIES AND POLYPECTOMY  2018   INGUINAL HERNIA REPAIR Right 1948   INGUINAL HERNIA REPAIR Left 1988   LUMBAR LAMINECTOMY  1984   MINOR CARPAL TUNNEL Right 11/22/2014   Procedure: RIGHT LIMITED OPEN CARPAL TUNNEL RELEASE;  Surgeon: Dominica Severin, MD;  Location: [facility];  Service: Orthopedics;  Laterality: Right;   TONSILLECTOMY  1958     Family History  Problem Relation Age of Onset   Heart attack Father        19s   Colon polyps Father    Heart failure Mother        90s   Subarachnoid hemorrhage Mother 32   Hypertension Mother    Subarachnoid hemorrhage Paternal Grandfather 66   Diabetes Maternal Aunt    Colon cancer Neg Hx      Social History   Socioeconomic  History   Marital status: Married    Spouse name: Not on file   Number of children: 3   Years of education: Not on file   Highest education level: Professional school degree (e.g., MD, DDS, DVM, JD)  Occupational History   Occupation: retired Scientist, forensic: [place]  Tobacco Use   Smoking status: Former    Current packs/day: 0.00  Average packs/day: 2.0 packs/day for 2.0 years (4.0 ttl pk-yrs)    Types: Cigarettes    Start date: 12/13/1961    Quit date: 12/14/1963    Years since quitting: 59.7   Smokeless tobacco: Never  Vaping Use   Vaping status: Never Used  Substance and Sexual Activity   Alcohol use: Not Currently    Alcohol/week: 2.0 standard drinks of alcohol    Types: 2 Glasses of wine per week    Comment: occasional   Drug use: Never   Sexual activity: Yes  Other Topics Concern   Not on file  Social History Narrative   Lives with wife in a 2 story home.  Has 3 daughters.  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     Social Determinants of Health   Financial Resource Strain: Low Risk  (06/07/2023)   Overall Financial Resource Strain (CARDIA)    Difficulty of Paying Living Expenses: Not hard at all  Food Insecurity: No Food Insecurity (06/07/2023)   Hunger Vital Sign    Worried About Running Out of Food in the Last Year: Never true    Ran Out of Food in the Last Year: Never true  Transportation Needs: No Transportation Needs (06/07/2023)   PRAPARE - Administrator, Civil Service (Medical): No    Lack of Transportation (Non-Medical): No  Physical Activity: Sufficiently Active (06/07/2023)   Exercise Vital Sign    Days of Exercise per Week: 5 days    Minutes of Exercise per Session: 30 min  Stress: No Stress Concern Present (06/07/2023)   [employer] of Occupational Health - Occupational Stress Questionnaire    Feeling of Stress : Not at all  Social Connections: Socially Integrated (06/07/2023)   Social Connection and Isolation Panel  [NHANES]    Frequency of Communication with Friends and Family: More than three times a week    Frequency of Social Gatherings with Friends and Family: More than three times a week    Attends Religious Services: More than 4 times per year    Active Member of [employer] or Organizations: Yes    Attends Engineer, structural: More than 4 times per year    Marital Status: Married  Catering manager Violence: Not At Risk (06/07/2023)   Humiliation, Afraid, Rape, and Kick questionnaire    Fear of Current or Ex-Partner: No    Emotionally Abused: No    Physically Abused: No    Sexually Abused: No     BP 118/82   Pulse 64   Ht 5' 9.5" (1.765 m)   Wt 181 lb 12.8 oz (82.5 kg)   SpO2 98%   BMI 26.46 kg/m   Physical Exam:  Well appearing NAD HEENT: Unremarkable Neck:  No JVD, no thyromegally Lymphatics:  No adenopathy Back:  No CVA tenderness Lungs:  Clear with no wheezes HEART:  Regular rate rhythm, no murmurs, no rubs, no clicks Abd:  soft, positive bowel sounds, no organomegally, no rebound, no guarding Ext:  2 plus pulses, no edema, no cyanosis, no clubbing Skin:  No rashes no nodules Neuro:  CN II through XII intact, motor grossly intact  EKG -  P synch biv pacing  DEVICE  Normal device function.  See PaceArt for details.   Assess/Plan: Stokes Adams attacks - resolved with biv pacing Chronic systolic heart failure - his symptoms are class 2. He will continue his current meds. PPM - his [employer] biv PPM is working normally.

## 2023-08-22 ENCOUNTER — Ambulatory Visit: Payer: Medicare Other | Admitting: Podiatry

## 2023-08-22 ENCOUNTER — Ambulatory Visit (INDEPENDENT_AMBULATORY_CARE_PROVIDER_SITE_OTHER): Payer: Medicare Other

## 2023-08-22 ENCOUNTER — Encounter: Payer: Self-pay | Admitting: Podiatry

## 2023-08-22 ENCOUNTER — Encounter: Payer: Self-pay | Admitting: Internal Medicine

## 2023-08-22 DIAGNOSIS — M778 Other enthesopathies, not elsewhere classified: Secondary | ICD-10-CM

## 2023-08-22 DIAGNOSIS — M779 Enthesopathy, unspecified: Secondary | ICD-10-CM

## 2023-08-22 DIAGNOSIS — M7752 Other enthesopathy of left foot: Secondary | ICD-10-CM

## 2023-08-22 MED ORDER — TRIAMCINOLONE ACETONIDE 10 MG/ML IJ SUSP
10.0000 mg | Freq: Once | INTRAMUSCULAR | Status: AC
Start: 2023-08-22 — End: 2023-08-22
  Administered 2023-08-22: 10 mg via INTRA_ARTICULAR

## 2023-08-22 NOTE — Progress Notes (Unsigned)
Subjective:    Patient ID: Jeffery [name], male    DOB: 1941-10-19, 82 y.o.   MRN: [id number]     HPI Amritpal is here for follow up of his chronic medical problems.  5 days of cold symptoms - started with flu and covid vaccines.  Doing rescue inhaler q 6 hrs. symptoms are getting a little bit better.  Everything is in his chest.  No other cold symptoms.  Medications and allergies reviewed with patient and updated if appropriate.  Current Outpatient Medications on File Prior to Visit  Medication Sig Dispense Refill   ACCU-CHEK AVIVA PLUS test strip USE AS INSTRUCTED TO TEST BLOOD SUGAR 6 TIMES DAILY. DX CODE: E11.59 500 strip 4   ACCU-CHEK SOFTCLIX LANCETS lancets Use to test blood sugar daily as directed. 100 each 3   albuterol (PROVENTIL) (2.5 MG/3ML) 0.083% nebulizer solution TAKE 3 MLS BY NEBULIZATION EVERY 6 (SIX) HOURS AS NEEDED FOR WHEEZING OR SHORTNESS OF BREATH. 150 mL 5   albuterol (VENTOLIN HFA) 108 (90 Base) MCG/ACT inhaler TAKE 2 PUFFS BY MOUTH EVERY 6 HOURS AS NEEDED FOR WHEEZE OR SHORTNESS OF BREATH 6.7 each 11   aspirin 81 MG tablet Take 81 mg by mouth at bedtime.     celecoxib (CELEBREX) 100 MG capsule TAKE 1 CAPSULE BY MOUTH EVERY DAY 90 capsule 1   Continuous Blood Gluc Receiver (FREESTYLE LIBRE READER) DEVI 1 Device by Does not apply route every 14 (fourteen) days. Freestyle libre 3 reader 1 each 0   Continuous Glucose Sensor (FREESTYLE LIBRE 14 DAY SENSOR) MISC APPLY 1 SENSOR EVERY 14 DAYS 2 each 2   dapagliflozin propanediol (FARXIGA) 10 MG TABS tablet Take 1 tablet (10 mg total) by mouth daily before breakfast. 90 tablet 2   fexofenadine (ALLEGRA) 180 MG tablet Take 180 mg by mouth daily as needed for allergies.      gabapentin (NEURONTIN) 100 MG capsule TAKE 2 CAPSULES BY MOUTH 3 TIMES DAILY 540 capsule 3   HYDROcodone-acetaminophen (NORCO) 7.5-325 MG tablet Take 1 tablet by mouth every 6 (six) hours as needed for severe pain (for chronic joint pain, neck  pain). 90 tablet 0   LORazepam (ATIVAN) 1 MG tablet TAKE 1/2 TO 1 TABLET BY MOUTH NIGHTLY AS NEEDED. DUE FOR FOLLOWUP 30 tablet 0   metFORMIN (GLUCOPHAGE) 500 MG tablet TAKE 1 TABLET BY MOUTH TWICE A DAY WITH FOOD 180 tablet 0   montelukast (SINGULAIR) 10 MG tablet TAKE 1 TABLET BY MOUTH AT  BEDTIME 90 tablet 3   Multiple Vitamin (MULTIVITAMIN WITH MINERALS) TABS tablet Take 1 tablet by mouth daily.     nitroGLYCERIN (NITROSTAT) 0.4 MG SL tablet Place 1 tablet (0.4 mg total) under the tongue every 5 (five) minutes x 3 doses as needed for chest pain. 75 tablet 1   Respiratory Therapy Supplies (FLUTTER) DEVI Twice a day and prn as needed, may increase if feeling worse 1 each 0   rosuvastatin (CRESTOR) 10 MG tablet TAKE 1 TABLET ON MONDAY, WEDNESDAY & FRIDAY. 45 tablet 1   SYMBICORT 160-4.5 MCG/ACT inhaler USE 1 TO 2 INHALATIONS BY  MOUTH EVERY 12 HOURS AS  NEEDED 30.6 g 3   vardenafil (LEVITRA) 20 MG tablet TAKE 1 TABLET BY MOUTH AS DIRECTED. CAN NOT BE TAKEN WITH NITROGLYCERIN. NOT COVERED BY INSURANCE 6 tablet 2   zinc gluconate 50 MG tablet Take 50 mg by mouth daily.     famotidine (PEPCID) 20 MG tablet Take 1 tablet (20  mg total) by mouth daily. 30 tablet 0   No current facility-administered medications on file prior to visit.     Review of Systems  Constitutional:  Negative for fever.  HENT:  Negative for congestion, sinus pain and sore throat.   Respiratory:  Positive for cough (productive), chest tightness and wheezing (occ with laying down). Negative for shortness of breath.   Cardiovascular:  Negative for chest pain, palpitations and leg swelling.  Neurological:  Negative for light-headedness and headaches.       Objective:   Vitals:   08/23/23 0901  BP: 120/74  Pulse: 65  Temp: 98.1 F (36.7 C)  SpO2: 98%   BP Readings from Last 3 Encounters:  08/23/23 120/74  08/16/23 118/82  01/26/23 126/80   Wt Readings from Last 3 Encounters:  08/23/23 179 lb 12.8 oz (81.6 kg)   08/16/23 181 lb 12.8 oz (82.5 kg)  06/07/23 179 lb 3.2 oz (81.3 kg)   Body mass index is 26.17 kg/m.    Physical Exam Constitutional:      General: He is not in acute distress.    Appearance: Normal appearance. He is not ill-appearing.  HENT:     Head: Normocephalic and atraumatic.  Eyes:     Conjunctiva/sclera: Conjunctivae normal.  Cardiovascular:     Rate and Rhythm: Normal rate and regular rhythm.     Heart sounds: Normal heart sounds.  Pulmonary:     Effort: Pulmonary effort is normal. No respiratory distress.     Breath sounds: Wheezing (End expiratory wheeze-mild) present. No rales.  Musculoskeletal:     Right lower leg: No edema.     Left lower leg: No edema.  Skin:    General: Skin is warm and dry.     Findings: No rash.  Neurological:     Mental Status: He is alert. Mental status is at baseline.  Psychiatric:        Mood and Affect: Mood normal.        Lab Results  Component Value Date   WBC 6.3 12/31/2022   HGB 14.2 12/31/2022   HCT 43.4 12/31/2022   PLT 240.0 12/31/2022   GLUCOSE 107 (H) 12/31/2022   CHOL 115 12/31/2022   TRIG 88.0 12/31/2022   HDL 40.70 12/31/2022   LDLCALC 57 12/31/2022   ALT 19 12/31/2022   AST 17 12/31/2022   NA 142 12/31/2022   K 5.1 12/31/2022   CL 104 12/31/2022   CREATININE 1.10 12/31/2022   BUN 20 12/31/2022   CO2 31 12/31/2022   TSH 1.64 02/16/2021   PSA 0.79 08/31/2012   HGBA1C 7.0 (H) 12/31/2022   MICROALBUR <0.7 12/31/2022     Assessment & Plan:    See Problem List for Assessment and Plan of chronic medical problems.

## 2023-08-22 NOTE — Patient Instructions (Addendum)
      Blood work was ordered.   The lab is on the first floor.    Medications changes include :   none    A referral was ordered and someone will call you to schedule an appointment.     Return in about 6 months (around 02/20/2024) for Physical Exam.

## 2023-08-23 ENCOUNTER — Other Ambulatory Visit: Payer: Self-pay | Admitting: *Deleted

## 2023-08-23 ENCOUNTER — Ambulatory Visit (INDEPENDENT_AMBULATORY_CARE_PROVIDER_SITE_OTHER): Payer: Medicare Other | Admitting: Internal Medicine

## 2023-08-23 VITALS — BP 120/74 | HR 65 | Temp 98.1°F | Ht 69.5 in | Wt 179.8 lb

## 2023-08-23 DIAGNOSIS — I10 Essential (primary) hypertension: Secondary | ICD-10-CM

## 2023-08-23 DIAGNOSIS — I700 Atherosclerosis of aorta: Secondary | ICD-10-CM

## 2023-08-23 DIAGNOSIS — G479 Sleep disorder, unspecified: Secondary | ICD-10-CM | POA: Diagnosis not present

## 2023-08-23 DIAGNOSIS — E782 Mixed hyperlipidemia: Secondary | ICD-10-CM

## 2023-08-23 DIAGNOSIS — E1159 Type 2 diabetes mellitus with other circulatory complications: Secondary | ICD-10-CM

## 2023-08-23 DIAGNOSIS — E1142 Type 2 diabetes mellitus with diabetic polyneuropathy: Secondary | ICD-10-CM

## 2023-08-23 DIAGNOSIS — J45909 Unspecified asthma, uncomplicated: Secondary | ICD-10-CM | POA: Insufficient documentation

## 2023-08-23 DIAGNOSIS — I5042 Chronic combined systolic (congestive) and diastolic (congestive) heart failure: Secondary | ICD-10-CM | POA: Diagnosis not present

## 2023-08-23 DIAGNOSIS — Z7984 Long term (current) use of oral hypoglycemic drugs: Secondary | ICD-10-CM

## 2023-08-23 DIAGNOSIS — M159 Polyosteoarthritis, unspecified: Secondary | ICD-10-CM | POA: Diagnosis not present

## 2023-08-23 LAB — COMPREHENSIVE METABOLIC PANEL
ALT: 18 U/L (ref 0–53)
AST: 19 U/L (ref 0–37)
Albumin: 4.1 g/dL (ref 3.5–5.2)
Alkaline Phosphatase: 66 U/L (ref 39–117)
BUN: 19 mg/dL (ref 6–23)
CO2: 30 meq/L (ref 19–32)
Calcium: 9.5 mg/dL (ref 8.4–10.5)
Chloride: 100 meq/L (ref 96–112)
Creatinine, Ser: 1.07 mg/dL (ref 0.40–1.50)
GFR: 65.76 mL/min (ref >60.00)
Glucose, Bld: 112 mg/dL — ABNORMAL HIGH (ref 70–99)
Potassium: 4.6 meq/L (ref 3.5–5.1)
Sodium: 136 meq/L (ref 135–145)
Total Bilirubin: 0.4 mg/dL (ref 0.2–1.2)
Total Protein: 7.2 g/dL (ref 6.0–8.3)

## 2023-08-23 LAB — LIPID PANEL
Cholesterol: 97 mg/dL (ref 0–200)
HDL: 40.6 mg/dL (ref >39.00)
LDL Cholesterol: 45 mg/dL (ref 0–99)
NonHDL: 56.57
Total CHOL/HDL Ratio: 2
Triglycerides: 60 mg/dL (ref 0.0–149.0)
VLDL: 12 mg/dL (ref 0.0–40.0)

## 2023-08-23 LAB — HEMOGLOBIN A1C: Hgb A1c MFr Bld: 6.6 % — ABNORMAL HIGH (ref 4.6–6.5)

## 2023-08-23 MED ORDER — NEBIVOLOL HCL 5 MG PO TABS: 5.0000 mg | ORAL_TABLET | Freq: Every day | ORAL | 3 refills | Status: DC

## 2023-08-23 NOTE — Progress Notes (Signed)
Subjective:   Patient ID: Jeffery [name], male   DOB: 82 y.o.   MRN: [id number]   HPI Patient presents with 2 different problems with 1 being chronic inflammation base of fifth metatarsal left and secondarily inflammation fluid of the inner phalangeal joint digit 3 left   ROS      Objective:  Physical Exam  Neurovascular status intact inflammation around the base of the fifth metatarsal left fluid buildup noted with lesion with inflammation of the third digit left inner phalangeal joint     Assessment:  Inflammatory tendinitis fifth metatarsal bones with peroneal insertional inflammation and inflammation of the third digit left inner phalangeal joint      Plan:  H&P reviewed both conditions.  Today I went ahead did sterile prep injected the base of the fifth metatarsal left 3 mg dexamethasone Kenalog 5 mg Xylocaine after explaining risk and injected the inner phalangeal joint digit 3 with 2 mg Dexasone Kenalog 5 mg Xylocaine debrided lesions that are present x 2 no angiogenic bleeding reappoint routine care

## 2023-08-23 NOTE — Assessment & Plan Note (Signed)
Chronic Generalized Stable  Pain management with Norco as needed Pain controlled.  Taking medication appropriately. Continue Norco 7.5-325 mg 1 tab every 6 hours as needed Continue gabapentin 200 mg 3 times daily [place] controlled substance database checked.

## 2023-08-23 NOTE — Assessment & Plan Note (Signed)
Chronic Regular exercise and healthy diet encouraged Check lipid panel  Continue Crestor 10 mg 3 times weekly 

## 2023-08-23 NOTE — Assessment & Plan Note (Signed)
Chronic ?Controlled, Stable ?Continue Ativan 0.5-1 mg nightly as needed ?

## 2023-08-23 NOTE — Assessment & Plan Note (Addendum)
Chronic Following with pulmonary Typically controlled-currently has an exacerbation likely viral in nature Continue singular 10 mg nightly, Allegra as needed Continue Symbicort 160-4.5 mcg per ACT twice daily, albuterol as needed

## 2023-08-23 NOTE — Assessment & Plan Note (Signed)
Chronic Following with cardiology Euvolemic on exam On Farxiga 10 mg daily, nebivolol 5 mg daily

## 2023-08-23 NOTE — Assessment & Plan Note (Signed)
Chronic Continue Crestor 10 mg 3 times a week Check lipid panel, CMP Encouraged healthy diet and regular exercise

## 2023-08-23 NOTE — Assessment & Plan Note (Signed)
Chronic Controlled Continue gabapentin 200 mg 3 times daily

## 2023-08-23 NOTE — Assessment & Plan Note (Signed)
Chronic Blood pressure well controlled CMP Continue Bystolic 5 mg daily 

## 2023-08-23 NOTE — Assessment & Plan Note (Addendum)
Chronic   Lab Results  Component Value Date   HGBA1C 7.0 (H) 12/31/2022   Sugars not ideally controlled Has not check sugars recently Check A1c Continue Farxiga 10 mg daily, metformin 500 mg twice daily Stressed regular exercise, diabetic diet

## 2023-08-23 NOTE — Assessment & Plan Note (Addendum)
Acute Likely viral in nature Symptoms slightly better He will continue to be aggressive with his asthma regimen Albuterol neb or inhaler every 6 hours, Symbicort twice daily He will let me know if his symptoms are not continuing to improve

## 2023-08-27 ENCOUNTER — Other Ambulatory Visit: Payer: Self-pay | Admitting: Internal Medicine

## 2023-08-29 ENCOUNTER — Ambulatory Visit: Payer: Medicare Other | Admitting: Podiatry

## 2023-09-01 ENCOUNTER — Other Ambulatory Visit: Payer: Self-pay

## 2023-09-02 ENCOUNTER — Encounter: Payer: Self-pay | Admitting: Internal Medicine

## 2023-09-02 ENCOUNTER — Telehealth: Payer: Self-pay | Admitting: Internal Medicine

## 2023-09-02 NOTE — Telephone Encounter (Signed)
Pt is having respiratory issues and "Burns knows me and knows what I'm going through he mentioned.  I tried setting up a appointment with him for the issues he's dealing with so you can see him and prescribe him something.The date I provided him that's on your schedule is 10 days from today and pt is not having it.[name]  He will be reaching out to you as well via mychart and understands that he wouldn't get a response until about 4pm today.  He's looking for some type of ANTIBIOTIC for this issue he's dealing with today since there is no open spots for today. He worked in [employer] so he knows how it all works.. However, I mentioned you may want to see him first but he was not with it still. Someone in clinical please reach out to patient at your earliest convenience.

## 2023-09-05 MED ORDER — PREDNISONE 20 MG PO TABS: 40.0000 mg | ORAL_TABLET | Freq: Every day | ORAL | 0 refills | Status: AC

## 2023-09-05 NOTE — Telephone Encounter (Signed)
Sent to [facility]

## 2023-09-17 ENCOUNTER — Other Ambulatory Visit: Payer: Self-pay | Admitting: Internal Medicine

## 2023-09-19 ENCOUNTER — Other Ambulatory Visit: Payer: Self-pay | Admitting: Internal Medicine

## 2023-09-20 MED ORDER — LORAZEPAM 1 MG PO TABS: ORAL_TABLET | ORAL | 5 refills | Status: DC

## 2023-09-29 ENCOUNTER — Other Ambulatory Visit: Payer: Self-pay | Admitting: Internal Medicine

## 2023-09-30 ENCOUNTER — Other Ambulatory Visit: Payer: Self-pay

## 2023-09-30 MED ORDER — HYDROCODONE-ACETAMINOPHEN 7.5-325 MG PO TABS
1.0000 | ORAL_TABLET | Freq: Four times a day (QID) | ORAL | 0 refills | Status: DC | PRN
  Filled 2023-09-30: qty 90, 23d supply, fill #0

## 2023-10-03 ENCOUNTER — Other Ambulatory Visit: Payer: Self-pay

## 2023-10-13 ENCOUNTER — Other Ambulatory Visit: Payer: Self-pay | Admitting: Internal Medicine

## 2023-10-28 ENCOUNTER — Ambulatory Visit (INDEPENDENT_AMBULATORY_CARE_PROVIDER_SITE_OTHER): Payer: Medicare Other

## 2023-10-28 DIAGNOSIS — I441 Atrioventricular block, second degree: Secondary | ICD-10-CM | POA: Diagnosis not present

## 2023-10-29 ENCOUNTER — Other Ambulatory Visit: Payer: Self-pay | Admitting: Internal Medicine

## 2023-10-29 LAB — CUP PACEART REMOTE DEVICE CHECK
Battery Remaining Longevity: 29 mo
Battery Remaining Percentage: 33 %
Battery Voltage: 2.95 V
Brady Statistic AP VP Percent: 38 %
Brady Statistic AP VS Percent: 1 %
Brady Statistic AS VP Percent: 58 %
Brady Statistic AS VS Percent: 1.7 %
Brady Statistic RA Percent Paced: 37 %
Date Time Interrogation Session: 20241115020010
Implantable Lead Connection Status: 753985
Implantable Lead Connection Status: 753985
Implantable Lead Connection Status: 753985
Implantable Lead Implant Date: 20190813
Implantable Lead Implant Date: 20190813
Implantable Lead Implant Date: 20190813
Implantable Lead Location: 753858
Implantable Lead Location: 753859
Implantable Lead Location: 753860
Implantable Pulse Generator Implant Date: 20190813
Lead Channel Impedance Value: 390 Ohm
Lead Channel Impedance Value: 440 Ohm
Lead Channel Impedance Value: 640 Ohm
Lead Channel Pacing Threshold Amplitude: 0.5 V
Lead Channel Pacing Threshold Amplitude: 0.75 V
Lead Channel Pacing Threshold Amplitude: 1.125 V
Lead Channel Pacing Threshold Pulse Width: 0.5 ms
Lead Channel Pacing Threshold Pulse Width: 0.5 ms
Lead Channel Pacing Threshold Pulse Width: 0.5 ms
Lead Channel Sensing Intrinsic Amplitude: 1.7 mV
Lead Channel Sensing Intrinsic Amplitude: 12 mV
Lead Channel Setting Pacing Amplitude: 1.75 V
Lead Channel Setting Pacing Amplitude: 2.125
Lead Channel Setting Pacing Amplitude: 2.5 V
Lead Channel Setting Pacing Pulse Width: 0.5 ms
Lead Channel Setting Pacing Pulse Width: 0.5 ms
Lead Channel Setting Sensing Sensitivity: 5 mV
Pulse Gen Model: 3562
Pulse Gen Serial Number: 9043864

## 2023-10-31 ENCOUNTER — Other Ambulatory Visit: Payer: Self-pay

## 2023-10-31 MED ORDER — HYDROCODONE-ACETAMINOPHEN 7.5-325 MG PO TABS
1.0000 | ORAL_TABLET | Freq: Four times a day (QID) | ORAL | 0 refills | Status: DC | PRN
  Filled 2023-10-31: qty 90, 23d supply, fill #0

## 2023-11-02 ENCOUNTER — Other Ambulatory Visit: Payer: Self-pay

## 2023-11-09 NOTE — Progress Notes (Signed)
Remote pacemaker transmission.   

## 2023-11-23 ENCOUNTER — Encounter: Payer: Self-pay | Admitting: Internal Medicine

## 2023-11-28 ENCOUNTER — Other Ambulatory Visit: Payer: Self-pay | Admitting: Internal Medicine

## 2023-11-28 ENCOUNTER — Other Ambulatory Visit: Payer: Self-pay

## 2023-11-28 MED ORDER — HYDROCODONE-ACETAMINOPHEN 7.5-325 MG PO TABS
1.0000 | ORAL_TABLET | Freq: Four times a day (QID) | ORAL | 0 refills | Status: DC | PRN
  Filled 2023-11-28 – 2023-12-12 (×2): qty 90, 23d supply, fill #0

## 2023-12-04 ENCOUNTER — Observation Stay (HOSPITAL_BASED_OUTPATIENT_CLINIC_OR_DEPARTMENT_OTHER)
Admission: EM | Admit: 2023-12-04 | Discharge: 2023-12-05 | Disposition: A | Discharge status: Home / Self Care | Payer: Medicare Other | Attending: Family Medicine | Admitting: Family Medicine

## 2023-12-04 ENCOUNTER — Observation Stay (HOSPITAL_COMMUNITY): Payer: Medicare Other

## 2023-12-04 ENCOUNTER — Encounter (HOSPITAL_BASED_OUTPATIENT_CLINIC_OR_DEPARTMENT_OTHER): Payer: Self-pay

## 2023-12-04 ENCOUNTER — Other Ambulatory Visit: Payer: Self-pay

## 2023-12-04 ENCOUNTER — Emergency Department (HOSPITAL_BASED_OUTPATIENT_CLINIC_OR_DEPARTMENT_OTHER): Payer: Medicare Other

## 2023-12-04 DIAGNOSIS — Z789 Other specified health status: Secondary | ICD-10-CM | POA: Insufficient documentation

## 2023-12-04 DIAGNOSIS — G8929 Other chronic pain: Secondary | ICD-10-CM

## 2023-12-04 DIAGNOSIS — Z7984 Long term (current) use of oral hypoglycemic drugs: Secondary | ICD-10-CM | POA: Diagnosis not present

## 2023-12-04 DIAGNOSIS — I110 Hypertensive heart disease with heart failure: Secondary | ICD-10-CM | POA: Insufficient documentation

## 2023-12-04 DIAGNOSIS — R0609 Other forms of dyspnea: Secondary | ICD-10-CM | POA: Diagnosis present

## 2023-12-04 DIAGNOSIS — Z7901 Long term (current) use of anticoagulants: Secondary | ICD-10-CM | POA: Diagnosis not present

## 2023-12-04 DIAGNOSIS — J189 Pneumonia, unspecified organism: Principal | ICD-10-CM | POA: Insufficient documentation

## 2023-12-04 DIAGNOSIS — I5022 Chronic systolic (congestive) heart failure: Secondary | ICD-10-CM | POA: Diagnosis present

## 2023-12-04 DIAGNOSIS — I5042 Chronic combined systolic (congestive) and diastolic (congestive) heart failure: Secondary | ICD-10-CM | POA: Diagnosis present

## 2023-12-04 DIAGNOSIS — I10 Essential (primary) hypertension: Secondary | ICD-10-CM | POA: Diagnosis present

## 2023-12-04 DIAGNOSIS — Z1152 Encounter for screening for COVID-19: Secondary | ICD-10-CM | POA: Diagnosis not present

## 2023-12-04 DIAGNOSIS — Z950 Presence of cardiac pacemaker: Secondary | ICD-10-CM | POA: Diagnosis not present

## 2023-12-04 DIAGNOSIS — R0602 Shortness of breath: Secondary | ICD-10-CM | POA: Diagnosis not present

## 2023-12-04 DIAGNOSIS — J45909 Unspecified asthma, uncomplicated: Secondary | ICD-10-CM | POA: Diagnosis not present

## 2023-12-04 DIAGNOSIS — E785 Hyperlipidemia, unspecified: Secondary | ICD-10-CM | POA: Diagnosis not present

## 2023-12-04 DIAGNOSIS — R0789 Other chest pain: Secondary | ICD-10-CM | POA: Diagnosis not present

## 2023-12-04 DIAGNOSIS — E1169 Type 2 diabetes mellitus with other specified complication: Secondary | ICD-10-CM | POA: Diagnosis present

## 2023-12-04 DIAGNOSIS — E1159 Type 2 diabetes mellitus with other circulatory complications: Secondary | ICD-10-CM | POA: Diagnosis present

## 2023-12-04 DIAGNOSIS — R079 Chest pain, unspecified: Principal | ICD-10-CM | POA: Diagnosis present

## 2023-12-04 LAB — GLUCOSE, CAPILLARY
Glucose-Capillary: 99 mg/dL (ref 70–99)
Glucose-Capillary: 135 mg/dL — ABNORMAL HIGH (ref 70–99)

## 2023-12-04 LAB — CBC
HCT: 39.7 % (ref 39.0–52.0)
Hemoglobin: 12.8 g/dL — ABNORMAL LOW (ref 13.0–17.0)
MCH: 29 pg (ref 26.0–34.0)
MCHC: 32.2 g/dL (ref 30.0–36.0)
MCV: 89.8 fL (ref 80.0–100.0)
Platelets: 244 10*3/uL (ref 150–400)
RBC: 4.42 MIL/uL (ref 4.22–5.81)
RDW: 13.8 % (ref 11.5–15.5)
WBC: 12 10*3/uL — ABNORMAL HIGH (ref 4.0–10.5)
nRBC: 0 % (ref 0.0–0.2)

## 2023-12-04 LAB — BASIC METABOLIC PANEL
Anion gap: 8 (ref 5–15)
BUN: 24 mg/dL — ABNORMAL HIGH (ref 8–23)
CO2: 29 mmol/L (ref 22–32)
Calcium: 9.9 mg/dL (ref 8.9–10.3)
Chloride: 103 mmol/L (ref 98–111)
Creatinine, Ser: 1.11 mg/dL (ref 0.61–1.24)
GFR, Estimated: >60 mL/min (ref >60)
Glucose, Bld: 124 mg/dL — ABNORMAL HIGH (ref 70–99)
Potassium: 4.4 mmol/L (ref 3.5–5.1)
Sodium: 140 mmol/L (ref 135–145)

## 2023-12-04 LAB — TROPONIN I (HIGH SENSITIVITY)
Troponin I (High Sensitivity): 29 ng/L — ABNORMAL HIGH (ref <18)
Troponin I (High Sensitivity): 27 ng/L — ABNORMAL HIGH (ref <18)
Troponin I (High Sensitivity): 35 ng/L — ABNORMAL HIGH (ref <18)
Troponin I (High Sensitivity): 31 ng/L — ABNORMAL HIGH (ref <18)

## 2023-12-04 LAB — RESP PANEL BY RT-PCR (RSV, FLU A&B, COVID)  RVPGX2
Influenza A by PCR: NEGATIVE
Influenza B by PCR: NEGATIVE
Resp Syncytial Virus by PCR: NEGATIVE
SARS Coronavirus 2 by RT PCR: NEGATIVE

## 2023-12-04 LAB — BRAIN NATRIURETIC PEPTIDE: B Natriuretic Peptide: 529.3 pg/mL — ABNORMAL HIGH (ref 0.0–100.0)

## 2023-12-04 LAB — D-DIMER, QUANTITATIVE: D-Dimer, Quant: 0.56 ug{FEU}/mL — ABNORMAL HIGH (ref 0.00–0.50)

## 2023-12-04 MED ORDER — POLYETHYLENE GLYCOL 3350 17 G PO PACK: 17.0000 g | PACK | Freq: Every day | ORAL | Status: DC | PRN

## 2023-12-04 MED ORDER — FAMOTIDINE 20 MG PO TABS
20.0000 mg | ORAL_TABLET | Freq: Every day | ORAL | Status: DC
  Administered 2023-12-04 – 2023-12-05 (×2): 20 mg via ORAL
  Filled 2023-12-04 (×2): qty 1

## 2023-12-04 MED ORDER — DOCUSATE SODIUM 100 MG PO CAPS
100.0000 mg | ORAL_CAPSULE | Freq: Two times a day (BID) | ORAL | Status: DC
  Administered 2023-12-04 – 2023-12-05 (×2): 100 mg via ORAL
  Filled 2023-12-04 (×2): qty 1

## 2023-12-04 MED ORDER — DIPHENHYDRAMINE HCL 50 MG/ML IJ SOLN
50.0000 mg | Freq: Once | INTRAMUSCULAR | Status: AC
  Administered 2023-12-04: 50 mg via INTRAVENOUS
  Filled 2023-12-04: qty 1

## 2023-12-04 MED ORDER — HYDROCODONE-ACETAMINOPHEN 7.5-325 MG PO TABS
1.0000 | ORAL_TABLET | Freq: Four times a day (QID) | ORAL | Status: DC | PRN
  Administered 2023-12-04 – 2023-12-05 (×2): 1 via ORAL
  Filled 2023-12-04 (×2): qty 1

## 2023-12-04 MED ORDER — DIPHENHYDRAMINE HCL 25 MG PO CAPS: 50.0000 mg | ORAL_CAPSULE | Freq: Once | ORAL | Status: AC

## 2023-12-04 MED ORDER — ONDANSETRON HCL 4 MG/2ML IJ SOLN: 4.0000 mg | Freq: Four times a day (QID) | INTRAMUSCULAR | Status: DC | PRN

## 2023-12-04 MED ORDER — DIPHENHYDRAMINE HCL 25 MG PO CAPS
50.0000 mg | ORAL_CAPSULE | Freq: Once | ORAL | Status: AC
Start: 2023-12-04 — End: 2023-12-04

## 2023-12-04 MED ORDER — ONDANSETRON HCL 4 MG PO TABS: 4.0000 mg | ORAL_TABLET | Freq: Four times a day (QID) | ORAL | Status: DC | PRN

## 2023-12-04 MED ORDER — ACETAMINOPHEN 650 MG RE SUPP: 650.0000 mg | Freq: Four times a day (QID) | RECTAL | Status: DC | PRN

## 2023-12-04 MED ORDER — INSULIN ASPART 100 UNIT/ML IJ SOLN: 0.0000 [IU] | Freq: Every day | INTRAMUSCULAR | Status: DC

## 2023-12-04 MED ORDER — NITROGLYCERIN 0.4 MG SL SUBL: 0.4000 mg | SUBLINGUAL_TABLET | SUBLINGUAL | Status: DC | PRN

## 2023-12-04 MED ORDER — INSULIN ASPART 100 UNIT/ML IJ SOLN
0.0000 [IU] | Freq: Three times a day (TID) | INTRAMUSCULAR | Status: DC
  Administered 2023-12-05: 3 [IU] via SUBCUTANEOUS
  Administered 2023-12-05: 2 [IU] via SUBCUTANEOUS

## 2023-12-04 MED ORDER — MOMETASONE FURO-FORMOTEROL FUM 200-5 MCG/ACT IN AERO
2.0000 | INHALATION_SPRAY | Freq: Two times a day (BID) | RESPIRATORY_TRACT | Status: DC
  Filled 2023-12-04: qty 8.8

## 2023-12-04 MED ORDER — GABAPENTIN 100 MG PO CAPS: 100.0000 mg | ORAL_CAPSULE | Freq: Three times a day (TID) | ORAL | Status: DC

## 2023-12-04 MED ORDER — ENOXAPARIN SODIUM 40 MG/0.4ML IJ SOSY
40.0000 mg | PREFILLED_SYRINGE | INTRAMUSCULAR | Status: DC
  Administered 2023-12-04: 40 mg via SUBCUTANEOUS
  Filled 2023-12-04: qty 0.4

## 2023-12-04 MED ORDER — METHYLPREDNISOLONE SODIUM SUCC 40 MG IJ SOLR
40.0000 mg | Freq: Once | INTRAMUSCULAR | Status: AC
  Administered 2023-12-04: 40 mg via INTRAVENOUS
  Filled 2023-12-04: qty 1

## 2023-12-04 MED ORDER — NEBIVOLOL HCL 5 MG PO TABS
5.0000 mg | ORAL_TABLET | Freq: Every day | ORAL | Status: DC
  Administered 2023-12-05: 5 mg via ORAL
  Filled 2023-12-04: qty 1

## 2023-12-04 MED ORDER — ROSUVASTATIN CALCIUM 5 MG PO TABS
10.0000 mg | ORAL_TABLET | ORAL | Status: DC
Start: 2023-12-05 — End: 2023-12-05
  Administered 2023-12-05: 10 mg via ORAL
  Filled 2023-12-04: qty 2

## 2023-12-04 MED ORDER — SODIUM CHLORIDE 0.9% FLUSH
3.0000 mL | Freq: Two times a day (BID) | INTRAVENOUS | Status: DC
  Administered 2023-12-04 – 2023-12-05 (×2): 3 mL via INTRAVENOUS

## 2023-12-04 MED ORDER — FUROSEMIDE 10 MG/ML IJ SOLN
20.0000 mg | Freq: Once | INTRAMUSCULAR | Status: AC
Start: 2023-12-04 — End: 2023-12-04
  Administered 2023-12-04: 20 mg via INTRAVENOUS
  Filled 2023-12-04: qty 2

## 2023-12-04 MED ORDER — ACETAMINOPHEN 325 MG PO TABS: 650.0000 mg | ORAL_TABLET | Freq: Four times a day (QID) | ORAL | Status: DC | PRN

## 2023-12-04 MED ORDER — MONTELUKAST SODIUM 10 MG PO TABS
10.0000 mg | ORAL_TABLET | Freq: Every day | ORAL | Status: DC
  Administered 2023-12-04: 10 mg via ORAL
  Filled 2023-12-04: qty 1

## 2023-12-04 MED ORDER — MORPHINE SULFATE (PF) 2 MG/ML IV SOLN
2.0000 mg | INTRAVENOUS | Status: DC | PRN
  Administered 2023-12-04: 2 mg via INTRAVENOUS
  Filled 2023-12-04: qty 1

## 2023-12-04 MED ORDER — HYDRALAZINE HCL 20 MG/ML IJ SOLN: 5.0000 mg | INTRAMUSCULAR | Status: DC | PRN

## 2023-12-04 MED ORDER — ASPIRIN 81 MG PO TBEC
81.0000 mg | DELAYED_RELEASE_TABLET | Freq: Every day | ORAL | Status: DC
Start: 2023-12-04 — End: 2023-12-04
  Filled 2023-12-04: qty 1

## 2023-12-04 MED ORDER — BISACODYL 5 MG PO TBEC: 5.0000 mg | DELAYED_RELEASE_TABLET | Freq: Every day | ORAL | Status: DC | PRN

## 2023-12-04 MED ORDER — ASPIRIN 81 MG PO TBEC
81.0000 mg | DELAYED_RELEASE_TABLET | Freq: Every day | ORAL | Status: DC
Start: 2023-12-04 — End: 2023-12-05
  Administered 2023-12-04: 81 mg via ORAL
  Filled 2023-12-04: qty 1

## 2023-12-04 MED ORDER — ALBUTEROL SULFATE (2.5 MG/3ML) 0.083% IN NEBU: 2.5000 mg | INHALATION_SOLUTION | RESPIRATORY_TRACT | Status: DC | PRN

## 2023-12-04 MED ORDER — LORAZEPAM 1 MG PO TABS: 1.0000 mg | ORAL_TABLET | Freq: Every evening | ORAL | Status: DC | PRN

## 2023-12-04 NOTE — Plan of Care (Signed)
  Problem: Safety: Goal: Ability to remain free from injury will improve Outcome: Progressing   

## 2023-12-04 NOTE — Consult Note (Signed)
Cardiology Consultation   Patient ID: Jeffery [name] MRN: [id number]; DOB: 06-May-1941  Admit date: 12/04/2023 Date of Consult: 12/04/2023  PCP:  Pincus Sanes, MD   [facility] Providers Cardiologist:  Lesleigh Noe, MD (Inactive)  Electrophysiologist:  Lewayne Bunting, MD       Patient Profile:   Jeffery [name] is a 82 y.o. male with a hx of chronic combined systolic and diastolic heart failure, Stokes-Adams syncope, status post BiV PPM 2019, hypertension, hyperlipidemia, OSA not type 2 diabetes who is being seen 12/04/2023 for the evaluation of chest pain at the request of Dr. Ophelia Charter.  History of Present Illness:   Jeffery [name] presented to the [facility] department this morning for evaluation of chest pain radiating to his back/shoulders.  Patient reports a minor fall a few days ago where he apparently also attempted to grab onto something to prevent this fall.  Patient expressed concern that he possibly injured a rib or pulled a muscle.  Patient having pain this morning with movement and deep breaths.  ECG with atrial sensed V paced rhythm.  BNP elevated at 529.3.  High-sensitivity troponin minimally elevated and downtrending, 29->27.  Patient has received pain medications and Benadryl.  The patient is currently very tangential, unable to stick to a story.  He states that he is not having any current discomfort, and the next minute states that he is having discomfort, but then talks about a completely separate issue.  He appears comfortable in bed.  Per the patient's wife, his pain developed overnight last night.  He told her about it this morning and they decided to come to the emergency room.  The pain was in the center of his chest and radiated outward to both shoulders.  There was no associated shortness of breath.  Pain potentially radiated to the back.  Patient last seen in general cardiology clinic in February 2024.  At that time patient generally  doing well from a cardiovascular standpoint though did appear to be ill with a viral infection.  At this visit he was continued on Farxiga 10 mg, Bystolic 5 mg, declining further guideline directed medical therapy.  His last echocardiogram in 2023 showed LVEF 40 to 45% with grade 3 diastolic dysfunction, mild RV dysfunction, and mild pulmonary hypertension.  Patient with remote history of cath in 2002 with mild nonobstructive disease.  Past Medical History:  Diagnosis Date   Arthritis    "thumbs" (07/25/2018)   Asthma    CHF (congestive heart failure) (HCC)    Chronic bronchitis (HCC)    Environmental allergies    GERD (gastroesophageal reflux disease)    Headache    "seasonal; w/environmental allergies" (07/25/2018)   History of blood transfusion    "when I had laminectomy" (07/25/2018)   HTN (hypertension)    Hyperlipidemia    LBBB (left bundle branch block) 1999   Metatarsal bone fracture 03/07/2018   Myocardial infarction [facility])    "was told I've had an old MI; probably in the 1990s" (07/25/2018)   Pneumonia    "as a child, age 65; viral pneumonia 3 times in the last 10 years" (07/25/2018)   Presence of permanent cardiac pacemaker 07/25/2018   Seasonal allergies    Sleep apnea    "wife says I do" (07/25/2018)   Type II diabetes mellitus (HCC)     Past Surgical History:  Procedure Laterality Date   BACK SURGERY     BIV PACEMAKER INSERTION CRT-P  07/25/2018  BIV PACEMAKER INSERTION CRT-P N/A 07/25/2018   Procedure: BIV PACEMAKER INSERTION CRT-P;  Surgeon: Marinus Maw, MD;  Location: [facility] INVASIVE CV LAB;  Service: Cardiovascular;  Laterality: N/A;   CARDIAC CATHETERIZATION  2003   Dr Garnette Scheuermann; 85 % R circumflex obstruction   CARPAL TUNNEL RELEASE Left 10/11/2014   Procedure: LEFT CARPAL TUNNEL RELEASE;  Surgeon: Dominica Severin, MD;  Location: [facility];  Service: Orthopedics;  Laterality: Left;   COLONOSCOPY W/ BIOPSIES AND POLYPECTOMY  2018   INGUINAL HERNIA  REPAIR Right 1948   INGUINAL HERNIA REPAIR Left 1988   LUMBAR LAMINECTOMY  1984   MINOR CARPAL TUNNEL Right 11/22/2014   Procedure: RIGHT LIMITED OPEN CARPAL TUNNEL RELEASE;  Surgeon: Dominica Severin, MD;  Location: [facility];  Service: Orthopedics;  Laterality: Right;   TONSILLECTOMY  1958     Home Medications:  Prior to Admission medications   Medication Sig Start Date End Date Taking? Authorizing Provider  ACCU-CHEK AVIVA PLUS test strip USE AS INSTRUCTED TO TEST BLOOD SUGAR 6 TIMES DAILY. DX CODE: E11.59 12/17/19   Pincus Sanes, MD  ACCU-CHEK SOFTCLIX LANCETS lancets Use to test blood sugar daily as directed. 09/15/15   Pecola Lawless, MD  albuterol (PROVENTIL) (2.5 MG/3ML) 0.083% nebulizer solution TAKE 3 MLS BY NEBULIZATION EVERY 6 (SIX) HOURS AS NEEDED FOR WHEEZING OR SHORTNESS OF BREATH. 04/29/20   Pincus Sanes, MD  albuterol (VENTOLIN HFA) 108 (90 Base) MCG/ACT inhaler TAKE 2 PUFFS BY MOUTH EVERY 6 HOURS AS NEEDED FOR WHEEZE OR SHORTNESS OF BREATH 06/21/22   Pincus Sanes, MD  aspirin 81 MG tablet Take 81 mg by mouth at bedtime.    [provider]  celecoxib (CELEBREX) 100 MG capsule TAKE 1 CAPSULE BY MOUTH EVERY DAY 12/13/22   Judi Saa, DO  Continuous Blood Gluc Receiver (FREESTYLE LIBRE READER) DEVI 1 Device by Does not apply route every 14 (fourteen) days. Freestyle libre 3 reader 10/09/21   Pincus Sanes, MD  Continuous Glucose Sensor (FREESTYLE LIBRE 14 DAY SENSOR) MISC APPLY 1 SENSOR EVERY 14 DAYS 06/07/23   Pincus Sanes, MD  dapagliflozin propanediol (FARXIGA) 10 MG TABS tablet Take 1 tablet (10 mg total) by mouth daily before breakfast. 06/09/23   Meriam Sprague, MD  famotidine (PEPCID) 20 MG tablet Take 1 tablet (20 mg total) by mouth daily. 12/13/18 09/03/21  Rolly Salter, MD  fexofenadine (ALLEGRA) 180 MG tablet Take 180 mg by mouth daily as needed for allergies.     [provider]  gabapentin (NEURONTIN) 100 MG capsule TAKE  2 CAPSULES BY MOUTH 3 TIMES DAILY 02/23/23   Judi Saa, DO  HYDROcodone-acetaminophen (NORCO) 7.5-325 MG tablet Take 1 tablet by mouth every 6 (six) hours as needed for severe pain (pain score 7-10) (for chronic joint pain, neck pain). 11/28/23   Burns, Bobette Mo, MD  LORazepam (ATIVAN) 1 MG tablet TAKE 1/2 TO 1 TABLET BY MOUTH NIGHTLY AS NEEDED. 09/20/23   Burns, Bobette Mo, MD  metFORMIN (GLUCOPHAGE) 500 MG tablet TAKE 1 TABLET BY MOUTH WITH  BREAKFAST AND 2 TABLETS BY MOUTH WITH DINNER 08/29/23   Burns, Bobette Mo, MD  montelukast (SINGULAIR) 10 MG tablet TAKE 1 TABLET BY MOUTH AT  BEDTIME 07/19/23   Pincus Sanes, MD  Multiple Vitamin (MULTIVITAMIN WITH MINERALS) TABS tablet Take 1 tablet by mouth daily.    [provider]  nebivolol (BYSTOLIC) 5 MG tablet Take 1 tablet (  5 mg total) by mouth daily. 08/23/23   Marinus Maw, MD  nitroGLYCERIN (NITROSTAT) 0.4 MG SL tablet Place 1 tablet (0.4 mg total) under the tongue every 5 (five) minutes x 3 doses as needed for chest pain. 03/22/23   Meriam Sprague, MD  Respiratory Therapy Supplies (FLUTTER) DEVI Twice a day and prn as needed, may increase if feeling worse 01/16/21   Coral Ceo, NP  rosuvastatin (CRESTOR) 10 MG tablet TAKE 1 TABLET ON MONDAY, WEDNESDAY & FRIDAY. 08/16/23   Weaver, Scott T, PA-C  SYMBICORT 160-4.5 MCG/ACT inhaler USE 1 TO 2 INHALATIONS BY  MOUTH EVERY 12 HOURS AS  NEEDED 03/08/23   Pincus Sanes, MD  vardenafil (LEVITRA) 20 MG tablet TAKE 1 TABLET BY MOUTH AS DIRECTED. CAN NOT BE TAKEN WITH NITROGLYCERIN. NOT COVERED BY INSURANCE 10/13/23   Pincus Sanes, MD  zinc gluconate 50 MG tablet Take 50 mg by mouth daily.    [provider]    Inpatient Medications: Scheduled Meds:  aspirin  81 mg Oral QHS   [COMPLETED] diphenhydrAMINE  50 mg Oral Once   Or   [COMPLETED] diphenhydrAMINE  50 mg Intravenous Once   docusate sodium  100 mg Oral BID   enoxaparin (LOVENOX) injection  40 mg Subcutaneous Q24H   famotidine   20 mg Oral Daily   gabapentin  100 mg Oral TID   insulin aspart  0-15 Units Subcutaneous TID WC   insulin aspart  0-5 Units Subcutaneous QHS   LORazepam  1 mg Oral QHS   mometasone-formoterol  2 puff Inhalation BID   montelukast  10 mg Oral QHS   nebivolol  5 mg Oral Daily   rosuvastatin  10 mg Oral Daily   sodium chloride flush  3 mL Intravenous Q12H   Continuous Infusions:  PRN Meds: acetaminophen **OR** acetaminophen, albuterol, bisacodyl, hydrALAZINE, HYDROcodone-acetaminophen, morphine injection, nitroGLYCERIN, ondansetron **OR** ondansetron (ZOFRAN) IV, polyethylene glycol  Allergies:    Allergies  Allergen Reactions   Contrast Media [Iodinated Contrast Media] Other (See Comments)    Redness and warm sensation, this reaction was noted on 02/27/13 during a Cardiac MRI per pt.  Pt sts he had erythema on his head, chest, and shoulders.  Pt had a pacemaker placed August 2019 and was pre-medicated for the dye and had no allergic reaction at that time. -Carissa Mozingo B.S. RT(R)(CT)    Social History:   Social History   Socioeconomic History   Marital status: Married    Spouse name: Not on file   Number of children: 3   Years of education: Not on file   Highest education level: Professional school degree (e.g., MD, DDS, DVM, JD)  Occupational History   Occupation: retired Scientist, forensic: [employer]  Tobacco Use   Smoking status: Former    Current packs/day: 0.00    Average packs/day: 2.0 packs/day for 2.0 years (4.0 ttl pk-yrs)    Types: Cigarettes    Start date: 12/13/1961    Quit date: 12/14/1963    Years since quitting: 60.0   Smokeless tobacco: Never  Vaping Use   Vaping status: Never Used  Substance and Sexual Activity   Alcohol use: Not Currently    Alcohol/week: 2.0 standard drinks of alcohol    Types: 2 Glasses of wine per week    Comment: occasional   Drug use: Never   Sexual activity: Yes  Other Topics Concern   Not on file  Social History  Narrative  Lives with wife in a 2 story home.  Has 3 daughters.  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     Social Drivers of Corporate investment banker Strain: Low Risk  (06/07/2023)   Overall Financial Resource Strain (CARDIA)    Difficulty of Paying Living Expenses: Not hard at all  Food Insecurity: No Food Insecurity (12/04/2023)   Hunger Vital Sign    Worried About Running Out of Food in the Last Year: Never true    Ran Out of Food in the Last Year: Never true  Transportation Needs: No Transportation Needs (12/04/2023)   PRAPARE - Administrator, Civil Service (Medical): No    Lack of Transportation (Non-Medical): No  Physical Activity: Sufficiently Active (06/07/2023)   Exercise Vital Sign    Days of Exercise per Week: 5 days    Minutes of Exercise per Session: 30 min  Stress: No Stress Concern Present (06/07/2023)   [employer] of Occupational Health - Occupational Stress Questionnaire    Feeling of Stress : Not at all  Social Connections: Socially Integrated (06/07/2023)   Social Connection and Isolation Panel [NHANES]    Frequency of Communication with Friends and Family: More than three times a week    Frequency of Social Gatherings with Friends and Family: More than three times a week    Attends Religious Services: More than 4 times per year    Active Member of [employer] or Organizations: Yes    Attends Engineer, structural: More than 4 times per year    Marital Status: Married  Catering manager Violence: Not At Risk (12/04/2023)   Humiliation, Afraid, Rape, and Kick questionnaire    Fear of Current or Ex-Partner: No    Emotionally Abused: No    Physically Abused: No    Sexually Abused: No    Family History:    Family History  Problem Relation Age of Onset   Heart attack Father        74s   Colon polyps Father    Heart failure Mother        90s   Subarachnoid hemorrhage Mother 31   Hypertension Mother    Subarachnoid hemorrhage Paternal  Grandfather 24   Diabetes Maternal Aunt    Colon cancer Neg Hx      ROS:  Please see the history of present illness.   All other ROS reviewed and negative.     Physical Exam/Data:   Vitals:   12/04/23 0752 12/04/23 1225 12/04/23 1407  BP: 133/78  128/80  Pulse: 81  76  Resp: 17  19  Temp: 98.3 F (36.8 C) 98.1 F (36.7 C) 98.8 F (37.1 C)  TempSrc: Oral Oral Oral  SpO2: 99%  98%   No intake or output data in the 24 hours ending 12/04/23 1540    08/23/2023    9:01 AM 08/16/2023    3:46 PM 06/07/2023    9:17 AM  Last 3 Weights  Weight (lbs) 179 lb 12.8 oz 181 lb 12.8 oz 179 lb 3.2 oz  Weight (kg) 81.557 kg 82.464 kg 81.285 kg     There is no height or weight on file to calculate BMI.  General:  Well nourished, well developed, in no acute distress HEENT: Pinpoint pupils Neck: no JVD Vascular: No carotid bruits; Distal pulses 2+ bilaterally Cardiac:  normal S1, S2; RRR; no murmur  Lungs:  clear to auscultation bilaterally, no wheezing, rhonchi or rales  Abd: soft, nontender, no hepatomegaly  Ext: no edema Musculoskeletal:  No deformities, BUE and BLE strength normal and equal Skin: warm and dry  Neuro:  CNs 2-12 intact, no focal abnormalities noted Psych:  Normal affect   EKG:  The EKG was personally reviewed and demonstrates:  Atrial sensed V paced rhythm.  Telemetry:  Telemetry was personally reviewed and demonstrates:  A sense, V pace  Relevant CV Studies:  04/05/22 TTE  IMPRESSIONS     1. Left ventricular ejection fraction, by estimation, is 40 to 45%. The  left ventricle has mildly decreased function. The left ventricle  demonstrates global hypokinesis. Left ventricular diastolic parameters are  consistent with Grade III diastolic  dysfunction (restrictive). Elevated left atrial pressure.   2. Right ventricular systolic function is mildly reduced. The right  ventricular size is mildly enlarged. There is mildly elevated pulmonary  artery systolic pressure.  The estimated right ventricular systolic  pressure is 36.6 mmHg.   3. The mitral valve is normal in structure. Trivial mitral valve  regurgitation. No evidence of mitral stenosis.   4. The aortic valve was not well visualized. Aortic valve regurgitation  is not visualized. No aortic stenosis is present.   5. The inferior vena cava is normal in size with greater than 50%  respiratory variability, suggesting right atrial pressure of 3 mmHg.   FINDINGS   Left Ventricle: Left ventricular ejection fraction, by estimation, is 40  to 45%. The left ventricle has mildly decreased function. The left  ventricle demonstrates global hypokinesis. The left ventricular internal  cavity size was normal in size. There is   no left ventricular hypertrophy. Left ventricular diastolic parameters  are consistent with Grade III diastolic dysfunction (restrictive).  Elevated left atrial pressure.   Right Ventricle: The right ventricular size is mildly enlarged. Right  vetricular wall thickness was not well visualized. Right ventricular  systolic function is mildly reduced. There is mildly elevated pulmonary  artery systolic pressure. The tricuspid  regurgitant velocity is 2.90 m/s, and with an assumed right atrial  pressure of 3 mmHg, the estimated right ventricular systolic pressure is  36.6 mmHg.   Left Atrium: Left atrial size was normal in size.   Right Atrium: Right atrial size was normal in size.   Pericardium: There is no evidence of pericardial effusion.   Mitral Valve: The mitral valve is normal in structure. Trivial mitral  valve regurgitation. No evidence of mitral valve stenosis.   Tricuspid Valve: The tricuspid valve is normal in structure. Tricuspid  valve regurgitation is trivial.   Aortic Valve: The aortic valve was not well visualized. Aortic valve  regurgitation is not visualized. No aortic stenosis is present.   Pulmonic Valve: The pulmonic valve was not well visualized. Pulmonic  valve  regurgitation is not visualized.   Aorta: The aortic root and ascending aorta are structurally normal, with  no evidence of dilitation.   Venous: The inferior vena cava is normal in size with greater than 50%  respiratory variability, suggesting right atrial pressure of 3 mmHg.   IAS/Shunts: The interatrial septum was not well visualized.   Laboratory Data:  High Sensitivity Troponin:   Recent Labs  Lab 12/04/23 0824 12/04/23 0954  TROPONINIHS 29* 27*     Chemistry Recent Labs  Lab 12/04/23 0824  NA 140  K 4.4  CL 103  CO2 29  GLUCOSE 124*  BUN 24*  CREATININE 1.11  CALCIUM 9.9  GFRNONAA >60  ANIONGAP 8    No results for input(s): "PROT", "ALBUMIN", "AST", "ALT", "  ALKPHOS", "BILITOT" in the last 168 hours. Lipids No results for input(s): "CHOL", "TRIG", "HDL", "LABVLDL", "LDLCALC", "CHOLHDL" in the last 168 hours.  Hematology Recent Labs  Lab 12/04/23 0824  WBC 12.0*  RBC 4.42  HGB 12.8*  HCT 39.7  MCV 89.8  MCH 29.0  MCHC 32.2  RDW 13.8  PLT 244   Thyroid No results for input(s): "TSH", "FREET4" in the last 168 hours.  BNP Recent Labs  Lab 12/04/23 0824  BNP 529.3*    DDimer  Recent Labs  Lab 12/04/23 1100  DDIMER 0.56*     Radiology/Studies:  DG Chest Port 1 View Result Date: 12/04/2023 CLINICAL DATA:  [id number] Chest pain [id number] EXAM: PORTABLE CHEST 1 VIEW COMPARISON:  12/11/2018 chest radiograph. FINDINGS: Stable configuration of 3 lead left subclavian pacemaker with lead tips overlying the right atrium, right ventricle and coronary sinus. Stable cardiomediastinal silhouette with normal heart size. No pneumothorax. No pleural effusion. Lungs appear clear, with no acute consolidative airspace disease and no pulmonary edema. IMPRESSION: No active disease. Electronically Signed   By: Delbert Phenix M.D.   On: 12/04/2023 09:06     Assessment and Plan:   Atypical chest pain Non-obstructive CAD  Remote catheterization with nonobstructive  coronary artery disease.  Evaluation of EKG for ischemic changes limited by atrial sensed ventricular pacing.  However troponin of 29 trended to 27 reassuring.  With D-dimer 0.56 and no significant risk factors for pulmonary embolism, lower concern for this.  For now, would hold off on anticoagulation.  With his negative D-dimer, PE or aortic dissection would be low on the differential list.  I do not think that he has having a cardiac issue at the moment that would be causing him to have chest discomfort.  That being said, further questioning when the patient's sedative medications have worn off would be of benefit.  High Degree AVB with history of Stokes Adams Syncope  S/p PPM placement. Follows with Dr. Ladona Ridgel.  Latest remote device check with appropriate histograms, stable leads and battery.  Combined systolic and diastolic heart failure  Most recent echocardiogram in April 2023 found left ventricular ejection fraction 40 to 45% with grade 3 diastolic dysfunction, mild RV dysfunction, and mild pulmonary hypertension.  Patient has been maintained on Farxiga 10 mg, Bystolic 5 mg.  According to most recent general cardiology office visit note, patient declined additional guideline directed medical therapy at that time.   BNP today 529.3.  Clinically patient appears euvolemic Continue Farxiga 10 mg and Bystolic 5 mg.  Hypertension Despite chest discomfort, patient with normal blood pressures.  Recommend continuing home Bystolic 5 mg.  Hyperlipidemia LDL well-controlled/at goal.  Continue Crestor 10 mg daily  Lab Results  Component Value Date   CHOL 97 08/23/2023   HDL 40.60 08/23/2023   LDLCALC 45 08/23/2023   TRIG 60.0 08/23/2023   CHOLHDL 2 08/23/2023   Type 2 diabetes Management per primary team.  Risk Assessment/Risk Scores:        New York Heart Association (NYHA) Functional Class NYHA Class I        For questions or updates, please contact [facility] Please  consult [email or web address] for contact info under   Loman Brooklyn, MD 12/04/2023 3:40 PM

## 2023-12-04 NOTE — H&P (Signed)
History and Physical    Patient: Jeffery [name] ZOX:[id number] DOB: [birth year] DOA: 12/04/2023 DOS: the patient was seen and examined on 12/04/2023 PCP: Pincus Sanes, MD  Patient coming from: Home - lives with wife, grandchildren; NOK: [place], [place], [phone]   Chief Complaint: Exertional SOB  HPI: Jeffery [name] is a 82 y.o. male with medical history significant of HTN, HLD, COPD, pacemaker, CAD, DM, OSA, and chronic combined CHF presenting with chest pain.  He reports about a month of exertional CP/SOB.  This significantly worsened about 0400 today with pain all the way across his chest and radiating into his L axilla and back.  He is also SOB with conversation and exertion, although not currently requiring [place] O2.  He is a retired Scientist, clinical (histocompatibility and immunogenetics) and recognized the severity of the condition and so came in.  He has seen Dr. Katrinka Blazing from cardiology for years and was not recommended to take diuretics.     ER Course:  [facility] transfer for chest pain, SOB.  H/o CHF, not on diuretics.  Mild troponin elevation, BNP 500s.  Given Lasix 20 mg IV.        Review of Systems: As mentioned in the history of present illness. All other systems reviewed and are negative. Past Medical History:  Diagnosis Date   Arthritis    "thumbs" (07/25/2018)   Asthma    CHF (congestive heart failure) (HCC)    Chronic bronchitis (HCC)    Environmental allergies    GERD (gastroesophageal reflux disease)    Headache    "seasonal; w/environmental allergies" (07/25/2018)   History of blood transfusion    "when I had laminectomy" (07/25/2018)   HTN (hypertension)    Hyperlipidemia    LBBB (left bundle branch block) 1999   Metatarsal bone fracture 03/07/2018   Myocardial infarction [facility])    "was told I've had an old MI; probably in the 68s" (07/25/2018)   Pneumonia    "as a child, age 28; viral pneumonia 3 times in the last 10 years" (07/25/2018)   Presence of permanent cardiac pacemaker  07/25/2018   Seasonal allergies    Sleep apnea    "wife says I do" (07/25/2018)   Type II diabetes mellitus (HCC)    Past Surgical History:  Procedure Laterality Date   BACK SURGERY     BIV PACEMAKER INSERTION CRT-P  07/25/2018   BIV PACEMAKER INSERTION CRT-P N/A 07/25/2018   Procedure: BIV PACEMAKER INSERTION CRT-P;  Surgeon: Marinus Maw, MD;  Location: [facility] IN[facility];  Service: Cardiovascular;  Laterality: N/A;   CARDIAC CATHETERIZATION  2003   Dr Garnette Scheuermann; 85 % R circumflex obstruction   CARPAL TUNNEL RELEASE Left 10/11/2014   Procedure: LEFT CARPAL TUNNEL RELEASE;  Surgeon: Dominica Severin, MD;  Location: [facility];  Service: Orthopedics;  Laterality: Left;   COLONOSCOPY W/ BIOPSIES AND POLYPECTOMY  2018   INGUINAL HERNIA REPAIR Right 1948   INGUINAL HERNIA REPAIR Left 1988   LUMBAR LAMINECTOMY  1984   MINOR CARPAL TUNNEL Right 11/22/2014   Procedure: RIGHT LIMITED OPEN CARPAL TUNNEL RELEASE;  Surgeon: Dominica Severin, MD;  Location: [facility];  Service: Orthopedics;  Laterality: Right;   TONSILLECTOMY  1958   Social History:  reports that he quit smoking about 60 years ago. His smoking use included cigarettes. He started smoking about 62 years ago. He has a 4 pack-year smoking history. He has never used smokeless tobacco. He reports that he does not  currently use alcohol after a past usage of about 2.0 standard drinks of alcohol per week. He reports that he does not use drugs.  Allergies  Allergen Reactions   Contrast Media [Iodinated Contrast Media] Other (See Comments)    Redness and warm sensation, this reaction was noted on 02/27/13 during a Cardiac MRI per pt.  Pt sts he had erythema on his head, chest, and shoulders.  Pt had a pacemaker placed August 2019 and was pre-medicated for the dye and had no allergic reaction at that time. -Carissa Mozingo B.S. RT(R)(CT)    Family History  Problem Relation Age of Onset   Heart attack Father         59s   Colon polyps Father    Heart failure Mother        90s   Subarachnoid hemorrhage Mother 25   Hypertension Mother    Subarachnoid hemorrhage Paternal Grandfather 55   Diabetes Maternal Aunt    Colon cancer Neg Hx     Prior to Admission medications   Medication Sig Start Date End Date Taking? Authorizing Provider  ACCU-CHEK AVIVA PLUS test strip USE AS INSTRUCTED TO TEST BLOOD SUGAR 6 TIMES DAILY. DX CODE: E11.59 12/17/19   Pincus Sanes, MD  ACCU-CHEK SOFTCLIX LANCETS lancets Use to test blood sugar daily as directed. 09/15/15   Pecola Lawless, MD  albuterol (PROVENTIL) (2.5 MG/3ML) 0.083% nebulizer solution TAKE 3 MLS BY NEBULIZATION EVERY 6 (SIX) HOURS AS NEEDED FOR WHEEZING OR SHORTNESS OF BREATH. 04/29/20   Pincus Sanes, MD  albuterol (VENTOLIN HFA) 108 (90 Base) MCG/ACT inhaler TAKE 2 PUFFS BY MOUTH EVERY 6 HOURS AS NEEDED FOR WHEEZE OR SHORTNESS OF BREATH 06/21/22   Pincus Sanes, MD  aspirin 81 MG tablet Take 81 mg by mouth at bedtime.    [provider]  celecoxib (CELEBREX) 100 MG capsule TAKE 1 CAPSULE BY MOUTH EVERY DAY 12/13/22   Judi Saa, DO  Continuous Blood Gluc Receiver (FREESTYLE LIBRE READER) DEVI 1 Device by Does not apply route every 14 (fourteen) days. Freestyle libre 3 reader 10/09/21   Pincus Sanes, MD  Continuous Glucose Sensor (FREESTYLE LIBRE 14 DAY SENSOR) MISC APPLY 1 SENSOR EVERY 14 DAYS 06/07/23   Pincus Sanes, MD  dapagliflozin propanediol (FARXIGA) 10 MG TABS tablet Take 1 tablet (10 mg total) by mouth daily before breakfast. 06/09/23   Meriam Sprague, MD  famotidine (PEPCID) 20 MG tablet Take 1 tablet (20 mg total) by mouth daily. 12/13/18 09/03/21  Rolly Salter, MD  fexofenadine (ALLEGRA) 180 MG tablet Take 180 mg by mouth daily as needed for allergies.     [provider]  gabapentin (NEURONTIN) 100 MG capsule TAKE 2 CAPSULES BY MOUTH 3 TIMES DAILY 02/23/23   Judi Saa, DO  HYDROcodone-acetaminophen (NORCO)  7.5-325 MG tablet Take 1 tablet by mouth every 6 (six) hours as needed for severe pain (pain score 7-10) (for chronic joint pain, neck pain). 11/28/23   Burns, Bobette Mo, MD  LORazepam (ATIVAN) 1 MG tablet TAKE 1/2 TO 1 TABLET BY MOUTH NIGHTLY AS NEEDED. 09/20/23   Burns, Bobette Mo, MD  metFORMIN (GLUCOPHAGE) 500 MG tablet TAKE 1 TABLET BY MOUTH WITH  BREAKFAST AND 2 TABLETS BY MOUTH WITH DINNER 08/29/23   Burns, Bobette Mo, MD  montelukast (SINGULAIR) 10 MG tablet TAKE 1 TABLET BY MOUTH AT  BEDTIME 07/19/23   Pincus Sanes, MD  Multiple Vitamin (MULTIVITAMIN WITH MINERALS) TABS tablet  Take 1 tablet by mouth daily.    [provider]  nebivolol (BYSTOLIC) 5 MG tablet Take 1 tablet (5 mg total) by mouth daily. 08/23/23   Marinus Maw, MD  nitroGLYCERIN (NITROSTAT) 0.4 MG SL tablet Place 1 tablet (0.4 mg total) under the tongue every 5 (five) minutes x 3 doses as needed for chest pain. 03/22/23   Meriam Sprague, MD  Respiratory Therapy Supplies (FLUTTER) DEVI Twice a day and prn as needed, may increase if feeling worse 01/16/21   Coral Ceo, NP  rosuvastatin (CRESTOR) 10 MG tablet TAKE 1 TABLET ON MONDAY, WEDNESDAY & FRIDAY. 08/16/23   Weaver, Scott T, PA-C  SYMBICORT 160-4.5 MCG/ACT inhaler USE 1 TO 2 INHALATIONS BY  MOUTH EVERY 12 HOURS AS  NEEDED 03/08/23   Pincus Sanes, MD  vardenafil (LEVITRA) 20 MG tablet TAKE 1 TABLET BY MOUTH AS DIRECTED. CAN NOT BE TAKEN WITH NITROGLYCERIN. NOT COVERED BY INSURANCE 10/13/23   Pincus Sanes, MD  zinc gluconate 50 MG tablet Take 50 mg by mouth daily.    [provider]    Physical Exam: Vitals:   12/04/23 0752 12/04/23 1225 12/04/23 1407  BP: 133/78  128/80  Pulse: 81  76  Resp: 17  19  Temp: 98.3 F (36.8 C) 98.1 F (36.7 C) 98.8 F (37.1 C)  TempSrc: Oral Oral Oral  SpO2: 99%  98%   General:  Appears calm but uncomfortable and is in NAD Eyes:  EOMI, normal lids, iris ENT:  grossly normal hearing, lips & tongue, mmm; appropriate  dentition Neck:  no LAD, masses or thyromegaly; no JVD Cardiovascular:  RRR, no m/r/g. No LE edema.  Respiratory:   CTA bilaterally with no wheezes/rales/rhonchi.  Normal to mildly increased respiratory effort. Abdomen:  soft, NT, ND Skin:  no rash or induration seen on limited exam Musculoskeletal:  grossly normal tone BUE/BLE, good ROM, no bony abnormality Psychiatric: mildly anxious mood and affect, speech fluent and appropriate, AOx3 Neurologic:  CN 2-12 grossly intact, moves all extremities in coordinated fashion   Radiological Exams on Admission: Independently reviewed - see discussion in A/P where applicable  DG Chest Port 1 View Result Date: 12/04/2023 CLINICAL DATA:  [id number] Chest pain [id number] EXAM: PORTABLE CHEST 1 VIEW COMPARISON:  12/11/2018 chest radiograph. FINDINGS: Stable configuration of 3 lead left subclavian pacemaker with lead tips overlying the right atrium, right ventricle and coronary sinus. Stable cardiomediastinal silhouette with normal heart size. No pneumothorax. No pleural effusion. Lungs appear clear, with no acute consolidative airspace disease and no pulmonary edema. IMPRESSION: No active disease. Electronically Signed   By: Delbert Phenix M.D.   On: 12/04/2023 09:06    EKG: Independently reviewed.  Atrial sensed, ventricular paced with rate 75; QTc 518 - more prolonged conduction than prior   Labs on Admission: I have personally reviewed the available labs and imaging studies at the time of the admission.  Pertinent labs:    Glucose 124 BNP 529.3 HS troponin 29, 27 WBC 12 Hgb 12.8 D-dimer 0.56 COVID/flu/RSV negative   Assessment and Plan: Principal Problem:   Exertional shortness of breath Active Problems:   Type 2 diabetes mellitus with vascular disease (HCC)   HLD (hyperlipidemia)   Asthma   Essential hypertension   Chronic combined systolic and diastolic CHF (congestive heart failure) (HCC)   Encounter for chronic pain management    Pacemaker   Chest pain on exertion    Assessment and Plan:  Chest pain Patient with  substernal chest pressure and exertional dyspnea that has come on intermittently for weeks, but markedly worsened this AM and has not improved since CXR unremarkable.   Troponin minimally elevated with negative delta but still concerning for ischemia, consider heparin infusion (will defer to cardiology) Patient has biventricular pacer, EKG with more prolonged conduction than prior Will plan to place in observation status on telemetry to rule out ACS by overnight observation.  Repeat HS troponin Continue ASA 81 mg daily Cardiology consultation requested STAT CTA ordered but patient has dye allergy so this will not be performed until 4 hours after Benadryl and Solumedrol per protocol  HTN Continue bystolic Will also add prn hydralazine  HLD Continue Crestor, although will increase to daily (instead of 3 times weekly) Recent lipids: 97/41/45/60  DM Last A1c was 6.6, good control Hold [place] and metformin Will cover with moderate-scale SSI for now Continue gabapentin  Asthma Continue Singulair, Symbicort (Dulera per formulary) Continue Albuterol prn Not on home O2 and no current need for supplemental O2  Chronic pain I have reviewed this patient in the [place] Controlled Substances Reporting System.  He is generally receiving medications from only one provider and appears to be taking them as prescribed. He is at high risk of opioid misuse, diversion, or overdose.   Code status Full code, but he would no want prolonged efforts     Advance Care Planning:   Code Status: Full Code - Code status was discussed with the patient at the time of admission.  The patient would want to receive full resuscitative measures at this time.   Consults: Cardiology  DVT Prophylaxis: Lovenox for now unless heparin is desired by cardiology  Family Communication: None present; I left a voice mail for his  wife  Severity of Illness: The appropriate patient status for this patient is OBSERVATION. Observation status is judged to be reasonable and necessary in order to provide the required intensity of service to ensure the patient's safety. The patient's presenting symptoms, physical exam findings, and initial radiographic and laboratory data in the context of their medical condition is felt to place them at decreased risk for further clinical deterioration. Furthermore, it is anticipated that the patient will be medically stable for discharge from the hospital within 2 midnights of admission.   Author: Jonah Blue, MD 12/04/2023 3:39 PM  For on call review [email or web address].

## 2023-12-04 NOTE — ED Notes (Signed)
[place] with  cl called for transport 

## 2023-12-04 NOTE — ED Triage Notes (Signed)
He tells me that he chronically has occasional episodes of chest discomfort, which are "always relieved with one ntg". He tells me that he nearly fell this past Wed. At which time he was momentarily suspended by one arm. His skin is normal, warm and dry and he is breathing normally.

## 2023-12-04 NOTE — Progress Notes (Signed)
Patient arrived from [facility] ED to 4E for cardiac workup d/t chest pain and SOB.  Patient complaining of chest pain 8/10.  No other complaints by patient.  VSS.      12/04/23 1407  Vitals  Temp 98.8 F (37.1 C)  Temp Source Oral  BP 128/80  MAP (mmHg) 92  BP Location Right Arm  BP Method Automatic  Patient Position (if appropriate) Lying  Pulse Rate 76  Pulse Rate Source Monitor  ECG Heart Rate 76  Resp 19  Level of Consciousness  Level of Consciousness Alert  MEWS COLOR  MEWS Score Color Green  Oxygen Therapy  SpO2 98 %  O2 Device Room Air  Pain Assessment  Pain Scale 0-10  Pain Score 8  MEWS Score  MEWS Temp 0  MEWS Systolic 0  MEWS Pulse 0  MEWS RR 0  MEWS LOC 0  MEWS Score 0

## 2023-12-04 NOTE — ED Provider Notes (Signed)
[place] EMERGENCY DEPARTMENT AT [facility] Provider Note   CSN: [id number] Arrival date & time: 12/04/23  0744     History  Chief Complaint  Patient presents with   Chest Pain    Jeffery [name] is a 82 y.o. male.  Patient here with some chest pain shortness of breath.  He had a minor fall a few days ago where he had to hold on to prevent this on.  He is not sure if he may be injured a rib or pulled a muscle.  But he is having some pain with movement and taking deep breaths.  Maybe some chest discomfort.  Denies any major leg swelling recent surgery or travel.  He has a history of pacemaker, CHF, reflux, asthma, chronic bronchitis, diabetes.  Does not have any active pain now.  Nothing makes it worse or better.  Denies any fever chills cough or sputum production.  The history is provided by the patient.       Home Medications Prior to Admission medications   Medication Sig Start Date End Date Taking? Authorizing Provider  ACCU-CHEK AVIVA PLUS test strip USE AS INSTRUCTED TO TEST BLOOD SUGAR 6 TIMES DAILY. DX CODE: E11.59 12/17/19   Pincus Sanes, MD  ACCU-CHEK SOFTCLIX LANCETS lancets Use to test blood sugar daily as directed. 09/15/15   Pecola Lawless, MD  albuterol (PROVENTIL) (2.5 MG/3ML) 0.083% nebulizer solution TAKE 3 MLS BY NEBULIZATION EVERY 6 (SIX) HOURS AS NEEDED FOR WHEEZING OR SHORTNESS OF BREATH. 04/29/20   Pincus Sanes, MD  albuterol (VENTOLIN HFA) 108 (90 Base) MCG/ACT inhaler TAKE 2 PUFFS BY MOUTH EVERY 6 HOURS AS NEEDED FOR WHEEZE OR SHORTNESS OF BREATH 06/21/22   Pincus Sanes, MD  aspirin 81 MG tablet Take 81 mg by mouth at bedtime.    [provider]  celecoxib (CELEBREX) 100 MG capsule TAKE 1 CAPSULE BY MOUTH EVERY DAY 12/13/22   Judi Saa, DO  Continuous Blood Gluc Receiver (FREESTYLE LIBRE READER) DEVI 1 Device by Does not apply route every 14 (fourteen) days. Freestyle libre 3 reader 10/09/21   Pincus Sanes, MD  Continuous  Glucose Sensor (FREESTYLE LIBRE 14 DAY SENSOR) MISC APPLY 1 SENSOR EVERY 14 DAYS 06/07/23   Pincus Sanes, MD  dapagliflozin propanediol (FARXIGA) 10 MG TABS tablet Take 1 tablet (10 mg total) by mouth daily before breakfast. 06/09/23   Meriam Sprague, MD  famotidine (PEPCID) 20 MG tablet Take 1 tablet (20 mg total) by mouth daily. 12/13/18 09/03/21  Rolly Salter, MD  fexofenadine (ALLEGRA) 180 MG tablet Take 180 mg by mouth daily as needed for allergies.     [provider]  gabapentin (NEURONTIN) 100 MG capsule TAKE 2 CAPSULES BY MOUTH 3 TIMES DAILY 02/23/23   Judi Saa, DO  HYDROcodone-acetaminophen (NORCO) 7.5-325 MG tablet Take 1 tablet by mouth every 6 (six) hours as needed for severe pain (pain score 7-10) (for chronic joint pain, neck pain). 11/28/23   Burns, Bobette Mo, MD  LORazepam (ATIVAN) 1 MG tablet TAKE 1/2 TO 1 TABLET BY MOUTH NIGHTLY AS NEEDED. 09/20/23   Burns, Bobette Mo, MD  metFORMIN (GLUCOPHAGE) 500 MG tablet TAKE 1 TABLET BY MOUTH WITH  BREAKFAST AND 2 TABLETS BY MOUTH WITH DINNER 08/29/23   Burns, Bobette Mo, MD  montelukast (SINGULAIR) 10 MG tablet TAKE 1 TABLET BY MOUTH AT  BEDTIME 07/19/23   Pincus Sanes, MD  Multiple Vitamin (MULTIVITAMIN WITH MINERALS) TABS tablet  Take 1 tablet by mouth daily.    [provider]  nebivolol (BYSTOLIC) 5 MG tablet Take 1 tablet (5 mg total) by mouth daily. 08/23/23   Marinus Maw, MD  nitroGLYCERIN (NITROSTAT) 0.4 MG SL tablet Place 1 tablet (0.4 mg total) under the tongue every 5 (five) minutes x 3 doses as needed for chest pain. 03/22/23   Meriam Sprague, MD  Respiratory Therapy Supplies (FLUTTER) DEVI Twice a day and prn as needed, may increase if feeling worse 01/16/21   Coral Ceo, NP  rosuvastatin (CRESTOR) 10 MG tablet TAKE 1 TABLET ON MONDAY, WEDNESDAY & FRIDAY. 08/16/23   Weaver, Scott T, PA-C  SYMBICORT 160-4.5 MCG/ACT inhaler USE 1 TO 2 INHALATIONS BY  MOUTH EVERY 12 HOURS AS  NEEDED 03/08/23   Pincus Sanes,  MD  vardenafil (LEVITRA) 20 MG tablet TAKE 1 TABLET BY MOUTH AS DIRECTED. CAN NOT BE TAKEN WITH NITROGLYCERIN. NOT COVERED BY INSURANCE 10/13/23   Pincus Sanes, MD  zinc gluconate 50 MG tablet Take 50 mg by mouth daily.    [provider]      Allergies    Contrast media [iodinated contrast media]    Review of Systems   Review of Systems  Physical Exam Updated Vital Signs BP 128/80 (BP Location: Right Arm)   Pulse 76   Temp 98.8 F (37.1 C) (Oral)   Resp 19   SpO2 98%  Physical Exam Vitals and nursing note reviewed.  Constitutional:      General: He is not in acute distress.    Appearance: He is well-developed. He is not ill-appearing.  HENT:     Head: Normocephalic and atraumatic.  Eyes:     Extraocular Movements: Extraocular movements intact.     Conjunctiva/sclera: Conjunctivae normal.     Pupils: Pupils are equal, round, and reactive to light.  Cardiovascular:     Rate and Rhythm: Normal rate and regular rhythm.     Pulses:          Radial pulses are 2+ on the right side and 2+ on the left side.     Heart sounds: Normal heart sounds. No murmur heard. Pulmonary:     Effort: Pulmonary effort is normal. No respiratory distress.     Breath sounds: No decreased breath sounds, wheezing or rhonchi.  Abdominal:     Palpations: Abdomen is soft.     Tenderness: There is no abdominal tenderness.  Musculoskeletal:        General: No swelling. Normal range of motion.     Cervical back: Normal range of motion and neck supple.     Right lower leg: No edema.     Left lower leg: No edema.  Skin:    General: Skin is warm and dry.     Capillary Refill: Capillary refill takes less than 2 seconds.  Neurological:     General: No focal deficit present.     Mental Status: He is alert.  Psychiatric:        Mood and Affect: Mood normal.     ED Results / Procedures / Treatments   Labs (all labs ordered are listed, but only abnormal results are displayed) Labs Reviewed   BASIC METABOLIC PANEL - Abnormal; Notable for the following components:      Result Value   Glucose, Bld 124 (*)    BUN 24 (*)    All other components within normal limits  CBC - Abnormal; Notable for the following  components:   WBC 12.0 (*)    Hemoglobin 12.8 (*)    All other components within normal limits  BRAIN NATRIURETIC PEPTIDE - Abnormal; Notable for the following components:   B Natriuretic Peptide 529.3 (*)    All other components within normal limits  D-DIMER, QUANTITATIVE - Abnormal; Notable for the following components:   D-Dimer, Quant 0.56 (*)    All other components within normal limits  TROPONIN I (HIGH SENSITIVITY) - Abnormal; Notable for the following components:   Troponin I (High Sensitivity) 29 (*)    All other components within normal limits  TROPONIN I (HIGH SENSITIVITY) - Abnormal; Notable for the following components:   Troponin I (High Sensitivity) 27 (*)    All other components within normal limits  RESP PANEL BY RT-PCR (RSV, FLU A&B, COVID)  [id number]    EKG EKG Interpretation Date/Time:  Sunday December 04 2023 07:55:18 EST Ventricular Rate:  80 PR Interval:  240 QRS Duration:  156 QT Interval:  438 QTC Calculation: 505 R Axis:   219  Text Interpretation: Atrial-sensed ventricular-paced rhythm with prolonged AV conduction with occasional Premature ventricular complexes Abnormal ECG Confirmed by Virgina Norfolk [phone]) on 12/04/2023 7:59:16 AM  Radiology DG Chest Port 1 View Result Date: 12/04/2023 CLINICAL DATA:  [id number] Chest pain [id number] EXAM: PORTABLE CHEST 1 VIEW COMPARISON:  12/11/2018 chest radiograph. FINDINGS: Stable configuration of 3 lead left subclavian pacemaker with lead tips overlying the right atrium, right ventricle and coronary sinus. Stable cardiomediastinal silhouette with normal heart size. No pneumothorax. No pleural effusion. Lungs appear clear, with no acute consolidative airspace disease and no pulmonary edema. IMPRESSION: No  active disease. Electronically Signed   By: Delbert Phenix M.D.   On: 12/04/2023 09:06    Procedures Procedures    Medications Ordered in ED Medications  furosemide (LASIX) injection 20 mg (20 mg Intravenous Given 12/04/23 1154)    ED Course/ Medical Decision Making/ A&P                                 Medical Decision Making Amount and/or Complexity of Data Reviewed Labs: ordered. Radiology: ordered.  Risk Prescription drug management. Decision regarding hospitalization.   Serena Colonel Srinivasan is here with shortness of breath on exertion some chest pain.  History of heart failure, pacemaker history of the last few days.  He thought maybe he pulled a chest wall muscle after somewhat of a fall a few days ago but he is having now exertional shortness of breath chest pain at times.  Little bit worse when takes a deep breath.  Not reproducible on exam.  Differential diagnosis could be heart failure or PE or ACS or valvular heart disease.  He looks comfortable on exam however.  Clear breath sounds.  Does have chronic bronchitis but he does not have cough or sputum production or wheezing on exam.  Will get CBC BMP troponin D-dimer BNP chest x-ray EKG.  EKG showed paced rhythm.  Troponin 27 and 29.  BNP is elevated from his baseline to around 600.  D-dimer is age-adjusted normal.  Chest x-ray shows no evidence of pneumonia pneumothorax or volume overload.  I reviewed and interpreted labs and imaging.  He has no significant anemia or electrolyte abnormality or kidney injury.  Overall I do think he could be retaining a little bit of fluid.  Seems less likely to be ACS.  I will give him a dose IV  Lasix and have admitted for echocardiogram and further telemetry and further cardiac workup.  Patient admitted to medicine service for further care.  This chart was dictated using voice recognition software.  Despite best efforts to proofread,  errors can occur which can change the documentation meaning.          Final Clinical Impression(s) / ED Diagnoses Final diagnoses:  Chest pain, unspecified type  SOB (shortness of breath)    Rx / DC Orders ED Discharge Orders     None         Virgina Norfolk, DO 12/04/23 1442

## 2023-12-04 NOTE — Care Management Obs Status (Signed)
MEDICARE OBSERVATION STATUS NOTIFICATION   Patient Details  Name: Jeffery [name] MRN: [id number] Date of Birth: Mar 07, 1941   Medicare Observation Status Notification Given:  Yes    Ronny Bacon, RN 12/04/2023, 3:19 PM

## 2023-12-05 ENCOUNTER — Observation Stay (HOSPITAL_COMMUNITY): Payer: Medicare Other

## 2023-12-05 DIAGNOSIS — I5021 Acute systolic (congestive) heart failure: Secondary | ICD-10-CM

## 2023-12-05 DIAGNOSIS — R0602 Shortness of breath: Secondary | ICD-10-CM | POA: Diagnosis not present

## 2023-12-05 DIAGNOSIS — R0789 Other chest pain: Secondary | ICD-10-CM | POA: Diagnosis not present

## 2023-12-05 LAB — CBC
HCT: 38.3 % — ABNORMAL LOW (ref 39.0–52.0)
Hemoglobin: 12.3 g/dL — ABNORMAL LOW (ref 13.0–17.0)
MCH: 28.6 pg (ref 26.0–34.0)
MCHC: 32.1 g/dL (ref 30.0–36.0)
MCV: 89.1 fL (ref 80.0–100.0)
Platelets: 236 10*3/uL (ref 150–400)
RBC: 4.3 MIL/uL (ref 4.22–5.81)
RDW: 14 % (ref 11.5–15.5)
WBC: 12.8 10*3/uL — ABNORMAL HIGH (ref 4.0–10.5)
nRBC: 0 % (ref 0.0–0.2)

## 2023-12-05 LAB — ECHOCARDIOGRAM COMPLETE
Area-P 1/2: 4.6 cm2
Calc EF: 45.3 %
S' Lateral: 4.6 cm
Single Plane A2C EF: 43.2 %
Single Plane A4C EF: 46.6 %
Weight: 2878.33 [oz_av]

## 2023-12-05 LAB — BASIC METABOLIC PANEL
Anion gap: 12 (ref 5–15)
BUN: 22 mg/dL (ref 8–23)
CO2: 23 mmol/L (ref 22–32)
Calcium: 9 mg/dL (ref 8.9–10.3)
Chloride: 99 mmol/L (ref 98–111)
Creatinine, Ser: 1.16 mg/dL (ref 0.61–1.24)
GFR, Estimated: >60 mL/min (ref >60)
Glucose, Bld: 170 mg/dL — ABNORMAL HIGH (ref 70–99)
Potassium: 4.6 mmol/L (ref 3.5–5.1)
Sodium: 134 mmol/L — ABNORMAL LOW (ref 135–145)

## 2023-12-05 LAB — C-REACTIVE PROTEIN: CRP: 25.9 mg/dL — ABNORMAL HIGH (ref <1.0)

## 2023-12-05 LAB — GLUCOSE, CAPILLARY
Glucose-Capillary: 157 mg/dL — ABNORMAL HIGH (ref 70–99)
Glucose-Capillary: 142 mg/dL — ABNORMAL HIGH (ref 70–99)
Glucose-Capillary: 101 mg/dL — ABNORMAL HIGH (ref 70–99)

## 2023-12-05 LAB — PROCALCITONIN: Procalcitonin: 2.04 ng/mL

## 2023-12-05 LAB — SEDIMENTATION RATE: Sed Rate: 20 mm/h — ABNORMAL HIGH (ref 0–16)

## 2023-12-05 MED ORDER — ASPIRIN 81 MG PO TBEC: 81.0000 mg | DELAYED_RELEASE_TABLET | Freq: Every day | ORAL | 12 refills | Status: DC

## 2023-12-05 MED ORDER — IOHEXOL 350 MG/ML SOLN
75.0000 mL | Freq: Once | INTRAVENOUS | Status: AC | PRN
  Administered 2023-12-05: 75 mL via INTRAVENOUS

## 2023-12-05 MED ORDER — ORAL CARE MOUTH RINSE: 15.0000 mL | OROMUCOSAL | Status: DC | PRN

## 2023-12-05 MED ORDER — AMOXICILLIN-POT CLAVULANATE 875-125 MG PO TABS: 1.0000 | ORAL_TABLET | Freq: Two times a day (BID) | ORAL | 0 refills | Status: DC

## 2023-12-05 MED ORDER — FUROSEMIDE 10 MG/ML IJ SOLN
20.0000 mg | Freq: Once | INTRAMUSCULAR | Status: AC
  Administered 2023-12-05: 20 mg via INTRAVENOUS
  Filled 2023-12-05: qty 2

## 2023-12-05 MED ORDER — FUROSEMIDE 20 MG PO TABS: 20.0000 mg | ORAL_TABLET | Freq: Every day | ORAL | 1 refills | Status: DC | PRN

## 2023-12-05 MED ORDER — AMOXICILLIN-POT CLAVULANATE 875-125 MG PO TABS: 1.0000 | ORAL_TABLET | Freq: Two times a day (BID) | ORAL | Status: DC

## 2023-12-05 MED ORDER — PERFLUTREN LIPID MICROSPHERE
1.0000 mL | INTRAVENOUS | Status: AC | PRN
  Administered 2023-12-05: 3 mL via INTRAVENOUS

## 2023-12-05 NOTE — Discharge Summary (Signed)
Physician Discharge Summary   Patient: Jeffery [name] MRN: [id number] DOB: Mar 23, 1941  Admit date:     12/04/2023  Discharge date: 12/05/23  Discharge Physician: Alberteen Sam   PCP: Pincus Sanes, MD     Recommendations at discharge:  Follow up with PCP Dr. Lawerance Bach for pneumonia in 1 week Follow up with Cardiology Dr. Allyson Sabal in 2 weeks Dr. Lawerance Bach: If SOB persists despite antibiotics, please update PFTs     Discharge Diagnoses: Principal Problem:   Shortness of breath, possible community acquired pneumonia Active Problems:   Type 2 diabetes mellitus with vascular disease (HCC)   HLD (hyperlipidemia)   Asthma   Essential hypertension   Chronic combined systolic and diastolic CHF (congestive heart failure) (HCC)   Pacemaker    Hospital Course: 82 y.o. M with sCHF EF 40-45%, HTN, COPD not on O2, hx PPM, DM, and OSA who presented with few weeks of exertional SOB with climbing stairs, then sharp pleuritic pain.  In the ER, CTA chest ruled out PE.  Did show atelectasis or linear infiltrates at bases.  Admitted for further work up.    Chest pain Suspected pleuritic chest pain from pneumonia Pleuritic in character.  CTA showed linear infiltrates and WBC and procalcitonin were slightly up.  PSI 70, low risk.  Discharged on oral antibiotics for 5 days, PCP follow up.  sCHF Cardiology consulted.  Obtained echo, showed unchanged EF, no new valvular disease.  Cardiology recommended no medications changes, felt that his chest pain and shortness of breath were noncardiac.  He has outpatient follow up pending.            The [facility] Controlled Substances Registry was reviewed for this patient prior to discharge.   Consultants: Cardiology   Disposition: Home Diet recommendation:  Discharge Diet Orders (From admission, onward)     Start     Ordered   12/05/23 0000  Diet - low sodium heart healthy        12/05/23 1726             DISCHARGE  MEDICATION: Allergies as of 12/05/2023       Reactions   Contrast Media [iodinated Contrast Media] Other (See Comments)   Redness and warm sensation, this reaction was noted on 02/27/13 during a Cardiac MRI per pt.  Pt sts he had erythema on his head, chest, and shoulders.  Pt had a pacemaker placed August 2019 and was pre-medicated for the dye and had no allergic reaction at that time. -Carissa Mozingo B.S. RT(R)(CT)        Medication List     STOP taking these medications    celecoxib 100 MG capsule Commonly known as: CELEBREX       TAKE these medications    Accu-Chek Aviva Plus test strip Generic drug: glucose blood USE AS INSTRUCTED TO TEST BLOOD SUGAR 6 TIMES DAILY. DX CODE: E11.59   Accu-Chek Softclix Lancets lancets Use to test blood sugar daily as directed.   albuterol (2.5 MG/3ML) 0.083% nebulizer solution Commonly known as: PROVENTIL TAKE 3 MLS BY NEBULIZATION EVERY 6 (SIX) HOURS AS NEEDED FOR WHEEZING OR SHORTNESS OF BREATH.   albuterol 108 (90 Base) MCG/ACT inhaler Commonly known as: VENTOLIN HFA TAKE 2 PUFFS BY MOUTH EVERY 6 HOURS AS NEEDED FOR WHEEZE OR SHORTNESS OF BREATH   amoxicillin-clavulanate 875-125 MG tablet Commonly known as: AUGMENTIN Take 1 tablet by mouth every 12 (twelve) hours.   aspirin EC 81 MG tablet Take 1 tablet (81  mg total) by mouth at bedtime. Swallow whole.   dapagliflozin propanediol 10 MG Tabs tablet Commonly known as: Farxiga Take 1 tablet (10 mg total) by mouth daily before breakfast.   Flutter Devi Twice a day and prn as needed, may increase if feeling worse   FreeStyle Libre 14 Day Sensor Misc APPLY 1 SENSOR EVERY 14 DAYS   FreeStyle Libre Reader Devi 1 Device by Does not apply route every 14 (fourteen) days. Freestyle libre 3 reader   furosemide 20 MG tablet Commonly known as: Lasix Take 1 tablet (20 mg total) by mouth daily as needed (For swelling or edema).   HYDROcodone-acetaminophen 7.5-325 MG  tablet Commonly known as: NORCO Take 1 tablet by mouth every 6 (six) hours as needed for severe pain (pain score 7-10) (for chronic joint pain, neck pain).   LORazepam 1 MG tablet Commonly known as: ATIVAN TAKE 1/2 TO 1 TABLET BY MOUTH NIGHTLY AS NEEDED.   metFORMIN 500 MG tablet Commonly known as: GLUCOPHAGE TAKE 1 TABLET BY MOUTH WITH  BREAKFAST AND 2 TABLETS BY MOUTH WITH DINNER   montelukast 10 MG tablet Commonly known as: SINGULAIR TAKE 1 TABLET BY MOUTH AT  BEDTIME   nebivolol 5 MG tablet Commonly known as: BYSTOLIC Take 1 tablet (5 mg total) by mouth daily.   nitroGLYCERIN 0.4 MG SL tablet Commonly known as: NITROSTAT Place 1 tablet (0.4 mg total) under the tongue every 5 (five) minutes x 3 doses as needed for chest pain.   rosuvastatin 10 MG tablet Commonly known as: CRESTOR TAKE 1 TABLET ON MONDAY, WEDNESDAY & FRIDAY. What changed:  how much to take how to take this when to take this   Symbicort 160-4.5 MCG/ACT inhaler Generic drug: budesonide-formoterol USE 1 TO 2 INHALATIONS BY  MOUTH EVERY 12 HOURS AS  NEEDED What changed: See the new instructions.   vardenafil 20 MG tablet Commonly known as: LEVITRA TAKE 1 TABLET BY MOUTH AS DIRECTED. CAN NOT BE TAKEN WITH NITROGLYCERIN. NOT COVERED BY INSURANCE        Follow-up Information     Pincus Sanes, MD. Schedule an appointment as soon as possible for a visit in 1 week(s).   Specialty: Internal Medicine Contact information: [address] [place] [address] [phone]                 Discharge Instructions     Diet - low sodium heart healthy   Complete by: As directed    Discharge instructions   Complete by: As directed    **IMPORTANT DISCHARGE INSTRUCTIONS**   From Dr. Maryfrances Bunnell: You were admitted for chest discomfort and trouble breathing.  Here, you were evaluated by Cardiology.  They repeated your echocardiogram, which showed stable reduced systolic and diastolic function but  no change and no concerning findings.  Your chest pain may be from pleuritis (which is not really treatable but also is not dangerous and goes away by itself) or it may be from pneumonia  In case it is pneumonia, take Augmentin 875-125 mg twice daily for 5 days (ending 12/27) and go see Dr. Lawerance Bach in 1 week for hospital follow up  Resume your other home medicines  Go see Dr. Allyson Sabal in 1 week The heart team here recommends you start a new low dose aspirin 81 mg daily   Increase activity slowly   Complete by: As directed        Discharge Exam: Filed Weights   12/04/23 0958  Weight: 81.6 kg  General: Pt is alert, awake, not in acute distress Cardiovascular: RRR, nl S1-S2, no murmurs appreciated.   No LE edema.   Respiratory: Normal respiratory rate and rhythm.  CTAB without rales or wheezes. Abdominal: Abdomen soft and non-tender.  No distension or HSM.   Neuro/Psych: Strength symmetric in upper and lower extremities.  Judgment and insight appear normal.   Condition at discharge: good  The results of significant diagnostics from this hospitalization (including imaging, microbiology, ancillary and laboratory) are listed below for reference.   Imaging Studies: ECHOCARDIOGRAM COMPLETE Result Date: 12/05/2023    ECHOCARDIOGRAM REPORT   Patient Name:   Jeziel Karie Schwalbe [facility] Date of Exam: 12/05/2023 Medical Rec #:  [id number]          Height:       69.5 in Accession #:    [id number]         Weight:       179.9 lb Date of Birth:  10-Jul-1941           BSA:          1.985 m Patient Age:    82 years           BP:           110/61 mmHg Patient Gender: M                  HR:           69 bpm. Exam Location:  Inpatient Procedure: 2D Echo, Cardiac Doppler, Color Doppler and Intracardiac            Opacification Agent Indications:    CHF I50.21  History:        Patient has prior history of Echocardiogram examinations, most                 recent 04/06/2022.  Sonographer:    Harriette Bouillon RDCS Referring  Phys: [id number] ANGELA NICOLE DUKE IMPRESSIONS  1. Left ventricular ejection fraction, by estimation, is 40 to 45%. The left ventricle has mildly decreased function. The left ventricle demonstrates global hypokinesis. Left ventricular diastolic parameters are consistent with Grade II diastolic dysfunction (pseudonormalization).  2. Right ventricular systolic function is mildly reduced. The right ventricular size is normal.  3. Left atrial size was moderately dilated.  4. The mitral valve is normal in structure. No evidence of mitral valve regurgitation.  5. The aortic valve is normal in structure. Aortic valve regurgitation is not visualized. FINDINGS  Left Ventricle: Left ventricular ejection fraction, by estimation, is 40 to 45%. The left ventricle has mildly decreased function. The left ventricle demonstrates global hypokinesis. Definity contrast agent was given IV to delineate the left ventricular  endocardial borders. The left ventricular internal cavity size was normal in size. There is no left ventricular hypertrophy. Left ventricular diastolic parameters are consistent with Grade II diastolic dysfunction (pseudonormalization). Right Ventricle: The right ventricular size is normal. No increase in right ventricular wall thickness. Right ventricular systolic function is mildly reduced. Left Atrium: Left atrial size was moderately dilated. Right Atrium: Right atrial size was normal in size. Pericardium: Trivial pericardial effusion is present. Mitral Valve: The mitral valve is normal in structure. No evidence of mitral valve regurgitation. Tricuspid Valve: The tricuspid valve is normal in structure. Tricuspid valve regurgitation is not demonstrated. Aortic Valve: The aortic valve is normal in structure. Aortic valve regurgitation is not visualized. Pulmonic Valve: The pulmonic valve was normal in structure. Pulmonic valve regurgitation is trivial. Aorta: The aortic root,  ascending aorta and aortic arch are all  structurally normal, with no evidence of dilitation or obstruction. IAS/Shunts: No atrial level shunt detected by color flow Doppler.  LEFT VENTRICLE PLAX 2D LVIDd:         5.20 cm      Diastology LVIDs:         4.60 cm      LV e' medial:    3.59 cm/s LV PW:         0.90 cm      LV E/e' medial:  23.6 LV IVS:        0.90 cm      LV e' lateral:   6.53 cm/s LVOT diam:     2.30 cm      LV E/e' lateral: 13.0 LV SV:         61 LV SV Index:   31 LVOT Area:     4.15 cm  LV Volumes (MOD) LV vol d, MOD A2C: 130.0 ml LV vol d, MOD A4C: 152.0 ml LV vol s, MOD A2C: 73.8 ml LV vol s, MOD A4C: 81.2 ml LV SV MOD A2C:     56.2 ml LV SV MOD A4C:     152.0 ml LV SV MOD BP:      64.2 ml RIGHT VENTRICLE         IVC TAPSE (M-mode): 2.0 cm  IVC diam: 1.60 cm LEFT ATRIUM           Index        RIGHT ATRIUM           Index LA diam:      4.30 cm 2.17 cm/m   RA Area:     20.40 cm LA Vol (A4C): 79.0 ml 39.80 ml/m  RA Volume:   60.00 ml  30.23 ml/m  AORTIC VALVE LVOT Vmax:   77.40 cm/s LVOT Vmean:  54.300 cm/s LVOT VTI:    0.147 m  AORTA Ao Root diam: 2.90 cm Ao Asc diam:  3.10 cm MITRAL VALVE MV Area (PHT): 4.60 cm    SHUNTS MV Decel Time: 165 msec    Systemic VTI:  0.15 m MV E velocity: 84.60 cm/s  Systemic Diam: 2.30 cm MV A velocity: 58.20 cm/s MV E/A ratio:  1.45 Clearnce Hasten Electronically signed by Clearnce Hasten Signature Date/Time: 12/05/2023/5:20:19 PM    Final    CT Angio Chest Pulmonary Embolism (PE) W or WO Contrast Result Date: 12/05/2023 CLINICAL DATA:  Chest wall pain EXAM: CT ANGIOGRAPHY CHEST WITH CONTRAST TECHNIQUE: Multidetector CT imaging of the chest was performed using the standard protocol during bolus administration of intravenous contrast. Multiplanar CT image reconstructions and MIPs were obtained to evaluate the vascular anatomy. RADIATION DOSE REDUCTION: This exam was performed according to the departmental dose-optimization program which includes automated exposure control, adjustment of the mA and/or  kV according to patient size and/or use of iterative reconstruction technique. CONTRAST:  75mL OMNIPAQUE IOHEXOL 350 MG/ML SOLN COMPARISON:  06/26/2019 FINDINGS: Cardiovascular: No filling defects in the pulmonary arteries to suggest pulmonary emboli. Pacer wires in the right heart. Heart borderline in size. Aorta normal caliber. Mediastinum/Nodes: No mediastinal, hilar, or axillary adenopathy. Trachea and esophagus are unremarkable. Thyroid unremarkable. Lungs/Pleura: Linear and ground-glass opacities in the lung bases most likely atelectasis. No effusions or confluent opacities. Upper Abdomen: No acute findings Musculoskeletal: Chest wall soft tissues are unremarkable. No acute bony abnormality. Review of the MIP images confirms the above findings. IMPRESSION: No evidence of pulmonary  embolus. Bibasilar atelectasis or scarring. No chest wall mass or evidence of rib fracture. Electronically Signed   By: Charlett Nose M.D.   On: 12/05/2023 01:05   DG Chest Port 1 View Result Date: 12/04/2023 CLINICAL DATA:  [id number] Chest pain [id number] EXAM: PORTABLE CHEST 1 VIEW COMPARISON:  12/11/2018 chest radiograph. FINDINGS: Stable configuration of 3 lead left subclavian pacemaker with lead tips overlying the right atrium, right ventricle and coronary sinus. Stable cardiomediastinal silhouette with normal heart size. No pneumothorax. No pleural effusion. Lungs appear clear, with no acute consolidative airspace disease and no pulmonary edema. IMPRESSION: No active disease. Electronically Signed   By: Delbert Phenix M.D.   On: 12/04/2023 09:06    Microbiology: Results for orders placed or performed during the hospital encounter of 12/04/23  Resp panel by RT-PCR (RSV, Flu A&B, Covid) Anterior Nasal Swab     Status: None   Collection Time: 12/04/23 11:57 AM   Specimen: Anterior Nasal Swab  Result Value Ref Range Status   SARS Coronavirus 2 by RT PCR NEGATIVE NEGATIVE Final    Comment: (NOTE) SARS-CoV-2 target nucleic acids  are NOT DETECTED.  The SARS-CoV-2 RNA is generally detectable in upper respiratory specimens during the acute phase of infection. The lowest concentration of SARS-CoV-2 viral copies this assay can detect is 138 copies/mL. A negative result does not preclude SARS-Cov-2 infection and should not be used as the sole basis for treatment or other patient management decisions. A negative result may occur with  improper specimen collection/handling, submission of specimen other than nasopharyngeal swab, presence of viral mutation(s) within the areas targeted by this assay, and inadequate number of viral copies(<138 copies/mL). A negative result must be combined with clinical observations, patient history, and epidemiological information. The expected result is Negative.  Fact Sheet for Patients:  BloggerCourse.com  Fact Sheet for Healthcare Providers:  SeriousBroker.it  This test is no t yet approved or cleared by the [place] FDA and  has been authorized for detection and/or diagnosis of SARS-CoV-2 by FDA under an Emergency Use Authorization (EUA). This EUA will remain  in effect (meaning this test can be used) for the duration of the COVID-19 declaration under Section 564(b)(1) of the Act, 21 U.S.C.section 360bbb-3(b)(1), unless the authorization is terminated  or revoked sooner.       Influenza A by PCR NEGATIVE NEGATIVE Final   Influenza B by PCR NEGATIVE NEGATIVE Final    Comment: (NOTE) The Xpert Xpress SARS-CoV-2/FLU/RSV plus assay is intended as an aid in the diagnosis of influenza from Nasopharyngeal swab specimens and should not be used as a sole basis for treatment. Nasal washings and aspirates are unacceptable for Xpert Xpress SARS-CoV-2/FLU/RSV testing.  Fact Sheet for Patients: BloggerCourse.com  Fact Sheet for Healthcare Providers: SeriousBroker.it  This test is  not yet approved or cleared by the [place] FDA and has been authorized for detection and/or diagnosis of SARS-CoV-2 by FDA under an Emergency Use Authorization (EUA). This EUA will remain in effect (meaning this test can be used) for the duration of the COVID-19 declaration under Section 564(b)(1) of the Act, 21 U.S.C. section 360bbb-3(b)(1), unless the authorization is terminated or revoked.     Resp Syncytial Virus by PCR NEGATIVE NEGATIVE Final    Comment: (NOTE) Fact Sheet for Patients: BloggerCourse.com  Fact Sheet for Healthcare Providers: SeriousBroker.it  This test is not yet approved or cleared by the [place] FDA and has been authorized for detection and/or diagnosis of SARS-CoV-2 by FDA under an  Emergency Use Authorization (EUA). This EUA will remain in effect (meaning this test can be used) for the duration of the COVID-19 declaration under Section 564(b)(1) of the Act, 21 U.S.C. section 360bbb-3(b)(1), unless the authorization is terminated or revoked.  Performed at [facility], [address] [place], [place] [address]     Labs: CBC: Recent Labs  Lab 12/04/23 0824 12/05/23 0249  WBC 12.0* 12.8*  HGB 12.8* 12.3*  HCT 39.7 38.3*  MCV 89.8 89.1  PLT 244 236   Basic Metabolic Panel: Recent Labs  Lab 12/04/23 0824 12/05/23 0249  NA 140 134*  K 4.4 4.6  CL 103 99  CO2 29 23  GLUCOSE 124* 170*  BUN 24* 22  CREATININE 1.11 1.16  CALCIUM 9.9 9.0   Liver Function Tests: No results for input(s): "AST", "ALT", "ALKPHOS", "BILITOT", "PROT", "ALBUMIN" in the last 168 hours. CBG: Recent Labs  Lab 12/04/23 1639 12/04/23 2143 12/05/23 0615 12/05/23 1244 12/05/23 1700  GLUCAP 99 135* 157* 142* 101*    Discharge time spent: approximately 35 minutes spent on discharge counseling, evaluation of patient on day of discharge, and coordination of discharge planning with nursing,  social work, pharmacy and case management  Signed: Alberteen Sam, MD [facility] 12/05/2023

## 2023-12-05 NOTE — Progress Notes (Signed)
Pt underwent CT angio chest PE, got premedicate with Solu-medrol and Benadryl. No serious reactions after getting dye contrast media.   The CT result is negative evidence of pulmonary embolus. Bibasilar atelectasis or scarring.No chest wall mass or evidence of rib fracture.   He remains stable hemodynamically, AV-paced on the monitor, afebrile, normal respiratory rate and efforts, no SOB. He has complaints of mid-left posterior chest pain level 3-7/ 10 pain scale assessment. Pain is well tolerated and able to rest after Norco was given. He is able to ambulate independently in his room and hallway. No acute distress noted.   Plan of care is reviewed. We will continue to monitor.   Filiberto Pinks, RN

## 2023-12-05 NOTE — Plan of Care (Signed)
  Problem: Education: Goal: Knowledge of General Education information will improve Description: Including pain rating scale, medication(s)/side effects and non-pharmacologic comfort measures Outcome: Adequate for Discharge   Problem: Health Behavior/Discharge Planning: Goal: Ability to manage health-related needs will improve Outcome: Adequate for Discharge   Problem: Clinical Measurements: Goal: Ability to maintain clinical measurements within normal limits will improve Outcome: Adequate for Discharge Goal: Will remain free from infection Outcome: Progressing Goal: Diagnostic test results will improve Outcome: Progressing Goal: Respiratory complications will improve Outcome: Progressing Goal: Cardiovascular complication will be avoided Outcome: Progressing   Problem: Activity: Goal: Risk for activity intolerance will decrease Outcome: Adequate for Discharge   Problem: Nutrition: Goal: Adequate nutrition will be maintained Outcome: Adequate for Discharge   Problem: Coping: Goal: Level of anxiety will decrease Outcome: Adequate for Discharge   Problem: Elimination: Goal: Will not experience complications related to bowel motility Outcome: Adequate for Discharge   Problem: Pain Management: Goal: General experience of comfort will improve Outcome: Adequate for Discharge   Problem: Safety: Goal: Ability to remain free from injury will improve Outcome: Adequate for Discharge   Problem: Skin Integrity: Goal: Risk for impaired skin integrity will decrease Outcome: Adequate for Discharge   Problem: Education: Goal: Ability to describe self-care measures that may prevent or decrease complications (Diabetes Survival Skills Education) will improve Outcome: Adequate for Discharge

## 2023-12-05 NOTE — Progress Notes (Addendum)
Rounding Note    Patient Name: Jeffery [name] Date of Encounter: 12/05/2023  [facility] Cardiologist: Lyn Records III, MD (Inactive) --> Dr. Shari Prows --> Dr. Allyson Sabal (has not seen yet)  Subjective   Pt feels better this morning, but breathing not back to baseline. Describes pleuritic type chest pain.  He is a retired Scientist, clinical (histocompatibility and immunogenetics).  Inpatient Medications    Scheduled Meds:  aspirin EC  81 mg Oral QHS   docusate sodium  100 mg Oral BID   enoxaparin (LOVENOX) injection  40 mg Subcutaneous Q24H   famotidine  20 mg Oral Daily   furosemide  20 mg Intravenous Once   insulin aspart  0-15 Units Subcutaneous TID WC   insulin aspart  0-5 Units Subcutaneous QHS   mometasone-formoterol  2 puff Inhalation BID   montelukast  10 mg Oral QHS   nebivolol  5 mg Oral Daily   rosuvastatin  10 mg Oral Q M,W,F   sodium chloride flush  3 mL Intravenous Q12H   Continuous Infusions:  PRN Meds: acetaminophen **OR** acetaminophen, albuterol, bisacodyl, hydrALAZINE, HYDROcodone-acetaminophen, LORazepam, morphine injection, nitroGLYCERIN, ondansetron **OR** ondansetron (ZOFRAN) IV, mouth rinse, polyethylene glycol   Vital Signs    Vitals:   12/04/23 2335 12/05/23 0036 12/05/23 0421 12/05/23 0812  BP: 133/83 130/71 109/60 110/61  Pulse: 83 80 63 82  Resp: 19 16 12 16   Temp: 97.8 F (36.6 C) 98 F (36.7 C) 97.7 F (36.5 C) 98.1 F (36.7 C)  TempSrc: Axillary Oral Oral Oral  SpO2: 98% 100% 99% 99%  Weight:        Intake/Output Summary (Last 24 hours) at 12/05/2023 0946 Last data filed at 12/04/2023 2100 Gross per 24 hour  Intake 500 ml  Output --  Net 500 ml      12/04/2023    9:58 AM 08/23/2023    9:01 AM 08/16/2023    3:46 PM  Last 3 Weights  Weight (lbs) 179 lb 14.3 oz 179 lb 12.8 oz 181 lb 12.8 oz  Weight (kg) 81.6 kg 81.557 kg 82.464 kg      Telemetry    A sensed V paced - Personally Reviewed  ECG    No new tracings - Personally Reviewed  Physical Exam    GEN: No acute distress.   Neck: No JVD Cardiac: RRR, no murmurs, rubs, or gallops.  Respiratory: crackle in left base GI: Soft, nontender, non-distended  MS: No edema; No deformity. Neuro:  Nonfocal  Psych: Normal affect   Labs    High Sensitivity Troponin:   Recent Labs  Lab 12/04/23 0824 12/04/23 0954 12/04/23 1547 12/04/23 1808  TROPONINIHS 29* 27* 35* 31*     Chemistry Recent Labs  Lab 12/04/23 0824 12/05/23 0249  NA 140 134*  K 4.4 4.6  CL 103 99  CO2 29 23  GLUCOSE 124* 170*  BUN 24* 22  CREATININE 1.11 1.16  CALCIUM 9.9 9.0  GFRNONAA >60 >60  ANIONGAP 8 12    Lipids No results for input(s): "CHOL", "TRIG", "HDL", "LABVLDL", "LDLCALC", "CHOLHDL" in the last 168 hours.  Hematology Recent Labs  Lab 12/04/23 0824 12/05/23 0249  WBC 12.0* 12.8*  RBC 4.42 4.30  HGB 12.8* 12.3*  HCT 39.7 38.3*  MCV 89.8 89.1  MCH 29.0 28.6  MCHC 32.2 32.1  RDW 13.8 14.0  PLT 244 236   Thyroid No results for input(s): "TSH", "FREET4" in the last 168 hours.  BNP Recent Labs  Lab 12/04/23 0824  BNP  529.3*    DDimer  Recent Labs  Lab 12/04/23 1100  DDIMER 0.56*     Radiology    CT Angio Chest Pulmonary Embolism (PE) W or WO Contrast Result Date: 12/05/2023 CLINICAL DATA:  Chest wall pain EXAM: CT ANGIOGRAPHY CHEST WITH CONTRAST TECHNIQUE: Multidetector CT imaging of the chest was performed using the standard protocol during bolus administration of intravenous contrast. Multiplanar CT image reconstructions and MIPs were obtained to evaluate the vascular anatomy. RADIATION DOSE REDUCTION: This exam was performed according to the departmental dose-optimization program which includes automated exposure control, adjustment of the mA and/or kV according to patient size and/or use of iterative reconstruction technique. CONTRAST:  75mL OMNIPAQUE IOHEXOL 350 MG/ML SOLN COMPARISON:  06/26/2019 FINDINGS: Cardiovascular: No filling defects in the pulmonary arteries to suggest  pulmonary emboli. Pacer wires in the right heart. Heart borderline in size. Aorta normal caliber. Mediastinum/Nodes: No mediastinal, hilar, or axillary adenopathy. Trachea and esophagus are unremarkable. Thyroid unremarkable. Lungs/Pleura: Linear and ground-glass opacities in the lung bases most likely atelectasis. No effusions or confluent opacities. Upper Abdomen: No acute findings Musculoskeletal: Chest wall soft tissues are unremarkable. No acute bony abnormality. Review of the MIP images confirms the above findings. IMPRESSION: No evidence of pulmonary embolus. Bibasilar atelectasis or scarring. No chest wall mass or evidence of rib fracture. Electronically Signed   By: Charlett Nose M.D.   On: 12/05/2023 01:05   DG Chest Port 1 View Result Date: 12/04/2023 CLINICAL DATA:  [id number] Chest pain [id number] EXAM: PORTABLE CHEST 1 VIEW COMPARISON:  12/11/2018 chest radiograph. FINDINGS: Stable configuration of 3 lead left subclavian pacemaker with lead tips overlying the right atrium, right ventricle and coronary sinus. Stable cardiomediastinal silhouette with normal heart size. No pneumothorax. No pleural effusion. Lungs appear clear, with no acute consolidative airspace disease and no pulmonary edema. IMPRESSION: No active disease. Electronically Signed   By: Delbert Phenix M.D.   On: 12/04/2023 09:06    Cardiac Studies   Echo pending  Patient Profile     82 y.o. male with a hx of chronic combined systolic and diastolic heart failure, Stokes-Adams syncope, status post BiV PPM 2019, hypertension, hyperlipidemia, OSA not type 2 diabetes who is being seen for the evaluation of chest pain   Assessment & Plan    Chest pain DOE/SOB Acute on chronic systolic and diastolic heart failure Pt described DOE that progressed to SOB at rest and orthopnea. DOE started about 3 weeks ago and seemed to progress this past Sat. He could no longer take deep breaths associated with chest pain. On Sunday he experienced sharp  substernal chest pain that was relieved with NTG x 1. He also describes pleuritic type chest pain. He has a known cardiomyopathy with LVEF 40-45% on echo 03/2022. Sick contacts with grandchildren diagnosed with PNA. Leukocytosis of 12.8 (12.0).  - given pleuritic type chest pain, I will obtain CRP and sed rate - given known cardiomyopathy, I will obtain repeat echo - BNP 530 - has received 20 mg IV lasix, still with crackles in left base - will give an additional 20 mg IV lasix this morning - GDMT includes 5 mg bystolic   Nonobstructive CAD - by heart cath in 2002 - nuclear stress test 09/2017 with large fixed defect suspicious for prior MI but no eversible ischemia, difficult with LBBB   PPM in place for Stokes Adams syncope - followed by EP - A sensed V paced on telemetry   Should echo return abnormal / EF further  reduced, would recommend right and left heart cath. Should CRP/sed rate return abnormal, will treat for likely viral pericarditis given sick contacts at home.      For questions or updates, please contact [facility] Please consult [email or web address] for contact info under      Signed, Marcelino Duster, PA  12/05/2023, 9:46 AM    History and all data above reviewed.  Patient examined.  I agree with the findings as above.   He has no chest pain.  No SOB.  Echo final pending but prelim looks like the EF is unchanged.  The patient exam reveals COR:RRR  ,  Lungs: Clear  ,  Abd: Positive bowel sounds, no rebound no guarding, Ext no edema  .  All available labs, radiology testing, previous records reviewed. Agree with documented assessment and plan.   DOE:  I would continue Lasix 20 mg po daily for 2 - 3 days and then use PRN.  No ischemia work up.  I am not suspecting an acute ischemic event.  Trop was low flat. Keep appt with Dr. Allyson Sabal.   Fayrene Fearing Kea Callan  1:51 PM  12/05/2023

## 2023-12-06 ENCOUNTER — Other Ambulatory Visit: Payer: Self-pay

## 2023-12-07 ENCOUNTER — Encounter: Payer: Self-pay | Admitting: Internal Medicine

## 2023-12-09 NOTE — Telephone Encounter (Signed)
Spoke with patient and he has a cardiac appt on 12/20/23 and will make a follow up appt if the cardiologist advises it.

## 2023-12-12 ENCOUNTER — Other Ambulatory Visit: Payer: Self-pay

## 2023-12-13 ENCOUNTER — Other Ambulatory Visit: Payer: Self-pay | Admitting: Internal Medicine

## 2023-12-20 ENCOUNTER — Ambulatory Visit: Payer: Medicare Other | Admitting: Internal Medicine

## 2023-12-20 ENCOUNTER — Ambulatory Visit: Payer: HMO | Attending: Cardiovascular Disease | Admitting: Cardiovascular Disease

## 2023-12-20 VITALS — BP 108/66 | HR 67 | Ht 69.0 in | Wt 170.0 lb

## 2023-12-20 DIAGNOSIS — I10 Essential (primary) hypertension: Secondary | ICD-10-CM

## 2023-12-20 DIAGNOSIS — E782 Mixed hyperlipidemia: Secondary | ICD-10-CM

## 2023-12-20 DIAGNOSIS — I2510 Atherosclerotic heart disease of native coronary artery without angina pectoris: Secondary | ICD-10-CM | POA: Diagnosis not present

## 2023-12-20 DIAGNOSIS — I5042 Chronic combined systolic (congestive) and diastolic (congestive) heart failure: Secondary | ICD-10-CM

## 2023-12-20 DIAGNOSIS — Z950 Presence of cardiac pacemaker: Secondary | ICD-10-CM

## 2023-12-20 DIAGNOSIS — I447 Left bundle-branch block, unspecified: Secondary | ICD-10-CM

## 2023-12-20 MED ORDER — DAPAGLIFLOZIN PROPANEDIOL 10 MG PO TABS: 10.0000 mg | ORAL_TABLET | Freq: Every day | ORAL | 3 refills | Status: AC

## 2023-12-20 MED ORDER — NEBIVOLOL HCL 5 MG PO TABS: 5.0000 mg | ORAL_TABLET | Freq: Every day | ORAL | 3 refills | Status: DC

## 2023-12-20 MED ORDER — ROSUVASTATIN CALCIUM 10 MG PO TABS: ORAL_TABLET | ORAL | 3 refills | Status: AC

## 2023-12-20 NOTE — Assessment & Plan Note (Signed)
 Chronic.

## 2023-12-20 NOTE — Assessment & Plan Note (Signed)
 History of essential hypertension her blood pressure measured today at 108/66.  He is on Bystolic.

## 2023-12-20 NOTE — Assessment & Plan Note (Signed)
 Catheterization in 2002 showed mild nonobstructive CAD.

## 2023-12-20 NOTE — Assessment & Plan Note (Signed)
 History of hyperlipidemia on statin therapy lipid profile performed 08/23/2023 revealing total cholesterol 97, LDL 45 and HDL 40.

## 2023-12-20 NOTE — Progress Notes (Signed)
 12/20/2023 Garlen Reinig [place]   09-28-1941  [id number]  Primary Physician Court Dorn PARAS, MD Primary Cardiologist: Dorn PARAS Court MD GENI CODY [place], [place]  HPI:  Jeffery [name] is a 83 y.o. 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  He trained the [facility] [place] .  He was previously a patient of Dr. Claudene.  His risk factors include treated hypertension, diabetes and hyperlipidemia.   He had a heart cath in 2002 that showed mild nonobstructive CAD.  He has chronic combined systolic and diastolic heart failure with an EF in the 40% range as well as chronic left bundle branch block.  He had a biventricular pacemaker placed by Dr. Waddell 07/25/2018 for second-degree AV block and Stokes-Adams syncope.  He was recently hospitalized for pneumonia.   Current Meds  Medication Sig   ACCU-CHEK AVIVA PLUS test strip USE AS INSTRUCTED TO TEST BLOOD SUGAR 6 TIMES DAILY. DX CODE: E11.59   ACCU-CHEK SOFTCLIX LANCETS lancets Use to test blood sugar daily as directed.   albuterol  (PROVENTIL ) (2.5 MG/3ML) 0.083% nebulizer solution TAKE 3 MLS BY NEBULIZATION EVERY 6 (SIX) HOURS AS NEEDED FOR WHEEZING OR SHORTNESS OF BREATH.   albuterol  (VENTOLIN  HFA) 108 (90 Base) MCG/ACT inhaler TAKE 2 PUFFS BY MOUTH EVERY 6 HOURS AS NEEDED FOR WHEEZE OR SHORTNESS OF BREATH   amoxicillin -clavulanate (AUGMENTIN ) 875-125 MG tablet Take 1 tablet by mouth every 12 (twelve) hours.   aspirin  EC 81 MG tablet Take 1 tablet (81 mg total) by mouth at bedtime. Swallow whole.   Continuous Blood Gluc Receiver (FREESTYLE LIBRE READER) DEVI 1 Device by Does not apply route every 14 (fourteen) days. Freestyle libre 3 reader   Continuous Glucose Sensor (FREESTYLE LIBRE 14 DAY SENSOR) MISC APPLY 1 SENSOR EVERY 14 DAYS   dapagliflozin  propanediol (FARXIGA ) 10 MG TABS tablet Take 1 tablet (10 mg total) by mouth daily before  breakfast.   furosemide  (LASIX ) 20 MG tablet Take 1 tablet (20 mg total) by mouth daily as needed (For swelling or edema).   HYDROcodone -acetaminophen  (NORCO) 7.5-325 MG tablet Take 1 tablet by mouth every 6 (six) hours as needed for severe pain (pain score 7-10) (for chronic joint pain, neck pain).   LORazepam  (ATIVAN ) 1 MG tablet TAKE 1/2 TO 1 TABLET BY MOUTH NIGHTLY AS NEEDED.   metFORMIN  (GLUCOPHAGE ) 500 MG tablet TAKE 1 TABLET BY MOUTH TWICE A DAY WITH FOOD   montelukast  (SINGULAIR ) 10 MG tablet TAKE 1 TABLET BY MOUTH AT  BEDTIME   nebivolol  (BYSTOLIC ) 5 MG tablet Take 1 tablet (5 mg total) by mouth daily.   nitroGLYCERIN  (NITROSTAT ) 0.4 MG SL tablet Place 1 tablet (0.4 mg total) under the tongue every 5 (five) minutes x 3 doses as needed for chest pain.   Respiratory Therapy Supplies (FLUTTER) DEVI Twice a day and prn as needed, may increase if feeling worse   rosuvastatin  (CRESTOR ) 10 MG tablet TAKE 1 TABLET ON MONDAY, WEDNESDAY & FRIDAY. (Patient taking differently: Take 10 mg by mouth daily. TAKE 1 TABLET ON MONDAY, WEDNESDAY & FRIDAY.)   SYMBICORT  160-4.5 MCG/ACT inhaler USE 1 TO 2 INHALATIONS BY  MOUTH EVERY 12 HOURS AS  NEEDED (Patient taking differently: Inhale 2 puffs into the lungs in the morning and at bedtime.)   vardenafil  (LEVITRA ) 20 MG tablet TAKE 1 TABLET BY MOUTH AS DIRECTED. CAN NOT BE TAKEN WITH NITROGLYCERIN . NOT COVERED BY INSURANCE  Allergies  Allergen Reactions   Contrast Media [Iodinated Contrast Media] Other (See Comments)    Redness and warm sensation, this reaction was noted on 02/27/13 during a Cardiac MRI per pt.  Pt sts he had erythema on his head, chest, and shoulders.  Pt had a pacemaker placed August 2019 and was pre-medicated for the dye and had no allergic reaction at that time. -Carissa Mozingo B.S. RT(R)(CT)    Social History   Socioeconomic History   Marital status: Married    Spouse name: Not on file   Number of children: 3   Years of  education: Not on file   Highest education level: Professional school degree (e.g., MD, DDS, DVM, JD)  Occupational History   Occupation: retired Scientist, Forensic: [employer]  Tobacco Use   Smoking status: Former    Current packs/day: 0.00    Average packs/day: 2.0 packs/day for 2.0 years (4.0 ttl pk-yrs)    Types: Cigarettes    Start date: 12/13/1961    Quit date: 12/14/1963    Years since quitting: 60.0   Smokeless tobacco: Never  Vaping Use   Vaping status: Never Used  Substance and Sexual Activity   Alcohol use: Not Currently    Alcohol/week: 2.0 standard drinks of alcohol    Types: 2 Glasses of wine per week    Comment: occasional   Drug use: Never   Sexual activity: Yes  Other Topics Concern   Not on file  Social History Narrative   Lives with wife in a 2 story home.  Has 3 daughters.  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     Social Drivers of Corporate Investment Banker Strain: Low Risk  (06/07/2023)   Overall Financial Resource Strain (CARDIA)    Difficulty of Paying Living Expenses: Not hard at all  Food Insecurity: No Food Insecurity (12/04/2023)   Hunger Vital Sign    Worried About Running Out of Food in the Last Year: Never true    Ran Out of Food in the Last Year: Never true  Transportation Needs: No Transportation Needs (12/04/2023)   PRAPARE - Administrator, Civil Service (Medical): No    Lack of Transportation (Non-Medical): No  Physical Activity: Sufficiently Active (06/07/2023)   Exercise Vital Sign    Days of Exercise per Week: 5 days    Minutes of Exercise per Session: 30 min  Stress: No Stress Concern Present (06/07/2023)   [employer] of Occupational Health - Occupational Stress Questionnaire    Feeling of Stress : Not at all  Social Connections: Socially Integrated (06/07/2023)   Social Connection and Isolation Panel [NHANES]    Frequency of Communication with Friends and Family: More than three times a week     Frequency of Social Gatherings with Friends and Family: More than three times a week    Attends Religious Services: More than 4 times per year    Active Member of [employer] or Organizations: Yes    Attends Engineer, Structural: More than 4 times per year    Marital Status: Married  Catering Manager Violence: Not At Risk (12/04/2023)   Humiliation, Afraid, Rape, and Kick questionnaire    Fear of Current or Ex-Partner: No    Emotionally Abused: No    Physically Abused: No    Sexually Abused: No     Review of Systems: General: negative for chills, fever, night sweats or weight changes.  Cardiovascular: negative for chest pain, dyspnea on  exertion, edema, orthopnea, palpitations, paroxysmal nocturnal dyspnea or shortness of breath Dermatological: negative for rash Respiratory: negative for cough or wheezing Urologic: negative for hematuria Abdominal: negative for nausea, vomiting, diarrhea, bright red blood per rectum, melena, or hematemesis Neurologic: negative for visual changes, syncope, or dizziness All other systems reviewed and are otherwise negative except as noted above.    Blood pressure 108/66, pulse 67, height 5' 9 (1.753 m), weight 170 lb (77.1 kg), SpO2 98%.  General appearance: alert and no distress Neck: no adenopathy, no carotid bruit, no JVD, supple, symmetrical, trachea midline, and thyroid  not enlarged, symmetric, no tenderness/mass/nodules Lungs: clear to auscultation bilaterally Heart: regular rate and rhythm, S1, S2 normal, no murmur, click, rub or gallop Extremities: extremities normal, atraumatic, no cyanosis or edema Pulses: 2+ and symmetric Skin: Skin color, texture, turgor normal. No rashes or lesions Neurologic: Grossly normal  EKG not performed today      ASSESSMENT AND PLAN:   HLD (hyperlipidemia) History of hyperlipidemia on statin therapy lipid profile performed 08/23/2023 revealing total cholesterol 97, LDL 45 and HDL 40.  Coronary  atherosclerosis Catheterization in 2002 showed mild nonobstructive CAD.  Left bundle branch block Chronic  Essential hypertension History of essential hypertension her blood pressure measured today at 108/66.  He is on Bystolic .  Chronic combined systolic and diastolic CHF (congestive heart failure) (HCC) History of combined systolic and diastolic heart failure with EF by recent echo performed 12/05/2023 of 40 to 45% with grade 2 diastolic dysfunction and no significant valvular abnormalities.  He is on a diuretic and a beta-blocker.  He is completely asymptomatic.  Pacemaker History of Stokes-Adams and secondary AV block status post permanent transvenous pacemaker placed by Dr. Waddell 07/25/2018 [facility] Jude BiV pacemaker).  Dr. Waddell follows this as an outpatient.     Dorn DOROTHA Lesches MD FACP,FACC,FAHA, [facility] 12/20/2023 9:58 AM

## 2023-12-20 NOTE — Patient Instructions (Signed)

## 2023-12-20 NOTE — Assessment & Plan Note (Signed)
 History of combined systolic and diastolic heart failure with EF by recent echo performed 12/05/2023 of 40 to 45% with grade 2 diastolic dysfunction and no significant valvular abnormalities.  He is on a diuretic and a beta-blocker.  He is completely asymptomatic.

## 2023-12-20 NOTE — Assessment & Plan Note (Signed)
 History of Stokes-Adams and secondary AV block status post permanent transvenous pacemaker placed by Dr. Ladona Ridgel 07/25/2018 [employer] Jude BiV pacemaker).  Dr. Ladona Ridgel follows this as an outpatient.

## 2023-12-28 ENCOUNTER — Encounter: Payer: Self-pay | Admitting: Internal Medicine

## 2023-12-28 NOTE — Progress Notes (Signed)
Subjective:    Patient ID: Jeffery [name], male    DOB: 25-Dec-1940, 83 y.o.   MRN: [id number]     HPI Jeffery [name] is here for follow up from the hospital  Admitted 12/22-12/23  Recommendations at discharge:  Follow up with PCP Dr. Lawerance Bach for pneumonia in 1 week Follow up with Cardiology Dr. Allyson Sabal in 2 weeks Dr. Lawerance Bach: If SOB persists despite antibiotics, please update PFTs  He presented with a few weeks of exertional shortness of breath with climbing stairs and eventually developed sharp pleuritic pain.  Has history of systolic heart failure with a EF 40-45%, hypertension, COPD, PPM, DM and OSA.  In the ED CTA ruled out PE.  Did show atelectasis or linear infiltrates at bases.  Chest pain was significant - morphine 2 mg did not help much but the lasix is what helped the chest pain.   Chest pain, suspected pneumonia: Suspected pleuritic chest pain from pneumonia Pleuritic in nature CTA showed linear infiltrates and WBC and procalcitonin were slightly elevated PSI 70-low risk Discharged on oral antibiotics for 5 days-Augmentin  Acute on chronic systolic and diastolic heart failure: Cardiology consulted Symptoms improved with IV Lasix Obtain echo-EF unchanged, no new valvular disease Sent home with Lasix 20 mg daily x 3 days and he will use that intermittently as needed when he becomes short of breath  Shortness of breath and pleuritic chest pain are better.  He has completed the antibiotics.  He is using his maintenance inhaler and albuterol neb treatments as needed  He did have a little bit of shortness of breath since being home from the emergency room and he took lasix 20 mg x 3 days had his symptoms improved he will continue to do this as needed.  He has already followed up with cardiology.  At this point feels back to baseline  Medications and allergies reviewed with patient and updated if appropriate.  Current Outpatient Medications on File Prior to Visit   Medication Sig Dispense Refill   ACCU-CHEK AVIVA PLUS test strip USE AS INSTRUCTED TO TEST BLOOD SUGAR 6 TIMES DAILY. DX CODE: E11.59 500 strip 4   ACCU-CHEK SOFTCLIX LANCETS lancets Use to test blood sugar daily as directed. 100 each 3   albuterol (PROVENTIL) (2.5 MG/3ML) 0.083% nebulizer solution TAKE 3 MLS BY NEBULIZATION EVERY 6 (SIX) HOURS AS NEEDED FOR WHEEZING OR SHORTNESS OF BREATH. 150 mL 5   albuterol (VENTOLIN HFA) 108 (90 Base) MCG/ACT inhaler TAKE 2 PUFFS BY MOUTH EVERY 6 HOURS AS NEEDED FOR WHEEZE OR SHORTNESS OF BREATH 6.7 each 11   amoxicillin-clavulanate (AUGMENTIN) 875-125 MG tablet Take 1 tablet by mouth every 12 (twelve) hours. 9 tablet 0   aspirin EC 81 MG tablet Take 1 tablet (81 mg total) by mouth at bedtime. Swallow whole. 30 tablet 12   Continuous Blood Gluc Receiver (FREESTYLE LIBRE READER) DEVI 1 Device by Does not apply route every 14 (fourteen) days. Freestyle libre 3 reader 1 each 0   Continuous Glucose Sensor (FREESTYLE LIBRE 14 DAY SENSOR) MISC APPLY 1 SENSOR EVERY 14 DAYS 2 each 2   dapagliflozin propanediol (FARXIGA) 10 MG TABS tablet Take 1 tablet (10 mg total) by mouth daily before breakfast. 90 tablet 3   furosemide (LASIX) 20 MG tablet Take 1 tablet (20 mg total) by mouth daily as needed (For swelling or edema). 30 tablet 1   HYDROcodone-acetaminophen (NORCO) 7.5-325 MG tablet Take 1 tablet by mouth every 6 (six) hours as needed  for severe pain (pain score 7-10) (for chronic joint pain, neck pain). 90 tablet 0   LORazepam (ATIVAN) 1 MG tablet TAKE 1/2 TO 1 TABLET BY MOUTH NIGHTLY AS NEEDED. 30 tablet 5   metFORMIN (GLUCOPHAGE) 500 MG tablet TAKE 1 TABLET BY MOUTH TWICE A DAY WITH FOOD 180 tablet 1   montelukast (SINGULAIR) 10 MG tablet TAKE 1 TABLET BY MOUTH AT  BEDTIME 90 tablet 3   nebivolol (BYSTOLIC) 5 MG tablet Take 1 tablet (5 mg total) by mouth daily. 90 tablet 3   nitroGLYCERIN (NITROSTAT) 0.4 MG SL tablet Place 1 tablet (0.4 mg total) under the tongue  every 5 (five) minutes x 3 doses as needed for chest pain. 75 tablet 1   Respiratory Therapy Supplies (FLUTTER) DEVI Twice a day and prn as needed, may increase if feeling worse 1 each 0   rosuvastatin (CRESTOR) 10 MG tablet TAKE 1 TABLET ON MONDAY, WEDNESDAY & FRIDAY. 45 tablet 3   SYMBICORT 160-4.5 MCG/ACT inhaler USE 1 TO 2 INHALATIONS BY  MOUTH EVERY 12 HOURS AS  NEEDED (Patient taking differently: Inhale 2 puffs into the lungs in the morning and at bedtime.) 30.6 g 3   vardenafil (LEVITRA) 20 MG tablet TAKE 1 TABLET BY MOUTH AS DIRECTED. CAN NOT BE TAKEN WITH NITROGLYCERIN. NOT COVERED BY INSURANCE 6 tablet 2   No current facility-administered medications on file prior to visit.     Review of Systems  Constitutional:  Negative for fever.  Respiratory:  Negative for shortness of breath, wheezing and stridor.   Cardiovascular:  Negative for chest pain, palpitations and leg swelling.       Objective:   Vitals:   12/29/23 0934  BP: 114/70  Pulse: 75  Temp: 97.9 F (36.6 C)  SpO2: 95%   BP Readings from Last 3 Encounters:  12/29/23 114/70  12/20/23 108/66  12/05/23 103/62   Wt Readings from Last 3 Encounters:  12/29/23 170 lb (77.1 kg)  12/20/23 170 lb (77.1 kg)  12/04/23 179 lb 14.3 oz (81.6 kg)   Body mass index is 25.1 kg/m.    Physical Exam Constitutional:      General: He is not in acute distress.    Appearance: Normal appearance. He is not ill-appearing.  HENT:     Head: Normocephalic and atraumatic.  Eyes:     Conjunctiva/sclera: Conjunctivae normal.  Cardiovascular:     Rate and Rhythm: Normal rate and regular rhythm.     Heart sounds: Normal heart sounds.  Pulmonary:     Effort: Pulmonary effort is normal. No respiratory distress.     Breath sounds: Normal breath sounds. No wheezing or rales.  Musculoskeletal:     Right lower leg: No edema.     Left lower leg: No edema.  Skin:    General: Skin is warm and dry.     Findings: No rash.  Neurological:      Mental Status: He is alert. Mental status is at baseline.  Psychiatric:        Mood and Affect: Mood normal.        Lab Results  Component Value Date   WBC 12.8 (H) 12/05/2023   HGB 12.3 (L) 12/05/2023   HCT 38.3 (L) 12/05/2023   PLT 236 12/05/2023   GLUCOSE 170 (H) 12/05/2023   CHOL 97 08/23/2023   TRIG 60.0 08/23/2023   HDL 40.60 08/23/2023   LDLCALC 45 08/23/2023   ALT 18 08/23/2023   AST 19 08/23/2023   NA  134 (L) 12/05/2023   K 4.6 12/05/2023   CL 99 12/05/2023   CREATININE 1.16 12/05/2023   BUN 22 12/05/2023   CO2 23 12/05/2023   TSH 1.64 02/16/2021   PSA 0.79 08/31/2012   HGBA1C 6.6 (H) 08/23/2023   MICROALBUR <0.7 12/31/2022     Assessment & Plan:    See Problem List for Assessment and Plan of chronic medical problems.

## 2023-12-29 ENCOUNTER — Ambulatory Visit: Payer: Medicare Other | Admitting: Internal Medicine

## 2023-12-29 VITALS — BP 114/70 | HR 75 | Temp 97.9°F | Ht 69.0 in | Wt 170.0 lb

## 2023-12-29 DIAGNOSIS — I10 Essential (primary) hypertension: Secondary | ICD-10-CM | POA: Diagnosis not present

## 2023-12-29 DIAGNOSIS — J189 Pneumonia, unspecified organism: Secondary | ICD-10-CM

## 2023-12-29 DIAGNOSIS — I5042 Chronic combined systolic (congestive) and diastolic (congestive) heart failure: Secondary | ICD-10-CM

## 2023-12-29 NOTE — Assessment & Plan Note (Addendum)
Acute Hospitalization for probable pneumonia-and shortness of breath and pleuritic chest pain Also there is an element of heart failure CTA ruled out PE and showed atelectasis or linear infiltrates at bases, elevated WBC, PSI Discharged home with Augmentin x 5 days Using inhalers as prescribed

## 2023-12-29 NOTE — Assessment & Plan Note (Signed)
Chronic Blood pressure well controlled Continue Bystolic 5 mg daily

## 2023-12-29 NOTE — Assessment & Plan Note (Addendum)
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

## 2024-01-06 ENCOUNTER — Encounter: Payer: Self-pay | Admitting: Internal Medicine

## 2024-01-06 ENCOUNTER — Ambulatory Visit: Payer: HMO | Attending: Cardiology

## 2024-01-06 DIAGNOSIS — Z950 Presence of cardiac pacemaker: Secondary | ICD-10-CM

## 2024-01-06 LAB — CUP PACEART INCLINIC DEVICE CHECK
Battery Remaining Longevity: 26 mo
Battery Voltage: 2.95 V
Brady Statistic RA Percent Paced: 34 %
Brady Statistic RV Percent Paced: 97 %
Date Time Interrogation Session: 20250124162919
Implantable Lead Connection Status: 753985
Implantable Lead Connection Status: 753985
Implantable Lead Connection Status: 753985
Implantable Lead Implant Date: 20190813
Implantable Lead Implant Date: 20190813
Implantable Lead Implant Date: 20190813
Implantable Lead Location: 753858
Implantable Lead Location: 753859
Implantable Lead Location: 753860
Implantable Pulse Generator Implant Date: 20190813
Lead Channel Impedance Value: 387.5 Ohm
Lead Channel Impedance Value: 425 Ohm
Lead Channel Impedance Value: 675 Ohm
Lead Channel Pacing Threshold Amplitude: 0.625 V
Lead Channel Pacing Threshold Amplitude: 0.875 V
Lead Channel Pacing Threshold Pulse Width: 0.5 ms
Lead Channel Pacing Threshold Pulse Width: 0.5 ms
Lead Channel Sensing Intrinsic Amplitude: 12 mV
Lead Channel Sensing Intrinsic Amplitude: 2.4 mV
Lead Channel Setting Pacing Amplitude: 1.625
Lead Channel Setting Pacing Amplitude: 2 V
Lead Channel Setting Pacing Amplitude: 2.5 V
Lead Channel Setting Pacing Pulse Width: 0.5 ms
Lead Channel Setting Pacing Pulse Width: 0.5 ms
Lead Channel Setting Sensing Sensitivity: 5 mV
Pulse Gen Model: 3562
Pulse Gen Serial Number: 9043864

## 2024-01-06 NOTE — Telephone Encounter (Signed)
LMTCB. My chart message sent.

## 2024-01-06 NOTE — Telephone Encounter (Signed)
Appointment made in DC to adjust for LV stim.

## 2024-01-06 NOTE — Progress Notes (Signed)
Checked in clinic w/ industry present. LV stim present. Vector changed to M3-M2. Stim present in first pole.

## 2024-01-08 ENCOUNTER — Other Ambulatory Visit: Payer: Self-pay | Admitting: Family Medicine

## 2024-01-09 ENCOUNTER — Other Ambulatory Visit: Payer: Self-pay

## 2024-01-09 ENCOUNTER — Other Ambulatory Visit: Payer: Self-pay | Admitting: Internal Medicine

## 2024-01-09 MED ORDER — HYDROCODONE-ACETAMINOPHEN 7.5-325 MG PO TABS
1.0000 | ORAL_TABLET | Freq: Four times a day (QID) | ORAL | 0 refills | Status: DC | PRN
  Filled 2024-01-09: qty 90, 23d supply, fill #0

## 2024-01-13 ENCOUNTER — Other Ambulatory Visit: Payer: Self-pay

## 2024-01-17 ENCOUNTER — Other Ambulatory Visit: Payer: Self-pay

## 2024-01-26 ENCOUNTER — Telehealth: Payer: Self-pay | Admitting: Internal Medicine

## 2024-01-26 NOTE — Telephone Encounter (Signed)
Form completed and faxed back today

## 2024-01-26 NOTE — Telephone Encounter (Signed)
Patient dropped off document Insurance Form, to be filled out by provider. Patient requested to send it back via Fax within 7-days. Document is located in providers tray at front office.Please advise at Mobile [phone] (mobile)

## 2024-01-27 ENCOUNTER — Ambulatory Visit (INDEPENDENT_AMBULATORY_CARE_PROVIDER_SITE_OTHER): Payer: Medicare Other

## 2024-01-27 DIAGNOSIS — I441 Atrioventricular block, second degree: Secondary | ICD-10-CM | POA: Diagnosis not present

## 2024-01-28 LAB — CUP PACEART REMOTE DEVICE CHECK
Battery Remaining Longevity: 25 mo
Battery Remaining Percentage: 30 %
Battery Voltage: 2.93 V
Brady Statistic AP VP Percent: 33 %
Brady Statistic AP VS Percent: 1 %
Brady Statistic AS VP Percent: 64 %
Brady Statistic AS VS Percent: 1.2 %
Brady Statistic RA Percent Paced: 32 %
Date Time Interrogation Session: 20250214020008
Implantable Lead Connection Status: 753985
Implantable Lead Connection Status: 753985
Implantable Lead Connection Status: 753985
Implantable Lead Implant Date: 20190813
Implantable Lead Implant Date: 20190813
Implantable Lead Implant Date: 20190813
Implantable Lead Location: 753858
Implantable Lead Location: 753859
Implantable Lead Location: 753860
Implantable Pulse Generator Implant Date: 20190813
Lead Channel Impedance Value: 380 Ohm
Lead Channel Impedance Value: 430 Ohm
Lead Channel Impedance Value: 540 Ohm
Lead Channel Pacing Threshold Amplitude: 0.5 V
Lead Channel Pacing Threshold Amplitude: 0.75 V
Lead Channel Pacing Threshold Amplitude: 1 V
Lead Channel Pacing Threshold Pulse Width: 0.5 ms
Lead Channel Pacing Threshold Pulse Width: 0.5 ms
Lead Channel Pacing Threshold Pulse Width: 0.5 ms
Lead Channel Sensing Intrinsic Amplitude: 12 mV
Lead Channel Sensing Intrinsic Amplitude: 3.9 mV
Lead Channel Setting Pacing Amplitude: 1.75 V
Lead Channel Setting Pacing Amplitude: 2 V
Lead Channel Setting Pacing Amplitude: 2.5 V
Lead Channel Setting Pacing Pulse Width: 0.5 ms
Lead Channel Setting Pacing Pulse Width: 0.5 ms
Lead Channel Setting Sensing Sensitivity: 5 mV
Pulse Gen Model: 3562
Pulse Gen Serial Number: 9043864

## 2024-01-30 ENCOUNTER — Encounter: Payer: Self-pay | Admitting: Internal Medicine

## 2024-02-03 ENCOUNTER — Encounter: Payer: Self-pay | Admitting: Internal Medicine

## 2024-02-03 ENCOUNTER — Other Ambulatory Visit: Payer: Self-pay

## 2024-02-03 DIAGNOSIS — E1159 Type 2 diabetes mellitus with other circulatory complications: Secondary | ICD-10-CM

## 2024-02-03 MED ORDER — FREESTYLE LIBRE 14 DAY SENSOR MISC: 2 refills | Status: DC

## 2024-02-10 ENCOUNTER — Other Ambulatory Visit: Payer: Self-pay | Admitting: Internal Medicine

## 2024-02-10 ENCOUNTER — Other Ambulatory Visit: Payer: Self-pay

## 2024-02-10 MED ORDER — HYDROCODONE-ACETAMINOPHEN 7.5-325 MG PO TABS
1.0000 | ORAL_TABLET | Freq: Four times a day (QID) | ORAL | 0 refills | Status: DC | PRN
  Filled 2024-02-10: qty 90, 23d supply, fill #0

## 2024-02-19 NOTE — Patient Instructions (Addendum)
      Blood work was ordered.       Medications changes include :   None    A referral was ordered and someone will call you to schedule an appointment.     Return in about 6 months (around 08/22/2024) for Physical Exam.

## 2024-02-19 NOTE — Progress Notes (Unsigned)
 Subjective:    Patient ID: Jeffery [name], male    DOB: 04/27/1941, 83 y.o.   MRN: [id number]     HPI Attikus is here for follow up of his chronic medical problems.  Getting over a cold-has lots of drainage and some associated cough, but does not feel like his asthma has flared.  He is doing the inhalers regularly.  Medications and allergies reviewed with patient and updated if appropriate.  Current Outpatient Medications on File Prior to Visit  Medication Sig Dispense Refill   ACCU-CHEK AVIVA PLUS test strip USE AS INSTRUCTED TO TEST BLOOD SUGAR 6 TIMES DAILY. DX CODE: E11.59 500 strip 4   ACCU-CHEK SOFTCLIX LANCETS lancets Use to test blood sugar daily as directed. 100 each 3   albuterol (PROVENTIL) (2.5 MG/3ML) 0.083% nebulizer solution TAKE 3 MLS BY NEBULIZATION EVERY 6 (SIX) HOURS AS NEEDED FOR WHEEZING OR SHORTNESS OF BREATH. 150 mL 5   albuterol (VENTOLIN HFA) 108 (90 Base) MCG/ACT inhaler TAKE 2 PUFFS BY MOUTH EVERY 6 HOURS AS NEEDED FOR WHEEZE OR SHORTNESS OF BREATH 6.7 each 11   aspirin EC 81 MG tablet Take 1 tablet (81 mg total) by mouth at bedtime. Swallow whole. 30 tablet 12   celecoxib (CELEBREX) 100 MG capsule TAKE 1 CAPSULE BY MOUTH TWICE A DAY 180 capsule 3   Continuous Blood Gluc Receiver (FREESTYLE LIBRE READER) DEVI 1 Device by Does not apply route every 14 (fourteen) days. Freestyle libre 3 reader 1 each 0   Continuous Glucose Sensor (FREESTYLE LIBRE 14 DAY SENSOR) MISC APPLY 1 SENSOR EVERY 14 DAYS 2 each 2   dapagliflozin propanediol (FARXIGA) 10 MG TABS tablet Take 1 tablet (10 mg total) by mouth daily before breakfast. 90 tablet 3   furosemide (LASIX) 20 MG tablet Take 1 tablet (20 mg total) by mouth daily as needed (For swelling or edema). 30 tablet 1   HYDROcodone-acetaminophen (NORCO) 7.5-325 MG tablet Take 1 tablet by mouth every 6 (six) hours as needed for severe pain (pain score 7-10) (for chronic joint pain, neck pain). 90 tablet 0   LORazepam  (ATIVAN) 1 MG tablet TAKE 1/2 TO 1 TABLET BY MOUTH NIGHTLY AS NEEDED. 30 tablet 5   metFORMIN (GLUCOPHAGE) 500 MG tablet TAKE 1 TABLET BY MOUTH TWICE A DAY WITH FOOD 180 tablet 1   montelukast (SINGULAIR) 10 MG tablet TAKE 1 TABLET BY MOUTH AT  BEDTIME 90 tablet 3   nebivolol (BYSTOLIC) 5 MG tablet Take 1 tablet (5 mg total) by mouth daily. 90 tablet 3   nitroGLYCERIN (NITROSTAT) 0.4 MG SL tablet Place 1 tablet (0.4 mg total) under the tongue every 5 (five) minutes x 3 doses as needed for chest pain. 75 tablet 1   Respiratory Therapy Supplies (FLUTTER) DEVI Twice a day and prn as needed, may increase if feeling worse 1 each 0   rosuvastatin (CRESTOR) 10 MG tablet TAKE 1 TABLET ON MONDAY, WEDNESDAY & FRIDAY. 45 tablet 3   SYMBICORT 160-4.5 MCG/ACT inhaler USE 1 TO 2 INHALATIONS BY  MOUTH EVERY 12 HOURS AS  NEEDED (Patient taking differently: Inhale 2 puffs into the lungs in the morning and at bedtime.) 30.6 g 3   vardenafil (LEVITRA) 20 MG tablet TAKE 1 TABLET BY MOUTH AS DIRECTED. CAN NOT BE TAKEN WITH NITROGLYCERIN. NOT COVERED BY INSURANCE 6 tablet 2   No current facility-administered medications on file prior to visit.     Review of Systems  Constitutional:  Negative for fever.  HENT:  Positive for congestion and postnasal drip. Negative for sinus pain.        Clogged ears intermittently  Respiratory:  Positive for cough and chest tightness (occ). Negative for shortness of breath and wheezing.   Cardiovascular:  Negative for chest pain, palpitations and leg swelling.  Neurological:  Negative for light-headedness and headaches.       Objective:   Vitals:   02/20/24 0925  BP: 130/82  Pulse: 76  Temp: 97.6 F (36.4 C)  SpO2: 99%   BP Readings from Last 3 Encounters:  02/20/24 130/82  12/29/23 114/70  12/20/23 108/66   Wt Readings from Last 3 Encounters:  02/20/24 168 lb (76.2 kg)  12/29/23 170 lb (77.1 kg)  12/20/23 170 lb (77.1 kg)   Body mass index is 24.81 kg/m.     Physical Exam Constitutional:      General: He is not in acute distress.    Appearance: Normal appearance. He is not ill-appearing.  HENT:     Head: Normocephalic and atraumatic.  Eyes:     Conjunctiva/sclera: Conjunctivae normal.  Cardiovascular:     Rate and Rhythm: Normal rate and regular rhythm.     Heart sounds: Normal heart sounds.  Pulmonary:     Effort: Pulmonary effort is normal. No respiratory distress.     Breath sounds: Normal breath sounds. No wheezing or rales.  Musculoskeletal:     Right lower leg: No edema.     Left lower leg: No edema.  Skin:    General: Skin is warm and dry.     Findings: No rash.  Neurological:     Mental Status: He is alert. Mental status is at baseline.  Psychiatric:        Mood and Affect: Mood normal.        Lab Results  Component Value Date   WBC 12.8 (H) 12/05/2023   HGB 12.3 (L) 12/05/2023   HCT 38.3 (L) 12/05/2023   PLT 236 12/05/2023   GLUCOSE 170 (H) 12/05/2023   CHOL 97 08/23/2023   TRIG 60.0 08/23/2023   HDL 40.60 08/23/2023   LDLCALC 45 08/23/2023   ALT 18 08/23/2023   AST 19 08/23/2023   NA 134 (L) 12/05/2023   K 4.6 12/05/2023   CL 99 12/05/2023   CREATININE 1.16 12/05/2023   BUN 22 12/05/2023   CO2 23 12/05/2023   TSH 1.64 02/16/2021   PSA 0.79 08/31/2012   HGBA1C 6.6 (H) 08/23/2023   MICROALBUR <0.7 12/31/2022     Assessment & Plan:    See Problem List for Assessment and Plan of chronic medical problems.

## 2024-02-20 ENCOUNTER — Ambulatory Visit (INDEPENDENT_AMBULATORY_CARE_PROVIDER_SITE_OTHER): Payer: Medicare Other | Admitting: Internal Medicine

## 2024-02-20 ENCOUNTER — Encounter: Payer: Self-pay | Admitting: Internal Medicine

## 2024-02-20 ENCOUNTER — Other Ambulatory Visit: Payer: Self-pay

## 2024-02-20 VITALS — BP 130/82 | HR 76 | Temp 97.6°F | Ht 69.0 in | Wt 168.0 lb

## 2024-02-20 DIAGNOSIS — I10 Essential (primary) hypertension: Secondary | ICD-10-CM | POA: Diagnosis not present

## 2024-02-20 DIAGNOSIS — E1159 Type 2 diabetes mellitus with other circulatory complications: Secondary | ICD-10-CM | POA: Diagnosis not present

## 2024-02-20 DIAGNOSIS — E782 Mixed hyperlipidemia: Secondary | ICD-10-CM

## 2024-02-20 DIAGNOSIS — M150 Primary generalized (osteo)arthritis: Secondary | ICD-10-CM | POA: Diagnosis not present

## 2024-02-20 DIAGNOSIS — G8929 Other chronic pain: Secondary | ICD-10-CM | POA: Diagnosis not present

## 2024-02-20 DIAGNOSIS — J45909 Unspecified asthma, uncomplicated: Secondary | ICD-10-CM

## 2024-02-20 DIAGNOSIS — E1142 Type 2 diabetes mellitus with diabetic polyneuropathy: Secondary | ICD-10-CM

## 2024-02-20 LAB — COMPREHENSIVE METABOLIC PANEL WITH GFR
ALT: 19 U/L (ref 0–53)
AST: 19 U/L (ref 0–37)
Albumin: 4.3 g/dL (ref 3.5–5.2)
Alkaline Phosphatase: 59 U/L (ref 39–117)
BUN: 28 mg/dL — ABNORMAL HIGH (ref 6–23)
CO2: 30 meq/L (ref 19–32)
Calcium: 10 mg/dL (ref 8.4–10.5)
Chloride: 103 meq/L (ref 96–112)
Creatinine, Ser: 1.06 mg/dL (ref 0.40–1.50)
GFR: 66.28 mL/min
Glucose, Bld: 87 mg/dL (ref 70–99)
Potassium: 5.3 meq/L — ABNORMAL HIGH (ref 3.5–5.1)
Sodium: 141 meq/L (ref 135–145)
Total Bilirubin: 0.4 mg/dL (ref 0.2–1.2)
Total Protein: 6.9 g/dL (ref 6.0–8.3)

## 2024-02-20 LAB — CBC WITH DIFFERENTIAL/PLATELET
Basophils Absolute: 0.1 10*3/uL (ref 0.0–0.1)
Basophils Relative: 0.6 % (ref 0.0–3.0)
Eosinophils Absolute: 0.2 10*3/uL (ref 0.0–0.7)
Eosinophils Relative: 2 % (ref 0.0–5.0)
HCT: 43.9 % (ref 39.0–52.0)
Hemoglobin: 14.2 g/dL (ref 13.0–17.0)
Lymphocytes Relative: 8.1 % — ABNORMAL LOW (ref 12.0–46.0)
Lymphs Abs: 0.8 10*3/uL (ref 0.7–4.0)
MCHC: 32.4 g/dL (ref 30.0–36.0)
MCV: 86.8 fl (ref 78.0–100.0)
Monocytes Absolute: 1 10*3/uL (ref 0.1–1.0)
Monocytes Relative: 9.8 % (ref 3.0–12.0)
Neutro Abs: 8 10*3/uL — ABNORMAL HIGH (ref 1.4–7.7)
Neutrophils Relative %: 79.5 % — ABNORMAL HIGH (ref 43.0–77.0)
Platelets: 296 10*3/uL (ref 150.0–400.0)
RBC: 5.05 Mil/uL (ref 4.22–5.81)
RDW: 15.7 % — ABNORMAL HIGH (ref 11.5–15.5)
WBC: 10.1 10*3/uL (ref 4.0–10.5)

## 2024-02-20 LAB — LIPID PANEL
Cholesterol: 116 mg/dL (ref 0–200)
HDL: 39.7 mg/dL
LDL Cholesterol: 57 mg/dL (ref 0–99)
NonHDL: 76.05
Total CHOL/HDL Ratio: 3
Triglycerides: 97 mg/dL (ref 0.0–149.0)
VLDL: 19.4 mg/dL (ref 0.0–40.0)

## 2024-02-20 LAB — MICROALBUMIN / CREATININE URINE RATIO
Creatinine,U: 54.9 mg/dL
Microalb Creat Ratio: 13.8 mg/g (ref 0.0–30.0)
Microalb, Ur: 0.8 mg/dL (ref 0.0–1.9)

## 2024-02-20 LAB — HEMOGLOBIN A1C: Hgb A1c MFr Bld: 6.7 % — ABNORMAL HIGH (ref 4.6–6.5)

## 2024-02-20 MED ORDER — FLUTICASONE-SALMETEROL 230-21 MCG/ACT IN AERO: 2.0000 | INHALATION_SPRAY | Freq: Two times a day (BID) | RESPIRATORY_TRACT | 12 refills | Status: DC

## 2024-02-20 NOTE — Assessment & Plan Note (Addendum)
 Chronic generalized arthritis Pain controlled with Norco 7.5 mg - 325 mg-1 pill every 6 hours as needed-taking less often Check urine tox [place] controlled substance database checked-taking medication appropriately

## 2024-02-20 NOTE — Assessment & Plan Note (Addendum)
 Chronic controlled Continue singular 10 mg nightly, Allegra as needed Symbicort no longer covered-switch to Advair HFA 230-21 mcg per ACT twice daily, albuterol as needed

## 2024-02-20 NOTE — Assessment & Plan Note (Addendum)
 Chronic Generalized Stable  Pain management with Norco as needed Pain controlled.  Taking medication appropriately. Continue Norco 7.5-325 mg 1 tab every 6 hours as needed [place] controlled substance database checked.  Utox today

## 2024-02-20 NOTE — Assessment & Plan Note (Signed)
Chronic Blood pressure well controlled Continue Bystolic 5 mg daily

## 2024-02-20 NOTE — Assessment & Plan Note (Signed)
 Chronic Controlled No longer taking gabapentin

## 2024-02-20 NOTE — Assessment & Plan Note (Addendum)
 Chronic  Lab Results  Component Value Date   HGBA1C 6.6 (H) 08/23/2023   Sugars controlled Has not check sugars recently Check A1c, urine albumin/cr ratio Continue Farxiga 10 mg daily, metformin 500 mg twice daily Stressed regular exercise, diabetic diet

## 2024-02-20 NOTE — Assessment & Plan Note (Signed)
Chronic Regular exercise and healthy diet encouraged Check lipid panel  Continue Crestor 10 mg 3 times weekly 

## 2024-02-21 ENCOUNTER — Encounter: Payer: Self-pay | Admitting: Internal Medicine

## 2024-02-23 LAB — DRUG MONITOR, BENZO,W/CONF, URINE
Alphahydroxyalprazolam: NEGATIVE ng/mL (ref <25)
Alphahydroxymidazolam: NEGATIVE ng/mL (ref <50)
Alphahydroxytriazolam: NEGATIVE ng/mL (ref <50)
Aminoclonazepam: NEGATIVE ng/mL (ref <25)
Benzodiazepines: POSITIVE ng/mL — AB (ref <100)
Hydroxyethylflurazepam: NEGATIVE ng/mL (ref <50)
Lorazepam: 466 ng/mL — ABNORMAL HIGH (ref <50)
Nordiazepam: NEGATIVE ng/mL (ref <50)
Oxazepam: NEGATIVE ng/mL (ref <50)
Temazepam: NEGATIVE ng/mL (ref <50)

## 2024-02-23 LAB — DRUG MONITOR, OPIATES,W/CONF, URINE
Codeine: NEGATIVE ng/mL (ref <50)
Hydrocodone: 314 ng/mL — ABNORMAL HIGH (ref <50)
Hydromorphone: 405 ng/mL — ABNORMAL HIGH (ref <50)
Morphine: NEGATIVE ng/mL (ref <50)
Norhydrocodone: 244 ng/mL — ABNORMAL HIGH (ref <50)
Opiates: POSITIVE ng/mL — AB (ref <100)

## 2024-02-23 LAB — DRUG MONITOR,AMPHETAMINE,W/CONF, URINE: Amphetamines: NEGATIVE ng/mL (ref <500)

## 2024-02-23 LAB — DRUG MONITOR, TRAMADOL,QN, URINE
Desmethyltramadol: NEGATIVE ng/mL (ref <100)
Tramadol: NEGATIVE ng/mL (ref <100)

## 2024-02-23 LAB — PRESCRIBED DRUGS,MEDMATCH(R)

## 2024-02-23 LAB — DRUG MONITOR, OXYCODONE,W/CONF, URINE: Oxycodone: NEGATIVE ng/mL (ref <100)

## 2024-02-23 LAB — DM TEMPLATE

## 2024-02-23 LAB — DRUG MONITOR, COCAINEMETAB, W/CONF, URINE: Cocaine Metabolite: NEGATIVE ng/mL (ref <150)

## 2024-02-23 LAB — DRUG MONITOR,BARBITURATE,W/CONF, URINE: Barbiturates: NEGATIVE ng/mL (ref <300)

## 2024-02-27 ENCOUNTER — Telehealth: Payer: Self-pay | Admitting: Internal Medicine

## 2024-02-27 ENCOUNTER — Other Ambulatory Visit (HOSPITAL_BASED_OUTPATIENT_CLINIC_OR_DEPARTMENT_OTHER): Payer: Self-pay

## 2024-02-27 NOTE — Telephone Encounter (Unsigned)
 Copied from CRM [id number]. Topic: Clinical - Prescription Issue >> Feb 27, 2024 11:55 AM Drema Balzarine wrote: Reason for CRM: Health Teams request a call back for clinical information regarding the fluticasone-salmeterol (ADVAIR HFA) 230-21 MCG/ACT inhaler  Reference number: [id number]

## 2024-02-28 ENCOUNTER — Encounter: Payer: Self-pay | Admitting: Internal Medicine

## 2024-02-28 NOTE — Addendum Note (Signed)
 Addended by: Elease Etienne A on: 02/28/2024 09:59 AM   Modules accepted: Orders

## 2024-02-28 NOTE — Progress Notes (Signed)
 Remote pacemaker transmission.

## 2024-02-29 MED ORDER — MONTELUKAST SODIUM 10 MG PO TABS: 10.0000 mg | ORAL_TABLET | Freq: Every day | ORAL | 3 refills | Status: AC

## 2024-03-01 ENCOUNTER — Ambulatory Visit: Admitting: Podiatry

## 2024-03-01 NOTE — Telephone Encounter (Signed)
 Form and clinicals faxed back to them today at [phone]. #12 pages confirmed

## 2024-03-08 ENCOUNTER — Encounter: Payer: Self-pay | Admitting: Podiatry

## 2024-03-08 ENCOUNTER — Ambulatory Visit: Admitting: Podiatry

## 2024-03-08 DIAGNOSIS — M7752 Other enthesopathy of left foot: Secondary | ICD-10-CM | POA: Diagnosis not present

## 2024-03-08 DIAGNOSIS — M7751 Other enthesopathy of right foot: Secondary | ICD-10-CM

## 2024-03-08 MED ORDER — TRIAMCINOLONE ACETONIDE 10 MG/ML IJ SUSP
10.0000 mg | Freq: Once | INTRAMUSCULAR | Status: AC
  Administered 2024-03-08: 10 mg via INTRA_ARTICULAR

## 2024-03-08 NOTE — Progress Notes (Signed)
 Subjective:   Patient ID: Jeffery [name], male   DOB: 83 y.o.   MRN: [id number]   HPI Patient presents stating that he has a lot of pain on the outsides of both feet and he does get lesions which become irritated   ROS      Objective:  Physical Exam  Neurovascular status intact inflammation around the base of the fifth metatarsal both feet with fluid buildup around the area     Assessment:  Inflammatory capsulitis at the base of the fifth metatarsal bilateral painful with lesions bilateral     Plan:  Sterile prep injected the base of the fifth MPJ bilateral into the plantar capsule 3 mg Dexasone Kenalog 5 mg Xylocaine and debrided lesions bilateral no iatrogenic bleeding

## 2024-03-10 ENCOUNTER — Other Ambulatory Visit: Payer: Self-pay | Admitting: Internal Medicine

## 2024-03-11 MED ORDER — HYDROCODONE-ACETAMINOPHEN 7.5-325 MG PO TABS
1.0000 | ORAL_TABLET | Freq: Four times a day (QID) | ORAL | 0 refills | Status: DC | PRN
  Filled 2024-03-11: qty 90, 23d supply, fill #0

## 2024-03-12 ENCOUNTER — Other Ambulatory Visit: Payer: Self-pay

## 2024-03-15 ENCOUNTER — Other Ambulatory Visit: Payer: Self-pay | Admitting: Internal Medicine

## 2024-03-16 ENCOUNTER — Other Ambulatory Visit: Payer: Self-pay

## 2024-04-03 ENCOUNTER — Encounter

## 2024-04-03 NOTE — Progress Notes (Signed)
 This encounter was created in error - please disregard.  Sent link to pt/he never got on.  Called pt and he stated that he forgot about the visit today.  He will call office to reschedule.

## 2024-04-10 ENCOUNTER — Telehealth: Payer: Self-pay | Admitting: Internal Medicine

## 2024-04-10 NOTE — Telephone Encounter (Signed)
 3-5 days depending on how bad his symptoms are  --- -if not better in 5 may need 7

## 2024-04-10 NOTE — Telephone Encounter (Signed)
 Copied from CRM [id number]. Topic: Clinical - Medical Advice >> Apr 10, 2024  9:30 AM Juleen Oakland F wrote: Reason for CRM: patient asthma/allergies flaring up and wants to know if Dr. Donnette Gal suggest he takes the steroids for 3 days more or less? Patient request a message back via MyChart

## 2024-04-11 ENCOUNTER — Other Ambulatory Visit: Payer: Self-pay | Admitting: Internal Medicine

## 2024-04-11 NOTE — Telephone Encounter (Signed)
 Called and left message.  Also sent my-chart message with recommendations.

## 2024-04-12 ENCOUNTER — Other Ambulatory Visit: Payer: Self-pay

## 2024-04-12 MED ORDER — HYDROCODONE-ACETAMINOPHEN 7.5-325 MG PO TABS
1.0000 | ORAL_TABLET | Freq: Four times a day (QID) | ORAL | 0 refills | Status: DC | PRN
  Filled 2024-04-12: qty 90, 23d supply, fill #0

## 2024-04-12 NOTE — Progress Notes (Signed)
 Subjective:    Patient ID: Jeffery [name], male    DOB: 24-Aug-1941, 83 y.o.   MRN: [id number]      HPI Jeffery [name] is here for  Chief Complaint  Patient presents with   Wheezing    Wants you to listen to his lungs and maybe order chest xray to rule out pneumonia. He hasn't taking steroids yet so wanted to check with you first    Allergies/asthma -has been dealing with an asthma exacerbation for the last 10 days.  He is pretty sure allergies because of what supported him.  He has not seen much improvement in the last 10 days.  He has been doing his inhalers/nebulizer treatments consistently and they do help, but still having significant coughing that is productive, wheezing and some shortness of breath.  He does get dyspneic by the end of speaking in full sentence.  He does have steroids at home, but was not sure that he should consider taking them.   Handicap parking -he needs a handicap form     Medications and allergies reviewed with patient and updated if appropriate.  Current Outpatient Medications on File Prior to Visit  Medication Sig Dispense Refill   ACCU-CHEK AVIVA PLUS test strip USE AS INSTRUCTED TO TEST BLOOD SUGAR 6 TIMES DAILY. DX CODE: E11.59 500 strip 4   ACCU-CHEK SOFTCLIX LANCETS lancets Use to test blood sugar daily as directed. 100 each 3   albuterol  (PROVENTIL ) (2.5 MG/3ML) 0.083% nebulizer solution TAKE 3 MLS BY NEBULIZATION EVERY 6 (SIX) HOURS AS NEEDED FOR WHEEZING OR SHORTNESS OF BREATH. 150 mL 5   albuterol  (VENTOLIN  HFA) 108 (90 Base) MCG/ACT inhaler TAKE 2 PUFFS BY MOUTH EVERY 6 HOURS AS NEEDED FOR WHEEZE OR SHORTNESS OF BREATH 6.7 each 11   aspirin  EC 81 MG tablet Take 1 tablet (81 mg total) by mouth at bedtime. Swallow whole. 30 tablet 12   celecoxib  (CELEBREX ) 100 MG capsule TAKE 1 CAPSULE BY MOUTH TWICE A DAY 180 capsule 3   Continuous Blood Gluc Receiver (FREESTYLE LIBRE READER) DEVI 1 Device by Does not apply route every 14 (fourteen) days.  Freestyle libre 3 reader 1 each 0   Continuous Glucose Sensor (FREESTYLE LIBRE 14 DAY SENSOR) MISC APPLY 1 SENSOR EVERY 14 DAYS 2 each 2   dapagliflozin  propanediol (FARXIGA ) 10 MG TABS tablet Take 1 tablet (10 mg total) by mouth daily before breakfast. 90 tablet 3   fluticasone -salmeterol (ADVAIR HFA) 230-21 MCG/ACT inhaler Inhale 2 puffs into the lungs 2 (two) times daily. 1 each 12   furosemide  (LASIX ) 20 MG tablet Take 1 tablet (20 mg total) by mouth daily as needed (For swelling or edema). 30 tablet 1   HYDROcodone -acetaminophen  (NORCO) 7.5-325 MG tablet Take 1 tablet by mouth every 6 (six) hours as needed for severe pain (pain score 7-10) (for chronic joint pain, neck pain). 90 tablet 0   LORazepam  (ATIVAN ) 1 MG tablet TAKE 1/2 TO 1 TABLET BY MOUTH NIGHTLY AS NEEDED. 30 tablet 0   metFORMIN  (GLUCOPHAGE ) 500 MG tablet TAKE 1 TABLET BY MOUTH TWICE A DAY WITH FOOD 180 tablet 1   montelukast  (SINGULAIR ) 10 MG tablet Take 1 tablet (10 mg total) by mouth at bedtime. 100 tablet 3   nebivolol  (BYSTOLIC ) 5 MG tablet Take 1 tablet (5 mg total) by mouth daily. 90 tablet 3   nitroGLYCERIN  (NITROSTAT ) 0.4 MG SL tablet Place 1 tablet (0.4 mg total) under the tongue every 5 (five) minutes x 3 doses as  needed for chest pain. 75 tablet 1   Respiratory Therapy Supplies (FLUTTER) DEVI Twice a day and prn as needed, may increase if feeling worse 1 each 0   rosuvastatin  (CRESTOR ) 10 MG tablet TAKE 1 TABLET ON MONDAY, WEDNESDAY & FRIDAY. 45 tablet 3   vardenafil  (LEVITRA ) 20 MG tablet TAKE 1 TABLET BY MOUTH AS DIRECTED. CAN NOT BE TAKEN WITH NITROGLYCERIN . NOT COVERED BY INSURANCE 6 tablet 2   No current facility-administered medications on file prior to visit.    Review of Systems  Constitutional:  Negative for fever.  HENT:  Positive for congestion. Negative for sore throat.   Respiratory:  Positive for cough, shortness of breath and wheezing.        Objective:   Vitals:   04/13/24 1042  BP: 116/68   Pulse: 72  Temp: 98 F (36.7 C)  SpO2: 99%   BP Readings from Last 3 Encounters:  04/13/24 116/68  02/20/24 130/82  12/29/23 114/70   Wt Readings from Last 3 Encounters:  04/13/24 169 lb (76.7 kg)  02/20/24 168 lb (76.2 kg)  12/29/23 170 lb (77.1 kg)   Body mass index is 24.96 kg/m.    Physical Exam Constitutional:      General: He is not in acute distress.    Appearance: Normal appearance. He is not ill-appearing.  HENT:     Head: Normocephalic and atraumatic.  Cardiovascular:     Rate and Rhythm: Normal rate and regular rhythm.  Pulmonary:     Effort: Pulmonary effort is normal. No respiratory distress.     Breath sounds: Wheezing (Diffuse expiratory wheezing and coarse breath sounds) present.     Comments: Good air entry throughout lung fields Skin:    General: Skin is warm and dry.  Neurological:     Mental Status: He is alert.            Assessment & Plan:    See Problem List for Assessment and Plan of chronic medical problems.

## 2024-04-13 ENCOUNTER — Ambulatory Visit (INDEPENDENT_AMBULATORY_CARE_PROVIDER_SITE_OTHER): Admitting: Internal Medicine

## 2024-04-13 ENCOUNTER — Ambulatory Visit: Admitting: Internal Medicine

## 2024-04-13 ENCOUNTER — Encounter: Payer: Self-pay | Admitting: Internal Medicine

## 2024-04-13 VITALS — BP 116/68 | HR 72 | Temp 98.0°F | Ht 69.0 in | Wt 169.0 lb

## 2024-04-13 DIAGNOSIS — Z7984 Long term (current) use of oral hypoglycemic drugs: Secondary | ICD-10-CM

## 2024-04-13 DIAGNOSIS — I10 Essential (primary) hypertension: Secondary | ICD-10-CM | POA: Diagnosis not present

## 2024-04-13 DIAGNOSIS — E1159 Type 2 diabetes mellitus with other circulatory complications: Secondary | ICD-10-CM

## 2024-04-13 DIAGNOSIS — J4541 Moderate persistent asthma with (acute) exacerbation: Secondary | ICD-10-CM | POA: Diagnosis not present

## 2024-04-13 MED ORDER — AMOXICILLIN-POT CLAVULANATE 875-125 MG PO TABS: 1.0000 | ORAL_TABLET | Freq: Two times a day (BID) | ORAL | 0 refills | Status: AC

## 2024-04-13 NOTE — Assessment & Plan Note (Signed)
 Chronic  Lab Results  Component Value Date   HGBA1C 6.7 (H) 02/20/2024   Sugars controlled and have been controlled at home He will be starting prednisone  and this will cause his sugars to elevate-he will monitor them closely Continue Farxiga  10 mg daily, metformin  500 mg twice daily If your sugars go too high can consider starting glyburide or insulin  for short period of time

## 2024-04-13 NOTE — Patient Instructions (Addendum)
       Medications changes include :   Start steroids.   Augmentin  bid x 10 days

## 2024-04-13 NOTE — Assessment & Plan Note (Signed)
 Acute Started about 10 days ago Has been doing his inhalers and nebulizer treatments consistently without improvement Needs to start prednisone  which she has at home-will taper as prescribed in the past If no improvement in the next 48 hours-few days will start Augmentin  875-125 mg twice daily x 10 days Will hold off on an x-ray for right now, but if there is no improvement we will need to get an x-ray

## 2024-04-13 NOTE — Assessment & Plan Note (Signed)
Chronic Blood pressure well controlled Continue Bystolic 5 mg daily

## 2024-04-17 ENCOUNTER — Other Ambulatory Visit: Payer: Self-pay

## 2024-04-17 ENCOUNTER — Other Ambulatory Visit (HOSPITAL_COMMUNITY): Payer: Self-pay

## 2024-04-18 ENCOUNTER — Other Ambulatory Visit: Payer: Self-pay | Admitting: Internal Medicine

## 2024-04-20 ENCOUNTER — Encounter: Payer: Self-pay | Admitting: Internal Medicine

## 2024-04-20 MED ORDER — PREDNISONE 20 MG PO TABS
40.0000 mg | ORAL_TABLET | Freq: Every day | ORAL | 0 refills | Status: DC
Start: 2024-04-20 — End: 2024-05-08

## 2024-04-24 ENCOUNTER — Encounter: Payer: Self-pay | Admitting: Internal Medicine

## 2024-04-24 ENCOUNTER — Other Ambulatory Visit: Payer: Self-pay

## 2024-04-24 MED ORDER — ALBUTEROL SULFATE (2.5 MG/3ML) 0.083% IN NEBU: 2.5000 mg | INHALATION_SOLUTION | Freq: Four times a day (QID) | RESPIRATORY_TRACT | 5 refills | Status: AC | PRN

## 2024-04-27 ENCOUNTER — Ambulatory Visit (INDEPENDENT_AMBULATORY_CARE_PROVIDER_SITE_OTHER): Payer: Medicare Other

## 2024-04-27 ENCOUNTER — Telehealth: Payer: Self-pay | Admitting: *Deleted

## 2024-04-27 DIAGNOSIS — I441 Atrioventricular block, second degree: Secondary | ICD-10-CM

## 2024-04-27 LAB — CUP PACEART REMOTE DEVICE CHECK
Battery Remaining Longevity: 24 mo
Battery Remaining Percentage: 27 %
Battery Voltage: 2.93 V
Brady Statistic AP VP Percent: 31 %
Brady Statistic AP VS Percent: 1 %
Brady Statistic AS VP Percent: 65 %
Brady Statistic AS VS Percent: 2.2 %
Brady Statistic RA Percent Paced: 30 %
Date Time Interrogation Session: 20250516020012
Implantable Lead Connection Status: 753985
Implantable Lead Connection Status: 753985
Implantable Lead Connection Status: 753985
Implantable Lead Implant Date: 20190813
Implantable Lead Implant Date: 20190813
Implantable Lead Implant Date: 20190813
Implantable Lead Location: 753858
Implantable Lead Location: 753859
Implantable Lead Location: 753860
Implantable Pulse Generator Implant Date: 20190813
Lead Channel Impedance Value: 430 Ohm
Lead Channel Impedance Value: 450 Ohm
Lead Channel Impedance Value: 580 Ohm
Lead Channel Pacing Threshold Amplitude: 0.5 V
Lead Channel Pacing Threshold Amplitude: 0.625 V
Lead Channel Pacing Threshold Amplitude: 0.875 V
Lead Channel Pacing Threshold Pulse Width: 0.5 ms
Lead Channel Pacing Threshold Pulse Width: 0.5 ms
Lead Channel Pacing Threshold Pulse Width: 0.5 ms
Lead Channel Sensing Intrinsic Amplitude: 1.7 mV
Lead Channel Sensing Intrinsic Amplitude: 11.4 mV
Lead Channel Setting Pacing Amplitude: 1.625
Lead Channel Setting Pacing Amplitude: 2 V
Lead Channel Setting Pacing Amplitude: 2.5 V
Lead Channel Setting Pacing Pulse Width: 0.5 ms
Lead Channel Setting Pacing Pulse Width: 0.5 ms
Lead Channel Setting Sensing Sensitivity: 5 mV
Pulse Gen Model: 3562
Pulse Gen Serial Number: 9043864

## 2024-04-27 NOTE — Telephone Encounter (Signed)
 Note made in error

## 2024-04-30 ENCOUNTER — Ambulatory Visit: Payer: Self-pay | Admitting: Internal Medicine

## 2024-05-02 ENCOUNTER — Encounter: Payer: Self-pay | Admitting: Internal Medicine

## 2024-05-03 ENCOUNTER — Encounter: Payer: Self-pay | Admitting: Internal Medicine

## 2024-05-03 NOTE — Progress Notes (Signed)
 Subjective:    Patient ID: Jeffery [name], male    DOB: 01-03-1941, 83 y.o.   MRN: [id number]      HPI [name] is here for  Chief Complaint  Patient presents with   Follow-up    F/u from respiratory issues. Patient states he feels better. Blood sugars are a little high due to the steroids.  Patient concerned about weight loss     Was here 5/2 for asthma exacerbation that was not improving.  He took steroids for several days and Augmentin .  He has been using his inhalers routinely as prescribed.  He is still experiencing coughing, wheezing and tightness in his chest.  At night coughing - last 4-5 days getting more sleep.  Using nebulizer in middle of the night.  He feels tired and weak.   ? Little bit of heart failure -- has had a little edema - has taken lasix  20 mg   Sugar today 144, fasting this am 90 - he has increased his metformin .  Weight loss -- over the past 3 months has lost weight.  His appetite has decreased.  All the men in his family has lost weight at this time in her life.  He eats a good amount of protein.  He is not sure if his weight is starting to level off or not.   Medications and allergies reviewed with patient and updated if appropriate.  Current Outpatient Medications on File Prior to Visit  Medication Sig Dispense Refill   ACCU-CHEK AVIVA PLUS test strip USE AS INSTRUCTED TO TEST BLOOD SUGAR 6 TIMES DAILY. DX CODE: E11.59 500 strip 4   ACCU-CHEK SOFTCLIX LANCETS lancets Use to test blood sugar daily as directed. 100 each 3   albuterol  (PROVENTIL ) (2.5 MG/3ML) 0.083% nebulizer solution Take 3 mLs (2.5 mg total) by nebulization every 6 (six) hours as needed for wheezing or shortness of breath. 150 mL 5   albuterol  (VENTOLIN  HFA) 108 (90 Base) MCG/ACT inhaler TAKE 2 PUFFS BY MOUTH EVERY 6 HOURS AS NEEDED FOR WHEEZE OR SHORTNESS OF BREATH 6.7 each 11   aspirin  EC 81 MG tablet Take 1 tablet (81 mg total) by mouth at bedtime. Swallow whole. 30 tablet 12    celecoxib  (CELEBREX ) 100 MG capsule TAKE 1 CAPSULE BY MOUTH TWICE A DAY 180 capsule 3   Continuous Blood Gluc Receiver (FREESTYLE LIBRE READER) DEVI 1 Device by Does not apply route every 14 (fourteen) days. Freestyle libre 3 reader 1 each 0   Continuous Glucose Sensor (FREESTYLE LIBRE 14 DAY SENSOR) MISC APPLY 1 SENSOR EVERY 14 DAYS 2 each 2   dapagliflozin  propanediol (FARXIGA ) 10 MG TABS tablet Take 1 tablet (10 mg total) by mouth daily before breakfast. 90 tablet 3   fluticasone -salmeterol (ADVAIR HFA) 230-21 MCG/ACT inhaler Inhale 2 puffs into the lungs 2 (two) times daily. 1 each 12   furosemide  (LASIX ) 20 MG tablet Take 1 tablet (20 mg total) by mouth daily as needed (For swelling or edema). 30 tablet 1   HYDROcodone -acetaminophen  (NORCO) 7.5-325 MG tablet Take 1 tablet by mouth every 6 (six) hours as needed for severe pain (pain score 7-10) (for chronic joint pain, neck pain). 90 tablet 0   LORazepam  (ATIVAN ) 1 MG tablet TAKE 1/2 TO 1 TABLET BY MOUTH NIGHTLY AS NEEDED. 30 tablet 0   metFORMIN  (GLUCOPHAGE ) 500 MG tablet TAKE 1 TABLET BY MOUTH TWICE A DAY WITH FOOD 180 tablet 1   montelukast  (SINGULAIR ) 10 MG tablet Take 1 tablet (  10 mg total) by mouth at bedtime. 100 tablet 3   nebivolol  (BYSTOLIC ) 5 MG tablet Take 1 tablet (5 mg total) by mouth daily. 90 tablet 3   nitroGLYCERIN  (NITROSTAT ) 0.4 MG SL tablet Place 1 tablet (0.4 mg total) under the tongue every 5 (five) minutes x 3 doses as needed for chest pain. 75 tablet 1   predniSONE  (DELTASONE ) 20 MG tablet Take 2 tablets (40 mg total) by mouth daily with breakfast. 10 tablet 0   Respiratory Therapy Supplies (FLUTTER) DEVI Twice a day and prn as needed, may increase if feeling worse 1 each 0   rosuvastatin  (CRESTOR ) 10 MG tablet TAKE 1 TABLET ON MONDAY, WEDNESDAY & FRIDAY. 45 tablet 3   vardenafil  (LEVITRA ) 20 MG tablet TAKE 1 TABLET BY MOUTH AS DIRECTED. CAN NOT BE TAKEN WITH NITROGLYCERIN . NOT COVERED BY INSURANCE 6 tablet 2   No  current facility-administered medications on file prior to visit.    Review of Systems  Constitutional:  Negative for fever.  HENT:  Positive for rhinorrhea.        Occ ETD  Respiratory:  Positive for cough (productive), chest tightness, shortness of breath and wheezing.   Gastrointestinal:  Negative for abdominal pain, blood in stool (no melena), constipation (transient mild) and diarrhea.  Neurological:  Negative for dizziness and headaches.       Objective:   Vitals:   05/04/24 1355  BP: 108/64  Pulse: 61  Temp: 97.8 F (36.6 C)  SpO2: 98%   BP Readings from Last 3 Encounters:  05/04/24 108/64  04/13/24 116/68  02/20/24 130/82   Wt Readings from Last 3 Encounters:  05/04/24 158 lb (71.7 kg)  04/13/24 169 lb (76.7 kg)  02/20/24 168 lb (76.2 kg)   Body mass index is 23.33 kg/m.    Physical Exam Constitutional:      General: He is not in acute distress.    Appearance: Normal appearance. He is not ill-appearing.  HENT:     Head: Normocephalic and atraumatic.  Cardiovascular:     Rate and Rhythm: Normal rate and regular rhythm.  Pulmonary:     Effort: Pulmonary effort is normal. No respiratory distress.     Breath sounds: Wheezing (Diffuse expiratory wheeze) and rales (Bibasilar) present.  Musculoskeletal:     Right lower leg: Edema (1+ pitting) present.     Left lower leg: Edema (1+ pitting) present.  Skin:    General: Skin is warm and dry.     Findings: No rash.  Neurological:     Mental Status: He is alert.            Assessment & Plan:    See Problem List for Assessment and Plan of chronic medical problems.

## 2024-05-03 NOTE — Patient Instructions (Addendum)
    Steroid injection today   Blood work and chest xray today    Medications changes include : none         Return if symptoms worsen or fail to improve.

## 2024-05-04 ENCOUNTER — Ambulatory Visit (INDEPENDENT_AMBULATORY_CARE_PROVIDER_SITE_OTHER): Admitting: Internal Medicine

## 2024-05-04 ENCOUNTER — Ambulatory Visit: Payer: Self-pay | Admitting: Internal Medicine

## 2024-05-04 ENCOUNTER — Ambulatory Visit (INDEPENDENT_AMBULATORY_CARE_PROVIDER_SITE_OTHER)

## 2024-05-04 VITALS — BP 108/64 | HR 61 | Temp 97.8°F | Ht 69.0 in | Wt 158.0 lb

## 2024-05-04 DIAGNOSIS — Z125 Encounter for screening for malignant neoplasm of prostate: Secondary | ICD-10-CM

## 2024-05-04 DIAGNOSIS — J4541 Moderate persistent asthma with (acute) exacerbation: Secondary | ICD-10-CM

## 2024-05-04 DIAGNOSIS — I10 Essential (primary) hypertension: Secondary | ICD-10-CM | POA: Diagnosis not present

## 2024-05-04 DIAGNOSIS — E1159 Type 2 diabetes mellitus with other circulatory complications: Secondary | ICD-10-CM

## 2024-05-04 DIAGNOSIS — R634 Abnormal weight loss: Secondary | ICD-10-CM | POA: Insufficient documentation

## 2024-05-04 DIAGNOSIS — R059 Cough, unspecified: Secondary | ICD-10-CM | POA: Diagnosis not present

## 2024-05-04 DIAGNOSIS — Z950 Presence of cardiac pacemaker: Secondary | ICD-10-CM | POA: Diagnosis not present

## 2024-05-04 DIAGNOSIS — J45901 Unspecified asthma with (acute) exacerbation: Secondary | ICD-10-CM | POA: Diagnosis not present

## 2024-05-04 LAB — CBC WITH DIFFERENTIAL/PLATELET
Basophils Absolute: 0 10*3/uL (ref 0.0–0.1)
Basophils Relative: 0.1 % (ref 0.0–3.0)
Eosinophils Absolute: 0 10*3/uL (ref 0.0–0.7)
Eosinophils Relative: 0.1 % (ref 0.0–5.0)
HCT: 46.1 % (ref 39.0–52.0)
Hemoglobin: 15 g/dL (ref 13.0–17.0)
Lymphocytes Relative: 3.3 % — ABNORMAL LOW (ref 12.0–46.0)
Lymphs Abs: 0.5 10*3/uL — ABNORMAL LOW (ref 0.7–4.0)
MCHC: 32.5 g/dL (ref 30.0–36.0)
MCV: 82.8 fl (ref 78.0–100.0)
Monocytes Absolute: 0.7 10*3/uL (ref 0.1–1.0)
Monocytes Relative: 4.6 % (ref 3.0–12.0)
Neutro Abs: 13 10*3/uL — ABNORMAL HIGH (ref 1.4–7.7)
Neutrophils Relative %: 91.9 % — ABNORMAL HIGH (ref 43.0–77.0)
Platelets: 250 10*3/uL (ref 150.0–400.0)
RBC: 5.57 Mil/uL (ref 4.22–5.81)
RDW: 17.1 % — ABNORMAL HIGH (ref 11.5–15.5)
WBC: 14.1 10*3/uL — ABNORMAL HIGH (ref 4.0–10.5)

## 2024-05-04 LAB — COMPREHENSIVE METABOLIC PANEL WITH GFR
ALT: 24 U/L (ref 0–53)
AST: 18 U/L (ref 0–37)
Albumin: 4 g/dL (ref 3.5–5.2)
Alkaline Phosphatase: 67 U/L (ref 39–117)
BUN: 36 mg/dL — ABNORMAL HIGH (ref 6–23)
CO2: 32 meq/L (ref 19–32)
Calcium: 9.4 mg/dL (ref 8.4–10.5)
Chloride: 95 meq/L — ABNORMAL LOW (ref 96–112)
Creatinine, Ser: 1.24 mg/dL (ref 0.40–1.50)
GFR: 54.83 mL/min — ABNORMAL LOW (ref >60.00)
Glucose, Bld: 127 mg/dL — ABNORMAL HIGH (ref 70–99)
Potassium: 4.7 meq/L (ref 3.5–5.1)
Sodium: 139 meq/L (ref 135–145)
Total Bilirubin: 0.5 mg/dL (ref 0.2–1.2)
Total Protein: 6.5 g/dL (ref 6.0–8.3)

## 2024-05-04 LAB — TSH: TSH: 0.75 u[IU]/mL (ref 0.35–5.50)

## 2024-05-04 LAB — PSA, MEDICARE: PSA: 1.09 ng/mL (ref 0.10–4.00)

## 2024-05-04 MED ORDER — GLIPIZIDE 5 MG PO TABS: 5.0000 mg | ORAL_TABLET | Freq: Two times a day (BID) | ORAL | 0 refills | Status: DC

## 2024-05-04 MED ORDER — METHYLPREDNISOLONE ACETATE 80 MG/ML IJ SUSP
80.0000 mg | Freq: Once | INTRAMUSCULAR | Status: AC
  Administered 2024-05-04: 80 mg via INTRAMUSCULAR

## 2024-05-04 MED ORDER — IPRATROPIUM BROMIDE 0.02 % IN SOLN: 0.5000 mg | Freq: Four times a day (QID) | RESPIRATORY_TRACT | Status: DC

## 2024-05-04 NOTE — Assessment & Plan Note (Signed)
 For the past several months he has lost a significant amount of weight-almost 30 pounds over several months He knows he is eating less-appetite is less He does state men in his family have all lost weight at this time their life so he is unsure if it is genetic or if there is something more going on Denies any concerning symptoms Will check CBC, CMP, TSH and PSA Encouraged him to maintain good calorie intake and monitor his weight If blood work negative and he continues to lose weight will need a CT abdomen pelvis CT chest was done 6 months ago

## 2024-05-04 NOTE — Assessment & Plan Note (Signed)
 Subacute Persistent asthma exacerbation with diffuse expiratory wheeze He has progressed little since he was here last, but not enough Still on prednisone  20 mg daily and has 4 days left Chest x-ray today Depo-Medrol  80 mg IM today Continue regular nebulizers/inhalers He does have a flutter valve that he is using regularly he will update me on how he is doing next week

## 2024-05-04 NOTE — Assessment & Plan Note (Signed)
Chronic Blood pressure well controlled Continue Bystolic 5 mg daily

## 2024-05-06 ENCOUNTER — Other Ambulatory Visit: Payer: Self-pay | Admitting: Internal Medicine

## 2024-05-08 MED ORDER — FREESTYLE LIBRE 14 DAY SENSOR MISC: 3 refills | Status: DC

## 2024-05-10 ENCOUNTER — Other Ambulatory Visit: Payer: Self-pay | Admitting: Internal Medicine

## 2024-05-10 ENCOUNTER — Other Ambulatory Visit: Payer: Self-pay

## 2024-05-10 MED ORDER — HYDROCODONE-ACETAMINOPHEN 7.5-325 MG PO TABS
1.0000 | ORAL_TABLET | Freq: Four times a day (QID) | ORAL | 0 refills | Status: DC | PRN
  Filled 2024-05-10 – 2024-05-11 (×2): qty 90, 23d supply, fill #0

## 2024-05-11 ENCOUNTER — Other Ambulatory Visit: Payer: Self-pay

## 2024-05-11 ENCOUNTER — Other Ambulatory Visit: Payer: Self-pay | Admitting: Internal Medicine

## 2024-05-13 ENCOUNTER — Other Ambulatory Visit: Payer: Self-pay | Admitting: Internal Medicine

## 2024-05-16 ENCOUNTER — Other Ambulatory Visit: Payer: Self-pay

## 2024-05-18 ENCOUNTER — Other Ambulatory Visit: Payer: Self-pay | Admitting: Internal Medicine

## 2024-05-30 ENCOUNTER — Encounter: Payer: Self-pay | Admitting: Internal Medicine

## 2024-05-30 ENCOUNTER — Other Ambulatory Visit: Payer: Self-pay

## 2024-05-31 MED ORDER — IPRATROPIUM BROMIDE 0.02 % IN SOLN: 0.5000 mg | Freq: Four times a day (QID) | RESPIRATORY_TRACT | 3 refills | Status: DC

## 2024-05-31 NOTE — Progress Notes (Signed)
 Remote pacemaker transmission.

## 2024-06-01 ENCOUNTER — Encounter: Payer: Self-pay | Admitting: Internal Medicine

## 2024-06-01 ENCOUNTER — Ambulatory Visit: Payer: Self-pay

## 2024-06-01 NOTE — Telephone Encounter (Signed)
 Reason for Triage: pt states he first came to Pulmonary in 2020.  So he is new.  Pt states he has highly prod cough.  Asthma.    Taking breathing treatments at home. Continuously coughing    Needs appt asap.  PCP no longer able to manage   Patient transferred to PAS to assist with scheduling new patient appointment.   Reason for Disposition  Cough lasts > 3 weeks  Answer Assessment - Initial Assessment Questions 1. ONSET: When did the cough begin?      Chronic problem  2. SEVERITY: How bad is the cough today?      Mild to moderate  3. SPUTUM: Describe the color of your sputum (none, dry cough; clear, white, yellow, green)     Has not checked color  4. HEMOPTYSIS: Are you coughing up any blood? If so ask: How much? (flecks, streaks, tablespoons, etc.)     No 5. DIFFICULTY BREATHING: Are you having difficulty breathing? If Yes, ask: How bad is it? (e.g., mild, moderate, severe)    - MILD: No SOB at rest, mild SOB with walking, speaks normally in sentences, can lie down, no retractions, pulse < 100.    - MODERATE: SOB at rest, SOB with minimal exertion and prefers to sit, cannot lie down flat, speaks in phrases, mild retractions, audible wheezing, pulse 100-120.    - SEVERE: Very SOB at rest, speaks in single words, struggling to breathe, sitting hunched forward, retractions, pulse > 120      Mild, uses breathing treatments as prescribed 6. FEVER: Do you have a fever? If Yes, ask: What is your temperature, how was it measured, and when did it start?     No 7. CARDIAC HISTORY: Do you have any history of heart disease? (e.g., heart attack, congestive heart failure)      Yes 8. LUNG HISTORY: Do you have any history of lung disease?  (e.g., pulmonary embolus, asthma, emphysema)     Yes 9. PE RISK FACTORS: Do you have a history of blood clots? (or: recent major surgery, recent prolonged travel, bedridden)     No 10. OTHER SYMPTOMS: Do you have any other symptoms?  (e.g., runny nose, wheezing, chest pain)       No  Protocols used: Cough - Chronic-A-AH

## 2024-06-13 ENCOUNTER — Other Ambulatory Visit: Payer: Self-pay | Admitting: Internal Medicine

## 2024-06-14 ENCOUNTER — Other Ambulatory Visit: Payer: Self-pay

## 2024-06-14 MED ORDER — HYDROCODONE-ACETAMINOPHEN 7.5-325 MG PO TABS
1.0000 | ORAL_TABLET | Freq: Four times a day (QID) | ORAL | 0 refills | Status: DC | PRN
  Filled 2024-06-14 – 2024-07-05 (×2): qty 90, 23d supply, fill #0

## 2024-06-26 ENCOUNTER — Other Ambulatory Visit: Payer: Self-pay

## 2024-07-04 ENCOUNTER — Encounter: Payer: Self-pay | Admitting: Cardiovascular Disease

## 2024-07-05 ENCOUNTER — Other Ambulatory Visit: Payer: Self-pay

## 2024-07-17 ENCOUNTER — Ambulatory Visit: Attending: Cardiovascular Disease | Admitting: Cardiovascular Disease

## 2024-07-17 ENCOUNTER — Encounter: Payer: Self-pay | Admitting: Cardiovascular Disease

## 2024-07-17 VITALS — BP 106/60 | HR 67 | Ht 69.0 in | Wt 160.2 lb

## 2024-07-17 DIAGNOSIS — I2510 Atherosclerotic heart disease of native coronary artery without angina pectoris: Secondary | ICD-10-CM | POA: Diagnosis not present

## 2024-07-17 DIAGNOSIS — R0609 Other forms of dyspnea: Secondary | ICD-10-CM | POA: Diagnosis not present

## 2024-07-17 DIAGNOSIS — I10 Essential (primary) hypertension: Secondary | ICD-10-CM | POA: Diagnosis not present

## 2024-07-17 DIAGNOSIS — I447 Left bundle-branch block, unspecified: Secondary | ICD-10-CM | POA: Diagnosis not present

## 2024-07-17 DIAGNOSIS — R0602 Shortness of breath: Secondary | ICD-10-CM | POA: Diagnosis not present

## 2024-07-17 DIAGNOSIS — E782 Mixed hyperlipidemia: Secondary | ICD-10-CM

## 2024-07-17 NOTE — Assessment & Plan Note (Signed)
 Chronic

## 2024-07-17 NOTE — Patient Instructions (Signed)
 Medication Instructions:  Your physician recommends that you continue on your current medications as directed. Please refer to the Current Medication list given to you today.  *If you need a refill on your cardiac medications before your next appointment, please call your pharmacy*  Lab Work: Your physician recommends that you have labs drawn today: BNP  If you have labs (blood work) drawn today and your tests are completely normal, you will receive your results only by: MyChart Message (if you have MyChart) OR A paper copy in the mail If you have any lab test that is abnormal or we need to change your treatment, we will call you to review the results.  Testing/Procedures: Your physician has requested that you have an echocardiogram. Echocardiography is a painless test that uses sound waves to create images of your heart. It provides your doctor with information about the size and shape of your heart and how well your heart's chambers and valves are working. This procedure takes approximately one hour. There are no restrictions for this procedure. Please do NOT wear cologne, perfume, aftershave, or lotions (deodorant is allowed). Please arrive 15 minutes prior to your appointment time.  Please note: We ask at that you not bring children with you during ultrasound (echo/ vascular) testing. Due to room size and safety concerns, children are not allowed in the ultrasound rooms during exams. Our front office staff cannot provide observation of children in our lobby area while testing is being conducted. An adult accompanying a patient to their appointment will only be allowed in the ultrasound room at the discretion of the ultrasound technician under special circumstances. We apologize for any inconvenience.   Dr. Court has ordered a Lexiscan  Myocardial Perfusion Imaging Study.  Please arrive 15 minutes prior to your appointment time for registration and insurance purposes.   The test will take  approximately 3 to 4 hours to complete; you may bring reading material.  If someone comes with you to your appointment, they will need to remain in the main lobby due to limited space in the testing area. **If you are pregnant or breastfeeding, please notify the nuclear lab prior to your appointment**   How to prepare for your Myocardial Perfusion Test: Do not eat or drink 3 hours prior to your test, except you may have water. Do not consume products containing caffeine (regular or decaffeinated) 12 hours prior to your test. (ex: coffee, chocolate, sodas, tea). Do wear comfortable clothes (no dresses or overalls) and walking shoes, tennis shoes preferred (No heels or open toe shoes are allowed). Do NOT wear cologne, perfume, aftershave, or lotions (deodorant is allowed). If you use an inhaler, use it the AM of your test and bring it with you.  If you use a nebulizer, use it the AM of your test.  If these instructions are not followed, your test will have to be rescheduled.    Follow-Up: At [facility], you and your health needs are our priority.  As part of our continuing mission to provide you with exceptional heart care, our providers are all part of one team.  This team includes your primary Cardiologist (physician) and Advanced Practice Providers or APPs (Physician Assistants and Nurse Practitioners) who all work together to provide you with the care you need, when you need it.  Your next appointment:   3 month(s)  Provider:   Dorn Court, MD   We recommend signing up for the patient portal called MyChart.  Sign up information is provided  on this After Visit Summary.  MyChart is used to connect with patients for Virtual Visits (Telemedicine).  Patients are able to view lab/test results, encounter notes, upcoming appointments, etc.  Non-urgent messages can be sent to your provider as well.   To learn more about what you can do with MyChart, go to [email or web address].

## 2024-07-17 NOTE — Assessment & Plan Note (Signed)
 History of essential hypertension with blood pressure measured today at 106/60.  He is on Bystolic .

## 2024-07-17 NOTE — Progress Notes (Signed)
 07/17/2024 Jeffery [name]   22-Jul-1941  [id number]  Primary Physician Geofm, Glade PARAS, MD Primary Cardiologist: Dorn PARAS Lesches MD GENI CODY [place], [place]  HPI:  Jeffery [name] is a 83 y.o.   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  He trained the [employer] [place] .  He was previously a patient of Dr. Claudene.  His risk factors include treated hypertension, diabetes and hyperlipidemia.   He had a heart cath in 2002 that showed mild nonobstructive CAD.  He has chronic combined systolic and diastolic heart failure with an EF in the 40% range as well as chronic left bundle branch block.  He had a biventricular pacemaker placed by Dr. Waddell 07/25/2018 for second-degree AV block and Stokes-Adams syncope.  He was recently hospitalized for pneumonia.  Since I saw him 7 months ago he has had progressive dyspnea.  He thinks this may be related to reactive airways disease and allergies.  He denies chest pain.  Does have a appointment with Dr. Geronimo, pulmonologist, next month.  His PCP did get a chest x-ray that showed no active disease.   Current Meds  Medication Sig   ACCU-CHEK AVIVA PLUS test strip USE AS INSTRUCTED TO TEST BLOOD SUGAR 6 TIMES DAILY. DX CODE: E11.59   ACCU-CHEK SOFTCLIX LANCETS lancets Use to test blood sugar daily as directed.   albuterol  (PROVENTIL ) (2.5 MG/3ML) 0.083% nebulizer solution Take 3 mLs (2.5 mg total) by nebulization every 6 (six) hours as needed for wheezing or shortness of breath.   albuterol  (VENTOLIN  HFA) 108 (90 Base) MCG/ACT inhaler TAKE 2 PUFFS BY MOUTH EVERY 6 HOURS AS NEEDED FOR WHEEZE OR SHORTNESS OF BREATH   aspirin  EC 81 MG tablet Take 1 tablet (81 mg total) by mouth at bedtime. Swallow whole.   celecoxib  (CELEBREX ) 100 MG capsule TAKE 1 CAPSULE BY MOUTH TWICE A DAY   Continuous Blood Gluc Receiver (FREESTYLE LIBRE READER) DEVI 1 Device  by Does not apply route every 14 (fourteen) days. Freestyle libre 3 reader   Continuous Glucose Sensor (FREESTYLE LIBRE 14 DAY SENSOR) MISC APPLY 1 SENSOR EVERY 14 DAYS   dapagliflozin  propanediol (FARXIGA ) 10 MG TABS tablet Take 1 tablet (10 mg total) by mouth daily before breakfast.   fluticasone -salmeterol (ADVAIR HFA) 230-21 MCG/ACT inhaler Inhale 2 puffs into the lungs 2 (two) times daily.   furosemide  (LASIX ) 20 MG tablet Take 1 tablet (20 mg total) by mouth daily as needed (For swelling or edema).   glipiZIDE  (GLUCOTROL ) 5 MG tablet TAKE 1-2 TABLETS (5-10 MG TOTAL) BY MOUTH 2 (TWO) TIMES DAILY BEFORE A MEAL.   HYDROcodone -acetaminophen  (NORCO) 7.5-325 MG tablet Take 1 tablet by mouth every 6 (six) hours as needed for severe pain (pain score 7-10) (for chronic joint pain, neck pain).   ipratropium (ATROVENT ) 0.02 % nebulizer solution Take 2.5 mLs (0.5 mg total) by nebulization 4 (four) times daily.   LORazepam  (ATIVAN ) 1 MG tablet TAKE 1/2 TO 1 TABLET BY MOUTH NIGHTLY AS NEEDED.   metFORMIN  (GLUCOPHAGE ) 500 MG tablet TAKE 1 TABLET BY MOUTH TWICE A DAY WITH FOOD   montelukast  (SINGULAIR ) 10 MG tablet Take 1 tablet (10 mg total) by mouth at bedtime.   nebivolol  (BYSTOLIC ) 5 MG tablet Take 1 tablet (5 mg total) by mouth daily.   nitroGLYCERIN  (NITROSTAT ) 0.4 MG SL tablet Place 1 tablet (0.4 mg total) under the tongue every 5 (  five) minutes x 3 doses as needed for chest pain.   predniSONE  (DELTASONE ) 20 MG tablet TAKE 2 TABLETS BY MOUTH DAILY WITH BREAKFAST.   Respiratory Therapy Supplies (FLUTTER) DEVI Twice a day and prn as needed, may increase if feeling worse   rosuvastatin  (CRESTOR ) 10 MG tablet TAKE 1 TABLET ON MONDAY, WEDNESDAY & FRIDAY.   vardenafil  (LEVITRA ) 20 MG tablet TAKE 1 TABLET BY MOUTH AS DIRECTED. CAN NOT BE TAKEN WITH NITROGLYCERIN . NOT COVERED BY INSURANCE     Allergies  Allergen Reactions   Contrast Media [Iodinated Contrast Media] Other (See Comments)    Redness and warm  sensation, this reaction was noted on 02/27/13 during a Cardiac MRI per pt.  Pt sts he had erythema on his head, chest, and shoulders.  Pt had a pacemaker placed August 2019 and was pre-medicated for the dye and had no allergic reaction at that time. -Carissa Mozingo B.S. RT(R)(CT)    Social History   Socioeconomic History   Marital status: Married    Spouse name: Not on file   Number of children: 3   Years of education: Not on file   Highest education level: Professional school degree (e.g., MD, DDS, DVM, JD)  Occupational History   Occupation: retired Scientist, forensic: [employer]  Tobacco Use   Smoking status: Former    Current packs/day: 0.00    Average packs/day: 2.0 packs/day for 2.0 years (4.0 ttl pk-yrs)    Types: Cigarettes    Start date: 12/13/1961    Quit date: 12/14/1963    Years since quitting: 60.6   Smokeless tobacco: Never  Vaping Use   Vaping status: Never Used  Substance and Sexual Activity   Alcohol use: Not Currently    Alcohol/week: 2.0 standard drinks of alcohol    Types: 2 Glasses of wine per week    Comment: occasional   Drug use: Never   Sexual activity: Yes  Other Topics Concern   Not on file  Social History Narrative   Lives with wife in a 2 story home.  Has 3 daughters.  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     Social Drivers of Corporate investment banker Strain: Low Risk  (06/07/2023)   Overall Financial Resource Strain (CARDIA)    Difficulty of Paying Living Expenses: Not hard at all  Food Insecurity: No Food Insecurity (12/04/2023)   Hunger Vital Sign    Worried About Running Out of Food in the Last Year: Never true    Ran Out of Food in the Last Year: Never true  Transportation Needs: No Transportation Needs (12/04/2023)   PRAPARE - Administrator, Civil Service (Medical): No    Lack of Transportation (Non-Medical): No  Physical Activity: Sufficiently Active (06/07/2023)   Exercise Vital Sign    Days of Exercise per  Week: 5 days    Minutes of Exercise per Session: 30 min  Stress: No Stress Concern Present (06/07/2023)   [employer] of Occupational Health - Occupational Stress Questionnaire    Feeling of Stress : Not at all  Social Connections: Socially Integrated (06/07/2023)   Social Connection and Isolation Panel    Frequency of Communication with Friends and Family: More than three times a week    Frequency of Social Gatherings with Friends and Family: More than three times a week    Attends Religious Services: More than 4 times per year    Active Member of [employer] or Organizations: Yes  Attends Banker Meetings: More than 4 times per year    Marital Status: Married  Catering manager Violence: Not At Risk (12/04/2023)   Humiliation, Afraid, Rape, and Kick questionnaire    Fear of Current or Ex-Partner: No    Emotionally Abused: No    Physically Abused: No    Sexually Abused: No     Review of Systems: General: negative for chills, fever, night sweats or weight changes.  Cardiovascular: negative for chest pain, dyspnea on exertion, edema, orthopnea, palpitations, paroxysmal nocturnal dyspnea or shortness of breath Dermatological: negative for rash Respiratory: negative for cough or wheezing Urologic: negative for hematuria Abdominal: negative for nausea, vomiting, diarrhea, bright red blood per rectum, melena, or hematemesis Neurologic: negative for visual changes, syncope, or dizziness All other systems reviewed and are otherwise negative except as noted above.    Blood pressure 106/60, pulse 67, height 5' 9 (1.753 m), weight 160 lb 3.2 oz (72.7 kg), SpO2 90%.  General appearance: alert and no distress Neck: no adenopathy, no carotid bruit, no JVD, supple, symmetrical, trachea midline, and thyroid  not enlarged, symmetric, no tenderness/mass/nodules Lungs: Bilateral wheezes and rhonchi Heart: regular rate and rhythm, S1, S2 normal, no murmur, click, rub or  gallop Extremities: Trace bilateral lower extremity edema Pulses: 2+ and symmetric Skin: Skin color, texture, turgor normal. No rashes or lesions Neurologic: Grossly normal  EKG EKG Interpretation Date/Time:  Tuesday July 17 2024 13:18:55 EDT Ventricular Rate:  67 PR Interval:  240 QRS Duration:  158 QT Interval:  486 QTC Calculation: 513 R Axis:   183  Text Interpretation: Atrial-sensed ventricular-paced rhythm with prolonged AV conduction with occasional Premature ventricular complexes When compared with ECG of 04-Dec-2023 14:19, Premature ventricular complexes are now Present Vent. rate has decreased BY   8 BPM Confirmed by Court Carrier [phone]) on 07/17/2024 1:22:56 PM    ASSESSMENT AND PLAN:   HLD (hyperlipidemia) History of hyperlipidemia on statin therapy with lipid profile performed 02/20/2024 revealing total cholesterol of 116, LDL 57 and HDL 39.  Coronary atherosclerosis History of minimal CAD by heart cath performed in 2002.  Left bundle branch block Chronic  Essential hypertension History of essential hypertension with blood pressure measured today at 106/60.  He is on Bystolic .  Pacemaker History of permanent transvenous pacemaker placed by Dr. Waddell 07/25/2018 for second-degree AV block and Stokes-Adams syncope.  Exertional shortness of breath Patient has worsening dyspnea on exertion.  He does have reactive airways disease.  His EF is in the 40% range.  He appears euvolemic on exam.  I am going to get a 2D echocardiogram, Lexiscan  Myoview  stress test and a proBNP to further evaluate.  He has an appointment coming up with Dr. Geronimo for pulmonary evaluation is as well.  On exam today he does have diffuse wheezes and rhonchi.     Carrier DOROTHA Court MD FACP,FACC,FAHA, [facility] 07/17/2024 1:38 PM

## 2024-07-17 NOTE — Assessment & Plan Note (Signed)
 History of hyperlipidemia on statin therapy with lipid profile performed 02/20/2024 revealing total cholesterol of 116, LDL 57 and HDL 39.

## 2024-07-17 NOTE — Assessment & Plan Note (Signed)
 Patient has worsening dyspnea on exertion.  He does have reactive airways disease.  His EF is in the 40% range.  He appears euvolemic on exam.  I am going to get a 2D echocardiogram, Lexiscan  Myoview  stress test and a proBNP to further evaluate.  He has an appointment coming up with Dr. Geronimo for pulmonary evaluation is as well.  On exam today he does have diffuse wheezes and rhonchi.

## 2024-07-17 NOTE — Assessment & Plan Note (Signed)
 History of permanent transvenous pacemaker placed by Dr. Waddell 07/25/2018 for second-degree AV block and Stokes-Adams syncope.

## 2024-07-17 NOTE — Assessment & Plan Note (Signed)
 History of minimal CAD by heart cath performed in 2002.

## 2024-07-18 ENCOUNTER — Ambulatory Visit: Payer: Self-pay | Admitting: Cardiovascular Disease

## 2024-07-18 DIAGNOSIS — Z79899 Other long term (current) drug therapy: Secondary | ICD-10-CM

## 2024-07-18 DIAGNOSIS — R7989 Other specified abnormal findings of blood chemistry: Secondary | ICD-10-CM

## 2024-07-18 LAB — PRO B NATRIURETIC PEPTIDE: NT-Pro BNP: 4313 pg/mL — ABNORMAL HIGH (ref 0–486)

## 2024-07-18 NOTE — Telephone Encounter (Signed)
 Left detailed message for pt to call back to place orders and further discuss.

## 2024-07-19 MED ORDER — POTASSIUM CHLORIDE ER 20 MEQ PO TBCR: 10.0000 meq | EXTENDED_RELEASE_TABLET | Freq: Every day | ORAL | 3 refills | Status: DC

## 2024-07-19 MED ORDER — FUROSEMIDE 20 MG PO TABS: ORAL_TABLET | ORAL | 3 refills | Status: DC

## 2024-07-24 ENCOUNTER — Telehealth (HOSPITAL_COMMUNITY): Payer: Self-pay

## 2024-07-24 NOTE — Telephone Encounter (Signed)
 Detailed instructions left on the patient's answering machine. S.Tayjah Lobdell CCT

## 2024-07-27 ENCOUNTER — Ambulatory Visit: Attending: Internal Medicine

## 2024-07-27 DIAGNOSIS — I441 Atrioventricular block, second degree: Secondary | ICD-10-CM | POA: Diagnosis not present

## 2024-07-30 ENCOUNTER — Other Ambulatory Visit: Payer: Self-pay | Admitting: Cardiovascular Disease

## 2024-07-30 DIAGNOSIS — I10 Essential (primary) hypertension: Secondary | ICD-10-CM

## 2024-07-30 DIAGNOSIS — E782 Mixed hyperlipidemia: Secondary | ICD-10-CM

## 2024-07-30 DIAGNOSIS — I2510 Atherosclerotic heart disease of native coronary artery without angina pectoris: Secondary | ICD-10-CM

## 2024-07-30 DIAGNOSIS — R0609 Other forms of dyspnea: Secondary | ICD-10-CM

## 2024-07-30 DIAGNOSIS — I447 Left bundle-branch block, unspecified: Secondary | ICD-10-CM

## 2024-07-30 LAB — CUP PACEART REMOTE DEVICE CHECK
Battery Remaining Longevity: 20 mo
Battery Remaining Percentage: 23 %
Battery Voltage: 2.92 V
Brady Statistic AP VP Percent: 26 %
Brady Statistic AP VS Percent: 1 %
Brady Statistic AS VP Percent: 68 %
Brady Statistic AS VS Percent: 3.1 %
Brady Statistic RA Percent Paced: 24 %
Date Time Interrogation Session: 20250815020009
Implantable Lead Connection Status: 753985
Implantable Lead Connection Status: 753985
Implantable Lead Connection Status: 753985
Implantable Lead Implant Date: 20190813
Implantable Lead Implant Date: 20190813
Implantable Lead Implant Date: 20190813
Implantable Lead Location: 753858
Implantable Lead Location: 753859
Implantable Lead Location: 753860
Implantable Pulse Generator Implant Date: 20190813
Lead Channel Impedance Value: 380 Ohm
Lead Channel Impedance Value: 390 Ohm
Lead Channel Impedance Value: 540 Ohm
Lead Channel Pacing Threshold Amplitude: 0.5 V
Lead Channel Pacing Threshold Amplitude: 0.75 V
Lead Channel Pacing Threshold Amplitude: 1.125 V
Lead Channel Pacing Threshold Pulse Width: 0.5 ms
Lead Channel Pacing Threshold Pulse Width: 0.5 ms
Lead Channel Pacing Threshold Pulse Width: 0.5 ms
Lead Channel Sensing Intrinsic Amplitude: 1.5 mV
Lead Channel Sensing Intrinsic Amplitude: 8.1 mV
Lead Channel Setting Pacing Amplitude: 1.75 V
Lead Channel Setting Pacing Amplitude: 2.125
Lead Channel Setting Pacing Amplitude: 2.5 V
Lead Channel Setting Pacing Pulse Width: 0.5 ms
Lead Channel Setting Pacing Pulse Width: 0.5 ms
Lead Channel Setting Sensing Sensitivity: 5 mV
Pulse Gen Model: 3562
Pulse Gen Serial Number: 9043864

## 2024-07-31 ENCOUNTER — Ambulatory Visit (HOSPITAL_COMMUNITY)
Admission: RE | Admit: 2024-07-31 | Discharge: 2024-07-31 | Disposition: A | Discharge status: Home / Self Care | Source: Ambulatory Visit | Attending: Internal Medicine | Admitting: Internal Medicine

## 2024-07-31 DIAGNOSIS — I447 Left bundle-branch block, unspecified: Secondary | ICD-10-CM | POA: Diagnosis not present

## 2024-07-31 DIAGNOSIS — R0609 Other forms of dyspnea: Secondary | ICD-10-CM | POA: Diagnosis not present

## 2024-07-31 DIAGNOSIS — I10 Essential (primary) hypertension: Secondary | ICD-10-CM | POA: Insufficient documentation

## 2024-07-31 DIAGNOSIS — I2510 Atherosclerotic heart disease of native coronary artery without angina pectoris: Secondary | ICD-10-CM | POA: Diagnosis not present

## 2024-07-31 DIAGNOSIS — E782 Mixed hyperlipidemia: Secondary | ICD-10-CM | POA: Insufficient documentation

## 2024-07-31 LAB — MYOCARDIAL PERFUSION IMAGING
LV dias vol: 173 mL (ref 62–150)
LV sys vol: 122 mL (ref 4.2–5.8)
Nuc Stress EF: 29 %
Peak HR: 75 {beats}/min
Rest HR: 70 {beats}/min
Rest Nuclear Isotope Dose: 10.9 mCi
SDS: 3
SRS: 5
SSS: 8
ST Depression (mm): 0 mm
TID: 1.07

## 2024-07-31 MED ORDER — REGADENOSON 0.4 MG/5ML IV SOLN
INTRAVENOUS | Status: AC
  Filled 2024-07-31: qty 5

## 2024-07-31 MED ORDER — TECHNETIUM TC 99M TETROFOSMIN IV KIT
10.9000 | PACK | Freq: Once | INTRAVENOUS | Status: AC | PRN
  Administered 2024-07-31: 10.9 via INTRAVENOUS

## 2024-08-02 DIAGNOSIS — R7989 Other specified abnormal findings of blood chemistry: Secondary | ICD-10-CM | POA: Diagnosis not present

## 2024-08-02 NOTE — Progress Notes (Unsigned)
 OV 08/03/2024 last seen over 5 years ago in this practice therefore this is a new patient  Subjective:  Patient ID: Jeffery [name], male , DOB: May 25, 1941 , age 83 y.o. , MRN: [id number] , ADDRESS: [address] [place] [id number] PCP Cleotilde Planas, MD Patient Care Team: Cleotilde Planas, MD as PCP - General (Family Medicine) Waddell Danelle ORN, MD as PCP - Electrophysiology (Cardiology) Pa, [facility] (Neurosurgery) Fate Morna SAILOR, [facility] (Inactive) as Pharmacist (Pharmacist) Charmayne Molly, MD as Consulting Physician (Ophthalmology)  This Provider for this visit: Treatment Team:  Attending Provider: Geronimo Amel, MD    08/03/2024 -   Chief Complaint  Patient presents with   Consult    Seen in the past for PNA by Dr. Brenna. Increased SOB, prod cough with yellow/green sputum. Symptoms seem worse at night.     HPI Jeffery [name] 83 y.o. -retired Scientist, clinical (histocompatibility and immunogenetics) who is known to have chronic systolic heart failure at least since 2019 echo in persistent in 2023 in December 2024 echo EF less than 40%.  He has chronic left bundle branch block.  Status post biventricular pacemaker August 2019 for second-degree AV block and Stokoderm syncope he follows with Dr. Dorn Lesches.    He is here because of cough shortness of breath and sputum production that is green/yellow.  He tells me that he has a lifelong history of asthma with exacerbations on and off over the years in 2019 he got hospitalized for what to him felt like COVID [COVID test not available at that time].  He also had a foot drop.  Over time he improved after that he was not following up with pulmonary.  He is doing well.  Then in December 2024 he got hospitalized again.  Official chart diagnosis shortness of breath and possible community-acquired pneumonia.  He recalls significant sharp pleuritic pain.  He believes he had myocarditis.  Review of the records indicate CT scan describing  atelectasis Davene was ground glass] slight elevation in procalcitonin and WBC he was treated empirically with antibiotics.  He did have an echocardiogram his EF is unchanged.  After that he was discharged  He says he was doing well till April 2025 and at the pollen came he started getting symptoms of shortness of breath with exertion, cough, yellow and green sputum.  This is interfering with his sleep.  Therefore he is here today.  His exam nitric oxide  is significantly elevated at greater than 60 ppb consistent with type II airway eosinophilic asthma.  His medication list already shows Advair and Singulair  [do not know his compliance of this]  Review of the labs indicate - Earlier this month he had a significantly elevated proBNP greater than 4000 - Yesterday creatinine is up at 1.3 [he is on Lasix  and other medications    CT Chest data from date: 12/05/2023  - personally visualized and independently interpreted : yes - my findings are: disagreew ith below. He has bilateral LL GGO  IMPRESSION: No evidence of pulmonary embolus.   Bibasilar atelectasis or scarring.   No chest wall mass or evidence of rib fracture.     Electronically Signed   By: Franky Crease M.D.   On: 12/05/2023 01:0  PFT      No data to display             LAB RESULTS last 96 hours MYOCARDIAL PERFUSION IMAGING Result Date: 07/31/2024   Findings are consistent with no  ischemia. The study is high risk.   No ST deviation was noted. The ECG was not diagnostic due to pharmacologic protocol.   LV perfusion is abnormal. Defect 1: There is a small defect with mild reduction in uptake present in the mid to basal inferior and inferoseptal location(s) that is partially reversible. There is abnormal wall motion in the defect area. Consistent with artifact caused by ventricular pacing.   Left ventricular function is abnormal. Global function is severely reduced. Nuclear stress EF: 29%. The left ventricular ejection  fraction is severely decreased (<30%). End diastolic cavity size is moderately enlarged. End systolic cavity size is moderately enlarged.   CT images were obtained for attenuation correction and were examined for the presence of coronary calcium  when appropriate.   Coronary calcium  was present on the attenuation correction CT images. Moderate coronary calcifications were present. Coronary calcifications were present in the left anterior descending artery and left circumflex artery distribution(s). Pacing wires are incidentally noted.   Prior study available for comparison from 09/14/2017.   ECG rhythm shows AV pacing. Small size, mild intensity fixed basal to mid inferior and inferoseptal defect, likely related to RV pacing. No significant reversible ischemia. LVEF 29% with global hypokinesis. This is a high risk study. Compared to a prior study in 2018, the LVEF is lower. Echo correlation is advised.         has a past medical history of Arthritis, Asthma, CHF (congestive heart failure) (HCC), Chronic bronchitis (HCC), Environmental allergies, GERD (gastroesophageal reflux disease), Headache, History of blood transfusion, HTN (hypertension), Hyperlipidemia, LBBB (left bundle branch block) (1999), Metatarsal bone fracture (03/07/2018), Myocardial infarction (HCC), Pneumonia, Presence of permanent cardiac pacemaker (07/25/2018), Seasonal allergies, Sleep apnea, and Type II diabetes mellitus (HCC).   reports that he quit smoking about 60 years ago. His smoking use included cigarettes. He started smoking about 62 years ago. He has a 4 pack-year smoking history. He has never used smokeless tobacco.  Past Surgical History:  Procedure Laterality Date   BACK SURGERY     BIV PACEMAKER INSERTION CRT-P  07/25/2018   BIV PACEMAKER INSERTION CRT-P N/A 07/25/2018   Procedure: BIV PACEMAKER INSERTION CRT-P;  Surgeon: Waddell Danelle ORN, MD;  Location: [facility] INVASIVE CV LAB;  Service: Cardiovascular;  Laterality: N/A;    CARDIAC CATHETERIZATION  2003   Dr Esmeralda Sharps; 85 % R circumflex obstruction   CARPAL TUNNEL RELEASE Left 10/11/2014   Procedure: LEFT CARPAL TUNNEL RELEASE;  Surgeon: Elsie Mussel, MD;  Location: [facility];  Service: Orthopedics;  Laterality: Left;   COLONOSCOPY W/ BIOPSIES AND POLYPECTOMY  2018   INGUINAL HERNIA REPAIR Right 1948   INGUINAL HERNIA REPAIR Left 1988   LUMBAR LAMINECTOMY  1984   MINOR CARPAL TUNNEL Right 11/22/2014   Procedure: RIGHT LIMITED OPEN CARPAL TUNNEL RELEASE;  Surgeon: Elsie Mussel, MD;  Location: [facility];  Service: Orthopedics;  Laterality: Right;   TONSILLECTOMY  1958    Allergies  Allergen Reactions   Contrast Media [Iodinated Contrast Media] Other (See Comments)    Redness and warm sensation, this reaction was noted on 02/27/13 during a Cardiac MRI per pt.  Pt sts he had erythema on his head, chest, and shoulders.  Pt had a pacemaker placed August 2019 and was pre-medicated for the dye and had no allergic reaction at that time. -Carissa Mozingo B.S. RT(R)(CT)    Immunization History  Administered Date(s) Administered   Fluad Quad(high Dose 65+) 08/01/2019, 08/08/2021, 11/20/2022   Influenza Split  08/30/2012   Influenza, High Dose Seasonal PF 08/31/2017, 08/08/2020, 09/04/2021, 08/18/2023   Influenza,inj,Quad PF,6+ Mos 10/09/2013, 09/12/2015   Influenza,trivalent, recombinat, inj, PF 08/21/2018   Influenza-Unspecified 09/29/2014, 09/02/2017, 08/30/2019, 08/08/2020   Moderna Sars-Covid-2 Vaccination 09/12/2018, 01/25/2020, 03/03/2020   PFIZER Comirnaty(Gray Top)Covid-19 Tri-Sucrose Vaccine 05/26/2021   Pfizer Covid-19 Vaccine Bivalent Booster 84yrs & up 09/07/2021   Pfizer(Comirnaty)Fall Seasonal Vaccine 12 years and older 08/18/2023   Pneumococcal Conjugate-13 03/04/2016   Pneumococcal Polysaccharide-23 04/12/2017   Respiratory Syncytial Virus Vaccine,Recomb Aduvanted(Arexvy) 01/18/2023   Td 05/13/2008   Tdap  07/02/2020    Family History  Problem Relation Age of Onset   Heart attack Father        56s   Colon polyps Father    Heart failure Mother        90s   Subarachnoid hemorrhage Mother 11   Hypertension Mother    Subarachnoid hemorrhage Paternal Grandfather 72   Diabetes Maternal Aunt    Colon cancer Neg Hx      Current Outpatient Medications:    ACCU-CHEK AVIVA PLUS test strip, USE AS INSTRUCTED TO TEST BLOOD SUGAR 6 TIMES DAILY. DX CODE: E11.59, Disp: 500 strip, Rfl: 4   ACCU-CHEK SOFTCLIX LANCETS lancets, Use to test blood sugar daily as directed., Disp: 100 each, Rfl: 3   albuterol  (PROVENTIL ) (2.5 MG/3ML) 0.083% nebulizer solution, Take 3 mLs (2.5 mg total) by nebulization every 6 (six) hours as needed for wheezing or shortness of breath., Disp: 150 mL, Rfl: 5   albuterol  (VENTOLIN  HFA) 108 (90 Base) MCG/ACT inhaler, TAKE 2 PUFFS BY MOUTH EVERY 6 HOURS AS NEEDED FOR WHEEZE OR SHORTNESS OF BREATH, Disp: 6.7 each, Rfl: 11   aspirin  EC 81 MG tablet, Take 1 tablet (81 mg total) by mouth at bedtime. Swallow whole., Disp: 30 tablet, Rfl: 12   Continuous Blood Gluc Receiver (FREESTYLE LIBRE READER) DEVI, 1 Device by Does not apply route every 14 (fourteen) days. Freestyle libre 3 reader, Disp: 1 each, Rfl: 0   Continuous Glucose Sensor (FREESTYLE LIBRE 14 DAY SENSOR) MISC, APPLY 1 SENSOR EVERY 14 DAYS, Disp: 6 each, Rfl: 3   dapagliflozin  propanediol (FARXIGA ) 10 MG TABS tablet, Take 1 tablet (10 mg total) by mouth daily before breakfast., Disp: 90 tablet, Rfl: 3   fluticasone -salmeterol (ADVAIR HFA) 230-21 MCG/ACT inhaler, Inhale 2 puffs into the lungs 2 (two) times daily., Disp: 1 each, Rfl: 12   Fluticasone -Umeclidin-Vilant (TRELEGY ELLIPTA ) 200-62.5-25 MCG/ACT AEPB, Inhale 1 puff into the lungs daily., Disp: , Rfl:    furosemide  (LASIX ) 20 MG tablet, Take 40mg  (2 tablets)by mouth for 3 days then take 20mg  (1 tablet) by mouth daily., Disp: 100 tablet, Rfl: 3   HYDROcodone -acetaminophen   (NORCO) 7.5-325 MG tablet, Take 1 tablet by mouth every 6 (six) hours as needed for severe pain (pain score 7-10) (for chronic joint pain, neck pain)., Disp: 90 tablet, Rfl: 0   ipratropium (ATROVENT ) 0.02 % nebulizer solution, Take 2.5 mLs (0.5 mg total) by nebulization 4 (four) times daily., Disp: 125 mL, Rfl: 3   LORazepam  (ATIVAN ) 1 MG tablet, TAKE 1/2 TO 1 TABLET BY MOUTH NIGHTLY AS NEEDED., Disp: 30 tablet, Rfl: 5   metFORMIN  (GLUCOPHAGE ) 500 MG tablet, TAKE 1 TABLET BY MOUTH TWICE A DAY WITH FOOD, Disp: 180 tablet, Rfl: 1   montelukast  (SINGULAIR ) 10 MG tablet, Take 1 tablet (10 mg total) by mouth at bedtime., Disp: 100 tablet, Rfl: 3   nebivolol  (BYSTOLIC ) 5 MG tablet, Take 1 tablet (5 mg total) by  mouth daily., Disp: 90 tablet, Rfl: 3   nitroGLYCERIN  (NITROSTAT ) 0.4 MG SL tablet, Place 1 tablet (0.4 mg total) under the tongue every 5 (five) minutes x 3 doses as needed for chest pain., Disp: 75 tablet, Rfl: 1   potassium chloride  20 MEQ TBCR, Take 0.5 tablets (10 mEq total) by mouth daily., Disp: 90 tablet, Rfl: 3   predniSONE  (DELTASONE ) 10 MG tablet, Take prednisone  40 mg daily x 2 days, then 20mg  daily x 2 days, then 10mg  daily x 2 days, then 5mg  daily x 2 days and stop, Disp: 15 tablet, Rfl: 0   Respiratory Therapy Supplies (FLUTTER) DEVI, Twice a day and prn as needed, may increase if feeling worse, Disp: 1 each, Rfl: 0   rosuvastatin  (CRESTOR ) 10 MG tablet, TAKE 1 TABLET ON MONDAY, WEDNESDAY & FRIDAY., Disp: 45 tablet, Rfl: 3   vardenafil  (LEVITRA ) 20 MG tablet, TAKE 1 TABLET BY MOUTH AS DIRECTED. CAN NOT BE TAKEN WITH NITROGLYCERIN . NOT COVERED BY INSURANCE, Disp: 6 tablet, Rfl: 2   glipiZIDE  (GLUCOTROL ) 5 MG tablet, TAKE 1-2 TABLETS (5-10 MG TOTAL) BY MOUTH 2 (TWO) TIMES DAILY BEFORE A MEAL., Disp: 180 tablet, Rfl: 1   predniSONE  (DELTASONE ) 20 MG tablet, TAKE 2 TABLETS BY MOUTH DAILY WITH BREAKFAST., Disp: 10 tablet, Rfl: 0      Objective:   Vitals:   08/03/24 0922  BP: 112/68   Pulse: 72  SpO2: 99%  Weight: 158 lb 9.6 oz (71.9 kg)  Height: 5' 9.5 (1.765 m)    Estimated body mass index is 23.09 kg/m as calculated from the following:   Height as of this encounter: 5' 9.5 (1.765 m).   Weight as of this encounter: 158 lb 9.6 oz (71.9 kg).  @WEIGHTCHANGE @  [employer]   08/03/24 0922  Weight: 158 lb 9.6 oz (71.9 kg)     Physical Exam   General: No distress. Thin. Looks lean but well O2 at rest: no Cane present: no Sitting in wheel chair: no Frail: no Obese: no Neuro: Alert and Oriented x 3. GCS 15. Speech normal Psych: Pleasant Resp:  Barrel Chest - no.  Wheeze - no, Crackles - no, No overt respiratory distress CVS: Normal heart sounds. Murmurs - no Ext: Stigmata of Connective Tissue Disease - no CHEST: SCOLISIS + HEENT: Normal upper airway. PEERL +. No post nasal drip        Assessment/     Assessment & Plan Moderate asthma without complication, unspecified whether persistent  History of asthma  Chronic cough  DOE (dyspnea on exertion)  Ground glass opacity present on imaging of lung  AKI (acute kidney injury) (HCC)  History of chronic CHF    PLAN Patient Instructions     ICD-10-CM   1. Moderate asthma without complication, unspecified whether persistent  J45.909     2. History of asthma  Z87.09     3. Chronic cough  R05.3     4. DOE (dyspnea on exertion)  R06.09     5. Ground glass opacity present on imaging of lung  R91.8     6. AKI (acute kidney injury) (HCC)  N17.9     7. History of chronic CHF  Z86.79      Moderate asthma without complication, unspecified whether persistent History of asthma Chronic cough DOE (dyspnea on exertion)  - feno 08/03/2024 63 is c/w asthma and symptoms likely related to asthma  Plan  -check cbc with diff, RAST allergy panel 08/03/2024 - full PFT - continue  singulair   - change advair to trelegy 200 (Take 2 samples)  - Might have to consider Biologics based on course - do  prednisone  after blood work   - Take prednisone  40 mg daily x 2 days, then 20mg  daily x 2 days, then 10mg  daily x 2 days, then 5mg  daily x 2 days and stop   Ground glass opacity present on imaging of lung - dec 2024  - could have been heart related In dec 2024  Plan  - do HRCT in few months as followup  AKI (acute kidney injury) (HCC) History of chronic CHF  - your bNP was 4000 on 07/17/24 suggesting that CHF likely very active right now.  -Also,  creatinine yesterday was 1.34 and highest ever. Could be lasix  or medication related  Plan  = talk to cardiology or whoever did the blood work yesterday - check bnp 08/03/2024   Followup  - 8 weeks with APP    FOLLOWUP    Return in about 8 weeks (around 09/28/2024) for with any of the APPS afte PFT.    SIGNATURE    Dr. Dorethia Cave, M.D., F.C.C.P,  Pulmonary and Critical Care Medicine Staff Physician, [facility] Director - Interstitial Lung Disease  Program  Pulmonary Fibrosis [facility] at [facility] [place], [place], [address]  Pager: [phone], If no answer or between  15:00h - 7:00h: call [phone] Telephone: [phone]  10:09 AM 08/03/2024

## 2024-08-03 ENCOUNTER — Ambulatory Visit: Admitting: Internal Medicine

## 2024-08-03 ENCOUNTER — Encounter: Payer: Self-pay | Admitting: Internal Medicine

## 2024-08-03 VITALS — BP 112/68 | HR 72 | Ht 69.5 in | Wt 158.6 lb

## 2024-08-03 DIAGNOSIS — Z8709 Personal history of other diseases of the respiratory system: Secondary | ICD-10-CM

## 2024-08-03 DIAGNOSIS — Z8679 Personal history of other diseases of the circulatory system: Secondary | ICD-10-CM

## 2024-08-03 DIAGNOSIS — R918 Other nonspecific abnormal finding of lung field: Secondary | ICD-10-CM

## 2024-08-03 DIAGNOSIS — N179 Acute kidney failure, unspecified: Secondary | ICD-10-CM | POA: Diagnosis not present

## 2024-08-03 DIAGNOSIS — R053 Chronic cough: Secondary | ICD-10-CM | POA: Diagnosis not present

## 2024-08-03 DIAGNOSIS — J45909 Unspecified asthma, uncomplicated: Secondary | ICD-10-CM

## 2024-08-03 DIAGNOSIS — R0609 Other forms of dyspnea: Secondary | ICD-10-CM

## 2024-08-03 LAB — BASIC METABOLIC PANEL WITH GFR
BUN/Creatinine Ratio: 16 (ref 10–24)
BUN: 21 mg/dL (ref 8–27)
CO2: 23 mmol/L (ref 20–29)
Calcium: 9.9 mg/dL (ref 8.6–10.2)
Chloride: 99 mmol/L (ref 96–106)
Creatinine, Ser: 1.31 mg/dL — ABNORMAL HIGH (ref 0.76–1.27)
Glucose: 99 mg/dL (ref 70–99)
Potassium: 5.2 mmol/L (ref 3.5–5.2)
Sodium: 138 mmol/L (ref 134–144)
eGFR: 55 mL/min/1.73 — ABNORMAL LOW (ref >59)

## 2024-08-03 LAB — NITRIC OXIDE: Nitric Oxide: 61

## 2024-08-03 LAB — CBC WITH DIFFERENTIAL/PLATELET
Basophils Absolute: 0 K/uL (ref 0.0–0.1)
Basophils Relative: 0.5 % (ref 0.0–3.0)
Eosinophils Absolute: 0.2 K/uL (ref 0.0–0.7)
Eosinophils Relative: 2 % (ref 0.0–5.0)
HCT: 38.7 % — ABNORMAL LOW (ref 39.0–52.0)
Hemoglobin: 12.2 g/dL — ABNORMAL LOW (ref 13.0–17.0)
Lymphocytes Relative: 6.8 % — ABNORMAL LOW (ref 12.0–46.0)
Lymphs Abs: 0.6 K/uL — ABNORMAL LOW (ref 0.7–4.0)
MCHC: 31.6 g/dL (ref 30.0–36.0)
MCV: 83.3 fl (ref 78.0–100.0)
Monocytes Absolute: 0.7 K/uL (ref 0.1–1.0)
Monocytes Relative: 8.6 % (ref 3.0–12.0)
Neutro Abs: 6.7 K/uL (ref 1.4–7.7)
Neutrophils Relative %: 82.1 % — ABNORMAL HIGH (ref 43.0–77.0)
Platelets: 322 K/uL (ref 150.0–400.0)
RBC: 4.64 Mil/uL (ref 4.22–5.81)
RDW: 16.9 % — ABNORMAL HIGH (ref 11.5–15.5)
WBC: 8.2 K/uL (ref 4.0–10.5)

## 2024-08-03 LAB — BRAIN NATRIURETIC PEPTIDE: Pro B Natriuretic peptide (BNP): 1036 pg/mL — ABNORMAL HIGH (ref 0.0–100.0)

## 2024-08-03 MED ORDER — PREDNISONE 10 MG PO TABS: ORAL_TABLET | ORAL | 0 refills | Status: DC

## 2024-08-03 MED ORDER — TRELEGY ELLIPTA 200-62.5-25 MCG/ACT IN AEPB: 1.0000 | INHALATION_SPRAY | Freq: Every day | RESPIRATORY_TRACT | Status: DC

## 2024-08-03 NOTE — Patient Instructions (Addendum)
 ICD-10-CM   1. Moderate asthma without complication, unspecified whether persistent  J45.909     2. History of asthma  Z87.09     3. Chronic cough  R05.3     4. DOE (dyspnea on exertion)  R06.09     5. Ground glass opacity present on imaging of lung  R91.8     6. AKI (acute kidney injury) (HCC)  N17.9     7. History of chronic CHF  Z86.79      Moderate asthma without complication, unspecified whether persistent History of asthma Chronic cough DOE (dyspnea on exertion)  - feno 08/03/2024 63 is c/w asthma and symptoms likely related to asthma  Plan  -check cbc with diff, RAST allergy panel 08/03/2024 - full PFT - continue singulair   - change advair to trelegy 200 (Take 2 samples)  - Might have to consider Biologics based on course - do prednisone  after blood work   - Take prednisone  40 mg daily x 2 days, then 20mg  daily x 2 days, then 10mg  daily x 2 days, then 5mg  daily x 2 days and stop   Ground glass opacity present on imaging of lung - dec 2024  - could have been heart related In dec 2024  Plan  - do HRCT in few months as followup  AKI (acute kidney injury) (HCC) History of chronic CHF  - your bNP was 4000 on 07/17/24 suggesting that CHF likely very active right now.  -Also,  creatinine yesterday was 1.34 and highest ever. Could be lasix  or medication related  Plan  = talk to cardiology or whoever did the blood work yesterday - check bnp 08/03/2024   Followup  - 8 weeks with APP

## 2024-08-03 NOTE — Addendum Note (Signed)
 Addended by: LORING ANDRIETTE HERO on: 08/03/2024 12:07 PM   Modules accepted: Orders

## 2024-08-04 ENCOUNTER — Ambulatory Visit: Payer: Self-pay | Admitting: Cardiovascular Disease

## 2024-08-04 ENCOUNTER — Other Ambulatory Visit: Payer: Self-pay | Admitting: Internal Medicine

## 2024-08-05 ENCOUNTER — Ambulatory Visit: Payer: Self-pay | Admitting: Internal Medicine

## 2024-08-06 ENCOUNTER — Other Ambulatory Visit: Payer: Self-pay

## 2024-08-06 ENCOUNTER — Encounter: Payer: Self-pay | Admitting: Internal Medicine

## 2024-08-06 LAB — ALLERGEN PROFILE, PERENNIAL ALLERGEN IGE
Alternaria Alternata IgE: <0.1 kU/L
Aspergillus Fumigatus IgE: <0.1 kU/L
Aureobasidi Pullulans IgE: <0.1 kU/L
Candida Albicans IgE: <0.1 kU/L
Cat Dander IgE: <0.1 kU/L
Chicken Feathers IgE: <0.1 kU/L
Cladosporium Herbarum IgE: <0.1 kU/L
Cow Dander IgE: <0.1 kU/L
D Farinae IgE: 1.87 kU/L — AB
D Pteronyssinus IgE: 1.35 kU/L — AB
Dog Dander IgE: <0.1 kU/L
Duck Feathers IgE: <0.1 kU/L
Goose Feathers IgE: <0.1 kU/L
Mouse Urine IgE: <0.1 kU/L
Mucor Racemosus IgE: <0.1 kU/L
Penicillium Chrysogen IgE: <0.1 kU/L
Phoma Betae IgE: <0.1 kU/L
Setomelanomma Rostrat: <0.1 kU/L
Stemphylium Herbarum IgE: <0.1 kU/L

## 2024-08-06 NOTE — Patient Instructions (Addendum)
   Have blood work done the week of Sept 8th    Medications changes include :   Do lasix  40 mg daily x 3 days then 20 mg daily with lasix  40 mg 3 times a week     Return in about 6 months (around 02/07/2025) for Physical Exam.

## 2024-08-06 NOTE — Progress Notes (Unsigned)
 Subjective:    Patient ID: Jeffery [name], male    DOB: [birth year], 83 y.o.   MRN: [id number]     HPI Jeffery [name] is here for follow up of his chronic medical problems.  Saw pulm - advair changed to trelegy.  Started on prednisone  taper.     Medications and allergies reviewed with patient and updated if appropriate.  Current Outpatient Medications on File Prior to Visit  Medication Sig Dispense Refill   ACCU-CHEK AVIVA PLUS test strip USE AS INSTRUCTED TO TEST BLOOD SUGAR 6 TIMES DAILY. DX CODE: E11.59 500 strip 4   ACCU-CHEK SOFTCLIX LANCETS lancets Use to test blood sugar daily as directed. 100 each 3   albuterol  (PROVENTIL ) (2.5 MG/3ML) 0.083% nebulizer solution Take 3 mLs (2.5 mg total) by nebulization every 6 (six) hours as needed for wheezing or shortness of breath. 150 mL 5   albuterol  (VENTOLIN  HFA) 108 (90 Base) MCG/ACT inhaler TAKE 2 PUFFS BY MOUTH EVERY 6 HOURS AS NEEDED FOR WHEEZE OR SHORTNESS OF BREATH 6.7 each 11   aspirin  EC 81 MG tablet Take 1 tablet (81 mg total) by mouth at bedtime. Swallow whole. 30 tablet 12   Continuous Blood Gluc Receiver (FREESTYLE LIBRE READER) DEVI 1 Device by Does not apply route every 14 (fourteen) days. Freestyle libre 3 reader 1 each 0   Continuous Glucose Sensor (FREESTYLE LIBRE 14 DAY SENSOR) MISC APPLY 1 SENSOR EVERY 14 DAYS 6 each 3   dapagliflozin  propanediol (FARXIGA ) 10 MG TABS tablet Take 1 tablet (10 mg total) by mouth daily before breakfast. 90 tablet 3   fluticasone -salmeterol (ADVAIR HFA) 230-21 MCG/ACT inhaler Inhale 2 puffs into the lungs 2 (two) times daily. 1 each 12   Fluticasone -Umeclidin-Vilant (TRELEGY ELLIPTA ) 200-62.5-25 MCG/ACT AEPB Inhale 1 puff into the lungs daily.     furosemide  (LASIX ) 20 MG tablet Take 40mg  (2 tablets)by mouth for 3 days then take 20mg  (1 tablet) by mouth daily. 100 tablet 3   glipiZIDE  (GLUCOTROL ) 5 MG tablet TAKE 1-2 TABLETS (5-10 MG TOTAL) BY MOUTH 2 (TWO) TIMES DAILY BEFORE A MEAL. 180  tablet 1   HYDROcodone -acetaminophen  (NORCO) 7.5-325 MG tablet Take 1 tablet by mouth every 6 (six) hours as needed for severe pain (pain score 7-10) (for chronic joint pain, neck pain). 90 tablet 0   ipratropium (ATROVENT ) 0.02 % nebulizer solution Take 2.5 mLs (0.5 mg total) by nebulization 4 (four) times daily. 125 mL 3   LORazepam  (ATIVAN ) 1 MG tablet TAKE 1/2 TO 1 TABLET BY MOUTH NIGHTLY AS NEEDED. 30 tablet 5   metFORMIN  (GLUCOPHAGE ) 500 MG tablet TAKE 1 TABLET BY MOUTH TWICE A DAY WITH FOOD 180 tablet 1   montelukast  (SINGULAIR ) 10 MG tablet Take 1 tablet (10 mg total) by mouth at bedtime. 100 tablet 3   nebivolol  (BYSTOLIC ) 5 MG tablet Take 1 tablet (5 mg total) by mouth daily. 90 tablet 3   nitroGLYCERIN  (NITROSTAT ) 0.4 MG SL tablet Place 1 tablet (0.4 mg total) under the tongue every 5 (five) minutes x 3 doses as needed for chest pain. 75 tablet 1   potassium chloride  20 MEQ TBCR Take 0.5 tablets (10 mEq total) by mouth daily. 90 tablet 3   predniSONE  (DELTASONE ) 10 MG tablet Take prednisone  40 mg daily x 2 days, then 20mg  daily x 2 days, then 10mg  daily x 2 days, then 5mg  daily x 2 days and stop 15 tablet 0   predniSONE  (DELTASONE ) 20 MG tablet TAKE 2 TABLETS  BY MOUTH DAILY WITH BREAKFAST. 10 tablet 0   Respiratory Therapy Supplies (FLUTTER) DEVI Twice a day and prn as needed, may increase if feeling worse 1 each 0   rosuvastatin  (CRESTOR ) 10 MG tablet TAKE 1 TABLET ON MONDAY, WEDNESDAY & FRIDAY. 45 tablet 3   vardenafil  (LEVITRA ) 20 MG tablet TAKE 1 TABLET BY MOUTH AS DIRECTED. CAN NOT BE TAKEN WITH NITROGLYCERIN . NOT COVERED BY INSURANCE 6 tablet 2   No current facility-administered medications on file prior to visit.     Review of Systems     Objective:  There were no vitals filed for this visit. BP Readings from Last 3 Encounters:  08/03/24 112/68  07/17/24 106/60  05/04/24 108/64   Wt Readings from Last 3 Encounters:  08/03/24 158 lb 9.6 oz (71.9 kg)  07/17/24 160 lb  3.2 oz (72.7 kg)  05/04/24 158 lb (71.7 kg)   There is no height or weight on file to calculate BMI.    Physical Exam     Lab Results  Component Value Date   WBC 8.2 08/03/2024   HGB 12.2 (L) 08/03/2024   HCT 38.7 (L) 08/03/2024   PLT 322.0 08/03/2024   GLUCOSE 99 08/02/2024   CHOL 116 02/20/2024   TRIG 97.0 02/20/2024   HDL 39.70 02/20/2024   LDLCALC 57 02/20/2024   ALT 24 05/04/2024   AST 18 05/04/2024   NA 138 08/02/2024   K 5.2 08/02/2024   CL 99 08/02/2024   CREATININE 1.31 (H) 08/02/2024   BUN 21 08/02/2024   CO2 23 08/02/2024   TSH 0.75 05/04/2024   PSA 1.09 05/04/2024   HGBA1C 6.7 (H) 02/20/2024   MICROALBUR 0.8 02/20/2024     Assessment & Plan:    See Problem List for Assessment and Plan of chronic medical problems.

## 2024-08-07 ENCOUNTER — Ambulatory Visit: Admitting: Internal Medicine

## 2024-08-07 ENCOUNTER — Other Ambulatory Visit: Payer: Self-pay | Admitting: Internal Medicine

## 2024-08-07 ENCOUNTER — Ambulatory Visit: Payer: Self-pay | Admitting: Internal Medicine

## 2024-08-07 VITALS — BP 118/80 | HR 74 | Temp 97.6°F | Ht 69.5 in | Wt 159.0 lb

## 2024-08-07 DIAGNOSIS — E785 Hyperlipidemia, unspecified: Secondary | ICD-10-CM

## 2024-08-07 DIAGNOSIS — I152 Hypertension secondary to endocrine disorders: Secondary | ICD-10-CM | POA: Diagnosis not present

## 2024-08-07 DIAGNOSIS — E1169 Type 2 diabetes mellitus with other specified complication: Secondary | ICD-10-CM

## 2024-08-07 DIAGNOSIS — E1159 Type 2 diabetes mellitus with other circulatory complications: Secondary | ICD-10-CM | POA: Diagnosis not present

## 2024-08-07 DIAGNOSIS — I2510 Atherosclerotic heart disease of native coronary artery without angina pectoris: Secondary | ICD-10-CM

## 2024-08-07 DIAGNOSIS — E1142 Type 2 diabetes mellitus with diabetic polyneuropathy: Secondary | ICD-10-CM | POA: Diagnosis not present

## 2024-08-07 DIAGNOSIS — J45909 Unspecified asthma, uncomplicated: Secondary | ICD-10-CM | POA: Diagnosis not present

## 2024-08-07 DIAGNOSIS — I5042 Chronic combined systolic (congestive) and diastolic (congestive) heart failure: Secondary | ICD-10-CM | POA: Diagnosis not present

## 2024-08-07 DIAGNOSIS — Z7984 Long term (current) use of oral hypoglycemic drugs: Secondary | ICD-10-CM

## 2024-08-07 DIAGNOSIS — M150 Primary generalized (osteo)arthritis: Secondary | ICD-10-CM | POA: Diagnosis not present

## 2024-08-07 DIAGNOSIS — G8929 Other chronic pain: Secondary | ICD-10-CM | POA: Diagnosis not present

## 2024-08-07 DIAGNOSIS — G479 Sleep disorder, unspecified: Secondary | ICD-10-CM

## 2024-08-07 LAB — POCT GLYCOSYLATED HEMOGLOBIN (HGB A1C)
HbA1c POC (<> result, manual entry): 5.9 % (ref 4.0–5.6)
HbA1c, POC (controlled diabetic range): 5.9 % (ref 0.0–7.0)
HbA1c, POC (prediabetic range): 5.9 % — AB (ref 5.7–6.4)
Hemoglobin A1C: 5.9 % — AB (ref 4.0–5.6)

## 2024-08-07 MED ORDER — FLUTICASONE-SALMETEROL 230-21 MCG/ACT IN AERO: 2.0000 | INHALATION_SPRAY | Freq: Two times a day (BID) | RESPIRATORY_TRACT | Status: AC

## 2024-08-07 NOTE — Assessment & Plan Note (Addendum)
 Chronic generalized arthritis Pain controlled with Norco 7.5 mg - 325 mg-1 pill every 6 hours as needed-taking less often [place]  controlled substance database checked-taking medication appropriately Pain contract signed

## 2024-08-07 NOTE — Assessment & Plan Note (Addendum)
 Chronic Combined systolic and diastolic heart failure-EF 40-45%, grade 2 DD Echocardiogram scheduled for this fall Following with cardiology A lot of his lung symptoms are likely related to heart failure and not asthma No leg edema, but lungs sound congested Continue nebivolol  5 mg daily, Farxiga  10 mg daily Increase Lasix  to 40 mg daily x 3 days and then resume 20 mg, but take 40 mg 3 times a week BMP, BNP in a couple of weeks to reevaluate fluid balance

## 2024-08-07 NOTE — Assessment & Plan Note (Signed)
 Chronic Following with cardiology-Dr. Court Continue aspirin  81 mg daily, Crestor  20 mg 3 days a week

## 2024-08-07 NOTE — Assessment & Plan Note (Signed)
Chronic ?Controlled, Stable ?Continue Ativan 0.5-1 mg nightly as needed ?

## 2024-08-07 NOTE — Assessment & Plan Note (Signed)
 Chronic  Lab Results  Component Value Date   HGBA1C 5.9 (A) 08/07/2024   HGBA1C 5.9 08/07/2024   HGBA1C 5.9 (A) 08/07/2024   HGBA1C 5.9 08/07/2024   Sugars controlled Continue Farxiga  10 mg daily, metformin  500 mg twice daily If your sugars go too high will use glyburide  for short period of time as needed since on his steroids

## 2024-08-07 NOTE — Progress Notes (Signed)
 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 More importantly BNP is still > 1000 - so please talk to cardiology to optmie this more and you will feel better. Thanks

## 2024-08-07 NOTE — Assessment & Plan Note (Signed)
 Chronic Controlled No longer taking gabapentin

## 2024-08-07 NOTE — Assessment & Plan Note (Addendum)
 Chronic Recently saw pulmonary Advair changed to Trelegy, which is not covered by insurance but he is using the samples and then will return to using Advair twice daily.  Also placed on prednisone  taper, which has not helped much Continue singular 10 mg nightly, Allegra as needed

## 2024-08-07 NOTE — Assessment & Plan Note (Signed)
 Chronic Generalized Stable  Pain management with Norco as needed Pain controlled.  Taking medication appropriately. Continue Norco 7.5-325 mg 1 tab every 6 hours as needed [place] controlled substance database checked.

## 2024-08-07 NOTE — Assessment & Plan Note (Signed)
Chronic Blood pressure well controlled Continue Bystolic 5 mg daily

## 2024-08-07 NOTE — Assessment & Plan Note (Signed)
Chronic Regular exercise and healthy diet encouraged Check lipid panel  Continue Crestor 10 mg 3 times weekly 

## 2024-08-09 ENCOUNTER — Ambulatory Visit

## 2024-08-09 MED ORDER — HYDROCODONE-ACETAMINOPHEN 7.5-325 MG PO TABS
1.0000 | ORAL_TABLET | Freq: Four times a day (QID) | ORAL | 0 refills | Status: DC | PRN
  Filled 2024-08-09: qty 90, 23d supply, fill #0

## 2024-08-10 ENCOUNTER — Other Ambulatory Visit: Payer: Self-pay

## 2024-08-20 ENCOUNTER — Other Ambulatory Visit (INDEPENDENT_AMBULATORY_CARE_PROVIDER_SITE_OTHER)

## 2024-08-20 ENCOUNTER — Other Ambulatory Visit: Payer: Self-pay

## 2024-08-20 DIAGNOSIS — I5042 Chronic combined systolic (congestive) and diastolic (congestive) heart failure: Secondary | ICD-10-CM | POA: Diagnosis not present

## 2024-08-20 LAB — BASIC METABOLIC PANEL WITH GFR
BUN: 24 mg/dL — ABNORMAL HIGH (ref 6–23)
CO2: 31 meq/L (ref 19–32)
Calcium: 9.4 mg/dL (ref 8.4–10.5)
Chloride: 100 meq/L (ref 96–112)
Creatinine, Ser: 1.11 mg/dL (ref 0.40–1.50)
GFR: 62.49 mL/min (ref >60.00)
Glucose, Bld: 134 mg/dL — ABNORMAL HIGH (ref 70–99)
Potassium: 4.5 meq/L (ref 3.5–5.1)
Sodium: 137 meq/L (ref 135–145)

## 2024-08-20 LAB — BRAIN NATRIURETIC PEPTIDE: Pro B Natriuretic peptide (BNP): 895 pg/mL — ABNORMAL HIGH (ref 0.0–100.0)

## 2024-08-21 ENCOUNTER — Telehealth: Payer: Self-pay | Admitting: Radiology

## 2024-08-21 NOTE — Telephone Encounter (Signed)
 Copied from CRM #[id number]. Topic: General - Other >> Aug 21, 2024  2:22 PM Sasha M wrote: Reason for CRM: Pt called in today request covid vax script sent to [id number] [place], [place] - [address] [place] [address] Phone: [phone] Fax: [phone] Hours: Not open 24 hours

## 2024-08-22 ENCOUNTER — Other Ambulatory Visit: Payer: Self-pay

## 2024-08-22 MED ORDER — COVID-19 MRNA VAC-TRIS(PFIZER) 30 MCG/0.3ML IM SUSY: 0.3000 mL | PREFILLED_SYRINGE | Freq: Once | INTRAMUSCULAR | 0 refills | Status: AC

## 2024-08-22 NOTE — Telephone Encounter (Signed)
 Sent today

## 2024-08-23 ENCOUNTER — Ambulatory Visit: Payer: Self-pay | Admitting: Internal Medicine

## 2024-08-23 ENCOUNTER — Ambulatory Visit (HOSPITAL_COMMUNITY)
Admission: RE | Admit: 2024-08-23 | Discharge: 2024-08-23 | Disposition: A | Discharge status: Home / Self Care | Source: Ambulatory Visit | Attending: Cardiology | Admitting: Cardiology

## 2024-08-23 DIAGNOSIS — I10 Essential (primary) hypertension: Secondary | ICD-10-CM | POA: Insufficient documentation

## 2024-08-23 DIAGNOSIS — E782 Mixed hyperlipidemia: Secondary | ICD-10-CM | POA: Diagnosis not present

## 2024-08-23 DIAGNOSIS — I2510 Atherosclerotic heart disease of native coronary artery without angina pectoris: Secondary | ICD-10-CM | POA: Insufficient documentation

## 2024-08-23 DIAGNOSIS — I5042 Chronic combined systolic (congestive) and diastolic (congestive) heart failure: Secondary | ICD-10-CM

## 2024-08-23 DIAGNOSIS — I447 Left bundle-branch block, unspecified: Secondary | ICD-10-CM | POA: Insufficient documentation

## 2024-08-23 DIAGNOSIS — R0609 Other forms of dyspnea: Secondary | ICD-10-CM | POA: Diagnosis not present

## 2024-08-23 LAB — ECHOCARDIOGRAM COMPLETE
Area-P 1/2: 4.41 cm2
MV M vel: 4.1 m/s
MV Peak grad: 67.2 mmHg
MV VTI: 0.28 cm2
Radius: 0.5 cm
S' Lateral: 4.4 cm

## 2024-08-29 ENCOUNTER — Telehealth: Payer: Self-pay | Admitting: Cardiovascular Disease

## 2024-08-29 NOTE — Telephone Encounter (Signed)
Pt is returning nurse call regarding results and is requesting a callback. Please advise

## 2024-08-29 NOTE — Telephone Encounter (Signed)
 Left message to call back.

## 2024-08-29 NOTE — Telephone Encounter (Signed)
  Adrien LELON Conquest, RN 08/27/2024  1:49 PM EDT     Left message for pt to call    Dorn JINNY Lesches, MD 08/24/2024  6:27 AM EDT     Needs ROV to discuss decline in LV FXN   Dorn JINNY Lesches, MD 08/04/2024  1:12 PM EDT     Non  ischemic MV (artifact). EF severely reduced (29%). Await 2D results

## 2024-08-30 NOTE — Telephone Encounter (Signed)
 Echo results discussed with patient and patient states he wants to be seen by Dr.Berry only to discuss treatment options.   I will message schedulers.

## 2024-08-30 NOTE — Telephone Encounter (Signed)
 Pt returning nurses call.

## 2024-08-31 ENCOUNTER — Other Ambulatory Visit: Payer: Self-pay | Admitting: Internal Medicine

## 2024-09-05 ENCOUNTER — Encounter: Payer: Self-pay | Admitting: Cardiovascular Disease

## 2024-09-05 ENCOUNTER — Ambulatory Visit: Attending: Cardiology | Admitting: Cardiovascular Disease

## 2024-09-05 VITALS — BP 124/76 | HR 84 | Ht 69.5 in | Wt 152.6 lb

## 2024-09-05 DIAGNOSIS — I5021 Acute systolic (congestive) heart failure: Secondary | ICD-10-CM | POA: Diagnosis not present

## 2024-09-05 DIAGNOSIS — R0609 Other forms of dyspnea: Secondary | ICD-10-CM | POA: Diagnosis not present

## 2024-09-05 NOTE — Patient Instructions (Signed)
 Medication Instructions:  Your physician recommends that you continue on your current medications as directed. Please refer to the Current Medication list given to you today.  *If you need a refill on your cardiac medications before your next appointment, please call your pharmacy*   Follow-Up: At [facility], you and your health needs are our priority.  As part of our continuing mission to provide you with exceptional heart care, our providers are all part of one team.  This team includes your primary Cardiologist (physician) and Advanced Practice Providers or APPs (Physician Assistants and Nurse Practitioners) who all work together to provide you with the care you need, when you need it.  Your next appointment:   6 month(s)  Provider:   Dorn Lesches, MD    We recommend signing up for the patient portal called MyChart.  Sign up information is provided on this After Visit Summary.  MyChart is used to connect with patients for Virtual Visits (Telemedicine).  Patients are able to view lab/test results, encounter notes, upcoming appointments, etc.  Non-urgent messages can be sent to your provider as well.   To learn more about what you can do with MyChart, go to [email or web address].   Other Instructions We will call to set up your appointment to see Dr. Waddell.

## 2024-09-05 NOTE — Progress Notes (Signed)
 Remote PPM Transmission

## 2024-09-05 NOTE — Progress Notes (Signed)
 Jeffery [name] returns today for follow-up of his noninvasive diagnostic test and the evaluation of progressive dyspnea.  His 2D echo reveals a decline in his EF to 25 to 30% compared to his echo in December that showed an EF of 40 to 45% for unclear reasons.  His Myoview  stress test showed fixed inferior inferoseptal defect most likely related to RV pacing without evidence of ischemia.  He currently is on Bystolic .  He had an BiV pacemaker placed by Dr. Waddell in the past.  I am referring him to the [facility] heart failure clinic for initiation and titration of GDMT.  Given his reduction in EF they may decide to do a right left heart cath.  I am also referring him back to Dr. Waddell to consider upgrade to an ICD.  I will see him back in 6 months for follow-up.  Dorn DOROTHA Lesches, M.D., FACP, [facility], FAHA, [facility]  [address] [place], [place]  [address]  [phone] 09/05/2024 11:57 AM

## 2024-09-08 ENCOUNTER — Other Ambulatory Visit: Payer: Self-pay | Admitting: Internal Medicine

## 2024-09-09 MED ORDER — HYDROCODONE-ACETAMINOPHEN 7.5-325 MG PO TABS
1.0000 | ORAL_TABLET | Freq: Four times a day (QID) | ORAL | 0 refills | Status: DC | PRN
  Filled 2024-09-09 – 2024-09-25 (×3): qty 90, 23d supply, fill #0

## 2024-09-10 ENCOUNTER — Ambulatory Visit: Payer: Self-pay | Admitting: Internal Medicine

## 2024-09-10 ENCOUNTER — Other Ambulatory Visit: Payer: Self-pay

## 2024-09-10 ENCOUNTER — Other Ambulatory Visit

## 2024-09-10 DIAGNOSIS — I5042 Chronic combined systolic (congestive) and diastolic (congestive) heart failure: Secondary | ICD-10-CM

## 2024-09-10 LAB — BRAIN NATRIURETIC PEPTIDE: Pro B Natriuretic peptide (BNP): 488 pg/mL — ABNORMAL HIGH (ref 0.0–100.0)

## 2024-09-10 LAB — BASIC METABOLIC PANEL WITH GFR
BUN: 22 mg/dL (ref 6–23)
CO2: 32 meq/L (ref 19–32)
Calcium: 9.7 mg/dL (ref 8.4–10.5)
Chloride: 99 meq/L (ref 96–112)
Creatinine, Ser: 1.07 mg/dL (ref 0.40–1.50)
GFR: 65.28 mL/min (ref >60.00)
Glucose, Bld: 140 mg/dL — ABNORMAL HIGH (ref 70–99)
Potassium: 3.9 meq/L (ref 3.5–5.1)
Sodium: 138 meq/L (ref 135–145)

## 2024-09-11 ENCOUNTER — Telehealth (HOSPITAL_COMMUNITY): Payer: Self-pay | Admitting: Cardiology

## 2024-09-11 ENCOUNTER — Other Ambulatory Visit: Payer: Self-pay

## 2024-09-11 NOTE — H&P (View-Only) (Signed)
   ADVANCED HEART FAILURE CLINIC NOTE  Referring Physician: Geofm Glade PARAS, MD  Primary Care: Jeffery Glade PARAS, MD Primary Cardiologist: Heart Failure: Jeffery Commander, DO  CC: ***  HPI: Jeffery [name] is a 83 y.o. male with *** who presents today for ***.   Pertinent Family & social hx:   Cardiac History:     Interval hx:        PHYSICAL EXAM: There were no vitals filed for this visit. Lungs- *** CARDIAC:  JVP: *** cm          Normal rate with regular rhythm. *** murmur.  Pulses ***. *** edema.  ABDOMEN: soft EXTREMITIES: Warm and well perfused.   DATA REVIEW  ECG: ***  As per my personal interpretation  ECHO: ***  CATH: ***    ASSESSMENT & PLAN:  Heart Failure with *** fraction - Etiology & History: *** - NYHA Class: *** - Volume status: *** - GDMT:  -  RAASi: *** Beta-blocker: *** -  Hydralazine Riesa: *** -  SGLT2i: *** - ICD: *** - Advanced therapies: ***  I spent *** minutes in the care of Jeffery [name] today including {CHL AMB CAR Time Based Billing Options STW (Optional):[phone]::documenting in the encounter.}   Jeffery [name] Advanced Heart Failure Mechanical Circulatory Support

## 2024-09-11 NOTE — Progress Notes (Signed)
   ADVANCED HEART FAILURE CLINIC NOTE  Referring Physician: Geofm Glade PARAS, MD  Primary Care: Geofm Glade PARAS, MD Primary Cardiologist: Heart Failure: Ria Commander, DO  CC: ***  HPI: Jeffery [name] is a 83 y.o. male with *** who presents today for ***.   Pertinent Family & social hx:   Cardiac History:     Interval hx:        PHYSICAL EXAM: There were no vitals filed for this visit. Lungs- *** CARDIAC:  JVP: *** cm          Normal rate with regular rhythm. *** murmur.  Pulses ***. *** edema.  ABDOMEN: soft EXTREMITIES: Warm and well perfused.   DATA REVIEW  ECG: ***  As per my personal interpretation  ECHO: ***  CATH: ***    ASSESSMENT & PLAN:  Heart Failure with *** fraction - Etiology & History: *** - NYHA Class: *** - Volume status: *** - GDMT:  -  RAASi: *** Beta-blocker: *** -  Hydralazine Riesa: *** -  SGLT2i: *** - ICD: *** - Advanced therapies: ***  I spent *** minutes in the care of Jeffery [name] today including {CHL AMB CAR Time Based Billing Options STW (Optional):[phone]::documenting in the encounter.}   Aditya Sabharwal Advanced Heart Failure Mechanical Circulatory Support

## 2024-09-11 NOTE — Telephone Encounter (Signed)
 Called to confirm/remind patient of their appointment at the Advanced Heart Failure Clinic on 09/11/2024.   Appointment:   [] Confirmed  [x] Left mess   [] No answer/No voice mail  [] VM Full/unable to leave message  [] Phone not in service  Patient reminded to bring all medications and/or complete list.  Confirmed patient has transportation. Gave directions, instructed to utilize valet parking.

## 2024-09-12 ENCOUNTER — Other Ambulatory Visit (HOSPITAL_COMMUNITY): Payer: Self-pay | Admitting: *Deleted

## 2024-09-12 ENCOUNTER — Telehealth (HOSPITAL_COMMUNITY): Payer: Self-pay | Admitting: *Deleted

## 2024-09-12 ENCOUNTER — Ambulatory Visit (HOSPITAL_COMMUNITY)
Admission: RE | Admit: 2024-09-12 | Discharge: 2024-09-12 | Disposition: A | Discharge status: Home / Self Care | Source: Ambulatory Visit | Attending: Adult Health | Admitting: Adult Health

## 2024-09-12 ENCOUNTER — Encounter (HOSPITAL_COMMUNITY): Payer: Self-pay | Admitting: Cardiology

## 2024-09-12 VITALS — BP 104/60 | HR 71 | Ht 69.5 in | Wt 155.0 lb

## 2024-09-12 DIAGNOSIS — I429 Cardiomyopathy, unspecified: Secondary | ICD-10-CM | POA: Diagnosis not present

## 2024-09-12 DIAGNOSIS — Z950 Presence of cardiac pacemaker: Secondary | ICD-10-CM | POA: Diagnosis not present

## 2024-09-12 DIAGNOSIS — J45909 Unspecified asthma, uncomplicated: Secondary | ICD-10-CM | POA: Diagnosis not present

## 2024-09-12 DIAGNOSIS — E119 Type 2 diabetes mellitus without complications: Secondary | ICD-10-CM | POA: Insufficient documentation

## 2024-09-12 DIAGNOSIS — Z79899 Other long term (current) drug therapy: Secondary | ICD-10-CM | POA: Diagnosis not present

## 2024-09-12 DIAGNOSIS — I2510 Atherosclerotic heart disease of native coronary artery without angina pectoris: Secondary | ICD-10-CM | POA: Diagnosis not present

## 2024-09-12 DIAGNOSIS — Z833 Family history of diabetes mellitus: Secondary | ICD-10-CM | POA: Diagnosis not present

## 2024-09-12 DIAGNOSIS — I5042 Chronic combined systolic (congestive) and diastolic (congestive) heart failure: Secondary | ICD-10-CM

## 2024-09-12 DIAGNOSIS — Z7984 Long term (current) use of oral hypoglycemic drugs: Secondary | ICD-10-CM | POA: Diagnosis not present

## 2024-09-12 DIAGNOSIS — Z8249 Family history of ischemic heart disease and other diseases of the circulatory system: Secondary | ICD-10-CM | POA: Diagnosis not present

## 2024-09-12 DIAGNOSIS — I447 Left bundle-branch block, unspecified: Secondary | ICD-10-CM | POA: Diagnosis not present

## 2024-09-12 DIAGNOSIS — I5022 Chronic systolic (congestive) heart failure: Secondary | ICD-10-CM | POA: Diagnosis not present

## 2024-09-12 DIAGNOSIS — I441 Atrioventricular block, second degree: Secondary | ICD-10-CM | POA: Diagnosis not present

## 2024-09-12 MED ORDER — BISOPROLOL FUMARATE 5 MG PO TABS: 2.5000 mg | ORAL_TABLET | Freq: Every day | ORAL | 3 refills | Status: AC

## 2024-09-12 MED ORDER — SPIRONOLACTONE 25 MG PO TABS: 12.5000 mg | ORAL_TABLET | Freq: Every day | ORAL | 3 refills | Status: DC

## 2024-09-12 NOTE — Patient Instructions (Signed)
 Medication Changes:  STOP Nebivolol   START Bisoprolol 2.5 mg (1/2 tab) Daily  START Spironolactone 12.5 mg (1/2 tab) Daily  Lab Work:  Your physician recommends that you return for lab work in: 1-2 weeks  Testing/Procedures:  Heart Catheterization, see instructions below, **PLEASE CALL OUR OFFICE BACK TODAY WITH THE DATE YOU PREFER THE WEEK OF 10/27  Special Instructions // Education:  Do the following things EVERYDAY: Weigh yourself in the morning before breakfast. Write it down and keep it in a log. Take your medicines as prescribed Eat low salt foods--Limit salt (sodium) to 2000 mg per day.  Stay as active as you can everyday Limit all fluids for the day to less than 2 liters   CATHETERIZATION INSTRUCTIONS:  You are scheduled for a Cardiac Catheterization on _________  with Dr. Ezra Shuck.  1. Please arrive at the [facility] (Main Entrance A) at [facility]: [address] [place], [place] [address] at          (This time is 2 hour(s) before your procedure to ensure your preparation).   Free valet parking service is available. You will check in at ADMITTING. The support person will be asked to wait in the waiting room.  It is OK to have someone drop you off and come back when you are ready to be discharged.    Special note: Every effort is made to have your procedure done on time. Please understand that emergencies sometimes delay scheduled procedures.  2. Diet: Nothing to eat after midnight.   3. Hydration: On  MORNING OF YOUR TEST, you may drink approved liquids (see below) until 2 hours before the procedure with 8 oz of water as your last intake.   List of approved liquids water, clear juice, clear tea, black coffee, fruit juices, non-citric and without pulp, carbonated beverages, Gatorade, Kool -Aid, plain Jello-O and plain ice popsicles.  4. Labs: IN 1-2 WEEKS AS SCHEDULED  5. Medication instructions in preparation for your procedure:   MORNING OF  YOUR TEST DO NOT TAKE: Metformin , Glipizide , Furosemide , Farxiga , or Sprionolactone   On the morning of your procedure, take your Aspirin  81 mg and any morning medicines NOT listed above.  You may use sips of water.  6. Plan to go home the same day, you will only stay overnight if medically necessary. 7. Bring a current list of your medications and current insurance cards. 8. You MUST have a responsible person to drive you home. 9. Someone MUST be with you the first 24 hours after you arrive home or your discharge will be delayed. 10. Please wear clothes that are easy to get on and off and wear slip-on shoes.  Thank you for allowing us  to care for you!   -- [facility]   Follow-Up in: 1-2 months   At the Advanced Heart Failure Clinic, you and your health needs are our priority. We have a designated team specialized in the treatment of Heart Failure. This Care Team includes your primary Heart Failure Specialized Cardiologist (physician), Advanced Practice Providers (APPs- Physician Assistants and Nurse Practitioners), and Pharmacist who all work together to provide you with the care you need, when you need it.   You may see any of the following providers on your designated Care Team at your next follow up:  Dr. Toribio Fuel Dr. Ezra Shuck Dr. Ria Commander Dr. Odis Brownie Greig Mosses, NP Caffie Shed, [place] [facility] [place], [place] Beckey Coe, NP [place] Lee,  NP Tinnie Redman, PharmD   Please be sure to bring in all your medications bottles to every appointment.   Need to Contact Us :  If you have any questions or concerns before your next appointment please send us  a message through [place] or call our office at [phone].    TO LEAVE A MESSAGE FOR THE NURSE SELECT OPTION 2, PLEASE LEAVE A MESSAGE INCLUDING: YOUR NAME DATE OF BIRTH CALL BACK NUMBER REASON FOR CALL**this is important as we prioritize the call backs  YOU  WILL RECEIVE A CALL BACK THE SAME DAY AS LONG AS YOU CALL BEFORE 4:00 PM

## 2024-09-12 NOTE — Telephone Encounter (Signed)
 Pt called back to sch date/time of R/L HC  Pt sch for 10/27 at 7:30 AM, pt aware to arrive at 5:30 and follow instructions provided on AVS today

## 2024-09-16 ENCOUNTER — Encounter: Payer: Self-pay | Admitting: Internal Medicine

## 2024-09-18 ENCOUNTER — Encounter: Payer: Self-pay | Admitting: Cardiovascular Disease

## 2024-09-18 ENCOUNTER — Telehealth: Payer: Self-pay

## 2024-09-18 NOTE — Telephone Encounter (Signed)
 Copied from CRM [id number]. Topic: Clinical - Prescription Issue >> Sep 18, 2024  1:42 PM Precious C wrote: Reason for CRM: Patient called stating that his cardiologist prescribed furosemide  (Lasix ) 20 mg, but when he saw his PCP, Dr. Glade Hope, she advised that he should be taking 40 mg instead after reviewing pt's labs. The patient is requesting that Dr. Hope send a new prescription for furosemide  40 mg to his pharmacy, as previously discussed verbally. He stated that he has reached out multiple times and is still unable to pick up the medication due to the incorrect dosage and early refill issue. Patient is requesting a callback as soon as possible to resolve the matter.  Callback number: [phone]

## 2024-09-19 ENCOUNTER — Other Ambulatory Visit: Payer: Self-pay

## 2024-09-19 MED ORDER — FUROSEMIDE 40 MG PO TABS: 40.0000 mg | ORAL_TABLET | Freq: Every day | ORAL | 3 refills | Status: DC

## 2024-09-20 ENCOUNTER — Other Ambulatory Visit: Payer: Self-pay

## 2024-09-20 MED ORDER — FUROSEMIDE 40 MG PO TABS: 40.0000 mg | ORAL_TABLET | Freq: Every day | ORAL | 3 refills | Status: DC

## 2024-09-24 ENCOUNTER — Ambulatory Visit (HOSPITAL_COMMUNITY)
Admission: RE | Admit: 2024-09-24 | Discharge: 2024-09-24 | Disposition: A | Discharge status: Home / Self Care | Source: Ambulatory Visit | Attending: Internal Medicine | Admitting: Internal Medicine

## 2024-09-24 ENCOUNTER — Encounter: Payer: Self-pay | Admitting: Cardiology

## 2024-09-24 ENCOUNTER — Ambulatory Visit (HOSPITAL_COMMUNITY): Payer: Self-pay | Admitting: Cardiology

## 2024-09-24 DIAGNOSIS — I5042 Chronic combined systolic (congestive) and diastolic (congestive) heart failure: Secondary | ICD-10-CM | POA: Diagnosis not present

## 2024-09-24 LAB — BASIC METABOLIC PANEL WITH GFR
Anion gap: 11 (ref 5–15)
BUN: 23 mg/dL (ref 8–23)
CO2: 27 mmol/L (ref 22–32)
Calcium: 9.4 mg/dL (ref 8.9–10.3)
Chloride: 98 mmol/L (ref 98–111)
Creatinine, Ser: 1.09 mg/dL (ref 0.61–1.24)
GFR, Estimated: >60 mL/min (ref >60)
Glucose, Bld: 104 mg/dL — ABNORMAL HIGH (ref 70–99)
Potassium: 4.4 mmol/L (ref 3.5–5.1)
Sodium: 136 mmol/L (ref 135–145)

## 2024-09-24 LAB — BRAIN NATRIURETIC PEPTIDE: B Natriuretic Peptide: 386.1 pg/mL — ABNORMAL HIGH (ref 0.0–100.0)

## 2024-09-24 LAB — CBC
HCT: 41.5 % (ref 39.0–52.0)
Hemoglobin: 12.9 g/dL — ABNORMAL LOW (ref 13.0–17.0)
MCH: 25.7 pg — ABNORMAL LOW (ref 26.0–34.0)
MCHC: 31.1 g/dL (ref 30.0–36.0)
MCV: 82.8 fL (ref 80.0–100.0)
Platelets: 361 K/uL (ref 150–400)
RBC: 5.01 MIL/uL (ref 4.22–5.81)
RDW: 15.4 % (ref 11.5–15.5)
WBC: 8.6 K/uL (ref 4.0–10.5)
nRBC: 0 % (ref 0.0–0.2)

## 2024-09-25 ENCOUNTER — Other Ambulatory Visit: Payer: Self-pay

## 2024-09-26 ENCOUNTER — Ambulatory Visit: Admitting: Primary Care

## 2024-09-26 ENCOUNTER — Encounter

## 2024-09-28 ENCOUNTER — Encounter: Payer: Self-pay | Admitting: Internal Medicine

## 2024-09-28 ENCOUNTER — Ambulatory Visit: Attending: Internal Medicine | Admitting: Internal Medicine

## 2024-09-28 VITALS — BP 103/58 | HR 69 | Resp 16 | Ht 69.0 in | Wt 153.6 lb

## 2024-09-28 DIAGNOSIS — I10 Essential (primary) hypertension: Secondary | ICD-10-CM

## 2024-09-28 DIAGNOSIS — I5042 Chronic combined systolic (congestive) and diastolic (congestive) heart failure: Secondary | ICD-10-CM | POA: Diagnosis not present

## 2024-09-28 DIAGNOSIS — Z950 Presence of cardiac pacemaker: Secondary | ICD-10-CM | POA: Diagnosis not present

## 2024-09-28 NOTE — Progress Notes (Signed)
 HPI [name] returns today for followup. He is a pleasant 83 yo man with mild LV dysfunction, Stokes Adams syncope who underwent biv PPM insertion 6 years ago. He had done well but then developed worsening CHF symptoms and has been followed by Dr. Rolan. He has not decreased his biv pacing and his EF is now 30%. He has class 2 symptoms on GDMT. No syncope.  Allergies  Allergen Reactions   Contrast Media [Iodinated Contrast Media] Other (See Comments)    Redness and warm sensation, this reaction was noted on 02/27/13 during a Cardiac MRI per pt.  Pt sts he had erythema on his head, chest, and shoulders.  Pt had a pacemaker placed August 2019 and was pre-medicated for the dye and had no allergic reaction at that time. -Carissa Mozingo B.S. RT(R)(CT)     Current Outpatient Medications  Medication Sig Dispense Refill   ACCU-CHEK AVIVA PLUS test strip USE AS INSTRUCTED TO TEST BLOOD SUGAR 6 TIMES DAILY. DX CODE: E11.59 500 strip 4   ACCU-CHEK SOFTCLIX LANCETS lancets Use to test blood sugar daily as directed. 100 each 3   albuterol  (PROVENTIL ) (2.5 MG/3ML) 0.083% nebulizer solution Take 3 mLs (2.5 mg total) by nebulization every 6 (six) hours as needed for wheezing or shortness of breath. 150 mL 5   albuterol  (VENTOLIN  HFA) 108 (90 Base) MCG/ACT inhaler TAKE 2 PUFFS BY MOUTH EVERY 6 HOURS AS NEEDED FOR WHEEZE OR SHORTNESS OF BREATH 6.7 each 11   aspirin  EC 81 MG tablet Take 1 tablet (81 mg total) by mouth at bedtime. Swallow whole. 30 tablet 12   bisoprolol (ZEBETA) 5 MG tablet Take 0.5 tablets (2.5 mg total) by mouth daily. 15 tablet 3   Continuous Blood Gluc Receiver (FREESTYLE LIBRE READER) DEVI 1 Device by Does not apply route every 14 (fourteen) days. Freestyle libre 3 reader 1 each 0   Continuous Glucose Sensor (FREESTYLE LIBRE 14 DAY SENSOR) MISC APPLY 1 SENSOR EVERY 14 DAYS 6 each 3   dapagliflozin  propanediol (FARXIGA ) 10 MG TABS tablet Take 1 tablet (10 mg total) by mouth  daily before breakfast. 90 tablet 3   fluticasone -salmeterol (ADVAIR HFA) 230-21 MCG/ACT inhaler Inhale 2 puffs into the lungs 2 (two) times daily.     furosemide  (LASIX ) 40 MG tablet Take 1 tablet (40 mg total) by mouth daily. 90 tablet 3   glipiZIDE  (GLUCOTROL ) 5 MG tablet TAKE 1-2 TABLETS (5-10 MG TOTAL) BY MOUTH 2 (TWO) TIMES DAILY BEFORE A MEAL. 180 tablet 1   HYDROcodone -acetaminophen  (NORCO) 7.5-325 MG tablet Take 1 tablet by mouth every 6 (six) hours as needed for severe pain (pain score 7-10) (for chronic joint pain, neck pain). 90 tablet 0   ipratropium (ATROVENT ) 0.02 % nebulizer solution TAKE 2.5 ML (0.5 MG TOTAL) BY NEBULIZATION 4 TIMES A DAY 125 mL 3   LORazepam  (ATIVAN ) 1 MG tablet TAKE 1/2 TO 1 TABLET BY MOUTH NIGHTLY AS NEEDED. 30 tablet 5   metFORMIN  (GLUCOPHAGE ) 500 MG tablet TAKE 1 TABLET BY MOUTH TWICE A DAY WITH FOOD 180 tablet 1   montelukast  (SINGULAIR ) 10 MG tablet Take 1 tablet (10 mg total) by mouth at bedtime. 100 tablet 3   nitroGLYCERIN  (NITROSTAT ) 0.4 MG SL tablet Place 1 tablet (0.4 mg total) under the tongue every 5 (five) minutes x 3 doses as needed for chest pain. 75 tablet 1   potassium chloride  20 MEQ TBCR Take 0.5 tablets (10 mEq total) by mouth daily. 90 tablet  3   Respiratory Therapy Supplies (FLUTTER) DEVI Twice a day and prn as needed, may increase if feeling worse 1 each 0   rosuvastatin  (CRESTOR ) 10 MG tablet TAKE 1 TABLET ON MONDAY, WEDNESDAY & FRIDAY. 45 tablet 3   spironolactone (ALDACTONE) 25 MG tablet Take 0.5 tablets (12.5 mg total) by mouth daily. 15 tablet 3   vardenafil  (LEVITRA ) 20 MG tablet TAKE 1 TABLET BY MOUTH AS DIRECTED. CAN NOT BE TAKEN WITH NITROGLYCERIN . NOT COVERED BY INSURANCE 6 tablet 2   No current facility-administered medications for this visit.     Past Medical History:  Diagnosis Date   Arthritis    thumbs (07/25/2018)   Asthma    CHF (congestive heart failure) (HCC)    Chronic bronchitis (HCC)    Environmental  allergies    GERD (gastroesophageal reflux disease)    Headache    seasonal; w/environmental allergies (07/25/2018)   History of blood transfusion    when I had laminectomy (07/25/2018)   HTN (hypertension)    Hyperlipidemia    LBBB (left bundle branch block) 1999   Metatarsal bone fracture 03/07/2018   Myocardial infarction [facility])    was told I've had an old MI; probably in the 1990s (07/25/2018)   Pneumonia    as a child, age 68; viral pneumonia 3 times in the last 10 years (07/25/2018)   Presence of permanent cardiac pacemaker 07/25/2018   Seasonal allergies    Sleep apnea    wife says I do (07/25/2018)   Type II diabetes mellitus (HCC)     ROS:   All systems reviewed and negative except as noted in the HPI.   Past Surgical History:  Procedure Laterality Date   BACK SURGERY     BIV PACEMAKER INSERTION CRT-P  07/25/2018   BIV PACEMAKER INSERTION CRT-P N/A 07/25/2018   Procedure: BIV PACEMAKER INSERTION CRT-P;  Surgeon: Waddell Danelle ORN, MD;  Location: [facility] INVASIVE CV LAB;  Service: Cardiovascular;  Laterality: N/A;   CARDIAC CATHETERIZATION  2003   Dr Esmeralda Sharps; 85 % R circumflex obstruction   CARPAL TUNNEL RELEASE Left 10/11/2014   Procedure: LEFT CARPAL TUNNEL RELEASE;  Surgeon: Elsie Mussel, MD;  Location: [facility];  Service: Orthopedics;  Laterality: Left;   COLONOSCOPY W/ BIOPSIES AND POLYPECTOMY  2018   INGUINAL HERNIA REPAIR Right 1948   INGUINAL HERNIA REPAIR Left 1988   LUMBAR LAMINECTOMY  1984   MINOR CARPAL TUNNEL Right 11/22/2014   Procedure: RIGHT LIMITED OPEN CARPAL TUNNEL RELEASE;  Surgeon: Elsie Mussel, MD;  Location: [facility];  Service: Orthopedics;  Laterality: Right;   TONSILLECTOMY  1958     Family History  Problem Relation Age of Onset   Heart attack Father        77s   Colon polyps Father    Heart failure Mother        90s   Subarachnoid hemorrhage Mother 45   Hypertension Mother    Subarachnoid  hemorrhage Paternal Grandfather 26   Diabetes Maternal Aunt    Colon cancer Neg Hx      Social History   Socioeconomic History   Marital status: Married    Spouse name: Not on file   Number of children: 3   Years of education: Not on file   Highest education level: Professional school degree (e.g., MD, DDS, DVM, JD)  Occupational History   Occupation: retired Scientist, forensic: [employer]  Tobacco Use   Smoking  status: Former    Current packs/day: 0.00    Average packs/day: 2.0 packs/day for 2.0 years (4.0 ttl pk-yrs)    Types: Cigarettes    Start date: 12/13/1961    Quit date: 12/14/1963    Years since quitting: 60.8   Smokeless tobacco: Never  Vaping Use   Vaping status: Never Used  Substance and Sexual Activity   Alcohol use: Not Currently    Alcohol/week: 2.0 standard drinks of alcohol    Types: 2 Glasses of wine per week    Comment: occasional   Drug use: Never   Sexual activity: Yes  Other Topics Concern   Not on file  Social History Narrative   Lives with wife in a 2 story home.  Has 3 daughters.  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     Social Drivers of Corporate investment banker Strain: Low Risk  (06/07/2023)   Overall Financial Resource Strain (CARDIA)    Difficulty of Paying Living Expenses: Not hard at all  Food Insecurity: No Food Insecurity (12/04/2023)   Hunger Vital Sign    Worried About Running Out of Food in the Last Year: Never true    Ran Out of Food in the Last Year: Never true  Transportation Needs: No Transportation Needs (12/04/2023)   PRAPARE - Administrator, Civil Service (Medical): No    Lack of Transportation (Non-Medical): No  Physical Activity: Sufficiently Active (06/07/2023)   Exercise Vital Sign    Days of Exercise per Week: 5 days    Minutes of Exercise per Session: 30 min  Stress: No Stress Concern Present (06/07/2023)   [employer] of Occupational Health - Occupational Stress Questionnaire     Feeling of Stress : Not at all  Social Connections: Socially Integrated (06/07/2023)   Social Connection and Isolation Panel    Frequency of Communication with Friends and Family: More than three times a week    Frequency of Social Gatherings with Friends and Family: More than three times a week    Attends Religious Services: More than 4 times per year    Active Member of [employer] or Organizations: Yes    Attends Engineer, structural: More than 4 times per year    Marital Status: Married  Catering manager Violence: Not At Risk (12/04/2023)   Humiliation, Afraid, Rape, and Kick questionnaire    Fear of Current or Ex-Partner: No    Emotionally Abused: No    Physically Abused: No    Sexually Abused: No     BP (!) 103/58 (BP Location: Left Arm, Patient Position: Sitting, Cuff Size: Normal)   Pulse 69   Resp 16   Ht 5' 9 (1.753 m)   Wt 153 lb 9.6 oz (69.7 kg)   SpO2 95%   BMI 22.68 kg/m   Physical Exam:  Well appearing NAD HEENT: Unremarkable Neck:  No JVD, no thyromegally Lymphatics:  No adenopathy Back:  No CVA tenderness Lungs:  Clear with no wheezes HEART:  Regular rate rhythm, no murmurs, no rubs, no clicks Abd:  soft, positive bowel sounds, no organomegally, no rebound, no guarding Ext:  2 plus pulses, no edema, no cyanosis, no clubbing Skin:  No rashes no nodules Neuro:  CN II through XII intact, motor grossly intact  EKG - nsr with biv pacing  DEVICE  Normal device function.  See PaceArt for details.   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 He will continue his current meds. His EF has gone down and a left and right heart cath are pending.  PPM - his St. Jude biv PPM is working normally.  Danelle Anagabriela Jokerst,MD

## 2024-09-28 NOTE — Patient Instructions (Addendum)

## 2024-09-28 NOTE — Progress Notes (Signed)
 EKG

## 2024-10-04 ENCOUNTER — Other Ambulatory Visit: Payer: Self-pay

## 2024-10-08 ENCOUNTER — Encounter (HOSPITAL_COMMUNITY): Payer: Self-pay | Admitting: Cardiology

## 2024-10-08 ENCOUNTER — Other Ambulatory Visit: Payer: Self-pay

## 2024-10-08 ENCOUNTER — Encounter (HOSPITAL_COMMUNITY)
Admission: RE | Disposition: A | Discharge status: Home / Self Care | Payer: Self-pay | Source: Home / Self Care | Attending: Cardiology

## 2024-10-08 ENCOUNTER — Ambulatory Visit (HOSPITAL_COMMUNITY)
Admission: RE | Admit: 2024-10-08 | Discharge: 2024-10-08 | Disposition: A | Discharge status: Home / Self Care | Attending: Cardiology | Admitting: Cardiology

## 2024-10-08 ENCOUNTER — Other Ambulatory Visit (HOSPITAL_COMMUNITY): Payer: Self-pay

## 2024-10-08 DIAGNOSIS — Z79899 Other long term (current) drug therapy: Secondary | ICD-10-CM | POA: Insufficient documentation

## 2024-10-08 DIAGNOSIS — I5022 Chronic systolic (congestive) heart failure: Secondary | ICD-10-CM | POA: Insufficient documentation

## 2024-10-08 DIAGNOSIS — I441 Atrioventricular block, second degree: Secondary | ICD-10-CM | POA: Insufficient documentation

## 2024-10-08 DIAGNOSIS — I428 Other cardiomyopathies: Secondary | ICD-10-CM | POA: Diagnosis not present

## 2024-10-08 DIAGNOSIS — I5042 Chronic combined systolic (congestive) and diastolic (congestive) heart failure: Secondary | ICD-10-CM

## 2024-10-08 DIAGNOSIS — Z7984 Long term (current) use of oral hypoglycemic drugs: Secondary | ICD-10-CM | POA: Diagnosis not present

## 2024-10-08 DIAGNOSIS — I2510 Atherosclerotic heart disease of native coronary artery without angina pectoris: Secondary | ICD-10-CM | POA: Diagnosis not present

## 2024-10-08 DIAGNOSIS — E119 Type 2 diabetes mellitus without complications: Secondary | ICD-10-CM | POA: Diagnosis not present

## 2024-10-08 DIAGNOSIS — Z950 Presence of cardiac pacemaker: Secondary | ICD-10-CM | POA: Diagnosis not present

## 2024-10-08 HISTORY — PX: RIGHT/LEFT HEART CATH AND CORONARY ANGIOGRAPHY: CATH118266

## 2024-10-08 LAB — POCT I-STAT EG7
Acid-Base Excess: 4 mmol/L — ABNORMAL HIGH (ref 0.0–2.0)
Acid-Base Excess: 5 mmol/L — ABNORMAL HIGH (ref 0.0–2.0)
Bicarbonate: 29.3 mmol/L — ABNORMAL HIGH (ref 20.0–28.0)
Bicarbonate: 30 mmol/L — ABNORMAL HIGH (ref 20.0–28.0)
Calcium, Ion: 1.18 mmol/L (ref 1.15–1.40)
Calcium, Ion: 1.2 mmol/L (ref 1.15–1.40)
HCT: 38 % — ABNORMAL LOW (ref 39.0–52.0)
HCT: 38 % — ABNORMAL LOW (ref 39.0–52.0)
Hemoglobin: 12.9 g/dL — ABNORMAL LOW (ref 13.0–17.0)
Hemoglobin: 12.9 g/dL — ABNORMAL LOW (ref 13.0–17.0)
O2 Saturation: 66 %
O2 Saturation: 65 %
Potassium: 4.1 mmol/L (ref 3.5–5.1)
Potassium: 4.2 mmol/L (ref 3.5–5.1)
Sodium: 138 mmol/L (ref 135–145)
Sodium: 138 mmol/L (ref 135–145)
TCO2: 31 mmol/L (ref 22–32)
TCO2: 31 mmol/L (ref 22–32)
pCO2, Ven: 44.6 mmHg (ref 44–60)
pCO2, Ven: 46.1 mmHg (ref 44–60)
pH, Ven: 7.426 (ref 7.25–7.43)
pH, Ven: 7.421 (ref 7.25–7.43)
pO2, Ven: 34 mmHg (ref 32–45)
pO2, Ven: 33 mmHg (ref 32–45)

## 2024-10-08 LAB — GLUCOSE, CAPILLARY: Glucose-Capillary: 130 mg/dL — ABNORMAL HIGH (ref 70–99)

## 2024-10-08 SURGERY — RIGHT/LEFT HEART CATH AND CORONARY ANGIOGRAPHY
Anesthesia: LOCAL

## 2024-10-08 MED ORDER — HEPARIN (PORCINE) IN NACL 2-0.9 UNITS/ML
INTRAMUSCULAR | Status: DC | PRN
  Administered 2024-10-08: 10 mL via INTRA_ARTERIAL

## 2024-10-08 MED ORDER — MIDAZOLAM HCL (PF) 2 MG/2ML IJ SOLN
INTRAMUSCULAR | Status: DC | PRN
Start: 2024-10-08 — End: 2024-10-08
  Administered 2024-10-08 (×2): 1 mg via INTRAVENOUS

## 2024-10-08 MED ORDER — FREE WATER: 250.0000 mL | Freq: Once | Status: DC

## 2024-10-08 MED ORDER — SODIUM CHLORIDE 0.9 % IV SOLN
250.0000 mL | INTRAVENOUS | Status: DC | PRN
Start: 2024-10-08 — End: 2024-10-08

## 2024-10-08 MED ORDER — HEPARIN SODIUM (PORCINE) 1000 UNIT/ML IJ SOLN
INTRAMUSCULAR | Status: DC | PRN
Start: 2024-10-08 — End: 2024-10-08
  Administered 2024-10-08: 3500 [IU] via INTRAVENOUS

## 2024-10-08 MED ORDER — ASPIRIN 81 MG PO CHEW
81.0000 mg | CHEWABLE_TABLET | Freq: Once | ORAL | Status: AC
  Administered 2024-10-08: 81 mg via ORAL

## 2024-10-08 MED ORDER — SODIUM CHLORIDE 0.9% FLUSH: 3.0000 mL | Freq: Two times a day (BID) | INTRAVENOUS | Status: DC

## 2024-10-08 MED ORDER — ASPIRIN 81 MG PO CHEW
CHEWABLE_TABLET | ORAL | Status: AC
  Filled 2024-10-08: qty 1

## 2024-10-08 MED ORDER — MIDAZOLAM HCL 2 MG/2ML IJ SOLN
INTRAMUSCULAR | Status: AC
  Filled 2024-10-08: qty 2

## 2024-10-08 MED ORDER — FENTANYL CITRATE (PF) 100 MCG/2ML IJ SOLN
INTRAMUSCULAR | Status: AC
  Filled 2024-10-08: qty 2

## 2024-10-08 MED ORDER — HYDRALAZINE HCL 20 MG/ML IJ SOLN: 10.0000 mg | INTRAMUSCULAR | Status: DC | PRN

## 2024-10-08 MED ORDER — SODIUM CHLORIDE 0.9 % IV SOLN: 250.0000 mL | INTRAVENOUS | Status: DC | PRN

## 2024-10-08 MED ORDER — DIPHENHYDRAMINE HCL 50 MG/ML IJ SOLN
25.0000 mg | Freq: Once | INTRAMUSCULAR | Status: AC
  Administered 2024-10-08: 25 mg via INTRAVENOUS

## 2024-10-08 MED ORDER — LIDOCAINE HCL (PF) 1 % IJ SOLN
INTRAMUSCULAR | Status: DC | PRN
  Administered 2024-10-08 (×2): 2 mL

## 2024-10-08 MED ORDER — SODIUM CHLORIDE 0.9% FLUSH: 3.0000 mL | INTRAVENOUS | Status: DC | PRN

## 2024-10-08 MED ORDER — METHYLPREDNISOLONE SODIUM SUCC 125 MG IJ SOLR
125.0000 mg | Freq: Once | INTRAMUSCULAR | Status: AC
  Administered 2024-10-08: 125 mg via INTRAVENOUS
  Filled 2024-10-08: qty 2

## 2024-10-08 MED ORDER — ACETAMINOPHEN 325 MG PO TABS: 650.0000 mg | ORAL_TABLET | ORAL | Status: DC | PRN

## 2024-10-08 MED ORDER — ONDANSETRON HCL 4 MG/2ML IJ SOLN: 4.0000 mg | Freq: Four times a day (QID) | INTRAMUSCULAR | Status: DC | PRN

## 2024-10-08 MED ORDER — IOHEXOL 350 MG/ML SOLN
INTRAVENOUS | Status: DC | PRN
  Administered 2024-10-08: 40 mL

## 2024-10-08 MED ORDER — VERAPAMIL HCL 2.5 MG/ML IV SOLN
INTRAVENOUS | Status: AC
  Filled 2024-10-08: qty 2

## 2024-10-08 MED ORDER — FREE WATER: 500.0000 mL | Freq: Once | Status: DC

## 2024-10-08 MED ORDER — DIPHENHYDRAMINE HCL 50 MG/ML IJ SOLN
50.0000 mg | Freq: Once | INTRAMUSCULAR | Status: DC
  Filled 2024-10-08: qty 1

## 2024-10-08 MED ORDER — LABETALOL HCL 5 MG/ML IV SOLN: 10.0000 mg | INTRAVENOUS | Status: DC | PRN

## 2024-10-08 MED ORDER — HEPARIN (PORCINE) IN NACL 1000-0.9 UT/500ML-% IV SOLN
INTRAVENOUS | Status: DC | PRN
Start: 2024-10-08 — End: 2024-10-08
  Administered 2024-10-08 (×2): 500 mL

## 2024-10-08 MED ORDER — FENTANYL CITRATE (PF) 100 MCG/2ML IJ SOLN
INTRAMUSCULAR | Status: DC | PRN
  Administered 2024-10-08 (×2): 25 ug via INTRAVENOUS

## 2024-10-08 MED ORDER — HEPARIN SODIUM (PORCINE) 1000 UNIT/ML IJ SOLN
INTRAMUSCULAR | Status: AC
  Filled 2024-10-08: qty 10

## 2024-10-08 SURGICAL SUPPLY — 11 items
CATH BALLN WEDGE 5F 110CM (CATHETERS) IMPLANT
CATH INFINITI 5 FR JL3.5 (CATHETERS) IMPLANT
CATH INFINITI JR4 5F (CATHETERS) IMPLANT
DEVICE RAD COMP TR BAND LRG (VASCULAR PRODUCTS) IMPLANT
GLIDESHEATH SLEND SS 6F .021 (SHEATH) IMPLANT
GUIDEWIRE .025 260CM (WIRE) IMPLANT
GUIDEWIRE INQWIRE 1.5J.035X260 (WIRE) IMPLANT
KIT SYRINGE INJ CVI SPIKEX1 (MISCELLANEOUS) IMPLANT
PACK CARDIAC CATHETERIZATION (CUSTOM PROCEDURE TRAY) ×1 IMPLANT
SET ATX-X65L (MISCELLANEOUS) IMPLANT
SHEATH GLIDE SLENDER 4/5FR (SHEATH) IMPLANT

## 2024-10-08 NOTE — Discharge Instructions (Signed)
 NO METFORMIN  FOR 2 DAYS

## 2024-10-08 NOTE — Interval H&P Note (Signed)
 History and Physical Interval Note:  10/08/2024 7:54 AM  Jeffery [name]  has presented today for surgery, with the diagnosis of heart failure.  The various methods of treatment have been discussed with the patient and family. After consideration of risks, benefits and other options for treatment, the patient has consented to  Procedure(s): RIGHT/LEFT HEART CATH AND CORONARY ANGIOGRAPHY (N/A) as a surgical intervention.  The patient's history has been reviewed, patient examined, no change in status, stable for surgery.  I have reviewed the patient's chart and labs.  Questions were answered to the patient's satisfaction.     Da [employer]

## 2024-10-17 ENCOUNTER — Ambulatory Visit: Admitting: Cardiovascular Disease

## 2024-10-18 ENCOUNTER — Encounter (HOSPITAL_COMMUNITY): Payer: Self-pay | Admitting: Cardiology

## 2024-10-18 ENCOUNTER — Ambulatory Visit (HOSPITAL_COMMUNITY)
Admission: RE | Admit: 2024-10-18 | Discharge: 2024-10-18 | Disposition: A | Discharge status: Home / Self Care | Source: Ambulatory Visit | Attending: Cardiology | Admitting: Cardiology

## 2024-10-18 VITALS — BP 102/58 | HR 77 | Ht 69.0 in | Wt 152.0 lb

## 2024-10-18 DIAGNOSIS — E119 Type 2 diabetes mellitus without complications: Secondary | ICD-10-CM | POA: Insufficient documentation

## 2024-10-18 DIAGNOSIS — I5042 Chronic combined systolic (congestive) and diastolic (congestive) heart failure: Secondary | ICD-10-CM | POA: Diagnosis not present

## 2024-10-18 DIAGNOSIS — Z79899 Other long term (current) drug therapy: Secondary | ICD-10-CM | POA: Insufficient documentation

## 2024-10-18 DIAGNOSIS — I429 Cardiomyopathy, unspecified: Secondary | ICD-10-CM | POA: Diagnosis not present

## 2024-10-18 DIAGNOSIS — Z7984 Long term (current) use of oral hypoglycemic drugs: Secondary | ICD-10-CM | POA: Diagnosis not present

## 2024-10-18 DIAGNOSIS — Z8679 Personal history of other diseases of the circulatory system: Secondary | ICD-10-CM | POA: Insufficient documentation

## 2024-10-18 DIAGNOSIS — I5022 Chronic systolic (congestive) heart failure: Secondary | ICD-10-CM | POA: Insufficient documentation

## 2024-10-18 DIAGNOSIS — I2510 Atherosclerotic heart disease of native coronary artery without angina pectoris: Secondary | ICD-10-CM | POA: Diagnosis not present

## 2024-10-18 MED ORDER — VALSARTAN 40 MG PO TABS: 20.0000 mg | ORAL_TABLET | Freq: Every day | ORAL | 3 refills | Status: DC

## 2024-10-18 MED ORDER — SPIRONOLACTONE 25 MG PO TABS: 25.0000 mg | ORAL_TABLET | Freq: Every day | ORAL | 3 refills | Status: DC

## 2024-10-18 NOTE — Patient Instructions (Addendum)
 CHANGE Spironolactone to 25 mg daily.  START Valsartan to 20 mg ( 1/2 Tab) daily.  Blood work in 10 days.  Please follow up with our heart failure pharmacist as scheduled.  Your physician recommends that you schedule a follow-up appointment as scheduled.  If you have any questions or concerns before your next appointment please send us  a message through [place] or call our office at [phone].    TO LEAVE A MESSAGE FOR THE NURSE SELECT OPTION 2, PLEASE LEAVE A MESSAGE INCLUDING: YOUR NAME DATE OF BIRTH CALL BACK NUMBER REASON FOR CALL**this is important as we prioritize the call backs  YOU WILL RECEIVE A CALL BACK THE SAME DAY AS LONG AS YOU CALL BEFORE 4:00 PM  At the [facility] Clinic, you and your health needs are our priority. As part of our continuing mission to provide you with exceptional heart care, we have created designated Provider Care Teams. These Care Teams include your primary Cardiologist (physician) and Advanced Practice Providers (APPs- Physician Assistants and Nurse Practitioners) who all work together to provide you with the care you need, when you need it.   You may see any of the following providers on your designated Care Team at your next follow up: Dr Toribio Fuel Dr Ezra Shuck Dr. Morene Brownie Greig Mosses, NP Caffie Shed, [place] [facility] [place], [place] Beckey Coe, NP Jordan Lee, NP Ellouise Class, NP Tinnie Redman, PharmD Jaun Bash, PharmD   Please be sure to bring in all your medications bottles to every appointment.    Thank you for choosing [facility]

## 2024-10-19 NOTE — Progress Notes (Signed)
 ADVANCED HEART FAILURE CLINIC NOTE  Referring Physician: Geofm Glade PARAS, MD  Primary Care: Geofm Glade PARAS, MD HF Cardiology: Dr. Rolan  CC: CHF  HPI: Jeffery [name] is a 83 y.o. male with chronic systolic CHF, diabetes, and type 2 2nd degree AVB who was referred by Dr. Court for evaluation of CHF.  Patient has had a long history of mild cardiomyopathy, EF in the 40-45% range, since around 2002. 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  St Jude CRT-P device placed.  Cath in 2002 showed mild nonobstructive CAD.  Echo in 4/23 and 12/24 showed EF 40-45%.  In the summer of 2025, he began to note increased exertional dyspnea.  He was short of breath walking up 1/2 flight of stairs.  He was started on Lasix  20 mg daily later increased to 40 mg daily.  With the addition of Lasix , his breathing returned to baseline. Cardiolite in 8/25 showed partially reversible inferior defect with EF 29%.  To confirm the fall in EF, he had an echo done in 9/25 showing EF 25-30% with moderate RV dysfunction.    LHC/RHC in 10/25 showed nonobstructive CAD, normal filling pressures and CI 2.61.    He returns for followup of CHF.  Doing well, no dyspnea with usual activities.  He is quite active in general. No lightheadedness or palpitations.  No chest pain.  No orthopnea/PND.    Labs (9/25): K 3.9, creatinine 1.07 Labs (10/25): K 4.4, creatinine 1.09  ECG (personally reviewed): NSR, BiV paced  PMH: 1. Chronic LBBB 2. Chronic systolic CHF: Cardiomyopathy dating from as early as 2002. St Jude CRT-P device 2019.  - LHC (2002): Nonobstructive CAD. - Echo (4/23): EF 40-45% - Echo (12/24): EF 40-45%, mild RV dysfunction.  - Cardiolite (8/25): EF 29%, partially reversible inferior defect.  - Echo (9/25): EF 25-30%, moderate RV dysfunction, PASP 41 mmHg.  - LHC/RHC (10/25): Nonobstructive CAD; mean RA 5, PA 37/15 mean 23, mean PCWP 9, CI 2.61, PVR 2.9 WU, PAPi 4.4.  3. Mobitz type 2  2nd degree AVB: History of syncope.  - St Jude CRT-P device placed in 2019.  4. Asthma 5. GERD 6. Type 2 diabetes  SH: Married, retired SCIENTIST, CLINICAL (HISTOCOMPATIBILITY AND IMMUNOGENETICS), lives in [place], nonsmoker, occasional ETOH (not heavy), no drugs.   Family History  Problem Relation Age of Onset   Heart attack Father        75s   Colon polyps Father    Heart failure Mother        90s   Subarachnoid hemorrhage Mother 30   Hypertension Mother    Subarachnoid hemorrhage Paternal Grandfather 62   Diabetes Maternal Aunt    Colon cancer Neg Hx    ROS: All systems reviewed and negative except as per HPI.   Current Outpatient Medications  Medication Sig Dispense Refill   albuterol  (PROVENTIL ) (2.5 MG/3ML) 0.083% nebulizer solution Take 3 mLs (2.5 mg total) by nebulization every 6 (six) hours as needed for wheezing or shortness of breath. 150 mL 5   albuterol  (VENTOLIN  HFA) 108 (90 Base) MCG/ACT inhaler TAKE 2 PUFFS BY MOUTH EVERY 6 HOURS AS NEEDED FOR WHEEZE OR SHORTNESS OF BREATH 6.7 each 11   aspirin  EC 81 MG tablet Take 1 tablet (81 mg total) by mouth at bedtime. Swallow whole. 30 tablet 12   bisoprolol (ZEBETA) 5 MG tablet Take 0.5 tablets (2.5 mg total) by mouth daily. 15 tablet 3   Continuous Blood Gluc Receiver (FREESTYLE  LIBRE READER) DEVI 1 Device by Does not apply route every 14 (fourteen) days. Freestyle libre 3 reader 1 each 0   Continuous Glucose Sensor (FREESTYLE LIBRE 14 DAY SENSOR) MISC APPLY 1 SENSOR EVERY 14 DAYS 6 each 3   dapagliflozin  propanediol (FARXIGA ) 10 MG TABS tablet Take 1 tablet (10 mg total) by mouth daily before breakfast. 90 tablet 3   fluticasone -salmeterol (ADVAIR HFA) 230-21 MCG/ACT inhaler Inhale 2 puffs into the lungs 2 (two) times daily.     furosemide  (LASIX ) 40 MG tablet Take 1 tablet (40 mg total) by mouth daily. 90 tablet 3   glipiZIDE  (GLUCOTROL ) 5 MG tablet TAKE 1-2 TABLETS (5-10 MG TOTAL) BY MOUTH 2 (TWO) TIMES DAILY BEFORE A MEAL. 180 tablet 1   HYDROcodone -acetaminophen  (NORCO)  7.5-325 MG tablet Take 1 tablet by mouth every 6 (six) hours as needed for severe pain (pain score 7-10) (for chronic joint pain, neck pain). 90 tablet 0   ipratropium (ATROVENT ) 0.02 % nebulizer solution TAKE 2.5 ML (0.5 MG TOTAL) BY NEBULIZATION 4 TIMES A DAY 125 mL 3   LORazepam  (ATIVAN ) 1 MG tablet TAKE 1/2 TO 1 TABLET BY MOUTH NIGHTLY AS NEEDED. 30 tablet 5   metFORMIN  (GLUCOPHAGE ) 500 MG tablet TAKE 1 TABLET BY MOUTH TWICE A DAY WITH FOOD 180 tablet 1   montelukast  (SINGULAIR ) 10 MG tablet Take 1 tablet (10 mg total) by mouth at bedtime. 100 tablet 3   nitroGLYCERIN  (NITROSTAT ) 0.4 MG SL tablet Place 1 tablet (0.4 mg total) under the tongue every 5 (five) minutes x 3 doses as needed for chest pain. 75 tablet 1   potassium chloride  20 MEQ TBCR Take 0.5 tablets (10 mEq total) by mouth daily. 90 tablet 3   Respiratory Therapy Supplies (FLUTTER) DEVI Twice a day and prn as needed, may increase if feeling worse 1 each 0   rosuvastatin  (CRESTOR ) 10 MG tablet TAKE 1 TABLET ON MONDAY, WEDNESDAY & FRIDAY. 45 tablet 3   valsartan (DIOVAN) 40 MG tablet Take 0.5 tablets (20 mg total) by mouth daily. 45 tablet 3   vardenafil  (LEVITRA ) 20 MG tablet TAKE 1 TABLET BY MOUTH AS DIRECTED. CAN NOT BE TAKEN WITH NITROGLYCERIN . NOT COVERED BY INSURANCE 6 tablet 2   ACCU-CHEK AVIVA PLUS test strip USE AS INSTRUCTED TO TEST BLOOD SUGAR 6 TIMES DAILY. DX CODE: E11.59 (Patient not taking: Reported on 10/18/2024) 500 strip 4   ACCU-CHEK SOFTCLIX LANCETS lancets Use to test blood sugar daily as directed. (Patient not taking: Reported on 10/18/2024) 100 each 3   spironolactone (ALDACTONE) 25 MG tablet Take 1 tablet (25 mg total) by mouth daily. 90 tablet 3   No current facility-administered medications for this encounter.   BP (!) 102/58   Pulse 77   Ht 5' 9 (1.753 m)   Wt 68.9 kg (152 lb)   SpO2 98%   BMI 22.45 kg/m  General: NAD Neck: No JVD, no thyromegaly or thyroid  nodule.  Lungs: Clear to auscultation  bilaterally with normal respiratory effort. CV: Nondisplaced PMI.  Heart regular S1/S2, no S3/S4, no murmur.  No peripheral edema.  No carotid bruit.  Normal pedal pulses.  Abdomen: Soft, nontender, no hepatosplenomegaly, no distention.  Skin: Intact without lesions or rashes.  Neurologic: Alert and oriented x 3.  Psych: Normal affect. Extremities: No clubbing or cyanosis.  HEENT: Normal.   Assessment/Plan: 1. Chronic systolic CHF: Cardiomyopathy of uncertain etiology. H/o LBBB. He has had a mild cardiomyopathy with EF 40-45% documented for > 20 years.  LHC in 2002 showed nonobstructive CAD.  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  Worsened symptoms summer 2025, he was started on Lasix .  Cardiolite in 8/25 showed EF 29%, partially reversible inferior perfusion defect.  Echo in 9/25 showed EF 25-30%, moderate RV dysfunction, PASP 41 mmHg.  He has been BiV pacing appropriately. LHC/RHC in 10/25 showed no significant CAD, normal filling pressures, CI 2.61. NYHA class I-II, not volume overloaded on exam.  - Continue current Lasix  40 mg daily.  - Continue Farxiga  10 mg daily.  - Continue bisoprolol 2.5 daily.  - Increase spironolactone to 25 mg daily with BMET/BNP today and BMET in 10 days.  - Add valsartan 20 mg daily, BP may be too low for Entresto.  If BP tolerates valsartan, can transition to Entresto.  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  Would be reasonable to offer genetic testing.  We talked about this today and he wants to defer.  - Cardiac MRI has been scheduled for later this month to look for prior myocarditis, infiltrative disease, etc.  - If EF does not improve with medical management, can consider ICD though he is of advanced age.  I would probably not recommend unless he has a lot of delayed enhancement on cardiac MRI.  2. Type 2 2nd degree AVB: Has St Jude  CRT-P device.  As above, with combination of heart block and cardiomyopathy, consider Lamin, TTN, and SCN5A cardiomyopathies.   Followup with HF pharmacist in 3 wks for med titration, see APP in 6 wks.   I spent 31 minutes reviewing chart, interacting with patient, reviewed LVAD parameters, and managing orders.   Ezra Shuck 10/19/2024

## 2024-10-26 ENCOUNTER — Ambulatory Visit (INDEPENDENT_AMBULATORY_CARE_PROVIDER_SITE_OTHER)

## 2024-10-26 ENCOUNTER — Encounter (HOSPITAL_COMMUNITY): Payer: Self-pay

## 2024-10-26 DIAGNOSIS — I5042 Chronic combined systolic (congestive) and diastolic (congestive) heart failure: Secondary | ICD-10-CM | POA: Diagnosis not present

## 2024-10-28 ENCOUNTER — Ambulatory Visit: Payer: Self-pay | Admitting: Internal Medicine

## 2024-10-28 LAB — CUP PACEART REMOTE DEVICE CHECK
Battery Remaining Longevity: 17 mo
Battery Remaining Percentage: 20 %
Battery Voltage: 2.89 V
Brady Statistic AP VP Percent: 6.6 %
Brady Statistic AP VS Percent: 1 %
Brady Statistic AS VP Percent: 87 %
Brady Statistic AS VS Percent: 4.2 %
Brady Statistic RA Percent Paced: 4.9 %
Date Time Interrogation Session: 20251114020009
Implantable Lead Connection Status: 753985
Implantable Lead Connection Status: 753985
Implantable Lead Connection Status: 753985
Implantable Lead Implant Date: 20190813
Implantable Lead Implant Date: 20190813
Implantable Lead Implant Date: 20190813
Implantable Lead Location: 753858
Implantable Lead Location: 753859
Implantable Lead Location: 753860
Implantable Pulse Generator Implant Date: 20190813
Lead Channel Impedance Value: 430 Ohm
Lead Channel Impedance Value: 430 Ohm
Lead Channel Impedance Value: 530 Ohm
Lead Channel Pacing Threshold Amplitude: 0.5 V
Lead Channel Pacing Threshold Amplitude: 0.625 V
Lead Channel Pacing Threshold Amplitude: 1.25 V
Lead Channel Pacing Threshold Pulse Width: 0.5 ms
Lead Channel Pacing Threshold Pulse Width: 0.5 ms
Lead Channel Pacing Threshold Pulse Width: 0.5 ms
Lead Channel Sensing Intrinsic Amplitude: 2.4 mV
Lead Channel Sensing Intrinsic Amplitude: 7.7 mV
Lead Channel Setting Pacing Amplitude: 1.625
Lead Channel Setting Pacing Amplitude: 2.25 V
Lead Channel Setting Pacing Amplitude: 2.5 V
Lead Channel Setting Pacing Pulse Width: 0.5 ms
Lead Channel Setting Pacing Pulse Width: 0.5 ms
Lead Channel Setting Sensing Sensitivity: 5 mV
Pulse Gen Model: 3562
Pulse Gen Serial Number: 9043864

## 2024-10-29 ENCOUNTER — Encounter: Payer: Self-pay | Admitting: Internal Medicine

## 2024-10-29 ENCOUNTER — Ambulatory Visit (HOSPITAL_COMMUNITY)
Admission: RE | Admit: 2024-10-29 | Discharge: 2024-10-29 | Disposition: A | Discharge status: Home / Self Care | Source: Ambulatory Visit | Attending: Cardiology | Admitting: Cardiology

## 2024-10-29 ENCOUNTER — Ambulatory Visit: Payer: Self-pay

## 2024-10-29 ENCOUNTER — Ambulatory Visit (HOSPITAL_COMMUNITY): Payer: Self-pay | Admitting: Cardiology

## 2024-10-29 DIAGNOSIS — I5042 Chronic combined systolic (congestive) and diastolic (congestive) heart failure: Secondary | ICD-10-CM | POA: Diagnosis not present

## 2024-10-29 LAB — BASIC METABOLIC PANEL WITH GFR
Anion gap: 18 — ABNORMAL HIGH (ref 5–15)
BUN: 27 mg/dL — ABNORMAL HIGH (ref 8–23)
CO2: 21 mmol/L — ABNORMAL LOW (ref 22–32)
Calcium: 9.1 mg/dL (ref 8.9–10.3)
Chloride: 92 mmol/L — ABNORMAL LOW (ref 98–111)
Creatinine, Ser: 1.42 mg/dL — ABNORMAL HIGH (ref 0.61–1.24)
GFR, Estimated: 50 mL/min — ABNORMAL LOW (ref >60)
Glucose, Bld: 166 mg/dL — ABNORMAL HIGH (ref 70–99)
Potassium: 4.9 mmol/L (ref 3.5–5.1)
Sodium: 131 mmol/L — ABNORMAL LOW (ref 135–145)

## 2024-10-29 LAB — BRAIN NATRIURETIC PEPTIDE: B Natriuretic Peptide: 857.7 pg/mL — ABNORMAL HIGH (ref 0.0–100.0)

## 2024-10-29 NOTE — Telephone Encounter (Signed)
 FYI Only or Action Required?: FYI only for provider: appointment scheduled on 10/30/24.  Patient was last seen in primary care on 08/07/2024 by Geofm Glade PARAS, MD.  Called Nurse Triage reporting Shoulder Pain.  Symptoms began several months ago.  Interventions attempted: OTC medications: Excedrin.  Symptoms are: stable.  Triage Disposition: See PCP When Office is Open (Within 3 Days)  Patient/caregiver understands and will follow disposition?: Yes    Copied from CRM #[id number]. Topic: Clinical - Red Word Triage >> Oct 29, 2024  9:50 AM Jeffery [name] wrote: Red Word that prompted transfer to Nurse Triage: Patient eith continuous back and left shoulder pain. No improvements with pain medications, unable to sleep. Reason for Disposition  [1] MODERATE pain (e.g., interferes with normal activities) AND [2] present > 3 days  Answer Assessment - Initial Assessment Questions Weight loss of 45 lbs not intentional, concerned about that. Would like someone to evaluate to see what is going on. Taking in a breath pain across shoulders, cough ongoing. He mentions he has a lot of symptoms going on and just need Dr. Geofm to look at him, no appetite, not as active as he was months ago. He denies CP with the breathing and shoulder pain. He reports taking Excedrin and having gastritis issues, abdominal pain. Advised it's best to stop taking Excedrin and use Tylenol  for the pain because Excedrin tends to make the stomach issues worse, he verbalized understanding. Advised OV tomorrow with PCP, he agreed.    1. ONSET: When did the pain start?     Ongoing for a while  2. LOCATION: Where is the pain located?     Left shoulder all the way down to the lower back towards hip  3. PAIN: How bad is the pain? (Scale 1-10; or mild, moderate, severe)     6  4. WORK OR EXERCISE: Has there been any recent work or exercise that involved this part of the body?     No  5. CAUSE: What do you think is causing the  shoulder pain?     Unsure  6. OTHER SYMPTOMS: Do you have any other symptoms? (e.g., neck pain, swelling, rash, fever, numbness, weakness)     Abdominal pain, no appetite  Protocols used: Shoulder Pain-A-AH

## 2024-10-29 NOTE — Progress Notes (Signed)
 "   Subjective:    Patient ID: Jeffery [name], male    DOB: February 01, 1941, 83 y.o.   MRN: [id number] okay    HPI James is here for  Chief Complaint  Patient presents with   Back Pain   Shoulder Pain    Left shoulder pain     Discussed the use of AI scribe software for clinical note transcription with the patient, who gave verbal consent to proceed.  History of Present Illness BING DUFFEY is an 83 year old male with heart failure who presents with worsening pain and decreased appetite. He is accompanied by Bruna, his wife.  He has a history of heart failure with reduced ejection fraction, initially managed with IV  Lasix  during a hospitalization in December of the previous year. Despite extensive studies, no significant changes were noted in his cardiac status at that time. By April to May, he experienced significant weight loss without dietary changes, leading to a cardiology consultation where a significant drop in ejection fraction was noted. He was started on Lasix  and other medications to manage his heart condition, but adherence has been challenging due to side effects and difficulty in managing the regimen.  He experiences new and severe pain, particularly after physical activity, radiating from his left shoulder/upper back to his hip and lower back. He has been using hydrocodone  for pain management but stopped due to its impact on his sensorium, switching to Tylenol , which he finds ineffective. He reports waking up at night due to pain and describes his sleep as awful.  He notes a decrease in appetite, stating he is eating less than before and has developed mild cachexia. He also reports thick mucus and phlegm, dry mouth, and difficulty swallowing. He experiences shortness of breath after walking short distances and some wheezing, which he attributes to his asthma, particularly during this season. He uses ipratropium daily, which provides minimal relief.  No chest pain, but he  reports pain in the ribs when breathing deeply. He has no edema, palpitations, or significant gastrointestinal issues. He experiences generalized muscle soreness and weakness, particularly in the legs. No fever, headaches, or lightheadedness, but notes dizziness upon standing.  His recent blood work showed elevated creatinine and BUN, indicating dehydration, with a GFR of 50. He has been on multiple medications for heart failure, which contribute to his dehydration. He has not been taking one of the prescribed medications due to low blood pressure readings.     Medications and allergies reviewed with patient and updated if appropriate.  Current Outpatient Medications on File Prior to Visit  Medication Sig Dispense Refill   albuterol  (PROVENTIL ) (2.5 MG/3ML) 0.083% nebulizer solution Take 3 mLs (2.5 mg total) by nebulization every 6 (six) hours as needed for wheezing or shortness of breath. 150 mL 5   albuterol  (VENTOLIN  HFA) 108 (90 Base) MCG/ACT inhaler TAKE 2 PUFFS BY MOUTH EVERY 6 HOURS AS NEEDED FOR WHEEZE OR SHORTNESS OF BREATH 6.7 each 11   aspirin  EC 81 MG tablet Take 1 tablet (81 mg total) by mouth at bedtime. Swallow whole. 30 tablet 12   bisoprolol  (ZEBETA ) 5 MG tablet Take 0.5 tablets (2.5 mg total) by mouth daily. 15 tablet 3   Continuous Blood Gluc Receiver (FREESTYLE LIBRE READER) DEVI 1 Device by Does not apply route every 14 (fourteen) days. Freestyle libre 3 reader 1 each 0   Continuous Glucose Sensor (FREESTYLE LIBRE 14 DAY SENSOR) MISC APPLY 1 SENSOR EVERY 14 DAYS 6 each 3  dapagliflozin  propanediol (FARXIGA ) 10 MG TABS tablet Take 1 tablet (10 mg total) by mouth daily before breakfast. 90 tablet 3   fluticasone -salmeterol (ADVAIR HFA) 230-21 MCG/ACT inhaler Inhale 2 puffs into the lungs 2 (two) times daily.     furosemide  (LASIX ) 40 MG tablet Take 1 tablet (40 mg total) by mouth daily. 90 tablet 3   glipiZIDE  (GLUCOTROL ) 5 MG tablet TAKE 1-2 TABLETS (5-10 MG TOTAL) BY MOUTH 2  (TWO) TIMES DAILY BEFORE A MEAL. 180 tablet 1   HYDROcodone -acetaminophen  (NORCO) 7.5-325 MG tablet Take 1 tablet by mouth every 6 (six) hours as needed for severe pain (pain score 7-10) (for chronic joint pain, neck pain). 90 tablet 0   ipratropium (ATROVENT ) 0.02 % nebulizer solution TAKE 2.5 ML (0.5 MG TOTAL) BY NEBULIZATION 4 TIMES A DAY 125 mL 3   LORazepam  (ATIVAN ) 1 MG tablet TAKE 1/2 TO 1 TABLET BY MOUTH NIGHTLY AS NEEDED. 30 tablet 5   metFORMIN  (GLUCOPHAGE ) 500 MG tablet TAKE 1 TABLET BY MOUTH TWICE A DAY WITH FOOD 180 tablet 1   montelukast  (SINGULAIR ) 10 MG tablet Take 1 tablet (10 mg total) by mouth at bedtime. 100 tablet 3   nitroGLYCERIN  (NITROSTAT ) 0.4 MG SL tablet Place 1 tablet (0.4 mg total) under the tongue every 5 (five) minutes x 3 doses as needed for chest pain. 75 tablet 1   potassium chloride  20 MEQ TBCR Take 0.5 tablets (10 mEq total) by mouth daily. 90 tablet 3   Respiratory Therapy Supplies (FLUTTER) DEVI Twice a day and prn as needed, may increase if feeling worse 1 each 0   rosuvastatin  (CRESTOR ) 10 MG tablet TAKE 1 TABLET ON MONDAY, WEDNESDAY & FRIDAY. 45 tablet 3   spironolactone  (ALDACTONE ) 25 MG tablet Take 1 tablet (25 mg total) by mouth daily. 90 tablet 3   valsartan  (DIOVAN ) 40 MG tablet Take 0.5 tablets (20 mg total) by mouth daily. 45 tablet 3   vardenafil  (LEVITRA ) 20 MG tablet TAKE 1 TABLET BY MOUTH AS DIRECTED. CAN NOT BE TAKEN WITH NITROGLYCERIN . NOT COVERED BY INSURANCE 6 tablet 2   No current facility-administered medications on file prior to visit.    Review of Systems  Constitutional:  Positive for appetite change and fatigue. Negative for fever.  HENT:         Dry mouth, thick mucus  Respiratory:  Positive for cough (mucus - hard to get up), shortness of breath and wheezing. Negative for chest tightness.   Cardiovascular:  Negative for chest pain, palpitations and leg swelling.  Gastrointestinal:  Negative for abdominal pain, constipation and  diarrhea.       Belching  Musculoskeletal:  Positive for back pain.       Posterior left shoulder/upper back   Neurological:  Positive for dizziness. Negative for light-headedness and headaches.  Psychiatric/Behavioral:  Positive for sleep disturbance.        Objective:   Vitals:   10/30/24 0834  BP: (!) 140/70  Pulse: (!) 51  Temp: 98.1 F (36.7 C)  SpO2: 99%   BP Readings from Last 3 Encounters:  10/30/24 (!) 140/70  10/18/24 (!) 102/58  10/08/24 105/61   Wt Readings from Last 3 Encounters:  10/30/24 146 lb (66.2 kg)  10/18/24 152 lb (68.9 kg)  10/08/24 149 lb (67.6 kg)   Body mass index is 21.56 kg/m.    Physical Exam Constitutional:      General: He is not in acute distress.    Appearance: He is not ill-appearing.  Comments: Frail , mildly cachectic with temporal wasting and diffuse loss of muscle mass  HENT:     Head: Normocephalic and atraumatic.  Eyes:     Conjunctiva/sclera: Conjunctivae normal.  Cardiovascular:     Rate and Rhythm: Normal rate and regular rhythm.     Heart sounds: Murmur (1/6 systolic) heard.  Pulmonary:     Effort: Pulmonary effort is normal. No respiratory distress.     Breath sounds: Normal breath sounds. No wheezing or rales.     Comments: Wet sounding cough Musculoskeletal:     Right lower leg: No edema.     Left lower leg: No edema.  Skin:    General: Skin is warm and dry.     Findings: No rash.  Neurological:     Mental Status: He is alert. Mental status is at baseline.  Psychiatric:        Mood and Affect: Mood normal.            Assessment & Plan:    See Problem List for Assessment and Plan of chronic medical problems.      "

## 2024-10-29 NOTE — Progress Notes (Signed)
 Remote PPM Transmission

## 2024-10-30 ENCOUNTER — Ambulatory Visit (HOSPITAL_COMMUNITY): Admission: RE | Admit: 2024-10-30 | Source: Ambulatory Visit

## 2024-10-30 ENCOUNTER — Other Ambulatory Visit: Payer: Self-pay

## 2024-10-30 ENCOUNTER — Ambulatory Visit: Admitting: Internal Medicine

## 2024-10-30 VITALS — BP 140/70 | HR 51 | Temp 98.1°F | Ht 69.0 in | Wt 146.0 lb

## 2024-10-30 DIAGNOSIS — E1159 Type 2 diabetes mellitus with other circulatory complications: Secondary | ICD-10-CM

## 2024-10-30 DIAGNOSIS — G479 Sleep disorder, unspecified: Secondary | ICD-10-CM

## 2024-10-30 DIAGNOSIS — I2510 Atherosclerotic heart disease of native coronary artery without angina pectoris: Secondary | ICD-10-CM

## 2024-10-30 DIAGNOSIS — M549 Dorsalgia, unspecified: Secondary | ICD-10-CM

## 2024-10-30 DIAGNOSIS — I5042 Chronic combined systolic (congestive) and diastolic (congestive) heart failure: Secondary | ICD-10-CM | POA: Diagnosis not present

## 2024-10-30 DIAGNOSIS — J45909 Unspecified asthma, uncomplicated: Secondary | ICD-10-CM | POA: Diagnosis not present

## 2024-10-30 MED ORDER — TRAMADOL HCL 50 MG PO TABS: 50.0000 mg | ORAL_TABLET | Freq: Three times a day (TID) | ORAL | 0 refills | Status: DC | PRN

## 2024-10-30 MED ORDER — MIRTAZAPINE 7.5 MG PO TABS: 7.5000 mg | ORAL_TABLET | Freq: Every day | ORAL | 5 refills | Status: DC

## 2024-10-30 NOTE — Assessment & Plan Note (Signed)
 New Having mostly left-sided, but somewhat diffuse back pain that is likely a combination of muscle and arthritis Was taking hydrocodone  but that was causing mild confusion so he stopped it Pain is disturbing sleep Tylenol  is not effective-continue as needed Start tramadol  50-100 mg 3 times daily as needed for pain-take with Tylenol  Advised to have his wife manage his medications-only use pain medication when needed Hopefully better controlling his pain will improve his sleep and overall function

## 2024-10-30 NOTE — Patient Instructions (Addendum)
        Medications changes include :   mirtazapine 7.5-15 mg at bedtime, tramadol  with tylenol  for pain     Update me on how the medications are working.

## 2024-10-30 NOTE — Assessment & Plan Note (Signed)
 Chronic ?  Ideally controlled or not He is having a wet sounding cough-hard to get up mucus, shortness of breath which is somewhat chronic and some wheezing Following with pulmonary They would like him to have a pulmonary test, but he is almost too weak Doing ipratropium neb treatments daily, Advair

## 2024-10-30 NOTE — Assessment & Plan Note (Signed)
 Chronic Associated with CAD, hyperlipidemia Lab Results  Component Value Date   HGBA1C 5.9 (A) 08/07/2024   HGBA1C 5.9 08/07/2024   HGBA1C 5.9 (A) 08/07/2024   HGBA1C 5.9 08/07/2024   Sugars controlled Continue Farxiga  10 mg daily, metformin  500 mg twice daily glyburide as needed for elevated sugars

## 2024-10-30 NOTE — Assessment & Plan Note (Signed)
 Chronic Not controlled Has been taking Ativan  nightly, but that is no longer effective start mirtazapine 7.5 mg nightly-hopefully this will help with his appetite, sleep probable low-grade depression because of all his current medical problems

## 2024-10-30 NOTE — Assessment & Plan Note (Signed)
 Chronic Associated with diabetes Following with cardiology Mild, nonobstructive Management per cardiology

## 2024-10-30 NOTE — Assessment & Plan Note (Signed)
 Chronic Appears euvolemic-probably dry Management  per heart failure clinic Supposed to have an MRI of his heart today, but does not know if he is able to do it-does not think he be able to sit still for the MRI that long-has tried to get in touch with cardiology unsuccessfully

## 2024-10-31 ENCOUNTER — Ambulatory Visit: Admitting: Primary Care

## 2024-10-31 ENCOUNTER — Encounter: Payer: Self-pay | Admitting: Primary Care

## 2024-10-31 ENCOUNTER — Encounter (HOSPITAL_BASED_OUTPATIENT_CLINIC_OR_DEPARTMENT_OTHER)

## 2024-10-31 DIAGNOSIS — J45909 Unspecified asthma, uncomplicated: Secondary | ICD-10-CM

## 2024-11-01 ENCOUNTER — Encounter: Payer: Self-pay | Admitting: Cardiology

## 2024-11-01 ENCOUNTER — Ambulatory Visit

## 2024-11-01 NOTE — Progress Notes (Unsigned)
 I connected with  Jeffery [name] on 11/01/24 by a {Video Enabled:26378::video and audio} enabled telemedicine application and verified that I am speaking with the correct person using two identifiers.  {Patient [phone]::Home}  {Provider [phone]::Home Office}  Persons Participating in Visit: {Persons Participating in Visit:32444}  I discussed the limitations of evaluation and management by telemedicine. The patient expressed understanding and agreed to proceed.  Vital Signs: Because this visit was a virtual/telehealth visit, some criteria may be missing or patient reported. Any vitals not documented were not able to be obtained and vitals that have been documented are patient reported.

## 2024-11-02 ENCOUNTER — Telehealth: Payer: Self-pay

## 2024-11-02 NOTE — Telephone Encounter (Signed)
 Alert received from [employer] for  AF/AFL in progress from 11/21, controlled rates, no hx of PAF noted in EPIC, no OAC.  Attempted to contact patient. No answer, left message to call back.

## 2024-11-05 NOTE — Telephone Encounter (Signed)
LM on VM and sent my chart message

## 2024-11-06 NOTE — Telephone Encounter (Signed)
 Called patient's spouse and finally got a hold of patient who states that he has been really sick the past week or so.   This RN suggested that patient get appointment scheduled with Afib clinic to discuss treatment options   Patient stated he has hardly any energy and is struggling to move around the house and stated I would be hard pressed to get over there for a visit  This RN discussed the increased risk of stroke associated with Afib while not on an [facility]  Patient stated he wanted to try an intermedicinal therapy prior to coming into clinic, but patient didn't provide any explanation as to what that included to this RN  This RN suggested sending a manual transmission to see if patient still actively in AF and patient was agreeable to this suggestion  Transmission received and reviewed and presenting EGM was c/w Afib  Gave patient's spouse phone number for AF clinic to call in morning to schedule an appointment  All questions and concerns addressed at time of call   Patient appreciative of call

## 2024-11-07 ENCOUNTER — Other Ambulatory Visit: Payer: Self-pay | Admitting: Internal Medicine

## 2024-11-07 ENCOUNTER — Telehealth (HOSPITAL_COMMUNITY): Payer: Self-pay | Admitting: *Deleted

## 2024-11-07 ENCOUNTER — Other Ambulatory Visit: Payer: Self-pay

## 2024-11-07 DIAGNOSIS — I5042 Chronic combined systolic (congestive) and diastolic (congestive) heart failure: Secondary | ICD-10-CM

## 2024-11-07 MED ORDER — HYDROCODONE-ACETAMINOPHEN 7.5-325 MG PO TABS
1.0000 | ORAL_TABLET | Freq: Four times a day (QID) | ORAL | 0 refills | Status: DC | PRN
  Filled 2024-11-07: qty 90, 23d supply, fill #0

## 2024-11-07 MED ORDER — FUROSEMIDE 40 MG PO TABS: 20.0000 mg | ORAL_TABLET | Freq: Every day | ORAL | 3 refills | Status: AC

## 2024-11-07 NOTE — Telephone Encounter (Signed)
 Called patient per Dr. Rolan with following lab results and instructions:  Creatinine higher, decrease Lasix  to 20 mg daily with BMET in 10 days.  Pt verbalized understanding of same. We will re-check labs at his Heart Failure Pharmacy appointment.   Pt reported neither he or his wife can access MyChart.

## 2024-11-07 NOTE — Telephone Encounter (Signed)
 Please see below.

## 2024-11-09 ENCOUNTER — Other Ambulatory Visit: Payer: Self-pay

## 2024-11-10 LAB — CUP PACEART INCLINIC DEVICE CHECK
Battery Remaining Longevity: 19 mo
Battery Voltage: 2.9 V
Brady Statistic RA Percent Paced: 22 %
Brady Statistic RV Percent Paced: 94 %
Date Time Interrogation Session: 20251017133900
Implantable Lead Connection Status: 753985
Implantable Lead Connection Status: 753985
Implantable Lead Connection Status: 753985
Implantable Lead Implant Date: 20190813
Implantable Lead Implant Date: 20190813
Implantable Lead Implant Date: 20190813
Implantable Lead Location: 753858
Implantable Lead Location: 753859
Implantable Lead Location: 753860
Implantable Pulse Generator Implant Date: 20190813
Lead Channel Impedance Value: 400 Ohm
Lead Channel Impedance Value: 437.5 Ohm
Lead Channel Impedance Value: 562.5 Ohm
Lead Channel Pacing Threshold Amplitude: 0.5 V
Lead Channel Pacing Threshold Amplitude: 0.5 V
Lead Channel Pacing Threshold Amplitude: 0.75 V
Lead Channel Pacing Threshold Amplitude: 1 V
Lead Channel Pacing Threshold Pulse Width: 0.5 ms
Lead Channel Pacing Threshold Pulse Width: 0.5 ms
Lead Channel Pacing Threshold Pulse Width: 0.5 ms
Lead Channel Pacing Threshold Pulse Width: 0.5 ms
Lead Channel Sensing Intrinsic Amplitude: 12 mV
Lead Channel Sensing Intrinsic Amplitude: 2.6 mV
Lead Channel Setting Pacing Amplitude: 1.75 V
Lead Channel Setting Pacing Amplitude: 2 V
Lead Channel Setting Pacing Amplitude: 2.5 V
Lead Channel Setting Pacing Pulse Width: 0.5 ms
Lead Channel Setting Pacing Pulse Width: 0.5 ms
Lead Channel Setting Sensing Sensitivity: 5 mV
Pulse Gen Model: 3562
Pulse Gen Serial Number: 9043864

## 2024-11-11 ENCOUNTER — Other Ambulatory Visit: Payer: Self-pay | Admitting: Internal Medicine

## 2024-11-12 ENCOUNTER — Other Ambulatory Visit (HOSPITAL_COMMUNITY): Payer: Self-pay

## 2024-11-12 ENCOUNTER — Emergency Department (HOSPITAL_COMMUNITY)

## 2024-11-12 ENCOUNTER — Other Ambulatory Visit: Payer: Self-pay

## 2024-11-12 ENCOUNTER — Encounter (HOSPITAL_COMMUNITY): Payer: Self-pay | Admitting: Family Medicine

## 2024-11-12 ENCOUNTER — Inpatient Hospital Stay (HOSPITAL_COMMUNITY)

## 2024-11-12 ENCOUNTER — Inpatient Hospital Stay (HOSPITAL_COMMUNITY)
Admission: EM | Admit: 2024-11-12 | Discharge: 2024-11-19 | DRG: 193 | Disposition: A | Attending: Internal Medicine | Admitting: Internal Medicine

## 2024-11-12 DIAGNOSIS — I4892 Unspecified atrial flutter: Secondary | ICD-10-CM | POA: Diagnosis present

## 2024-11-12 DIAGNOSIS — I10 Essential (primary) hypertension: Secondary | ICD-10-CM | POA: Diagnosis not present

## 2024-11-12 DIAGNOSIS — J479 Bronchiectasis, uncomplicated: Secondary | ICD-10-CM

## 2024-11-12 DIAGNOSIS — I5022 Chronic systolic (congestive) heart failure: Secondary | ICD-10-CM | POA: Diagnosis not present

## 2024-11-12 DIAGNOSIS — J153 Pneumonia due to streptococcus, group B: Secondary | ICD-10-CM | POA: Diagnosis not present

## 2024-11-12 DIAGNOSIS — R0902 Hypoxemia: Secondary | ICD-10-CM | POA: Diagnosis not present

## 2024-11-12 DIAGNOSIS — J929 Pleural plaque without asbestos: Secondary | ICD-10-CM | POA: Diagnosis not present

## 2024-11-12 DIAGNOSIS — E871 Hypo-osmolality and hyponatremia: Secondary | ICD-10-CM | POA: Diagnosis present

## 2024-11-12 DIAGNOSIS — E872 Acidosis, unspecified: Secondary | ICD-10-CM

## 2024-11-12 DIAGNOSIS — J811 Chronic pulmonary edema: Secondary | ICD-10-CM | POA: Diagnosis not present

## 2024-11-12 DIAGNOSIS — E1169 Type 2 diabetes mellitus with other specified complication: Secondary | ICD-10-CM | POA: Diagnosis present

## 2024-11-12 DIAGNOSIS — J939 Pneumothorax, unspecified: Secondary | ICD-10-CM | POA: Diagnosis not present

## 2024-11-12 DIAGNOSIS — R0602 Shortness of breath: Secondary | ICD-10-CM | POA: Diagnosis not present

## 2024-11-12 DIAGNOSIS — J869 Pyothorax without fistula: Secondary | ICD-10-CM

## 2024-11-12 DIAGNOSIS — J90 Pleural effusion, not elsewhere classified: Secondary | ICD-10-CM

## 2024-11-12 DIAGNOSIS — Z950 Presence of cardiac pacemaker: Secondary | ICD-10-CM | POA: Diagnosis not present

## 2024-11-12 DIAGNOSIS — J189 Pneumonia, unspecified organism: Secondary | ICD-10-CM | POA: Diagnosis not present

## 2024-11-12 DIAGNOSIS — J45909 Unspecified asthma, uncomplicated: Secondary | ICD-10-CM | POA: Diagnosis not present

## 2024-11-12 DIAGNOSIS — R0603 Acute respiratory distress: Secondary | ICD-10-CM | POA: Diagnosis not present

## 2024-11-12 DIAGNOSIS — R0689 Other abnormalities of breathing: Secondary | ICD-10-CM | POA: Diagnosis not present

## 2024-11-12 DIAGNOSIS — R001 Bradycardia, unspecified: Secondary | ICD-10-CM | POA: Diagnosis not present

## 2024-11-12 DIAGNOSIS — E785 Hyperlipidemia, unspecified: Secondary | ICD-10-CM | POA: Diagnosis not present

## 2024-11-12 DIAGNOSIS — I5042 Chronic combined systolic (congestive) and diastolic (congestive) heart failure: Secondary | ICD-10-CM

## 2024-11-12 DIAGNOSIS — Z4682 Encounter for fitting and adjustment of non-vascular catheter: Secondary | ICD-10-CM | POA: Diagnosis not present

## 2024-11-12 DIAGNOSIS — E1159 Type 2 diabetes mellitus with other circulatory complications: Secondary | ICD-10-CM | POA: Diagnosis not present

## 2024-11-12 DIAGNOSIS — R918 Other nonspecific abnormal finding of lung field: Secondary | ICD-10-CM | POA: Diagnosis not present

## 2024-11-12 DIAGNOSIS — J9601 Acute respiratory failure with hypoxia: Secondary | ICD-10-CM

## 2024-11-12 DIAGNOSIS — R069 Unspecified abnormalities of breathing: Secondary | ICD-10-CM | POA: Diagnosis not present

## 2024-11-12 LAB — BASIC METABOLIC PANEL WITH GFR
Anion gap: 17 — ABNORMAL HIGH (ref 5–15)
BUN: 25 mg/dL — ABNORMAL HIGH (ref 8–23)
CO2: 19 mmol/L — ABNORMAL LOW (ref 22–32)
Calcium: 8.7 mg/dL — ABNORMAL LOW (ref 8.9–10.3)
Chloride: 91 mmol/L — ABNORMAL LOW (ref 98–111)
Creatinine, Ser: 1.07 mg/dL (ref 0.61–1.24)
GFR, Estimated: >60 mL/min (ref >60)
Glucose, Bld: 155 mg/dL — ABNORMAL HIGH (ref 70–99)
Potassium: 5.4 mmol/L — ABNORMAL HIGH (ref 3.5–5.1)
Sodium: 127 mmol/L — ABNORMAL LOW (ref 135–145)

## 2024-11-12 LAB — CBC WITH DIFFERENTIAL/PLATELET
Basophils Absolute: 0 K/uL (ref 0.0–0.1)
Basophils Relative: 0 %
Eosinophils Absolute: 0 K/uL (ref 0.0–0.5)
Eosinophils Relative: 0 %
HCT: 38.9 % — ABNORMAL LOW (ref 39.0–52.0)
Hemoglobin: 12 g/dL — ABNORMAL LOW (ref 13.0–17.0)
Lymphocytes Relative: 4 %
Lymphs Abs: 1 K/uL (ref 0.7–4.0)
MCH: 25.2 pg — ABNORMAL LOW (ref 26.0–34.0)
MCHC: 30.8 g/dL (ref 30.0–36.0)
MCV: 81.7 fL (ref 80.0–100.0)
Monocytes Absolute: 0.8 K/uL (ref 0.1–1.0)
Monocytes Relative: 3 %
Neutro Abs: 24.3 K/uL — ABNORMAL HIGH (ref 1.7–7.7)
Neutrophils Relative %: 93 %
Platelets: 628 K/uL — ABNORMAL HIGH (ref 150–400)
RBC: 4.76 MIL/uL (ref 4.22–5.81)
RDW: 17.4 % — ABNORMAL HIGH (ref 11.5–15.5)
WBC: 26.1 K/uL — ABNORMAL HIGH (ref 4.0–10.5)
nRBC: 0 % (ref 0.0–0.2)

## 2024-11-12 LAB — RESP PANEL BY RT-PCR (RSV, FLU A&B, COVID)  RVPGX2
Influenza A by PCR: NEGATIVE
Influenza B by PCR: NEGATIVE
Resp Syncytial Virus by PCR: NEGATIVE
SARS Coronavirus 2 by RT PCR: NEGATIVE

## 2024-11-12 LAB — GLUCOSE, CAPILLARY
Glucose-Capillary: 110 mg/dL — ABNORMAL HIGH (ref 70–99)
Glucose-Capillary: 144 mg/dL — ABNORMAL HIGH (ref 70–99)
Glucose-Capillary: 195 mg/dL — ABNORMAL HIGH (ref 70–99)
Glucose-Capillary: 170 mg/dL — ABNORMAL HIGH (ref 70–99)

## 2024-11-12 LAB — I-STAT VENOUS BLOOD GAS, ED
Acid-Base Excess: 1 mmol/L (ref 0.0–2.0)
Bicarbonate: 23.4 mmol/L (ref 20.0–28.0)
Calcium, Ion: 0.99 mmol/L — ABNORMAL LOW (ref 1.15–1.40)
HCT: 40 % (ref 39.0–52.0)
Hemoglobin: 13.6 g/dL (ref 13.0–17.0)
O2 Saturation: 56 %
Potassium: 5.1 mmol/L (ref 3.5–5.1)
Sodium: 127 mmol/L — ABNORMAL LOW (ref 135–145)
TCO2: 24 mmol/L (ref 22–32)
pCO2, Ven: 29.9 mmHg — ABNORMAL LOW (ref 44–60)
pH, Ven: 7.502 — ABNORMAL HIGH (ref 7.25–7.43)
pO2, Ven: 26 mmHg — CL (ref 32–45)

## 2024-11-12 LAB — TROPONIN I (HIGH SENSITIVITY)
Troponin I (High Sensitivity): 99 ng/L — ABNORMAL HIGH (ref <18)
Troponin I (High Sensitivity): 116 ng/L (ref <18)

## 2024-11-12 LAB — I-STAT CG4 LACTIC ACID, ED: Lactic Acid, Venous: 3.1 mmol/L (ref 0.5–1.9)

## 2024-11-12 LAB — BRAIN NATRIURETIC PEPTIDE: B Natriuretic Peptide: 524.6 pg/mL — ABNORMAL HIGH (ref 0.0–100.0)

## 2024-11-12 LAB — LACTIC ACID, PLASMA: Lactic Acid, Venous: 2.1 mmol/L (ref 0.5–1.9)

## 2024-11-12 MED ORDER — PREDNISONE 20 MG PO TABS
50.0000 mg | ORAL_TABLET | Freq: Four times a day (QID) | ORAL | Status: DC
  Administered 2024-11-12 (×2): 50 mg via ORAL
  Filled 2024-11-12 (×2): qty 3

## 2024-11-12 MED ORDER — FENTANYL CITRATE (PF) 50 MCG/ML IJ SOSY: 12.5000 ug | PREFILLED_SYRINGE | INTRAMUSCULAR | Status: DC | PRN

## 2024-11-12 MED ORDER — DAPAGLIFLOZIN PROPANEDIOL 10 MG PO TABS
10.0000 mg | ORAL_TABLET | Freq: Every day | ORAL | Status: DC
  Administered 2024-11-13 – 2024-11-19 (×6): 10 mg via ORAL
  Filled 2024-11-12 (×7): qty 1

## 2024-11-12 MED ORDER — ROSUVASTATIN CALCIUM 5 MG PO TABS
10.0000 mg | ORAL_TABLET | ORAL | Status: DC
  Administered 2024-11-12 – 2024-11-19 (×4): 10 mg via ORAL
  Filled 2024-11-12 (×4): qty 2

## 2024-11-12 MED ORDER — METRONIDAZOLE 500 MG/100ML IV SOLN
500.0000 mg | Freq: Two times a day (BID) | INTRAVENOUS | Status: DC
  Administered 2024-11-12: 500 mg via INTRAVENOUS
  Filled 2024-11-12: qty 100

## 2024-11-12 MED ORDER — SODIUM CHLORIDE 0.9 % IV SOLN
500.0000 mg | Freq: Once | INTRAVENOUS | Status: AC
  Administered 2024-11-12: 500 mg via INTRAVENOUS
  Filled 2024-11-12: qty 5

## 2024-11-12 MED ORDER — PREDNISONE 20 MG PO TABS
40.0000 mg | ORAL_TABLET | Freq: Every day | ORAL | Status: DC
  Administered 2024-11-13: 40 mg via ORAL
  Filled 2024-11-12 (×2): qty 2

## 2024-11-12 MED ORDER — OXYCODONE HCL 5 MG PO TABS
5.0000 mg | ORAL_TABLET | ORAL | Status: DC | PRN
  Administered 2024-11-13 – 2024-11-19 (×21): 5 mg via ORAL
  Filled 2024-11-12 (×21): qty 1

## 2024-11-12 MED ORDER — ENOXAPARIN SODIUM 40 MG/0.4ML IJ SOSY
40.0000 mg | PREFILLED_SYRINGE | Freq: Every day | INTRAMUSCULAR | Status: DC
  Administered 2024-11-12 – 2024-11-13 (×2): 40 mg via SUBCUTANEOUS
  Filled 2024-11-12 (×2): qty 0.4

## 2024-11-12 MED ORDER — MORPHINE SULFATE (PF) 2 MG/ML IV SOLN
2.0000 mg | INTRAVENOUS | Status: DC | PRN
  Administered 2024-11-14 – 2024-11-16 (×2): 2 mg via INTRAVENOUS
  Filled 2024-11-12 (×2): qty 1

## 2024-11-12 MED ORDER — ASPIRIN 81 MG PO TBEC
81.0000 mg | DELAYED_RELEASE_TABLET | Freq: Every day | ORAL | Status: DC
  Administered 2024-11-12 – 2024-11-18 (×7): 81 mg via ORAL
  Filled 2024-11-12 (×7): qty 1

## 2024-11-12 MED ORDER — SODIUM CHLORIDE 0.9 % IV SOLN
1.0000 g | Freq: Once | INTRAVENOUS | Status: AC
  Administered 2024-11-12: 1 g via INTRAVENOUS
  Filled 2024-11-12: qty 10

## 2024-11-12 MED ORDER — DIPHENHYDRAMINE HCL 50 MG/ML IJ SOLN
50.0000 mg | Freq: Once | INTRAMUSCULAR | Status: AC
  Administered 2024-11-12: 50 mg via INTRAVENOUS
  Filled 2024-11-12: qty 1

## 2024-11-12 MED ORDER — INSULIN ASPART 100 UNIT/ML IJ SOLN
0.0000 [IU] | INTRAMUSCULAR | Status: DC
  Administered 2024-11-12 – 2024-11-17 (×11): 1 [IU] via SUBCUTANEOUS
  Filled 2024-11-12 (×11): qty 1

## 2024-11-12 MED ORDER — ALBUTEROL SULFATE (2.5 MG/3ML) 0.083% IN NEBU
2.5000 mg | INHALATION_SOLUTION | Freq: Four times a day (QID) | RESPIRATORY_TRACT | Status: DC | PRN
  Administered 2024-11-12 – 2024-11-17 (×2): 2.5 mg via RESPIRATORY_TRACT
  Filled 2024-11-12 (×2): qty 3

## 2024-11-12 MED ORDER — SODIUM CHLORIDE 0.9 % IV SOLN: 2.0000 g | INTRAVENOUS | Status: DC

## 2024-11-12 MED ORDER — IOHEXOL 350 MG/ML SOLN
75.0000 mL | Freq: Once | INTRAVENOUS | Status: AC | PRN
  Administered 2024-11-12: 75 mL via INTRAVENOUS

## 2024-11-12 MED ORDER — ACETAMINOPHEN 325 MG PO TABS: 650.0000 mg | ORAL_TABLET | Freq: Four times a day (QID) | ORAL | Status: DC | PRN

## 2024-11-12 MED ORDER — SPIRONOLACTONE 25 MG PO TABS: 25.0000 mg | ORAL_TABLET | Freq: Every day | ORAL | Status: DC

## 2024-11-12 MED ORDER — LORAZEPAM 0.5 MG PO TABS
0.5000 mg | ORAL_TABLET | Freq: Every evening | ORAL | Status: DC | PRN
  Administered 2024-11-13 – 2024-11-16 (×2): 1 mg via ORAL
  Administered 2024-11-16: 0.5 mg via ORAL
  Administered 2024-11-17 – 2024-11-18 (×2): 1 mg via ORAL
  Filled 2024-11-12 (×3): qty 2
  Filled 2024-11-12: qty 1
  Filled 2024-11-12: qty 2

## 2024-11-12 MED ORDER — IRBESARTAN 75 MG PO TABS: 37.5000 mg | ORAL_TABLET | Freq: Every day | ORAL | Status: DC

## 2024-11-12 MED ORDER — DIPHENHYDRAMINE HCL 25 MG PO CAPS: 50.0000 mg | ORAL_CAPSULE | Freq: Once | ORAL | Status: AC

## 2024-11-12 MED ORDER — PREDNISONE 20 MG PO TABS
50.0000 mg | ORAL_TABLET | Freq: Four times a day (QID) | ORAL | Status: AC
  Administered 2024-11-12: 50 mg via ORAL
  Filled 2024-11-12: qty 1

## 2024-11-12 MED ORDER — SODIUM CHLORIDE 0.9 % IV SOLN
3.0000 g | Freq: Four times a day (QID) | INTRAVENOUS | Status: DC
  Administered 2024-11-12 – 2024-11-19 (×28): 3 g via INTRAVENOUS
  Filled 2024-11-12 (×31): qty 8

## 2024-11-12 MED ORDER — IPRATROPIUM BROMIDE 0.02 % IN SOLN
0.5000 mg | Freq: Four times a day (QID) | RESPIRATORY_TRACT | Status: DC
  Administered 2024-11-12 – 2024-11-17 (×19): 0.5 mg via RESPIRATORY_TRACT
  Filled 2024-11-12 (×20): qty 2.5

## 2024-11-12 MED ORDER — AZITHROMYCIN 250 MG PO TABS: 500.0000 mg | ORAL_TABLET | Freq: Every day | ORAL | Status: DC

## 2024-11-12 MED ORDER — SODIUM CHLORIDE 0.9 % IV SOLN
INTRAVENOUS | Status: DC
  Administered 2024-11-12: 40 mL via INTRAVENOUS

## 2024-11-12 MED ORDER — ACETAMINOPHEN 650 MG RE SUPP: 650.0000 mg | Freq: Four times a day (QID) | RECTAL | Status: DC | PRN

## 2024-11-12 MED ORDER — PROCHLORPERAZINE EDISYLATE 10 MG/2ML IJ SOLN: 5.0000 mg | Freq: Four times a day (QID) | INTRAMUSCULAR | Status: DC | PRN

## 2024-11-12 MED ORDER — MONTELUKAST SODIUM 10 MG PO TABS
10.0000 mg | ORAL_TABLET | Freq: Every day | ORAL | Status: DC
  Administered 2024-11-12 – 2024-11-18 (×7): 10 mg via ORAL
  Filled 2024-11-12 (×7): qty 1

## 2024-11-12 MED ORDER — BISOPROLOL FUMARATE 5 MG PO TABS
2.5000 mg | ORAL_TABLET | Freq: Every day | ORAL | Status: DC
  Administered 2024-11-12 – 2024-11-19 (×8): 2.5 mg via ORAL
  Filled 2024-11-12 (×8): qty 1

## 2024-11-12 MED ORDER — LACTATED RINGERS IV BOLUS
250.0000 mL | Freq: Once | INTRAVENOUS | Status: AC
  Administered 2024-11-12: 250 mL via INTRAVENOUS

## 2024-11-12 MED ORDER — IPRATROPIUM BROMIDE 0.02 % IN SOLN
0.5000 mg | Freq: Four times a day (QID) | RESPIRATORY_TRACT | Status: DC
  Administered 2024-11-12: 0.5 mg via RESPIRATORY_TRACT
  Filled 2024-11-12: qty 2.5

## 2024-11-12 MED ORDER — FLUTICASONE FUROATE-VILANTEROL 200-25 MCG/ACT IN AEPB
1.0000 | INHALATION_SPRAY | Freq: Every day | RESPIRATORY_TRACT | Status: DC
  Administered 2024-11-12 – 2024-11-19 (×8): 1 via RESPIRATORY_TRACT
  Filled 2024-11-12: qty 28

## 2024-11-12 MED ORDER — SODIUM CHLORIDE 0.9% FLUSH
3.0000 mL | Freq: Two times a day (BID) | INTRAVENOUS | Status: DC
  Administered 2024-11-12 – 2024-11-19 (×14): 3 mL via INTRAVENOUS

## 2024-11-12 NOTE — Progress Notes (Signed)
 Patient with severe aspiration, with recommendations for NPO except meds and ice chips per speech therapy evaluation.  I spoke at length with patient, his wife and his daughter at the bedside. We talked about risk of aspiration and recurrent pneumonias, including developing severe respiratory failure in case of severe aspiration.  Options including NPO, feeding tube or po feeding with caution.  He has decided to continue feeding per mouth with caution and taking the risk of aspiration.  Continue speech therapy  He will think about further advance directives.  All questions were addressed.

## 2024-11-12 NOTE — ED Notes (Addendum)
 Ice pack applied to buttocks per patient's request. Ice chips given to patient .

## 2024-11-12 NOTE — ED Notes (Signed)
 CCMD notified that patient is on a monitor .

## 2024-11-12 NOTE — Assessment & Plan Note (Signed)
 Asthma, with possible exacerbation   Plan to continue bronchodilator therapy, inhaled and systemic corticosteroids.  Airway clearing techniques with flutter valve and incentive spirometry.

## 2024-11-12 NOTE — Hospital Course (Addendum)
 Jeffery [name] was admitted to the hospital with the working diagnosis of aspiration pneumonia, with complicated pleural effusion (left)    83 yo male with the past medical history of asthma, bronchiectasis, heart failure, T2DM, and heart block sp pacemaker who presented with cough, dyspnea and malaise.  Reported worsening symptoms over the last 4 weeks prior to admission. Continue to have severe symptoms despite use of his bronchodilators, on the day of admission his 02 saturation was 35%, along with increased work of breathing. EMS was called, he received on dose of bronchodilator neb and was transported to the ED.  On his initial physical examination his blood pressure was 105/47, HR 59, RR 20 and 02 saturation 93% on 2 L/min per [place] Lungs with coarse breath sounds with rales on the left side, heart with S1 and S2 present and regular with no gallops or rubs, abdomen with no distention and no lower extremity edema.   Chest radiograph with cardiomegaly with left upper lobe and left lower lobe patchy infiltrates.  Left lower lobe large density/ pleural effusion.  Pacemaker in place with one right atrium and one right ventricular lead.   CT chest with diffuse patchy infiltrates left upper and lower lobe.  Large heterogeneous left pleural effusions with areas of hyperdense fluid. Debris in the left mainstem bronchus and occluding the left lower lobe bronchus and multiple lingular bronchi.  Punctuate hyperdensities in the left lower lobe.   EKG 73 bpm, right axis deviation, left bundle branch block, qtc 472, ventricular paced rhythm with atrial flutter underlying rhythm, with no significant ST segment or T wave changes.   Patient was placed on antibiotic therapy, and had swallow evaluation. Positive aspiration, patient decided to continue po intake, despite aspiration risk  12/02 thoracentesis with purulent exudate.

## 2024-11-12 NOTE — Assessment & Plan Note (Addendum)
 Patient not on anticoagulation, telemetry with paced rhythm.  Continue bisoprolol  and aspirin .  Not on anticoagulation at home  Continue telemetry monitoring

## 2024-11-12 NOTE — ED Notes (Signed)
 Dr. Jerrol (EDP) explained plan of care/ admission to patient and  family at bedside .

## 2024-11-12 NOTE — Assessment & Plan Note (Addendum)
 Left upper and lower lobe pneumonia, with likely complicated left pleural effusion/ empyema.  Severe sepsis, present on admission, resolved.    Follow up wbc is trending down to 23 from 26  He has been afebrile  Cultures with no growth.  Thoracentesis 150 ml of purulent fluid, LDH >2,500, consistent with exudate. Pending cell count and culture.   Plan to continue antibiotic therapy with Unasyn, for aspiration pneumonia.  Bronchodilator therapy and systemic corticosteroids with prednisone   Consult pulmonary, patient may need a chest tube to drain empyema.   Continue bronchodilator therapy, inhaled and systemic corticosteroids. Continue aspiration precautions, swallow dysfunction due to large cervical osteophytes.   Acute hypoxemic respiratory failure  02 saturation is 94% on room air.   Out of bed tid to the chair Airways clearing technique with flutter valve and incentive spirometer.

## 2024-11-12 NOTE — Progress Notes (Addendum)
 Progress Note   Patient: Jeffery [name] FMW:[id number] DOB: 1941-10-06 DOA: 11/12/2024     0 DOS: the patient was seen and examined on 11/12/2024   Brief hospital course: Jeffery [name] was admitted to the hospital with the working diagnosis of aspiration pneumonia, with complicated pleural effusion (left)    83 yo male with the past medical history of asthma, bronchiectasis, heart failure, T2DM, and heart block sp pacemaker who presented with cough, dyspnea and malaise.  Reported worsening symptoms over the last 4 weeks prior to admission. Continue to have severe symptoms despite use of his bronchodilators, on the day of admission his 02 saturation was 35%, along with increased work of breathing. EMS was called, he received on dose of bronchodilator neb and was transported to the ED.  On his initial physical examination his blood pressure was 105/47, HR 59, RR 20 and 02 saturation 93% on 2 L/min per [place] Lungs with coarse breath sounds with rales on the left side, heart with S1 and S2 present and regular with no gallops or rubs, abdomen with no distention and no lower extremity edema.   Chest radiograph with cardiomegaly with left upper lobe and left lower lobe patchy infiltrates.  Left lower lobe large density/ pleural effusion.  Pacemaker in place with one right atrium and one right ventricular lead.   CT chest with diffuse patchy infiltrates left upper and lower lobe.  Large heterogeneous left pleural effusions with areas of hyperdense fluid. Debris in the left mainstem bronchus and occluding the left lower lobe bronchus and multiple lingular bronchi.  Punctuate hyperdensities in the left lower lobe.   EKG 73 bpm, right axis deviation, left bundle branch block, qtc 472, ventricular paced rhythm with atrial flutter underlying rhythm, with no significant ST segment or T wave changes.   Assessment and Plan: * Left lower lobe pneumonia Left upper and lower lobe pneumonia, with likely  complicated left pleural effusion.  Severe sepsis, present on admission   Plan to continue antibiotic therapy with Unasyn, for aspiration pneumonia.  Left thoracentesis.  Continue bronchodilator therapy, inhaled and systemic corticosteroids. Follow up with speech evaluation   Acute hypoxemic respiratory failure  02 saturation is 100% on 2 L/min per [place]  Out of bed tid to the chair Airways clearing technique with flutter valve and incentive spirometer.   Chronic systolic CHF (congestive heart failure) (HCC) Echocardiogram with reduced LV systolic function with Ef 25 to 30%, global hypokinesis, mild LVH, fusion of EA, RV systolic function with moderate reduction, RV moderately enlarged, RVSP 40.9 mmHg, LA and RA with mild dilatation, no significant valvular disease.   Resume spironolactone . SGLT 2 inh and irbesatan Resume bisoprolol .  Hold on furosemide    Atrial flutter (HCC) Patient not on anticoagulation, telemetry with paced rhythm.  Continue bisoprolol  and aspirin .  Not on anticoagulation at home  Continue telemetry monitoring   Cylindrical bronchiectasis (HCC) Asthma, with possible exacerbation   Plan to continue bronchodilator therapy, inhaled and systemic corticosteroids.  Airway clearing techniques with flutter valve and incentive spirometry.   Hyponatremia Follow up renal function with serum cr at 1,0 with K at 5,1 and serum bicarbonate at 19  Continue close follow up renal function and electrolytes Will resume irbesartan in am, if K stable,  Hold on spironolactone  Follow up renal function and electrolytes in am.   Type 2 diabetes mellitus with hyperlipidemia (HCC) Continue glucose cover and monitoring with insulin  sliding scale.  Continue with statin therapy  Subjective: Patient continue to have dyspnea with minimal efforts, no chest pain, no nausea or vomiting, he has bee NPO in preparation for swallow evaluation   Physical Exam: Vitals:   11/12/24  0543 11/12/24 0559 11/12/24 0745 11/12/24 1110  BP: (!) 111/53 (!) 111/53 (!) 100/52 (!) 158/113  Pulse: 60 60 60 68  Resp: 20 20 17 17   Temp: 98.7 F (37.1 C) 98.7 F (37.1 C) 98.8 F (37.1 C) 97.8 F (36.6 C)  TempSrc: Oral Oral Oral Oral  SpO2: 93%  94% 100%  Weight:  65.2 kg    Height:  5' 9 (1.753 m)     Neurology awake and alert ENT with mild pallor with no icterus Cardiovascular with S1 and S2 present and regular with no gallops, rubs or murmurs Respiratory with prolonged expiratory phase with decreased breath sounds at he left base and positive rales on the right, no wheezing or rhonchi  Abdomen with no distention No lower extremity edema    Data Reviewed:    Family Communication: I spoke with patient's wife and daughter at the bedside, we talked in detail about patient's condition, plan of care and prognosis and all questions were addressed.   Disposition: Status is: Inpatient Remains inpatient appropriate because: IV antibiotics   Planned Discharge Destination: Home    Author: Elidia Toribio Furnace, MD 11/12/2024 1:46 PM  For on call review [email or web address].

## 2024-11-12 NOTE — ED Notes (Signed)
EDP notified on elevated lactic acid result.  

## 2024-11-12 NOTE — ED Triage Notes (Signed)
 Patient arrived with EMS from home reports SOB this evening with chest congestion and productive cough , no chest pain , history of CHF , received 1 dose of DuoNeb treatment by EMS prior to arrival .

## 2024-11-12 NOTE — Assessment & Plan Note (Signed)
 Follow up renal function with serum cr at 1,1 with K at 4,8 and serum bicarbonate at 28  Na 132 and Mg 1,9

## 2024-11-12 NOTE — ED Notes (Signed)
 Admitting MD at bedside.

## 2024-11-12 NOTE — TOC CM/SW Note (Signed)
 Transition of Care [facility]) - Inpatient Brief Assessment   Patient Details  Name: STEPEHN ECKARD MRN: [id number] Date of Birth: 10/04/1941  Transition of Care [facility] CM/SW Contact:    Waddell Barnie Rama, RN Phone Number: 11/12/2024, 5:17 PM   Clinical Narrative: From home with spouse, has PCP and insurance on file, states has no HH services in place at this time ,has a cane at home.  States family member will transport them home at [employer] and family is support system, states gets medications from [facility] on [address] in [place].  Pta self ambulatory.   There are no ICM needs identified  at this time.  Please place consult for ICM needs.     Transition of Care Asessment: Insurance and Status: Insurance coverage has been reviewed Patient has primary care physician: Yes Home environment has been reviewed: home with wife Prior level of function:: indep Prior/Current Home Services: Current home services (cane) Social Drivers of Health Review: SDOH reviewed no interventions necessary Readmission risk has been reviewed: Yes Transition of care needs: no transition of care needs at this time

## 2024-11-12 NOTE — Assessment & Plan Note (Signed)
 Continue glucose cover and monitoring with insulin  sliding scale.  Fasting glucose is 182 mg/dl   Continue with statin therapy

## 2024-11-12 NOTE — H&P (Signed)
 History and Physical    DAREK EIFLER FMW:[id number] DOB: 07-19-1941 DOA: 11/12/2024  PCP: Geofm Glade PARAS, MD   Patient coming from: Home   Chief Complaint: SOB, cough, malaise   HPI: Jeffery [name] is a 83 y.o. male with medical history significant for asthma, bronchiectasis, nonischemic cardiomyopathy with EF 25 to 30%, type 2 diabetes mellitus, high-grade AV block with PPM, and recently identified atrial fibrillation on remote device check, now presenting with worsening shortness of breath, cough, and malaise.  Patient reports that he began experiencing increased cough, dyspnea, and fatigue a little over a month ago.  He has progressively worsened with continued productive cough, worsening shortness of breath, and worsening malaise.  He denies any fevers or chest pain.  He initially thought that his symptoms were due to asthma but he has not responded to breathing treatments, and has progressively worsened, and states that his pulse oximetry read as low as 35% last night.  EMS was called and administered a DuoNeb prior to arrival in the ED.  ED Course: Upon arrival to the ED, patient is found to be afebrile and saturating well on 2 L/min of supplemental oxygen with normal HR and SBP in the 90s and low 100s.  Labs are most notable for sodium 127, potassium 5.4, bicarbonate 19, anion gap 17, WBC 26,100, platelets 628,000, lactate 3.1, troponin 99, and BNP 525.  CT chest without contrast is concerning for diffuse pneumonia throughout the left lung with large heterogenous left pleural effusion and debris in the left mainstem bronchus and LLL and lingular branches.  Blood cultures were collected in the ED and the patient was given 250 mL LR, Rocephin, and azithromycin .  Review of Systems:  All other systems reviewed and apart from HPI, are negative.  Past Medical History:  Diagnosis Date   Arthritis    thumbs (07/25/2018)   Asthma    CHF (congestive heart failure) (HCC)    Chronic  bronchitis (HCC)    Environmental allergies    GERD (gastroesophageal reflux disease)    Headache    seasonal; w/environmental allergies (07/25/2018)   History of blood transfusion    when I had laminectomy (07/25/2018)   HTN (hypertension)    Hyperlipidemia    LBBB (left bundle branch block) 1999   Metatarsal bone fracture 03/07/2018   Myocardial infarction [facility])    was told I've had an old MI; probably in the 1990s (07/25/2018)   Pneumonia    as a child, age 25; viral pneumonia 3 times in the last 10 years (07/25/2018)   Presence of permanent cardiac pacemaker 07/25/2018   Seasonal allergies    Sleep apnea    wife says I do (07/25/2018)   Type II diabetes mellitus (HCC)     Past Surgical History:  Procedure Laterality Date   BACK SURGERY     BIV PACEMAKER INSERTION CRT-P  07/25/2018   BIV PACEMAKER INSERTION CRT-P N/A 07/25/2018   Procedure: BIV PACEMAKER INSERTION CRT-P;  Surgeon: Waddell Danelle ORN, MD;  Location: [facility] LAB;  Service: Cardiovascular;  Laterality: N/A;   CARDIAC CATHETERIZATION  2003   Dr Esmeralda Sharps; 85 % R circumflex obstruction   CARPAL TUNNEL RELEASE Left 10/11/2014   Procedure: LEFT CARPAL TUNNEL RELEASE;  Surgeon: Elsie Mussel, MD;  Location: [facility];  Service: Orthopedics;  Laterality: Left;   COLONOSCOPY W/ BIOPSIES AND POLYPECTOMY  2018   INGUINAL HERNIA REPAIR Right 1948   INGUINAL HERNIA REPAIR Left 1988  LUMBAR LAMINECTOMY  1984   MINOR CARPAL TUNNEL Right 11/22/2014   Procedure: RIGHT LIMITED OPEN CARPAL TUNNEL RELEASE;  Surgeon: Elsie Mussel, MD;  Location: [facility];  Service: Orthopedics;  Laterality: Right;   RIGHT/LEFT HEART CATH AND CORONARY ANGIOGRAPHY N/A 10/08/2024   Procedure: RIGHT/LEFT HEART CATH AND CORONARY ANGIOGRAPHY;  Surgeon: Rolan Ezra RAMAN, MD;  Location: [facility] INVASIVE CV LAB;  Service: Cardiovascular;  Laterality: N/A;   TONSILLECTOMY  1958    Social History:   reports that  he quit smoking about 60 years ago. His smoking use included cigarettes. He started smoking about 62 years ago. He has a 4 pack-year smoking history. He has never used smokeless tobacco. He reports current alcohol use of about 2.0 standard drinks of alcohol per week. He reports that he does not use drugs.  Allergies  Allergen Reactions   Contrast Media [Iodinated Contrast Media] Other (See Comments)    Redness and warm sensation, this reaction was noted on 02/27/13 during a Cardiac MRI per pt.  Pt sts he had erythema on his head, chest, and shoulders.  Pt had a pacemaker placed August 2019 and was pre-medicated for the dye and had no allergic reaction at that time. -Carissa Mozingo B.S. RT(R)(CT)    Family History  Problem Relation Age of Onset   Heart attack Father        58s   Colon polyps Father    Heart failure Mother        90s   Subarachnoid hemorrhage Mother 28   Hypertension Mother    Subarachnoid hemorrhage Paternal Grandfather 84   Diabetes Maternal Aunt    Colon cancer Neg Hx      Prior to Admission medications   Medication Sig Start Date End Date Taking? Authorizing Provider  albuterol  (PROVENTIL ) (2.5 MG/3ML) 0.083% nebulizer solution Take 3 mLs (2.5 mg total) by nebulization every 6 (six) hours as needed for wheezing or shortness of breath. 04/24/24  Yes Burns, Glade PARAS, MD  albuterol  (VENTOLIN  HFA) 108 (90 Base) MCG/ACT inhaler TAKE 2 PUFFS BY MOUTH EVERY 6 HOURS AS NEEDED FOR WHEEZE OR SHORTNESS OF BREATH 06/21/22  Yes Burns, Glade PARAS, MD  aspirin  EC 81 MG tablet Take 1 tablet (81 mg total) by mouth at bedtime. Swallow whole. 12/05/23   Danford, Lonni SQUIBB, MD  bisoprolol  (ZEBETA ) 5 MG tablet Take 0.5 tablets (2.5 mg total) by mouth daily. 09/12/24   Rolan Ezra RAMAN, MD  dapagliflozin  propanediol (FARXIGA ) 10 MG TABS tablet Take 1 tablet (10 mg total) by mouth daily before breakfast. 12/20/23   Court Dorn PARAS, MD  fluticasone -salmeterol (ADVAIR HFA) 230-21 MCG/ACT inhaler  Inhale 2 puffs into the lungs 2 (two) times daily. 08/07/24   Geofm Glade PARAS, MD  furosemide  (LASIX ) 40 MG tablet Take 0.5 tablets (20 mg total) by mouth daily. 11/07/24   Rolan Ezra RAMAN, MD  glipiZIDE  (GLUCOTROL ) 5 MG tablet TAKE 1-2 TABLETS (5-10 MG TOTAL) BY MOUTH 2 (TWO) TIMES DAILY BEFORE A MEAL. 05/11/24   Geofm Glade PARAS, MD  HYDROcodone -acetaminophen  (NORCO) 7.5-325 MG tablet Take 1 tablet by mouth every 6 (six) hours as needed for severe pain (pain score 7-10) (for chronic joint pain, neck pain). 11/07/24   Geofm Glade PARAS, MD  ipratropium (ATROVENT ) 0.02 % nebulizer solution TAKE 2.5 ML (0.5 MG TOTAL) BY NEBULIZATION 4 TIMES A DAY 08/31/24   Burns, Glade PARAS, MD  LORazepam  (ATIVAN ) 1 MG tablet TAKE 1/2 TO 1 TABLET BY MOUTH NIGHTLY AS  NEEDED. 05/18/24   Geofm Glade PARAS, MD  metFORMIN  (GLUCOPHAGE ) 500 MG tablet TAKE 1 TABLET BY MOUTH TWICE A DAY WITH FOOD 05/14/24   Geofm Glade PARAS, MD  mirtazapine  (REMERON ) 7.5 MG tablet Take 1-2 tablets (7.5-15 mg total) by mouth at bedtime. 10/30/24   Geofm Glade PARAS, MD  montelukast  (SINGULAIR ) 10 MG tablet Take 1 tablet (10 mg total) by mouth at bedtime. 02/29/24   Geofm Glade PARAS, MD  nitroGLYCERIN  (NITROSTAT ) 0.4 MG SL tablet Place 1 tablet (0.4 mg total) under the tongue every 5 (five) minutes x 3 doses as needed for chest pain. 03/22/23   Hobart Powell BRAVO, MD  potassium chloride  20 MEQ TBCR Take 0.5 tablets (10 mEq total) by mouth daily. 07/19/24   Court Dorn PARAS, MD  rosuvastatin  (CRESTOR ) 10 MG tablet TAKE 1 TABLET ON MONDAY, WEDNESDAY & FRIDAY. 12/20/23   Court Dorn PARAS, MD  spironolactone  (ALDACTONE ) 25 MG tablet Take 1 tablet (25 mg total) by mouth daily. 10/18/24   Rolan Ezra RAMAN, MD  valsartan  (DIOVAN ) 40 MG tablet Take 0.5 tablets (20 mg total) by mouth daily. 10/18/24   Rolan Ezra RAMAN, MD  vardenafil  (LEVITRA ) 20 MG tablet TAKE 1 TABLET BY MOUTH AS DIRECTED. CAN NOT BE TAKEN WITH NITROGLYCERIN . NOT COVERED BY INSURANCE 10/13/23   Geofm Glade PARAS, MD     Physical Exam: Vitals:   11/12/24 0415 11/12/24 0445 11/12/24 0543 11/12/24 0559  BP: (!) 105/47 (!) 106/50 (!) 111/53 (!) 111/53  Pulse: (!) 59 (!) 59 60 60  Resp: 13 11 20 20   Temp:   98.7 F (37.1 C) 98.7 F (37.1 C)  TempSrc:   Oral Oral  SpO2: 99% 98% 93%   Weight:    65.2 kg  Height:    5' 9 (1.753 m)    Constitutional: NAD, frail-appearing Eyes: PERTLA, lids and conjunctivae normal ENMT: Mucous membranes are moist. Posterior pharynx clear of any exudate or lesions.   Neck: supple, no masses  Respiratory: coarse rales on left. No wheezing. Speaking full sentences.  Cardiovascular: Regular rate. No extremity edema.  Abdomen: No tenderness, soft. Bowel sounds active.  Musculoskeletal: no clubbing / cyanosis. No joint deformity upper and lower extremities.   Skin: no significant rashes, lesions, ulcers. Warm, dry, well-perfused. Neurologic: Dysconjugate gaze. No dysarthria or aphasia. Moving all extremities. Alert and oriented to person, place, and situation.  Psychiatric: Calm. Cooperative.    Labs and Imaging on Admission: I have personally reviewed following labs and imaging studies  CBC: Recent Labs  Lab 11/12/24 0241 11/12/24 0246  WBC 26.1*  --   NEUTROABS 24.3*  --   HGB 12.0* 13.6  HCT 38.9* 40.0  MCV 81.7  --   PLT 628*  --    Basic Metabolic Panel: Recent Labs  Lab 11/12/24 0241 11/12/24 0246  NA 127* 127*  K 5.4* 5.1  CL 91*  --   CO2 19*  --   GLUCOSE 155*  --   BUN 25*  --   CREATININE 1.07  --   CALCIUM  8.7*  --    GFR: Estimated Creatinine Clearance: 49.9 mL/min (by C-G formula based on SCr of 1.07 mg/dL). Liver Function Tests: No results for input(s): AST, ALT, ALKPHOS, BILITOT, PROT, ALBUMIN in the last 168 hours. No results for input(s): LIPASE, AMYLASE in the last 168 hours. No results for input(s): AMMONIA in the last 168 hours. Coagulation Profile: No results for input(s): INR, PROTIME in the last 168  hours. Cardiac  Enzymes: No results for input(s): CKTOTAL, CKMB, CKMBINDEX, TROPONINI in the last 168 hours. BNP (last 3 results) Recent Labs    08/03/24 1010 08/20/24 0955 09/10/24 1258  PROBNP 1,036.0* 895.0* 488.0*   HbA1C: No results for input(s): HGBA1C in the last 72 hours. CBG: No results for input(s): GLUCAP in the last 168 hours. Lipid Profile: No results for input(s): CHOL, HDL, LDLCALC, TRIG, CHOLHDL, LDLDIRECT in the last 72 hours. Thyroid  Function Tests: No results for input(s): TSH, T4TOTAL, FREET4, T3FREE, THYROIDAB in the last 72 hours. Anemia Panel: No results for input(s): VITAMINB12, FOLATE, FERRITIN, TIBC, IRON, RETICCTPCT in the last 72 hours. Urine analysis:    Component Value Date/Time   COLORURINE yellow 09/06/2008 1148   APPEARANCEUR Clear 09/06/2008 1148   LABSPEC <1.005 09/06/2008 1148   PHURINE 6.0 09/06/2008 1148   HGBUR negative 09/06/2008 1148   BILIRUBINUR negative 09/06/2008 1148   UROBILINOGEN 0.2 09/06/2008 1148   NITRITE negative 09/06/2008 1148   Sepsis Labs: @LABRCNTIP (procalcitonin:4,lacticidven:4) ) Recent Results (from the past 240 hours)  Resp panel by RT-PCR (RSV, Flu A&B, Covid) Anterior Nasal Swab     Status: None   Collection Time: 11/12/24  2:41 AM   Specimen: Anterior Nasal Swab  Result Value Ref Range Status   SARS Coronavirus 2 by RT PCR NEGATIVE NEGATIVE Final   Influenza A by PCR NEGATIVE NEGATIVE Final   Influenza B by PCR NEGATIVE NEGATIVE Final    Comment: (NOTE) The Xpert Xpress SARS-CoV-2/FLU/RSV plus assay is intended as an aid in the diagnosis of influenza from Nasopharyngeal swab specimens and should not be used as a sole basis for treatment. Nasal washings and aspirates are unacceptable for Xpert Xpress SARS-CoV-2/FLU/RSV testing.  Fact Sheet for Patients: bloggercourse.com  Fact Sheet for Healthcare  Providers: seriousbroker.it  This test is not yet approved or cleared by the [place]  FDA and has been authorized for detection and/or diagnosis of SARS-CoV-2 by FDA under an Emergency Use Authorization (EUA). This EUA will remain in effect (meaning this test can be used) for the duration of the COVID-19 declaration under Section 564(b)(1) of the Act, 21 U.S.C. section 360bbb-3(b)(1), unless the authorization is terminated or revoked.     Resp Syncytial Virus by PCR NEGATIVE NEGATIVE Final    Comment: (NOTE) Fact Sheet for Patients: bloggercourse.com  Fact Sheet for Healthcare Providers: seriousbroker.it  This test is not yet approved or cleared by the [place]  FDA and has been authorized for detection and/or diagnosis of SARS-CoV-2 by FDA under an Emergency Use Authorization (EUA). This EUA will remain in effect (meaning this test can be used) for the duration of the COVID-19 declaration under Section 564(b)(1) of the Act, 21 U.S.C. section 360bbb-3(b)(1), unless the authorization is terminated or revoked.  Performed at [facility], [address] [place], [place] [address]      Radiological Exams on Admission: CT Chest Wo Contrast Result Date: 11/12/2024 EXAM: CT CHEST WITHOUT CONTRAST 11/12/2024 03:42:41 AM TECHNIQUE: CT of the chest was performed without the administration of intravenous contrast. Multiplanar reformatted images are provided for review. Automated exposure control, iterative reconstruction, and/or weight based adjustment of the mA/kV was utilized to reduce the radiation dose to as low as reasonably achievable. COMPARISON: Comparison with same day x-ray and CT 12/05/2023. CLINICAL HISTORY: Pneumonia, complication suspected, xray done. FINDINGS: MEDIASTINUM: Heart and pericardium are unremarkable. Debris in the left main stem bronchus and occluding the left lower lobe  bronchus and multiple lingular bronchi and left lower lobe.  Left chest wall pacemaker. LYMPH NODES: No mediastinal, hilar or axillary lymphadenopathy. LUNGS AND PLEURA: Large heterogeneous left pleural effusion with some hyperdense fluid which may represent exudate or clot products. Question thickening of the visceral and parietal pleura however this is not well evaluated without IV contrast . Consolidation and ground glass opacities in the left upper and lower lobes. Bronchial wall thickening throughout the left lung. Scattered centrilobular cluster nodularity and tree-in-bud opacities in the left upper lobe and left lower lobe. Indeterminate punctate hyperdensities in the left lower lobe possibly due to aspiration of hyperdense material. The right lung is clear. No pneumothorax. SOFT TISSUES/BONES: No acute abnormality of the bones or soft tissues. UPPER ABDOMEN: Limited images of the upper abdomen demonstrates no acute abnormality. IMPRESSION: 1. Diffuse pneumonia throughout the left lung. 2. Large heterogeneous left pleural effusion with areas of hyperdense fluid, which may reflect exudate or clot. Possible wall thickening about the pleural fluid which were suggest empyema though this is not well evaluated without IV contrast. 3. Debris in the left mainstem bronchus and occluding the left lower lobe bronchus and multiple lingular bronchi. 4. Punctate hyperdensities in the left lower lobe may be due to aspiration. 5. Follow up after treatment is recommended to ensure resolution of these findings. Electronically signed by: Norman Gatlin MD 11/12/2024 03:56 AM EST RP Workstation: [id number]   DG Chest Port 1 View Result Date: 11/12/2024 EXAM: 1 VIEW(S) XRAY OF THE CHEST 11/12/2024 03:14:20 AM COMPARISON: 05/04/2024 CLINICAL HISTORY: resp distress FINDINGS: LUNGS AND PLEURA: Interval development of multifocal airspace opacities in left lung consistent with multifocal pneumonia. Layering left pleural effusion. No  pneumothorax. HEART AND MEDIASTINUM: Dual lead left chest pacemaker noted. No acute abnormality of the cardiac and mediastinal silhouettes. BONES AND SOFT TISSUES: Thoracic degenerative changes. No acute osseous abnormality. IMPRESSION: 1. Pneumonia in the left lung with small right pleural effusion. Electronically signed by: Norman Gatlin MD 11/12/2024 03:17 AM EST RP Workstation: [id number]    EKG: Independently reviewed. Atrial flutter, RBBB.   Assessment/Plan   1. Pneumonia  - Non-contrast CT concerning for complicated left-sided pneumonia with possible empyema as well as debris in left mainstem bronchus and LLL and lingular branches  - Keep NPO for now, consult SLP, culture sputum, continue Rocephin, and add Flagyl; he will also likely need thoracentesis or chest tube or VATS, will get contrast-enhanced CT chest    2. Non-ischemic cardiomyopathy   - EF was 25-30% with global hypokinesis on TTE from September 2025  - Appears compensated, possibly hypovolemic   - Hold diuretics, beta-blocker, and Farxiga  given marginal BP and concern for volume depletion, monitor I/Os and daily weights    3. Asthma  - Continue ICS-LAMA, LABA, Singulair , and as-needed albuterol     4. Hyponatremia  - Serum sodium 127 - He does not appear hypervolemic  - Check urine sodium and urine osm, hold diuretics, repeat chem panel   5. Atrial fibrillation/flutter   - Atrial fibrillation noted on remote device check last month  - He is in atrial flutter and fib in ED with rate ~60  - There was mild left atrial enlargement and mild MR and TR on recent TTE  - He will likely need intervention on his suspected empyema, will hold off on anticoagulation for now, continue cardiac monitoring    6. Elevated troponin  - There was mild non-obstructive CAD on cath from 10/08/24, no chest pain, and this likely reflects demand ischemia  - Treat underlying pulmonary disease  DVT prophylaxis: Lovenox   Code Status:  DNR/DNI, confirmed on admission  Level of Care: Level of care: Progressive Family Communication: Wife and one daughter at bedside  Disposition Plan:  Patient is from: TBD Anticipated d/c is to: Home  Anticipated d/c date is: 11/17/24  Patient currently: Pending CT chest with contrast, treatment of complicated pneumonia Consults called: None  Admission status: Inpatient     Evalene GORMAN Sprinkles, MD [facility]  11/12/2024, 6:09 AM

## 2024-11-12 NOTE — Evaluation (Signed)
 Clinical/Bedside Swallow Evaluation Patient Details  Name: Jeffery [name] MRN: [id number] Date of Birth: 1941/05/25  Today's Date: 11/12/2024 Time: SLP Start Time (ACUTE ONLY): 0944 SLP Stop Time (ACUTE ONLY): 1026 SLP Time Calculation (min) (ACUTE ONLY): 42 min  Past Medical History:  Past Medical History:  Diagnosis Date   Arthritis    thumbs (07/25/2018)   Asthma    CHF (congestive heart failure) (HCC)    Chronic bronchitis (HCC)    Environmental allergies    GERD (gastroesophageal reflux disease)    Headache    seasonal; w/environmental allergies (07/25/2018)   History of blood transfusion    when I had laminectomy (07/25/2018)   HTN (hypertension)    Hyperlipidemia    LBBB (left bundle branch block) 1999   Metatarsal bone fracture 03/07/2018   Myocardial infarction [facility])    was told I've had an old MI; probably in the 1990s (07/25/2018)   Pneumonia    as a child, age 18; viral pneumonia 3 times in the last 10 years (07/25/2018)   Presence of permanent cardiac pacemaker 07/25/2018   Seasonal allergies    Sleep apnea    wife says I do (07/25/2018)   Type II diabetes mellitus (HCC)    Past Surgical History:  Past Surgical History:  Procedure Laterality Date   BACK SURGERY     BIV PACEMAKER INSERTION CRT-P  07/25/2018   BIV PACEMAKER INSERTION CRT-P N/A 07/25/2018   Procedure: BIV PACEMAKER INSERTION CRT-P;  Surgeon: Waddell Danelle ORN, MD;  Location: [facility] LAB;  Service: Cardiovascular;  Laterality: N/A;   CARDIAC CATHETERIZATION  2003   Dr Esmeralda Sharps; 85 % R circumflex obstruction   CARPAL TUNNEL RELEASE Left 10/11/2014   Procedure: LEFT CARPAL TUNNEL RELEASE;  Surgeon: Elsie Mussel, MD;  Location: [facility];  Service: Orthopedics;  Laterality: Left;   COLONOSCOPY W/ BIOPSIES AND POLYPECTOMY  2018   INGUINAL HERNIA REPAIR Right 1948   INGUINAL HERNIA REPAIR Left 1988   LUMBAR LAMINECTOMY  1984   MINOR CARPAL TUNNEL Right 11/22/2014    Procedure: RIGHT LIMITED OPEN CARPAL TUNNEL RELEASE;  Surgeon: Elsie Mussel, MD;  Location: [facility];  Service: Orthopedics;  Laterality: Right;   RIGHT/LEFT HEART CATH AND CORONARY ANGIOGRAPHY N/A 10/08/2024   Procedure: RIGHT/LEFT HEART CATH AND CORONARY ANGIOGRAPHY;  Surgeon: Rolan Ezra RAMAN, MD;  Location: [facility] LAB;  Service: Cardiovascular;  Laterality: N/A;   TONSILLECTOMY  1958   HPI:  83 yo male presenting to ED 12/1 with worsening shortness of breath, cough, and malaise. CT Chest shows diffuse pneumonia throughout the L lung, debris in the L mainstem bronchus and occluding the LLL bronchus and multiple lingual bronchi, and punctate hyperdensities in the LLL may be due to aspiration. CT 01/26/2019 recommends f/u with SLP for an MBS due to tree in bud opacities and new patchy lower lobe foci of consolidation. PMH: asthma, bronchiectasis, nonischemic cardiomyopathy with EF 25-30%, T2DM, high grade AV block with PPM, recently identified A-fib, GERD    Assessment / Plan / Recommendation  Clinical Impression  Will proceed with an MBS for further assessment given chest imaging suggestive of aspiration. Pending MBS, recommend he remain NPO except meds and ice chips in moderation.    Pt endorses baseline xerostomia, which significantly limits his intake. He cleared his throat and coughed frequently following straw sips of water  but denies this happening PTA. Mastication is mildly prolonged but he independently clears his oral cavity. Discussed history  of pneumonia and GERD as they relate to swallowing.   SLP Visit Diagnosis: Dysphagia, unspecified (R13.10)    Aspiration Risk  Mild aspiration risk    Diet Recommendation NPO except meds;Ice chips PRN after oral care    Medication Administration: Whole meds with puree    Other  Recommendations Oral Care Recommendations: Oral care QID;Oral care prior to ice chip/H20     Assistance Recommended at Discharge     Functional Status Assessment    Frequency and Duration            Prognosis Prognosis for improved oropharyngeal function: Good Barriers to Reach Goals: Time post onset      Swallow Study   General HPI: 83 yo male presenting to ED 12/1 with worsening shortness of breath, cough, and malaise. CT Chest shows diffuse pneumonia throughout the L lung, debris in the L mainstem bronchus and occluding the LLL bronchus and multiple lingual bronchi, and punctate hyperdensities in the LLL may be due to aspiration. CT 01/26/2019 recommends f/u with SLP for an MBS due to tree in bud opacities and new patchy lower lobe foci of consolidation. PMH: asthma, bronchiectasis, nonischemic cardiomyopathy with EF 25-30%, T2DM, high grade AV block with PPM, recently identified A-fib, GERD Type of Study: Bedside Swallow Evaluation Previous Swallow Assessment: none in chart Diet Prior to this Study: NPO Temperature Spikes Noted: No Respiratory Status: Nasal cannula History of Recent Intubation: No Behavior/Cognition: Alert;Cooperative Oral Cavity Assessment: Dry Oral Care Completed by SLP: No Oral Cavity - Dentition: Adequate natural dentition Vision: Functional for self-feeding Self-Feeding Abilities: Able to feed self Patient Positioning: Upright in bed Baseline Vocal Quality: Normal Volitional Cough: Strong;Congested Volitional Swallow: Able to elicit    Oral/Motor/Sensory Function Overall Oral Motor/Sensory Function: Within functional limits   Ice Chips Ice chips: Not tested   Thin Liquid Thin Liquid: Impaired Presentation: Straw;Self Fed Pharyngeal  Phase Impairments: Throat Clearing - Immediate;Cough - Immediate    Nectar Thick Nectar Thick Liquid: Not tested   Honey Thick Honey Thick Liquid: Not tested   Puree Puree: Not tested   Solid     Solid: Within functional limits Presentation: Self Fed      Damien Blumenthal, M.A., CCC-SLP Speech Language Pathology, Acute Rehabilitation  Services  Secure Chat preferred [phone]  11/12/2024,10:33 AM

## 2024-11-12 NOTE — ED Provider Notes (Addendum)
  EMERGENCY DEPARTMENT AT [facility] Provider Note   CSN: [id number] Arrival date & time: 11/12/24  9766     Patient presents with: Shortness of Breath   Jeffery [name] is a 83 y.o. male.   HPI   83 year old male with medical history significant for asthma, GERD, HTN, HLD, CHF with permanent cardiac pacemaker in place, DM2, CAD presenting to the emergency department with shortness of breath and respiratory failure.  MS was called out to the patient's house due to shortness of breath this evening.  Per family members, the patient's pulse oximetry went down to as low as 35.  He had been having a cough but thought it was related to his known asthma and had been doing breathing treatments at home.  Had had a productive cough, denied any chest pain.  He has a history of CHF.  EMS found the patient to have diminished lung sounds, no wheezing, administered DuoNeb prior to arrival.  No fevers or chills.  Prior to Admission medications   Medication Sig Start Date End Date Taking? Authorizing Provider  albuterol  (PROVENTIL ) (2.5 MG/3ML) 0.083% nebulizer solution Take 3 mLs (2.5 mg total) by nebulization every 6 (six) hours as needed for wheezing or shortness of breath. 04/24/24   Geofm Glade PARAS, MD  albuterol  (VENTOLIN  HFA) 108 (90 Base) MCG/ACT inhaler TAKE 2 PUFFS BY MOUTH EVERY 6 HOURS AS NEEDED FOR WHEEZE OR SHORTNESS OF BREATH 06/21/22   Geofm Glade PARAS, MD  aspirin  EC 81 MG tablet Take 1 tablet (81 mg total) by mouth at bedtime. Swallow whole. 12/05/23   Danford, Lonni SQUIBB, MD  bisoprolol  (ZEBETA ) 5 MG tablet Take 0.5 tablets (2.5 mg total) by mouth daily. 09/12/24   Rolan Ezra RAMAN, MD  Continuous Blood Gluc Receiver (FREESTYLE LIBRE READER) DEVI 1 Device by Does not apply route every 14 (fourteen) days. Freestyle libre 3 reader 10/09/21   Geofm Glade PARAS, MD  Continuous Glucose Sensor (FREESTYLE LIBRE 14 DAY SENSOR) MISC APPLY 1 SENSOR EVERY 14 DAYS 05/08/24   Geofm Glade PARAS, MD  dapagliflozin  propanediol (FARXIGA ) 10 MG TABS tablet Take 1 tablet (10 mg total) by mouth daily before breakfast. 12/20/23   Court Dorn PARAS, MD  fluticasone -salmeterol (ADVAIR HFA) 230-21 MCG/ACT inhaler Inhale 2 puffs into the lungs 2 (two) times daily. 08/07/24   Geofm Glade PARAS, MD  furosemide  (LASIX ) 40 MG tablet Take 0.5 tablets (20 mg total) by mouth daily. 11/07/24   Rolan Ezra RAMAN, MD  glipiZIDE  (GLUCOTROL ) 5 MG tablet TAKE 1-2 TABLETS (5-10 MG TOTAL) BY MOUTH 2 (TWO) TIMES DAILY BEFORE A MEAL. 05/11/24   Geofm Glade PARAS, MD  HYDROcodone -acetaminophen  (NORCO) 7.5-325 MG tablet Take 1 tablet by mouth every 6 (six) hours as needed for severe pain (pain score 7-10) (for chronic joint pain, neck pain). 11/07/24   Geofm Glade PARAS, MD  ipratropium (ATROVENT ) 0.02 % nebulizer solution TAKE 2.5 ML (0.5 MG TOTAL) BY NEBULIZATION 4 TIMES A DAY 08/31/24   Burns, Glade PARAS, MD  LORazepam  (ATIVAN ) 1 MG tablet TAKE 1/2 TO 1 TABLET BY MOUTH NIGHTLY AS NEEDED. 05/18/24   Burns, Glade PARAS, MD  metFORMIN  (GLUCOPHAGE ) 500 MG tablet TAKE 1 TABLET BY MOUTH TWICE A DAY WITH FOOD 05/14/24   Geofm Glade PARAS, MD  mirtazapine  (REMERON ) 7.5 MG tablet Take 1-2 tablets (7.5-15 mg total) by mouth at bedtime. 10/30/24   Geofm Glade PARAS, MD  montelukast  (SINGULAIR ) 10 MG tablet Take 1 tablet (10 mg total)  by mouth at bedtime. 02/29/24   Geofm Glade PARAS, MD  nitroGLYCERIN  (NITROSTAT ) 0.4 MG SL tablet Place 1 tablet (0.4 mg total) under the tongue every 5 (five) minutes x 3 doses as needed for chest pain. 03/22/23   Hobart Powell BRAVO, MD  potassium chloride  20 MEQ TBCR Take 0.5 tablets (10 mEq total) by mouth daily. 07/19/24   Court Dorn PARAS, MD  Respiratory Therapy Supplies (FLUTTER) DEVI Twice a day and prn as needed, may increase if feeling worse 01/16/21   Tonna Redell SQUIBB, NP  rosuvastatin  (CRESTOR ) 10 MG tablet TAKE 1 TABLET ON MONDAY, WEDNESDAY & FRIDAY. 12/20/23   Court Dorn PARAS, MD  spironolactone  (ALDACTONE ) 25 MG  tablet Take 1 tablet (25 mg total) by mouth daily. 10/18/24   Rolan Ezra RAMAN, MD  valsartan  (DIOVAN ) 40 MG tablet Take 0.5 tablets (20 mg total) by mouth daily. 10/18/24   Rolan Ezra RAMAN, MD  vardenafil  (LEVITRA ) 20 MG tablet TAKE 1 TABLET BY MOUTH AS DIRECTED. CAN NOT BE TAKEN WITH NITROGLYCERIN . NOT COVERED BY INSURANCE 10/13/23   Geofm Glade PARAS, MD    Allergies: Contrast media [iodinated contrast media]    Review of Systems  All other systems reviewed and are negative.   Updated Vital Signs BP (!) 110/49   Pulse (!) 59   Temp (!) 97 F (36.1 C) (Temporal)   Resp 14   SpO2 98%   Physical Exam Vitals and nursing note reviewed.  Constitutional:      General: He is not in acute distress.    Appearance: He is well-developed.  HENT:     Head: Normocephalic and atraumatic.  Eyes:     Conjunctiva/sclera: Conjunctivae normal.  Cardiovascular:     Rate and Rhythm: Normal rate and regular rhythm.     Heart sounds: No murmur heard. Pulmonary:     Effort: Pulmonary effort is normal. No respiratory distress.     Breath sounds: Examination of the left-upper field reveals decreased breath sounds. Examination of the left-middle field reveals decreased breath sounds. Examination of the right-lower field reveals rales. Examination of the left-lower field reveals decreased breath sounds. Decreased breath sounds and rales present. No wheezing.  Abdominal:     Palpations: Abdomen is soft.     Tenderness: There is no abdominal tenderness.  Musculoskeletal:        General: No swelling.     Cervical back: Neck supple.  Skin:    General: Skin is warm and dry.     Capillary Refill: Capillary refill takes less than 2 seconds.  Neurological:     Mental Status: He is alert.  Psychiatric:        Mood and Affect: Mood normal.     (all labs ordered are listed, but only abnormal results are displayed) Labs Reviewed  BASIC METABOLIC PANEL WITH GFR - Abnormal; Notable for the following  components:      Result Value   Sodium 127 (*)    Potassium 5.4 (*)    Chloride 91 (*)    CO2 19 (*)    Glucose, Bld 155 (*)    BUN 25 (*)    Calcium  8.7 (*)    Anion gap 17 (*)    All other components within normal limits  CBC WITH DIFFERENTIAL/PLATELET - Abnormal; Notable for the following components:   WBC 26.1 (*)    Hemoglobin 12.0 (*)    HCT 38.9 (*)    MCH 25.2 (*)    RDW 17.4 (*)  Platelets 628 (*)    Neutro Abs 24.3 (*)    All other components within normal limits  BRAIN NATRIURETIC PEPTIDE - Abnormal; Notable for the following components:   B Natriuretic Peptide 524.6 (*)    All other components within normal limits  I-STAT VENOUS BLOOD GAS, ED - Abnormal; Notable for the following components:   pH, Ven 7.502 (*)    pCO2, Ven 29.9 (*)    pO2, Ven 26 (*)    Sodium 127 (*)    Calcium , Ion 0.99 (*)    All other components within normal limits  I-STAT CG4 LACTIC ACID, ED - Abnormal; Notable for the following components:   Lactic Acid, Venous 3.1 (*)    All other components within normal limits  TROPONIN I (HIGH SENSITIVITY) - Abnormal; Notable for the following components:   Troponin I (High Sensitivity) 99 (*)    All other components within normal limits  RESP PANEL BY RT-PCR (RSV, FLU A&B, COVID)  RVPGX2  CULTURE, BLOOD (ROUTINE X 2)  CULTURE, BLOOD (ROUTINE X 2)  TROPONIN I (HIGH SENSITIVITY)    EKG: None  Radiology: CT Chest Wo Contrast Result Date: 11/12/2024 EXAM: CT CHEST WITHOUT CONTRAST 11/12/2024 03:42:41 AM TECHNIQUE: CT of the chest was performed without the administration of intravenous contrast. Multiplanar reformatted images are provided for review. Automated exposure control, iterative reconstruction, and/or weight based adjustment of the mA/kV was utilized to reduce the radiation dose to as low as reasonably achievable. COMPARISON: Comparison with same day x-ray and CT 12/05/2023. CLINICAL HISTORY: Pneumonia, complication suspected, xray done.  FINDINGS: MEDIASTINUM: Heart and pericardium are unremarkable. Debris in the left main stem bronchus and occluding the left lower lobe bronchus and multiple lingular bronchi and left lower lobe. Left chest wall pacemaker. LYMPH NODES: No mediastinal, hilar or axillary lymphadenopathy. LUNGS AND PLEURA: Large heterogeneous left pleural effusion with some hyperdense fluid which may represent exudate or clot products. Question thickening of the visceral and parietal pleura however this is not well evaluated without IV contrast . Consolidation and ground glass opacities in the left upper and lower lobes. Bronchial wall thickening throughout the left lung. Scattered centrilobular cluster nodularity and tree-in-bud opacities in the left upper lobe and left lower lobe. Indeterminate punctate hyperdensities in the left lower lobe possibly due to aspiration of hyperdense material. The right lung is clear. No pneumothorax. SOFT TISSUES/BONES: No acute abnormality of the bones or soft tissues. UPPER ABDOMEN: Limited images of the upper abdomen demonstrates no acute abnormality. IMPRESSION: 1. Diffuse pneumonia throughout the left lung. 2. Large heterogeneous left pleural effusion with areas of hyperdense fluid, which may reflect exudate or clot. Possible wall thickening about the pleural fluid which were suggest empyema though this is not well evaluated without IV contrast. 3. Debris in the left mainstem bronchus and occluding the left lower lobe bronchus and multiple lingular bronchi. 4. Punctate hyperdensities in the left lower lobe may be due to aspiration. 5. Follow up after treatment is recommended to ensure resolution of these findings. Electronically signed by: Norman Gatlin MD 11/12/2024 03:56 AM EST RP Workstation: [id number]   DG Chest Port 1 View Result Date: 11/12/2024 EXAM: 1 VIEW(S) XRAY OF THE CHEST 11/12/2024 03:14:20 AM COMPARISON: 05/04/2024 CLINICAL HISTORY: resp distress FINDINGS: LUNGS AND PLEURA:  Interval development of multifocal airspace opacities in left lung consistent with multifocal pneumonia. Layering left pleural effusion. No pneumothorax. HEART AND MEDIASTINUM: Dual lead left chest pacemaker noted. No acute abnormality of the cardiac and mediastinal silhouettes. BONES  AND SOFT TISSUES: Thoracic degenerative changes. No acute osseous abnormality. IMPRESSION: 1. Pneumonia in the left lung with small right pleural effusion. Electronically signed by: Norman Gatlin MD 11/12/2024 03:17 AM EST RP Workstation: [id number]     .Critical Care  Performed by: Jerrol Agent, MD Authorized by: Jerrol Agent, MD   Critical care provider statement:    Critical care time (minutes):  30   Critical care was necessary to treat or prevent imminent or life-threatening deterioration of the following conditions:  Respiratory failure   Critical care was time spent personally by me on the following activities:  Development of treatment plan with patient or surrogate, discussions with consultants, evaluation of patient's response to treatment, examination of patient, ordering and review of laboratory studies, ordering and review of radiographic studies, ordering and performing treatments and interventions, pulse oximetry, re-evaluation of patient's condition and review of old charts    Medications Ordered in the ED  cefTRIAXone (ROCEPHIN) 1 g in sodium chloride  0.9 % 100 mL IVPB (0 g Intravenous Stopped 11/12/24 0343)  azithromycin  (ZITHROMAX ) 500 mg in sodium chloride  0.9 % 250 mL IVPB (0 mg Intravenous Stopped 11/12/24 0422)  lactated ringers  bolus 250 mL (0 mLs Intravenous Stopped 11/12/24 0402)    Clinical Course as of 11/12/24 0425  Mon Nov 12, 2024  0301 WBC(!): 26.1 [JL]  0305 pH, Ven(!): 7.502 [JL]  0305 pCO2, Ven(!): 29.9 [JL]  0406 Lactic Acid, Venous(!!): 3.1 [JL]    Clinical Course User Index [JL] Jerrol Agent, MD                                 Medical Decision Making Amount  and/or Complexity of Data Reviewed Labs: ordered. Decision-making details documented in ED Course. Radiology: ordered.  Risk Decision regarding hospitalization.     83 year old male with medical history significant for asthma, GERD, HTN, HLD, CHF with permanent cardiac pacemaker in place, DM2, CAD presenting to the emergency department with shortness of breath and respiratory failure.  MS was called out to the patient's house due to shortness of breath this evening.  Per family members, the patient's pulse oximetry went down to as low as 35.  He had been having a cough but thought it was related to his known asthma and had been doing breathing treatments at home.  Had had a productive cough, denied any chest pain.  He has a history of CHF.  EMS found the patient to have diminished lung sounds, no wheezing, administered DuoNeb prior to arrival.  No fevers or chills.  On arrival, the patient was afebrile, temperature 7F, not tachycardic heart rate 61, paced rhythm seen on cardiac telemetry, BP 120/41, respiratory rate 16, saturating at 100% on a nebulizer, subsequently de-escalated to O2 via nasal cannula.  Patient presenting with diminished breath sounds on exam, concern for pneumonia, pneumothorax, pleural effusion, viral infection considered, CHF exacerbation considered.  Not wheezing on exam.  EKG: Paced rhythm, atrial flutter noted underlying, rate controlled, no STEMI.  Chest x-ray: IMPRESSION:  1. Pneumonia in the left lung with small right pleural effusion.    Labs: CBC with a leukocytosis to 26.1 with a neutrophilia and left shift, mild anemia to 12.0, BMP with hyponatremia to 127, hyperkalemia to 5.4, anion gap acidosis with a bicarbonate of 19, anion gap of 17, hyperglycemia to 155 noted, serum creatinine 1.07.  A BNP was moderately elevated to 525 and an initial cardiac troponin was 99  in the setting of the patient's known CHF.  A VBG revealed an alkalosis that is respiratory with a  pCO2 of 29.9 and a pH of 7.5.  The patient maintain oxygen saturations on nasal cannula.  In the setting of concern for pneumonia he was started on Rocephin and azithromycin .  Given his known CHF, only gentle rehydration with a 250 cc fluid bolus was administered.  Cultures obtained.  Lactic acid elevated at 3.1.  COVID flu and PCR testing pending.  Repeat troponin and lactic acid pending.  The patient was mildly confused in the setting of his hypoxia and did not feel that the patient could personally consent to have a CODE STATUS discussion.  I discussed the care of the patient with his wife who was bedside.  She states that he would not want intubation or CPR.  The patient did confirm this bedside however ultimately deferred to his wife for full goals of care discussion.  The patient's CODE STATUS was subsequently changed to DNR limited.  CT imaging of the chest was ordered to further delineate the patient's respiratory failure based on initial findings of portable chest.  Patient without chest pain, low concern for PE at this time, CT chest without contrast ordered.  CT chest WO:  IMPRESSION:  1. Diffuse pneumonia throughout the left lung.  2. Large heterogeneous left pleural effusion with areas of hyperdense fluid,  which may reflect exudate or clot. Possible wall thickening about the pleural  fluid which were suggest empyema though this is not well evaluated without IV  contrast.  3. Debris in the left mainstem bronchus and occluding the left lower lobe  bronchus and multiple lingular bronchi.  4. Punctate hyperdensities in the left lower lobe may be due to aspiration.  5. Follow up after treatment is recommended to ensure resolution of these  findings.    Patient maintain oxygen saturations on nasal cannula.  Explained results of diagnostic testing with the patient and family bedside.  Patient with brief drop in blood pressure, improved with 250 cc IV fluid bolus, bedside point-of-care  ultrasound revealed euvolemic appearing IVC, dilated but will collapse with inspiration.  Remained hemodynamically stable, hospitalist medicine consulted for admission, Dr. Charlton accepting.     Final diagnoses:  Community acquired pneumonia of left lower lobe of lung  Acute respiratory failure with hypoxia (HCC)  Lactic acidosis  Pleural effusion    ED Discharge Orders     None          Jerrol Agent, MD 11/12/24 9566    Jerrol Agent, MD 11/12/24 [phone]

## 2024-11-12 NOTE — Assessment & Plan Note (Signed)
 Echocardiogram with reduced LV systolic function with Ef 25 to 30%, global hypokinesis, mild LVH, fusion of EA, RV systolic function with moderate reduction, RV moderately enlarged, RVSP 40.9 mmHg, LA and RA with mild dilatation, no significant valvular disease.   Resume spironolactone . SGLT 2 inh and irbesatan Resume bisoprolol .  Hold on furosemide 

## 2024-11-12 NOTE — Progress Notes (Incomplete)
 Date and time results received: 11/12/24 0800  Test: lactic acid  Critical Value: 2.1  Name of Provider Notified: Dr. Noralee  Orders Received? Or Actions Taken?: ***

## 2024-11-12 NOTE — Progress Notes (Signed)
 Modified Barium Swallow Study  Patient Details  Name: Jeffery [name] MRN: [id number] Date of Birth: 09/15/1941  Today's Date: 11/12/2024  Modified Barium Swallow completed.  Full report located under Chart Review in the Imaging Section.  History of Present Illness 83 yo male presenting to ED 12/1 with worsening shortness of breath, cough, and malaise. CT Chest shows diffuse pneumonia throughout the L lung, debris in the L mainstem bronchus and occluding the LLL bronchus and multiple lingual bronchi, and punctate hyperdensities in the LLL may be due to aspiration. CT 01/26/2019 recommends f/u with SLP for an MBS due to tree in bud opacities and new patchy lower lobe foci of consolidation. CT Cervical Spine 2021 shows advanced multilevel degenerative disc disease and facet joint arthropathy with at least moderate canal stenosis most severely involving C5-6 and C6-7 and prominent anterior endplate osteophytes. PMH: asthma, bronchiectasis, nonischemic cardiomyopathy with EF 25-30%, T2DM, high grade AV block with PPM, recently identified A-fib, GERD   Clinical Impression Pt presents with severe pharyngeal dysphagia secondary to significantly impaired efficiency. Cervical osteophytes (confirmed by CT 2021) affect epiglottic inversion, leading to nasopharyngeal escape, minimal pharyngeal clearance, and reduced laryngeal closure during the swallow. These factors contribute to sensed aspiration of thin liquids during and after the swallow (PAS 7) and frank penetration of nectar thick liquids (PAS 5). Larger volumes and thicker consistencies do improve epiglottic inversion but increase the amount of pharyngeal residue. While honey thick liquids, purees, and solids did not yield airway invasion, the majority of each bolus remained throughout the pharynx and did not clear with multiple swallows, increasing the potential for post-prandial penetration/aspiration. A chin tuck posture and bilateral head turns did not  consistently improve epiglottic inversion. Suspect this represents chronic dysphagia given reports of significant weight loss and prior pneumonia. Findings may also represent increased likelihood of inadequate nutrition/hydration given minimal pharyngeal clearance. For now, recommend he remain NPO except ice chips and meds crushed in puree. Visited with pt and his family after the MBS to discuss results and provide education. Encourage ongoing discussion with MD and PMT to determine goals related to eating/drinking. Will continue following.   Factors that may increase risk of adverse event in presence of aspiration Noe & Lianne 2021): Poor general health and/or compromised immunity;Frail or deconditioned;Reduced saliva  Swallow Evaluation Recommendations Recommendations: NPO except meds;Ice chips PRN after oral care Medication Administration: Crushed with puree Supervision: Patient able to self-feed;Full supervision/cueing for swallowing strategies Swallowing strategies  : Minimize environmental distractions;Slow rate;Small bites/sips;Multiple dry swallows after each bite/sip Postural changes: Position pt fully upright for meals;Stay upright 30-60 min after meals Oral care recommendations: Oral care BID (2x/day);Oral care before ice chips/water  Recommended consults: Consider Palliative care   Damien Blumenthal, M.A., CCC-SLP Speech Language Pathology, Acute Rehabilitation Services  Secure Chat preferred [phone]  11/12/2024,4:06 PM

## 2024-11-13 ENCOUNTER — Inpatient Hospital Stay (HOSPITAL_COMMUNITY)

## 2024-11-13 ENCOUNTER — Telehealth (HOSPITAL_COMMUNITY): Payer: Self-pay | Admitting: Pharmacy Technician

## 2024-11-13 ENCOUNTER — Other Ambulatory Visit (HOSPITAL_COMMUNITY): Payer: Self-pay

## 2024-11-13 DIAGNOSIS — I5022 Chronic systolic (congestive) heart failure: Secondary | ICD-10-CM | POA: Diagnosis not present

## 2024-11-13 DIAGNOSIS — I4892 Unspecified atrial flutter: Secondary | ICD-10-CM | POA: Diagnosis not present

## 2024-11-13 DIAGNOSIS — J479 Bronchiectasis, uncomplicated: Secondary | ICD-10-CM | POA: Diagnosis not present

## 2024-11-13 DIAGNOSIS — J189 Pneumonia, unspecified organism: Secondary | ICD-10-CM | POA: Diagnosis not present

## 2024-11-13 HISTORY — PX: IR THORACENTESIS ASP PLEURAL SPACE W/IMG GUIDE: IMG5380

## 2024-11-13 LAB — GLUCOSE, CAPILLARY
Glucose-Capillary: 161 mg/dL — ABNORMAL HIGH (ref 70–99)
Glucose-Capillary: 178 mg/dL — ABNORMAL HIGH (ref 70–99)
Glucose-Capillary: 165 mg/dL — ABNORMAL HIGH (ref 70–99)
Glucose-Capillary: 175 mg/dL — ABNORMAL HIGH (ref 70–99)
Glucose-Capillary: 140 mg/dL — ABNORMAL HIGH (ref 70–99)
Glucose-Capillary: 159 mg/dL — ABNORMAL HIGH (ref 70–99)

## 2024-11-13 LAB — CBC
HCT: 32.2 % — ABNORMAL LOW (ref 39.0–52.0)
Hemoglobin: 10.3 g/dL — ABNORMAL LOW (ref 13.0–17.0)
MCH: 25.3 pg — ABNORMAL LOW (ref 26.0–34.0)
MCHC: 32 g/dL (ref 30.0–36.0)
MCV: 79.1 fL — ABNORMAL LOW (ref 80.0–100.0)
Platelets: 445 K/uL — ABNORMAL HIGH (ref 150–400)
RBC: 4.07 MIL/uL — ABNORMAL LOW (ref 4.22–5.81)
RDW: 17 % — ABNORMAL HIGH (ref 11.5–15.5)
WBC: 23.3 K/uL — ABNORMAL HIGH (ref 4.0–10.5)
nRBC: 0 % (ref 0.0–0.2)

## 2024-11-13 LAB — PROTEIN, PLEURAL OR PERITONEAL FLUID: Total protein, fluid: <3 g/dL

## 2024-11-13 LAB — COMPREHENSIVE METABOLIC PANEL WITH GFR
ALT: 48 U/L — ABNORMAL HIGH (ref 0–44)
AST: 39 U/L (ref 15–41)
Albumin: 1.7 g/dL — ABNORMAL LOW (ref 3.5–5.0)
Alkaline Phosphatase: 111 U/L (ref 38–126)
Anion gap: 10 (ref 5–15)
BUN: 26 mg/dL — ABNORMAL HIGH (ref 8–23)
CO2: 28 mmol/L (ref 22–32)
Calcium: 8.5 mg/dL — ABNORMAL LOW (ref 8.9–10.3)
Chloride: 94 mmol/L — ABNORMAL LOW (ref 98–111)
Creatinine, Ser: 1.16 mg/dL (ref 0.61–1.24)
GFR, Estimated: >60 mL/min (ref >60)
Glucose, Bld: 182 mg/dL — ABNORMAL HIGH (ref 70–99)
Potassium: 4.8 mmol/L (ref 3.5–5.1)
Sodium: 132 mmol/L — ABNORMAL LOW (ref 135–145)
Total Bilirubin: 0.5 mg/dL (ref 0.0–1.2)
Total Protein: 5.9 g/dL — ABNORMAL LOW (ref 6.5–8.1)

## 2024-11-13 LAB — BODY FLUID CELL COUNT WITH DIFFERENTIAL
Other Cells, Fluid: UNDETERMINED %
Total Nucleated Cell Count, Fluid: 1970000 uL — ABNORMAL HIGH (ref 0–1000)

## 2024-11-13 LAB — GLUCOSE, PLEURAL OR PERITONEAL FLUID: Glucose, Fluid: 26 mg/dL

## 2024-11-13 LAB — LACTATE DEHYDROGENASE, PLEURAL OR PERITONEAL FLUID: LD, Fluid: >2500 U/L — ABNORMAL HIGH (ref 3–23)

## 2024-11-13 LAB — MAGNESIUM: Magnesium: 1.9 mg/dL (ref 1.7–2.4)

## 2024-11-13 LAB — LACTATE DEHYDROGENASE: LDH: 128 U/L (ref 105–235)

## 2024-11-13 MED ORDER — MAGIC MOUTHWASH: 5.0000 mL | Freq: Three times a day (TID) | ORAL | Status: DC | PRN

## 2024-11-13 MED ORDER — GUAIFENESIN 100 MG/5ML PO LIQD
10.0000 mL | Freq: Three times a day (TID) | ORAL | Status: DC
  Administered 2024-11-13 – 2024-11-19 (×18): 10 mL via ORAL
  Filled 2024-11-13 (×18): qty 10

## 2024-11-13 MED ORDER — SODIUM CHLORIDE 0.9 % IV SOLN
100.0000 mg | Freq: Two times a day (BID) | INTRAVENOUS | Status: DC
  Administered 2024-11-13 – 2024-11-16 (×6): 100 mg via INTRAVENOUS
  Filled 2024-11-13 (×9): qty 100

## 2024-11-13 MED ORDER — LIDOCAINE-EPINEPHRINE 1 %-1:100000 IJ SOLN
INTRAMUSCULAR | Status: AC
  Filled 2024-11-13: qty 1

## 2024-11-13 MED ORDER — LIDOCAINE-EPINEPHRINE 1 %-1:100000 IJ SOLN
20.0000 mL | Freq: Once | INTRAMUSCULAR | Status: AC
  Administered 2024-11-13: 20 mL
  Filled 2024-11-13: qty 20

## 2024-11-13 NOTE — Progress Notes (Signed)
 Speech Language Pathology Treatment: Dysphagia  Patient Details Name: Jeffery [name] MRN: [id number] DOB: 1941/04/14 Today's Date: 11/13/2024 Time: 8898-8873 SLP Time Calculation (min) (ACUTE ONLY): 25 min  Assessment / Plan / Recommendation Clinical Impression  Pt seen s/p thoracentesis, complaining of generalized pain and decreased appetite. Per discussion with MD, pt has decided to accept risk and continue eating/drinking a regular diet. Provided education regarding the things that can be controlled related to his dysphagia including thorough oral care and EMST. He independently recalled these recommendations from discussion yesterday and is implementing them independently. Introduced EMST and pt completed 20 reps set to 15 cm/H2O. All questions from pt and his daughter answered within scope of SLP. Continue current diet with oral care BID in addition to before meals. Use EMST 3x/day. SLP will continue following.    HPI HPI: 83 yo male presenting to ED 12/1 with worsening shortness of breath, cough, and malaise. CT Chest shows diffuse pneumonia throughout the L lung, debris in the L mainstem bronchus and occluding the LLL bronchus and multiple lingual bronchi, and punctate hyperdensities in the LLL may be due to aspiration. CT 01/26/2019 recommends f/u with SLP for an MBS due to tree in bud opacities and new patchy lower lobe foci of consolidation. CT Cervical Spine 2021 shows advanced multilevel degenerative disc disease and facet joint arthropathy with at least moderate canal stenosis most severely involving C5-6 and C6-7 and prominent anterior endplate osteophytes. PMH: asthma, bronchiectasis, nonischemic cardiomyopathy with EF 25-30%, T2DM, high grade AV block with PPM, recently identified A-fib, GERD      SLP Plan  Continue with current plan of care        Swallow Evaluation Recommendations   Recommendations: PO diet PO Diet Recommendation: Regular;Thin liquids (Level 0) Liquid  Administration via: Cup;Straw Medication Administration: Whole meds with puree Supervision: Patient able to self-feed;Intermittent supervision/cueing for swallowing strategies Postural changes: Position pt fully upright for meals;Stay upright 30-60 min after meals Oral care recommendations: Oral care BID (2x/day);Oral care before PO Recommended consults: Consider Palliative care     Recommendations                     Oral care BID;Oral care before and after PO   Frequent or constant Supervision/Assistance Dysphagia, pharyngeal phase (R13.13)     Continue with current plan of care     Damien Blumenthal, M.A., CCC-SLP Speech Language Pathology, Acute Rehabilitation Services  Secure Chat preferred [phone]   11/13/2024, 11:42 AM

## 2024-11-13 NOTE — Consult Note (Signed)
 NAME:  Jeffery [name], MRN:  [id number], DOB:  Jan 30, 1941, LOS: 1 ADMISSION DATE:  11/12/2024, CONSULTATION DATE:  12/2 REFERRING MD:  Dr. Fujisawa, CHIEF COMPLAINT:  pleural effusion   History of Present Illness:  Patient is a 83 yo M w/ pertinent PMH bronchiectasis, asthma, T2DM, type 2 2nd degree heart block s/p pacemaker placement, systolic chf, GERD presents to [facility] on 12/1 w/ pleural effusion.  Worsening dyspnea and cough over the past month. On 12/1 increased wob. EMS called found hypoxic and placed on supplemental o2 and given nebs. Taken to [facility] ED. On arrival sats 93% on 2 l/m [place]. Noted to have rales on left side. CXR showing LUL and LLL infiltrate. CT chest showing diffuse patchy infiltrates LUL and LLL; large left loculated pleural effusion concerning for empyema or parapneumonic effusion. Afebrile and wbc 26. Cultures obtained and started on broad spectrum abx. Swallow eval performed showing severe aspiration. Abx changed to unasyn for possible aspiration pna. On 12/2 IR performed thora with only 150 green tinged purulent fluids. Pleural LDH >2,500. TNC count 1,970,000. PCCM consulted.  Pertinent  Medical History   Past Medical History:  Diagnosis Date   Arthritis    thumbs (07/25/2018)   Asthma    CHF (congestive heart failure) (HCC)    Chronic bronchitis (HCC)    Environmental allergies    GERD (gastroesophageal reflux disease)    Headache    seasonal; w/environmental allergies (07/25/2018)   History of blood transfusion    when I had laminectomy (07/25/2018)   HTN (hypertension)    Hyperlipidemia    LBBB (left bundle branch block) 1999   Metatarsal bone fracture 03/07/2018   Myocardial infarction [facility])    was told I've had an old MI; probably in the 1990s (07/25/2018)   Pneumonia    as a child, age 67; viral pneumonia 3 times in the last 10 years (07/25/2018)   Presence of permanent cardiac pacemaker 07/25/2018   Seasonal allergies    Sleep apnea    wife  says I do (07/25/2018)   Type II diabetes mellitus (HCC)      Significant Hospital Events: Including procedures, antibiotic start and stop dates in addition to other pertinent events   12/2  Interim History / Subjective:  See above  Objective    Blood pressure 97/66, pulse 68, temperature (!) 97.5 F (36.4 C), temperature source Oral, resp. rate 16, height 5' 9 (1.753 m), weight 65.6 kg, SpO2 94%.        Intake/Output Summary (Last 24 hours) at 11/13/2024 1450 Last data filed at 11/13/2024 1205 Gross per 24 hour  Intake 100 ml  Output 400 ml  Net -300 ml   Filed Weights   11/12/24 0559 11/13/24 0500  Weight: 65.2 kg 65.6 kg    Examination: Awake, alert, comfortable On room air Decreased breath sounds left chest   Resolved problem list   Assessment and Plan   Left sided pna Left exudative pleural effusion: likely empyema Possible aspiration Hx of bronchiectasis and asthma -Pleural LDH >2,500. TNC count 1,970,000 Plan: -currently on unasyn for possible aspiration; added doxycycline  for atypical coverage -follow pleural cultures/cytology -we did bedside ultrasound post thoracentesis today, there Is still a decent amount of loculated fluid on left side. will need Chest tube placement and potential tube lytics depending on drainage.  - have placed order for IR guided chest tube placement.  - please place chest tube to -20 suction; monitor outpt - cxr in am - pulm toilet -  cont home maintenance inhaler/nebs - on steroids; consider stopping as doesn't appear to have acute exacerbation - aspiration precautions. Advised pt to follow speech therapy recommendations   Labs   CBC: Recent Labs  Lab 11/12/24 0241 11/12/24 0246 11/13/24 0221  WBC 26.1*  --  23.3*  NEUTROABS 24.3*  --   --   HGB 12.0* 13.6 10.3*  HCT 38.9* 40.0 32.2*  MCV 81.7  --  79.1*  PLT 628*  --  445*    Basic Metabolic Panel: Recent Labs  Lab 11/12/24 0241 11/12/24 0246 11/13/24 0221   NA 127* 127* 132*  K 5.4* 5.1 4.8  CL 91*  --  94*  CO2 19*  --  28  GLUCOSE 155*  --  182*  BUN 25*  --  26*  CREATININE 1.07  --  1.16  CALCIUM  8.7*  --  8.5*  MG  --   --  1.9   GFR: Estimated Creatinine Clearance: 46.3 mL/min (by C-G formula based on SCr of 1.16 mg/dL). Recent Labs  Lab 11/12/24 0241 11/12/24 0402 11/12/24 0718 11/13/24 0221  WBC 26.1*  --   --  23.3*  LATICACIDVEN  --  3.1* 2.1*  --     Liver Function Tests: Recent Labs  Lab 11/13/24 0221  AST 39  ALT 48*  ALKPHOS 111  BILITOT 0.5  PROT 5.9*  ALBUMIN 1.7*   No results for input(s): LIPASE, AMYLASE in the last 168 hours. No results for input(s): AMMONIA in the last 168 hours.  ABG    Component Value Date/Time   HCO3 23.4 11/12/2024 0246   TCO2 24 11/12/2024 0246   O2SAT 56 11/12/2024 0246     Coagulation Profile: No results for input(s): INR, PROTIME in the last 168 hours.  Cardiac Enzymes: No results for input(s): CKTOTAL, CKMB, CKMBINDEX, TROPONINI in the last 168 hours.  HbA1C: Hemoglobin A1C  Date/Time Value Ref Range Status  08/07/2024 09:14 AM 5.9 (A) 4.0 - 5.6 % Final   HbA1c, POC (prediabetic range)  Date/Time Value Ref Range Status  08/07/2024 09:14 AM 5.9 (A) 5.7 - 6.4 % Final   HbA1c, POC (controlled diabetic range)  Date/Time Value Ref Range Status  08/07/2024 09:14 AM 5.9 0.0 - 7.0 % Final   HbA1c POC (<> result, manual entry)  Date/Time Value Ref Range Status  08/07/2024 09:14 AM 5.9 4.0 - 5.6 % Final   Hgb A1c MFr Bld  Date/Time Value Ref Range Status  02/20/2024 10:37 AM 6.7 (H) 4.6 - 6.5 % Final    Comment:    Glycemic Control Guidelines for People with Diabetes:Non Diabetic:  <6%Goal of Therapy: <7%Additional Action Suggested:  >8%   08/23/2023 10:10 AM 6.6 (H) 4.6 - 6.5 % Final    Comment:    Glycemic Control Guidelines for People with Diabetes:Non Diabetic:  <6%Goal of Therapy: <7%Additional Action Suggested:  >8%     CBG: Recent  Labs  Lab 11/12/24 2044 11/13/24 0121 11/13/24 0426 11/13/24 0612 11/13/24 1113  GLUCAP 170* 161* 178* 165* 175*    Review of Systems:   Review of Systems  Respiratory:  Positive for cough and shortness of breath.   Cardiovascular:  Negative for chest pain.  Gastrointestinal:  Negative for abdominal pain, nausea and vomiting.   Past Medical History:  He,  has a past medical history of Arthritis, Asthma, CHF (congestive heart failure) (HCC), Chronic bronchitis (HCC), Environmental allergies, GERD (gastroesophageal reflux disease), Headache, History of blood transfusion, HTN (hypertension), Hyperlipidemia, LBBB (  left bundle branch block) (1999), Metatarsal bone fracture (03/07/2018), Myocardial infarction (HCC), Pneumonia, Presence of permanent cardiac pacemaker (07/25/2018), Seasonal allergies, Sleep apnea, and Type II diabetes mellitus (HCC).   Surgical History:   Past Surgical History:  Procedure Laterality Date   BACK SURGERY     BIV PACEMAKER INSERTION CRT-P  07/25/2018   BIV PACEMAKER INSERTION CRT-P N/A 07/25/2018   Procedure: BIV PACEMAKER INSERTION CRT-P;  Surgeon: Waddell Danelle ORN, MD;  [facility]: [facility] INVASIVE CV LAB;  Service: Cardiovascular;  Laterality: N/A;   CARDIAC CATHETERIZATION  2003   Dr Esmeralda Sharps; 85 % R circumflex obstruction   CARPAL TUNNEL RELEASE Left 10/11/2014   Procedure: LEFT CARPAL TUNNEL RELEASE;  Surgeon: Elsie Mussel, MD;  Location: [facility];  Service: Orthopedics;  Laterality: Left;   COLONOSCOPY W/ BIOPSIES AND POLYPECTOMY  2018   INGUINAL HERNIA REPAIR Right 1948   INGUINAL HERNIA REPAIR Left 1988   IR THORACENTESIS ASP PLEURAL SPACE W/IMG GUIDE  11/13/2024   LUMBAR LAMINECTOMY  1984   MINOR CARPAL TUNNEL Right 11/22/2014   Procedure: RIGHT LIMITED OPEN CARPAL TUNNEL RELEASE;  Surgeon: Elsie Mussel, MD;  Location: [facility];  Service: Orthopedics;  Laterality: Right;   RIGHT/LEFT HEART CATH AND CORONARY  ANGIOGRAPHY N/A 10/08/2024   Procedure: RIGHT/LEFT HEART CATH AND CORONARY ANGIOGRAPHY;  Surgeon: Rolan Ezra RAMAN, MD;  Location: [facility] INVASIVE CV LAB;  Service: Cardiovascular;  Laterality: N/A;   TONSILLECTOMY  1958     Social History:   reports that he quit smoking about 60 years ago. His smoking use included cigarettes. He started smoking about 62 years ago. He has a 4 pack-year smoking history. He has never used smokeless tobacco. He reports current alcohol use of about 2.0 standard drinks of alcohol per week. He reports that he does not use drugs.   Family History:  His family history includes Colon polyps in his father; Diabetes in his maternal aunt; Heart attack in his father; Heart failure in his mother; Hypertension in his mother; Subarachnoid hemorrhage (age of onset: 32) in his mother; Subarachnoid hemorrhage (age of onset: 38) in his paternal grandfather. There is no history of Colon cancer.   Allergies Allergies  Allergen Reactions   Contrast Media [Iodinated Contrast Media] Other (See Comments)    Redness and warm sensation, this reaction was noted on 02/27/13 during a Cardiac MRI per pt.  Pt sts he had erythema on his head, chest, and shoulders.  Pt had a pacemaker placed August 2019 and was pre-medicated for the dye and had no allergic reaction at that time. -Carissa Mozingo B.S. RT(R)(CT)     Home Medications  Prior to Admission medications   Medication Sig Start Date End Date Taking? Authorizing Provider  albuterol  (PROVENTIL ) (2.5 MG/3ML) 0.083% nebulizer solution Take 3 mLs (2.5 mg total) by nebulization every 6 (six) hours as needed for wheezing or shortness of breath. 04/24/24  Yes Burns, Glade PARAS, MD  albuterol  (VENTOLIN  HFA) 108 (90 Base) MCG/ACT inhaler TAKE 2 PUFFS BY MOUTH EVERY 6 HOURS AS NEEDED FOR WHEEZE OR SHORTNESS OF BREATH 06/21/22  Yes Burns, Glade PARAS, MD  aspirin  EC 81 MG tablet Take 1 tablet (81 mg total) by mouth at bedtime. Swallow whole. 12/05/23   Danford,  Lonni SQUIBB, MD  bisoprolol  (ZEBETA ) 5 MG tablet Take 0.5 tablets (2.5 mg total) by mouth daily. 09/12/24   Rolan Ezra RAMAN, MD  dapagliflozin  propanediol (FARXIGA ) 10 MG TABS tablet Take 1 tablet (10 mg total)  by mouth daily before breakfast. 12/20/23   Court Dorn PARAS, MD  fluticasone -salmeterol (ADVAIR HFA) 230-21 MCG/ACT inhaler Inhale 2 puffs into the lungs 2 (two) times daily. 08/07/24   Geofm Glade PARAS, MD  furosemide  (LASIX ) 40 MG tablet Take 0.5 tablets (20 mg total) by mouth daily. 11/07/24   Rolan Ezra RAMAN, MD  glipiZIDE  (GLUCOTROL ) 5 MG tablet TAKE 1-2 TABLETS (5-10 MG TOTAL) BY MOUTH 2 (TWO) TIMES DAILY BEFORE A MEAL. 05/11/24   Geofm Glade PARAS, MD  HYDROcodone -acetaminophen  (NORCO) 7.5-325 MG tablet Take 1 tablet by mouth every 6 (six) hours as needed for severe pain (pain score 7-10) (for chronic joint pain, neck pain). 11/07/24   Geofm Glade PARAS, MD  ipratropium (ATROVENT ) 0.02 % nebulizer solution TAKE 2.5 ML (0.5 MG TOTAL) BY NEBULIZATION 4 TIMES A DAY 08/31/24   Burns, Glade PARAS, MD  LORazepam  (ATIVAN ) 1 MG tablet TAKE 1/2 TO 1 TABLET BY MOUTH NIGHTLY AS NEEDED. 05/18/24   Geofm Glade PARAS, MD  metFORMIN  (GLUCOPHAGE ) 500 MG tablet TAKE 1 TABLET BY MOUTH TWICE A DAY WITH FOOD 11/12/24   Geofm Glade PARAS, MD  mirtazapine  (REMERON ) 7.5 MG tablet Take 1-2 tablets (7.5-15 mg total) by mouth at bedtime. 10/30/24   Geofm Glade PARAS, MD  montelukast  (SINGULAIR ) 10 MG tablet Take 1 tablet (10 mg total) by mouth at bedtime. 02/29/24   Geofm Glade PARAS, MD  nitroGLYCERIN  (NITROSTAT ) 0.4 MG SL tablet Place 1 tablet (0.4 mg total) under the tongue every 5 (five) minutes x 3 doses as needed for chest pain. 03/22/23   Hobart Powell BRAVO, MD  potassium chloride  20 MEQ TBCR Take 0.5 tablets (10 mEq total) by mouth daily. 07/19/24   Court Dorn PARAS, MD  rosuvastatin  (CRESTOR ) 10 MG tablet TAKE 1 TABLET ON MONDAY, WEDNESDAY & FRIDAY. 12/20/23   Court Dorn PARAS, MD  spironolactone  (ALDACTONE ) 25 MG tablet Take 1  tablet (25 mg total) by mouth daily. 10/18/24   Rolan Ezra RAMAN, MD  valsartan  (DIOVAN ) 40 MG tablet Take 0.5 tablets (20 mg total) by mouth daily. 10/18/24   Rolan Ezra RAMAN, MD  vardenafil  (LEVITRA ) 20 MG tablet TAKE 1 TABLET BY MOUTH AS DIRECTED. CAN NOT BE TAKEN WITH NITROGLYCERIN . NOT COVERED BY INSURANCE 10/13/23   Burns, Glade PARAS, MD     Thank you for the opportunity to take part in the care of this patient

## 2024-11-13 NOTE — Plan of Care (Signed)

## 2024-11-13 NOTE — Progress Notes (Addendum)
 Progress Note   Patient: Jeffery [name] FMW:[id number] DOB: [birth year] DOA: 11/12/2024     1 DOS: the patient was seen and examined on 11/13/2024   Brief hospital course: Mr. Mestas was admitted to the hospital with the working diagnosis of aspiration pneumonia, with complicated pleural effusion (left)    83 yo male with the past medical history of asthma, bronchiectasis, heart failure, T2DM, and heart block sp pacemaker who presented with cough, dyspnea and malaise.  Reported worsening symptoms over the last 4 weeks prior to admission. Continue to have severe symptoms despite use of his bronchodilators, on the day of admission his 02 saturation was 35%, along with increased work of breathing. EMS was called, he received on dose of bronchodilator neb and was transported to the ED.  On his initial physical examination his blood pressure was 105/47, HR 59, RR 20 and 02 saturation 93% on 2 L/min per [place] Lungs with coarse breath sounds with rales on the left side, heart with S1 and S2 present and regular with no gallops or rubs, abdomen with no distention and no lower extremity edema.   Chest radiograph with cardiomegaly with left upper lobe and left lower lobe patchy infiltrates.  Left lower lobe large density/ pleural effusion.  Pacemaker in place with one right atrium and one right ventricular lead.   CT chest with diffuse patchy infiltrates left upper and lower lobe.  Large heterogeneous left pleural effusions with areas of hyperdense fluid. Debris in the left mainstem bronchus and occluding the left lower lobe bronchus and multiple lingular bronchi.  Punctuate hyperdensities in the left lower lobe.   EKG 73 bpm, right axis deviation, left bundle branch block, qtc 472, ventricular paced rhythm with atrial flutter underlying rhythm, with no significant ST segment or T wave changes.   Patient was placed on antibiotic therapy, and had swallow evaluation. Positive aspiration, patient  decided to continue po intake, despite aspiration risk  12/02 thoracentesis with purulent exudate.   Assessment and Plan: * Left lower lobe pneumonia Left upper and lower lobe pneumonia, with likely complicated left pleural effusion/ empyema.  Severe sepsis, present on admission, resolved.    Follow up wbc is trending down to 23 from 26  He has been afebrile  Cultures with no growth.  Thoracentesis 150 ml of purulent fluid, LDH >2,500, consistent with exudate. Pending cell count and culture.   Plan to continue antibiotic therapy with Unasyn, for aspiration pneumonia.  Bronchodilator therapy and systemic corticosteroids with prednisone   Consult pulmonary, patient may need a chest tube to drain empyema.   Continue bronchodilator therapy, inhaled and systemic corticosteroids. Continue aspiration precautions, swallow dysfunction due to large cervical osteophytes.   Acute hypoxemic respiratory failure  02 saturation is 94% on room air.   Out of bed tid to the chair Airways clearing technique with flutter valve and incentive spirometer.   Chronic systolic CHF (congestive heart failure) (HCC) Echocardiogram with reduced LV systolic function with Ef 25 to 30%, global hypokinesis, mild LVH, fusion of EA, RV systolic function with moderate reduction, RV moderately enlarged, RVSP 40.9 mmHg, LA and RA with mild dilatation, no significant valvular disease.   No clinical signs of exacerbation   Continue bisoprolol  and SGLT 2 inh Hold on spironolactone  and ARB due to risk of hyperkalemia and hypotension   Atrial flutter (HCC) Patient not on anticoagulation, telemetry with paced rhythm.  Continue bisoprolol  and aspirin .  Not on anticoagulation at home  Continue telemetry monitoring   Cylindrical bronchiectasis (HCC)  Asthma, with exacerbation   Plan to continue bronchodilator therapy, inhaled and systemic corticosteroids.  Airway clearing techniques with flutter valve and incentive  spirometry.   Hyponatremia Follow up renal function with serum cr at 1,1 with K at 4,8 and serum bicarbonate at 28  Na 132 and Mg 1,9   Type 2 diabetes mellitus with hyperlipidemia (HCC) Continue glucose cover and monitoring with insulin  sliding scale.  Fasting glucose is 182 mg/dl   Continue with statin therapy       Subjective: Patient is feeling better, continue to have productive cough and left sided chest pain, he had thoracentesis today   Physical Exam: Vitals:   11/13/24 0725 11/13/24 0759 11/13/24 1115 11/13/24 1415  BP: (!) 94/52  97/66   Pulse: 60 65 66 68  Resp: 18 18 18 16   Temp: (!) 97.4 F (36.3 C)  (!) 97.5 F (36.4 C)   TempSrc: Oral  Oral   SpO2: 92% 92% 94% 94%  Weight:      Height:       Neurology awake and alert ENT with mild pallor  Cardiovascular with S1 and S2 present and regular with no gallops, or rubs Respiratory with left sided rales with scattered rhonchi, with no wheezing, prolonged expiratory phase Abdomen with no distention No lower extremity edema  Data Reviewed:    Family Communication: I spoke with patient's daughter at the bedside, we talked in detail about patient's condition, plan of care and prognosis and all questions were addressed.   Disposition: Status is: Inpatient Remains inpatient appropriate because: IV antibiotics   Planned Discharge Destination: Home     Author: Elidia Toribio Furnace, MD 11/13/2024 2:31 PM  For on call review [email or web address].

## 2024-11-13 NOTE — Procedures (Signed)
 PROCEDURE SUMMARY:  Successful US  guided left thoracentesis. Yielded 150 ml of green-tinged, purulent fluid. Pt tolerated procedure well. No immediate complications.  Specimen sent for labs. CXR ordered; no post-procedure pneumothorax identified.   EBL < 2 mL  Warren JONELLE Dais, NP 11/13/2024 11:03 AM

## 2024-11-13 NOTE — Telephone Encounter (Signed)
 Patient Product/process Development Scientist completed.    The patient is insured through [employer]/ [employer]. Patient has Medicare and is not eligible for a copay card, but may be able to apply for patient assistance or Medicare RX Payment Plan (Patient Must reach out to their plan, if eligible for payment plan), if available.    Ran test claim for Farxiga  10 mg and the current 30 day co-pay is $0.00.  Ran test claim for Breo Ellipta 200-25 mcg mg and the current 30 day co-pay is $0.00.  This test claim was processed through [facility]- copay amounts may vary at other pharmacies due to pharmacy/plan contracts, or as the patient moves through the different stages of their insurance plan.     Reyes Sharps, CPHT Pharmacy Technician Patient Advocate Specialist Lead [facility] Patient Advocate Team Direct Number: [phone]  Fax: [phone]

## 2024-11-13 NOTE — Progress Notes (Signed)
 Mobility Specialist Progress Note:    11/13/24 1321  Mobility  Activity Respositioned in chair (Ankle Pumps, leg ext, sitting marches)  Level of Assistance Standby assist, set-up cues, supervision of patient - no hands on  Assistive Device None  Range of Motion/Exercises Active  Activity Response Tolerated fair  Mobility Referral Yes  Mobility visit 1 Mobility  Mobility Specialist Start Time (ACUTE ONLY) 1321  Mobility Specialist Stop Time (ACUTE ONLY) 1338  Mobility Specialist Time Calculation (min) (ACUTE ONLY) 17 min   Received pt sitting in recliner agreeable to mobility session. Pt c/o fatigue but otherwise doing well. Pt able to move and perform exercises decently. Better on left leg than right leg. Left pt in recliner w/ all needs met and family in room.   Venetia Keel Mobility Specialist Please Neurosurgeon or Rehab Office at [phone]

## 2024-11-14 ENCOUNTER — Encounter (HOSPITAL_COMMUNITY): Payer: Self-pay | Admitting: Family Medicine

## 2024-11-14 ENCOUNTER — Inpatient Hospital Stay (HOSPITAL_COMMUNITY)

## 2024-11-14 DIAGNOSIS — J189 Pneumonia, unspecified organism: Secondary | ICD-10-CM | POA: Diagnosis not present

## 2024-11-14 LAB — CBC
HCT: 29.7 % — ABNORMAL LOW (ref 39.0–52.0)
Hemoglobin: 9.6 g/dL — ABNORMAL LOW (ref 13.0–17.0)
MCH: 25.3 pg — ABNORMAL LOW (ref 26.0–34.0)
MCHC: 32.3 g/dL (ref 30.0–36.0)
MCV: 78.2 fL — ABNORMAL LOW (ref 80.0–100.0)
Platelets: 463 K/uL — ABNORMAL HIGH (ref 150–400)
RBC: 3.8 MIL/uL — ABNORMAL LOW (ref 4.22–5.81)
RDW: 17.1 % — ABNORMAL HIGH (ref 11.5–15.5)
WBC: 16.3 K/uL — ABNORMAL HIGH (ref 4.0–10.5)
nRBC: 0 % (ref 0.0–0.2)

## 2024-11-14 LAB — BASIC METABOLIC PANEL WITH GFR
Anion gap: 8 (ref 5–15)
BUN: 28 mg/dL — ABNORMAL HIGH (ref 8–23)
CO2: 28 mmol/L (ref 22–32)
Calcium: 8.4 mg/dL — ABNORMAL LOW (ref 8.9–10.3)
Chloride: 96 mmol/L — ABNORMAL LOW (ref 98–111)
Creatinine, Ser: 0.93 mg/dL (ref 0.61–1.24)
GFR, Estimated: >60 mL/min (ref >60)
Glucose, Bld: 138 mg/dL — ABNORMAL HIGH (ref 70–99)
Potassium: 3.8 mmol/L (ref 3.5–5.1)
Sodium: 132 mmol/L — ABNORMAL LOW (ref 135–145)

## 2024-11-14 LAB — GLUCOSE, CAPILLARY
Glucose-Capillary: 139 mg/dL — ABNORMAL HIGH (ref 70–99)
Glucose-Capillary: 140 mg/dL — ABNORMAL HIGH (ref 70–99)
Glucose-Capillary: 143 mg/dL — ABNORMAL HIGH (ref 70–99)
Glucose-Capillary: 116 mg/dL — ABNORMAL HIGH (ref 70–99)
Glucose-Capillary: 189 mg/dL — ABNORMAL HIGH (ref 70–99)
Glucose-Capillary: 127 mg/dL — ABNORMAL HIGH (ref 70–99)
Glucose-Capillary: 149 mg/dL — ABNORMAL HIGH (ref 70–99)

## 2024-11-14 LAB — PROTIME-INR
INR: 1.2 (ref 0.8–1.2)
Prothrombin Time: 15.7 s — ABNORMAL HIGH (ref 11.4–15.2)

## 2024-11-14 LAB — CYTOLOGY - NON PAP

## 2024-11-14 MED ORDER — FENTANYL CITRATE (PF) 100 MCG/2ML IJ SOLN
INTRAMUSCULAR | Status: AC
  Filled 2024-11-14: qty 2

## 2024-11-14 MED ORDER — SODIUM CHLORIDE 0.9% FLUSH
10.0000 mL | Freq: Three times a day (TID) | INTRAVENOUS | Status: DC
  Administered 2024-11-15: 10 mL via INTRAPLEURAL

## 2024-11-14 MED ORDER — SODIUM CHLORIDE 0.9% FLUSH
5.0000 mL | Freq: Three times a day (TID) | INTRAVENOUS | Status: DC
  Administered 2024-11-14 – 2024-11-15 (×3): 5 mL

## 2024-11-14 MED ORDER — MAGIC MOUTHWASH W/LIDOCAINE
10.0000 mL | Freq: Four times a day (QID) | ORAL | Status: DC
  Administered 2024-11-14 – 2024-11-19 (×21): 10 mL via ORAL
  Filled 2024-11-14 (×24): qty 10

## 2024-11-14 MED ORDER — FENTANYL CITRATE (PF) 100 MCG/2ML IJ SOLN
INTRAMUSCULAR | Status: AC | PRN
  Administered 2024-11-14: 25 ug via INTRAVENOUS

## 2024-11-14 MED ORDER — NYSTATIN 100000 UNIT/ML MT SUSP
5.0000 mL | Freq: Four times a day (QID) | OROMUCOSAL | Status: DC
  Administered 2024-11-14 – 2024-11-19 (×19): 500000 [IU] via OROMUCOSAL
  Filled 2024-11-14 (×21): qty 5

## 2024-11-14 MED ORDER — SACCHAROMYCES BOULARDII 250 MG PO CAPS
250.0000 mg | ORAL_CAPSULE | Freq: Two times a day (BID) | ORAL | Status: DC
  Administered 2024-11-14 – 2024-11-19 (×11): 250 mg via ORAL
  Filled 2024-11-14 (×11): qty 1

## 2024-11-14 MED ORDER — MIDAZOLAM HCL 2 MG/2ML IJ SOLN
INTRAMUSCULAR | Status: AC
  Filled 2024-11-14: qty 2

## 2024-11-14 MED ORDER — SODIUM CHLORIDE 0.9% FLUSH
20.0000 mL | INTRAVENOUS | Status: DC
  Administered 2024-11-14 – 2024-11-15 (×6): 20 mL via INTRAVENOUS

## 2024-11-14 MED ORDER — MIDAZOLAM HCL (PF) 2 MG/2ML IJ SOLN
INTRAMUSCULAR | Status: AC | PRN
  Administered 2024-11-14: .5 mg via INTRAVENOUS

## 2024-11-14 NOTE — Progress Notes (Signed)
 SATURATION QUALIFICATIONS: (This note is used to comply with regulatory documentation for home oxygen)  Patient Saturations on Room Air at Rest = 82%  Patient Saturations on Room Air while Ambulating = NT as pt desaturates on RA at rest Patient Saturations on 4 Liters of oxygen while Ambulating = 88%  Please briefly explain why patient needs home oxygen:Pt requiring 2LO2 at rest to keep sats > 88% and 4LO2 with activity to keep O2 >88%.  Shanikia Kernodle M,PT Acute Rehab Services [phone]

## 2024-11-14 NOTE — Consult Note (Signed)
 Chief Complaint: Patient was seen in consultation today for empyema  Referring Physician(s): Dr. Carter Helling  Supervising Physician: Jenna Hacker  Patient Status: [facility] - In-pt  History of Present Illness: Jeffery [name] is a 83 y.o. male history of asthma, bronchiectasis, DM2, heart block status post pacemaker, chronic systolic CHF who comes into the hospital with complaints of cough, dyspnea, malaise ongoing for the last several weeks if not months associated with weight loss.  He was also recently diagnosed with pneumonia.  He underwent thoracentesis yesterday with aspiration of 150 mL of purulent fluid.  CCM has evaluated and recommended CT-guided left chest tube placement.   Patient assessed at bedside.  He has been NPO this AM and desire sedation if offered.   He is aware of the request for chest tube placement and is agreeable to proceed.   Past Medical History:  Diagnosis Date   Arthritis    thumbs (07/25/2018)   Asthma    CHF (congestive heart failure) (HCC)    Chronic bronchitis (HCC)    Environmental allergies    GERD (gastroesophageal reflux disease)    Headache    seasonal; w/environmental allergies (07/25/2018)   History of blood transfusion    when I had laminectomy (07/25/2018)   HTN (hypertension)    Hyperlipidemia    LBBB (left bundle branch block) 1999   Metatarsal bone fracture 03/07/2018   Myocardial infarction [facility])    was told I've had an old MI; probably in the 1990s (07/25/2018)   Pneumonia    as a child, age 78; viral pneumonia 3 times in the last 10 years (07/25/2018)   Presence of permanent cardiac pacemaker 07/25/2018   Seasonal allergies    Sleep apnea    wife says I do (07/25/2018)   Type II diabetes mellitus (HCC)     Past Surgical History:  Procedure Laterality Date   BACK SURGERY     BIV PACEMAKER INSERTION CRT-P  07/25/2018   BIV PACEMAKER INSERTION CRT-P N/A 07/25/2018   Procedure: BIV PACEMAKER INSERTION CRT-P;   Surgeon: Waddell Danelle ORN, MD;  Location: [facility] INVASIVE CV LAB;  Service: Cardiovascular;  Laterality: N/A;   CARDIAC CATHETERIZATION  2003   Dr Esmeralda Sharps; 85 % R circumflex obstruction   CARPAL TUNNEL RELEASE Left 10/11/2014   Procedure: LEFT CARPAL TUNNEL RELEASE;  Surgeon: Elsie Mussel, MD;  Location: [facility];  Service: Orthopedics;  Laterality: Left;   COLONOSCOPY W/ BIOPSIES AND POLYPECTOMY  2018   INGUINAL HERNIA REPAIR Right 1948   INGUINAL HERNIA REPAIR Left 1988   IR THORACENTESIS ASP PLEURAL SPACE W/IMG GUIDE  11/13/2024   LUMBAR LAMINECTOMY  1984   MINOR CARPAL TUNNEL Right 11/22/2014   Procedure: RIGHT LIMITED OPEN CARPAL TUNNEL RELEASE;  Surgeon: Elsie Mussel, MD;  Location: [facility];  Service: Orthopedics;  Laterality: Right;   RIGHT/LEFT HEART CATH AND CORONARY ANGIOGRAPHY N/A 10/08/2024   Procedure: RIGHT/LEFT HEART CATH AND CORONARY ANGIOGRAPHY;  Surgeon: Rolan Ezra RAMAN, MD;  Location: [facility] INVASIVE CV LAB;  Service: Cardiovascular;  Laterality: N/A;   TONSILLECTOMY  1958    Allergies: Contrast media [iodinated contrast media]  Medications: Prior to Admission medications   Medication Sig Start Date End Date Taking? Authorizing Provider  albuterol  (VENTOLIN  HFA) 108 (90 Base) MCG/ACT inhaler TAKE 2 PUFFS BY MOUTH EVERY 6 HOURS AS NEEDED FOR WHEEZE OR SHORTNESS OF BREATH 06/21/22  Yes Burns, Glade PARAS, MD  bisoprolol  (ZEBETA ) 5 MG tablet Take 0.5 tablets (2.5  mg total) by mouth daily. 09/12/24  Yes Rolan Ezra RAMAN, MD  dapagliflozin  propanediol (FARXIGA ) 10 MG TABS tablet Take 1 tablet (10 mg total) by mouth daily before breakfast. 12/20/23  Yes Court Dorn PARAS, MD  fluticasone -salmeterol (ADVAIR HFA) 230-21 MCG/ACT inhaler Inhale 2 puffs into the lungs 2 (two) times daily. 08/07/24  Yes Burns, Glade PARAS, MD  furosemide  (LASIX ) 40 MG tablet Take 0.5 tablets (20 mg total) by mouth daily. 11/07/24  Yes Rolan Ezra RAMAN, MD  glipiZIDE  (GLUCOTROL )  5 MG tablet TAKE 1-2 TABLETS (5-10 MG TOTAL) BY MOUTH 2 (TWO) TIMES DAILY BEFORE A MEAL. 05/11/24  Yes Burns, Glade PARAS, MD  HYDROcodone -acetaminophen  (NORCO) 7.5-325 MG tablet Take 1 tablet by mouth every 6 (six) hours as needed for severe pain (pain score 7-10) (for chronic joint pain, neck pain). 11/07/24  Yes Burns, Glade PARAS, MD  ipratropium (ATROVENT ) 0.02 % nebulizer solution TAKE 2.5 ML (0.5 MG TOTAL) BY NEBULIZATION 4 TIMES A DAY Patient taking differently: Take 0.5 mg by nebulization 2 (two) times daily as needed for shortness of breath or wheezing. 08/31/24  Yes Burns, Glade PARAS, MD  metFORMIN  (GLUCOPHAGE ) 500 MG tablet TAKE 1 TABLET BY MOUTH TWICE A DAY WITH FOOD 11/12/24  Yes Burns, Glade PARAS, MD  mirtazapine  (REMERON ) 7.5 MG tablet Take 1-2 tablets (7.5-15 mg total) by mouth at bedtime. Patient taking differently: Take 15 mg by mouth at bedtime. 10/30/24  Yes Burns, Glade PARAS, MD  montelukast  (SINGULAIR ) 10 MG tablet Take 1 tablet (10 mg total) by mouth at bedtime. 02/29/24  Yes Burns, Glade PARAS, MD  nitroGLYCERIN  (NITROSTAT ) 0.4 MG SL tablet Place 1 tablet (0.4 mg total) under the tongue every 5 (five) minutes x 3 doses as needed for chest pain. 03/22/23  Yes Hobart Powell BRAVO, MD  potassium chloride  20 MEQ TBCR Take 0.5 tablets (10 mEq total) by mouth daily. 07/19/24  Yes Court Dorn PARAS, MD  spironolactone  (ALDACTONE ) 25 MG tablet Take 1 tablet (25 mg total) by mouth daily. 10/18/24  Yes Rolan Ezra RAMAN, MD  traMADol  (ULTRAM ) 50 MG tablet Take 50-100 mg by mouth every 8 (eight) hours as needed for moderate pain (pain score 4-6) or severe pain (pain score 7-10).   Yes [provider]  valsartan  (DIOVAN ) 40 MG tablet Take 0.5 tablets (20 mg total) by mouth daily. 10/18/24  Yes Rolan Ezra RAMAN, MD  vitamin E 180 MG (400 UNITS) capsule Take 400 Units by mouth daily.   Yes [provider]  albuterol  (PROVENTIL ) (2.5 MG/3ML) 0.083% nebulizer solution Take 3 mLs (2.5 mg total) by  nebulization every 6 (six) hours as needed for wheezing or shortness of breath. 04/24/24   Geofm Glade PARAS, MD  aspirin  EC 81 MG tablet Take 1 tablet (81 mg total) by mouth at bedtime. Swallow whole. 12/05/23   Danford, Lonni SQUIBB, MD  LORazepam  (ATIVAN ) 1 MG tablet TAKE 1/2 TO 1 TABLET BY MOUTH NIGHTLY AS NEEDED. 05/18/24   Geofm Glade PARAS, MD  rosuvastatin  (CRESTOR ) 10 MG tablet TAKE 1 TABLET ON MONDAY, WEDNESDAY & FRIDAY. 12/20/23   Court Dorn PARAS, MD     Family History  Problem Relation Age of Onset   Heart attack Father        42s   Colon polyps Father    Heart failure Mother        90s   Subarachnoid hemorrhage Mother 34   Hypertension Mother    Subarachnoid hemorrhage Paternal Grandfather 31   Diabetes  Maternal Aunt    Colon cancer Neg Hx     Social History   Socioeconomic History   Marital status: Married    Spouse name: Not on file   Number of children: 3   Years of education: Not on file   Highest education level: Professional school degree (e.g., MD, DDS, DVM, JD)  Occupational History   Occupation: retired Scientist, Forensic: [employer]  Tobacco Use   Smoking status: Former    Current packs/day: 0.00    Average packs/day: 2.0 packs/day for 2.0 years (4.0 ttl pk-yrs)    Types: Cigarettes    Start date: 12/13/1961    Quit date: 12/14/1963    Years since quitting: 60.9   Smokeless tobacco: Never  Vaping Use   Vaping status: Never Used  Substance and Sexual Activity   Alcohol use: Yes    Alcohol/week: 2.0 standard drinks of alcohol    Types: 2 Glasses of wine per week    Comment: occasional   Drug use: Never   Sexual activity: Yes  Other Topics Concern   Not on file  Social History Narrative   Lives with wife in a 2 story home.  Has 3 daughters.  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     Social Drivers of Corporate Investment Banker Strain: Low Risk  (06/07/2023)   Overall Financial Resource Strain (CARDIA)    Difficulty of Paying Living  Expenses: Not hard at all  Food Insecurity: No Food Insecurity (11/12/2024)   Hunger Vital Sign    Worried About Running Out of Food in the Last Year: Never true    Ran Out of Food in the Last Year: Never true  Transportation Needs: No Transportation Needs (11/12/2024)   PRAPARE - Administrator, Civil Service (Medical): No    Lack of Transportation (Non-Medical): No  Physical Activity: Sufficiently Active (06/07/2023)   Exercise Vital Sign    Days of Exercise per Week: 5 days    Minutes of Exercise per Session: 30 min  Stress: No Stress Concern Present (06/07/2023)   [employer] of Occupational Health - Occupational Stress Questionnaire    Feeling of Stress : Not at all  Social Connections: Socially Integrated (11/12/2024)   Social Connection and Isolation Panel    Frequency of Communication with Friends and Family: More than three times a week    Frequency of Social Gatherings with Friends and Family: More than three times a week    Attends Religious Services: More than 4 times per year    Active Member of [employer] or Organizations: Yes    Attends Engineer, Structural: More than 4 times per year    Marital Status: Married     Review of Systems: A 12 point ROS discussed and pertinent positives are indicated in the HPI above.  All other systems are negative.  Review of Systems  Constitutional:  Negative for fatigue and fever.  Respiratory:  Negative for cough and shortness of breath.   Cardiovascular:  Negative for chest pain.  Gastrointestinal:  Negative for abdominal pain, nausea and vomiting.  Musculoskeletal:  Negative for back pain.  Psychiatric/Behavioral:  Negative for behavioral problems and confusion.     Vital Signs: BP 100/64 (BP Location: Right Arm)   Pulse 65   Temp 98 F (36.7 C) (Oral)   Resp 18   Ht 5' 9 (1.753 m)   Wt 143 lb 8.3 oz (65.1 kg)   SpO2 99%  BMI 21.19 kg/m   Physical Exam Vitals and nursing note reviewed.   Constitutional:      General: He is not in acute distress.    Appearance: He is well-developed. He is not ill-appearing.  Cardiovascular:     Rate and Rhythm: Normal rate and regular rhythm.  Pulmonary:     Effort: Tachypnea present. No respiratory distress.     Comments: Decreased left breath sounds Skin:    General: Skin is warm.  Neurological:     General: No focal deficit present.     Mental Status: He is alert and oriented to person, place, and time.  Psychiatric:        Mood and Affect: Mood normal.        Behavior: Behavior normal.      MD Evaluation Airway: WNL Heart: WNL Abdomen: WNL Chest/ Lungs: WNL ASA  Classification: 3 Mallampati/Airway Score: Two   Imaging: IR THORACENTESIS ASP PLEURAL SPACE W/IMG GUIDE Result Date: 11/13/2024 INDICATION: Patient admitted with pneumonia and found to have a complex left pleural effusion. Interventional Radiology asked to perform a diagnostic and therapeutic thoracentesis. EXAM: ULTRASOUND GUIDED THORACENTESIS MEDICATIONS: 1% lidocaine  10 mL COMPLICATIONS: None immediate. PROCEDURE: An ultrasound guided thoracentesis was thoroughly discussed with the patient and questions answered. The benefits, risks, alternatives and complications were also discussed. The patient understands and wishes to proceed with the procedure. Written consent was obtained. Ultrasound was performed to localize and mark an adequate pocket of fluid in the left chest. The area was then prepped and draped in the normal sterile fashion. 1% Lidocaine  was used for local anesthesia. Under ultrasound guidance a 6 Fr Safe-T-Centesis catheter was introduced. Thoracentesis was performed. The catheter was removed and a dressing applied. FINDINGS: A total of approximately 150 mL of green-tinged, purulent fluid fluid was removed. Samples were sent to the laboratory as requested by the clinical team. IMPRESSION: Successful ultrasound guided left thoracentesis yielding 150 mL of  pleural fluid. Procedure performed by: Warren Dais, NP Electronically Signed   By: Juliene Balder M.D.   On: 11/13/2024 12:58   DG Chest 1 View Result Date: 11/13/2024 CLINICAL DATA:  Status post left thoracentesis. EXAM: CHEST  1 VIEW COMPARISON:  11/12/2024 FINDINGS: Normal sized heart. Increased patchy and consolidative density in the left lung. Decreased left pleural fluid. No pneumothorax. Clear right lung. Stable left subclavian pacemaker leads. Thoracic spine degenerative changes. Mild-to-moderate left shoulder degenerative changes with superior migration of the humeral head compatible with a large, chronic rotator cuff tear. IMPRESSION: 1. No pneumothorax following left thoracentesis. 2. Increased left lung pneumonia. 3. Decreased left pleural fluid. Electronically Signed   By: Elspeth Bathe M.D.   On: 11/13/2024 10:49   CT CHEST W CONTRAST Result Date: 11/13/2024 EXAM: CT CHEST WITH CONTRAST 11/12/2024 07:52:59 PM TECHNIQUE: CT of the chest was performed with the administration of 75mL iohexol  (OMNIPAQUE ) 350 MG/ML injection. Multiplanar reformatted images are provided for review. Automated exposure control, iterative reconstruction, and/or weight based adjustment of the mA/kV was utilized to reduce the radiation dose to as low as reasonably achievable. COMPARISON: 11/12/24 at 3:37 am CLINICAL HISTORY: Pneumonia, complication suspected, xray done. FINDINGS: MEDIASTINUM: Aortic atherosclerotic calcifications and coronary artery calcifications are present. A left chest wall pacer is noted with leads in the right atrial appendage, right ventricle, and coronary sinus. The central airways are patent. Pericardium is unremarkable. LYMPH NODES: Prominent left hilar and mediastinal lymph nodes are noted, measuring up to 1.3 cm (image 67/3). No axillary lymphadenopathy. LUNGS  AND PLEURA: There is a loculated pleural effusion overlying the left lung base, which measures 10.4 x 6.3 x 11.3 cm and has a volume of 370  cc. The average Hounsfield units equal 35, consistent with underlying complex fluid (axial image 111/3 and sagittal image 146/7). There is dense consolidation involving the left lower lobe and posterior and lateral left upper lobe with additional patchy areas of airspace densities extending into the left apex. Mild subsegmental atelectasis versus scar noted within the right lung base. No pulmonary edema. No pneumothorax. SOFT TISSUES/BONES: No acute abnormality of the bones or soft tissues. UPPER ABDOMEN: Limited images of the upper abdomen demonstrates a tiny hiatal hernia. IMPRESSION: 1. Dense consolidation in the left lower lobe and posterior/lateral left upper lobe with additional patchy airspace disease extending to the left apex, most consistent with pneumonia. 2. Large loculated left basal pleural effusion (approximately 370 cc) with complex fluid, which may represent parapneumonic effusion or empyema. 3. Prominent left hilar and mediastinal lymph nodes measuring up to 1.3 cm, likely reactive in the setting of pneumonia. Electronically signed by: Waddell Calk MD 11/13/2024 05:13 AM EST RP Workstation: [id number]   DG Swallowing Func-Speech Pathology Result Date: 11/12/2024 Table formatting from the original result was not included. Modified Barium Swallow Study Patient Details Name: BRINLEY TREANOR MRN: [id number] Date of Birth: [birth year] Today's Date: 11/12/2024 HPI/PMH: HPI: 83 yo male presenting to ED 12/1 with worsening shortness of breath, cough, and malaise. CT Chest shows diffuse pneumonia throughout the L lung, debris in the L mainstem bronchus and occluding the LLL bronchus and multiple lingual bronchi, and punctate hyperdensities in the LLL may be due to aspiration. CT 01/26/2019 recommends f/u with SLP for an MBS due to tree in bud opacities and new patchy lower lobe foci of consolidation. CT Cervical Spine 2021 shows advanced multilevel degenerative disc disease and facet joint arthropathy with  at least moderate canal stenosis most severely involving C5-6 and C6-7 and prominent anterior endplate osteophytes. PMH: asthma, bronchiectasis, nonischemic cardiomyopathy with EF 25-30%, T2DM, high grade AV block with PPM, recently identified A-fib, GERD Clinical Impression: Pt presents with severe pharyngeal dysphagia secondary to significantly impaired efficiency. Cervical osteophytes (confirmed by CT 2021) affect epiglottic inversion, leading to nasopharyngeal escape, minimal pharyngeal clearance, and reduced laryngeal closure during the swallow. These factors contribute to sensed aspiration of thin liquids during and after the swallow (PAS 7) and frank penetration of nectar thick liquids (PAS 5). Larger volumes and thicker consistencies do improve epiglottic inversion but increase the amount of pharyngeal residue. While honey thick liquids, purees, and solids did not yield airway invasion, the majority of each bolus remained throughout the pharynx and did not clear with multiple swallows, increasing the potential for post-prandial penetration/aspiration. A chin tuck posture and bilateral head turns did not consistently improve epiglottic inversion. Suspect this represents chronic dysphagia given reports of significant weight loss and prior pneumonia. Findings may also represent increased likelihood of inadequate nutrition/hydration given minimal pharyngeal clearance. For now, recommend he remain NPO except ice chips and meds crushed in puree. Visited with pt and his family after the MBS to discuss results and provide education. Encourage ongoing discussion with MD and PMT to determine goals related to eating/drinking. Will continue following.  Factors that may increase risk of adverse event in presence of aspiration Noe & Lianne 2021): Factors that may increase risk of adverse event in presence of aspiration Noe & Lianne 2021): Poor general health and/or compromised immunity; Frail or deconditioned;  Reduced  saliva Recommendations/Plan: Swallowing Evaluation Recommendations Swallowing Evaluation Recommendations Recommendations: NPO except meds; Ice chips PRN after oral care Medication Administration: Crushed with puree Supervision: Patient able to self-feed; Full supervision/cueing for swallowing strategies Swallowing strategies  : Minimize environmental distractions; Slow rate; Small bites/sips; Multiple dry swallows after each bite/sip Postural changes: Position pt fully upright for meals; Stay upright 30-60 min after meals Oral care recommendations: Oral care BID (2x/day); Oral care before ice chips/water  Recommended consults: Consider Palliative care Treatment Plan Treatment Plan Treatment recommendations: Therapy as outlined in treatment plan below Follow-up recommendations: Outpatient SLP Functional status assessment: Patient has had a recent decline in their functional status and demonstrates the ability to make significant improvements in function in a reasonable and predictable amount of time. Treatment frequency: Min 2x/week Treatment duration: 2 weeks Interventions: Aspiration precaution training; Patient/family education; Trials of upgraded texture/liquids; Diet toleration management by SLP Recommendations Recommendations for follow up therapy are one component of a multi-disciplinary discharge planning process, led by the attending physician.  Recommendations may be updated based on patient status, additional functional criteria and insurance authorization. Assessment: Orofacial Exam: Orofacial Exam Oral Cavity: Oral Hygiene: Xerostomia Oral Cavity - Dentition: Adequate natural dentition Orofacial Anatomy: WFL Oral Motor/Sensory Function: WFL Anatomy: Anatomy: Suspected cervical osteophytes; Prominent cricopharyngeus Boluses Administered: Boluses Administered Boluses Administered: Thin liquids (Level 0); Mildly thick liquids (Level 2, nectar thick); Moderately thick liquids (Level 3, honey thick);  Puree; Solid  Oral Impairment Domain: Oral Impairment Domain Lip Closure: No labial escape Tongue control during bolus hold: Cohesive bolus between tongue to palatal seal Bolus preparation/mastication: Timely and efficient chewing and mashing Bolus transport/lingual motion: Brisk tongue motion Oral residue: Complete oral clearance Location of oral residue : N/A Initiation of pharyngeal swallow : Pyriform sinuses  Pharyngeal Impairment Domain: Pharyngeal Impairment Domain Soft palate elevation: Escape to nasopharynx Laryngeal elevation: Complete superior movement of thyroid  cartilage with complete approximation of arytenoids to epiglottic petiole Anterior hyoid excursion: Partial anterior movement Epiglottic movement: No inversion Laryngeal vestibule closure: Incomplete, narrow column air/contrast in laryngeal vestibule Pharyngeal stripping wave : Present - complete Pharyngeal contraction (A/P view only): N/A Pharyngoesophageal segment opening: Partial distention/partial duration, partial obstruction of flow Tongue base retraction: Wide column of contrast or air between tongue base and PPW Pharyngeal residue: Majority of contrast within or on pharyngeal structures Location of pharyngeal residue: Tongue base; Valleculae; Pharyngeal wall; Pyriform sinuses; Diffuse (>3 areas)  Esophageal Impairment Domain: No data recorded Pill: No data recorded Penetration/Aspiration Scale Score: Penetration/Aspiration Scale Score 1.  Material does not enter airway: Moderately thick liquids (Level 3, honey thick); Puree; Solid 5.  Material enters airway, CONTACTS cords and not ejected out: Mildly thick liquids (Level 2, nectar thick) 7.  Material enters airway, passes BELOW cords and not ejected out despite cough attempt by patient: Thin liquids (Level 0) Compensatory Strategies: Compensatory Strategies Compensatory strategies: Yes Chin tuck: Ineffective Ineffective Chin Tuck: Thin liquid (Level 0) Left head turn: Ineffective  Ineffective Left Head Turn: Mildly thick liquid (Level 2, nectar thick); Moderately thick liquid (Level 3, honey thick) Right head turn: Ineffective Ineffective Right Head Turn: Mildly thick liquid (Level 2, nectar thick); Moderately thick liquid (Level 3, honey thick)   General Information: Caregiver present: No  Diet Prior to this Study: NPO   Temperature : Normal   Respiratory Status: WFL   Supplemental O2: Nasal cannula   History of Recent Intubation: No  Behavior/Cognition: Alert; Cooperative Self-Feeding Abilities: Able to self-feed Baseline vocal quality/speech: Normal Volitional Cough: Able to elicit  Volitional Swallow: Able to elicit Exam Limitations: No limitations Goal Planning: Prognosis for improved oropharyngeal function: Fair Barriers to Reach Goals: Time post onset; Severity of deficits No data recorded Patient/Family Stated Goal: none stated Consulted and agree with results and recommendations: Patient; Physician Pain: Pain Assessment Pain Assessment: No/denies pain End of Session: Start Time:SLP Start Time (ACUTE ONLY): 1332 Stop Time: SLP Stop Time (ACUTE ONLY): 1355 Time Calculation:SLP Time Calculation (min) (ACUTE ONLY): 23 min Charges: SLP Evaluations $ SLP Speech Visit: 1 Visit SLP Evaluations $BSS Swallow: 1 Procedure $MBS Swallow: 1 Procedure SLP visit diagnosis: SLP Visit Diagnosis: Dysphagia, pharyngeal phase (R13.13) Past Medical History: Past Medical History: Diagnosis Date  Arthritis   thumbs (07/25/2018)  Asthma   CHF (congestive heart failure) (HCC)   Chronic bronchitis (HCC)   Environmental allergies   GERD (gastroesophageal reflux disease)   Headache   seasonal; w/environmental allergies (07/25/2018)  History of blood transfusion   when I had laminectomy (07/25/2018)  HTN (hypertension)   Hyperlipidemia   LBBB (left bundle branch block) 1999  Metatarsal bone fracture 03/07/2018  Myocardial infarction [facility])   was told I've had an old MI; probably in the 1990s (07/25/2018)   Pneumonia   as a child, age 30; viral pneumonia 3 times in the last 10 years (07/25/2018)  Presence of permanent cardiac pacemaker 07/25/2018  Seasonal allergies   Sleep apnea   wife says I do (07/25/2018)  Type II diabetes mellitus (HCC)  Past Surgical History: Past Surgical History: Procedure Laterality Date  BACK SURGERY    BIV PACEMAKER INSERTION CRT-P  07/25/2018  BIV PACEMAKER INSERTION CRT-P N/A 07/25/2018  Procedure: BIV PACEMAKER INSERTION CRT-P;  Surgeon: Waddell Danelle ORN, MD;  Location: [facility] INVAS[facility] LAB;  Service: Cardiovascular;  Laterality: N/A;  CARDIAC CATHETERIZATION  2003  Dr Esmeralda Sharps; 85 % R circumflex obstruction  CARPAL TUNNEL RELEASE Left 10/11/2014  Procedure: LEFT CARPAL TUNNEL RELEASE;  Surgeon: Elsie Mussel, MD;  Location: [facility];  Service: Orthopedics;  Laterality: Left;  COLONOSCOPY W/ BIOPSIES AND POLYPECTOMY  2018  INGUINAL HERNIA REPAIR Right 1948  INGUINAL HERNIA REPAIR Left 1988  LUMBAR LAMINECTOMY  1984  MINOR CARPAL TUNNEL Right 11/22/2014  Procedure: RIGHT LIMITED OPEN CARPAL TUNNEL RELEASE;  Surgeon: Elsie Mussel, MD;  Location: [facility];  Service: Orthopedics;  Laterality: Right;  RIGHT/LEFT HEART CATH AND CORONARY ANGIOGRAPHY N/A 10/08/2024  Procedure: RIGHT/LEFT HEART CATH AND CORONARY ANGIOGRAPHY;  Surgeon: Rolan Ezra RAMAN, MD;  Location: [facility] INVASIVE CV LAB;  Service: Cardiovascular;  Laterality: N/A;  TONSILLECTOMY  1958 Damien Blumenthal, M.A., CCC-SLP Speech Language Pathology, Acute Rehabilitation Services Secure Chat preferred [phone] 11/12/2024, 4:14 PM  CT Chest Wo Contrast Result Date: 11/12/2024 EXAM: CT CHEST WITHOUT CONTRAST 11/12/2024 03:42:41 AM TECHNIQUE: CT of the chest was performed without the administration of intravenous contrast. Multiplanar reformatted images are provided for review. Automated exposure control, iterative reconstruction, and/or weight based adjustment of the mA/kV was utilized to reduce the  radiation dose to as low as reasonably achievable. COMPARISON: Comparison with same day x-ray and CT 12/05/2023. CLINICAL HISTORY: Pneumonia, complication suspected, xray done. FINDINGS: MEDIASTINUM: Heart and pericardium are unremarkable. Debris in the left main stem bronchus and occluding the left lower lobe bronchus and multiple lingular bronchi and left lower lobe. Left chest wall pacemaker. LYMPH NODES: No mediastinal, hilar or axillary lymphadenopathy. LUNGS AND PLEURA: Large heterogeneous left pleural effusion with some hyperdense fluid which may represent exudate or clot products. Question thickening of the  visceral and parietal pleura however this is not well evaluated without IV contrast . Consolidation and ground glass opacities in the left upper and lower lobes. Bronchial wall thickening throughout the left lung. Scattered centrilobular cluster nodularity and tree-in-bud opacities in the left upper lobe and left lower lobe. Indeterminate punctate hyperdensities in the left lower lobe possibly due to aspiration of hyperdense material. The right lung is clear. No pneumothorax. SOFT TISSUES/BONES: No acute abnormality of the bones or soft tissues. UPPER ABDOMEN: Limited images of the upper abdomen demonstrates no acute abnormality. IMPRESSION: 1. Diffuse pneumonia throughout the left lung. 2. Large heterogeneous left pleural effusion with areas of hyperdense fluid, which may reflect exudate or clot. Possible wall thickening about the pleural fluid which were suggest empyema though this is not well evaluated without IV contrast. 3. Debris in the left mainstem bronchus and occluding the left lower lobe bronchus and multiple lingular bronchi. 4. Punctate hyperdensities in the left lower lobe may be due to aspiration. 5. Follow up after treatment is recommended to ensure resolution of these findings. Electronically signed by: Norman Gatlin MD 11/12/2024 03:56 AM EST RP Workstation: [id number]   DG Chest Port  1 View Result Date: 11/12/2024 EXAM: 1 VIEW(S) XRAY OF THE CHEST 11/12/2024 03:14:20 AM COMPARISON: 05/04/2024 CLINICAL HISTORY: resp distress FINDINGS: LUNGS AND PLEURA: Interval development of multifocal airspace opacities in left lung consistent with multifocal pneumonia. Layering left pleural effusion. No pneumothorax. HEART AND MEDIASTINUM: Dual lead left chest pacemaker noted. No acute abnormality of the cardiac and mediastinal silhouettes. BONES AND SOFT TISSUES: Thoracic degenerative changes. No acute osseous abnormality. IMPRESSION: 1. Pneumonia in the left lung with small right pleural effusion. Electronically signed by: Norman Gatlin MD 11/12/2024 03:17 AM EST RP Workstation: [id number]   CUP PACEART REMOTE DEVICE CHECK Result Date: 10/28/2024 PPM Scheduled remote reviewed. Normal device function.  Presenting rhythm: AS-BIV paced, PAC. Next remote transmission per protocol. - CS, CVRS   Labs:  CBC: Recent Labs    09/24/24 1039 10/08/24 0809 11/12/24 0241 11/12/24 0246 11/13/24 0221 11/14/24 0235  WBC 8.6  --  26.1*  --  23.3* 16.3*  HGB 12.9*   < > 12.0* 13.6 10.3* 9.6*  HCT 41.5   < > 38.9* 40.0 32.2* 29.7*  PLT 361  --  628*  --  445* 463*   < > = values in this interval not displayed.    COAGS: Recent Labs    11/14/24 0939  INR 1.2    BMP: Recent Labs    10/29/24 1123 11/12/24 0241 11/12/24 0246 11/13/24 0221 11/14/24 0235  NA 131* 127* 127* 132* 132*  K 4.9 5.4* 5.1 4.8 3.8  CL 92* 91*  --  94* 96*  CO2 21* 19*  --  28 28  GLUCOSE 166* 155*  --  182* 138*  BUN 27* 25*  --  26* 28*  CALCIUM  9.1 8.7*  --  8.5* 8.4*  CREATININE 1.42* 1.07  --  1.16 0.93  GFRNONAA 50* >60  --  >60 >60    LIVER FUNCTION TESTS: Recent Labs    02/20/24 1037 05/04/24 1457 11/13/24 0221  BILITOT 0.4 0.5 0.5  AST 19 18 39  ALT 19 24 48*  ALKPHOS 59 67 111  PROT 6.9 6.5 5.9*  ALBUMIN 4.3 4.0 1.7*    TUMOR MARKERS: No results for input(s): AFPTM, CEA,  CA199, CHROMGRNA in the last 8760 hours.  Assessment and Plan: Left empyema Patient with recent malaise, prior pneumonia now with  left eympema confirmed with left thoracentesis yesterday.  CCM requesting left chest tube placement.   Case reviewed by Dr. Jenna.  IR to place left chest tube with CCM to manage.   Risks and benefits discussed with the patient including bleeding, infection, damage to adjacent structures, and sepsis.  All of the patient's questions were answered, patient is agreeable to proceed. Consent signed and in chart.   Thank you for this interesting consult.  I greatly enjoyed meeting GERRAD WELKER and look forward to participating in their care.  A copy of this report was sent to the requesting provider on this date.  Electronically Signed: Andrienne Havener Sue-Ellen Sugar Vanzandt, PA 11/14/2024, 10:20 AM   I spent a total of 20 Minutes    in face to face in clinical consultation, greater than 50% of which was counseling/coordinating care for left empyema.

## 2024-11-14 NOTE — Procedures (Signed)
 Pre procedural Dx: left pleural fluid collection Post procedural Dx: Same  Technically successful CT guided placed of a 12Fr pigtail placed into the left pleural fluid collection..    A representative aspirated sample was capped and sent to the laboratory for analysis.    EBL: Trace Complications: None immediate  KANDICE Banner, MD Pager #: [phone]

## 2024-11-14 NOTE — Progress Notes (Signed)
 PROGRESS NOTE  Jeffery [name]:[id number] DOB: 05-31-1941 DOA: 11/12/2024 PCP: Jeffery Glade PARAS, MD   LOS: 2 days   Brief Narrative / Interim history: 83 year old male with history of asthma, bronchiectasis, DM2, heart block status post pacemaker, chronic systolic CHF who comes into the hospital with complaints of cough, dyspnea, malaise ongoing for the last several weeks if not months.  He has been having poor p.o. intake and has lost a lot of weight during this period of time.  He was found to have pneumonia with a pleural effusion, and upon sampling it turned to be purulent and pulmonary was consulted.  Subjective / 24h Interval events: He is doing well this morning, his main complaint is mouth pain and discomfort while eating.  Denies any shortness of breath, denies any congestion.  Assesement and Plan: Principal problem Left upper lobe pneumonia, empyema -underwent thoracentesis 12/2, found to have purulent fluid.  Pulmonary consulted for chest tube, awaiting placement by IR.  Appreciate pulmonary follow-up - Primary cultures with GPC's in chains, speciation pending.  Active problems Chronic systolic CHF -2D echo with LVEF 25-30%, global hypokinesis, mild LVH, RV systolic function with moderate reduction and enlargement.  Appears fairly euvolemic - Continue dapagliflozin , bisoprolol , statin  Heart block-status post pacemaker  Oral thrush-start nystatin  Diarrhea-possibly side effect from antibiotics.  Placed on Florastor  Hyperlipidemia-continue statin  History of a flutter - not on anticoagulation, telemetry showed paced rhythm.  Continue bisoprolol  and aspirin   History of bronchiectasis/asthma -no wheezing, felt to have exacerbation on admission and placed on prednisone , continue along with bronchodilators.  Hyponatremia-mild, monitor, no large shifts noted  DM2 -continue sliding scale  CBG (last 3)  Recent Labs    11/14/24 0026 11/14/24 0406 11/14/24 0807   GLUCAP 139* 140* 143*   Scheduled Meds:  aspirin  EC  81 mg Oral QHS   bisoprolol   2.5 mg Oral Daily   dapagliflozin  propanediol  10 mg Oral QAC breakfast   fluticasone  furoate-vilanterol  1 puff Inhalation Daily   guaiFENesin   10 mL Oral TID   insulin  aspart  0-6 Units Subcutaneous Q4H   ipratropium  0.5 mg Nebulization Q6H   magic mouthwash w/lidocaine   10 mL Oral QID   montelukast   10 mg Oral QHS   nystatin  5 mL Mouth/Throat QID   predniSONE   40 mg Oral Q breakfast   rosuvastatin   10 mg Oral Q M,W,F   sodium chloride  flush  3 mL Intravenous Q12H   Continuous Infusions:  ampicillin-sulbactam (UNASYN) IV 3 g (11/14/24 0639)   doxycycline  (VIBRAMYCIN ) IV 100 mg (11/14/24 0526)   PRN Meds:.acetaminophen  **OR** acetaminophen , albuterol , LORazepam , morphine  injection, oxyCODONE , prochlorperazine  Current Outpatient Medications  Medication Instructions   albuterol  (PROVENTIL ) 2.5 mg, Nebulization, Every 6 hours PRN   albuterol  (VENTOLIN  HFA) 108 (90 Base) MCG/ACT inhaler TAKE 2 PUFFS BY MOUTH EVERY 6 HOURS AS NEEDED FOR WHEEZE OR SHORTNESS OF BREATH   aspirin  EC 81 mg, Oral, Daily at bedtime, Swallow whole.   bisoprolol  (ZEBETA ) 2.5 mg, Oral, Daily   dapagliflozin  propanediol (FARXIGA ) 10 mg, Oral, Daily before breakfast   fluticasone -salmeterol (ADVAIR HFA) 230-21 MCG/ACT inhaler 2 puffs, Inhalation, 2 times daily   furosemide  (LASIX ) 20 mg, Oral, Daily   glipiZIDE  (GLUCOTROL ) 5-10 mg, Oral, 2 times daily before meals   HYDROcodone -acetaminophen  (NORCO) 7.5-325 MG tablet 1 tablet, Oral, Every 6 hours PRN   ipratropium (ATROVENT ) 0.02 % nebulizer solution TAKE 2.5 ML (0.5 MG TOTAL) BY NEBULIZATION 4 TIMES A DAY  LORazepam  (ATIVAN ) 1 MG tablet TAKE 1/2 TO 1 TABLET BY MOUTH NIGHTLY AS NEEDED.   metFORMIN  (GLUCOPHAGE ) 500 mg, Oral, 2 times daily with meals   mirtazapine  (REMERON ) 7.5-15 mg, Oral, Daily at bedtime   montelukast  (SINGULAIR ) 10 mg, Oral, Daily at bedtime    nitroGLYCERIN  (NITROSTAT ) 0.4 mg, Sublingual, Every 5 min x3 PRN   potassium chloride  20 MEQ TBCR 10 mEq, Oral, Daily   rosuvastatin  (CRESTOR ) 10 MG tablet TAKE 1 TABLET ON MONDAY, WEDNESDAY & FRIDAY.   spironolactone  (ALDACTONE ) 25 mg, Oral, Daily   traMADol  (ULTRAM ) 50-100 mg, Oral, Every 8 hours PRN   valsartan  (DIOVAN ) 20 mg, Oral, Daily   vitamin E (VITAMIN E) 400 Units, Oral, Daily    Diet Orders (From admission, onward)     Start     Ordered   11/14/24 0108  Diet NPO time specified  Diet effective now        11/14/24 0107            DVT prophylaxis:    Lab Results  Component Value Date   PLT 463 (H) 11/14/2024      Code Status: Full Code  Family Communication: No family at bedside  Status is: Inpatient Remains inpatient appropriate because: Severity of illness  Level of care: Telemetry  Consultants:  PCCM IR  Objective: Vitals:   11/14/24 0235 11/14/24 0402 11/14/24 0745 11/14/24 0829  BP:  106/73 100/64   Pulse:  64 60 65  Resp:  19 17 18   Temp:  97.9 F (36.6 C) 98 F (36.7 C)   TempSrc:  Oral Oral   SpO2: 96% 98% 99%   Weight:  65.1 kg    Height:  5' 9 (1.753 m)      Intake/Output Summary (Last 24 hours) at 11/14/2024 9076 Last data filed at 11/14/2024 9687 Gross per 24 hour  Intake 1178.25 ml  Output 200 ml  Net 978.25 ml   Wt Readings from Last 3 Encounters:  11/14/24 65.1 kg  10/30/24 66.2 kg  10/18/24 68.9 kg    Examination:  Constitutional: NAD Eyes: no scleral icterus ENMT: Mucous membranes are moist.  Neck: normal, supple Respiratory: Diminished at the left lung base, no wheezing Cardiovascular: Regular rate and rhythm, no murmurs / rubs / gallops. No LE edema.  Abdomen: non distended, no tenderness. Bowel sounds positive.  Musculoskeletal: no clubbing / cyanosis.   Data Reviewed: I have independently reviewed following labs and imaging studies   CBC Recent Labs  Lab 11/12/24 0241 11/12/24 0246 11/13/24 0221  11/14/24 0235  WBC 26.1*  --  23.3* 16.3*  HGB 12.0* 13.6 10.3* 9.6*  HCT 38.9* 40.0 32.2* 29.7*  PLT 628*  --  445* 463*  MCV 81.7  --  79.1* 78.2*  MCH 25.2*  --  25.3* 25.3*  MCHC 30.8  --  32.0 32.3  RDW 17.4*  --  17.0* 17.1*  LYMPHSABS 1.0  --   --   --   MONOABS 0.8  --   --   --   EOSABS 0.0  --   --   --   BASOSABS 0.0  --   --   --     Recent Labs  Lab 11/12/24 0241 11/12/24 0246 11/12/24 0402 11/12/24 0718 11/13/24 0221 11/14/24 0235  NA 127* 127*  --   --  132* 132*  K 5.4* 5.1  --   --  4.8 3.8  CL 91*  --   --   --  94* 96*  CO2 19*  --   --   --  28 28  GLUCOSE 155*  --   --   --  182* 138*  BUN 25*  --   --   --  26* 28*  CREATININE 1.07  --   --   --  1.16 0.93  CALCIUM  8.7*  --   --   --  8.5* 8.4*  AST  --   --   --   --  39  --   ALT  --   --   --   --  48*  --   ALKPHOS  --   --   --   --  111  --   BILITOT  --   --   --   --  0.5  --   ALBUMIN  --   --   --   --  1.7*  --   MG  --   --   --   --  1.9  --   LATICACIDVEN  --   --  3.1* 2.1*  --   --   BNP 524.6*  --   --   --   --   --     ------------------------------------------------------------------------------------------------------------------ No results for input(s): CHOL, HDL, LDLCALC, TRIG, CHOLHDL, LDLDIRECT in the last 72 hours.  Lab Results  Component Value Date   HGBA1C 5.9 (A) 08/07/2024   HGBA1C 5.9 08/07/2024   HGBA1C 5.9 (A) 08/07/2024   HGBA1C 5.9 08/07/2024   ------------------------------------------------------------------------------------------------------------------ No results for input(s): TSH, T4TOTAL, T3FREE, THYROIDAB in the last 72 hours.  Invalid input(s): FREET3  Cardiac Enzymes No results for input(s): CKMB, TROPONINI, MYOGLOBIN in the last 168 hours.  Invalid input(s): CK ------------------------------------------------------------------------------------------------------------------    Component Value Date/Time    BNP 524.6 (H) 11/12/2024 0241    CBG: Recent Labs  Lab 11/13/24 1600 11/13/24 2029 11/14/24 0026 11/14/24 0406 11/14/24 0807  GLUCAP 140* 159* 139* 140* 143*    Recent Results (from the past 240 hours)  Resp panel by RT-PCR (RSV, Flu A&B, Covid) Anterior Nasal Swab     Status: None   Collection Time: 11/12/24  2:41 AM   Specimen: Anterior Nasal Swab  Result Value Ref Range Status   SARS Coronavirus 2 by RT PCR NEGATIVE NEGATIVE Final   Influenza A by PCR NEGATIVE NEGATIVE Final   Influenza B by PCR NEGATIVE NEGATIVE Final    Comment: (NOTE) The Xpert Xpress SARS-CoV-2/FLU/RSV plus assay is intended as an aid in the diagnosis of influenza from Nasopharyngeal swab specimens and should not be used as a sole basis for treatment. Nasal washings and aspirates are unacceptable for Xpert Xpress SARS-CoV-2/FLU/RSV testing.  Fact Sheet for Patients: bloggercourse.com  Fact Sheet for Healthcare Providers: seriousbroker.it  This test is not yet approved or cleared by the [place]  FDA and has been authorized for detection and/or diagnosis of SARS-CoV-2 by FDA under an Emergency Use Authorization (EUA). This EUA will remain in effect (meaning this test can be used) for the duration of the COVID-19 declaration under Section 564(b)(1) of the Act, 21 U.S.C. section 360bbb-3(b)(1), unless the authorization is terminated or revoked.     Resp Syncytial Virus by PCR NEGATIVE NEGATIVE Final    Comment: (NOTE) Fact Sheet for Patients: bloggercourse.com  Fact Sheet for Healthcare Providers: seriousbroker.it  This test is not yet approved or cleared by the [place]  FDA and has been authorized for detection and/or diagnosis of SARS-CoV-2 by  FDA under an Emergency Use Authorization (EUA). This EUA will remain in effect (meaning this test can be used) for the duration of  the COVID-19 declaration under Section 564(b)(1) of the Act, 21 U.S.C. section 360bbb-3(b)(1), unless the authorization is terminated or revoked.  Performed at [facility], [address], [place], [place] [address]   Blood culture (routine x 2)     Status: None (Preliminary result)   Collection Time: 11/12/24  3:35 AM   Specimen: BLOOD RIGHT HAND  Result Value Ref Range Status   Specimen Description BLOOD RIGHT HAND  Final   Special Requests   Final    BOTTLES DRAWN AEROBIC AND ANAEROBIC Blood Culture adequate volume   Culture   Final    NO GROWTH 2 DAYS Performed at [facility], [address], [place], [place] [address]    Report Status PENDING  Incomplete  Blood culture (routine x 2)     Status: None (Preliminary result)   Collection Time: 11/12/24  4:11 AM   Specimen: BLOOD RIGHT ARM  Result Value Ref Range Status   Specimen Description BLOOD RIGHT ARM  Final   Special Requests   Final    BOTTLES DRAWN AEROBIC AND ANAEROBIC Blood Culture adequate volume   Culture   Final    NO GROWTH 2 DAYS Performed at [facility], [address] [place], [place] [address]    Report Status PENDING  Incomplete  Body fluid culture w Gram Stain     Status: None (Preliminary result)   Collection Time: 11/13/24 10:16 AM   Specimen: Lung, Left; Pleural Fluid  Result Value Ref Range Status   Specimen Description PLEURAL  Final   Special Requests LUNG,LEFT  Final   Gram Stain   Final    ABUNDANT WBC PRESENT, PREDOMINANTLY PMN ABUNDANT GRAM POSITIVE COCCI IN CHAINS Performed at [facility], [address] [place], [place] [address]    Culture PENDING  Incomplete   Report Status PENDING  Incomplete     Radiology Studies: IR THORACENTESIS ASP PLEURAL SPACE W/IMG GUIDE Result Date: 11/13/2024 INDICATION: Patient admitted with pneumonia and found to have a complex left pleural effusion. Interventional Radiology asked to perform a diagnostic and therapeutic  thoracentesis. EXAM: ULTRASOUND GUIDED THORACENTESIS MEDICATIONS: 1% lidocaine  10 mL COMPLICATIONS: None immediate. PROCEDURE: An ultrasound guided thoracentesis was thoroughly discussed with the patient and questions answered. The benefits, risks, alternatives and complications were also discussed. The patient understands and wishes to proceed with the procedure. Written consent was obtained. Ultrasound was performed to localize and mark an adequate pocket of fluid in the left chest. The area was then prepped and draped in the normal sterile fashion. 1% Lidocaine  was used for local anesthesia. Under ultrasound guidance a 6 Fr Safe-T-Centesis catheter was introduced. Thoracentesis was performed. The catheter was removed and a dressing applied. FINDINGS: A total of approximately 150 mL of green-tinged, purulent fluid fluid was removed. Samples were sent to the laboratory as requested by the clinical team. IMPRESSION: Successful ultrasound guided left thoracentesis yielding 150 mL of pleural fluid. Procedure performed by: Warren Dais, NP Electronically Signed   By: Juliene Balder M.D.   On: 11/13/2024 12:58   DG Chest 1 View Result Date: 11/13/2024 CLINICAL DATA:  Status post left thoracentesis. EXAM: CHEST  1 VIEW COMPARISON:  11/12/2024 FINDINGS: Normal sized heart. Increased patchy and consolidative density in the left lung. Decreased left pleural fluid. No pneumothorax. Clear right lung. Stable left  subclavian pacemaker leads. Thoracic spine degenerative changes. Mild-to-moderate left shoulder degenerative changes with superior migration of the humeral head compatible with a large, chronic rotator cuff tear. IMPRESSION: 1. No pneumothorax following left thoracentesis. 2. Increased left lung pneumonia. 3. Decreased left pleural fluid. Electronically Signed   By: Elspeth Bathe M.D.   On: 11/13/2024 10:49     Nilda Fendt, MD, PhD [facility]  Between 7 am - 7 pm I am available, please contact me  via Amion (for emergencies) or Securechat (non urgent messages)  Between 7 pm - 7 am I am not available, please contact night coverage MD/APP via Amion

## 2024-11-14 NOTE — Progress Notes (Signed)
 Mobility Specialist Progress Note:    11/14/24 1507  Mobility  Activity Dangled on edge of bed (Ankle Pumps, Leg Ext, Sitting marches)  Level of Assistance Standby assist, set-up cues, supervision of patient - no hands on  Assistive Device None  Range of Motion/Exercises Active  Activity Response Tolerated fair  Mobility Referral Yes  Mobility visit 1 Mobility  Mobility Specialist Start Time (ACUTE ONLY) 1507  Mobility Specialist Stop Time (ACUTE ONLY) 1513  Mobility Specialist Time Calculation (min) (ACUTE ONLY) 6 min   Received pt sitting on EOB agreeable to mobility. Pt c/o chest pain from recent chest tube. Pt able to perform movements decently w/ no assist. Left pt on EOB w/ all needs met.   Venetia Keel Mobility Specialist Please Neurosurgeon or Rehab Office at [phone]

## 2024-11-14 NOTE — Consult Note (Signed)
 PROGRESS NOTE   NAME:  Jeffery [name], MRN:  [id number], DOB:  27-Jun-1941, LOS: 2 ADMISSION DATE:  11/12/2024, CONSULTATION DATE:  12/2 REFERRING MD:  Dr. Fujisawa, CHIEF COMPLAINT:  pleural effusion   History of Present Illness:  Patient is a 83 yo M w/ pertinent PMH bronchiectasis, asthma, T2DM, type 2 2nd degree heart block s/p pacemaker placement, systolic chf, GERD presents to [facility] on 12/1 w/ pleural effusion.  Worsening dyspnea and cough over the past month. On 12/1 increased wob. EMS called found hypoxic and placed on supplemental o2 and given nebs. Taken to [facility] ED. On arrival sats 93% on 2 l/m [place]. Noted to have rales on left side. CXR showing LUL and LLL infiltrate. CT chest showing diffuse patchy infiltrates LUL and LLL; large left loculated pleural effusion concerning for empyema or parapneumonic effusion. Afebrile and wbc 26. Cultures obtained and started on broad spectrum abx. Swallow eval performed showing severe aspiration. Abx changed to unasyn  for possible aspiration pna. On 12/2 IR performed thora with only 150 green tinged purulent fluids. Pleural LDH >2,500. TNC count 1,970,000. PCCM consulted.  Pertinent  Medical History   Past Medical History:  Diagnosis Date   Arthritis    thumbs (07/25/2018)   Asthma    CHF (congestive heart failure) (HCC)    Chronic bronchitis (HCC)    Environmental allergies    GERD (gastroesophageal reflux disease)    Headache    seasonal; w/environmental allergies (07/25/2018)   History of blood transfusion    when I had laminectomy (07/25/2018)   HTN (hypertension)    Hyperlipidemia    LBBB (left bundle branch block) 1999   Metatarsal bone fracture 03/07/2018   Myocardial infarction [facility])    was told I've had an old MI; probably in the 1990s (07/25/2018)   Pneumonia    as a child, age 64; viral pneumonia 3 times in the last 10 years (07/25/2018)   Presence of permanent cardiac pacemaker 07/25/2018   Seasonal allergies    Sleep  apnea    wife says I do (07/25/2018)   Type II diabetes mellitus (HCC)      Significant Hospital Events: Including procedures, antibiotic start and stop dates in addition to other pertinent events   12/2- left thoracentesis 12/02- left sided chest tube placement   Interim History / Subjective:  Underwent chest tube placement this morning Thick cloudy output on the tubing  Objective    Blood pressure 103/71, pulse 83, temperature 98 F (36.7 C), temperature source Oral, resp. rate 18, height 5' 9 (1.753 m), weight 65.1 kg, SpO2 100%.    FiO2 (%):  [28 %] 28 %   Intake/Output Summary (Last 24 hours) at 11/14/2024 1432 Last data filed at 11/14/2024 9687 Gross per 24 hour  Intake 1001.25 ml  Output --  Net 1001.25 ml   Filed Weights   11/12/24 0559 11/13/24 0500 11/14/24 0402  Weight: 65.2 kg 65.6 kg 65.1 kg    Examination: Awake, alert, comfortable On room air Decreased breath sounds left chest   Resolved problem list   Assessment and Plan   Left sided pna Left sided empyema  Possible aspiration Hx of bronchiectasis and asthma -Pleural LDH >2,500. TNC count 1,970,000, purulent, GPC in chains in stain  Plan: -continue unasyn  and doxycycline  -follow pleural cultures/bacterial colonies present in cytology - please place chest tube to -20 suction; monitor outpt - flush with 20cc every 4 hrs - cxr in am - pulm toilet - cont home maintenance inhaler/nebs -  on steroids; consider stopping  - aspiration precautions. Advised pt to follow speech therapy recommendations   Discussed plan with the patient and Dr Trixie  Labs   CBC: Recent Labs  Lab 11/12/24 0241 11/12/24 0246 11/13/24 0221 11/14/24 0235  WBC 26.1*  --  23.3* 16.3*  NEUTROABS 24.3*  --   --   --   HGB 12.0* 13.6 10.3* 9.6*  HCT 38.9* 40.0 32.2* 29.7*  MCV 81.7  --  79.1* 78.2*  PLT 628*  --  445* 463*    Basic Metabolic Panel: Recent Labs  Lab 11/12/24 0241 11/12/24 0246  11/13/24 0221 11/14/24 0235  NA 127* 127* 132* 132*  K 5.4* 5.1 4.8 3.8  CL 91*  --  94* 96*  CO2 19*  --  28 28  GLUCOSE 155*  --  182* 138*  BUN 25*  --  26* 28*  CREATININE 1.07  --  1.16 0.93  CALCIUM  8.7*  --  8.5* 8.4*  MG  --   --  1.9  --    GFR: Estimated Creatinine Clearance: 57.4 mL/min (by C-G formula based on SCr of 0.93 mg/dL). Recent Labs  Lab 11/12/24 0241 11/12/24 0402 11/12/24 0718 11/13/24 0221 11/14/24 0235  WBC 26.1*  --   --  23.3* 16.3*  LATICACIDVEN  --  3.1* 2.1*  --   --     Liver Function Tests: Recent Labs  Lab 11/13/24 0221  AST 39  ALT 48*  ALKPHOS 111  BILITOT 0.5  PROT 5.9*  ALBUMIN 1.7*   No results for input(s): LIPASE, AMYLASE in the last 168 hours. No results for input(s): AMMONIA in the last 168 hours.  ABG    Component Value Date/Time   HCO3 23.4 11/12/2024 0246   TCO2 24 11/12/2024 0246   O2SAT 56 11/12/2024 0246     Coagulation Profile: Recent Labs  Lab 11/14/24 0939  INR 1.2    Cardiac Enzymes: No results for input(s): CKTOTAL, CKMB, CKMBINDEX, TROPONINI in the last 168 hours.  HbA1C: Hemoglobin A1C  Date/Time Value Ref Range Status  08/07/2024 09:14 AM 5.9 (A) 4.0 - 5.6 % Final   HbA1c, POC (prediabetic range)  Date/Time Value Ref Range Status  08/07/2024 09:14 AM 5.9 (A) 5.7 - 6.4 % Final   HbA1c, POC (controlled diabetic range)  Date/Time Value Ref Range Status  08/07/2024 09:14 AM 5.9 0.0 - 7.0 % Final   HbA1c POC (<> result, manual entry)  Date/Time Value Ref Range Status  08/07/2024 09:14 AM 5.9 4.0 - 5.6 % Final   Hgb A1c MFr Bld  Date/Time Value Ref Range Status  02/20/2024 10:37 AM 6.7 (H) 4.6 - 6.5 % Final    Comment:    Glycemic Control Guidelines for People with Diabetes:Non Diabetic:  <6%Goal of Therapy: <7%Additional Action Suggested:  >8%   08/23/2023 10:10 AM 6.6 (H) 4.6 - 6.5 % Final    Comment:    Glycemic Control Guidelines for People with Diabetes:Non Diabetic:   <6%Goal of Therapy: <7%Additional Action Suggested:  >8%     CBG: Recent Labs  Lab 11/13/24 2029 11/14/24 0026 11/14/24 0406 11/14/24 0807 11/14/24 1317  GLUCAP 159* 139* 140* 143* 116*    Review of Systems:   Review of Systems  Respiratory:  Positive for cough and shortness of breath.   Cardiovascular:  Negative for chest pain.  Gastrointestinal:  Negative for abdominal pain, nausea and vomiting.   Past Medical History:  He,  has a past medical  history of Arthritis, Asthma, CHF (congestive heart failure) (HCC), Chronic bronchitis (HCC), Environmental allergies, GERD (gastroesophageal reflux disease), Headache, History of blood transfusion, HTN (hypertension), Hyperlipidemia, LBBB (left bundle branch block) (1999), Metatarsal bone fracture (03/07/2018), Myocardial infarction (HCC), Pneumonia, Presence of permanent cardiac pacemaker (07/25/2018), Seasonal allergies, Sleep apnea, and Type II diabetes mellitus (HCC).   Surgical History:   Past Surgical History:  Procedure Laterality Date   BACK SURGERY     BIV PACEMAKER INSERTION CRT-P  07/25/2018   BIV PACEMAKER INSERTION CRT-P N/A 07/25/2018   Procedure: BIV PACEMAKER INSERTION CRT-P;  Surgeon: Waddell Danelle ORN, MD;  Location: [facility] INVASIVE CV LAB;  Service: Cardiovascular;  Laterality: N/A;   CARDIAC CATHETERIZATION  2003   Dr Esmeralda Sharps; 85 % R circumflex obstruction   CARPAL TUNNEL RELEASE Left 10/11/2014   Procedure: LEFT CARPAL TUNNEL RELEASE;  Surgeon: Elsie Mussel, MD;  Location: [facility];  Service: Orthopedics;  Laterality: Left;   COLONOSCOPY W/ BIOPSIES AND POLYPECTOMY  2018   INGUINAL HERNIA REPAIR Right 1948   INGUINAL HERNIA REPAIR Left 1988   IR THORACENTESIS ASP PLEURAL SPACE W/IMG GUIDE  11/13/2024   LUMBAR LAMINECTOMY  1984   MINOR CARPAL TUNNEL Right 11/22/2014   Procedure: RIGHT LIMITED OPEN CARPAL TUNNEL RELEASE;  Surgeon: Elsie Mussel, MD;  Location:  SURGERY CENTER;  Service:  Orthopedics;  Laterality: Right;   RIGHT/LEFT HEART CATH AND CORONARY ANGIOGRAPHY N/A 10/08/2024   Procedure: RIGHT/LEFT HEART CATH AND CORONARY ANGIOGRAPHY;  Surgeon: Rolan Ezra RAMAN, MD;  Location: [facility] INVASIVE CV LAB;  Service: Cardiovascular;  Laterality: N/A;   TONSILLECTOMY  1958     Social History:   reports that he quit smoking about 60 years ago. His smoking use included cigarettes. He started smoking about 62 years ago. He has a 4 pack-year smoking history. He has never used smokeless tobacco. He reports current alcohol use of about 2.0 standard drinks of alcohol per week. He reports that he does not use drugs.   Family History:  His family history includes Colon polyps in his father; Diabetes in his maternal aunt; Heart attack in his father; Heart failure in his mother; Hypertension in his mother; Subarachnoid hemorrhage (age of onset: 62) in his mother; Subarachnoid hemorrhage (age of onset: 64) in his paternal grandfather. There is no history of Colon cancer.   Allergies Allergies  Allergen Reactions   Contrast Media [Iodinated Contrast Media] Other (See Comments)    Redness and warm sensation, this reaction was noted on 02/27/13 during a Cardiac MRI per pt.  Pt sts he had erythema on his head, chest, and shoulders.  Pt had a pacemaker placed August 2019 and was pre-medicated for the dye and had no allergic reaction at that time. -Carissa Mozingo B.S. RT(R)(CT)     Home Medications  Prior to Admission medications   Medication Sig Start Date End Date Taking? Authorizing Provider  albuterol  (PROVENTIL ) (2.5 MG/3ML) 0.083% nebulizer solution Take 3 mLs (2.5 mg total) by nebulization every 6 (six) hours as needed for wheezing or shortness of breath. 04/24/24  Yes Burns, Glade PARAS, MD  albuterol  (VENTOLIN  HFA) 108 (90 Base) MCG/ACT inhaler TAKE 2 PUFFS BY MOUTH EVERY 6 HOURS AS NEEDED FOR WHEEZE OR SHORTNESS OF BREATH 06/21/22  Yes Burns, Glade PARAS, MD  aspirin  EC 81 MG tablet Take 1 tablet  (81 mg total) by mouth at bedtime. Swallow whole. 12/05/23   Danford, Lonni SQUIBB, MD  bisoprolol  (ZEBETA ) 5 MG tablet Take 0.5 tablets (  2.5 mg total) by mouth daily. 09/12/24   Rolan Ezra RAMAN, MD  dapagliflozin  propanediol (FARXIGA ) 10 MG TABS tablet Take 1 tablet (10 mg total) by mouth daily before breakfast. 12/20/23   Court Dorn PARAS, MD  fluticasone -salmeterol (ADVAIR HFA) 230-21 MCG/ACT inhaler Inhale 2 puffs into the lungs 2 (two) times daily. 08/07/24   Geofm Glade PARAS, MD  furosemide  (LASIX ) 40 MG tablet Take 0.5 tablets (20 mg total) by mouth daily. 11/07/24   Rolan Ezra RAMAN, MD  glipiZIDE  (GLUCOTROL ) 5 MG tablet TAKE 1-2 TABLETS (5-10 MG TOTAL) BY MOUTH 2 (TWO) TIMES DAILY BEFORE A MEAL. 05/11/24   Geofm Glade PARAS, MD  HYDROcodone -acetaminophen  (NORCO) 7.5-325 MG tablet Take 1 tablet by mouth every 6 (six) hours as needed for severe pain (pain score 7-10) (for chronic joint pain, neck pain). 11/07/24   Geofm Glade PARAS, MD  ipratropium (ATROVENT ) 0.02 % nebulizer solution TAKE 2.5 ML (0.5 MG TOTAL) BY NEBULIZATION 4 TIMES A DAY 08/31/24   Burns, Glade PARAS, MD  LORazepam  (ATIVAN ) 1 MG tablet TAKE 1/2 TO 1 TABLET BY MOUTH NIGHTLY AS NEEDED. 05/18/24   Burns, Glade PARAS, MD  metFORMIN  (GLUCOPHAGE ) 500 MG tablet TAKE 1 TABLET BY MOUTH TWICE A DAY WITH FOOD 11/12/24   Geofm Glade PARAS, MD  mirtazapine  (REMERON ) 7.5 MG tablet Take 1-2 tablets (7.5-15 mg total) by mouth at bedtime. 10/30/24   Geofm Glade PARAS, MD  montelukast  (SINGULAIR ) 10 MG tablet Take 1 tablet (10 mg total) by mouth at bedtime. 02/29/24   Geofm Glade PARAS, MD  nitroGLYCERIN  (NITROSTAT ) 0.4 MG SL tablet Place 1 tablet (0.4 mg total) under the tongue every 5 (five) minutes x 3 doses as needed for chest pain. 03/22/23   Hobart Powell BRAVO, MD  potassium chloride  20 MEQ TBCR Take 0.5 tablets (10 mEq total) by mouth daily. 07/19/24   Court Dorn PARAS, MD  rosuvastatin  (CRESTOR ) 10 MG tablet TAKE 1 TABLET ON MONDAY, WEDNESDAY & FRIDAY. 12/20/23   Court Dorn PARAS, MD  spironolactone  (ALDACTONE ) 25 MG tablet Take 1 tablet (25 mg total) by mouth daily. 10/18/24   Rolan Ezra RAMAN, MD  valsartan  (DIOVAN ) 40 MG tablet Take 0.5 tablets (20 mg total) by mouth daily. 10/18/24   Rolan Ezra RAMAN, MD  vardenafil  (LEVITRA ) 20 MG tablet TAKE 1 TABLET BY MOUTH AS DIRECTED. CAN NOT BE TAKEN WITH NITROGLYCERIN . NOT COVERED BY INSURANCE 10/13/23   Geofm Glade PARAS, MD     Will follow along

## 2024-11-14 NOTE — Evaluation (Addendum)
 Physical Therapy Evaluation Patient Details Name: Jeffery [name] MRN: [id number] DOB: Jun 25, 1941 Today's Date: 11/14/2024  History of Present Illness  83 year old male admitted 12/1 with complaints of cough, dyspnea, malaise for months.   He was found to have LLL pneumonia with a pleural effusion, and upon sampling it turned to be purulent.  Thoracentesis 12/1.  PMH: asthma, bronchiectasis, DM2, heart block status post pacemaker, chronic systolic CHF  Clinical Impression  Pt admitted with above diagnosis. Pt tolerated rx well overall. No issues with safety with RW.  Desaturation on 2LO2 needing up to 4LO2 to keep sats > 88%.  Pt's greatest challenge is 32 steps once in home. Discussed using cane to go up and down the stairs and keep the rollator on the main level he stays on. Rollator will benefit pt so he can rest when needed.  Will work on teaching laboratory technician with pt. Will follow acutely and progress pt as able. Likely will benefit from HHPT f/u.   Pt currently with functional limitations due to the deficits listed below (see PT Problem List). Pt will benefit from acute skilled PT to increase their independence and safety with mobility to allow discharge.           If plan is discharge home, recommend the following: A little help with walking and/or transfers;A little help with bathing/dressing/bathroom;Assistance with cooking/housework;Assist for transportation;Help with stairs or ramp for entrance   Can travel by private vehicle        Equipment Recommendations Rollator (4 wheels)  Recommendations for Other Services       Functional Status Assessment Patient has had a recent decline in their functional status and demonstrates the ability to make significant improvements in function in a reasonable and predictable amount of time.     Precautions / Restrictions Precautions Precautions: Fall Restrictions Weight Bearing Restrictions Per Provider Order: No      Mobility  Bed  Mobility Overal bed mobility: Needs Assistance Bed Mobility: Supine to Sit     Supine to sit: Contact guard          Transfers Overall transfer level: Needs assistance Equipment used: Rolling walker (2 wheels) Transfers: Sit to/from Stand Sit to Stand: Contact guard assist           General transfer comment: cues for hand placement    Ambulation/Gait Ambulation/Gait assistance: Contact guard assist Gait Distance (Feet): 120 Feet Assistive device: Rolling walker (2 wheels) Gait Pattern/deviations: Step-through pattern, Decreased stride length   Gait velocity interpretation: 1.31 - 2.62 ft/sec, indicative of limited community ambulator   General Gait Details: Pt took a few standing rest breaks at times with pt on 2LO2 with sats >90%.  Pt desaturated to 84% on 2L once he was ambulating and needed 4LO2 to keep sats > 88%.  REcovered at the end of rx and left pt on 2LO2 at rest.  No LOB with activity and overall good safety with RW.  Stairs            Wheelchair Mobility     Tilt Bed    Modified Rankin (Stroke Patients Only)       Balance Overall balance assessment: Needs assistance Sitting-balance support: No upper extremity supported, Feet supported Sitting balance-Leahy Scale: Fair     Standing balance support: Bilateral upper extremity supported, During functional activity, Reliant on assistive device for balance Standing balance-Leahy Scale: Poor  Pertinent Vitals/Pain Pain Assessment Pain Assessment: No/denies pain    Home Living Family/patient expects to be discharged to:: Private residence Living Arrangements: Spouse/significant other Available Help at Discharge: Family;Available 24 hours/day Type of Home: House Home Access: Stairs to enter Entrance Stairs-Rails: None Entrance Stairs-Number of Steps: 8 Alternate Level Stairs-Number of Steps: 32 Home Layout: Two level Home Equipment: Cane - single  point;Grab bars - tub/shower      Prior Function               Mobility Comments: Ambulated with cane Modif I last few weeks ADLs Comments: B/D self, pt helps wife with pressure socks and wife helps pt prn     Extremity/Trunk Assessment   Upper Extremity Assessment Upper Extremity Assessment: Defer to OT evaluation    Lower Extremity Assessment Lower Extremity Assessment: Generalized weakness    Cervical / Trunk Assessment Cervical / Trunk Assessment: Normal  Communication   Communication Communication: No apparent difficulties    Cognition Arousal: Alert Behavior During Therapy: WFL for tasks assessed/performed   PT - Cognitive impairments: No apparent impairments                         Following commands: Intact       Cueing       General Comments General comments (skin integrity, edema, etc.): 67 bpm-81 bpm;  See above for O2 saturation    Exercises General Exercises - Lower Extremity Ankle Circles/Pumps: AROM, Both, 10 reps, Supine Long Arc Quad: AROM, Both, 10 reps, Seated Hip Flexion/Marching: AROM, Both, 10 reps, Seated   Assessment/Plan    PT Assessment Patient needs continued PT services  PT Problem List Decreased activity tolerance;Decreased balance;Decreased mobility;Decreased knowledge of use of DME;Decreased safety awareness;Decreased knowledge of precautions;Cardiopulmonary status limiting activity       PT Treatment Interventions DME instruction;Gait training;Stair training;Functional mobility training;Therapeutic activities;Therapeutic exercise;Balance training;Patient/family education    PT Goals (Current goals can be found in the Care Plan section)  Acute Rehab PT Goals Patient Stated Goal: to get stronger PT Goal Formulation: With patient Time For Goal Achievement: 11/28/24 Potential to Achieve Goals: Good    Frequency Min 2X/week     Co-evaluation               AM-PAC PT 6 Clicks Mobility  Outcome  Measure Help needed turning from your back to your side while in a flat bed without using bedrails?: A Little Help needed moving from lying on your back to sitting on the side of a flat bed without using bedrails?: A Little Help needed moving to and from a bed to a chair (including a wheelchair)?: A Little Help needed standing up from a chair using your arms (e.g., wheelchair or bedside chair)?: A Little Help needed to walk in hospital room?: A Little Help needed climbing 3-5 steps with a railing? : A Lot 6 Click Score: 17    End of Session Equipment Utilized During Treatment: Gait belt;Oxygen Activity Tolerance: Patient limited by fatigue Patient left: in chair;with call bell/phone within reach;with chair alarm set Nurse Communication: Mobility status PT Visit Diagnosis: Muscle weakness (generalized) (M62.81)    Time: 9065-8981 PT Time Calculation (min) (ACUTE ONLY): 44 min   Charges:   PT Evaluation $PT Eval Moderate Complexity: 1 Mod PT Treatments $Gait Training: 8-22 mins $Therapeutic Exercise: 8-22 mins PT General Charges $$ ACUTE PT VISIT: 1 Visit         Shawntrice Salle M,PT Acute Rehab Services  [phone]   Stephane JULIANNA Bevel 11/14/2024, 1:20 PM

## 2024-11-15 ENCOUNTER — Inpatient Hospital Stay (HOSPITAL_COMMUNITY)

## 2024-11-15 DIAGNOSIS — J189 Pneumonia, unspecified organism: Secondary | ICD-10-CM | POA: Diagnosis not present

## 2024-11-15 LAB — COMPREHENSIVE METABOLIC PANEL WITH GFR
ALT: 57 U/L — ABNORMAL HIGH (ref 0–44)
AST: 66 U/L — ABNORMAL HIGH (ref 15–41)
Albumin: 1.7 g/dL — ABNORMAL LOW (ref 3.5–5.0)
Alkaline Phosphatase: 103 U/L (ref 38–126)
Anion gap: 8 (ref 5–15)
BUN: 26 mg/dL — ABNORMAL HIGH (ref 8–23)
CO2: 28 mmol/L (ref 22–32)
Calcium: 8.3 mg/dL — ABNORMAL LOW (ref 8.9–10.3)
Chloride: 100 mmol/L (ref 98–111)
Creatinine, Ser: 0.93 mg/dL (ref 0.61–1.24)
GFR, Estimated: >60 mL/min (ref >60)
Glucose, Bld: 129 mg/dL — ABNORMAL HIGH (ref 70–99)
Potassium: 4.1 mmol/L (ref 3.5–5.1)
Sodium: 136 mmol/L (ref 135–145)
Total Bilirubin: 0.5 mg/dL (ref 0.0–1.2)
Total Protein: 5.3 g/dL — ABNORMAL LOW (ref 6.5–8.1)

## 2024-11-15 LAB — GLUCOSE, CAPILLARY
Glucose-Capillary: 150 mg/dL — ABNORMAL HIGH (ref 70–99)
Glucose-Capillary: 121 mg/dL — ABNORMAL HIGH (ref 70–99)
Glucose-Capillary: 186 mg/dL — ABNORMAL HIGH (ref 70–99)
Glucose-Capillary: 182 mg/dL — ABNORMAL HIGH (ref 70–99)
Glucose-Capillary: 120 mg/dL — ABNORMAL HIGH (ref 70–99)
Glucose-Capillary: 135 mg/dL — ABNORMAL HIGH (ref 70–99)
Glucose-Capillary: 138 mg/dL — ABNORMAL HIGH (ref 70–99)

## 2024-11-15 LAB — CBC
HCT: 33.3 % — ABNORMAL LOW (ref 39.0–52.0)
Hemoglobin: 10.4 g/dL — ABNORMAL LOW (ref 13.0–17.0)
MCH: 25.1 pg — ABNORMAL LOW (ref 26.0–34.0)
MCHC: 31.2 g/dL (ref 30.0–36.0)
MCV: 80.4 fL (ref 80.0–100.0)
Platelets: 518 K/uL — ABNORMAL HIGH (ref 150–400)
RBC: 4.14 MIL/uL — ABNORMAL LOW (ref 4.22–5.81)
RDW: 17.3 % — ABNORMAL HIGH (ref 11.5–15.5)
WBC: 12.9 K/uL — ABNORMAL HIGH (ref 4.0–10.5)
nRBC: 0 % (ref 0.0–0.2)

## 2024-11-15 LAB — CBC WITH DIFFERENTIAL/PLATELET
Abs Immature Granulocytes: 0.11 K/uL — ABNORMAL HIGH (ref 0.00–0.07)
Basophils Absolute: 0 K/uL (ref 0.0–0.1)
Basophils Relative: 0 %
Eosinophils Absolute: 0 K/uL (ref 0.0–0.5)
Eosinophils Relative: 0 %
HCT: 33.8 % — ABNORMAL LOW (ref 39.0–52.0)
Hemoglobin: 10.5 g/dL — ABNORMAL LOW (ref 13.0–17.0)
Immature Granulocytes: 1 %
Lymphocytes Relative: 5 %
Lymphs Abs: 0.5 K/uL — ABNORMAL LOW (ref 0.7–4.0)
MCH: 25.2 pg — ABNORMAL LOW (ref 26.0–34.0)
MCHC: 31.1 g/dL (ref 30.0–36.0)
MCV: 81.3 fL (ref 80.0–100.0)
Monocytes Absolute: 0.9 K/uL (ref 0.1–1.0)
Monocytes Relative: 9 %
Neutro Abs: 7.7 K/uL (ref 1.7–7.7)
Neutrophils Relative %: 85 %
Platelets: 526 K/uL — ABNORMAL HIGH (ref 150–400)
RBC: 4.16 MIL/uL — ABNORMAL LOW (ref 4.22–5.81)
RDW: 17.2 % — ABNORMAL HIGH (ref 11.5–15.5)
WBC: 9.2 K/uL (ref 4.0–10.5)
nRBC: 0 % (ref 0.0–0.2)

## 2024-11-15 LAB — PHOSPHORUS: Phosphorus: 2.8 mg/dL (ref 2.5–4.6)

## 2024-11-15 LAB — MAGNESIUM: Magnesium: 2 mg/dL (ref 1.7–2.4)

## 2024-11-15 MED ORDER — SODIUM CHLORIDE 0.9% FLUSH
10.0000 mL | Freq: Four times a day (QID) | INTRAVENOUS | Status: DC
  Administered 2024-11-15 – 2024-11-19 (×14): 10 mL via INTRAPLEURAL

## 2024-11-15 MED ORDER — STERILE WATER FOR INJECTION IJ SOLN
5.0000 mg | Freq: Once | RESPIRATORY_TRACT | Status: AC
  Administered 2024-11-15: 5 mg via INTRAPLEURAL
  Filled 2024-11-15: qty 5

## 2024-11-15 MED ORDER — ENSURE PLUS HIGH PROTEIN PO LIQD
237.0000 mL | Freq: Two times a day (BID) | ORAL | Status: DC
  Administered 2024-11-15 – 2024-11-18 (×7): 237 mL via ORAL

## 2024-11-15 MED ORDER — SODIUM CHLORIDE (PF) 0.9 % IJ SOLN
10.0000 mg | Freq: Once | INTRAMUSCULAR | Status: AC
  Administered 2024-11-15: 10 mg via INTRAPLEURAL
  Filled 2024-11-15: qty 10

## 2024-11-15 MED ORDER — POLYETHYLENE GLYCOL 3350 17 G PO PACK
17.0000 g | PACK | Freq: Every day | ORAL | Status: DC
  Administered 2024-11-15 – 2024-11-17 (×3): 17 g via ORAL
  Filled 2024-11-15 (×5): qty 1

## 2024-11-15 MED ORDER — SODIUM CHLORIDE 0.9% FLUSH
10.0000 mL | Freq: Three times a day (TID) | INTRAVENOUS | Status: DC
  Administered 2024-11-15: 10 mL via INTRAPLEURAL

## 2024-11-15 NOTE — Plan of Care (Signed)

## 2024-11-15 NOTE — Procedures (Signed)
 Pleural Fibrinolytic Administration Procedure Note  Jeffery [name]  [id number]  1941/11/05  Date:11/15/24  Time:2:51 PM   Provider Performing:Milaina Sher   Procedure: Pleural Fibrinolysis Initial day [id number])  Indication(s) Fibrinolysis of complicated pleural effusion  Consent Risks of the procedure as well as the alternatives and risks of each were explained to the patient and/or caregiver.  Consent for the procedure was obtained.   Anesthesia None   Time Out Verified patient identification, verified procedure, site/side was marked, verified correct patient position, special equipment/implants available, medications/allergies/relevant history reviewed, required imaging and test results available.   Sterile Technique Hand hygiene, gloves   Procedure Description Existing pleural catheter was cleaned and accessed in sterile manner.  10mg  of tPA in 30cc of saline and 5mg  of dornase in 30cc of sterile water  were injected into pleural space using existing pleural catheter.  Catheter will be clamped for 1 hour and then placed back to suction.   Complications/Tolerance None; patient tolerated the procedure well.  EBL None   Specimen(s) None

## 2024-11-15 NOTE — Progress Notes (Signed)
 PROGRESS NOTE  Jeffery [name] FMW:[id number] DOB: [birth year] DOA: 11/12/2024 PCP: Geofm Glade PARAS, MD   LOS: 3 days   Brief Narrative / Interim history: 83 year old male with history of asthma, bronchiectasis, DM2, heart block status post pacemaker, chronic systolic CHF who comes into the hospital with complaints of cough, dyspnea, malaise ongoing for the last several weeks if not months.  He has been having poor p.o. intake and has lost a lot of weight during this period of time.  He was found to have pneumonia with a pleural effusion, and upon sampling it turned to be purulent and pulmonary was consulted.  Subjective / 24h Interval events: Has limited mobility due to presence of chest tube, he is afraid not to interfere with it.  Assesement and Plan: Principal problem Left upper lobe pneumonia, empyema -underwent thoracentesis 12/2, found to have purulent fluid.  He is now status post IR guided chest tube placement.  Pulmonary following, appreciate input - Cultures with GPC's in chains, speciation pending - 550 cc of purulent output from his chest tube  Active problems Chronic systolic CHF -2D echo with LVEF 25-30%, global hypokinesis, mild LVH, RV systolic function with moderate reduction and enlargement.  Appears fairly euvolemic - Continue dapagliflozin , bisoprolol , statin.  No chest pain  Heart block-status post pacemaker  Oral thrush-continue nystatin   Diarrhea-possibly side effect from antibiotics.  Placed on Florastor  Hyperlipidemia-continue statin  History of a flutter - not on anticoagulation, telemetry showed paced rhythm.  Continue bisoprolol  and aspirin   History of bronchiectasis/asthma -no wheezing, felt to have exacerbation on admission and placed on prednisone , continue along with bronchodilators.  Hyponatremia-mild, monitor, no large shifts noted, sodium actually normalized today  DM2 -continue sliding scale  CBG (last 3)  Recent Labs    11/15/24 0004  11/15/24 0344 11/15/24 0739  GLUCAP 150* 121* 186*   Scheduled Meds:  aspirin  EC  81 mg Oral QHS   bisoprolol   2.5 mg Oral Daily   dapagliflozin  propanediol  10 mg Oral QAC breakfast   feeding supplement  237 mL Oral BID BM   fluticasone  furoate-vilanterol  1 puff Inhalation Daily   guaiFENesin   10 mL Oral TID   insulin  aspart  0-6 Units Subcutaneous Q4H   ipratropium  0.5 mg Nebulization Q6H   magic mouthwash w/lidocaine   10 mL Oral QID   montelukast   10 mg Oral QHS   nystatin   5 mL Mouth/Throat QID   polyethylene glycol  17 g Oral Daily   rosuvastatin   10 mg Oral Q M,W,F   saccharomyces boulardii  250 mg Oral BID   sodium chloride  flush  10 mL Intrapleural Q8H   sodium chloride  flush  20 mL Intravenous Q4H   sodium chloride  flush  3 mL Intravenous Q12H   sodium chloride  flush  5 mL Intracatheter Q8H   Continuous Infusions:  ampicillin -sulbactam (UNASYN ) IV 3 g (11/15/24 0620)   doxycycline  (VIBRAMYCIN ) IV 100 mg (11/15/24 0618)   PRN Meds:.acetaminophen  **OR** acetaminophen , albuterol , LORazepam , morphine  injection, oxyCODONE , prochlorperazine   Current Outpatient Medications  Medication Instructions   albuterol  (PROVENTIL ) 2.5 mg, Nebulization, Every 6 hours PRN   albuterol  (VENTOLIN  HFA) 108 (90 Base) MCG/ACT inhaler TAKE 2 PUFFS BY MOUTH EVERY 6 HOURS AS NEEDED FOR WHEEZE OR SHORTNESS OF BREATH   aspirin  EC 81 mg, Oral, Daily at bedtime, Swallow whole.   bisoprolol  (ZEBETA ) 2.5 mg, Oral, Daily   dapagliflozin  propanediol (FARXIGA ) 10 mg, Oral, Daily before breakfast   fluticasone -salmeterol (ADVAIR HFA) 230-21 MCG/ACT inhaler  2 puffs, Inhalation, 2 times daily   furosemide  (LASIX ) 20 mg, Oral, Daily   glipiZIDE  (GLUCOTROL ) 5-10 mg, Oral, 2 times daily before meals   HYDROcodone -acetaminophen  (NORCO) 7.5-325 MG tablet 1 tablet, Oral, Every 6 hours PRN   ipratropium (ATROVENT ) 0.02 % nebulizer solution TAKE 2.5 ML (0.5 MG TOTAL) BY NEBULIZATION 4 TIMES A DAY   LORazepam   (ATIVAN ) 1 MG tablet TAKE 1/2 TO 1 TABLET BY MOUTH NIGHTLY AS NEEDED.   metFORMIN  (GLUCOPHAGE ) 500 mg, Oral, 2 times daily with meals   mirtazapine  (REMERON ) 7.5-15 mg, Oral, Daily at bedtime   montelukast  (SINGULAIR ) 10 mg, Oral, Daily at bedtime   nitroGLYCERIN  (NITROSTAT ) 0.4 mg, Sublingual, Every 5 min x3 PRN   potassium chloride  20 MEQ TBCR 10 mEq, Oral, Daily   rosuvastatin  (CRESTOR ) 10 MG tablet TAKE 1 TABLET ON MONDAY, WEDNESDAY & FRIDAY.   spironolactone  (ALDACTONE ) 25 mg, Oral, Daily   traMADol  (ULTRAM ) 50-100 mg, Oral, Every 8 hours PRN   valsartan  (DIOVAN ) 20 mg, Oral, Daily   vitamin E (VITAMIN E) 400 Units, Oral, Daily    Diet Orders (From admission, onward)     Start     Ordered   11/14/24 1305  Diet Heart Room service appropriate? Yes; Fluid consistency: Thin  Diet effective now       Question Answer Comment  Room service appropriate? Yes   Fluid consistency: Thin      11/14/24 1304            DVT prophylaxis:    Lab Results  Component Value Date   PLT 518 (H) 11/15/2024      Code Status: Full Code  Family Communication: No family at bedside  Status is: Inpatient Remains inpatient appropriate because: Severity of illness  Level of care: Telemetry  Consultants:  PCCM IR  Objective: Vitals:   11/15/24 0300 11/15/24 0434 11/15/24 0600 11/15/24 0730  BP: (!) 91/58  110/67 99/64  Pulse: 60  66 61  Resp: 18   17  Temp: 97.6 F (36.4 C)   97.7 F (36.5 C)  TempSrc: Oral   Oral  SpO2: 98%  96% 98%  Weight:  66.5 kg    Height:  5' 9 (1.753 m)      Intake/Output Summary (Last 24 hours) at 11/15/2024 1039 Last data filed at 11/15/2024 0620 Gross per 24 hour  Intake 964.65 ml  Output 550 ml  Net 414.65 ml   Wt Readings from Last 3 Encounters:  11/15/24 66.5 kg  10/30/24 66.2 kg  10/18/24 68.9 kg    Examination:  Constitutional: NAD Eyes: lids and conjunctivae normal, no scleral icterus ENMT: mmm Neck: normal, supple Respiratory:  clear to auscultation bilaterally, no wheezing, no crackles. Normal respiratory effort.  Cardiovascular: Regular rate and rhythm, no murmurs / rubs / gallops. No LE edema. Abdomen: soft, no distention, no tenderness. Bowel sounds positive.   Data Reviewed: I have independently reviewed following labs and imaging studies   CBC Recent Labs  Lab 11/12/24 0241 11/12/24 0246 11/13/24 0221 11/14/24 0235 11/15/24 0237  WBC 26.1*  --  23.3* 16.3* 12.9*  HGB 12.0* 13.6 10.3* 9.6* 10.4*  HCT 38.9* 40.0 32.2* 29.7* 33.3*  PLT 628*  --  445* 463* 518*  MCV 81.7  --  79.1* 78.2* 80.4  MCH 25.2*  --  25.3* 25.3* 25.1*  MCHC 30.8  --  32.0 32.3 31.2  RDW 17.4*  --  17.0* 17.1* 17.3*  LYMPHSABS 1.0  --   --   --   --  MONOABS 0.8  --   --   --   --   EOSABS 0.0  --   --   --   --   BASOSABS 0.0  --   --   --   --     Recent Labs  Lab 11/12/24 0241 11/12/24 0246 11/12/24 0402 11/12/24 0718 11/13/24 0221 11/14/24 0235 11/14/24 0939 11/15/24 0237  NA 127* 127*  --   --  132* 132*  --  136  K 5.4* 5.1  --   --  4.8 3.8  --  4.1  CL 91*  --   --   --  94* 96*  --  100  CO2 19*  --   --   --  28 28  --  28  GLUCOSE 155*  --   --   --  182* 138*  --  129*  BUN 25*  --   --   --  26* 28*  --  26*  CREATININE 1.07  --   --   --  1.16 0.93  --  0.93  CALCIUM  8.7*  --   --   --  8.5* 8.4*  --  8.3*  AST  --   --   --   --  39  --   --  66*  ALT  --   --   --   --  48*  --   --  57*  ALKPHOS  --   --   --   --  111  --   --  103  BILITOT  --   --   --   --  0.5  --   --  0.5  ALBUMIN  --   --   --   --  1.7*  --   --  1.7*  MG  --   --   --   --  1.9  --   --  2.0  LATICACIDVEN  --   --  3.1* 2.1*  --   --   --   --   INR  --   --   --   --   --   --  1.2  --   BNP 524.6*  --   --   --   --   --   --   --     ------------------------------------------------------------------------------------------------------------------ No results for input(s): CHOL, HDL, LDLCALC, TRIG,  CHOLHDL, LDLDIRECT in the last 72 hours.  Lab Results  Component Value Date   HGBA1C 5.9 (A) 08/07/2024   HGBA1C 5.9 08/07/2024   HGBA1C 5.9 (A) 08/07/2024   HGBA1C 5.9 08/07/2024   ------------------------------------------------------------------------------------------------------------------ No results for input(s): TSH, T4TOTAL, T3FREE, THYROIDAB in the last 72 hours.  Invalid input(s): FREET3  Cardiac Enzymes No results for input(s): CKMB, TROPONINI, MYOGLOBIN in the last 168 hours.  Invalid input(s): CK ------------------------------------------------------------------------------------------------------------------    Component Value Date/Time   BNP 524.6 (H) 11/12/2024 0241    CBG: Recent Labs  Lab 11/14/24 2005 11/14/24 2118 11/15/24 0004 11/15/24 0344 11/15/24 0739  GLUCAP 127* 149* 150* 121* 186*    Recent Results (from the past 240 hours)  Resp panel by RT-PCR (RSV, Flu A&B, Covid) Anterior Nasal Swab     Status: None   Collection Time: 11/12/24  2:41 AM   Specimen: Anterior Nasal Swab  Result Value Ref Range Status   SARS Coronavirus 2 by RT PCR NEGATIVE NEGATIVE Final   Influenza A by  PCR NEGATIVE NEGATIVE Final   Influenza B by PCR NEGATIVE NEGATIVE Final    Comment: (NOTE) The Xpert Xpress SARS-CoV-2/FLU/RSV plus assay is intended as an aid in the diagnosis of influenza from Nasopharyngeal swab specimens and should not be used as a sole basis for treatment. Nasal washings and aspirates are unacceptable for Xpert Xpress SARS-CoV-2/FLU/RSV testing.  Fact Sheet for Patients: bloggercourse.com  Fact Sheet for Healthcare Providers: seriousbroker.it  This test is not yet approved or cleared by the [place]  FDA and has been authorized for detection and/or diagnosis of SARS-CoV-2 by FDA under an Emergency Use Authorization (EUA). This EUA will remain in effect (meaning this  test can be used) for the duration of the COVID-19 declaration under Section 564(b)(1) of the Act, 21 U.S.C. section 360bbb-3(b)(1), unless the authorization is terminated or revoked.     Resp Syncytial Virus by PCR NEGATIVE NEGATIVE Final    Comment: (NOTE) Fact Sheet for Patients: bloggercourse.com  Fact Sheet for Healthcare Providers: seriousbroker.it  This test is not yet approved or cleared by the [place]  FDA and has been authorized for detection and/or diagnosis of SARS-CoV-2 by FDA under an Emergency Use Authorization (EUA). This EUA will remain in effect (meaning this test can be used) for the duration of the COVID-19 declaration under Section 564(b)(1) of the Act, 21 U.S.C. section 360bbb-3(b)(1), unless the authorization is terminated or revoked.  Performed at [facility], [address] [place], [place] [address]   Blood culture (routine x 2)     Status: None (Preliminary result)   Collection Time: 11/12/24  3:35 AM   Specimen: BLOOD RIGHT HAND  Result Value Ref Range Status   Specimen Description BLOOD RIGHT HAND  Final   Special Requests   Final    BOTTLES DRAWN AEROBIC AND ANAEROBIC Blood Culture adequate volume   Culture   Final    NO GROWTH 3 DAYS Performed at [facility], [address], [place], [place] [address]    Report Status PENDING  Incomplete  Blood culture (routine x 2)     Status: None (Preliminary result)   Collection Time: 11/12/24  4:11 AM   Specimen: BLOOD RIGHT ARM  Result Value Ref Range Status   Specimen Description BLOOD RIGHT ARM  Final   Special Requests   Final    BOTTLES DRAWN AEROBIC AND ANAEROBIC Blood Culture adequate volume   Culture   Final    NO GROWTH 3 DAYS Performed at [facility], [address], [place], [place] [address]    Report Status PENDING  Incomplete  Body fluid culture w Gram Stain     Status: None (Preliminary result)   Collection  Time: 11/13/24 10:16 AM   Specimen: Lung, Left; Pleural Fluid  Result Value Ref Range Status   Specimen Description PLEURAL  Final   Special Requests LUNG,LEFT  Final   Gram Stain   Final    ABUNDANT WBC PRESENT, PREDOMINANTLY PMN ABUNDANT GRAM POSITIVE COCCI IN CHAINS    Culture   Final    CULTURE REINCUBATED FOR BETTER GROWTH Performed at [facility], [address] [place], [place] [address]    Report Status PENDING  Incomplete  Aerobic/Anaerobic Culture w Gram Stain (surgical/deep wound)     Status: None (Preliminary result)   Collection Time: 11/14/24 12:50 PM   Specimen: Path Tissue  Result Value Ref Range Status   Specimen Description TISSUE  Final   Special Requests PLEURAL LEFT  Final   Gram Stain   Final    ABUNDANT WBC PRESENT, PREDOMINANTLY PMN MODERATE GRAM POSITIVE COCCI IN CHAINS    Culture   Final    NO GROWTH < 24 HOURS Performed at [facility], [address], [place], [place] [address]    Report Status PENDING  Incomplete     Radiology Studies: CT GUIDED SOFT TISSUE FLUID DRAIN BY PERC CATH Result Date: 11/14/2024 INDICATION: Empyema. Planned placement of a left-sided pleural catheter for drainage and therapy. EXAM: CT-guided chest tube placement TECHNIQUE: Multidetector CT imaging of the chest was performed following the standard protocol without IV contrast. RADIATION DOSE REDUCTION: This exam was performed according to the departmental dose-optimization program which includes automated exposure control, adjustment of the mA and/or kV according to patient size and/or use of iterative reconstruction technique. MEDICATIONS: The patient is currently admitted to the hospital and receiving intravenous antibiotics. The antibiotics were administered within an appropriate time frame prior to the initiation of the procedure. ANESTHESIA/SEDATION: Moderate (conscious) sedation was employed during this procedure. A total of Versed  0.5 mg and Fentanyl  25 mcg  was administered intravenously by the radiology nurse. Total intra-service moderate Sedation Time: 11 minutes. The patient's level of consciousness and vital signs were monitored continuously by radiology nursing throughout the procedure under my direct supervision. COMPLICATIONS: None immediate. PROCEDURE: Informed written consent was obtained from the patient after a thorough discussion of the procedural risks, benefits and alternatives. All questions were addressed. Maximal Sterile Barrier Technique was utilized including caps, mask, sterile gowns, sterile gloves, sterile drape, hand hygiene and skin antiseptic. A timeout was performed prior to the initiation of the procedure. In a supine position radiopaque markers were placed on the patient's lower left lateral chest. Initial axial images of the chest were obtained. The patient's skin was then marked, prepped, and draped in usual sterile fashion. Local anesthesia was achieved 1% lidocaine . A small incision was made in the left axillary region. A 7 cm Yueh needle was then advanced with intermittent axial images to verify position and redirect the needle as necessary. When the needle was within the pleural cavity, the needle was removed leaving the Yueh catheter behind. Short Amplatz wire was then advanced through the sheath and coiled within the fluid collection. Access was dilated with a 12 French dilator. A 12 French pigtail catheter was then advanced over the guidewire and coiled in position within the fluid collection. The stiffener and wire were retrieved. The locking mechanism engaged. Retention suture was applied. The catheter was then connected to the atrium evacuation device. A small sample was obtained and sent to pathology for culture and sensitivity. IMPRESSION: Satisfactory placement of a 12 French pigtail catheter. Catheter connected to atrium to assist in evacuation of the material. Electronically Signed   By: Cordella Banner   On: 11/14/2024  12:56   Nilda Fendt, MD, PhD [facility]  Between 7 am - 7 pm I am available, please contact me via Amion (for emergencies) or Securechat (non urgent messages)  Between 7 pm - 7 am I am not available, please contact night coverage MD/APP via Amion

## 2024-11-15 NOTE — Progress Notes (Signed)
 NAME:  Jeffery [name], MRN:  [id number], DOB:  12-04-1941, LOS: 3 ADMISSION DATE:  11/12/2024, CONSULTATION DATE:  12/02 REFERRING MD:  Dr Trixie, CHIEF COMPLAINT:  shortness of breath    History of Present Illness:  Patient is a 83 yo M w/ pertinent PMH bronchiectasis, asthma, T2DM, type 2 2nd degree heart block s/p pacemaker placement, systolic chf, GERD presents to [facility] on 12/1 w/ pleural effusion.   Worsening dyspnea and cough over the past month. On 12/1 increased wob. EMS called found hypoxic and placed on supplemental o2 and given nebs. Taken to [facility] ED. On arrival sats 93% on 2 l/m . Noted to have rales on left side. CXR showing LUL and LLL infiltrate. CT chest showing diffuse patchy infiltrates LUL and LLL; large left loculated pleural effusion concerning for empyema or parapneumonic effusion. Afebrile and wbc 26. Cultures obtained and started on broad spectrum abx. Swallow eval performed showing severe aspiration. Abx changed to unasyn for possible aspiration pna. On 12/2 IR performed thora with only 150 green tinged purulent fluids. Pleural LDH >2,500. TNC count 1,970,000. PCCM consulted.    Pertinent  Medical History   Past Medical History:  Diagnosis Date   Arthritis    thumbs (07/25/2018)   Asthma    CHF (congestive heart failure) (HCC)    Chronic bronchitis (HCC)    Environmental allergies    GERD (gastroesophageal reflux disease)    Headache    seasonal; w/environmental allergies (07/25/2018)   History of blood transfusion    when I had laminectomy (07/25/2018)   HTN (hypertension)    Hyperlipidemia    LBBB (left bundle branch block) 1999   Metatarsal bone fracture 03/07/2018   Myocardial infarction [facility])    was told I've had an old MI; probably in the 1990s (07/25/2018)   Pneumonia    as a child, age 57; viral pneumonia 3 times in the last 10 years (07/25/2018)   Presence of permanent cardiac pacemaker 07/25/2018   Seasonal allergies    Sleep apnea     wife says I do (07/25/2018)   Type II diabetes mellitus (HCC)      Significant Hospital Events: Including procedures, antibiotic start and stop dates in addition to other pertinent events   12/2- left thoracentesis 12/02- left sided chest tube placement   Interim History / Subjective:   Thick cloudy output 550 in 24 hrs 200 in last 12 hrs  Review of system positive for left chest discomfort   Objective    Blood pressure 99/64, pulse 61, temperature 97.7 F (36.5 C), temperature source Oral, resp. rate 17, height 5' 9 (1.753 m), weight 66.5 kg, SpO2 98%.        Intake/Output Summary (Last 24 hours) at 11/15/2024 1439 Last data filed at 11/15/2024 1049 Gross per 24 hour  Intake 984.65 ml  Output 550 ml  Net 434.65 ml   Filed Weights   11/13/24 0500 11/14/24 0402 11/15/24 0434  Weight: 65.6 kg 65.1 kg 66.5 kg    Examination: Examination: Awake, alert Decreased BS on left No edema    Assessment and Plan   Left sided PNA Left sided empyema strep intermedius Possible aspiration Hx of bronchiectasis and asthma -Pleural LDH >2,500. TNC count 1,970,000, purulent, GPC in chains in stain   Plan: -  continue unasyn and doxycycline  -  strep intermedius in culture, sensitivity pending -  started tpa/dornase day 1. Unclamp in an hour -  please place chest tube to -20 suction; monitor outpt -  flush with 10 cc every 6 hrs -  pulm toilet -  cont home maintenance inhaler/nebs - aspiration precautions. Advised pt to follow speech therapy recommendations    Discussed plan with the patient and family   Labs   CBC: Recent Labs  Lab 11/12/24 0241 11/12/24 0246 11/13/24 0221 11/14/24 0235 11/15/24 0237  WBC 26.1*  --  23.3* 16.3* 12.9*  NEUTROABS 24.3*  --   --   --   --   HGB 12.0* 13.6 10.3* 9.6* 10.4*  HCT 38.9* 40.0 32.2* 29.7* 33.3*  MCV 81.7  --  79.1* 78.2* 80.4  PLT 628*  --  445* 463* 518*    Basic Metabolic Panel: Recent Labs  Lab 11/12/24 0241  11/12/24 0246 11/13/24 0221 11/14/24 0235 11/15/24 0237  NA 127* 127* 132* 132* 136  K 5.4* 5.1 4.8 3.8 4.1  CL 91*  --  94* 96* 100  CO2 19*  --  28 28 28   GLUCOSE 155*  --  182* 138* 129*  BUN 25*  --  26* 28* 26*  CREATININE 1.07  --  1.16 0.93 0.93  CALCIUM  8.7*  --  8.5* 8.4* 8.3*  MG  --   --  1.9  --  2.0  PHOS  --   --   --   --  2.8   GFR: Estimated Creatinine Clearance: 58.6 mL/min (by C-G formula based on SCr of 0.93 mg/dL). Recent Labs  Lab 11/12/24 0241 11/12/24 0402 11/12/24 0718 11/13/24 0221 11/14/24 0235 11/15/24 0237  WBC 26.1*  --   --  23.3* 16.3* 12.9*  LATICACIDVEN  --  3.1* 2.1*  --   --   --     Liver Function Tests: Recent Labs  Lab 11/13/24 0221 11/15/24 0237  AST 39 66*  ALT 48* 57*  ALKPHOS 111 103  BILITOT 0.5 0.5  PROT 5.9* 5.3*  ALBUMIN 1.7* 1.7*   No results for input(s): LIPASE, AMYLASE in the last 168 hours. No results for input(s): AMMONIA in the last 168 hours.  ABG    Component Value Date/Time   HCO3 23.4 11/12/2024 0246   TCO2 24 11/12/2024 0246   O2SAT 56 11/12/2024 0246     Coagulation Profile: Recent Labs  Lab 11/14/24 0939  INR 1.2    Cardiac Enzymes: No results for input(s): CKTOTAL, CKMB, CKMBINDEX, TROPONINI in the last 168 hours.  HbA1C: Hemoglobin A1C  Date/Time Value Ref Range Status  08/07/2024 09:14 AM 5.9 (A) 4.0 - 5.6 % Final   HbA1c, POC (prediabetic range)  Date/Time Value Ref Range Status  08/07/2024 09:14 AM 5.9 (A) 5.7 - 6.4 % Final   HbA1c, POC (controlled diabetic range)  Date/Time Value Ref Range Status  08/07/2024 09:14 AM 5.9 0.0 - 7.0 % Final   HbA1c POC (<> result, manual entry)  Date/Time Value Ref Range Status  08/07/2024 09:14 AM 5.9 4.0 - 5.6 % Final   Hgb A1c MFr Bld  Date/Time Value Ref Range Status  02/20/2024 10:37 AM 6.7 (H) 4.6 - 6.5 % Final    Comment:    Glycemic Control Guidelines for People with Diabetes:Non Diabetic:  <6%Goal of Therapy:  <7%Additional Action Suggested:  >8%   08/23/2023 10:10 AM 6.6 (H) 4.6 - 6.5 % Final    Comment:    Glycemic Control Guidelines for People with Diabetes:Non Diabetic:  <6%Goal of Therapy: <7%Additional Action Suggested:  >8%     CBG: Recent Labs  Lab 11/14/24 2118 11/15/24 0004 11/15/24  9655 11/15/24 0739 11/15/24 1135  GLUCAP 149* 150* 121* 186* 182*     Past Medical History:  He,  has a past medical history of Arthritis, Asthma, CHF (congestive heart failure) (HCC), Chronic bronchitis (HCC), Environmental allergies, GERD (gastroesophageal reflux disease), Headache, History of blood transfusion, HTN (hypertension), Hyperlipidemia, LBBB (left bundle branch block) (1999), Metatarsal bone fracture (03/07/2018), Myocardial infarction (HCC), Pneumonia, Presence of permanent cardiac pacemaker (07/25/2018), Seasonal allergies, Sleep apnea, and Type II diabetes mellitus (HCC).   Surgical History:   Past Surgical History:  Procedure Laterality Date   BACK SURGERY     BIV PACEMAKER INSERTION CRT-P  07/25/2018   BIV PACEMAKER INSERTION CRT-P N/A 07/25/2018   Procedure: BIV PACEMAKER INSERTION CRT-P;  Surgeon: Waddell Danelle ORN, MD;  Location: [facility] INVASIVE CV LAB;  Service: Cardiovascular;  Laterality: N/A;   CARDIAC CATHETERIZATION  2003   Dr Esmeralda Sharps; 85 % R circumflex obstruction   CARPAL TUNNEL RELEASE Left 10/11/2014   Procedure: LEFT CARPAL TUNNEL RELEASE;  Surgeon: Elsie Mussel, MD;  Location: [facility];  Service: Orthopedics;  Laterality: Left;   COLONOSCOPY W/ BIOPSIES AND POLYPECTOMY  2018   INGUINAL HERNIA REPAIR Right 1948   INGUINAL HERNIA REPAIR Left 1988   IR THORACENTESIS ASP PLEURAL SPACE W/IMG GUIDE  11/13/2024   LUMBAR LAMINECTOMY  1984   MINOR CARPAL TUNNEL Right 11/22/2014   Procedure: RIGHT LIMITED OPEN CARPAL TUNNEL RELEASE;  Surgeon: Elsie Mussel, MD;  Location: [facility];  Service: Orthopedics;  Laterality: Right;   RIGHT/LEFT  HEART CATH AND CORONARY ANGIOGRAPHY N/A 10/08/2024   Procedure: RIGHT/LEFT HEART CATH AND CORONARY ANGIOGRAPHY;  Surgeon: Rolan Ezra RAMAN, MD;  Location: [facility] INVASIVE CV LAB;  Service: Cardiovascular;  Laterality: N/A;   TONSILLECTOMY  1958     Social History:   reports that he quit smoking about 60 years ago. His smoking use included cigarettes. He started smoking about 62 years ago. He has a 4 pack-year smoking history. He has never used smokeless tobacco. He reports current alcohol use of about 2.0 standard drinks of alcohol per week. He reports that he does not use drugs.   Family History:  His family history includes Colon polyps in his father; Diabetes in his maternal aunt; Heart attack in his father; Heart failure in his mother; Hypertension in his mother; Subarachnoid hemorrhage (age of onset: 35) in his mother; Subarachnoid hemorrhage (age of onset: 46) in his paternal grandfather. There is no history of Colon cancer.   Allergies Allergies  Allergen Reactions   Contrast Media [Iodinated Contrast Media] Other (See Comments)    Redness and warm sensation, this reaction was noted on 02/27/13 during a Cardiac MRI per pt.  Pt sts he had erythema on his head, chest, and shoulders.  Pt had a pacemaker placed August 2019 and was pre-medicated for the dye and had no allergic reaction at that time. -Carissa Mozingo B.S. RT(R)(CT)     Home Medications  Prior to Admission medications   Medication Sig Start Date End Date Taking? Authorizing Provider  albuterol  (VENTOLIN  HFA) 108 (90 Base) MCG/ACT inhaler TAKE 2 PUFFS BY MOUTH EVERY 6 HOURS AS NEEDED FOR WHEEZE OR SHORTNESS OF BREATH 06/21/22  Yes Burns, Glade PARAS, MD  bisoprolol  (ZEBETA ) 5 MG tablet Take 0.5 tablets (2.5 mg total) by mouth daily. 09/12/24  Yes Rolan Ezra RAMAN, MD  dapagliflozin  propanediol (FARXIGA ) 10 MG TABS tablet Take 1 tablet (10 mg total) by mouth daily before breakfast. 12/20/23  Yes  Court Dorn PARAS, MD  fluticasone -salmeterol  (ADVAIR HFA) 230-21 MCG/ACT inhaler Inhale 2 puffs into the lungs 2 (two) times daily. 08/07/24  Yes Burns, Glade PARAS, MD  furosemide  (LASIX ) 40 MG tablet Take 0.5 tablets (20 mg total) by mouth daily. 11/07/24  Yes Rolan Ezra RAMAN, MD  glipiZIDE  (GLUCOTROL ) 5 MG tablet TAKE 1-2 TABLETS (5-10 MG TOTAL) BY MOUTH 2 (TWO) TIMES DAILY BEFORE A MEAL. 05/11/24  Yes Burns, Glade PARAS, MD  HYDROcodone -acetaminophen  (NORCO) 7.5-325 MG tablet Take 1 tablet by mouth every 6 (six) hours as needed for severe pain (pain score 7-10) (for chronic joint pain, neck pain). 11/07/24  Yes Burns, Glade PARAS, MD  ipratropium (ATROVENT ) 0.02 % nebulizer solution TAKE 2.5 ML (0.5 MG TOTAL) BY NEBULIZATION 4 TIMES A DAY Patient taking differently: Take 0.5 mg by nebulization 2 (two) times daily as needed for shortness of breath or wheezing. 08/31/24  Yes Burns, Glade PARAS, MD  metFORMIN  (GLUCOPHAGE ) 500 MG tablet TAKE 1 TABLET BY MOUTH TWICE A DAY WITH FOOD 11/12/24  Yes Burns, Glade PARAS, MD  mirtazapine  (REMERON ) 7.5 MG tablet Take 1-2 tablets (7.5-15 mg total) by mouth at bedtime. Patient taking differently: Take 15 mg by mouth at bedtime. 10/30/24  Yes Burns, Glade PARAS, MD  montelukast  (SINGULAIR ) 10 MG tablet Take 1 tablet (10 mg total) by mouth at bedtime. 02/29/24  Yes Burns, Glade PARAS, MD  nitroGLYCERIN  (NITROSTAT ) 0.4 MG SL tablet Place 1 tablet (0.4 mg total) under the tongue every 5 (five) minutes x 3 doses as needed for chest pain. 03/22/23  Yes Hobart Powell BRAVO, MD  potassium chloride  20 MEQ TBCR Take 0.5 tablets (10 mEq total) by mouth daily. 07/19/24  Yes Court Dorn PARAS, MD  spironolactone  (ALDACTONE ) 25 MG tablet Take 1 tablet (25 mg total) by mouth daily. 10/18/24  Yes Rolan Ezra RAMAN, MD  traMADol  (ULTRAM ) 50 MG tablet Take 50-100 mg by mouth every 8 (eight) hours as needed for moderate pain (pain score 4-6) or severe pain (pain score 7-10).   Yes [provider]  valsartan  (DIOVAN ) 40 MG tablet Take 0.5 tablets (20 mg  total) by mouth daily. 10/18/24  Yes Rolan Ezra RAMAN, MD  vitamin E 180 MG (400 UNITS) capsule Take 400 Units by mouth daily.   Yes [provider]  albuterol  (PROVENTIL ) (2.5 MG/3ML) 0.083% nebulizer solution Take 3 mLs (2.5 mg total) by nebulization every 6 (six) hours as needed for wheezing or shortness of breath. 04/24/24   Geofm Glade PARAS, MD  aspirin  EC 81 MG tablet Take 1 tablet (81 mg total) by mouth at bedtime. Swallow whole. 12/05/23   Danford, Lonni SQUIBB, MD  LORazepam  (ATIVAN ) 1 MG tablet TAKE 1/2 TO 1 TABLET BY MOUTH NIGHTLY AS NEEDED. 05/18/24   Burns, Glade PARAS, MD  rosuvastatin  (CRESTOR ) 10 MG tablet TAKE 1 TABLET ON MONDAY, WEDNESDAY & FRIDAY. 12/20/23   Court Dorn PARAS, MD     Critical care time: n/a

## 2024-11-15 NOTE — Progress Notes (Signed)
 Mobility Specialist Progress Note:    11/15/24 1450  Mobility  Activity Turned to back - supine (Ankle Pumps, Leg Ext, Leg Lifts)  Level of Assistance Standby assist, set-up cues, supervision of patient - no hands on  Assistive Device None  Range of Motion/Exercises Active  Activity Response Tolerated well  Mobility Referral Yes  Mobility visit 1 Mobility  Mobility Specialist Start Time (ACUTE ONLY) 1450  Mobility Specialist Stop Time (ACUTE ONLY) 1457  Mobility Specialist Time Calculation (min) (ACUTE ONLY) 7 min   Received pt laying in bed agreeable to session. Pt no c/o any symptoms. Pt moving and ambulating well. Left pt in bed w/ all needs met.   Venetia Keel Mobility Specialist Please Neurosurgeon or Rehab Office at [phone]

## 2024-11-15 NOTE — Care Management Important Message (Signed)
 Important Message  Patient Details  Name: Jeffery [name] MRN: [id number] Date of Birth: [birth year]   Important Message Given:  Yes - Medicare IM     [name] 11/15/2024, 11:29 AM

## 2024-11-16 ENCOUNTER — Inpatient Hospital Stay (HOSPITAL_COMMUNITY)

## 2024-11-16 ENCOUNTER — Other Ambulatory Visit: Payer: Self-pay

## 2024-11-16 DIAGNOSIS — J869 Pyothorax without fistula: Secondary | ICD-10-CM

## 2024-11-16 DIAGNOSIS — J189 Pneumonia, unspecified organism: Secondary | ICD-10-CM | POA: Diagnosis not present

## 2024-11-16 DIAGNOSIS — J90 Pleural effusion, not elsewhere classified: Secondary | ICD-10-CM

## 2024-11-16 LAB — GLUCOSE, CAPILLARY
Glucose-Capillary: 111 mg/dL — ABNORMAL HIGH (ref 70–99)
Glucose-Capillary: 114 mg/dL — ABNORMAL HIGH (ref 70–99)
Glucose-Capillary: 115 mg/dL — ABNORMAL HIGH (ref 70–99)
Glucose-Capillary: 116 mg/dL — ABNORMAL HIGH (ref 70–99)
Glucose-Capillary: 121 mg/dL — ABNORMAL HIGH (ref 70–99)
Glucose-Capillary: 141 mg/dL — ABNORMAL HIGH (ref 70–99)
Glucose-Capillary: 145 mg/dL — ABNORMAL HIGH (ref 70–99)
Glucose-Capillary: 156 mg/dL — ABNORMAL HIGH (ref 70–99)

## 2024-11-16 LAB — COMPREHENSIVE METABOLIC PANEL WITH GFR
ALT: 49 U/L — ABNORMAL HIGH (ref 0–44)
AST: 39 U/L (ref 15–41)
Albumin: 1.6 g/dL — ABNORMAL LOW (ref 3.5–5.0)
Alkaline Phosphatase: 91 U/L (ref 38–126)
Anion gap: 11 (ref 5–15)
BUN: 19 mg/dL (ref 8–23)
CO2: 25 mmol/L (ref 22–32)
Calcium: 8.2 mg/dL — ABNORMAL LOW (ref 8.9–10.3)
Chloride: 100 mmol/L (ref 98–111)
Creatinine, Ser: 0.83 mg/dL (ref 0.61–1.24)
GFR, Estimated: >60 mL/min (ref >60)
Glucose, Bld: 112 mg/dL — ABNORMAL HIGH (ref 70–99)
Potassium: 3.7 mmol/L (ref 3.5–5.1)
Sodium: 136 mmol/L (ref 135–145)
Total Bilirubin: 0.4 mg/dL (ref 0.0–1.2)
Total Protein: 5.4 g/dL — ABNORMAL LOW (ref 6.5–8.1)

## 2024-11-16 LAB — CBC
HCT: 30.7 % — ABNORMAL LOW (ref 39.0–52.0)
Hemoglobin: 9.6 g/dL — ABNORMAL LOW (ref 13.0–17.0)
MCH: 25 pg — ABNORMAL LOW (ref 26.0–34.0)
MCHC: 31.3 g/dL (ref 30.0–36.0)
MCV: 79.9 fL — ABNORMAL LOW (ref 80.0–100.0)
Platelets: 456 K/uL — ABNORMAL HIGH (ref 150–400)
RBC: 3.84 MIL/uL — ABNORMAL LOW (ref 4.22–5.81)
RDW: 17.2 % — ABNORMAL HIGH (ref 11.5–15.5)
WBC: 8.9 K/uL (ref 4.0–10.5)
nRBC: 0 % (ref 0.0–0.2)

## 2024-11-16 LAB — BODY FLUID CULTURE W GRAM STAIN

## 2024-11-16 LAB — MAGNESIUM: Magnesium: 1.9 mg/dL (ref 1.7–2.4)

## 2024-11-16 MED ORDER — SODIUM CHLORIDE (PF) 0.9 % IJ SOLN
10.0000 mg | Freq: Once | INTRAMUSCULAR | Status: AC
  Administered 2024-11-16: 10 mg via INTRAPLEURAL
  Filled 2024-11-16 (×2): qty 10

## 2024-11-16 MED ORDER — STERILE WATER FOR INJECTION IJ SOLN
5.0000 mg | Freq: Once | RESPIRATORY_TRACT | Status: AC
  Administered 2024-11-16: 5 mg via INTRAPLEURAL
  Filled 2024-11-16: qty 5

## 2024-11-16 NOTE — Plan of Care (Signed)

## 2024-11-16 NOTE — Progress Notes (Signed)
 Physical Therapy Treatment Patient Details Name: Jeffery [name] MRN: [id number] DOB: [birth year] Today's Date: 11/16/2024   History of Present Illness 83 yo M adm 11/12/24 with cough, dyspnea, malaise. LLL PNA with purulent pleural effusion. Thoracentesis 12/1.  12/3 Lt pleural pigtail. PMH: asthma, bronchiectasis, DM2, heart block s/p PPM, CHF    PT Comments  Pt very pleasant stating in all reality he has probably been declining in function for 8 months with decreased activity tolerance and limited ability to perform yard work and pool maintenance. Pt with cool digits making pulse ox probe reading challenging but tolerated gait and stairs on RA this date. Pt fatigued end of session but with noted glute weakness and educated with demonstration for standing hip abduct and ext. Will continue to follow and encouraged in room mobility for toileting and walking.  HHPT appropriate.   If plan is discharge home, recommend the following: A little help with walking and/or transfers;A little help with bathing/dressing/bathroom;Assistance with cooking/housework;Assist for transportation;Help with stairs or ramp for entrance   Can travel by private vehicle        Equipment Recommendations  Rollator (4 wheels)    Recommendations for Other Services       Precautions / Restrictions Precautions Precautions: Fall;Other (comment) Recall of Precautions/Restrictions: Intact Precaution/Restrictions Comments: chest tube     Mobility  Bed Mobility               General bed mobility comments: in chair on arrival and end of session    Transfers Overall transfer level: Needs assistance   Transfers: Sit to/from Stand Sit to Stand: Contact guard assist           General transfer comment: cues for hand placement    Ambulation/Gait Ambulation/Gait assistance: Contact guard assist Gait Distance (Feet): 200 Feet Assistive device: Rolling walker (2 wheels) Gait Pattern/deviations:  Step-through pattern, Decreased stride length   Gait velocity interpretation: 1.31 - 2.62 ft/sec, indicative of limited community ambulator   General Gait Details: slow steady gait with 3 standing rests with SPO2 >90% on RA when able to get pleth. Pt with cold fingers and difficulty getting reading with no SOB with gait. Cues for RW use and direction.limited by fatigue   Stairs Stairs: Yes Stairs assistance: Contact guard assist Stair Management: Alternating pattern, Forwards Number of Stairs: 11 General stair comments: slow ascent with alternating pattern, use of bil rails and seated rest between ascent and descent, CGA for safety and CT   Wheelchair Mobility     Tilt Bed    Modified Rankin (Stroke Patients Only)       Balance Overall balance assessment: Needs assistance Sitting-balance support: No upper extremity supported, Feet supported Sitting balance-Leahy Scale: Good     Standing balance support: Bilateral upper extremity supported, During functional activity, Reliant on assistive device for balance Standing balance-Leahy Scale: Poor                              Communication Communication Communication: No apparent difficulties Factors Affecting Communication: Hearing impaired (pt reports Rt ear HOH, appropriate during session)  Cognition Arousal: Alert Behavior During Therapy: WFL for tasks assessed/performed   PT - Cognitive impairments: No apparent impairments                         Following commands: Intact      Cueing Cueing Techniques: Verbal cues  Exercises  General Comments        Pertinent Vitals/Pain Pain Assessment Pain Assessment: 0-10 Pain Score: 2  Pain Descriptors / Indicators: Sore Pain Intervention(s): Limited activity within patient's tolerance, Repositioned    Home Living                          Prior Function            PT Goals (current goals can now be found in the care plan  section) Progress towards PT goals: Progressing toward goals    Frequency    Min 2X/week      PT Plan      Co-evaluation              AM-PAC PT 6 Clicks Mobility   Outcome Measure  Help needed turning from your back to your side while in a flat bed without using bedrails?: A Little Help needed moving from lying on your back to sitting on the side of a flat bed without using bedrails?: A Little Help needed moving to and from a bed to a chair (including a wheelchair)?: A Little Help needed standing up from a chair using your arms (e.g., wheelchair or bedside chair)?: A Little Help needed to walk in hospital room?: A Little Help needed climbing 3-5 steps with a railing? : A Little 6 Click Score: 18    End of Session   Activity Tolerance: Patient limited by fatigue Patient left: in chair;with call bell/phone within reach;with nursing/sitter in room Nurse Communication: Mobility status PT Visit Diagnosis: Muscle weakness (generalized) (M62.81);Other abnormalities of gait and mobility (R26.89)     Time: 9076-9046 PT Time Calculation (min) (ACUTE ONLY): 30 min  Charges:    $Gait Training: 8-22 mins $Therapeutic Activity: 8-22 mins PT General Charges $$ ACUTE PT VISIT: 1 Visit                     Lenoard SQUIBB, PT Acute Rehabilitation Services Office: [phone]    Espiridion Supinski B Timouthy Gilardi 11/16/2024, 10:20 AM

## 2024-11-16 NOTE — Progress Notes (Signed)
 PROGRESS NOTE  Jeffery [name] FMW:[id number] DOB: 11-30-1941 DOA: 11/12/2024 PCP: Geofm Glade PARAS, MD   LOS: 4 days   Brief Narrative / Interim history: 83 year old male with history of asthma, bronchiectasis, DM2, heart block status post pacemaker, chronic systolic CHF who comes into the hospital with complaints of cough, dyspnea, malaise ongoing for the last several weeks if not months.  He has been having poor p.o. intake and has lost a lot of weight during this period of time.  He was found to have pneumonia with a pleural effusion, and upon sampling it turned to be purulent and pulmonary was consulted.  Subjective / 24h Interval events: He is doing well this morning, continues to have 3 - 4 loose bowel movements per day, has some discomfort at the chest tube insertion site  Assesement and Plan: Principal problem Left upper lobe pneumonia, empyema -underwent thoracentesis 12/2, found to have purulent fluid.  He is now status post IR guided chest tube placement.  Pulmonary following, appreciate input - Cultures with GPC's in chains, preliminary cultures show Streptococcus, awaiting sensitivities - Chest tube management per pulmonary, received lytics yesterday  Active problems Chronic systolic CHF -2D echo with LVEF 25-30%, global hypokinesis, mild LVH, RV systolic function with moderate reduction and enlargement.  Appears fairly euvolemic - Continue dapagliflozin , bisoprolol , statin.  No chest pain  Heart block-status post pacemaker  Oral thrush-continue nystatin   Diarrhea-possibly side effect from antibiotics.  No abdominal pain, no nausea or vomiting.  Placed on Florastor  Hyperlipidemia-continue statin  History of a flutter - not on anticoagulation, telemetry showed paced rhythm.  Continue bisoprolol  and aspirin   History of bronchiectasis/asthma -no wheezing, felt to have exacerbation on admission and placed on prednisone , continue along with  bronchodilators.  Hyponatremia-mild, monitor, no large shifts noted, now normalized  DM2 -continue sliding scale  CBG (last 3)  Recent Labs    11/16/24 0411 11/16/24 0433 11/16/24 0912  GLUCAP 116* 111* 141*   Scheduled Meds:  aspirin  EC  81 mg Oral QHS   bisoprolol   2.5 mg Oral Daily   dapagliflozin  propanediol  10 mg Oral QAC breakfast   feeding supplement  237 mL Oral BID BM   fluticasone  furoate-vilanterol  1 puff Inhalation Daily   guaiFENesin   10 mL Oral TID   insulin  aspart  0-6 Units Subcutaneous Q4H   ipratropium  0.5 mg Nebulization Q6H   magic mouthwash w/lidocaine   10 mL Oral QID   montelukast   10 mg Oral QHS   nystatin   5 mL Mouth/Throat QID   polyethylene glycol  17 g Oral Daily   rosuvastatin   10 mg Oral Q M,W,F   saccharomyces boulardii  250 mg Oral BID   sodium chloride  flush  10 mL Intrapleural Q6H   sodium chloride  flush  3 mL Intravenous Q12H   Continuous Infusions:  ampicillin -sulbactam (UNASYN ) IV 3 g (11/16/24 0512)   doxycycline  (VIBRAMYCIN ) IV 100 mg (11/16/24 0640)   PRN Meds:.acetaminophen  **OR** acetaminophen , albuterol , LORazepam , morphine  injection, oxyCODONE , prochlorperazine   Current Outpatient Medications  Medication Instructions   albuterol  (PROVENTIL ) 2.5 mg, Nebulization, Every 6 hours PRN   albuterol  (VENTOLIN  HFA) 108 (90 Base) MCG/ACT inhaler TAKE 2 PUFFS BY MOUTH EVERY 6 HOURS AS NEEDED FOR WHEEZE OR SHORTNESS OF BREATH   aspirin  EC 81 mg, Oral, Daily at bedtime, Swallow whole.   bisoprolol  (ZEBETA ) 2.5 mg, Oral, Daily   dapagliflozin  propanediol (FARXIGA ) 10 mg, Oral, Daily before breakfast   fluticasone -salmeterol (ADVAIR HFA) 230-21 MCG/ACT inhaler 2 puffs,  Inhalation, 2 times daily   furosemide  (LASIX ) 20 mg, Oral, Daily   glipiZIDE  (GLUCOTROL ) 5-10 mg, Oral, 2 times daily before meals   HYDROcodone -acetaminophen  (NORCO) 7.5-325 MG tablet 1 tablet, Oral, Every 6 hours PRN   ipratropium (ATROVENT ) 0.02 % nebulizer solution  TAKE 2.5 ML (0.5 MG TOTAL) BY NEBULIZATION 4 TIMES A DAY   LORazepam  (ATIVAN ) 1 MG tablet TAKE 1/2 TO 1 TABLET BY MOUTH NIGHTLY AS NEEDED.   metFORMIN  (GLUCOPHAGE ) 500 mg, Oral, 2 times daily with meals   mirtazapine  (REMERON ) 7.5-15 mg, Oral, Daily at bedtime   montelukast  (SINGULAIR ) 10 mg, Oral, Daily at bedtime   nitroGLYCERIN  (NITROSTAT ) 0.4 mg, Sublingual, Every 5 min x3 PRN   potassium chloride  20 MEQ TBCR 10 mEq, Oral, Daily   rosuvastatin  (CRESTOR ) 10 MG tablet TAKE 1 TABLET ON MONDAY, WEDNESDAY & FRIDAY.   spironolactone  (ALDACTONE ) 25 mg, Oral, Daily   traMADol  (ULTRAM ) 50-100 mg, Oral, Every 8 hours PRN   valsartan  (DIOVAN ) 20 mg, Oral, Daily   vitamin E (VITAMIN E) 400 Units, Oral, Daily    Diet Orders (From admission, onward)     Start     Ordered   11/14/24 1305  Diet Heart Room service appropriate? Yes; Fluid consistency: Thin  Diet effective now       Question Answer Comment  Room service appropriate? Yes   Fluid consistency: Thin      11/14/24 1304            DVT prophylaxis:    Lab Results  Component Value Date   PLT 456 (H) 11/16/2024      Code Status: Full Code  Family Communication: No family at bedside  Status is: Inpatient Remains inpatient appropriate because: Severity of illness  Level of care: Telemetry  Consultants:  PCCM IR  Objective: Vitals:   11/16/24 0041 11/16/24 0337 11/16/24 0915 11/16/24 1016  BP: 110/69 116/74 98/69   Pulse: 64 65 69 72  Resp: 18 16 17    Temp: 97.9 F (36.6 C) 97.7 F (36.5 C) 97.7 F (36.5 C)   TempSrc: Oral Oral Oral   SpO2: 98% 99% 95% 95%  Weight:  67.6 kg    Height:        Intake/Output Summary (Last 24 hours) at 11/16/2024 1030 Last data filed at 11/16/2024 0900 Gross per 24 hour  Intake 1728.65 ml  Output 1280 ml  Net 448.65 ml   Wt Readings from Last 3 Encounters:  11/16/24 67.6 kg  10/30/24 66.2 kg  10/18/24 68.9 kg    Examination:  Constitutional: NAD Eyes: lids and  conjunctivae normal, no scleral icterus ENMT: mmm Neck: normal, supple Respiratory: clear to auscultation bilaterally, no wheezing, no crackles. Normal respiratory effort.  Cardiovascular: Regular rate and rhythm, no murmurs / rubs / gallops. No LE edema. Abdomen: soft, no distention, no tenderness. Bowel sounds positive.   Data Reviewed: I have independently reviewed following labs and imaging studies   CBC Recent Labs  Lab 11/12/24 0241 11/12/24 0246 11/13/24 0221 11/14/24 0235 11/15/24 0237 11/15/24 1758 11/16/24 0229  WBC 26.1*  --  23.3* 16.3* 12.9* 9.2 8.9  HGB 12.0*   < > 10.3* 9.6* 10.4* 10.5* 9.6*  HCT 38.9*   < > 32.2* 29.7* 33.3* 33.8* 30.7*  PLT 628*  --  445* 463* 518* 526* 456*  MCV 81.7  --  79.1* 78.2* 80.4 81.3 79.9*  MCH 25.2*  --  25.3* 25.3* 25.1* 25.2* 25.0*  MCHC 30.8  --  32.0  32.3 31.2 31.1 31.3  RDW 17.4*  --  17.0* 17.1* 17.3* 17.2* 17.2*  LYMPHSABS 1.0  --   --   --   --  0.5*  --   MONOABS 0.8  --   --   --   --  0.9  --   EOSABS 0.0  --   --   --   --  0.0  --   BASOSABS 0.0  --   --   --   --  0.0  --    < > = values in this interval not displayed.    Recent Labs  Lab 11/12/24 0241 11/12/24 0246 11/12/24 0402 11/12/24 0718 11/13/24 0221 11/14/24 0235 11/14/24 0939 11/15/24 0237 11/16/24 0229  NA 127* 127*  --   --  132* 132*  --  136 136  K 5.4* 5.1  --   --  4.8 3.8  --  4.1 3.7  CL 91*  --   --   --  94* 96*  --  100 100  CO2 19*  --   --   --  28 28  --  28 25  GLUCOSE 155*  --   --   --  182* 138*  --  129* 112*  BUN 25*  --   --   --  26* 28*  --  26* 19  CREATININE 1.07  --   --   --  1.16 0.93  --  0.93 0.83  CALCIUM  8.7*  --   --   --  8.5* 8.4*  --  8.3* 8.2*  AST  --   --   --   --  39  --   --  66* 39  ALT  --   --   --   --  48*  --   --  57* 49*  ALKPHOS  --   --   --   --  111  --   --  103 91  BILITOT  --   --   --   --  0.5  --   --  0.5 0.4  ALBUMIN  --   --   --   --  1.7*  --   --  1.7* 1.6*  MG  --   --   --    --  1.9  --   --  2.0 1.9  LATICACIDVEN  --   --  3.1* 2.1*  --   --   --   --   --   INR  --   --   --   --   --   --  1.2  --   --   BNP 524.6*  --   --   --   --   --   --   --   --     ------------------------------------------------------------------------------------------------------------------ No results for input(s): CHOL, HDL, LDLCALC, TRIG, CHOLHDL, LDLDIRECT in the last 72 hours.  Lab Results  Component Value Date   HGBA1C 5.9 (A) 08/07/2024   HGBA1C 5.9 08/07/2024   HGBA1C 5.9 (A) 08/07/2024   HGBA1C 5.9 08/07/2024   ------------------------------------------------------------------------------------------------------------------ No results for input(s): TSH, T4TOTAL, T3FREE, THYROIDAB in the last 72 hours.  Invalid input(s): FREET3  Cardiac Enzymes No results for input(s): CKMB, TROPONINI, MYOGLOBIN in the last 168 hours.  Invalid input(s): CK ------------------------------------------------------------------------------------------------------------------    Component Value Date/Time   BNP 524.6 (H) 11/12/2024 0241    CBG: Recent Labs  Lab 11/16/24 0004 11/16/24 0036 11/16/24 0411 11/16/24 0433 11/16/24 0912  GLUCAP 115* 121* 116* 111* 141*    Recent Results (from the past 240 hours)  Resp panel by RT-PCR (RSV, Flu A&B, Covid) Anterior Nasal Swab     Status: None   Collection Time: 11/12/24  2:41 AM   Specimen: Anterior Nasal Swab  Result Value Ref Range Status   SARS Coronavirus 2 by RT PCR NEGATIVE NEGATIVE Final   Influenza A by PCR NEGATIVE NEGATIVE Final   Influenza B by PCR NEGATIVE NEGATIVE Final    Comment: (NOTE) The Xpert Xpress SARS-CoV-2/FLU/RSV plus assay is intended as an aid in the diagnosis of influenza from Nasopharyngeal swab specimens and should not be used as a sole basis for treatment. Nasal washings and aspirates are unacceptable for Xpert Xpress SARS-CoV-2/FLU/RSV testing.  Fact Sheet for  Patients: bloggercourse.com  Fact Sheet for Healthcare Providers: seriousbroker.it  This test is not yet approved or cleared by the [place]  FDA and has been authorized for detection and/or diagnosis of SARS-CoV-2 by FDA under an Emergency Use Authorization (EUA). This EUA will remain in effect (meaning this test can be used) for the duration of the COVID-19 declaration under Section 564(b)(1) of the Act, 21 U.S.C. section 360bbb-3(b)(1), unless the authorization is terminated or revoked.     Resp Syncytial Virus by PCR NEGATIVE NEGATIVE Final    Comment: (NOTE) Fact Sheet for Patients: bloggercourse.com  Fact Sheet for Healthcare Providers: seriousbroker.it  This test is not yet approved or cleared by the [place]  FDA and has been authorized for detection and/or diagnosis of SARS-CoV-2 by FDA under an Emergency Use Authorization (EUA). This EUA will remain in effect (meaning this test can be used) for the duration of the COVID-19 declaration under Section 564(b)(1) of the Act, 21 U.S.C. section 360bbb-3(b)(1), unless the authorization is terminated or revoked.  Performed at [facility], [address], [place], [place] [address]   Blood culture (routine x 2)     Status: None (Preliminary result)   Collection Time: 11/12/24  3:35 AM   Specimen: BLOOD RIGHT HAND  Result Value Ref Range Status   Specimen Description BLOOD RIGHT HAND  Final   Special Requests   Final    BOTTLES DRAWN AEROBIC AND ANAEROBIC Blood Culture adequate volume   Culture   Final    NO GROWTH 4 DAYS Performed at [facility], [address], [place], [place] [address]    Report Status PENDING  Incomplete  Blood culture (routine x 2)     Status: None (Preliminary result)   Collection Time: 11/12/24  4:11 AM   Specimen: BLOOD RIGHT ARM  Result Value Ref Range Status   Specimen  Description BLOOD RIGHT ARM  Final   Special Requests   Final    BOTTLES DRAWN AEROBIC AND ANAEROBIC Blood Culture adequate volume   Culture   Final    NO GROWTH 4 DAYS Performed at [facility], [address], [place], [place] [address]    Report Status PENDING  Incomplete  Body fluid culture w Gram Stain     Status: None (Preliminary result)   Collection Time: 11/13/24 10:16 AM   Specimen: Lung, Left; Pleural Fluid  Result Value Ref Range Status   Specimen Description PLEURAL  Final   Special Requests LUNG,LEFT  Final   Gram Stain   Final    ABUNDANT WBC PRESENT, PREDOMINANTLY PMN ABUNDANT GRAM POSITIVE COCCI IN CHAINS  Culture   Final    ABUNDANT STREPTOCOCCUS INTERMEDIUS SUSCEPTIBILITIES TO FOLLOW Performed at [facility], [address] [place], [place] [address]    Report Status PENDING  Incomplete  Aerobic/Anaerobic Culture w Gram Stain (surgical/deep wound)     Status: None (Preliminary result)   Collection Time: 11/14/24 12:50 PM   Specimen: Path Tissue  Result Value Ref Range Status   Specimen Description TISSUE  Final   Special Requests PLEURAL LEFT  Final   Gram Stain   Final    ABUNDANT WBC PRESENT, PREDOMINANTLY PMN MODERATE GRAM POSITIVE COCCI IN CHAINS    Culture   Final    CULTURE REINCUBATED FOR BETTER GROWTH Performed at [facility], [address] [place], [place] [address]    Report Status PENDING  Incomplete     Radiology Studies: No results found.  Nilda Fendt, MD, PhD [facility]  Between 7 am - 7 pm I am available, please contact me via Amion (for emergencies) or Securechat (non urgent messages)  Between 7 pm - 7 am I am not available, please contact night coverage MD/APP via Amion

## 2024-11-16 NOTE — Progress Notes (Signed)
 Speech Language Pathology Treatment: Dysphagia  Patient Details Name: Jeffery [name] MRN: [id number] DOB: Aug 28, 1941 Today's Date: 11/16/2024 Time: 1340-1403 SLP Time Calculation (min) (ACUTE ONLY): 23 min  Assessment / Plan / Recommendation Clinical Impression  Pt describes independent use of strategies to mitigate adverse events in the presence of aspiration including oral care before PO intake, separating liquids and solids, and using EMST. Adjusted EMST settings to 25 cm/H2O, which pt rated 5/10 difficulty and completed independently x30. Throat clearance and delayed coughing follow sips of thin liquids, which may represent aspiration as it was sensed on MBS 12/1. Reinforced recommendations and education with pt and his family. Also discussed repeating an MBS after ongoing use of EMST (pt prefers to do this on an OP basis). Pt and his spouse are in agreement with plan and SLP will continue following.    HPI HPI: 83 yo male presenting to ED 12/1 with worsening shortness of breath, cough, and malaise. CT Chest shows diffuse pneumonia throughout the L lung, debris in the L mainstem bronchus and occluding the LLL bronchus and multiple lingual bronchi, and punctate hyperdensities in the LLL may be due to aspiration. MBS 12/1 showed sensed aspiration of thin liquids and frank penetration of nectar thick liquids with diffuse pharyngeal residuals. After further discussion with pt and MD, pt wished to continue regular diet and thin liquids. S/p thoracentesis 12/2 and chest tube placement 12/3. CT 01/26/2019 recommends f/u with SLP for an MBS due to tree in bud opacities and new patchy lower lobe foci of consolidation. CT Cervical Spine 2021 shows advanced multilevel degenerative disc disease and facet joint arthropathy with at least moderate canal stenosis most severely involving C5-6 and C6-7 and prominent anterior endplate osteophytes. PMH: asthma, bronchiectasis, nonischemic cardiomyopathy with EF 25-30%,  T2DM, high grade AV block with PPM, recently identified A-fib, GERD      SLP Plan  Continue with current plan of care        Swallow Evaluation Recommendations   Recommendations: PO diet PO Diet Recommendation: Regular;Thin liquids (Level 0) Liquid Administration via: Cup;Straw Medication Administration: Crushed with puree Supervision: Patient able to self-feed;Intermittent supervision/cueing for swallowing strategies Postural changes: Position pt fully upright for meals;Stay upright 30-60 min after meals Oral care recommendations: Oral care BID (2x/day);Oral care before PO     Recommendations                     Oral care QID;Oral care before and after PO   Set up Supervision/Assistance Dysphagia, pharyngeal phase (R13.13)     Continue with current plan of care     Damien Blumenthal, M.A., CCC-SLP Speech Language Pathology, Acute Rehabilitation Services  Secure Chat preferred [phone]   11/16/2024, 2:34 PM

## 2024-11-16 NOTE — Procedures (Signed)
 Pleural Fibrinolytic Administration Procedure Note  ANCHOR DWAN  [id number]  [birth year]  Date:11/16/24  Time:5:31 PM   Provider Performing:Vonzell Lindblad C Claudene   Procedure: Pleural Fibrinolysis Subsequent day [phone])  Indication(s) Fibrinolysis of complicated pleural effusion  Consent Risks of the procedure as well as the alternatives and risks of each were explained to the patient and/or caregiver.  Consent for the procedure was obtained.   Anesthesia None   Time Out Verified patient identification, verified procedure, site/side was marked, verified correct patient position, special equipment/implants available, medications/allergies/relevant history reviewed, required imaging and test results available.   Sterile Technique Hand hygiene, gloves   Procedure Description Existing pleural catheter was cleaned and accessed in sterile manner.  10mg  of tPA in 30cc of saline and 5mg  of dornase in 30cc of sterile water  were injected into pleural space using existing pleural catheter.  Catheter will be clamped for 1 hour and then placed back to suction.   Complications/Tolerance None; patient tolerated the procedure well.  EBL None   Specimen(s) None   Sherlean Claudene AGACNP-BC   [facility] 11/16/2024, 5:31 PM  Please see [email or web address] for pager details.  From 7A-7P if no response, please call [phone]. After hours, please call ELink [phone].

## 2024-11-16 NOTE — Progress Notes (Signed)
 NAME:  Jeffery [name], MRN:  [id number], DOB:  [birth year], LOS: 4 ADMISSION DATE:  11/12/2024, CONSULTATION DATE:  12/02 REFERRING MD:  Dr Trixie, CHIEF COMPLAINT:  shortness of breath    History of Present Illness:  Patient is a 83 yo M w/ pertinent PMH bronchiectasis, asthma, T2DM, type 2 2nd degree heart block s/p pacemaker placement, systolic chf, GERD presents to [facility] on 12/1 w/ pleural effusion.   Worsening dyspnea and cough over the past month. On 12/1 increased wob. EMS called found hypoxic and placed on supplemental o2 and given nebs. Taken to [facility] ED. On arrival sats 93% on 2 l/m [place]. Noted to have rales on left side. CXR showing LUL and LLL infiltrate. CT chest showing diffuse patchy infiltrates LUL and LLL; large left loculated pleural effusion concerning for empyema or parapneumonic effusion. Afebrile and wbc 26. Cultures obtained and started on broad spectrum abx. Swallow eval performed showing severe aspiration. Abx changed to unasyn  for possible aspiration pna. On 12/2 IR performed thora with only 150 green tinged purulent fluids. Pleural LDH >2,500. TNC count 1,970,000. PCCM consulted.    Pertinent  Medical History   Past Medical History:  Diagnosis Date   Arthritis    thumbs (07/25/2018)   Asthma    CHF (congestive heart failure) (HCC)    Chronic bronchitis (HCC)    Environmental allergies    GERD (gastroesophageal reflux disease)    Headache    seasonal; w/environmental allergies (07/25/2018)   History of blood transfusion    when I had laminectomy (07/25/2018)   HTN (hypertension)    Hyperlipidemia    LBBB (left bundle branch block) 1999   Metatarsal bone fracture 03/07/2018   Myocardial infarction [facility])    was told I've had an old MI; probably in the 1990s (07/25/2018)   Pneumonia    as a child, age 81; viral pneumonia 3 times in the last 10 years (07/25/2018)   Presence of permanent cardiac pacemaker 07/25/2018   Seasonal allergies    Sleep apnea     wife says I do (07/25/2018)   Type II diabetes mellitus (HCC)      Significant Hospital Events: Including procedures, antibiotic start and stop dates in addition to other pertinent events   12/2- left thoracentesis 12/02- left sided chest tube placement   Interim History / Subjective:   350 mL out chest tube yesterday  20 mL out chest tube today   Patient evaluated at bedside this morning. He states that he is feeling better. He states that his left side is still hurting but this is something that he can tolerate. He notes that he does have weaker legs but has been getting up and moving. He asks when he is going to go home.    Objective    Blood pressure 97/60, pulse 68, temperature 98.2 F (36.8 C), temperature source Oral, resp. rate 16, height 5' 9 (1.753 m), weight 67.6 kg, SpO2 98%.        Intake/Output Summary (Last 24 hours) at 11/16/2024 1532 Last data filed at 11/16/2024 1300 Gross per 24 hour  Intake 1838.65 ml  Output 1280 ml  Net 558.65 ml   Filed Weights   11/14/24 0402 11/15/24 0434 11/16/24 0337  Weight: 65.1 kg 66.5 kg 67.6 kg    Examination: General: Patient is resting comfortably in bed in no acute distress  Eyes: Pupils equal and reactive to light, EOM intact  Head: Normocephalic, atraumatic  Cardio: Regular rate and rhythm, no murmurs,  rubs or gallops Pulmonary: Chest tube in place. Draining little fluid. Rhonchi noted to the left lower long field  Assessment and Plan   Left sided PNA, concern for aspiration  Left sided empyema strep intermedius s/p thoracentesis with lytics and chest tube placement  Hx of bronchiectasis and asthma -Pleural LDH >2,500. TNC count 1,970,000, purulent, growing Streptococcus Intermedius, awaiting cultures -Output of 350 mL yesterday from chest tube and 80 mL today. Patient has now been weaned down to room air. Chest Xray today showing decreasing pleural effusion and decreased left basilar opacities. Will plan for one  more day of lytics.    Plan: -  Currently on unasyn  and doxycycline  -  Strep intermedius in culture, sensitivity pending -  Patient received lytics yesterday, tolerated well, will plan for more dose of lytics, could potentially remove tube tomorrow if patient is doing well  -  flush with 10 cc every 6 hrs -  pulm toilet -  cont home maintenance inhaler/nebs - aspiration precautions. Advised pt to follow speech therapy recommendations    Discussed plan with the patient and family at bedside    Labs   CBC: Recent Labs  Lab 11/12/24 0241 11/12/24 0246 11/13/24 0221 11/14/24 0235 11/15/24 0237 11/15/24 1758 11/16/24 0229  WBC 26.1*  --  23.3* 16.3* 12.9* 9.2 8.9  NEUTROABS 24.3*  --   --   --   --  7.7  --   HGB 12.0*   < > 10.3* 9.6* 10.4* 10.5* 9.6*  HCT 38.9*   < > 32.2* 29.7* 33.3* 33.8* 30.7*  MCV 81.7  --  79.1* 78.2* 80.4 81.3 79.9*  PLT 628*  --  445* 463* 518* 526* 456*   < > = values in this interval not displayed.    Basic Metabolic Panel: Recent Labs  Lab 11/12/24 0241 11/12/24 0246 11/13/24 0221 11/14/24 0235 11/15/24 0237 11/16/24 0229  NA 127* 127* 132* 132* 136 136  K 5.4* 5.1 4.8 3.8 4.1 3.7  CL 91*  --  94* 96* 100 100  CO2 19*  --  28 28 28 25   GLUCOSE 155*  --  182* 138* 129* 112*  BUN 25*  --  26* 28* 26* 19  CREATININE 1.07  --  1.16 0.93 0.93 0.83  CALCIUM  8.7*  --  8.5* 8.4* 8.3* 8.2*  MG  --   --  1.9  --  2.0 1.9  PHOS  --   --   --   --  2.8  --    GFR: Estimated Creatinine Clearance: 66.7 mL/min (by C-G formula based on SCr of 0.83 mg/dL). Recent Labs  Lab 11/12/24 0402 11/12/24 0718 11/13/24 0221 11/14/24 0235 11/15/24 0237 11/15/24 1758 11/16/24 0229  WBC  --   --    < > 16.3* 12.9* 9.2 8.9  LATICACIDVEN 3.1* 2.1*  --   --   --   --   --    < > = values in this interval not displayed.    Liver Function Tests: Recent Labs  Lab 11/13/24 0221 11/15/24 0237 11/16/24 0229  AST 39 66* 39  ALT 48* 57* 49*  ALKPHOS 111  103 91  BILITOT 0.5 0.5 0.4  PROT 5.9* 5.3* 5.4*  ALBUMIN 1.7* 1.7* 1.6*   No results for input(s): LIPASE, AMYLASE in the last 168 hours. No results for input(s): AMMONIA in the last 168 hours.  ABG    Component Value Date/Time   HCO3 23.4 11/12/2024 0246  TCO2 24 11/12/2024 0246   O2SAT 56 11/12/2024 0246     Coagulation Profile: Recent Labs  Lab 11/14/24 0939  INR 1.2    Cardiac Enzymes: No results for input(s): CKTOTAL, CKMB, CKMBINDEX, TROPONINI in the last 168 hours.  HbA1C: Hemoglobin A1C  Date/Time Value Ref Range Status  08/07/2024 09:14 AM 5.9 (A) 4.0 - 5.6 % Final   HbA1c, POC (prediabetic range)  Date/Time Value Ref Range Status  08/07/2024 09:14 AM 5.9 (A) 5.7 - 6.4 % Final   HbA1c, POC (controlled diabetic range)  Date/Time Value Ref Range Status  08/07/2024 09:14 AM 5.9 0.0 - 7.0 % Final   HbA1c POC (<> result, manual entry)  Date/Time Value Ref Range Status  08/07/2024 09:14 AM 5.9 4.0 - 5.6 % Final   Hgb A1c MFr Bld  Date/Time Value Ref Range Status  02/20/2024 10:37 AM 6.7 (H) 4.6 - 6.5 % Final    Comment:    Glycemic Control Guidelines for People with Diabetes:Non Diabetic:  <6%Goal of Therapy: <7%Additional Action Suggested:  >8%   08/23/2023 10:10 AM 6.6 (H) 4.6 - 6.5 % Final    Comment:    Glycemic Control Guidelines for People with Diabetes:Non Diabetic:  <6%Goal of Therapy: <7%Additional Action Suggested:  >8%     CBG: Recent Labs  Lab 11/16/24 0411 11/16/24 0433 11/16/24 0912 11/16/24 1212 11/16/24 1519  GLUCAP 116* 111* 141* 156* 145*     Past Medical History:  He,  has a past medical history of Arthritis, Asthma, CHF (congestive heart failure) (HCC), Chronic bronchitis (HCC), Environmental allergies, GERD (gastroesophageal reflux disease), Headache, History of blood transfusion, HTN (hypertension), Hyperlipidemia, LBBB (left bundle branch block) (1999), Metatarsal bone fracture (03/07/2018), Myocardial  infarction (HCC), Pneumonia, Presence of permanent cardiac pacemaker (07/25/2018), Seasonal allergies, Sleep apnea, and Type II diabetes mellitus (HCC).   Surgical History:   Past Surgical History:  Procedure Laterality Date   BACK SURGERY     BIV PACEMAKER INSERTION CRT-P  07/25/2018   BIV PACEMAKER INSERTION CRT-P N/A 07/25/2018   Procedure: BIV PACEMAKER INSERTION CRT-P;  Surgeon: Waddell Danelle ORN, MD;  Location: [facility] INVASIVE CV LAB;  Service: Cardiovascular;  Laterality: N/A;   CARDIAC CATHETERIZATION  2003   Dr Esmeralda Sharps; 85 % R circumflex obstruction   CARPAL TUNNEL RELEASE Left 10/11/2014   Procedure: LEFT CARPAL TUNNEL RELEASE;  Surgeon: Elsie Mussel, MD;  Location: [facility];  Service: Orthopedics;  Laterality: Left;   COLONOSCOPY W/ BIOPSIES AND POLYPECTOMY  2018   INGUINAL HERNIA REPAIR Right 1948   INGUINAL HERNIA REPAIR Left 1988   IR THORACENTESIS ASP PLEURAL SPACE W/IMG GUIDE  11/13/2024   LUMBAR LAMINECTOMY  1984   MINOR CARPAL TUNNEL Right 11/22/2014   Procedure: RIGHT LIMITED OPEN CARPAL TUNNEL RELEASE;  Surgeon: Elsie Mussel, MD;  Location: [facility];  Service: Orthopedics;  Laterality: Right;   RIGHT/LEFT HEART CATH AND CORONARY ANGIOGRAPHY N/A 10/08/2024   Procedure: RIGHT/LEFT HEART CATH AND CORONARY ANGIOGRAPHY;  Surgeon: Rolan Ezra RAMAN, MD;  Location: [facility] INVASIVE CV LAB;  Service: Cardiovascular;  Laterality: N/A;   TONSILLECTOMY  1958     Social History:   reports that he quit smoking about 60 years ago. His smoking use included cigarettes. He started smoking about 62 years ago. He has a 4 pack-year smoking history. He has never used smokeless tobacco. He reports current alcohol use of about 2.0 standard drinks of alcohol per week. He reports that he does not use drugs.  Family History:  His family history includes Colon polyps in his father; Diabetes in his maternal aunt; Heart attack in his father; Heart failure in his  mother; Hypertension in his mother; Subarachnoid hemorrhage (age of onset: 6) in his mother; Subarachnoid hemorrhage (age of onset: 55) in his paternal grandfather. There is no history of Colon cancer.   Allergies Allergies  Allergen Reactions   Contrast Media [Iodinated Contrast Media] Other (See Comments)    Redness and warm sensation, this reaction was noted on 02/27/13 during a Cardiac MRI per pt.  Pt sts he had erythema on his head, chest, and shoulders.  Pt had a pacemaker placed August 2019 and was pre-medicated for the dye and had no allergic reaction at that time. -Carissa Mozingo B.S. RT(R)(CT)     Home Medications  Prior to Admission medications   Medication Sig Start Date End Date Taking? Authorizing Provider  albuterol  (VENTOLIN  HFA) 108 (90 Base) MCG/ACT inhaler TAKE 2 PUFFS BY MOUTH EVERY 6 HOURS AS NEEDED FOR WHEEZE OR SHORTNESS OF BREATH 06/21/22  Yes Burns, Glade PARAS, MD  bisoprolol  (ZEBETA ) 5 MG tablet Take 0.5 tablets (2.5 mg total) by mouth daily. 09/12/24  Yes Rolan Ezra RAMAN, MD  dapagliflozin  propanediol (FARXIGA ) 10 MG TABS tablet Take 1 tablet (10 mg total) by mouth daily before breakfast. 12/20/23  Yes Court Dorn PARAS, MD  fluticasone -salmeterol (ADVAIR HFA) 230-21 MCG/ACT inhaler Inhale 2 puffs into the lungs 2 (two) times daily. 08/07/24  Yes Burns, Glade PARAS, MD  furosemide  (LASIX ) 40 MG tablet Take 0.5 tablets (20 mg total) by mouth daily. 11/07/24  Yes Rolan Ezra RAMAN, MD  glipiZIDE  (GLUCOTROL ) 5 MG tablet TAKE 1-2 TABLETS (5-10 MG TOTAL) BY MOUTH 2 (TWO) TIMES DAILY BEFORE A MEAL. 05/11/24  Yes Burns, Glade PARAS, MD  HYDROcodone -acetaminophen  (NORCO) 7.5-325 MG tablet Take 1 tablet by mouth every 6 (six) hours as needed for severe pain (pain score 7-10) (for chronic joint pain, neck pain). 11/07/24  Yes Burns, Glade PARAS, MD  ipratropium (ATROVENT ) 0.02 % nebulizer solution TAKE 2.5 ML (0.5 MG TOTAL) BY NEBULIZATION 4 TIMES A DAY Patient taking differently: Take 0.5 mg by  nebulization 2 (two) times daily as needed for shortness of breath or wheezing. 08/31/24  Yes Burns, Glade PARAS, MD  metFORMIN  (GLUCOPHAGE ) 500 MG tablet TAKE 1 TABLET BY MOUTH TWICE A DAY WITH FOOD 11/12/24  Yes Burns, Glade PARAS, MD  mirtazapine  (REMERON ) 7.5 MG tablet Take 1-2 tablets (7.5-15 mg total) by mouth at bedtime. Patient taking differently: Take 15 mg by mouth at bedtime. 10/30/24  Yes Burns, Glade PARAS, MD  montelukast  (SINGULAIR ) 10 MG tablet Take 1 tablet (10 mg total) by mouth at bedtime. 02/29/24  Yes Burns, Glade PARAS, MD  nitroGLYCERIN  (NITROSTAT ) 0.4 MG SL tablet Place 1 tablet (0.4 mg total) under the tongue every 5 (five) minutes x 3 doses as needed for chest pain. 03/22/23  Yes Hobart Powell BRAVO, MD  potassium chloride  20 MEQ TBCR Take 0.5 tablets (10 mEq total) by mouth daily. 07/19/24  Yes Court Dorn PARAS, MD  spironolactone  (ALDACTONE ) 25 MG tablet Take 1 tablet (25 mg total) by mouth daily. 10/18/24  Yes Rolan Ezra RAMAN, MD  traMADol  (ULTRAM ) 50 MG tablet Take 50-100 mg by mouth every 8 (eight) hours as needed for moderate pain (pain score 4-6) or severe pain (pain score 7-10).   Yes [provider]  valsartan  (DIOVAN ) 40 MG tablet Take 0.5 tablets (20 mg total) by mouth daily. 10/18/24  Yes Rolan Ezra RAMAN, MD  vitamin E 180 MG (400 UNITS) capsule Take 400 Units by mouth daily.   Yes [provider]  albuterol  (PROVENTIL ) (2.5 MG/3ML) 0.083% nebulizer solution Take 3 mLs (2.5 mg total) by nebulization every 6 (six) hours as needed for wheezing or shortness of breath. 04/24/24   Geofm Glade PARAS, MD  aspirin  EC 81 MG tablet Take 1 tablet (81 mg total) by mouth at bedtime. Swallow whole. 12/05/23   Danford, Lonni SQUIBB, MD  LORazepam  (ATIVAN ) 1 MG tablet TAKE 1/2 TO 1 TABLET BY MOUTH NIGHTLY AS NEEDED. 05/18/24   Burns, Glade PARAS, MD  rosuvastatin  (CRESTOR ) 10 MG tablet TAKE 1 TABLET ON MONDAY, WEDNESDAY & FRIDAY. 12/20/23   Court Dorn PARAS, MD     Critical care time: n/a     Libby Blanch, DO Internal Medicine Resident PGY-3

## 2024-11-16 NOTE — Progress Notes (Signed)
 Mobility Specialist Progress Note:    11/16/24 1211  Mobility  Activity Turned to back - supine (Ankle Pumps, Leg Lift, Leg ext)  Level of Assistance Standby assist, set-up cues, supervision of patient - no hands on  Assistive Device None  Range of Motion/Exercises Active  Activity Response Tolerated well  Mobility Referral Yes  Mobility visit 1 Mobility  Mobility Specialist Start Time (ACUTE ONLY) 1211  Mobility Specialist Stop Time (ACUTE ONLY) 1219  Mobility Specialist Time Calculation (min) (ACUTE ONLY) 8 min   Received pt laying in bed receiving treatment agreeable to bed mobility. No c/o any symptoms. Pt able to perform movements well w/ no assist. Left pt in bed w/ all needs met.   Venetia Keel Mobility Specialist Please Neurosurgeon or Rehab Office at [phone]

## 2024-11-17 ENCOUNTER — Inpatient Hospital Stay (HOSPITAL_COMMUNITY)

## 2024-11-17 DIAGNOSIS — J189 Pneumonia, unspecified organism: Secondary | ICD-10-CM | POA: Diagnosis not present

## 2024-11-17 LAB — CULTURE, BLOOD (ROUTINE X 2)
Culture: NO GROWTH
Culture: NO GROWTH
Special Requests: ADEQUATE
Special Requests: ADEQUATE

## 2024-11-17 LAB — GLUCOSE, CAPILLARY
Glucose-Capillary: 123 mg/dL — ABNORMAL HIGH (ref 70–99)
Glucose-Capillary: 120 mg/dL — ABNORMAL HIGH (ref 70–99)
Glucose-Capillary: 153 mg/dL — ABNORMAL HIGH (ref 70–99)
Glucose-Capillary: 167 mg/dL — ABNORMAL HIGH (ref 70–99)
Glucose-Capillary: 114 mg/dL — ABNORMAL HIGH (ref 70–99)

## 2024-11-17 MED ORDER — STERILE WATER FOR INJECTION IJ SOLN
5.0000 mg | Freq: Once | RESPIRATORY_TRACT | Status: AC
  Administered 2024-11-17: 5 mg via INTRAPLEURAL
  Filled 2024-11-17: qty 5

## 2024-11-17 MED ORDER — SODIUM CHLORIDE (PF) 0.9 % IJ SOLN
10.0000 mg | Freq: Once | INTRAMUSCULAR | Status: AC
  Administered 2024-11-17: 10 mg via INTRAPLEURAL
  Filled 2024-11-17: qty 10

## 2024-11-17 MED ORDER — INSULIN ASPART 100 UNIT/ML IJ SOLN: 0.0000 [IU] | Freq: Every day | INTRAMUSCULAR | Status: DC

## 2024-11-17 MED ORDER — INSULIN ASPART 100 UNIT/ML IJ SOLN
0.0000 [IU] | Freq: Three times a day (TID) | INTRAMUSCULAR | Status: DC
  Administered 2024-11-18 (×2): 1 [IU] via SUBCUTANEOUS
  Filled 2024-11-17: qty 1
  Filled 2024-11-17: qty 2

## 2024-11-17 NOTE — Progress Notes (Signed)
 PROGRESS NOTE  Jeffery [name] FMW:[id number] DOB: Feb 19, 1941 DOA: 11/12/2024 PCP: Geofm Glade PARAS, MD   LOS: 5 days   Brief Narrative / Interim history: 83 year old male with history of asthma, bronchiectasis, DM2, heart block status post pacemaker, chronic systolic CHF who comes into the hospital with complaints of cough, dyspnea, malaise ongoing for the last several weeks if not months.  He has been having poor p.o. intake and has lost a lot of weight during this period of time.  He was found to have pneumonia with a pleural effusion, and upon sampling it turned to be purulent and pulmonary was consulted.  Subjective / 24h Interval events: Doing well this morning.  He denies any significant shortness of breath.  Asking whether he needs home oxygen.  Assesement and Plan: Principal problem Left upper lobe pneumonia, empyema -underwent thoracentesis 12/2, found to have purulent fluid.  He is now status post IR guided chest tube placement.  Pulmonary following, appreciate input - Cultures with GPC's in chains, preliminary cultures show pansensitive Streptococcus.  He will need several weeks of Augmentin  per pulm - Chest tube management per pulmonary, received lytics yesterday, repeat chest x-ray this morning pending  Active problems Chronic systolic CHF -2D echo with LVEF 25-30%, global hypokinesis, mild LVH, RV systolic function with moderate reduction and enlargement.  Appears fairly euvolemic - Continue dapagliflozin , bisoprolol , statin.  No chest pain  Heart block-status post pacemaker  Oral thrush-continue nystatin , thrush appears clinically improved today and he has less discomfort  Diarrhea-possibly side effect from antibiotics.  No abdominal pain, no nausea or vomiting.  Placed on Florastor  Hyperlipidemia-continue statin  History of a flutter - not on anticoagulation, telemetry showed paced rhythm.  Continue bisoprolol  and aspirin   History of bronchiectasis/asthma -no  wheezing, felt to have exacerbation on admission and placed on prednisone , continue along with bronchodilators.  Hyponatremia-mild, monitor, no large shifts noted, now normalized  DM2 -continue sliding scale, CBGs reviewed and are acceptable  CBG (last 3)  Recent Labs    11/16/24 2347 11/17/24 0421 11/17/24 0727  GLUCAP 114* 123* 120*   Scheduled Meds:  alteplase  (CATHFLO ACTIVASE ) 10 mg in sodium chloride  (PF) 0.9 % 30 mL  10 mg Intrapleural Once   And   dornase alfa  (PULMOZYME ) 5 mg in sterile water  (preservative free) 30 mL  5 mg Intrapleural Once   aspirin  EC  81 mg Oral QHS   bisoprolol   2.5 mg Oral Daily   dapagliflozin  propanediol  10 mg Oral QAC breakfast   feeding supplement  237 mL Oral BID BM   fluticasone  furoate-vilanterol  1 puff Inhalation Daily   guaiFENesin   10 mL Oral TID   insulin  aspart  0-6 Units Subcutaneous Q4H   ipratropium  0.5 mg Nebulization Q6H   magic mouthwash w/lidocaine   10 mL Oral QID   montelukast   10 mg Oral QHS   nystatin   5 mL Mouth/Throat QID   polyethylene glycol  17 g Oral Daily   rosuvastatin   10 mg Oral Q M,W,F   saccharomyces boulardii  250 mg Oral BID   sodium chloride  flush  10 mL Intrapleural Q6H   sodium chloride  flush  3 mL Intravenous Q12H   Continuous Infusions:  ampicillin -sulbactam (UNASYN ) IV 3 g (11/17/24 0510)   PRN Meds:.acetaminophen  **OR** acetaminophen , albuterol , LORazepam , morphine  injection, oxyCODONE , prochlorperazine   Current Outpatient Medications  Medication Instructions   albuterol  (PROVENTIL ) 2.5 mg, Nebulization, Every 6 hours PRN   albuterol  (VENTOLIN  HFA) 108 (90 Base) MCG/ACT inhaler TAKE  2 PUFFS BY MOUTH EVERY 6 HOURS AS NEEDED FOR WHEEZE OR SHORTNESS OF BREATH   aspirin  EC 81 mg, Oral, Daily at bedtime, Swallow whole.   bisoprolol  (ZEBETA ) 2.5 mg, Oral, Daily   dapagliflozin  propanediol (FARXIGA ) 10 mg, Oral, Daily before breakfast   fluticasone -salmeterol (ADVAIR HFA) 230-21 MCG/ACT inhaler 2  puffs, Inhalation, 2 times daily   furosemide  (LASIX ) 20 mg, Oral, Daily   glipiZIDE  (GLUCOTROL ) 5-10 mg, Oral, 2 times daily before meals   HYDROcodone -acetaminophen  (NORCO) 7.5-325 MG tablet 1 tablet, Oral, Every 6 hours PRN   ipratropium (ATROVENT ) 0.02 % nebulizer solution TAKE 2.5 ML (0.5 MG TOTAL) BY NEBULIZATION 4 TIMES A DAY   LORazepam  (ATIVAN ) 1 MG tablet TAKE 1/2 TO 1 TABLET BY MOUTH NIGHTLY AS NEEDED.   metFORMIN  (GLUCOPHAGE ) 500 mg, Oral, 2 times daily with meals   mirtazapine  (REMERON ) 7.5-15 mg, Oral, Daily at bedtime   montelukast  (SINGULAIR ) 10 mg, Oral, Daily at bedtime   nitroGLYCERIN  (NITROSTAT ) 0.4 mg, Sublingual, Every 5 min x3 PRN   potassium chloride  20 MEQ TBCR 10 mEq, Oral, Daily   rosuvastatin  (CRESTOR ) 10 MG tablet TAKE 1 TABLET ON MONDAY, WEDNESDAY & FRIDAY.   spironolactone  (ALDACTONE ) 25 mg, Oral, Daily   traMADol  (ULTRAM ) 50-100 mg, Oral, Every 8 hours PRN   valsartan  (DIOVAN ) 20 mg, Oral, Daily   vitamin E (VITAMIN E) 400 Units, Oral, Daily    Diet Orders (From admission, onward)     Start     Ordered   11/14/24 1305  Diet Heart Room service appropriate? Yes; Fluid consistency: Thin  Diet effective now       Question Answer Comment  Room service appropriate? Yes   Fluid consistency: Thin      11/14/24 1304            DVT prophylaxis:    Lab Results  Component Value Date   PLT 456 (H) 11/16/2024      Code Status: Full Code  Family Communication: No family at bedside  Status is: Inpatient Remains inpatient appropriate because: Severity of illness  Level of care: Telemetry  Consultants:  PCCM IR  Objective: Vitals:   11/16/24 2032 11/16/24 2300 11/17/24 0118 11/17/24 0300  BP:  126/86  115/72  Pulse:  61  65  Resp:  18  18  Temp:  97.8 F (36.6 C)  97.9 F (36.6 C)  TempSrc:  Oral  Oral  SpO2: 93% 93% 92% 94%  Weight:    68.6 kg  Height:    5' 9 (1.753 m)    Intake/Output Summary (Last 24 hours) at 11/17/2024  1106 Last data filed at 11/17/2024 0700 Gross per 24 hour  Intake 1065.48 ml  Output 830 ml  Net 235.48 ml   Wt Readings from Last 3 Encounters:  11/17/24 68.6 kg  10/30/24 66.2 kg  10/18/24 68.9 kg    Examination:  Constitutional: NAD Eyes: lids and conjunctivae normal, no scleral icterus ENMT: mmm Neck: normal, supple Respiratory: clear to auscultation bilaterally, no wheezing, no crackles. Normal respiratory effort.  Cardiovascular: Regular rate and rhythm, no murmurs / rubs / gallops. No LE edema. Abdomen: soft, no distention, no tenderness. Bowel sounds positive.   Data Reviewed: I have independently reviewed following labs and imaging studies   CBC Recent Labs  Lab 11/12/24 0241 11/12/24 0246 11/13/24 0221 11/14/24 0235 11/15/24 0237 11/15/24 1758 11/16/24 0229  WBC 26.1*  --  23.3* 16.3* 12.9* 9.2 8.9  HGB 12.0*   < > 10.3*  9.6* 10.4* 10.5* 9.6*  HCT 38.9*   < > 32.2* 29.7* 33.3* 33.8* 30.7*  PLT 628*  --  445* 463* 518* 526* 456*  MCV 81.7  --  79.1* 78.2* 80.4 81.3 79.9*  MCH 25.2*  --  25.3* 25.3* 25.1* 25.2* 25.0*  MCHC 30.8  --  32.0 32.3 31.2 31.1 31.3  RDW 17.4*  --  17.0* 17.1* 17.3* 17.2* 17.2*  LYMPHSABS 1.0  --   --   --   --  0.5*  --   MONOABS 0.8  --   --   --   --  0.9  --   EOSABS 0.0  --   --   --   --  0.0  --   BASOSABS 0.0  --   --   --   --  0.0  --    < > = values in this interval not displayed.    Recent Labs  Lab 11/12/24 0241 11/12/24 0246 11/12/24 0402 11/12/24 0718 11/13/24 0221 11/14/24 0235 11/14/24 0939 11/15/24 0237 11/16/24 0229  NA 127* 127*  --   --  132* 132*  --  136 136  K 5.4* 5.1  --   --  4.8 3.8  --  4.1 3.7  CL 91*  --   --   --  94* 96*  --  100 100  CO2 19*  --   --   --  28 28  --  28 25  GLUCOSE 155*  --   --   --  182* 138*  --  129* 112*  BUN 25*  --   --   --  26* 28*  --  26* 19  CREATININE 1.07  --   --   --  1.16 0.93  --  0.93 0.83  CALCIUM  8.7*  --   --   --  8.5* 8.4*  --  8.3* 8.2*  AST   --   --   --   --  39  --   --  66* 39  ALT  --   --   --   --  48*  --   --  57* 49*  ALKPHOS  --   --   --   --  111  --   --  103 91  BILITOT  --   --   --   --  0.5  --   --  0.5 0.4  ALBUMIN  --   --   --   --  1.7*  --   --  1.7* 1.6*  MG  --   --   --   --  1.9  --   --  2.0 1.9  LATICACIDVEN  --   --  3.1* 2.1*  --   --   --   --   --   INR  --   --   --   --   --   --  1.2  --   --   BNP 524.6*  --   --   --   --   --   --   --   --     ------------------------------------------------------------------------------------------------------------------ No results for input(s): CHOL, HDL, LDLCALC, TRIG, CHOLHDL, LDLDIRECT in the last 72 hours.  Lab Results  Component Value Date   HGBA1C 5.9 (A) 08/07/2024   HGBA1C 5.9 08/07/2024   HGBA1C 5.9 (A) 08/07/2024   HGBA1C 5.9 08/07/2024   ------------------------------------------------------------------------------------------------------------------  No results for input(s): TSH, T4TOTAL, T3FREE, THYROIDAB in the last 72 hours.  Invalid input(s): FREET3  Cardiac Enzymes No results for input(s): CKMB, TROPONINI, MYOGLOBIN in the last 168 hours.  Invalid input(s): CK ------------------------------------------------------------------------------------------------------------------    Component Value Date/Time   BNP 524.6 (H) 11/12/2024 0241    CBG: Recent Labs  Lab 11/16/24 1212 11/16/24 1519 11/16/24 2347 11/17/24 0421 11/17/24 0727  GLUCAP 156* 145* 114* 123* 120*    Recent Results (from the past 240 hours)  Resp panel by RT-PCR (RSV, Flu A&B, Covid) Anterior Nasal Swab     Status: None   Collection Time: 11/12/24  2:41 AM   Specimen: Anterior Nasal Swab  Result Value Ref Range Status   SARS Coronavirus 2 by RT PCR NEGATIVE NEGATIVE Final   Influenza A by PCR NEGATIVE NEGATIVE Final   Influenza B by PCR NEGATIVE NEGATIVE Final    Comment: (NOTE) The Xpert Xpress SARS-CoV-2/FLU/RSV  plus assay is intended as an aid in the diagnosis of influenza from Nasopharyngeal swab specimens and should not be used as a sole basis for treatment. Nasal washings and aspirates are unacceptable for Xpert Xpress SARS-CoV-2/FLU/RSV testing.  Fact Sheet for Patients: bloggercourse.com  Fact Sheet for Healthcare Providers: seriousbroker.it  This test is not yet approved or cleared by the [place]  FDA and has been authorized for detection and/or diagnosis of SARS-CoV-2 by FDA under an Emergency Use Authorization (EUA). This EUA will remain in effect (meaning this test can be used) for the duration of the COVID-19 declaration under Section 564(b)(1) of the Act, 21 U.S.C. section 360bbb-3(b)(1), unless the authorization is terminated or revoked.     Resp Syncytial Virus by PCR NEGATIVE NEGATIVE Final    Comment: (NOTE) Fact Sheet for Patients: bloggercourse.com  Fact Sheet for Healthcare Providers: seriousbroker.it  This test is not yet approved or cleared by the [place]  FDA and has been authorized for detection and/or diagnosis of SARS-CoV-2 by FDA under an Emergency Use Authorization (EUA). This EUA will remain in effect (meaning this test can be used) for the duration of the COVID-19 declaration under Section 564(b)(1) of the Act, 21 U.S.C. section 360bbb-3(b)(1), unless the authorization is terminated or revoked.  Performed at [facility], [address], [place], [place] [address]   Blood culture (routine x 2)     Status: None   Collection Time: 11/12/24  3:35 AM   Specimen: BLOOD RIGHT HAND  Result Value Ref Range Status   Specimen Description BLOOD RIGHT HAND  Final   Special Requests   Final    BOTTLES DRAWN AEROBIC AND ANAEROBIC Blood Culture adequate volume   Culture   Final    NO GROWTH 5 DAYS Performed at [facility], [address], [place], [place] [address]    Report Status 11/17/2024 FINAL  Final  Blood culture (routine x 2)     Status: None   Collection Time: 11/12/24  4:11 AM   Specimen: BLOOD RIGHT ARM  Result Value Ref Range Status   Specimen Description BLOOD RIGHT ARM  Final   Special Requests   Final    BOTTLES DRAWN AEROBIC AND ANAEROBIC Blood Culture adequate volume   Culture   Final    NO GROWTH 5 DAYS Performed at [facility], [address], [place], [place] [address]    Report Status 11/17/2024 FINAL  Final  Body fluid culture w Gram Stain     Status: None   Collection  Time: 11/13/24 10:16 AM   Specimen: Lung, Left; Pleural Fluid  Result Value Ref Range Status   Specimen Description PLEURAL  Final   Special Requests LUNG,LEFT  Final   Gram Stain   Final    ABUNDANT WBC PRESENT, PREDOMINANTLY PMN ABUNDANT GRAM POSITIVE COCCI IN CHAINS Performed at [facility], [address], [place], [place] [address]    Culture ABUNDANT STREPTOCOCCUS INTERMEDIUS  Final   Report Status 11/16/2024 FINAL  Final   Organism ID, Bacteria STREPTOCOCCUS INTERMEDIUS  Final      Susceptibility   Streptococcus intermedius - MIC*    PENICILLIN <=0.06 SENSITIVE Sensitive     CEFTRIAXONE  <=0.12 SENSITIVE Sensitive     ERYTHROMYCIN <=0.12 SENSITIVE Sensitive     LEVOFLOXACIN  0.5 SENSITIVE Sensitive     VANCOMYCIN 0.5 SENSITIVE Sensitive     * ABUNDANT STREPTOCOCCUS INTERMEDIUS  Aerobic/Anaerobic Culture w Gram Stain (surgical/deep wound)     Status: None (Preliminary result)   Collection Time: 11/14/24 12:50 PM   Specimen: Path Tissue  Result Value Ref Range Status   Specimen Description TISSUE  Final   Special Requests PLEURAL LEFT  Final   Gram Stain   Final    ABUNDANT WBC PRESENT, PREDOMINANTLY PMN MODERATE GRAM POSITIVE COCCI IN CHAINS    Culture   Final    ABUNDANT STREPTOCOCCUS INTERMEDIUS SUSCEPTIBILITIES TO FOLLOW Performed at [facility], [address], [place], [place]  [address]    Report Status PENDING  Incomplete     Radiology Studies: No results found.  Nilda Fendt, MD, PhD [facility]  Between 7 am - 7 pm I am available, please contact me via Amion (for emergencies) or Securechat (non urgent messages)  Between 7 pm - 7 am I am not available, please contact night coverage MD/APP via Amion

## 2024-11-17 NOTE — Progress Notes (Addendum)
 Patient's chest tube open to fluid receptacle  after alteplase  added to collect more secretions. Unclamping led to some immediate drainage. Suction is still on,

## 2024-11-17 NOTE — Procedures (Signed)
 Pleural Fibrinolytic Administration Procedure Note  Jeffery [name]  [id number]  Jun 10, 1941  Date:11/17/24  Time:12:39 PM   Provider Performing:Daja Shuping D. Harris   Procedure: Pleural Fibrinolysis Subsequent day (67437)  Indication(s) Fibrinolysis of complicated pleural effusion  Consent Risks of the procedure as well as the alternatives and risks of each were explained to the patient and/or caregiver.  Consent for the procedure was obtained.   Anesthesia None   Time Out Verified patient identification, verified procedure, site/side was marked, verified correct patient position, special equipment/implants available, medications/allergies/relevant history reviewed, required imaging and test results available.   Sterile Technique Hand hygiene, gloves   Procedure Description Existing pleural catheter was cleaned and accessed in sterile manner.  10mg  of tPA in 30cc of saline and 5mg  of dornase in 30cc of sterile water  were injected into pleural space using existing pleural catheter.  Catheter will be clamped for 1 hour and then placed back to suction.   Complications/Tolerance None; patient tolerated the procedure well.  EBL None   Specimen(s) None  Jeffery [name] D. Harris, NP-C [facility] Personal contact information can be found on Amion  If no contact or response made please call [phone] 11/17/2024, 12:39 PM

## 2024-11-17 NOTE — Progress Notes (Signed)
 NAME:  Jeffery [name], MRN:  [id number], DOB:  1941/04/20, LOS: 5 ADMISSION DATE:  11/12/2024, CONSULTATION DATE:  12/02 REFERRING MD:  Dr Trixie, CHIEF COMPLAINT:  shortness of breath    History of Present Illness:  Patient is a 83 yo M w/ pertinent PMH bronchiectasis, asthma, T2DM, type 2 2nd degree heart block s/p pacemaker placement, systolic chf, GERD presents to [facility] on 12/1 w/ pleural effusion.   Worsening dyspnea and cough over the past month. On 12/1 increased wob. EMS called found hypoxic and placed on supplemental o2 and given nebs. Taken to [facility] ED. On arrival sats 93% on 2 l/m [place]. Noted to have rales on left side. CXR showing LUL and LLL infiltrate. CT chest showing diffuse patchy infiltrates LUL and LLL; large left loculated pleural effusion concerning for empyema or parapneumonic effusion. Afebrile and wbc 26. Cultures obtained and started on broad spectrum abx. Swallow eval performed showing severe aspiration. Abx changed to unasyn  for possible aspiration pna. On 12/2 IR performed thora with only 150 green tinged purulent fluids. Pleural LDH >2,500. TNC count 1,970,000. PCCM consulted.    Pertinent  Medical History   Past Medical History:  Diagnosis Date   Arthritis    thumbs (07/25/2018)   Asthma    CHF (congestive heart failure) (HCC)    Chronic bronchitis (HCC)    Environmental allergies    GERD (gastroesophageal reflux disease)    Headache    seasonal; w/environmental allergies (07/25/2018)   History of blood transfusion    when I had laminectomy (07/25/2018)   HTN (hypertension)    Hyperlipidemia    LBBB (left bundle branch block) 1999   Metatarsal bone fracture 03/07/2018   Myocardial infarction [facility])    was told I've had an old MI; probably in the 1990s (07/25/2018)   Pneumonia    as a child, age 58; viral pneumonia 3 times in the last 10 years (07/25/2018)   Presence of permanent cardiac pacemaker 07/25/2018   Seasonal allergies    Sleep apnea     wife says I do (07/25/2018)   Type II diabetes mellitus (HCC)      Significant Hospital Events: Including procedures, antibiotic start and stop dates in addition to other pertinent events   12/2- left thoracentesis per IR with 150 mL of green tinge purulent fluid removed 12/3- left sided chest tube placement per IR 12/4 first dose of pleural lytics, 350 mL out chest tube 12/5 second dose of pleural lytics, 80 mL out chest tube  12/6 230 mL reported out of chest tube in last 24 hours  Interim History / Subjective:  Seen sitting up in bed with no acute complaints   Objective    Blood pressure 115/72, pulse 65, temperature 97.9 F (36.6 C), temperature source Oral, resp. rate 18, height 5' 9 (1.753 m), weight 68.6 kg, SpO2 94%.    FiO2 (%):  [21 %] 21 %   Intake/Output Summary (Last 24 hours) at 11/17/2024 0805 Last data filed at 11/17/2024 0700 Gross per 24 hour  Intake 1185.48 ml  Output 830 ml  Net 355.48 ml   Filed Weights   11/15/24 0434 11/16/24 0337 11/17/24 0300  Weight: 66.5 kg 67.6 kg 68.6 kg    Examination: General: Acute on chronically ill appearing elderly male sitting up in, in NAD HEENT: [place]/AT, MM pink/moist, PERRL,  Neuro: Alert and oriented x3, non-focal  CV: s1s2 regular rate and rhythm, no murmur, rubs, or gallops,  PULM:  Clear to auscultation,  no increased work of breathing, chest tube in place  GI: soft, bowel sounds active in all 4 quadrants, non-tender, non-distended, tolerating oral diet  Extremities: warm/dry, no edema  Skin: no rashes or lesions  Assessment and Plan   Left sided PNA, concern for aspiration  Left sided empyema strep intermedius s/p thoracentesis with lytics and chest tube placement  Hx of bronchiectasis and asthma -Pleural LDH >2,500. TNC count 1,970,000, purulent,.  Pleural culture with abundant pansensitive Streptococcus intermedius P: Repeat pleural lytics today  Routine chest tube care Continue -20 suction Monitor chest  tube output Flush chest tube per order set Awaiting a.m. chest x-ray to determine need for repeat lytics Continue Unasyn , transition to p.o. Augmentin  at discharge to continue 4 to 6 weeks Mobilize as able Aspiration precautions  Critical care time:   Tammey Deeg D. Harris, NP-C [facility] Personal contact information can be found on Amion  If no contact or response made please call [phone] 11/17/2024, 8:12 AM

## 2024-11-18 ENCOUNTER — Inpatient Hospital Stay (HOSPITAL_COMMUNITY)

## 2024-11-18 ENCOUNTER — Telehealth: Payer: Self-pay | Admitting: Acute Care

## 2024-11-18 DIAGNOSIS — Z950 Presence of cardiac pacemaker: Secondary | ICD-10-CM | POA: Diagnosis not present

## 2024-11-18 DIAGNOSIS — J90 Pleural effusion, not elsewhere classified: Secondary | ICD-10-CM | POA: Diagnosis not present

## 2024-11-18 DIAGNOSIS — J869 Pyothorax without fistula: Secondary | ICD-10-CM

## 2024-11-18 DIAGNOSIS — J189 Pneumonia, unspecified organism: Secondary | ICD-10-CM | POA: Diagnosis not present

## 2024-11-18 DIAGNOSIS — J929 Pleural plaque without asbestos: Secondary | ICD-10-CM | POA: Diagnosis not present

## 2024-11-18 DIAGNOSIS — J939 Pneumothorax, unspecified: Secondary | ICD-10-CM | POA: Diagnosis not present

## 2024-11-18 DIAGNOSIS — R918 Other nonspecific abnormal finding of lung field: Secondary | ICD-10-CM | POA: Diagnosis not present

## 2024-11-18 DIAGNOSIS — Z4682 Encounter for fitting and adjustment of non-vascular catheter: Secondary | ICD-10-CM | POA: Diagnosis not present

## 2024-11-18 LAB — CBC
HCT: 31.2 % — ABNORMAL LOW (ref 39.0–52.0)
Hemoglobin: 9.7 g/dL — ABNORMAL LOW (ref 13.0–17.0)
MCH: 25 pg — ABNORMAL LOW (ref 26.0–34.0)
MCHC: 31.1 g/dL (ref 30.0–36.0)
MCV: 80.4 fL (ref 80.0–100.0)
Platelets: 462 K/uL — ABNORMAL HIGH (ref 150–400)
RBC: 3.88 MIL/uL — ABNORMAL LOW (ref 4.22–5.81)
RDW: 17.4 % — ABNORMAL HIGH (ref 11.5–15.5)
WBC: 10.7 K/uL — ABNORMAL HIGH (ref 4.0–10.5)
nRBC: 0 % (ref 0.0–0.2)

## 2024-11-18 LAB — MAGNESIUM: Magnesium: 1.8 mg/dL (ref 1.7–2.4)

## 2024-11-18 LAB — BASIC METABOLIC PANEL WITH GFR
Anion gap: 6 (ref 5–15)
BUN: 12 mg/dL (ref 8–23)
CO2: 30 mmol/L (ref 22–32)
Calcium: 8.1 mg/dL — ABNORMAL LOW (ref 8.9–10.3)
Chloride: 100 mmol/L (ref 98–111)
Creatinine, Ser: 0.92 mg/dL (ref 0.61–1.24)
GFR, Estimated: >60 mL/min (ref >60)
Glucose, Bld: 111 mg/dL — ABNORMAL HIGH (ref 70–99)
Potassium: 4.2 mmol/L (ref 3.5–5.1)
Sodium: 136 mmol/L (ref 135–145)

## 2024-11-18 LAB — GLUCOSE, CAPILLARY
Glucose-Capillary: 133 mg/dL — ABNORMAL HIGH (ref 70–99)
Glucose-Capillary: 165 mg/dL — ABNORMAL HIGH (ref 70–99)
Glucose-Capillary: 172 mg/dL — ABNORMAL HIGH (ref 70–99)
Glucose-Capillary: 152 mg/dL — ABNORMAL HIGH (ref 70–99)

## 2024-11-18 MED ORDER — FUROSEMIDE 10 MG/ML IJ SOLN
40.0000 mg | Freq: Every day | INTRAMUSCULAR | Status: DC
  Administered 2024-11-18 – 2024-11-19 (×2): 40 mg via INTRAVENOUS
  Filled 2024-11-18 (×2): qty 4

## 2024-11-18 NOTE — Progress Notes (Signed)
 Mobility Specialist Progress Note:    11/18/24 1300  Mobility  Activity Ambulated with assistance  Level of Assistance Contact guard assist, steadying assist  Assistive Device Front wheel walker  Distance Ambulated (ft) 200 ft  Activity Response Tolerated well  Mobility Referral Yes  Mobility visit 1 Mobility  Mobility Specialist Start Time (ACUTE ONLY) 1000  Mobility Specialist Stop Time (ACUTE ONLY) 1015  Mobility Specialist Time Calculation (min) (ACUTE ONLY) 15 min   Received pt in chair having no complaints and agreeable to mobility. Pt was asymptomatic throughout ambulation and returned to room w/o fault. Left in chair w/ call bell in reach and all needs met.   Thersia Minder Mobility Specialist  Please contact vis Secure Chat or  Rehab Office [phone]

## 2024-11-18 NOTE — Progress Notes (Signed)
 CXR reviewed and overall stable with chest tube on water  seal. Chest tube pulled. Will obtain CXR in am. Advised patient to alert staff if chest pain, shortness of breath or distress. If CXR stable in AM ok to discharge from pulmonary standpoint.  Pulmonary will see if patient still inpatient.

## 2024-11-18 NOTE — Plan of Care (Signed)
 Problem: Education: Goal: Knowledge of General Education information will improve Description: Including pain rating scale, medication(s)/side effects and non-pharmacologic comfort measures 11/18/2024 1341 by Connell Lucrecia SQUIBB, RN Outcome: Progressing 11/18/2024 1335 by Connell Lucrecia SQUIBB, RN Outcome: Progressing   Problem: Health Behavior/Discharge Planning: Goal: Ability to manage health-related needs will improve 11/18/2024 1341 by Connell Lucrecia SQUIBB, RN Outcome: Progressing 11/18/2024 1335 by Connell Lucrecia SQUIBB, RN Outcome: Progressing   Problem: Clinical Measurements: Goal: Ability to maintain clinical measurements within normal limits will improve 11/18/2024 1341 by Connell Lucrecia SQUIBB, RN Outcome: Progressing 11/18/2024 1335 by Connell Lucrecia SQUIBB, RN Outcome: Progressing Goal: Will remain free from infection 11/18/2024 1341 by Connell Lucrecia SQUIBB, RN Outcome: Progressing 11/18/2024 1335 by Connell Lucrecia SQUIBB, RN Outcome: Progressing Goal: Diagnostic test results will improve 11/18/2024 1341 by Connell Lucrecia SQUIBB, RN Outcome: Progressing 11/18/2024 1335 by Connell Lucrecia SQUIBB, RN Outcome: Progressing Goal: Respiratory complications will improve 11/18/2024 1341 by Connell Lucrecia SQUIBB, RN Outcome: Progressing 11/18/2024 1335 by Connell Lucrecia SQUIBB, RN Outcome: Progressing Goal: Cardiovascular complication will be avoided 11/18/2024 1341 by Connell Lucrecia SQUIBB, RN Outcome: Progressing 11/18/2024 1335 by Connell Lucrecia SQUIBB, RN Outcome: Progressing   Problem: Activity: Goal: Risk for activity intolerance will decrease 11/18/2024 1341 by Connell Lucrecia SQUIBB, RN Outcome: Progressing 11/18/2024 1335 by Connell Lucrecia SQUIBB, RN Outcome: Progressing   Problem: Nutrition: Goal: Adequate nutrition will be maintained 11/18/2024 1341 by Connell Lucrecia SQUIBB, RN Outcome: Progressing 11/18/2024 1335 by Connell Lucrecia SQUIBB, RN Outcome: Progressing   Problem: Coping: Goal: Level of anxiety will decrease 11/18/2024 1341 by Connell Lucrecia SQUIBB, RN Outcome:  Progressing 11/18/2024 1335 by Connell Lucrecia SQUIBB, RN Outcome: Not Progressing   Problem: Elimination: Goal: Will not experience complications related to bowel motility 11/18/2024 1341 by Connell Lucrecia SQUIBB, RN Outcome: Progressing 11/18/2024 1335 by Connell Lucrecia SQUIBB, RN Outcome: Progressing Goal: Will not experience complications related to urinary retention 11/18/2024 1341 by Connell Lucrecia SQUIBB, RN Outcome: Progressing 11/18/2024 1335 by Connell Lucrecia SQUIBB, RN Outcome: Progressing   Problem: Pain Managment: Goal: General experience of comfort will improve and/or be controlled 11/18/2024 1341 by Connell Lucrecia SQUIBB, RN Outcome: Progressing 11/18/2024 1335 by Connell Lucrecia SQUIBB, RN Outcome: Progressing   Problem: Safety: Goal: Ability to remain free from injury will improve 11/18/2024 1341 by Connell Lucrecia SQUIBB, RN Outcome: Progressing 11/18/2024 1335 by Connell Lucrecia SQUIBB, RN Outcome: Progressing   Problem: Skin Integrity: Goal: Risk for impaired skin integrity will decrease 11/18/2024 1341 by Connell Lucrecia SQUIBB, RN Outcome: Progressing 11/18/2024 1335 by Connell Lucrecia SQUIBB, RN Outcome: Progressing   Problem: Activity: Goal: Ability to tolerate increased activity will improve 11/18/2024 1341 by Connell Lucrecia SQUIBB, RN Outcome: Progressing 11/18/2024 1335 by Connell Lucrecia SQUIBB, RN Outcome: Progressing   Problem: Clinical Measurements: Goal: Ability to maintain a body temperature in the normal range will improve 11/18/2024 1341 by Connell Lucrecia SQUIBB, RN Outcome: Progressing 11/18/2024 1335 by Connell Lucrecia SQUIBB, RN Outcome: Progressing   Problem: Respiratory: Goal: Ability to maintain adequate ventilation will improve 11/18/2024 1341 by Connell Lucrecia SQUIBB, RN Outcome: Progressing 11/18/2024 1335 by Connell Lucrecia SQUIBB, RN Outcome: Progressing Goal: Ability to maintain a clear airway will improve 11/18/2024 1341 by Connell Lucrecia SQUIBB, RN Outcome: Progressing 11/18/2024 1335 by Connell Lucrecia SQUIBB, RN Outcome: Progressing   Problem:  Education: Goal: Ability to describe self-care measures that may prevent or decrease complications (Diabetes Survival Skills Education) will improve 11/18/2024 1341 by Connell Lucrecia SQUIBB, RN Outcome: Progressing 11/18/2024 1335 by Nare Gaspari  P, RN Outcome: Progressing Goal: Individualized Educational Video(s) 11/18/2024 1341 by Connell Lucrecia SQUIBB, RN Outcome: Progressing 11/18/2024 1335 by Connell Lucrecia SQUIBB, RN Outcome: Progressing   Problem: Coping: Goal: Ability to adjust to condition or change in health will improve 11/18/2024 1341 by Connell Lucrecia SQUIBB, RN Outcome: Progressing 11/18/2024 1335 by Connell Lucrecia SQUIBB, RN Outcome: Progressing   Problem: Fluid Volume: Goal: Ability to maintain a balanced intake and output will improve 11/18/2024 1341 by Connell Lucrecia SQUIBB, RN Outcome: Progressing 11/18/2024 1335 by Connell Lucrecia SQUIBB, RN Outcome: Progressing   Problem: Health Behavior/Discharge Planning: Goal: Ability to identify and utilize available resources and services will improve 11/18/2024 1341 by Connell Lucrecia SQUIBB, RN Outcome: Progressing 11/18/2024 1335 by Connell Lucrecia SQUIBB, RN Outcome: Progressing Goal: Ability to manage health-related needs will improve 11/18/2024 1341 by Connell Lucrecia SQUIBB, RN Outcome: Progressing 11/18/2024 1335 by Connell Lucrecia SQUIBB, RN Outcome: Progressing   Problem: Metabolic: Goal: Ability to maintain appropriate glucose levels will improve 11/18/2024 1341 by Connell Lucrecia SQUIBB, RN Outcome: Progressing 11/18/2024 1335 by Connell Lucrecia SQUIBB, RN Outcome: Progressing   Problem: Nutritional: Goal: Maintenance of adequate nutrition will improve 11/18/2024 1341 by Connell Lucrecia SQUIBB, RN Outcome: Progressing 11/18/2024 1335 by Connell Lucrecia SQUIBB, RN Outcome: Progressing Goal: Progress toward achieving an optimal weight will improve 11/18/2024 1341 by Connell Lucrecia SQUIBB, RN Outcome: Progressing 11/18/2024 1335 by Connell Lucrecia SQUIBB, RN Outcome: Progressing   Problem: Skin Integrity: Goal: Risk  for impaired skin integrity will decrease 11/18/2024 1341 by Connell Lucrecia SQUIBB, RN Outcome: Progressing 11/18/2024 1335 by Connell Lucrecia SQUIBB, RN Outcome: Progressing   Problem: Tissue Perfusion: Goal: Adequacy of tissue perfusion will improve 11/18/2024 1341 by Connell Lucrecia SQUIBB, RN Outcome: Progressing 11/18/2024 1335 by Connell Lucrecia SQUIBB, RN Outcome: Progressing   Problem: Education: Goal: Knowledge of General Education information will improve Description: Including pain rating scale, medication(s)/side effects and non-pharmacologic comfort measures 11/18/2024 1341 by Connell Lucrecia SQUIBB, RN Outcome: Progressing 11/18/2024 1335 by Connell Lucrecia SQUIBB, RN Outcome: Progressing   Problem: Health Behavior/Discharge Planning: Goal: Ability to manage health-related needs will improve 11/18/2024 1341 by Connell Lucrecia SQUIBB, RN Outcome: Progressing 11/18/2024 1335 by Connell Lucrecia SQUIBB, RN Outcome: Progressing   Problem: Clinical Measurements: Goal: Ability to maintain clinical measurements within normal limits will improve 11/18/2024 1341 by Connell Lucrecia SQUIBB, RN Outcome: Progressing 11/18/2024 1335 by Connell Lucrecia SQUIBB, RN Outcome: Progressing Goal: Will remain free from infection 11/18/2024 1341 by Connell Lucrecia SQUIBB, RN Outcome: Progressing 11/18/2024 1335 by Connell Lucrecia SQUIBB, RN Outcome: Progressing Goal: Diagnostic test results will improve 11/18/2024 1341 by Connell Lucrecia SQUIBB, RN Outcome: Progressing 11/18/2024 1335 by Connell Lucrecia SQUIBB, RN Outcome: Progressing Goal: Respiratory complications will improve 11/18/2024 1341 by Connell Lucrecia SQUIBB, RN Outcome: Progressing 11/18/2024 1335 by Connell Lucrecia SQUIBB, RN Outcome: Progressing Goal: Cardiovascular complication will be avoided 11/18/2024 1341 by Connell Lucrecia SQUIBB, RN Outcome: Progressing 11/18/2024 1335 by Connell Lucrecia SQUIBB, RN Outcome: Progressing   Problem: Activity: Goal: Risk for activity intolerance will decrease 11/18/2024 1341 by Connell Lucrecia SQUIBB, RN Outcome:  Progressing 11/18/2024 1335 by Connell Lucrecia SQUIBB, RN Outcome: Progressing   Problem: Nutrition: Goal: Adequate nutrition will be maintained 11/18/2024 1341 by Connell Lucrecia SQUIBB, RN Outcome: Progressing 11/18/2024 1335 by Connell Lucrecia SQUIBB, RN Outcome: Progressing   Problem: Coping: Goal: Level of anxiety will decrease 11/18/2024 1341 by Connell Lucrecia SQUIBB, RN Outcome: Progressing 11/18/2024 1335 by Connell Lucrecia SQUIBB, RN Outcome: Not Progressing   Problem: Elimination: Goal: Will  not experience complications related to bowel motility 11/18/2024 1341 by Connell Lucrecia SQUIBB, RN Outcome: Progressing 11/18/2024 1335 by Connell Lucrecia SQUIBB, RN Outcome: Progressing Goal: Will not experience complications related to urinary retention 11/18/2024 1341 by Connell Lucrecia SQUIBB, RN Outcome: Progressing 11/18/2024 1335 by Connell Lucrecia SQUIBB, RN Outcome: Progressing   Problem: Pain Managment: Goal: General experience of comfort will improve and/or be controlled 11/18/2024 1341 by Connell Lucrecia SQUIBB, RN Outcome: Progressing 11/18/2024 1335 by Connell Lucrecia SQUIBB, RN Outcome: Progressing   Problem: Safety: Goal: Ability to remain free from injury will improve 11/18/2024 1341 by Connell Lucrecia SQUIBB, RN Outcome: Progressing 11/18/2024 1335 by Connell Lucrecia SQUIBB, RN Outcome: Progressing   Problem: Skin Integrity: Goal: Risk for impaired skin integrity will decrease 11/18/2024 1341 by Connell Lucrecia SQUIBB, RN Outcome: Progressing 11/18/2024 1335 by Connell Lucrecia SQUIBB, RN Outcome: Progressing   Problem: Activity: Goal: Ability to tolerate increased activity will improve 11/18/2024 1341 by Connell Lucrecia SQUIBB, RN Outcome: Progressing 11/18/2024 1335 by Connell Lucrecia SQUIBB, RN Outcome: Progressing   Problem: Clinical Measurements: Goal: Ability to maintain a body temperature in the normal range will improve 11/18/2024 1341 by Connell Lucrecia SQUIBB, RN Outcome: Progressing 11/18/2024 1335 by Connell Lucrecia SQUIBB, RN Outcome: Progressing   Problem:  Respiratory: Goal: Ability to maintain adequate ventilation will improve 11/18/2024 1341 by Connell Lucrecia SQUIBB, RN Outcome: Progressing 11/18/2024 1335 by Connell Lucrecia SQUIBB, RN Outcome: Progressing Goal: Ability to maintain a clear airway will improve 11/18/2024 1341 by Connell Lucrecia SQUIBB, RN Outcome: Progressing 11/18/2024 1335 by Connell Lucrecia SQUIBB, RN Outcome: Progressing   Problem: Education: Goal: Ability to describe self-care measures that may prevent or decrease complications (Diabetes Survival Skills Education) will improve 11/18/2024 1341 by Connell Lucrecia SQUIBB, RN Outcome: Progressing 11/18/2024 1335 by Connell Lucrecia SQUIBB, RN Outcome: Progressing Goal: Individualized Educational Video(s) 11/18/2024 1341 by Connell Lucrecia SQUIBB, RN Outcome: Progressing 11/18/2024 1335 by Connell Lucrecia SQUIBB, RN Outcome: Progressing   Problem: Coping: Goal: Ability to adjust to condition or change in health will improve 11/18/2024 1341 by Connell Lucrecia SQUIBB, RN Outcome: Progressing 11/18/2024 1335 by Connell Lucrecia SQUIBB, RN Outcome: Progressing   Problem: Fluid Volume: Goal: Ability to maintain a balanced intake and output will improve 11/18/2024 1341 by Connell Lucrecia SQUIBB, RN Outcome: Progressing 11/18/2024 1335 by Connell Lucrecia SQUIBB, RN Outcome: Progressing   Problem: Health Behavior/Discharge Planning: Goal: Ability to identify and utilize available resources and services will improve 11/18/2024 1341 by Connell Lucrecia SQUIBB, RN Outcome: Progressing 11/18/2024 1335 by Connell Lucrecia SQUIBB, RN Outcome: Progressing Goal: Ability to manage health-related needs will improve 11/18/2024 1341 by Connell Lucrecia SQUIBB, RN Outcome: Progressing 11/18/2024 1335 by Connell Lucrecia SQUIBB, RN Outcome: Progressing   Problem: Metabolic: Goal: Ability to maintain appropriate glucose levels will improve 11/18/2024 1341 by Connell Lucrecia SQUIBB, RN Outcome: Progressing 11/18/2024 1335 by Connell Lucrecia SQUIBB, RN Outcome: Progressing   Problem: Nutritional: Goal: Maintenance  of adequate nutrition will improve 11/18/2024 1341 by Connell Lucrecia SQUIBB, RN Outcome: Progressing 11/18/2024 1335 by Connell Lucrecia SQUIBB, RN Outcome: Progressing Goal: Progress toward achieving an optimal weight will improve 11/18/2024 1341 by Connell Lucrecia SQUIBB, RN Outcome: Progressing 11/18/2024 1335 by Connell Lucrecia SQUIBB, RN Outcome: Progressing   Problem: Skin Integrity: Goal: Risk for impaired skin integrity will decrease 11/18/2024 1341 by Connell Lucrecia SQUIBB, RN Outcome: Progressing 11/18/2024 1335 by Connell Lucrecia SQUIBB, RN Outcome: Progressing   Problem: Tissue Perfusion: Goal: Adequacy of tissue perfusion will improve 11/18/2024 1341 by Connell Lucrecia SQUIBB,  RN Outcome: Progressing 11/18/2024 1335 by Connell Lucrecia SQUIBB, RN Outcome: Progressing   Problem: Education: Goal: Knowledge of General Education information will improve Description: Including pain rating scale, medication(s)/side effects and non-pharmacologic comfort measures 11/18/2024 1341 by Connell Lucrecia SQUIBB, RN Outcome: Progressing 11/18/2024 1335 by Connell Lucrecia SQUIBB, RN Outcome: Progressing   Problem: Health Behavior/Discharge Planning: Goal: Ability to manage health-related needs will improve 11/18/2024 1341 by Connell Lucrecia SQUIBB, RN Outcome: Progressing 11/18/2024 1335 by Connell Lucrecia SQUIBB, RN Outcome: Progressing   Problem: Clinical Measurements: Goal: Ability to maintain clinical measurements within normal limits will improve 11/18/2024 1341 by Connell Lucrecia SQUIBB, RN Outcome: Progressing 11/18/2024 1335 by Connell Lucrecia SQUIBB, RN Outcome: Progressing Goal: Will remain free from infection 11/18/2024 1341 by Connell Lucrecia SQUIBB, RN Outcome: Progressing 11/18/2024 1335 by Connell Lucrecia SQUIBB, RN Outcome: Progressing Goal: Diagnostic test results will improve 11/18/2024 1341 by Connell Lucrecia SQUIBB, RN Outcome: Progressing 11/18/2024 1335 by Connell Lucrecia SQUIBB, RN Outcome: Progressing Goal: Respiratory complications will improve 11/18/2024 1341 by Connell Lucrecia SQUIBB,  RN Outcome: Progressing 11/18/2024 1335 by Connell Lucrecia SQUIBB, RN Outcome: Progressing Goal: Cardiovascular complication will be avoided 11/18/2024 1341 by Connell Lucrecia SQUIBB, RN Outcome: Progressing 11/18/2024 1335 by Connell Lucrecia SQUIBB, RN Outcome: Progressing   Problem: Activity: Goal: Risk for activity intolerance will decrease 11/18/2024 1341 by Connell Lucrecia SQUIBB, RN Outcome: Progressing 11/18/2024 1335 by Connell Lucrecia SQUIBB, RN Outcome: Progressing   Problem: Nutrition: Goal: Adequate nutrition will be maintained 11/18/2024 1341 by Connell Lucrecia SQUIBB, RN Outcome: Progressing 11/18/2024 1335 by Connell Lucrecia SQUIBB, RN Outcome: Progressing   Problem: Coping: Goal: Level of anxiety will decrease 11/18/2024 1341 by Connell Lucrecia SQUIBB, RN Outcome: Progressing 11/18/2024 1335 by Connell Lucrecia SQUIBB, RN Outcome: Not Progressing   Problem: Elimination: Goal: Will not experience complications related to bowel motility 11/18/2024 1341 by Connell Lucrecia SQUIBB, RN Outcome: Progressing 11/18/2024 1335 by Connell Lucrecia SQUIBB, RN Outcome: Progressing Goal: Will not experience complications related to urinary retention 11/18/2024 1341 by Connell Lucrecia SQUIBB, RN Outcome: Progressing 11/18/2024 1335 by Connell Lucrecia SQUIBB, RN Outcome: Progressing   Problem: Pain Managment: Goal: General experience of comfort will improve and/or be controlled 11/18/2024 1341 by Connell Lucrecia SQUIBB, RN Outcome: Progressing 11/18/2024 1335 by Connell Lucrecia SQUIBB, RN Outcome: Progressing   Problem: Safety: Goal: Ability to remain free from injury will improve 11/18/2024 1341 by Connell Lucrecia SQUIBB, RN Outcome: Progressing 11/18/2024 1335 by Connell Lucrecia SQUIBB, RN Outcome: Progressing   Problem: Skin Integrity: Goal: Risk for impaired skin integrity will decrease 11/18/2024 1341 by Connell Lucrecia SQUIBB, RN Outcome: Progressing 11/18/2024 1335 by Connell Lucrecia SQUIBB, RN Outcome: Progressing   Problem: Activity: Goal: Ability to tolerate increased activity will  improve 11/18/2024 1341 by Connell Lucrecia SQUIBB, RN Outcome: Progressing 11/18/2024 1335 by Connell Lucrecia SQUIBB, RN Outcome: Progressing   Problem: Clinical Measurements: Goal: Ability to maintain a body temperature in the normal range will improve 11/18/2024 1341 by Connell Lucrecia SQUIBB, RN Outcome: Progressing 11/18/2024 1335 by Connell Lucrecia SQUIBB, RN Outcome: Progressing   Problem: Respiratory: Goal: Ability to maintain adequate ventilation will improve 11/18/2024 1341 by Connell Lucrecia SQUIBB, RN Outcome: Progressing 11/18/2024 1335 by Connell Lucrecia SQUIBB, RN Outcome: Progressing Goal: Ability to maintain a clear airway will improve 11/18/2024 1341 by Connell Lucrecia SQUIBB, RN Outcome: Progressing 11/18/2024 1335 by Connell Lucrecia SQUIBB, RN Outcome: Progressing   Problem: Education: Goal: Ability to describe self-care measures that may prevent or decrease complications (Diabetes Survival Skills Education) will improve  11/18/2024 1341 by Connell Lucrecia SQUIBB, RN Outcome: Progressing 11/18/2024 1335 by Connell Lucrecia SQUIBB, RN Outcome: Progressing Goal: Individualized Educational Video(s) 11/18/2024 1341 by Connell Lucrecia SQUIBB, RN Outcome: Progressing 11/18/2024 1335 by Connell Lucrecia SQUIBB, RN Outcome: Progressing   Problem: Coping: Goal: Ability to adjust to condition or change in health will improve 11/18/2024 1341 by Connell Lucrecia SQUIBB, RN Outcome: Progressing 11/18/2024 1335 by Connell Lucrecia SQUIBB, RN Outcome: Progressing   Problem: Fluid Volume: Goal: Ability to maintain a balanced intake and output will improve 11/18/2024 1341 by Connell Lucrecia SQUIBB, RN Outcome: Progressing 11/18/2024 1335 by Connell Lucrecia SQUIBB, RN Outcome: Progressing   Problem: Health Behavior/Discharge Planning: Goal: Ability to identify and utilize available resources and services will improve 11/18/2024 1341 by Connell Lucrecia SQUIBB, RN Outcome: Progressing 11/18/2024 1335 by Connell Lucrecia SQUIBB, RN Outcome: Progressing Goal: Ability to manage health-related needs will  improve 11/18/2024 1341 by Connell Lucrecia SQUIBB, RN Outcome: Progressing 11/18/2024 1335 by Connell Lucrecia SQUIBB, RN Outcome: Progressing   Problem: Metabolic: Goal: Ability to maintain appropriate glucose levels will improve 11/18/2024 1341 by Connell Lucrecia SQUIBB, RN Outcome: Progressing 11/18/2024 1335 by Connell Lucrecia SQUIBB, RN Outcome: Progressing   Problem: Nutritional: Goal: Maintenance of adequate nutrition will improve 11/18/2024 1341 by Connell Lucrecia SQUIBB, RN Outcome: Progressing 11/18/2024 1335 by Connell Lucrecia SQUIBB, RN Outcome: Progressing Goal: Progress toward achieving an optimal weight will improve 11/18/2024 1341 by Connell Lucrecia SQUIBB, RN Outcome: Progressing 11/18/2024 1335 by Connell Lucrecia SQUIBB, RN Outcome: Progressing   Problem: Skin Integrity: Goal: Risk for impaired skin integrity will decrease 11/18/2024 1341 by Connell Lucrecia SQUIBB, RN Outcome: Progressing 11/18/2024 1335 by Connell Lucrecia SQUIBB, RN Outcome: Progressing   Problem: Tissue Perfusion: Goal: Adequacy of tissue perfusion will improve 11/18/2024 1341 by Connell Lucrecia SQUIBB, RN Outcome: Progressing 11/18/2024 1335 by Connell Lucrecia SQUIBB, RN Outcome: Progressing

## 2024-11-18 NOTE — Telephone Encounter (Signed)
 CT scan ordered for 4 weeks from this date.  Please go ahead and schedule patient for follow-up with Dr. Geronimo or APP for CT follow-up for empyema in 5 or 6 weeks.

## 2024-11-18 NOTE — Plan of Care (Signed)
 Problem: Education: Goal: Knowledge of General Education information will improve Description: Including pain rating scale, medication(s)/side effects and non-pharmacologic comfort measures Outcome: Progressing   Problem: Health Behavior/Discharge Planning: Goal: Ability to manage health-related needs will improve Outcome: Progressing   Problem: Clinical Measurements: Goal: Ability to maintain clinical measurements within normal limits will improve Outcome: Progressing Goal: Will remain free from infection Outcome: Progressing Goal: Diagnostic test results will improve Outcome: Progressing Goal: Respiratory complications will improve Outcome: Progressing Goal: Cardiovascular complication will be avoided Outcome: Progressing   Problem: Activity: Goal: Risk for activity intolerance will decrease Outcome: Progressing   Problem: Nutrition: Goal: Adequate nutrition will be maintained Outcome: Progressing   Problem: Elimination: Goal: Will not experience complications related to bowel motility Outcome: Progressing Goal: Will not experience complications related to urinary retention Outcome: Progressing   Problem: Pain Managment: Goal: General experience of comfort will improve and/or be controlled Outcome: Progressing   Problem: Safety: Goal: Ability to remain free from injury will improve Outcome: Progressing   Problem: Skin Integrity: Goal: Risk for impaired skin integrity will decrease Outcome: Progressing   Problem: Activity: Goal: Ability to tolerate increased activity will improve Outcome: Progressing   Problem: Clinical Measurements: Goal: Ability to maintain a body temperature in the normal range will improve Outcome: Progressing   Problem: Respiratory: Goal: Ability to maintain adequate ventilation will improve Outcome: Progressing Goal: Ability to maintain a clear airway will improve Outcome: Progressing   Problem: Education: Goal: Ability to  describe self-care measures that may prevent or decrease complications (Diabetes Survival Skills Education) will improve Outcome: Progressing Goal: Individualized Educational Video(s) Outcome: Progressing   Problem: Coping: Goal: Ability to adjust to condition or change in health will improve Outcome: Progressing   Problem: Fluid Volume: Goal: Ability to maintain a balanced intake and output will improve Outcome: Progressing   Problem: Health Behavior/Discharge Planning: Goal: Ability to identify and utilize available resources and services will improve Outcome: Progressing Goal: Ability to manage health-related needs will improve Outcome: Progressing   Problem: Metabolic: Goal: Ability to maintain appropriate glucose levels will improve Outcome: Progressing   Problem: Nutritional: Goal: Maintenance of adequate nutrition will improve Outcome: Progressing Goal: Progress toward achieving an optimal weight will improve Outcome: Progressing   Problem: Skin Integrity: Goal: Risk for impaired skin integrity will decrease Outcome: Progressing   Problem: Tissue Perfusion: Goal: Adequacy of tissue perfusion will improve Outcome: Progressing   Problem: Education: Goal: Knowledge of General Education information will improve Description: Including pain rating scale, medication(s)/side effects and non-pharmacologic comfort measures Outcome: Progressing   Problem: Health Behavior/Discharge Planning: Goal: Ability to manage health-related needs will improve Outcome: Progressing   Problem: Clinical Measurements: Goal: Ability to maintain clinical measurements within normal limits will improve Outcome: Progressing Goal: Will remain free from infection Outcome: Progressing Goal: Diagnostic test results will improve Outcome: Progressing Goal: Respiratory complications will improve Outcome: Progressing Goal: Cardiovascular complication will be avoided Outcome: Progressing    Problem: Activity: Goal: Risk for activity intolerance will decrease Outcome: Progressing   Problem: Nutrition: Goal: Adequate nutrition will be maintained Outcome: Progressing   Problem: Elimination: Goal: Will not experience complications related to bowel motility Outcome: Progressing Goal: Will not experience complications related to urinary retention Outcome: Progressing   Problem: Pain Managment: Goal: General experience of comfort will improve and/or be controlled Outcome: Progressing   Problem: Safety: Goal: Ability to remain free from injury will improve Outcome: Progressing   Problem: Skin Integrity: Goal: Risk for impaired skin integrity will decrease Outcome: Progressing  Problem: Activity: Goal: Ability to tolerate increased activity will improve Outcome: Progressing   Problem: Clinical Measurements: Goal: Ability to maintain a body temperature in the normal range will improve Outcome: Progressing   Problem: Respiratory: Goal: Ability to maintain adequate ventilation will improve Outcome: Progressing Goal: Ability to maintain a clear airway will improve Outcome: Progressing   Problem: Education: Goal: Ability to describe self-care measures that may prevent or decrease complications (Diabetes Survival Skills Education) will improve Outcome: Progressing Goal: Individualized Educational Video(s) Outcome: Progressing   Problem: Coping: Goal: Ability to adjust to condition or change in health will improve Outcome: Progressing   Problem: Fluid Volume: Goal: Ability to maintain a balanced intake and output will improve Outcome: Progressing   Problem: Health Behavior/Discharge Planning: Goal: Ability to identify and utilize available resources and services will improve Outcome: Progressing Goal: Ability to manage health-related needs will improve Outcome: Progressing   Problem: Metabolic: Goal: Ability to maintain appropriate glucose levels will  improve Outcome: Progressing   Problem: Nutritional: Goal: Maintenance of adequate nutrition will improve Outcome: Progressing Goal: Progress toward achieving an optimal weight will improve Outcome: Progressing   Problem: Skin Integrity: Goal: Risk for impaired skin integrity will decrease Outcome: Progressing   Problem: Tissue Perfusion: Goal: Adequacy of tissue perfusion will improve Outcome: Progressing   Problem: Education: Goal: Knowledge of General Education information will improve Description: Including pain rating scale, medication(s)/side effects and non-pharmacologic comfort measures Outcome: Progressing   Problem: Health Behavior/Discharge Planning: Goal: Ability to manage health-related needs will improve Outcome: Progressing   Problem: Clinical Measurements: Goal: Ability to maintain clinical measurements within normal limits will improve Outcome: Progressing Goal: Will remain free from infection Outcome: Progressing Goal: Diagnostic test results will improve Outcome: Progressing Goal: Respiratory complications will improve Outcome: Progressing Goal: Cardiovascular complication will be avoided Outcome: Progressing   Problem: Activity: Goal: Risk for activity intolerance will decrease Outcome: Progressing   Problem: Nutrition: Goal: Adequate nutrition will be maintained Outcome: Progressing   Problem: Elimination: Goal: Will not experience complications related to bowel motility Outcome: Progressing Goal: Will not experience complications related to urinary retention Outcome: Progressing   Problem: Pain Managment: Goal: General experience of comfort will improve and/or be controlled Outcome: Progressing   Problem: Safety: Goal: Ability to remain free from injury will improve Outcome: Progressing   Problem: Skin Integrity: Goal: Risk for impaired skin integrity will decrease Outcome: Progressing   Problem: Activity: Goal: Ability to  tolerate increased activity will improve Outcome: Progressing   Problem: Clinical Measurements: Goal: Ability to maintain a body temperature in the normal range will improve Outcome: Progressing   Problem: Respiratory: Goal: Ability to maintain adequate ventilation will improve Outcome: Progressing Goal: Ability to maintain a clear airway will improve Outcome: Progressing   Problem: Education: Goal: Ability to describe self-care measures that may prevent or decrease complications (Diabetes Survival Skills Education) will improve Outcome: Progressing Goal: Individualized Educational Video(s) Outcome: Progressing   Problem: Coping: Goal: Ability to adjust to condition or change in health will improve Outcome: Progressing   Problem: Fluid Volume: Goal: Ability to maintain a balanced intake and output will improve Outcome: Progressing   Problem: Health Behavior/Discharge Planning: Goal: Ability to identify and utilize available resources and services will improve Outcome: Progressing Goal: Ability to manage health-related needs will improve Outcome: Progressing   Problem: Metabolic: Goal: Ability to maintain appropriate glucose levels will improve Outcome: Progressing   Problem: Nutritional: Goal: Maintenance of adequate nutrition will improve Outcome: Progressing Goal: Progress toward achieving an optimal  weight will improve Outcome: Progressing   Problem: Skin Integrity: Goal: Risk for impaired skin integrity will decrease Outcome: Progressing   Problem: Tissue Perfusion: Goal: Adequacy of tissue perfusion will improve Outcome: Progressing   Problem: Education: Goal: Knowledge of General Education information will improve Description: Including pain rating scale, medication(s)/side effects and non-pharmacologic comfort measures Outcome: Progressing   Problem: Health Behavior/Discharge Planning: Goal: Ability to manage health-related needs will improve Outcome:  Progressing   Problem: Clinical Measurements: Goal: Ability to maintain clinical measurements within normal limits will improve Outcome: Progressing Goal: Will remain free from infection Outcome: Progressing Goal: Diagnostic test results will improve Outcome: Progressing Goal: Respiratory complications will improve Outcome: Progressing Goal: Cardiovascular complication will be avoided Outcome: Progressing   Problem: Activity: Goal: Risk for activity intolerance will decrease Outcome: Progressing   Problem: Nutrition: Goal: Adequate nutrition will be maintained Outcome: Progressing   Problem: Elimination: Goal: Will not experience complications related to bowel motility Outcome: Progressing Goal: Will not experience complications related to urinary retention Outcome: Progressing   Problem: Pain Managment: Goal: General experience of comfort will improve and/or be controlled Outcome: Progressing   Problem: Safety: Goal: Ability to remain free from injury will improve Outcome: Progressing   Problem: Skin Integrity: Goal: Risk for impaired skin integrity will decrease Outcome: Progressing   Problem: Activity: Goal: Ability to tolerate increased activity will improve Outcome: Progressing   Problem: Clinical Measurements: Goal: Ability to maintain a body temperature in the normal range will improve Outcome: Progressing   Problem: Respiratory: Goal: Ability to maintain adequate ventilation will improve Outcome: Progressing Goal: Ability to maintain a clear airway will improve Outcome: Progressing   Problem: Education: Goal: Ability to describe self-care measures that may prevent or decrease complications (Diabetes Survival Skills Education) will improve Outcome: Progressing Goal: Individualized Educational Video(s) Outcome: Progressing   Problem: Coping: Goal: Ability to adjust to condition or change in health will improve Outcome: Progressing   Problem: Fluid  Volume: Goal: Ability to maintain a balanced intake and output will improve Outcome: Progressing   Problem: Health Behavior/Discharge Planning: Goal: Ability to identify and utilize available resources and services will improve Outcome: Progressing Goal: Ability to manage health-related needs will improve Outcome: Progressing   Problem: Metabolic: Goal: Ability to maintain appropriate glucose levels will improve Outcome: Progressing   Problem: Nutritional: Goal: Maintenance of adequate nutrition will improve Outcome: Progressing Goal: Progress toward achieving an optimal weight will improve Outcome: Progressing   Problem: Skin Integrity: Goal: Risk for impaired skin integrity will decrease Outcome: Progressing   Problem: Tissue Perfusion: Goal: Adequacy of tissue perfusion will improve Outcome: Progressing   Problem: Education: Goal: Knowledge of General Education information will improve Description: Including pain rating scale, medication(s)/side effects and non-pharmacologic comfort measures Outcome: Progressing   Problem: Health Behavior/Discharge Planning: Goal: Ability to manage health-related needs will improve Outcome: Progressing   Problem: Clinical Measurements: Goal: Ability to maintain clinical measurements within normal limits will improve Outcome: Progressing Goal: Will remain free from infection Outcome: Progressing Goal: Diagnostic test results will improve Outcome: Progressing Goal: Respiratory complications will improve Outcome: Progressing Goal: Cardiovascular complication will be avoided Outcome: Progressing   Problem: Activity: Goal: Risk for activity intolerance will decrease Outcome: Progressing   Problem: Nutrition: Goal: Adequate nutrition will be maintained Outcome: Progressing   Problem: Elimination: Goal: Will not experience complications related to bowel motility Outcome: Progressing Goal: Will not experience complications  related to urinary retention Outcome: Progressing   Problem: Pain Managment: Goal: General experience of comfort will  improve and/or be controlled Outcome: Progressing   Problem: Safety: Goal: Ability to remain free from injury will improve Outcome: Progressing   Problem: Skin Integrity: Goal: Risk for impaired skin integrity will decrease Outcome: Progressing   Problem: Activity: Goal: Ability to tolerate increased activity will improve Outcome: Progressing   Problem: Clinical Measurements: Goal: Ability to maintain a body temperature in the normal range will improve Outcome: Progressing   Problem: Respiratory: Goal: Ability to maintain adequate ventilation will improve Outcome: Progressing Goal: Ability to maintain a clear airway will improve Outcome: Progressing   Problem: Education: Goal: Ability to describe self-care measures that may prevent or decrease complications (Diabetes Survival Skills Education) will improve Outcome: Progressing Goal: Individualized Educational Video(s) Outcome: Progressing   Problem: Coping: Goal: Ability to adjust to condition or change in health will improve Outcome: Progressing   Problem: Fluid Volume: Goal: Ability to maintain a balanced intake and output will improve Outcome: Progressing   Problem: Health Behavior/Discharge Planning: Goal: Ability to identify and utilize available resources and services will improve Outcome: Progressing Goal: Ability to manage health-related needs will improve Outcome: Progressing   Problem: Metabolic: Goal: Ability to maintain appropriate glucose levels will improve Outcome: Progressing   Problem: Nutritional: Goal: Maintenance of adequate nutrition will improve Outcome: Progressing Goal: Progress toward achieving an optimal weight will improve Outcome: Progressing   Problem: Skin Integrity: Goal: Risk for impaired skin integrity will decrease Outcome: Progressing   Problem: Tissue  Perfusion: Goal: Adequacy of tissue perfusion will improve Outcome: Progressing

## 2024-11-18 NOTE — Progress Notes (Signed)
 PROGRESS NOTE  Jeffery [name] FMW:[id number] DOB: 1941-12-13 DOA: 11/12/2024 PCP: Geofm Glade PARAS, MD   LOS: 6 days   Brief Narrative / Interim history: 83 year old male with history of asthma, bronchiectasis, DM2, heart block status post pacemaker, chronic systolic CHF who comes into the hospital with complaints of cough, dyspnea, malaise ongoing for the last several weeks if not months.  He has been having poor p.o. intake and has lost a lot of weight during this period of time.  He was found to have pneumonia with a pleural effusion, and upon sampling it turned to be purulent and pulmonary was consulted.  Subjective / 24h Interval events: Overall doing well.  No significant shortness of breath.  Feels like he is getting weaker  Assesement and Plan: Principal problem Left upper lobe pneumonia, empyema -underwent thoracentesis 12/2, found to have purulent fluid.  He is now status post IR guided chest tube placement.  Pulmonary following, appreciate input, status post lytics x 2.  - Cultures with GPC's in chains, preliminary cultures show pansensitive Streptococcus.  He will need several weeks of Augmentin  per pulm - Chest tube management per pulmonary, appreciate input and follow-up  Active problems Chronic systolic CHF -2D echo with LVEF 25-30%, global hypokinesis, mild LVH, RV systolic function with moderate reduction and enlargement.  Appears fairly euvolemic - Continue dapagliflozin , bisoprolol , statin.  No chest pain - Slightly worsening lower extremity swelling today, start IV Lasix  and placed on TED hoses  Heart block-status post pacemaker  Oral thrush-continue nystatin , thrush appears clinically improved today and he has less discomfort  Diarrhea-possibly side effect from antibiotics.  No abdominal pain, no nausea or vomiting.  Placed on Florastor  Hyperlipidemia-continue statin  History of a flutter - not on anticoagulation, telemetry showed paced rhythm.  Continue  bisoprolol  and aspirin   History of bronchiectasis/asthma -no wheezing today  Hyponatremia-mild, monitor, no large shifts noted, now normalized  DM2 -continue sliding scale, CBGs reviewed and are acceptable  CBG (last 3)  Recent Labs    11/17/24 1545 11/17/24 2149 11/18/24 0652  GLUCAP 167* 114* 133*   Scheduled Meds:  aspirin  EC  81 mg Oral QHS   bisoprolol   2.5 mg Oral Daily   dapagliflozin  propanediol  10 mg Oral QAC breakfast   feeding supplement  237 mL Oral BID BM   fluticasone  furoate-vilanterol  1 puff Inhalation Daily   furosemide   40 mg Intravenous Daily   guaiFENesin   10 mL Oral TID   insulin  aspart  0-5 Units Subcutaneous QHS   insulin  aspart  0-6 Units Subcutaneous TID WC   magic mouthwash w/lidocaine   10 mL Oral QID   montelukast   10 mg Oral QHS   nystatin   5 mL Mouth/Throat QID   polyethylene glycol  17 g Oral Daily   rosuvastatin   10 mg Oral Q M,W,F   saccharomyces boulardii  250 mg Oral BID   sodium chloride  flush  10 mL Intrapleural Q6H   sodium chloride  flush  3 mL Intravenous Q12H   Continuous Infusions:  ampicillin -sulbactam (UNASYN ) IV 3 g (11/18/24 0649)   PRN Meds:.acetaminophen  **OR** acetaminophen , albuterol , LORazepam , morphine  injection, oxyCODONE , prochlorperazine   Current Outpatient Medications  Medication Instructions   albuterol  (PROVENTIL ) 2.5 mg, Nebulization, Every 6 hours PRN   albuterol  (VENTOLIN  HFA) 108 (90 Base) MCG/ACT inhaler TAKE 2 PUFFS BY MOUTH EVERY 6 HOURS AS NEEDED FOR WHEEZE OR SHORTNESS OF BREATH   aspirin  EC 81 mg, Oral, Daily at bedtime, Swallow whole.   bisoprolol  (ZEBETA ) 2.5  mg, Oral, Daily   dapagliflozin  propanediol (FARXIGA ) 10 mg, Oral, Daily before breakfast   fluticasone -salmeterol (ADVAIR HFA) 230-21 MCG/ACT inhaler 2 puffs, Inhalation, 2 times daily   furosemide  (LASIX ) 20 mg, Oral, Daily   glipiZIDE  (GLUCOTROL ) 5-10 mg, Oral, 2 times daily before meals   HYDROcodone -acetaminophen  (NORCO) 7.5-325 MG tablet  1 tablet, Oral, Every 6 hours PRN   ipratropium (ATROVENT ) 0.02 % nebulizer solution TAKE 2.5 ML (0.5 MG TOTAL) BY NEBULIZATION 4 TIMES A DAY   LORazepam  (ATIVAN ) 1 MG tablet TAKE 1/2 TO 1 TABLET BY MOUTH NIGHTLY AS NEEDED.   metFORMIN  (GLUCOPHAGE ) 500 mg, Oral, 2 times daily with meals   mirtazapine  (REMERON ) 7.5-15 mg, Oral, Daily at bedtime   montelukast  (SINGULAIR ) 10 mg, Oral, Daily at bedtime   nitroGLYCERIN  (NITROSTAT ) 0.4 mg, Sublingual, Every 5 min x3 PRN   potassium chloride  20 MEQ TBCR 10 mEq, Oral, Daily   rosuvastatin  (CRESTOR ) 10 MG tablet TAKE 1 TABLET ON MONDAY, WEDNESDAY & FRIDAY.   spironolactone  (ALDACTONE ) 25 mg, Oral, Daily   traMADol  (ULTRAM ) 50-100 mg, Oral, Every 8 hours PRN   valsartan  (DIOVAN ) 20 mg, Oral, Daily   vitamin E (VITAMIN E) 400 Units, Oral, Daily    Diet Orders (From admission, onward)     Start     Ordered   11/14/24 1305  Diet Heart Room service appropriate? Yes; Fluid consistency: Thin  Diet effective now       Question Answer Comment  Room service appropriate? Yes   Fluid consistency: Thin      11/14/24 1304            DVT prophylaxis: Place TED hose Start: 11/18/24 0900   Lab Results  Component Value Date   PLT 462 (H) 11/18/2024      Code Status: Full Code  Family Communication: No family at bedside  Status is: Inpatient Remains inpatient appropriate because: Severity of illness  Level of care: Telemetry  Consultants:  PCCM IR  Objective: Vitals:   11/18/24 0409 11/18/24 0500 11/18/24 0712 11/18/24 0803  BP:   109/64   Pulse:   66   Resp: 18  18   Temp: 98.4 F (36.9 C)  97.7 F (36.5 C)   TempSrc: Oral  Oral   SpO2: 96%  96% 97%  Weight:  70.1 kg    Height:        Intake/Output Summary (Last 24 hours) at 11/18/2024 1020 Last data filed at 11/18/2024 0649 Gross per 24 hour  Intake 75 ml  Output 250 ml  Net -175 ml   Wt Readings from Last 3 Encounters:  11/18/24 70.1 kg  10/30/24 66.2 kg  10/18/24  68.9 kg    Examination:  Constitutional: NAD Eyes: lids and conjunctivae normal, no scleral icterus ENMT: mmm Neck: normal, supple Respiratory: clear to auscultation bilaterally, no wheezing, no crackles.  Chest tube in place.  Normal respiratory effort.  Cardiovascular: Regular rate and rhythm, no murmurs / rubs / gallops.  1+ pitting LE edema. Abdomen: soft, no distention, no tenderness. Bowel sounds positive.  Skin: no rashes  Data Reviewed: I have independently reviewed following labs and imaging studies   CBC Recent Labs  Lab 11/12/24 0241 11/12/24 0246 11/14/24 0235 11/15/24 0237 11/15/24 1758 11/16/24 0229 11/18/24 0224  WBC 26.1*   < > 16.3* 12.9* 9.2 8.9 10.7*  HGB 12.0*   < > 9.6* 10.4* 10.5* 9.6* 9.7*  HCT 38.9*   < > 29.7* 33.3* 33.8* 30.7* 31.2*  PLT  628*   < > 463* 518* 526* 456* 462*  MCV 81.7   < > 78.2* 80.4 81.3 79.9* 80.4  MCH 25.2*   < > 25.3* 25.1* 25.2* 25.0* 25.0*  MCHC 30.8   < > 32.3 31.2 31.1 31.3 31.1  RDW 17.4*   < > 17.1* 17.3* 17.2* 17.2* 17.4*  LYMPHSABS 1.0  --   --   --  0.5*  --   --   MONOABS 0.8  --   --   --  0.9  --   --   EOSABS 0.0  --   --   --  0.0  --   --   BASOSABS 0.0  --   --   --  0.0  --   --    < > = values in this interval not displayed.    Recent Labs  Lab 11/12/24 0241 11/12/24 0246 11/12/24 0402 11/12/24 0718 11/13/24 0221 11/14/24 0235 11/14/24 0939 11/15/24 0237 11/16/24 0229 11/18/24 0224  NA 127*   < >  --   --  132* 132*  --  136 136 136  K 5.4*   < >  --   --  4.8 3.8  --  4.1 3.7 4.2  CL 91*  --   --   --  94* 96*  --  100 100 100  CO2 19*  --   --   --  28 28  --  28 25 30   GLUCOSE 155*  --   --   --  182* 138*  --  129* 112* 111*  BUN 25*  --   --   --  26* 28*  --  26* 19 12  CREATININE 1.07  --   --   --  1.16 0.93  --  0.93 0.83 0.92  CALCIUM  8.7*  --   --   --  8.5* 8.4*  --  8.3* 8.2* 8.1*  AST  --   --   --   --  39  --   --  66* 39  --   ALT  --   --   --   --  48*  --   --  57* 49*  --    ALKPHOS  --   --   --   --  111  --   --  103 91  --   BILITOT  --   --   --   --  0.5  --   --  0.5 0.4  --   ALBUMIN  --   --   --   --  1.7*  --   --  1.7* 1.6*  --   MG  --   --   --   --  1.9  --   --  2.0 1.9 1.8  LATICACIDVEN  --   --  3.1* 2.1*  --   --   --   --   --   --   INR  --   --   --   --   --   --  1.2  --   --   --   BNP 524.6*  --   --   --   --   --   --   --   --   --    < > = values in this interval not displayed.    ------------------------------------------------------------------------------------------------------------------ No results for input(s): CHOL, HDL, LDLCALC, TRIG,  CHOLHDL, LDLDIRECT in the last 72 hours.  Lab Results  Component Value Date   HGBA1C 5.9 (A) 08/07/2024   HGBA1C 5.9 08/07/2024   HGBA1C 5.9 (A) 08/07/2024   HGBA1C 5.9 08/07/2024   ------------------------------------------------------------------------------------------------------------------ No results for input(s): TSH, T4TOTAL, T3FREE, THYROIDAB in the last 72 hours.  Invalid input(s): FREET3  Cardiac Enzymes No results for input(s): CKMB, TROPONINI, MYOGLOBIN in the last 168 hours.  Invalid input(s): CK ------------------------------------------------------------------------------------------------------------------    Component Value Date/Time   BNP 524.6 (H) 11/12/2024 0241    CBG: Recent Labs  Lab 11/17/24 0727 11/17/24 1144 11/17/24 1545 11/17/24 2149 11/18/24 0652  GLUCAP 120* 153* 167* 114* 133*    Recent Results (from the past 240 hours)  Resp panel by RT-PCR (RSV, Flu A&B, Covid) Anterior Nasal Swab     Status: None   Collection Time: 11/12/24  2:41 AM   Specimen: Anterior Nasal Swab  Result Value Ref Range Status   SARS Coronavirus 2 by RT PCR NEGATIVE NEGATIVE Final   Influenza A by PCR NEGATIVE NEGATIVE Final   Influenza B by PCR NEGATIVE NEGATIVE Final    Comment: (NOTE) The Xpert Xpress SARS-CoV-2/FLU/RSV plus  assay is intended as an aid in the diagnosis of influenza from Nasopharyngeal swab specimens and should not be used as a sole basis for treatment. Nasal washings and aspirates are unacceptable for Xpert Xpress SARS-CoV-2/FLU/RSV testing.  Fact Sheet for Patients: bloggercourse.com  Fact Sheet for Healthcare Providers: seriousbroker.it  This test is not yet approved or cleared by the [place]  FDA and has been authorized for detection and/or diagnosis of SARS-CoV-2 by FDA under an Emergency Use Authorization (EUA). This EUA will remain in effect (meaning this test can be used) for the duration of the COVID-19 declaration under Section 564(b)(1) of the Act, 21 U.S.C. section 360bbb-3(b)(1), unless the authorization is terminated or revoked.     Resp Syncytial Virus by PCR NEGATIVE NEGATIVE Final    Comment: (NOTE) Fact Sheet for Patients: bloggercourse.com  Fact Sheet for Healthcare Providers: seriousbroker.it  This test is not yet approved or cleared by the [place]  FDA and has been authorized for detection and/or diagnosis of SARS-CoV-2 by FDA under an Emergency Use Authorization (EUA). This EUA will remain in effect (meaning this test can be used) for the duration of the COVID-19 declaration under Section 564(b)(1) of the Act, 21 U.S.C. section 360bbb-3(b)(1), unless the authorization is terminated or revoked.  Performed at [facility], [address], [place], [place] [address]   Blood culture (routine x 2)     Status: None   Collection Time: 11/12/24  3:35 AM   Specimen: BLOOD RIGHT HAND  Result Value Ref Range Status   Specimen Description BLOOD RIGHT HAND  Final   Special Requests   Final    BOTTLES DRAWN AEROBIC AND ANAEROBIC Blood Culture adequate volume   Culture   Final    NO GROWTH 5 DAYS Performed at [facility], [address]  [place], [place] [address]    Report Status 11/17/2024 FINAL  Final  Blood culture (routine x 2)     Status: None   Collection Time: 11/12/24  4:11 AM   Specimen: BLOOD RIGHT ARM  Result Value Ref Range Status   Specimen Description BLOOD RIGHT ARM  Final   Special Requests   Final    BOTTLES DRAWN AEROBIC AND ANAEROBIC Blood Culture adequate volume   Culture   Final    NO GROWTH 5  DAYS Performed at [facility], [address] [place], [place] [address]    Report Status 11/17/2024 FINAL  Final  Body fluid culture w Gram Stain     Status: None   Collection Time: 11/13/24 10:16 AM   Specimen: Lung, Left; Pleural Fluid  Result Value Ref Range Status   Specimen Description PLEURAL  Final   Special Requests LUNG,LEFT  Final   Gram Stain   Final    ABUNDANT WBC PRESENT, PREDOMINANTLY PMN ABUNDANT GRAM POSITIVE COCCI IN CHAINS Performed at [facility], [address] [place], [place] [address]    Culture ABUNDANT STREPTOCOCCUS INTERMEDIUS  Final   Report Status 11/16/2024 FINAL  Final   Organism ID, Bacteria STREPTOCOCCUS INTERMEDIUS  Final      Susceptibility   Streptococcus intermedius - MIC*    PENICILLIN <=0.06 SENSITIVE Sensitive     CEFTRIAXONE  <=0.12 SENSITIVE Sensitive     ERYTHROMYCIN <=0.12 SENSITIVE Sensitive     LEVOFLOXACIN  0.5 SENSITIVE Sensitive     VANCOMYCIN 0.5 SENSITIVE Sensitive     * ABUNDANT STREPTOCOCCUS INTERMEDIUS  Aerobic/Anaerobic Culture w Gram Stain (surgical/deep wound)     Status: None (Preliminary result)   Collection Time: 11/14/24 12:50 PM   Specimen: Path Tissue  Result Value Ref Range Status   Specimen Description TISSUE  Final   Special Requests PLEURAL LEFT  Final   Gram Stain   Final    ABUNDANT WBC PRESENT, PREDOMINANTLY PMN MODERATE GRAM POSITIVE COCCI IN CHAINS    Culture   Final    ABUNDANT STREPTOCOCCUS INTERMEDIUS CULTURE REINCUBATED FOR BETTER GROWTH Performed at [facility], [address] [place], [place]  [address]    Report Status PENDING  Incomplete     Radiology Studies: DG CHEST PORT 1 VIEW Result Date: 11/18/2024 EXAM: 1 VIEW(S) XRAY OF THE CHEST 11/18/2024 07:46:00 AM COMPARISON: 11/17/2024 CLINICAL HISTORY: Chest tube in place. FINDINGS: LINES, TUBES AND DEVICES: Left basilar chest tube is stable in place. Left chest wall pacemaker is noted. LUNGS AND PLEURA: Worsening hazy and patchy left mid to lower lung opacities are present. Small left pleural effusion is present. Small left basilar pneumothorax is present. HEART AND MEDIASTINUM: No acute abnormality of the cardiac and mediastinal silhouettes. BONES AND SOFT TISSUES: No acute osseous abnormality. IMPRESSION: 1. Worsening hazy and patchy left mid to lower lung opacities. 2. Small left pleural effusion.  and small left basilar pneumothorax are stable. Electronically signed by: Waddell Calk MD 11/18/2024 08:11 AM EST RP Workstation: [id number]    Nilda Fendt, MD, PhD [facility]  Between 7 am - 7 pm I am available, please contact me via Amion (for emergencies) or Securechat (non urgent messages)  Between 7 pm - 7 am I am not available, please contact night coverage MD/APP via Amion

## 2024-11-18 NOTE — Progress Notes (Signed)
 NAME:  Jeffery [name], MRN:  [id number], DOB:  Feb 17, 1941, LOS: 6 ADMISSION DATE:  11/12/2024, CONSULTATION DATE:  12/02 REFERRING MD:  Dr Trixie, CHIEF COMPLAINT:  shortness of breath    History of Present Illness:  Patient is a 83 yo M w/ pertinent PMH bronchiectasis, asthma, T2DM, type 2 2nd degree heart block s/p pacemaker placement, systolic chf, GERD presents to [facility] on 12/1 w/ pleural effusion.   Worsening dyspnea and cough over the past month. On 12/1 increased wob. EMS called found hypoxic and placed on supplemental o2 and given nebs. Taken to [facility] ED. On arrival sats 93% on 2 l/m [place]. Noted to have rales on left side. CXR showing LUL and LLL infiltrate. CT chest showing diffuse patchy infiltrates LUL and LLL; large left loculated pleural effusion concerning for empyema or parapneumonic effusion. Afebrile and wbc 26. Cultures obtained and started on broad spectrum abx. Swallow eval performed showing severe aspiration. Abx changed to unasyn  for possible aspiration pna. On 12/2 IR performed thora with only 150 green tinged purulent fluids. Pleural LDH >2,500. TNC count 1,970,000. PCCM consulted.    Pertinent  Medical History   Past Medical History:  Diagnosis Date   Arthritis    thumbs (07/25/2018)   Asthma    CHF (congestive heart failure) (HCC)    Chronic bronchitis (HCC)    Environmental allergies    GERD (gastroesophageal reflux disease)    Headache    seasonal; w/environmental allergies (07/25/2018)   History of blood transfusion    when I had laminectomy (07/25/2018)   HTN (hypertension)    Hyperlipidemia    LBBB (left bundle branch block) 1999   Metatarsal bone fracture 03/07/2018   Myocardial infarction [facility])    was told I've had an old MI; probably in the 1990s (07/25/2018)   Pneumonia    as a child, age 29; viral pneumonia 3 times in the last 10 years (07/25/2018)   Presence of permanent cardiac pacemaker 07/25/2018   Seasonal allergies    Sleep apnea     wife says I do (07/25/2018)   Type II diabetes mellitus (HCC)      Significant Hospital Events: Including procedures, antibiotic start and stop dates in addition to other pertinent events   12/2- left thoracentesis per IR with 150 mL of green tinge purulent fluid removed 12/3- left sided chest tube placement per IR 12/4 first dose of pleural lytics, 350 mL out chest tube 12/5 second dose of pleural lytics, 80 mL out chest tube  12/6 230 mL reported out of chest tube in last 24 hours, third dose of lytics given  12/7 out from chest tube   Interim History / Subjective:  Seen sitting up on edge of bed eating breakfast, frustrated over lack of independence   Objective    Blood pressure (!) 93/59, pulse 63, temperature 98.4 F (36.9 C), temperature source Oral, resp. rate 18, height 5' 9 (1.753 m), weight 70.1 kg, SpO2 96%.    FiO2 (%):  [21 %] 21 %   Intake/Output Summary (Last 24 hours) at 11/18/2024 0727 Last data filed at 11/18/2024 9350 Gross per 24 hour  Intake 75 ml  Output 250 ml  Net -175 ml   Filed Weights   11/16/24 0337 11/17/24 0300 11/18/24 0500  Weight: 67.6 kg 68.6 kg 70.1 kg    Examination: General: Acute on chronically ill appearing elderly male sitting up on edge of bed in NAD  HEENT: [place]/AT, MM pink/moist, PERRL,  Neuro:  Alert and oriented x3, non-focal  CV: s1s2 regular rate and rhythm, no murmur, rubs, or gallops,  PULM:  Clear to auscultation, diminished left last, chest tube in place  GI: soft, bowel sounds active in all 4 quadrants, non-tender, non-distended, tolerating oral diet Extremities: warm/dry, no edema  Skin: no rashes or lesions  Assessment and Plan   Left sided PNA, concern for aspiration  Left sided empyema strep intermedius s/p thoracentesis with lytics and chest tube placement  Hx of bronchiectasis and asthma -Pleural LDH >2,500. TNC count 1,970,000, purulent,.  Pleural culture with abundant pansensitive Streptococcus  intermedius P: Minimal output since third dose of lytics Will discuss benefit of CT chest prior to chest tube removal  Routine chest tube care  Monitor chest tube output  Flush chest tube per protocol  Continue Unasyn , transition to PO Augmentin  at D/C to complete extended course of 4-6 weeks  Aspiration precautions  Mobilize   Critical care time:   Coye Dawood D. Harris, NP-C [facility] Personal contact information can be found on Amion  If no contact or response made please call [phone] 11/18/2024, 7:27 AM

## 2024-11-19 ENCOUNTER — Inpatient Hospital Stay (HOSPITAL_COMMUNITY)

## 2024-11-19 ENCOUNTER — Other Ambulatory Visit (HOSPITAL_COMMUNITY): Payer: Self-pay

## 2024-11-19 ENCOUNTER — Ambulatory Visit (HOSPITAL_COMMUNITY): Admitting: Internal Medicine

## 2024-11-19 DIAGNOSIS — J153 Pneumonia due to streptococcus, group B: Secondary | ICD-10-CM

## 2024-11-19 LAB — BASIC METABOLIC PANEL WITH GFR
Anion gap: 10 (ref 5–15)
BUN: 11 mg/dL (ref 8–23)
CO2: 29 mmol/L (ref 22–32)
Calcium: 8 mg/dL — ABNORMAL LOW (ref 8.9–10.3)
Chloride: 99 mmol/L (ref 98–111)
Creatinine, Ser: 0.74 mg/dL (ref 0.61–1.24)
GFR, Estimated: >60 mL/min (ref >60)
Glucose, Bld: 101 mg/dL — ABNORMAL HIGH (ref 70–99)
Potassium: 3.5 mmol/L (ref 3.5–5.1)
Sodium: 138 mmol/L (ref 135–145)

## 2024-11-19 LAB — AEROBIC/ANAEROBIC CULTURE W GRAM STAIN (SURGICAL/DEEP WOUND): Culture: NO GROWTH

## 2024-11-19 LAB — GLUCOSE, CAPILLARY
Glucose-Capillary: 111 mg/dL — ABNORMAL HIGH (ref 70–99)
Glucose-Capillary: 103 mg/dL — ABNORMAL HIGH (ref 70–99)

## 2024-11-19 LAB — MAGNESIUM: Magnesium: 1.8 mg/dL (ref 1.7–2.4)

## 2024-11-19 MED ORDER — POTASSIUM CHLORIDE CRYS ER 20 MEQ PO TBCR
40.0000 meq | EXTENDED_RELEASE_TABLET | Freq: Once | ORAL | Status: AC
  Administered 2024-11-19: 40 meq via ORAL
  Filled 2024-11-19: qty 2

## 2024-11-19 MED ORDER — ACIDOPHILUS 100 MG PO CAPS
100.0000 mg | ORAL_CAPSULE | Freq: Three times a day (TID) | ORAL | 0 refills | Status: AC
  Filled 2024-11-19: qty 126, 42d supply, fill #0

## 2024-11-19 MED ORDER — AMOXICILLIN-POT CLAVULANATE 400-57 MG/5ML PO SUSR
875.0000 mg | Freq: Two times a day (BID) | ORAL | 0 refills | Status: DC
  Filled 2024-11-19: qty 200, 9d supply, fill #0

## 2024-11-19 MED ORDER — NYSTATIN 100000 UNIT/ML MT SUSP
5.0000 mL | Freq: Four times a day (QID) | OROMUCOSAL | 0 refills | Status: AC
  Filled 2024-11-19: qty 60, 3d supply, fill #0

## 2024-11-19 MED ORDER — OXYCODONE HCL 5 MG PO TABS
5.0000 mg | ORAL_TABLET | Freq: Four times a day (QID) | ORAL | 0 refills | Status: DC | PRN
  Filled 2024-11-19: qty 24, 6d supply, fill #0

## 2024-11-19 MED ORDER — GUAIFENESIN 100 MG/5ML PO LIQD
10.0000 mL | Freq: Three times a day (TID) | ORAL | 0 refills | Status: DC
  Filled 2024-11-19: qty 236, 8d supply, fill #0

## 2024-11-19 NOTE — Progress Notes (Signed)
 Physical Therapy Treatment Patient Details Name: Jeffery [name] MRN: [id number] DOB: [birth year] Today's Date: 11/19/2024   History of Present Illness 83 yo M adm 11/12/24 with cough, dyspnea, malaise. LLL PNA with purulent pleural effusion. Thoracentesis 12/1.  12/3 Lt pleural pigtail. PMH: asthma, bronchiectasis, DM2, heart block s/p PPM, CHF    PT Comments  Pt up in chair on arrival, pleasant and eager for mobility with pt demonstrating steady progress towards acute goals. Pt demonstrating transfers sit<>stand with CGA for safety without AD support. Pt ambulating in room with IV pole support and for hallway distance with RW for support with grossly supervision for safety with no LOB noted. Pt ascending/descend 8 steps with bil rail support with CGA for safety. Pt with continued cool digits making pulse ox probe reading challenging but tolerated gait and stairs on RA this date (see below for readings) with no noted DOE. Pt continues to benefit from skilled PT services to progress toward functional mobility goals.     If plan is discharge home, recommend the following: A little help with walking and/or transfers;A little help with bathing/dressing/bathroom;Assistance with cooking/housework;Assist for transportation;Help with stairs or ramp for entrance   Can travel by private vehicle        Equipment Recommendations  Rolling walker (2 wheels)    Recommendations for Other Services       Precautions / Restrictions Precautions Precautions: Fall Recall of Precautions/Restrictions: Intact Restrictions Weight Bearing Restrictions Per Provider Order: No     Mobility  Bed Mobility Overal bed mobility: Needs Assistance             General bed mobility comments: in chair on arrival and end of session    Transfers Overall transfer level: Needs assistance Equipment used: None Transfers: Sit to/from Stand Sit to Stand: Contact guard assist           General transfer comment:  steady rise, CGA for safety    Ambulation/Gait Ambulation/Gait assistance: Contact guard assist, Supervision Gait Distance (Feet): 200 Feet (+15' x2) Assistive device: Rolling walker (2 wheels), IV Pole Gait Pattern/deviations: Step-through pattern, Decreased stride length       General Gait Details: slow steady gait with 1 standing rests with SPO2 94% on RA when able to get pleth. Pt with cold fingers and difficulty getting reading with no SOB with gait. ambulating to and from bathroom with IV pole support. no LOB throughout   Stairs Stairs: Yes Stairs assistance: Contact guard assist Stair Management: Alternating pattern, Forwards, Two rails Number of Stairs: 8 General stair comments: slow ascent with alternating pattern, use of bil rails   Wheelchair Mobility     Tilt Bed    Modified Rankin (Stroke Patients Only)       Balance Overall balance assessment: Needs assistance Sitting-balance support: No upper extremity supported, Feet supported Sitting balance-Leahy Scale: Good     Standing balance support: Bilateral upper extremity supported, During functional activity, Reliant on assistive device for balance Standing balance-Leahy Scale: Poor Standing balance comment: benefits from BUE support                            Communication Communication Communication: No apparent difficulties Factors Affecting Communication: Hearing impaired (pt reports Rt ear HOH, appropriate during session)  Cognition Arousal: Alert Behavior During Therapy: WFL for tasks assessed/performed   PT - Cognitive impairments: No apparent impairments  Following commands: Intact      Cueing Cueing Techniques: Verbal cues  Exercises      General Comments General comments (skin integrity, edema, etc.): 66-88bpm with activity, SpO2 98% on RA, 94% on RA with activity when good pleth      Pertinent Vitals/Pain Pain Assessment Pain Assessment:  No/denies pain Pain Intervention(s): Monitored during session    Home Living                          Prior Function            PT Goals (current goals can now be found in the care plan section) Acute Rehab PT Goals Patient Stated Goal: to get stronger PT Goal Formulation: With patient Time For Goal Achievement: 11/28/24 Progress towards PT goals: Progressing toward goals    Frequency    Min 2X/week      PT Plan      Co-evaluation              AM-PAC PT 6 Clicks Mobility   Outcome Measure  Help needed turning from your back to your side while in a flat bed without using bedrails?: A Little Help needed moving from lying on your back to sitting on the side of a flat bed without using bedrails?: A Little Help needed moving to and from a bed to a chair (including a wheelchair)?: A Little Help needed standing up from a chair using your arms (e.g., wheelchair or bedside chair)?: A Little Help needed to walk in hospital room?: A Little Help needed climbing 3-5 steps with a railing? : A Little 6 Click Score: 18    End of Session   Activity Tolerance: Patient limited by fatigue Patient left: in chair;with call bell/phone within reach Nurse Communication: Mobility status PT Visit Diagnosis: Muscle weakness (generalized) (M62.81);Other abnormalities of gait and mobility (R26.89)     Time: 9145-9068 PT Time Calculation (min) (ACUTE ONLY): 37 min  Charges:    $Gait Training: 8-22 mins $Therapeutic Activity: 8-22 mins PT General Charges $$ ACUTE PT VISIT: 1 Visit                     Jnyah Brazee R. PTA Acute Rehabilitation Services Office: [phone]   Therisa CHRISTELLA Boor 11/19/2024, 9:39 AM

## 2024-11-19 NOTE — TOC Transition Note (Signed)
 Transition of Care [facility]) - Discharge Note   Patient Details  Name: Jeffery [name] MRN: [id number] Date of Birth: October 21, 1941  [facility] CM/SW Contact:  Waddell Barnie Rama, RN Phone Number: 11/19/2024, 10:33 AM   Clinical Narrative:    For dc today, NCM offered choice, for [facility] services he chose [facility], Referral sent thru portal .  Accepted.  Soc will begin 24 to 48 hrs post dc.  He states he does not have a preference for the agency for DME.  NCM sent referral to [employer] for rolling walker, this will be delivered to his room. He has transportation at [employer].         Patient Goals and CMS Choice            Discharge Placement                       Discharge Plan and Services Additional resources added to the After Visit Summary for                                       Social Drivers of Health (SDOH) Interventions SDOH Screenings   Food Insecurity: No Food Insecurity (11/12/2024)  Housing: Low Risk  (11/12/2024)  Transportation Needs: No Transportation Needs (11/12/2024)  Utilities: Not At Risk (11/12/2024)  Alcohol Screen: Low Risk  (06/07/2023)  Depression (PHQ2-9): Low Risk  (05/04/2024)  Financial Resource Strain: Low Risk  (06/07/2023)  Physical Activity: Sufficiently Active (06/07/2023)  Social Connections: Socially Integrated (11/12/2024)  Stress: No Stress Concern Present (06/07/2023)  Tobacco Use: Medium Risk (11/14/2024)     Readmission Risk Interventions    11/12/2024    5:17 PM  Readmission Risk Prevention Plan  Transportation Screening Complete  Home Care Screening Complete  Medication Review (RN CM) Complete

## 2024-11-19 NOTE — Care Management Important Message (Signed)
 Important Message  Patient Details  Name: ODESSA MORREN MRN: [id number] Date of Birth: [birth year]   Important Message Given:  Yes - Medicare IM     [name] 11/19/2024, 10:26 AM

## 2024-11-19 NOTE — Discharge Summary (Signed)
 Physician Discharge Summary  Jeffery [name] FMW:[id number] DOB: [birth year] DOA: 11/12/2024  PCP: Geofm Glade PARAS, MD  Admit date: 11/12/2024 Discharge date: 11/19/2024  Admitted From: home Disposition:  home  Recommendations for Outpatient Follow-up:  Follow up with PCP in 1-2 weeks Please obtain BMP/CBC in one week Follow up with Pulmonary in ~4 weeks Outpatient CT scan of the chest in 4 weeeks  Home Health: PT, RN Equipment/Devices: walker  Discharge Condition: stable CODE STATUS: Full code Diet Orders (From admission, onward)     Start     Ordered   11/14/24 1305  Diet Heart Room service appropriate? Yes; Fluid consistency: Thin  Diet effective now       Question Answer Comment  Room service appropriate? Yes   Fluid consistency: Thin      11/14/24 1304           Brief Narrative / Interim history: 83 year old male with history of asthma, bronchiectasis, DM2, heart block status post pacemaker, chronic systolic CHF who comes into the hospital with complaints of cough, dyspnea, malaise ongoing for the last several weeks if not months.  He has been having poor p.o. intake and has lost a lot of weight during this period of time.  He was found to have pneumonia with a pleural effusion, and upon sampling it turned to be purulent and pulmonary was consulted.  Hospital Course / Discharge diagnoses: Principal problem Left upper lobe pneumonia, empyema -underwent thoracentesis 12/2, found to have purulent fluid.  Interventional radiology as well as pulmonary consulted and followed patient while hospitalized.  He underwent an IR guided chest tube placement, and received pleural lytics for few days in a row.  With decreased chest tube output, and repeat imaging, this was eventually removed on 12/7.  Follow-up chest x-ray showed stability, case was discussed with pulmonary on the day of discharge.  Fluid cultures showed pansensitive Streptococcus.  He was maintained on Unasyn  while  hospitalized, and will be discharged home on Augmentin  for several weeks of antibiotics.  He will have follow-up with pulmonary clinic in about 4 weeks and will have a repeat CT scan.  I advised patient to check in with his PCP as well in the next week after discharge.   Active problems Chronic systolic CHF -2D echo with LVEF 25-30%, global hypokinesis, mild LVH, RV systolic function with moderate reduction and enlargement.  Appears fairly euvolemic with a degree of chronic lower extremity swelling.  Continue home medications upon discharge except for valsartan , his blood pressure has been soft while here off of his valsartan , and he is concerned about dropping further if he goes home and resumes at Heart block-status post pacemaker Oral thrush-continue nystatin , thrush appears clinically improved today and he has less discomfort.  Almost resolved on discharge, continue for 3 more days  Diarrhea-possibly side effect from antibiotics.  No abdominal pain, no nausea or vomiting.  Placed on Florastor, continue upon discharge  Hyperlipidemia-continue statin History of a flutter - not on anticoagulation, telemetry showed paced rhythm.  Continue bisoprolol  and aspirin  History of bronchiectasis/asthma -no wheezing, respiratory status is stable, resume home inhalers  Hyponatremia-mild, monitor, no large shifts noted, now normalized DM2 -continue home regimen  Sepsis ruled out   Discharge Instructions   Allergies as of 11/19/2024       Reactions   Contrast Media [iodinated Contrast Media] Other (See Comments)   Redness and warm sensation, this reaction was noted on 02/27/13 during a Cardiac MRI per pt.  Pt sts  he had erythema on his head, chest, and shoulders.  Pt had a pacemaker placed August 2019 and was pre-medicated for the dye and had no allergic reaction at that time. -Carissa Mozingo B.S. RT(R)(CT)        Medication List     STOP taking these medications    HYDROcodone -acetaminophen   7.5-325 MG tablet Commonly known as: NORCO   traMADol  50 MG tablet Commonly known as: ULTRAM    valsartan  40 MG tablet Commonly known as: Diovan        TAKE these medications    albuterol  108 (90 Base) MCG/ACT inhaler Commonly known as: VENTOLIN  HFA TAKE 2 PUFFS BY MOUTH EVERY 6 HOURS AS NEEDED FOR WHEEZE OR SHORTNESS OF BREATH   albuterol  (2.5 MG/3ML) 0.083% nebulizer solution Commonly known as: PROVENTIL  Take 3 mLs (2.5 mg total) by nebulization every 6 (six) hours as needed for wheezing or shortness of breath.   amoxicillin -clavulanate 400-57 MG/5ML suspension Commonly known as: AUGMENTIN  Take 10.9 mLs (875 mg total) by mouth 2 (two) times daily.   aspirin  EC 81 MG tablet Take 1 tablet (81 mg total) by mouth at bedtime. Swallow whole.   bisoprolol  5 MG tablet Commonly known as: ZEBETA  Take 0.5 tablets (2.5 mg total) by mouth daily.   dapagliflozin  propanediol 10 MG Tabs tablet Commonly known as: Farxiga  Take 1 tablet (10 mg total) by mouth daily before breakfast.   fluticasone -salmeterol 230-21 MCG/ACT inhaler Commonly known as: ADVAIR HFA Inhale 2 puffs into the lungs 2 (two) times daily.   furosemide  40 MG tablet Commonly known as: LASIX  Take 0.5 tablets (20 mg total) by mouth daily.   glipiZIDE  5 MG tablet Commonly known as: GLUCOTROL  TAKE 1-2 TABLETS (5-10 MG TOTAL) BY MOUTH 2 (TWO) TIMES DAILY BEFORE A MEAL.   guaiFENesin  100 MG/5ML liquid Commonly known as: ROBITUSSIN Take 10 mLs by mouth 3 (three) times daily.   ipratropium 0.02 % nebulizer solution Commonly known as: ATROVENT  TAKE 2.5 ML (0.5 MG TOTAL) BY NEBULIZATION 4 TIMES A DAY What changed: See the new instructions.   LORazepam  1 MG tablet Commonly known as: ATIVAN  TAKE 1/2 TO 1 TABLET BY MOUTH NIGHTLY AS NEEDED.   metFORMIN  500 MG tablet Commonly known as: GLUCOPHAGE  TAKE 1 TABLET BY MOUTH TWICE A DAY WITH FOOD   mirtazapine  7.5 MG tablet Commonly known as: REMERON  Take 1-2 tablets  (7.5-15 mg total) by mouth at bedtime. What changed: how much to take   montelukast  10 MG tablet Commonly known as: SINGULAIR  Take 1 tablet (10 mg total) by mouth at bedtime.   nitroGLYCERIN  0.4 MG SL tablet Commonly known as: NITROSTAT  Place 1 tablet (0.4 mg total) under the tongue every 5 (five) minutes x 3 doses as needed for chest pain.   nystatin  100000 UNIT/ML suspension Commonly known as: MYCOSTATIN  Use as directed 5 mLs (500,000 Units total) in the mouth or throat 4 (four) times daily for 3 days.   oxyCODONE  5 MG immediate release tablet Commonly known as: Oxy IR/ROXICODONE  Take 1 tablet (5 mg total) by mouth every 6 (six) hours as needed for moderate pain (pain score 4-6).   Potassium Chloride  ER 20 MEQ Tbcr Take 0.5 tablets (10 mEq total) by mouth daily.   rosuvastatin  10 MG tablet Commonly known as: CRESTOR  TAKE 1 TABLET ON MONDAY, WEDNESDAY & FRIDAY.   saccharomyces boulardii 250 MG capsule Commonly known as: FLORASTOR Take 1 capsule (250 mg total) by mouth 2 (two) times daily.   spironolactone  25 MG tablet Commonly known as:  ALDACTONE  Take 1 tablet (25 mg total) by mouth daily.   vitamin E 180 MG (400 UNITS) capsule Take 400 Units by mouth daily.               Durable Medical Equipment  (From admission, onward)           Start     Ordered   11/19/24 0930  For home use only DME Walker rolling  Once       Question Answer Comment  Walker: With 5 Inch Wheels   Patient needs a walker to treat with the following condition CHF exacerbation (HCC)      11/19/24 0929            Follow-up Information     Geofm Glade PARAS, MD Follow up in 1 week(s).   Specialty: Internal Medicine Contact information: [address] [place] [address] [phone]         Geronimo Amel, MD Follow up in 4 day(s).   Specialty: Pulmonary Disease Contact information: [address] [place] [address] [place] [address] [phone]                  Consultations: Pulmonary  IR  Procedures/Studies:  DG CHEST PORT 1 VIEW Result Date: 11/19/2024 CLINICAL DATA:  Shortness of breath and pleural effusion. EXAM: PORTABLE CHEST 1 VIEW COMPARISON:  11/18/2024 FINDINGS: Cardiopericardial silhouette is at upper limits of normal for size. Slight interval progression of left base airspace disease with similar fusion. Left chest tube has been removed in the interval. Nodular density noted right lung base, better characterized on CT imaging yesterday. Left-sided permanent pacemaker again noted. Telemetry leads overlie the chest. IMPRESSION: Interval removal of left chest tube with slight interval progression of left base airspace disease and effusion. Electronically Signed   By: Camellia Candle M.D.   On: 11/19/2024 06:29   CT CHEST WO CONTRAST Result Date: 11/18/2024 EXAM: CT CHEST WITHOUT CONTRAST 11/18/2024 12:42:07 PM TECHNIQUE: CT of the chest was performed without the administration of intravenous contrast. Multiplanar reformatted images are provided for review. Automated exposure control, iterative reconstruction, and/or weight based adjustment of the mA/kV was utilized to reduce the radiation dose to as low as reasonably achievable. COMPARISON: Prior exam. CLINICAL HISTORY: Pleural effusion and lung consolidation. FINDINGS: MEDIASTINUM: Pacer noted. Upper normal heart size. Thoracic aortic atheromatous vascular calcification. The central airways are clear. LYMPH NODES: Index AP window lymph node 1.2 cm and short axis on image 28 series 3, formerly the same. Index left lower paratracheal node 1.2 cm and short axis on image 31 series 3, formerly 1.0 cm. No axillary lymphadenopathy. LUNGS AND PLEURA: New small right pleural effusion with passive atelectasis. Hazy ground glass opacities posteriorly in the right upper lobe and in the right lower lobe are new compared to the prior exam. Consolidation in the posterior inferior portion of the left upper lobe is  increased from prior. Consolidation and volume loss in the left lower lobe similar to prior. Pleural thickening on the left with a small (5 percent or less) pneumothorax adjacent to the pleural drainage catheter at the left lung base. Given the lack of an air-fluid level in the sub-centimeter, adjacent density is ascribed as pleural thickening rather than substantial residual left pleural fluid. Some of the more cephalad in the left upper lobe has resolved with some faint ground glass densities in the left upper lobe which seemingly have some dependent component and secondary pulmonary lobular thickening potentially from mild edema.  SOFT TISSUES/BONES: Degenerative glenohumeral arthropathy bilaterally. Thoracic spondylosis with multilevel bridging spurring. UPPER ABDOMEN: Limited images of the upper abdomen demonstrates no acute abnormality. IMPRESSION: 1. New small right pleural effusion with passive atelectasis. 2. Small pneumothorax adjacent to the pleural drainage catheter at the left lung base. Left pleural effusion essentially resolved. Pleural thickening adjacent to the pleural drain. 3. Increased consolidation in the posterior inferior portion of the left upper lobe compared to prior. 4. Consolidation and volume loss in the left lower lobe similar to prior. 5. Hazy ground glass opacities in the right upper and right lower lobes are new compared to prior, with faint ground glass densities in the left upper lobe suggestive of mild edema. 6. Stable mediastinal lymphadenopathy with 1.2 cm AP window and 1.2 cm left lower paratracheal lymph nodes. Most likely reactive given the clinical context. 7. Thoracic aortic atheromatous vascular calcification and upper normal heart size. Electronically signed by: Ryan Salvage MD 11/18/2024 05:10 PM EST RP Workstation: [id number]   DG CHEST PORT 1 VIEW Result Date: 11/18/2024 EXAM: 1 VIEW(S) XRAY OF THE CHEST 11/18/2024 04:41:00 PM COMPARISON: Same day, 11/18/2024.  CLINICAL HISTORY: Empyema (HCC). FINDINGS: LINES, TUBES AND DEVICES: Stable left basilar pleural drainage catheter. LUNGS AND PLEURA: Stable left basilar atelectasis or infiltrate is noted. Small loculated left pleural effusion. No pneumothorax. HEART AND MEDIASTINUM: Left-sided pacemaker is unchanged. BONES AND SOFT TISSUES: No acute osseous abnormality. IMPRESSION: 1. Stable left basilar atelectasis or infiltrate and a small loculated left pleural effusion. 2. Stable position of the left basilar pleural drainage catheter. Electronically signed by: Lynwood Seip MD 11/18/2024 04:57 PM EST RP Workstation: [id number]   DG CHEST PORT 1 VIEW Result Date: 11/18/2024 EXAM: 1 VIEW(S) XRAY OF THE CHEST 11/18/2024 07:46:00 AM COMPARISON: 11/17/2024 CLINICAL HISTORY: Chest tube in place. FINDINGS: LINES, TUBES AND DEVICES: Left basilar chest tube is stable in place. Left chest wall pacemaker is noted. LUNGS AND PLEURA: Worsening hazy and patchy left mid to lower lung opacities are present. Small left pleural effusion is present. Small left basilar pneumothorax is present. HEART AND MEDIASTINUM: No acute abnormality of the cardiac and mediastinal silhouettes. BONES AND SOFT TISSUES: No acute osseous abnormality. IMPRESSION: 1. Worsening hazy and patchy left mid to lower lung opacities. 2. Small left pleural effusion.  and small left basilar pneumothorax are stable. Electronically signed by: Waddell Calk MD 11/18/2024 08:11 AM EST RP Workstation: [id number]   DG CHEST PORT 1 VIEW Result Date: 11/17/2024 CLINICAL DATA:  Empyema. EXAM: PORTABLE CHEST 1 VIEW COMPARISON:  11/16/2024 FINDINGS: Pigtail left chest tube is stable in position in the left lower chest. Lucency near the left hemidiaphragm probably represents a small amount of left pleural air. Persistent opacities in the lateral lower left hemithorax. Biventricular cardiac pacemaker appears stable. No acute abnormality in the right lung. Heart size is stable.  IMPRESSION: 1. Stable position of the left chest tube. Lucency near the left hemidiaphragm probably represents a small basilar pneumothorax. 2. Persistent opacities in the lateral lower left hemithorax. Electronically Signed   By: Juliene Balder M.D.   On: 11/17/2024 12:11   DG CHEST PORT 1 VIEW Result Date: 11/16/2024 CLINICAL DATA:  Chest tube in-situ EXAM: PORTABLE CHEST 1 VIEW COMPARISON:  Chest radiograph dated 11/15/2024 FINDINGS: Lines/tubes: Left chest wall pacemaker leads project over the right atrium and ventricle and tributary of the coronary sinus. Left basilar pleural catheter in-situ. The catheter is now vertically oriented, previously transversely oriented. Lungs: Well inflated lungs. Decreased left basilar patchy opacities.  Unchanged dense left retrocardiac opacity. Pleura: Decreased small left pleural effusion. No definite pneumothorax. Heart/mediastinum: Left heart border is obscured. Bones: No acute osseous abnormality. IMPRESSION: 1. Left basilar pleural catheter in-situ. The catheter is now vertically oriented, previously transversely oriented. No definite pneumothorax. 2. Decreased small left pleural effusion. 3. Decreased left basilar patchy opacities. Unchanged dense left retrocardiac opacity, likely atelectasis. Electronically Signed   By: Limin  Xu M.D.   On: 11/16/2024 13:24   DG CHEST PORT 1 VIEW Result Date: 11/15/2024 CLINICAL DATA:  Left chest tube placement. EXAM: PORTABLE CHEST 1 VIEW COMPARISON:  11/13/2024 FINDINGS: Interval left basilar pleural pigtail catheter. No significant change in extensive airspace opacity and consolidation in the left mid and lower lung zones. Clear right lung. Stable borderline enlarged cardiac silhouette. Stable left subclavian bipolar pacemaker leads. No pneumothorax. Thoracic spine degenerative changes. IMPRESSION: Interval left basilar pleural pigtail catheter with no significant change in extensive left mid and lower lung zone pneumonia.  Electronically Signed   By: Elspeth Bathe M.D.   On: 11/15/2024 11:33   CT GUIDED SOFT TISSUE FLUID DRAIN BY PERC CATH Result Date: 11/14/2024 INDICATION: Empyema. Planned placement of a left-sided pleural catheter for drainage and therapy. EXAM: CT-guided chest tube placement TECHNIQUE: Multidetector CT imaging of the chest was performed following the standard protocol without IV contrast. RADIATION DOSE REDUCTION: This exam was performed according to the departmental dose-optimization program which includes automated exposure control, adjustment of the mA and/or kV according to patient size and/or use of iterative reconstruction technique. MEDICATIONS: The patient is currently admitted to the hospital and receiving intravenous antibiotics. The antibiotics were administered within an appropriate time frame prior to the initiation of the procedure. ANESTHESIA/SEDATION: Moderate (conscious) sedation was employed during this procedure. A total of Versed  0.5 mg and Fentanyl  25 mcg was administered intravenously by the radiology nurse. Total intra-service moderate Sedation Time: 11 minutes. The patient's level of consciousness and vital signs were monitored continuously by radiology nursing throughout the procedure under my direct supervision. COMPLICATIONS: None immediate. PROCEDURE: Informed written consent was obtained from the patient after a thorough discussion of the procedural risks, benefits and alternatives. All questions were addressed. Maximal Sterile Barrier Technique was utilized including caps, mask, sterile gowns, sterile gloves, sterile drape, hand hygiene and skin antiseptic. A timeout was performed prior to the initiation of the procedure. In a supine position radiopaque markers were placed on the patient's lower left lateral chest. Initial axial images of the chest were obtained. The patient's skin was then marked, prepped, and draped in usual sterile fashion. Local anesthesia was achieved 1%  lidocaine . A small incision was made in the left axillary region. A 7 cm Yueh needle was then advanced with intermittent axial images to verify position and redirect the needle as necessary. When the needle was within the pleural cavity, the needle was removed leaving the Yueh catheter behind. Short Amplatz wire was then advanced through the sheath and coiled within the fluid collection. Access was dilated with a 12 French dilator. A 12 French pigtail catheter was then advanced over the guidewire and coiled in position within the fluid collection. The stiffener and wire were retrieved. The locking mechanism engaged. Retention suture was applied. The catheter was then connected to the atrium evacuation device. A small sample was obtained and sent to pathology for culture and sensitivity. IMPRESSION: Satisfactory placement of a 12 French pigtail catheter. Catheter connected to atrium to assist in evacuation of the material. Electronically Signed   By: Cordella  Babcock   On: 11/14/2024 12:56   IR THORACENTESIS ASP PLEURAL SPACE W/IMG GUIDE Result Date: 11/13/2024 INDICATION: Patient admitted with pneumonia and found to have a complex left pleural effusion. Interventional Radiology asked to perform a diagnostic and therapeutic thoracentesis. EXAM: ULTRASOUND GUIDED THORACENTESIS MEDICATIONS: 1% lidocaine  10 mL COMPLICATIONS: None immediate. PROCEDURE: An ultrasound guided thoracentesis was thoroughly discussed with the patient and questions answered. The benefits, risks, alternatives and complications were also discussed. The patient understands and wishes to proceed with the procedure. Written consent was obtained. Ultrasound was performed to localize and mark an adequate pocket of fluid in the left chest. The area was then prepped and draped in the normal sterile fashion. 1% Lidocaine  was used for local anesthesia. Under ultrasound guidance a 6 Fr Safe-T-Centesis catheter was introduced. Thoracentesis was  performed. The catheter was removed and a dressing applied. FINDINGS: A total of approximately 150 mL of green-tinged, purulent fluid fluid was removed. Samples were sent to the laboratory as requested by the clinical team. IMPRESSION: Successful ultrasound guided left thoracentesis yielding 150 mL of pleural fluid. Procedure performed by: Warren Dais, NP Electronically Signed   By: Juliene Balder M.D.   On: 11/13/2024 12:58   DG Chest 1 View Result Date: 11/13/2024 CLINICAL DATA:  Status post left thoracentesis. EXAM: CHEST  1 VIEW COMPARISON:  11/12/2024 FINDINGS: Normal sized heart. Increased patchy and consolidative density in the left lung. Decreased left pleural fluid. No pneumothorax. Clear right lung. Stable left subclavian pacemaker leads. Thoracic spine degenerative changes. Mild-to-moderate left shoulder degenerative changes with superior migration of the humeral head compatible with a large, chronic rotator cuff tear. IMPRESSION: 1. No pneumothorax following left thoracentesis. 2. Increased left lung pneumonia. 3. Decreased left pleural fluid. Electronically Signed   By: Elspeth Bathe M.D.   On: 11/13/2024 10:49   CT CHEST W CONTRAST Result Date: 11/13/2024 EXAM: CT CHEST WITH CONTRAST 11/12/2024 07:52:59 PM TECHNIQUE: CT of the chest was performed with the administration of 75mL iohexol  (OMNIPAQUE ) 350 MG/ML injection. Multiplanar reformatted images are provided for review. Automated exposure control, iterative reconstruction, and/or weight based adjustment of the mA/kV was utilized to reduce the radiation dose to as low as reasonably achievable. COMPARISON: 11/12/24 at 3:37 am CLINICAL HISTORY: Pneumonia, complication suspected, xray done. FINDINGS: MEDIASTINUM: Aortic atherosclerotic calcifications and coronary artery calcifications are present. A left chest wall pacer is noted with leads in the right atrial appendage, right ventricle, and coronary sinus. The central airways are patent.  Pericardium is unremarkable. LYMPH NODES: Prominent left hilar and mediastinal lymph nodes are noted, measuring up to 1.3 cm (image 67/3). No axillary lymphadenopathy. LUNGS AND PLEURA: There is a loculated pleural effusion overlying the left lung base, which measures 10.4 x 6.3 x 11.3 cm and has a volume of 370 cc. The average Hounsfield units equal 35, consistent with underlying complex fluid (axial image 111/3 and sagittal image 146/7). There is dense consolidation involving the left lower lobe and posterior and lateral left upper lobe with additional patchy areas of airspace densities extending into the left apex. Mild subsegmental atelectasis versus scar noted within the right lung base. No pulmonary edema. No pneumothorax. SOFT TISSUES/BONES: No acute abnormality of the bones or soft tissues. UPPER ABDOMEN: Limited images of the upper abdomen demonstrates a tiny hiatal hernia. IMPRESSION: 1. Dense consolidation in the left lower lobe and posterior/lateral left upper lobe with additional patchy airspace disease extending to the left apex, most consistent with pneumonia. 2. Large loculated left basal pleural effusion (  approximately 370 cc) with complex fluid, which may represent parapneumonic effusion or empyema. 3. Prominent left hilar and mediastinal lymph nodes measuring up to 1.3 cm, likely reactive in the setting of pneumonia. Electronically signed by: Waddell Calk MD 11/13/2024 05:13 AM EST RP Workstation: [id number]   DG Swallowing Func-Speech Pathology Result Date: 11/12/2024 Table formatting from the original result was not included. Modified Barium Swallow Study Patient Details Name: MENA LIENAU MRN: [id number] Date of Birth: 01-18-1941 Today's Date: 11/12/2024 HPI/PMH: HPI: 83 yo male presenting to ED 12/1 with worsening shortness of breath, cough, and malaise. CT Chest shows diffuse pneumonia throughout the L lung, debris in the L mainstem bronchus and occluding the LLL bronchus and multiple  lingual bronchi, and punctate hyperdensities in the LLL may be due to aspiration. CT 01/26/2019 recommends f/u with SLP for an MBS due to tree in bud opacities and new patchy lower lobe foci of consolidation. CT Cervical Spine 2021 shows advanced multilevel degenerative disc disease and facet joint arthropathy with at least moderate canal stenosis most severely involving C5-6 and C6-7 and prominent anterior endplate osteophytes. PMH: asthma, bronchiectasis, nonischemic cardiomyopathy with EF 25-30%, T2DM, high grade AV block with PPM, recently identified A-fib, GERD Clinical Impression: Pt presents with severe pharyngeal dysphagia secondary to significantly impaired efficiency. Cervical osteophytes (confirmed by CT 2021) affect epiglottic inversion, leading to nasopharyngeal escape, minimal pharyngeal clearance, and reduced laryngeal closure during the swallow. These factors contribute to sensed aspiration of thin liquids during and after the swallow (PAS 7) and frank penetration of nectar thick liquids (PAS 5). Larger volumes and thicker consistencies do improve epiglottic inversion but increase the amount of pharyngeal residue. While honey thick liquids, purees, and solids did not yield airway invasion, the majority of each bolus remained throughout the pharynx and did not clear with multiple swallows, increasing the potential for post-prandial penetration/aspiration. A chin tuck posture and bilateral head turns did not consistently improve epiglottic inversion. Suspect this represents chronic dysphagia given reports of significant weight loss and prior pneumonia. Findings may also represent increased likelihood of inadequate nutrition/hydration given minimal pharyngeal clearance. For now, recommend he remain NPO except ice chips and meds crushed in puree. Visited with pt and his family after the MBS to discuss results and provide education. Encourage ongoing discussion with MD and PMT to determine goals related to  eating/drinking. Will continue following.  Factors that may increase risk of adverse event in presence of aspiration Noe & Lianne 2021): Factors that may increase risk of adverse event in presence of aspiration Noe & Lianne 2021): Poor general health and/or compromised immunity; Frail or deconditioned; Reduced saliva Recommendations/Plan: Swallowing Evaluation Recommendations Swallowing Evaluation Recommendations Recommendations: NPO except meds; Ice chips PRN after oral care Medication Administration: Crushed with puree Supervision: Patient able to self-feed; Full supervision/cueing for swallowing strategies Swallowing strategies  : Minimize environmental distractions; Slow rate; Small bites/sips; Multiple dry swallows after each bite/sip Postural changes: Position pt fully upright for meals; Stay upright 30-60 min after meals Oral care recommendations: Oral care BID (2x/day); Oral care before ice chips/water  Recommended consults: Consider Palliative care Treatment Plan Treatment Plan Treatment recommendations: Therapy as outlined in treatment plan below Follow-up recommendations: Outpatient SLP Functional status assessment: Patient has had a recent decline in their functional status and demonstrates the ability to make significant improvements in function in a reasonable and predictable amount of time. Treatment frequency: Min 2x/week Treatment duration: 2 weeks Interventions: Aspiration precaution training; Patient/family education; Trials of upgraded texture/liquids; Diet toleration management  by SLP Recommendations Recommendations for follow up therapy are one component of a multi-disciplinary discharge planning process, led by the attending physician.  Recommendations may be updated based on patient status, additional functional criteria and insurance authorization. Assessment: Orofacial Exam: Orofacial Exam Oral Cavity: Oral Hygiene: Xerostomia Oral Cavity - Dentition: Adequate natural dentition  Orofacial Anatomy: WFL Oral Motor/Sensory Function: WFL Anatomy: Anatomy: Suspected cervical osteophytes; Prominent cricopharyngeus Boluses Administered: Boluses Administered Boluses Administered: Thin liquids (Level 0); Mildly thick liquids (Level 2, nectar thick); Moderately thick liquids (Level 3, honey thick); Puree; Solid  Oral Impairment Domain: Oral Impairment Domain Lip Closure: No labial escape Tongue control during bolus hold: Cohesive bolus between tongue to palatal seal Bolus preparation/mastication: Timely and efficient chewing and mashing Bolus transport/lingual motion: Brisk tongue motion Oral residue: Complete oral clearance Location of oral residue : N/A Initiation of pharyngeal swallow : Pyriform sinuses  Pharyngeal Impairment Domain: Pharyngeal Impairment Domain Soft palate elevation: Escape to nasopharynx Laryngeal elevation: Complete superior movement of thyroid  cartilage with complete approximation of arytenoids to epiglottic petiole Anterior hyoid excursion: Partial anterior movement Epiglottic movement: No inversion Laryngeal vestibule closure: Incomplete, narrow column air/contrast in laryngeal vestibule Pharyngeal stripping wave : Present - complete Pharyngeal contraction (A/P view only): N/A Pharyngoesophageal segment opening: Partial distention/partial duration, partial obstruction of flow Tongue base retraction: Wide column of contrast or air between tongue base and PPW Pharyngeal residue: Majority of contrast within or on pharyngeal structures Location of pharyngeal residue: Tongue base; Valleculae; Pharyngeal wall; Pyriform sinuses; Diffuse (>3 areas)  Esophageal Impairment Domain: No data recorded Pill: No data recorded Penetration/Aspiration Scale Score: Penetration/Aspiration Scale Score 1.  Material does not enter airway: Moderately thick liquids (Level 3, honey thick); Puree; Solid 5.  Material enters airway, CONTACTS cords and not ejected out: Mildly thick liquids (Level 2,  nectar thick) 7.  Material enters airway, passes BELOW cords and not ejected out despite cough attempt by patient: Thin liquids (Level 0) Compensatory Strategies: Compensatory Strategies Compensatory strategies: Yes Chin tuck: Ineffective Ineffective Chin Tuck: Thin liquid (Level 0) Left head turn: Ineffective Ineffective Left Head Turn: Mildly thick liquid (Level 2, nectar thick); Moderately thick liquid (Level 3, honey thick) Right head turn: Ineffective Ineffective Right Head Turn: Mildly thick liquid (Level 2, nectar thick); Moderately thick liquid (Level 3, honey thick)   General Information: Caregiver present: No  Diet Prior to this Study: NPO   Temperature : Normal   Respiratory Status: WFL   Supplemental O2: Nasal cannula   History of Recent Intubation: No  Behavior/Cognition: Alert; Cooperative Self-Feeding Abilities: Able to self-feed Baseline vocal quality/speech: Normal Volitional Cough: Able to elicit Volitional Swallow: Able to elicit Exam Limitations: No limitations Goal Planning: Prognosis for improved oropharyngeal function: Fair Barriers to Reach Goals: Time post onset; Severity of deficits No data recorded Patient/Family Stated Goal: none stated Consulted and agree with results and recommendations: Patient; Physician Pain: Pain Assessment Pain Assessment: No/denies pain End of Session: Start Time:SLP Start Time (ACUTE ONLY): 1332 Stop Time: SLP Stop Time (ACUTE ONLY): 1355 Time Calculation:SLP Time Calculation (min) (ACUTE ONLY): 23 min Charges: SLP Evaluations $ SLP Speech Visit: 1 Visit SLP Evaluations $BSS Swallow: 1 Procedure $MBS Swallow: 1 Procedure SLP visit diagnosis: SLP Visit Diagnosis: Dysphagia, pharyngeal phase (R13.13) Past Medical History: Past Medical History: Diagnosis Date  Arthritis   thumbs (07/25/2018)  Asthma   CHF (congestive heart failure) (HCC)   Chronic bronchitis (HCC)   Environmental allergies   GERD (gastroesophageal reflux disease)   Headache  seasonal;  w/environmental allergies (07/25/2018)  History of blood transfusion   when I had laminectomy (07/25/2018)  HTN (hypertension)   Hyperlipidemia   LBBB (left bundle branch block) 1999  Metatarsal bone fracture 03/07/2018  Myocardial infarction [facility])   was told I've had an old MI; probably in the 1990s (07/25/2018)  Pneumonia   as a child, age 89; viral pneumonia 3 times in the last 10 years (07/25/2018)  Presence of permanent cardiac pacemaker 07/25/2018  Seasonal allergies   Sleep apnea   wife says I do (07/25/2018)  Type II diabetes mellitus (HCC)  Past Surgical History: Past Surgical History: Procedure Laterality Date  BACK SURGERY    BIV PACEMAKER INSERTION CRT-P  07/25/2018  BIV PACEMAKER INSERTION CRT-P N/A 07/25/2018  Procedure: BIV PACEMAKER INSERTION CRT-P;  Surgeon: Waddell Danelle ORN, MD;  Location: [facility] INVASIVE CV LAB;  Service: Cardiovascular;  Laterality: N/A;  CARDIAC CATHETERIZATION  2003  Dr Esmeralda Sharps; 85 % R circumflex obstruction  CARPAL TUNNEL RELEASE Left 10/11/2014  Procedure: LEFT CARPAL TUNNEL RELEASE;  Surgeon: Elsie Mussel, MD;  Location: [facility];  Service: Orthopedics;  Laterality: Left;  COLONOSCOPY W/ BIOPSIES AND POLYPECTOMY  2018  INGUINAL HERNIA REPAIR Right 1948  INGUINAL HERNIA REPAIR Left 1988  LUMBAR LAMINECTOMY  1984  MINOR CARPAL TUNNEL Right 11/22/2014  Procedure: RIGHT LIMITED OPEN CARPAL TUNNEL RELEASE;  Surgeon: Elsie Mussel, MD;  Location: [facility];  Service: Orthopedics;  Laterality: Right;  RIGHT/LEFT HEART CATH AND CORONARY ANGIOGRAPHY N/A 10/08/2024  Procedure: RIGHT/LEFT HEART CATH AND CORONARY ANGIOGRAPHY;  Surgeon: Rolan Ezra RAMAN, MD;  Location: [facility] INVASIVE CV LAB;  Service: Cardiovascular;  Laterality: N/A;  TONSILLECTOMY  1958 Damien Blumenthal, M.A., CCC-SLP Speech Language Pathology, Acute Rehabilitation Services Secure Chat preferred [phone] 11/12/2024, 4:14 PM  CT Chest Wo Contrast Result Date: 11/12/2024 EXAM: CT CHEST  WITHOUT CONTRAST 11/12/2024 03:42:41 AM TECHNIQUE: CT of the chest was performed without the administration of intravenous contrast. Multiplanar reformatted images are provided for review. Automated exposure control, iterative reconstruction, and/or weight based adjustment of the mA/kV was utilized to reduce the radiation dose to as low as reasonably achievable. COMPARISON: Comparison with same day x-ray and CT 12/05/2023. CLINICAL HISTORY: Pneumonia, complication suspected, xray done. FINDINGS: MEDIASTINUM: Heart and pericardium are unremarkable. Debris in the left main stem bronchus and occluding the left lower lobe bronchus and multiple lingular bronchi and left lower lobe. Left chest wall pacemaker. LYMPH NODES: No mediastinal, hilar or axillary lymphadenopathy. LUNGS AND PLEURA: Large heterogeneous left pleural effusion with some hyperdense fluid which may represent exudate or clot products. Question thickening of the visceral and parietal pleura however this is not well evaluated without IV contrast . Consolidation and ground glass opacities in the left upper and lower lobes. Bronchial wall thickening throughout the left lung. Scattered centrilobular cluster nodularity and tree-in-bud opacities in the left upper lobe and left lower lobe. Indeterminate punctate hyperdensities in the left lower lobe possibly due to aspiration of hyperdense material. The right lung is clear. No pneumothorax. SOFT TISSUES/BONES: No acute abnormality of the bones or soft tissues. UPPER ABDOMEN: Limited images of the upper abdomen demonstrates no acute abnormality. IMPRESSION: 1. Diffuse pneumonia throughout the left lung. 2. Large heterogeneous left pleural effusion with areas of hyperdense fluid, which may reflect exudate or clot. Possible wall thickening about the pleural fluid which were suggest empyema though this is not well evaluated without IV contrast. 3. Debris in the left mainstem bronchus and occluding the left  lower  lobe bronchus and multiple lingular bronchi. 4. Punctate hyperdensities in the left lower lobe may be due to aspiration. 5. Follow up after treatment is recommended to ensure resolution of these findings. Electronically signed by: Norman Gatlin MD 11/12/2024 03:56 AM EST RP Workstation: [id number]   DG Chest Port 1 View Result Date: 11/12/2024 EXAM: 1 VIEW(S) XRAY OF THE CHEST 11/12/2024 03:14:20 AM COMPARISON: 05/04/2024 CLINICAL HISTORY: resp distress FINDINGS: LUNGS AND PLEURA: Interval development of multifocal airspace opacities in left lung consistent with multifocal pneumonia. Layering left pleural effusion. No pneumothorax. HEART AND MEDIASTINUM: Dual lead left chest pacemaker noted. No acute abnormality of the cardiac and mediastinal silhouettes. BONES AND SOFT TISSUES: Thoracic degenerative changes. No acute osseous abnormality. IMPRESSION: 1. Pneumonia in the left lung with small right pleural effusion. Electronically signed by: Norman Gatlin MD 11/12/2024 03:17 AM EST RP Workstation: [id number]   CUP PACEART REMOTE DEVICE CHECK Result Date: 10/28/2024 PPM Scheduled remote reviewed. Normal device function.  Presenting rhythm: AS-BIV paced, PAC. Next remote transmission per protocol. - CS, CVRS    Subjective: - no chest pain, shortness of breath, no abdominal pain, nausea or vomiting.   Discharge Exam: BP 109/66 (BP Location: Right Arm)   Pulse 66   Temp 98.2 F (36.8 C) (Oral)   Resp 18   Ht 5' 9 (1.753 m)   Wt 68.8 kg   SpO2 94%   BMI 22.40 kg/m   General: Pt is alert, awake, not in acute distress Cardiovascular: RRR, S1/S2 +, no rubs, no gallops Respiratory: CTA bilaterally, no wheezing, no rhonchi Abdominal: Soft, NT, ND, bowel sounds + Extremities: no edema, no cyanosis    The results of significant diagnostics from this hospitalization (including imaging, microbiology, ancillary and laboratory) are listed below for reference.     Microbiology: Recent Results  (from the past 240 hours)  Resp panel by RT-PCR (RSV, Flu A&B, Covid) Anterior Nasal Swab     Status: None   Collection Time: 11/12/24  2:41 AM   Specimen: Anterior Nasal Swab  Result Value Ref Range Status   SARS Coronavirus 2 by RT PCR NEGATIVE NEGATIVE Final   Influenza A by PCR NEGATIVE NEGATIVE Final   Influenza B by PCR NEGATIVE NEGATIVE Final    Comment: (NOTE) The Xpert Xpress SARS-CoV-2/FLU/RSV plus assay is intended as an aid in the diagnosis of influenza from Nasopharyngeal swab specimens and should not be used as a sole basis for treatment. Nasal washings and aspirates are unacceptable for Xpert Xpress SARS-CoV-2/FLU/RSV testing.  Fact Sheet for Patients: bloggercourse.com  Fact Sheet for Healthcare Providers: seriousbroker.it  This test is not yet approved or cleared by the [place]  FDA and has been authorized for detection and/or diagnosis of SARS-CoV-2 by FDA under an Emergency Use Authorization (EUA). This EUA will remain in effect (meaning this test can be used) for the duration of the COVID-19 declaration under Section 564(b)(1) of the Act, 21 U.S.C. section 360bbb-3(b)(1), unless the authorization is terminated or revoked.     Resp Syncytial Virus by PCR NEGATIVE NEGATIVE Final    Comment: (NOTE) Fact Sheet for Patients: bloggercourse.com  Fact Sheet for Healthcare Providers: seriousbroker.it  This test is not yet approved or cleared by the [place]  FDA and has been authorized for detection and/or diagnosis of SARS-CoV-2 by FDA under an Emergency Use Authorization (EUA). This EUA will remain in effect (meaning this test can be used) for the duration of the COVID-19 declaration under Section 564(b)(1) of the Act, 21  U.S.C. section 360bbb-3(b)(1), unless the authorization is terminated or revoked.  Performed at [facility], [address], [place], [place] [address]   Blood culture (routine x 2)     Status: None   Collection Time: 11/12/24  3:35 AM   Specimen: BLOOD RIGHT HAND  Result Value Ref Range Status   Specimen Description BLOOD RIGHT HAND  Final   Special Requests   Final    BOTTLES DRAWN AEROBIC AND ANAEROBIC Blood Culture adequate volume   Culture   Final    NO GROWTH 5 DAYS Performed at [facility], [address], [place], [place] [address]    Report Status 11/17/2024 FINAL  Final  Blood culture (routine x 2)     Status: None   Collection Time: 11/12/24  4:11 AM   Specimen: BLOOD RIGHT ARM  Result Value Ref Range Status   Specimen Description BLOOD RIGHT ARM  Final   Special Requests   Final    BOTTLES DRAWN AEROBIC AND ANAEROBIC Blood Culture adequate volume   Culture   Final    NO GROWTH 5 DAYS Performed at [facility], [address], [place], [place] [address]    Report Status 11/17/2024 FINAL  Final  Body fluid culture w Gram Stain     Status: None   Collection Time: 11/13/24 10:16 AM   Specimen: Lung, Left; Pleural Fluid  Result Value Ref Range Status   Specimen Description PLEURAL  Final   Special Requests LUNG,LEFT  Final   Gram Stain   Final    ABUNDANT WBC PRESENT, PREDOMINANTLY PMN ABUNDANT GRAM POSITIVE COCCI IN CHAINS Performed at [facility], [address], [place], [place] [address]    Culture ABUNDANT STREPTOCOCCUS INTERMEDIUS  Final   Report Status 11/16/2024 FINAL  Final   Organism ID, Bacteria STREPTOCOCCUS INTERMEDIUS  Final      Susceptibility   Streptococcus intermedius - MIC*    PENICILLIN <=0.06 SENSITIVE Sensitive     CEFTRIAXONE  <=0.12 SENSITIVE Sensitive     ERYTHROMYCIN <=0.12 SENSITIVE Sensitive     LEVOFLOXACIN  0.5 SENSITIVE Sensitive     VANCOMYCIN 0.5 SENSITIVE Sensitive     * ABUNDANT STREPTOCOCCUS INTERMEDIUS  Aerobic/Anaerobic Culture w Gram Stain (surgical/deep wound)     Status: None (Preliminary result)   Collection Time: 11/14/24  12:50 PM   Specimen: Path Tissue  Result Value Ref Range Status   Specimen Description TISSUE  Final   Special Requests PLEURAL LEFT  Final   Gram Stain   Final    ABUNDANT WBC PRESENT, PREDOMINANTLY PMN MODERATE GRAM POSITIVE COCCI IN CHAINS    Culture   Final    ABUNDANT STREPTOCOCCUS INTERMEDIUS CONFIRMATION OF SUSCEPTIBILITIES IN PROGRESS Performed at [facility], [address] [place], [place] [address]    Report Status PENDING  Incomplete     Labs: Basic Metabolic Panel: Recent Labs  Lab 11/13/24 0221 11/14/24 0235 11/15/24 0237 11/16/24 0229 11/18/24 0224 11/19/24 0252  NA 132* 132* 136 136 136 138  K 4.8 3.8 4.1 3.7 4.2 3.5  CL 94* 96* 100 100 100 99  CO2 28 28 28 25 30 29   GLUCOSE 182* 138* 129* 112* 111* 101*  BUN 26* 28* 26* 19 12 11   CREATININE 1.16 0.93 0.93 0.83 0.92 0.74  CALCIUM  8.5* 8.4* 8.3* 8.2* 8.1* 8.0*  MG 1.9  --  2.0 1.9 1.8 1.8  PHOS  --   --  2.8  --   --   --  Liver Function Tests: Recent Labs  Lab 11/13/24 0221 11/15/24 0237 11/16/24 0229  AST 39 66* 39  ALT 48* 57* 49*  ALKPHOS 111 103 91  BILITOT 0.5 0.5 0.4  PROT 5.9* 5.3* 5.4*  ALBUMIN 1.7* 1.7* 1.6*   CBC: Recent Labs  Lab 11/14/24 0235 11/15/24 0237 11/15/24 1758 11/16/24 0229 11/18/24 0224  WBC 16.3* 12.9* 9.2 8.9 10.7*  NEUTROABS  --   --  7.7  --   --   HGB 9.6* 10.4* 10.5* 9.6* 9.7*  HCT 29.7* 33.3* 33.8* 30.7* 31.2*  MCV 78.2* 80.4 81.3 79.9* 80.4  PLT 463* 518* 526* 456* 462*   CBG: Recent Labs  Lab 11/18/24 0652 11/18/24 1106 11/18/24 1632 11/18/24 2211 11/19/24 0554  GLUCAP 133* 165* 172* 152* 111*   Hgb A1c No results for input(s): HGBA1C in the last 72 hours. Lipid Profile No results for input(s): CHOL, HDL, LDLCALC, TRIG, CHOLHDL, LDLDIRECT in the last 72 hours. Thyroid  function studies No results for input(s): TSH, T4TOTAL, T3FREE, THYROIDAB in the last 72 hours.  Invalid input(s): FREET3 Urinalysis     Component Value Date/Time   COLORURINE yellow 09/06/2008 1148   APPEARANCEUR Clear 09/06/2008 1148   LABSPEC <1.005 09/06/2008 1148   PHURINE 6.0 09/06/2008 1148   HGBUR negative 09/06/2008 1148   BILIRUBINUR negative 09/06/2008 1148   UROBILINOGEN 0.2 09/06/2008 1148   NITRITE negative 09/06/2008 1148    FURTHER DISCHARGE INSTRUCTIONS:   Get Medicines reviewed and adjusted: Please take all your medications with you for your next visit with your Primary MD   Laboratory/radiological data: Please request your Primary MD to go over all hospital tests and procedure/radiological results at the follow up, please ask your Primary MD to get all Hospital records sent to his/her office.   In some cases, they will be blood work, cultures and biopsy results pending at the time of your discharge. Please request that your primary care M.D. goes through all the records of your hospital data and follows up on these results.   Also Note the following: If you experience worsening of your admission symptoms, develop shortness of breath, life threatening emergency, suicidal or homicidal thoughts you must seek medical attention immediately by calling 911 or calling your MD immediately  if symptoms less severe.   You must read complete instructions/literature along with all the possible adverse reactions/side effects for all the Medicines you take and that have been prescribed to you. Take any new Medicines after you have completely understood and accpet all the possible adverse reactions/side effects.    Do not drive when taking Pain medications or sleeping medications (Benzodaizepines)   Do not take more than prescribed Pain, Sleep and Anxiety Medications. It is not advisable to combine anxiety,sleep and pain medications without talking with your primary care practitioner   Special Instructions: If you have smoked or chewed Tobacco  in the last 2 yrs please stop smoking, stop any regular Alcohol  and or  any Recreational drug use.   Wear Seat belts while driving.   Please note: You were cared for by a hospitalist during your hospital stay. Once you are discharged, your primary care physician will handle any further medical issues. Please note that NO REFILLS for any discharge medications will be authorized once you are discharged, as it is imperative that you return to your primary care physician (or establish a relationship with a primary care physician if you do not have one) for your post hospital discharge needs so that  they can reassess your need for medications and monitor your lab values.  Time coordinating discharge: 40 minutes  SIGNED:  Nilda Fendt, MD, PhD 11/19/2024, 10:01 AM

## 2024-11-19 NOTE — Progress Notes (Signed)
 Discharge   Patient expressed verbal understanding of discharge POC.   Patient given time to ask any questions.  Additional education included in AVS.  Alert oriented in good spirits.   No tele or PIV in place during discharge.  Pressure dressings intact.  Patient instructed to remove left flank guaze dressing in 24 hours per Critical Care surgical team and observe for signs of infection.  Discharging to Main A.  TOC meds in progress.

## 2024-11-19 NOTE — Plan of Care (Signed)

## 2024-11-19 NOTE — Progress Notes (Signed)
   11/19/24 1100  Spiritual Encounters  Type of Visit Initial  Care provided to: Pt and family  Referral source Nurse (RN/NT/LPN)  Reason for visit Advance directives  OnCall Visit No    Chaplain responded to consult request for Advance Directives. The patient stated that he already has a HCPOA document and no need to update it this time. Chaplain was present and provided support. Chaplains remain available if needed.   M.Kubra Susanna Kerry Resident [phone]

## 2024-11-19 NOTE — Plan of Care (Signed)
  Problem: Education: Goal: Knowledge of General Education information will improve Description: Including pain rating scale, medication(s)/side effects and non-pharmacologic comfort measures Outcome: Progressing   Problem: Skin Integrity: Goal: Risk for impaired skin integrity will decrease Outcome: Progressing   Problem: Activity: Goal: Ability to tolerate increased activity will improve Outcome: Progressing   Problem: Respiratory: Goal: Ability to maintain adequate ventilation will improve Outcome: Progressing   Problem: Coping: Goal: Ability to adjust to condition or change in health will improve Outcome: Progressing

## 2024-11-20 ENCOUNTER — Telehealth (HOSPITAL_COMMUNITY): Payer: Self-pay

## 2024-11-20 NOTE — Telephone Encounter (Signed)
 Called to confirm/remind patient of [facility] appointment [facility] on 11/20/2024.   Appointment:   [] Confirmed  [x] Left mess   [] No answer/No voice mail  [] VM Full/unable to leave message  [] Phone not in service  Patient reminded to bring all medications and/or complete list.  Confirmed patient has transportation. Gave directions, instructed to utilize valet parking.

## 2024-11-21 ENCOUNTER — Ambulatory Visit (HOSPITAL_COMMUNITY)

## 2024-11-21 ENCOUNTER — Other Ambulatory Visit: Payer: Self-pay | Admitting: Internal Medicine

## 2024-11-21 ENCOUNTER — Encounter (HOSPITAL_COMMUNITY): Payer: Self-pay

## 2024-11-21 ENCOUNTER — Ambulatory Visit (HOSPITAL_COMMUNITY): Admission: RE | Admit: 2024-11-21 | Discharge: 2024-11-21 | Attending: Internal Medicine

## 2024-11-21 ENCOUNTER — Telehealth: Payer: Self-pay

## 2024-11-21 VITALS — BP 104/62 | HR 70 | Ht 69.0 in | Wt 153.4 lb

## 2024-11-21 DIAGNOSIS — I4892 Unspecified atrial flutter: Secondary | ICD-10-CM | POA: Diagnosis not present

## 2024-11-21 DIAGNOSIS — I493 Ventricular premature depolarization: Secondary | ICD-10-CM | POA: Insufficient documentation

## 2024-11-21 DIAGNOSIS — Z7984 Long term (current) use of oral hypoglycemic drugs: Secondary | ICD-10-CM | POA: Insufficient documentation

## 2024-11-21 DIAGNOSIS — I441 Atrioventricular block, second degree: Secondary | ICD-10-CM

## 2024-11-21 DIAGNOSIS — I429 Cardiomyopathy, unspecified: Secondary | ICD-10-CM | POA: Insufficient documentation

## 2024-11-21 DIAGNOSIS — Z8679 Personal history of other diseases of the circulatory system: Secondary | ICD-10-CM | POA: Insufficient documentation

## 2024-11-21 DIAGNOSIS — I5022 Chronic systolic (congestive) heart failure: Secondary | ICD-10-CM | POA: Diagnosis not present

## 2024-11-21 DIAGNOSIS — E119 Type 2 diabetes mellitus without complications: Secondary | ICD-10-CM | POA: Insufficient documentation

## 2024-11-21 DIAGNOSIS — Z79899 Other long term (current) drug therapy: Secondary | ICD-10-CM | POA: Insufficient documentation

## 2024-11-21 DIAGNOSIS — I2510 Atherosclerotic heart disease of native coronary artery without angina pectoris: Secondary | ICD-10-CM | POA: Insufficient documentation

## 2024-11-21 NOTE — Patient Instructions (Addendum)
 Medication Changes:  INCREASE FUROSEMIDE  TO 40MG  ONCE DAILY WITH OF POTASSIUM WITH THIS FOR 1 WEEK   THEN DECREASE TO FUROSEMIDE  20MG  ONCE DAILY WITH NORMAL POTASSIUM DOSAGE DAILY THEREAFTER   Special Instructions // Education:  ELZA OAR [phone] TO SCHEDULE CARDIAC MRI   Follow-Up in: 4 WEEKS AS SCHEDULED WITH APP CLINIC   At the [facility], you and your health needs are our priority. We have a designated team specialized in the treatment of Heart Failure. This Care Team includes your primary Heart Failure Specialized Cardiologist (physician), Advanced Practice Providers (APPs- Physician Assistants and Nurse Practitioners), and Pharmacist who all work together to provide you with the care you need, when you need it.   You may see any of the following providers on your designated Care Team at your next follow up:  Dr. Toribio Fuel Dr. Ezra Shuck Dr. Odis Brownie Greig Mosses, NP Caffie Shed, [place] [facility] [place], [place] Beckey Coe, NP Jordan Lee, NP Tinnie Redman, PharmD   Please be sure to bring in all your medications bottles to every appointment.   Need to Contact Us :  If you have any questions or concerns before your next appointment please send us  a message through [place] or call our office at [phone].    TO LEAVE A MESSAGE FOR THE NURSE SELECT OPTION 2, PLEASE LEAVE A MESSAGE INCLUDING: YOUR NAME DATE OF BIRTH CALL BACK NUMBER REASON FOR CALL**this is important as we prioritize the call backs  YOU WILL RECEIVE A CALL BACK THE SAME DAY AS LONG AS YOU CALL BEFORE 4:00 PM

## 2024-11-21 NOTE — Telephone Encounter (Signed)
 Copied from CRM [id number]. Topic: Clinical - Home Health Verbal Orders >> Nov 21, 2024  3:02 PM Charolett L wrote: Caller/Agency: [facility] Callback Number: [phone] Service Requested: Skilled Nursing Frequency: 1x for 5 weeks Any new concerns about the patient? Yes Med list from hospital patient stated that he is not taking glipiZIDE  (GLUCOTROL ) 5 MG tablet

## 2024-11-21 NOTE — Progress Notes (Signed)
 [facility] NOTE  Referring Physician: Geofm Glade PARAS, MD  Primary Care: Geofm Glade PARAS, MD HF Cardiology: Dr. Rolan  CC: CHF  HPI: Jeffery [name] is a 83 y.o. 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  Patient has had a long history of mild cardiomyopathy, EF in the 40-45% range, since around 2002. 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  St Jude CRT-P device placed.  Cath in 2002 showed mild nonobstructive CAD.  Echo in 4/23 and 12/24 showed EF 40-45%.  In the summer of 2025, he began to note increased exertional dyspnea.  He was SOB walking up 1/2 flight of stairs.  He was started on Lasix  20 mg daily later increased to 40 mg daily.  With the addition of Lasix , his breathing returned to baseline. Cardiolite in 8/25 showed partially reversible inferior defect with EF 29%.  To confirm the fall in EF, he had an echo done in 9/25 showing EF 25-30% with moderate RV dysfunction.    LHC/RHC in 10/25 showed nonobstructive CAD, normal filling pressures and CI 2.61.    Admitted earlier this month with LUL pneumonia / empyema. S/p thoracentesis 11/13/24. Required CT placement. Fluid cultures grew pansensitive strep.  Discharged with abx. Pulm f/u arranged.   Today he returns for AHF follow up with his wife. Overall feeling ok just recovering from pneumonia. Denies palpitations, CP, dizziness or PND/Orthopnea. Swelling in BLE, wearing compression socks. SOB 2/2 PNA. Appetite ok, slowly improving. Watches what he eats. No fever or chills. Does not weight daily.  Taking all medications.   Device interrogation today: AP 25%, BP 90%, AT/AF burden 8.1*, [place] up to 17. Impedance trending down. (Personally reviewed)    PMH: 1. Chronic LBBB 2. Chronic systolic CHF: Cardiomyopathy dating from as early as 2002. St Jude CRT-P device 2019.  - LHC (2002): Nonobstructive CAD. - Echo (4/23): EF 40-45% - Echo (12/24): EF 40-45%, mild  RV dysfunction.  - Cardiolite (8/25): EF 29%, partially reversible inferior defect.  - Echo (9/25): EF 25-30%, moderate RV dysfunction, PASP 41 mmHg.  - LHC/RHC (10/25): Nonobstructive CAD; mean RA 5, PA 37/15 mean 23, mean PCWP 9, CI 2.61, PVR 2.9 WU, PAPi 4.4.  3. 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  - St Jude CRT-P device placed in 2019.  4. Asthma 5. GERD 6. Type 2 diabetes  SH: Married, retired SCIENTIST, CLINICAL (HISTOCOMPATIBILITY AND IMMUNOGENETICS), lives in [place], nonsmoker, occasional ETOH (not heavy), no drugs.   Family History  Problem Relation Age of Onset   Heart attack Father        93s   Colon polyps Father    Heart failure Mother        90s   Subarachnoid hemorrhage Mother 57   Hypertension Mother    Subarachnoid hemorrhage Paternal Grandfather 18   Diabetes Maternal Aunt    Colon cancer Neg Hx    ROS: All systems reviewed and negative except as per HPI.   Current Outpatient Medications  Medication Sig Dispense Refill   albuterol  (PROVENTIL ) (2.5 MG/3ML) 0.083% nebulizer solution Take 3 mLs (2.5 mg total) by nebulization every 6 (six) hours as needed for wheezing or shortness of breath. 150 mL 5   albuterol  (VENTOLIN  HFA) 108 (90 Base) MCG/ACT inhaler TAKE 2 PUFFS BY MOUTH EVERY 6 HOURS AS NEEDED FOR WHEEZE OR SHORTNESS OF BREATH 6.7 each 11   amoxicillin -clavulanate (AUGMENTIN ) 400-57 MG/5ML suspension Take 10.9 mLs (  875 mg total) by mouth 2 (two) times daily for 42 days.  Will need to refill every 9 days 1000 mL 0   aspirin  EC 81 MG tablet Take 1 tablet (81 mg total) by mouth at bedtime. Swallow whole. 30 tablet 12   bisoprolol  (ZEBETA ) 5 MG tablet Take 0.5 tablets (2.5 mg total) by mouth daily. 15 tablet 3   dapagliflozin  propanediol (FARXIGA ) 10 MG TABS tablet Take 1 tablet (10 mg total) by mouth daily before breakfast. 90 tablet 3   fluticasone -salmeterol (ADVAIR HFA) 230-21 MCG/ACT inhaler Inhale 2 puffs into the lungs 2 (two) times daily.     furosemide  (LASIX ) 40 MG tablet Take 0.5 tablets  (20 mg total) by mouth daily. 90 tablet 3   glipiZIDE  (GLUCOTROL ) 5 MG tablet TAKE 1-2 TABLETS (5-10 MG TOTAL) BY MOUTH 2 (TWO) TIMES DAILY BEFORE A MEAL. 180 tablet 1   guaiFENesin  (ROBITUSSIN) 100 MG/5ML liquid Take 10 mLs by mouth 3 (three) times daily. 236 mL 0   ipratropium (ATROVENT ) 0.02 % nebulizer solution TAKE 2.5 ML (0.5 MG TOTAL) BY NEBULIZATION 4 TIMES A DAY (Patient taking differently: Take 0.5 mg by nebulization 2 (two) times daily as needed for shortness of breath or wheezing.) 125 mL 3   Lactobacillus (ACIDOPHILUS) 100 MG CAPS Take 1 capsule (100 mg total) by mouth 3 (three) times daily with meals. 126 capsule 0   LORazepam  (ATIVAN ) 1 MG tablet TAKE 1/2 TO 1 TABLET BY MOUTH NIGHTLY AS NEEDED. 30 tablet 5   metFORMIN  (GLUCOPHAGE ) 500 MG tablet TAKE 1 TABLET BY MOUTH TWICE A DAY WITH FOOD 180 tablet 1   mirtazapine  (REMERON ) 7.5 MG tablet Take 1-2 tablets (7.5-15 mg total) by mouth at bedtime. (Patient taking differently: Take 15 mg by mouth at bedtime.) 60 tablet 5   montelukast  (SINGULAIR ) 10 MG tablet Take 1 tablet (10 mg total) by mouth at bedtime. 100 tablet 3   nitroGLYCERIN  (NITROSTAT ) 0.4 MG SL tablet Place 1 tablet (0.4 mg total) under the tongue every 5 (five) minutes x 3 doses as needed for chest pain. 75 tablet 1   nystatin  (MYCOSTATIN ) 100000 UNIT/ML suspension Use as directed 5 mLs (500,000 Units total) in the mouth or throat 4 (four) times daily for 3 days. 60 mL 0   oxyCODONE  (OXY IR/ROXICODONE ) 5 MG immediate release tablet Take 1 tablet (5 mg total) by mouth every 6 (six) hours as needed for moderate pain (pain score 4-6). 24 tablet 0   potassium chloride  20 MEQ TBCR Take 0.5 tablets (10 mEq total) by mouth daily. 90 tablet 3   rosuvastatin  (CRESTOR ) 10 MG tablet TAKE 1 TABLET ON MONDAY, WEDNESDAY & FRIDAY. 45 tablet 3   spironolactone  (ALDACTONE ) 25 MG tablet Take 1 tablet (25 mg total) by mouth daily. 90 tablet 3   vitamin E 180 MG (400 UNITS) capsule Take 400 Units  by mouth daily.     No current facility-administered medications for this encounter.   BP 104/62   Pulse 70   Ht 5' 9 (1.753 m)   Wt 69.6 kg (153 lb 6.4 oz)   SpO2 94%   BMI 22.65 kg/m  General:  elderly appearing.  No respiratory difficulty. Arrived in [facility].  Neck: JVD does not appear elevated.  Cor: Regular rate & rhythm. No murmurs. Lungs: diminished, crackles at bases Extremities: +1-2 BLE edema. + TED hose Neuro: alert & oriented x 3. Affect pleasant.   Wt Readings from Last 3 Encounters:  11/21/24 69.6 kg (153 lb  6.4 oz)  11/19/24 68.8 kg (151 lb 10.8 oz)  10/30/24 66.2 kg (146 lb)    EKG V paced 72 bpm with PVC (Personally reviewed)    Assessment/Plan: 1. Chronic systolic CHF: Cardiomyopathy of uncertain etiology. H/o LBBB. He has had a mild cardiomyopathy with EF 40-45% documented for > 20 years.  LHC in 2002 showed nonobstructive CAD.  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  Worsened symptoms summer 2025, he was started on Lasix .  Cardiolite in 8/25 showed EF 29%, partially reversible inferior perfusion defect.  Echo in 9/25 showed EF 25-30%, moderate RV dysfunction, PASP 41 mmHg.  He has been BiV pacing appropriately. LHC/RHC in 10/25 showed no significant CAD, normal filling pressures, CI 2.61. Echo 9/25: EF 25-30%, LV with GHK, RV mod reduced, LA/RA mildly dilated, mild MR - NYHA class II-IIIa, limited by recent PNA - Increase Lasix  20>40 mg daily x1 week then back to 20 mg. Increase KDUR to 20 mEq daily for 1 week.  - Continue Farxiga  10 mg daily.  - Continue bisoprolol  2.5 daily.  - Continue spironolactone  25 mg daily  - Now off Valsartan  with soft BP.  - Labs reviewed from discharge 2 days ago, stable.  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  Would be reasonable to offer genetic testing.  Previously deferred.  - Cardiac MRI has been scheduled  for later this month to look for prior myocarditis, infiltrative disease, etc.  - If EF does not improve with medical management, can consider ICD though he is of advanced age.  Would probably not recommend unless he has a lot of delayed enhancement on cardiac MRI. Will rescheuled cMRI. No showed last month 2/2 being admitted. # provided today to reschedule.   2. Type 2 2nd degree AVB: Has St Jude CRT-P device.  As above, with combination of heart block and cardiomyopathy, consider Lamin, TTN, and SCN5A cardiomyopathies. 3. History of atrial flutter - not on A/C. - during admission tele showed paced rhythm.  - EKG today V paced with PVC - Continue bisoprolol   - Has upcoming f/u with a fib clinic.   Follow up in 4 months with Dr. Rolan or APP.   Beckey LITTIE Coe AGACNP-BC  11/21/2024

## 2024-11-22 NOTE — Telephone Encounter (Signed)
 Verbals given to [place] today.

## 2024-11-22 NOTE — Telephone Encounter (Signed)
 Okay for orders.   Has only been taking glipizde prn for when he is on steroids or glucose is very high

## 2024-11-27 ENCOUNTER — Ambulatory Visit: Payer: Self-pay

## 2024-11-27 NOTE — Telephone Encounter (Signed)
2nd attempt. Left message.

## 2024-11-27 NOTE — Telephone Encounter (Signed)
° ° °  Copied from CRM #[id number]. Topic: General - Other >> Nov 27, 2024 12:11 PM Franky GRADE wrote: Reason for CRM: Beth Nurse Hedda is calling to advise patient is complaining of stuffiness in patient's ears, mainly the right ear which is causing him not hear well. He is also losing weight after his hospital stay.

## 2024-11-27 NOTE — Telephone Encounter (Signed)
 FYI Only or Action Required?: FYI only for provider: appointment scheduled on 11/30/24.  Patient was last seen in primary care on 10/30/2024 by Geofm Glade PARAS, MD.  Called Nurse Triage reporting Ear Fullness.  Symptoms began several weeks ago.  Interventions attempted: Nothing.  Symptoms are: unchanged.  Triage Disposition: See PCP When Office is Open (Within 3 Days)  Patient/caregiver understands and will follow disposition?: Yes  Reason for Disposition  [1] Decreased hearing in 1 ear AND [2] gradual onset  Answer Assessment - Initial Assessment Questions 1. DESCRIPTION: What type of hearing problem are you having? Describe it for me. (e.g., complete hearing loss, partial loss)     Patient reports 30-60 minutes after waking up right ear will go numb and diminished hearing , goes away sometimes  2. LOCATION: One or both ears? If one, ask: Which ear?     Right ear  3. ONSET: When did this begin? Did it start suddenly or come on gradually?     Couple weeks ago  4. PATTERN: Does this come and go, or has it been constant since it started?     Consistent every morning  5. PAIN: Is there any pain in your ear(s)?  (Scale 0-10; or none, mild, moderate, severe)     Denies  6. CAUSE: What do you think is causing this hearing problem?     Unsure  7. OTHER SYMPTOMS: Do you have any other symptoms? (e.g., dizziness, ringing in ears)     Denies  Protocols used: Hearing Loss or Change-A-AH

## 2024-11-29 ENCOUNTER — Other Ambulatory Visit: Payer: Self-pay | Admitting: Internal Medicine

## 2024-11-29 ENCOUNTER — Telehealth: Payer: Self-pay

## 2024-11-29 ENCOUNTER — Other Ambulatory Visit: Payer: Self-pay

## 2024-11-29 DIAGNOSIS — J45909 Unspecified asthma, uncomplicated: Secondary | ICD-10-CM | POA: Diagnosis not present

## 2024-11-29 DIAGNOSIS — J181 Lobar pneumonia, unspecified organism: Secondary | ICD-10-CM | POA: Diagnosis not present

## 2024-11-29 DIAGNOSIS — I5022 Chronic systolic (congestive) heart failure: Secondary | ICD-10-CM | POA: Diagnosis not present

## 2024-11-29 DIAGNOSIS — E119 Type 2 diabetes mellitus without complications: Secondary | ICD-10-CM | POA: Diagnosis not present

## 2024-11-29 DIAGNOSIS — Z950 Presence of cardiac pacemaker: Secondary | ICD-10-CM | POA: Diagnosis not present

## 2024-11-29 DIAGNOSIS — I459 Conduction disorder, unspecified: Secondary | ICD-10-CM | POA: Diagnosis not present

## 2024-11-29 DIAGNOSIS — Z7951 Long term (current) use of inhaled steroids: Secondary | ICD-10-CM | POA: Diagnosis not present

## 2024-11-29 DIAGNOSIS — J470 Bronchiectasis with acute lower respiratory infection: Secondary | ICD-10-CM | POA: Diagnosis not present

## 2024-11-29 DIAGNOSIS — E785 Hyperlipidemia, unspecified: Secondary | ICD-10-CM | POA: Diagnosis not present

## 2024-11-29 DIAGNOSIS — I4892 Unspecified atrial flutter: Secondary | ICD-10-CM | POA: Diagnosis not present

## 2024-11-29 DIAGNOSIS — J439 Emphysema, unspecified: Secondary | ICD-10-CM | POA: Diagnosis not present

## 2024-11-29 DIAGNOSIS — Z7984 Long term (current) use of oral hypoglycemic drugs: Secondary | ICD-10-CM | POA: Diagnosis not present

## 2024-11-29 NOTE — Telephone Encounter (Addendum)
 Alert remote transmission: BiV pacing < limit, currently 87%, trending down in the setting of AF AF in progress, controlled rates, no OAC per EPIC HF diagnostics currently abnormal,   We are atrially undersensing and not accurately reporting true AF burden/events.  Suspect AF burden is much higher than what is being reported (9%).  Hx of atrial lead noise in 2020, RA sensitivity programmed at 1mv to cover up intermittent noise at the time.  Last noted false AMS due to noise determined EMI in 2022.     Reviewed with Dorise Arrant rep:  recommendations to test sensitivity bipolar and unipolar.  Determine if appropriate to drop to 0.56mv in order to obtain more accurate AF data.     Will copy just as an FYI to Dr. Almetta and J. Terra PA in AF clinic (appt on 12/29). .  Patient just seen by HF clinic and they are addressing HF logistic abnormality.  Spoke with patient.  He is home from hospital and doing well. Fluid retention is improving.  He is not symptomatic with Afib. He is going to call our device clinic either Monday or Tuesday of next week and let us  know when he can drop by for programming adjustments.  I ask that he give us  as much notice as possible.  If he cannot come next week, we can just see him on the 29th when he sees AF clinic and make adjustments then.

## 2024-11-29 NOTE — Progress Notes (Unsigned)
 Subjective:    Patient ID: Jeffery [name], male    DOB: April 08, 1941, 83 y.o.   MRN: [id number]      HPI Jeffery [name] is here for No chief complaint on file.        Medications and allergies reviewed with patient and updated if appropriate.  Medications Ordered Prior to Encounter[1]  Review of Systems     Objective:  There were no vitals filed for this visit. BP Readings from Last 3 Encounters:  11/21/24 104/62  11/19/24 96/64  10/30/24 (!) 140/70   Wt Readings from Last 3 Encounters:  11/21/24 153 lb 6.4 oz (69.6 kg)  11/19/24 151 lb 10.8 oz (68.8 kg)  10/30/24 146 lb (66.2 kg)   There is no height or weight on file to calculate BMI.    Physical Exam         Assessment & Plan:    See Problem List for Assessment and Plan of chronic medical problems.         [1]  Current Outpatient Medications on File Prior to Visit  Medication Sig Dispense Refill   albuterol  (PROVENTIL ) (2.5 MG/3ML) 0.083% nebulizer solution Take 3 mLs (2.5 mg total) by nebulization every 6 (six) hours as needed for wheezing or shortness of breath. 150 mL 5   albuterol  (VENTOLIN  HFA) 108 (90 Base) MCG/ACT inhaler TAKE 2 PUFFS BY MOUTH EVERY 6 HOURS AS NEEDED FOR WHEEZE OR SHORTNESS OF BREATH 6.7 each 11   amoxicillin -clavulanate (AUGMENTIN ) 400-57 MG/5ML suspension Take 10.9 mLs (875 mg total) by mouth 2 (two) times daily for 42 days.  Will need to refill every 9 days 1000 mL 0   aspirin  EC 81 MG tablet Take 1 tablet (81 mg total) by mouth at bedtime. Swallow whole. 30 tablet 12   bisoprolol  (ZEBETA ) 5 MG tablet Take 0.5 tablets (2.5 mg total) by mouth daily. 15 tablet 3   dapagliflozin  propanediol (FARXIGA ) 10 MG TABS tablet Take 1 tablet (10 mg total) by mouth daily before breakfast. 90 tablet 3   fluticasone -salmeterol (ADVAIR HFA) 230-21 MCG/ACT inhaler Inhale 2 puffs into the lungs 2 (two) times daily.     furosemide  (LASIX ) 40 MG tablet Take 0.5 tablets (20 mg total) by mouth  daily. 90 tablet 3   glipiZIDE  (GLUCOTROL ) 5 MG tablet TAKE 1-2 TABLETS (5-10 MG TOTAL) BY MOUTH 2 (TWO) TIMES DAILY BEFORE A MEAL. 180 tablet 1   guaiFENesin  (ROBITUSSIN) 100 MG/5ML liquid Take 10 mLs by mouth 3 (three) times daily. 236 mL 0   ipratropium (ATROVENT ) 0.02 % nebulizer solution TAKE 2.5 ML (0.5 MG TOTAL) BY NEBULIZATION 4 TIMES A DAY (Patient taking differently: Take 0.5 mg by nebulization 2 (two) times daily as needed for shortness of breath or wheezing.) 125 mL 3   Lactobacillus (ACIDOPHILUS) 100 MG CAPS Take 1 capsule (100 mg total) by mouth 3 (three) times daily with meals. 126 capsule 0   LORazepam  (ATIVAN ) 1 MG tablet TAKE 1/2 TO 1 TABLET BY MOUTH NIGHTLY AS NEEDED. 30 tablet 5   metFORMIN  (GLUCOPHAGE ) 500 MG tablet TAKE 1 TABLET BY MOUTH TWICE A DAY WITH FOOD 180 tablet 1   mirtazapine  (REMERON ) 7.5 MG tablet TAKE 1-2 TABLETS (7.5-15 MG TOTAL) BY MOUTH AT BEDTIME. 180 tablet 2   montelukast  (SINGULAIR ) 10 MG tablet Take 1 tablet (10 mg total) by mouth at bedtime. 100 tablet 3   nitroGLYCERIN  (NITROSTAT ) 0.4 MG SL tablet Place 1 tablet (0.4 mg total) under the tongue every 5 (five)  minutes x 3 doses as needed for chest pain. 75 tablet 1   oxyCODONE  (OXY IR/ROXICODONE ) 5 MG immediate release tablet Take 1 tablet (5 mg total) by mouth every 6 (six) hours as needed for moderate pain (pain score 4-6). 24 tablet 0   potassium chloride  20 MEQ TBCR Take 0.5 tablets (10 mEq total) by mouth daily. 90 tablet 3   rosuvastatin  (CRESTOR ) 10 MG tablet TAKE 1 TABLET ON MONDAY, WEDNESDAY & FRIDAY. 45 tablet 3   spironolactone  (ALDACTONE ) 25 MG tablet Take 1 tablet (25 mg total) by mouth daily. 90 tablet 3   vitamin E 180 MG (400 UNITS) capsule Take 400 Units by mouth daily.     No current facility-administered medications on file prior to visit.

## 2024-11-30 ENCOUNTER — Ambulatory Visit (INDEPENDENT_AMBULATORY_CARE_PROVIDER_SITE_OTHER): Admitting: Internal Medicine

## 2024-11-30 ENCOUNTER — Encounter: Payer: Self-pay | Admitting: Internal Medicine

## 2024-11-30 VITALS — BP 116/68 | HR 68 | Temp 98.0°F | Ht 69.0 in | Wt 138.0 lb

## 2024-11-30 DIAGNOSIS — M150 Primary generalized (osteo)arthritis: Secondary | ICD-10-CM

## 2024-11-30 DIAGNOSIS — H6993 Unspecified Eustachian tube disorder, bilateral: Secondary | ICD-10-CM

## 2024-11-30 DIAGNOSIS — E43 Unspecified severe protein-calorie malnutrition: Secondary | ICD-10-CM | POA: Diagnosis not present

## 2024-11-30 DIAGNOSIS — B370 Candidal stomatitis: Secondary | ICD-10-CM

## 2024-11-30 MED ORDER — NYSTATIN 100000 UNIT/ML MT SUSP: 5.0000 mL | Freq: Four times a day (QID) | OROMUCOSAL | 0 refills | Status: AC

## 2024-11-30 MED ORDER — HYDROCODONE-ACETAMINOPHEN 7.5-325 MG PO TABS: 1.0000 | ORAL_TABLET | Freq: Four times a day (QID) | ORAL | 0 refills | Status: DC | PRN

## 2024-11-30 MED ORDER — LEVOCETIRIZINE DIHYDROCHLORIDE 5 MG PO TABS: 5.0000 mg | ORAL_TABLET | Freq: Every evening | ORAL | 2 refills | Status: AC

## 2024-11-30 MED ORDER — FLUTICASONE PROPIONATE 50 MCG/ACT NA SUSP: 2.0000 | Freq: Every day | NASAL | 6 refills | Status: AC

## 2024-11-30 NOTE — Patient Instructions (Addendum)
" ° ° ° ° °  Start a liquid multivitamin   Start an otc anti-histamine - xyzal , flonase  and pop your ears frequently    Flonase , hydrocodone , levocetrizine (xyzal ) and nystatin  was sent to the hospital     "

## 2024-12-01 DIAGNOSIS — E43 Unspecified severe protein-calorie malnutrition: Secondary | ICD-10-CM | POA: Insufficient documentation

## 2024-12-01 DIAGNOSIS — B370 Candidal stomatitis: Secondary | ICD-10-CM | POA: Insufficient documentation

## 2024-12-01 NOTE — Assessment & Plan Note (Signed)
 Subacute Significant protein and calorie malnutrition His appetite is improving and he is advancing his diet Stressed increased protein - continue protein drink with 30 g protein Add liquid multivitamin Continue to slowly increase calorie intake

## 2024-12-01 NOTE — Assessment & Plan Note (Signed)
 Subacute Treated for thrush in the hospital  Still has some irritation on tongue Start nystatin  swish and spit

## 2024-12-01 NOTE — Assessment & Plan Note (Signed)
 Acute B/l ear symptoms related to ETD - normal exam Likely related to decreased mastication +/- allergies Start xyzal , flonase  Pop ears several times a day

## 2024-12-01 NOTE — Assessment & Plan Note (Signed)
 Chronic Generalized Stable  Pain management with Norco as needed Pain controlled.  Taking medication appropriately. Continue Norco 7.5-325 mg 1 tab every 6 hours as needed Prescription sent to pharmacy

## 2024-12-03 NOTE — Telephone Encounter (Signed)
 Scheduled Pt with device clinic after afib appointment.

## 2024-12-04 ENCOUNTER — Telehealth: Payer: Self-pay

## 2024-12-04 NOTE — Telephone Encounter (Signed)
 Copied from CRM [id number]. Topic: General - Other >> Dec 04, 2024 10:51 AM Tinnie BROCKS wrote: Reason for CRM: Beth with [facility] calling to let us  know that pt is similar to when she saw him last, sleeping and laying down a lot. Pt says he is open to trying an antidepressant as he is very stressed with the health of him and his wife. If Dr. Geofm is willing to send in antidepressant for him, Landry would like a call back at [phone] to notify her. She says this is a secure VM box, full VM can be left.

## 2024-12-04 NOTE — Telephone Encounter (Signed)
 Spoke with [facility] and she will have patient increase to 2 pills at night for now.  She will check back in next week based on how he is doing.  Appetite is better.

## 2024-12-04 NOTE — Telephone Encounter (Signed)
 He is on mirtazapine  at night which is an anti-depressant.  If he is taking 1 pill I would recommend taking 2 at night.  If he is taking 2 we can increase to 30 mg.   This also helps with appetite and sleep.    The alternative is to add a second anti-depressant to this one.

## 2024-12-07 ENCOUNTER — Telehealth: Payer: Self-pay | Admitting: Student in an Organized Health Care Education/Training Program

## 2024-12-07 DIAGNOSIS — I4819 Other persistent atrial fibrillation: Secondary | ICD-10-CM

## 2024-12-07 MED ORDER — APIXABAN 5 MG PO TABS: 5.0000 mg | ORAL_TABLET | Freq: Two times a day (BID) | ORAL | 3 refills | Status: AC

## 2024-12-07 NOTE — Telephone Encounter (Signed)
 Reviewed recent device interrogation with persistent AF.  Asked patient to start Eliquis  and discontinue aspirin .  He understands this and has an appointment this upcoming week with AF clinic.

## 2024-12-10 ENCOUNTER — Ambulatory Visit

## 2024-12-10 ENCOUNTER — Ambulatory Visit (HOSPITAL_COMMUNITY)
Admission: RE | Admit: 2024-12-10 | Discharge: 2024-12-10 | Disposition: A | Discharge status: Home / Self Care | Source: Ambulatory Visit | Attending: Internal Medicine | Admitting: Internal Medicine

## 2024-12-10 ENCOUNTER — Ambulatory Visit (HOSPITAL_COMMUNITY): Admission: RE | Admit: 2024-12-10 | Source: Ambulatory Visit | Admitting: Internal Medicine

## 2024-12-10 VITALS — BP 94/58 | HR 71 | Ht 69.0 in | Wt 132.4 lb

## 2024-12-10 DIAGNOSIS — I4891 Unspecified atrial fibrillation: Secondary | ICD-10-CM

## 2024-12-10 DIAGNOSIS — I4892 Unspecified atrial flutter: Secondary | ICD-10-CM

## 2024-12-10 DIAGNOSIS — I4819 Other persistent atrial fibrillation: Secondary | ICD-10-CM

## 2024-12-10 DIAGNOSIS — D6869 Other thrombophilia: Secondary | ICD-10-CM

## 2024-12-10 DIAGNOSIS — I5022 Chronic systolic (congestive) heart failure: Secondary | ICD-10-CM

## 2024-12-10 LAB — CUP PACEART INCLINIC DEVICE CHECK
Battery Remaining Longevity: 14 mo
Battery Voltage: 2.89 V
Brady Statistic RA Percent Paced: 27 %
Brady Statistic RV Percent Paced: 88 %
Date Time Interrogation Session: 20251229121927
Implantable Lead Connection Status: 753985
Implantable Lead Connection Status: 753985
Implantable Lead Connection Status: 753985
Implantable Lead Implant Date: 20190813
Implantable Lead Implant Date: 20190813
Implantable Lead Implant Date: 20190813
Implantable Lead Location: 753858
Implantable Lead Location: 753859
Implantable Lead Location: 753860
Implantable Pulse Generator Implant Date: 20190813
Lead Channel Impedance Value: 400 Ohm
Lead Channel Impedance Value: 425 Ohm
Lead Channel Impedance Value: 487.5 Ohm
Lead Channel Pacing Threshold Amplitude: 0 V
Lead Channel Pacing Threshold Amplitude: 1.5 V
Lead Channel Pacing Threshold Pulse Width: 0.5 ms
Lead Channel Pacing Threshold Pulse Width: 0.5 ms
Lead Channel Sensing Intrinsic Amplitude: 1.5 mV
Lead Channel Sensing Intrinsic Amplitude: 12 mV
Lead Channel Setting Pacing Amplitude: 2 V
Lead Channel Setting Pacing Amplitude: 2.5 V
Lead Channel Setting Pacing Amplitude: 2.5 V
Lead Channel Setting Pacing Pulse Width: 0.5 ms
Lead Channel Setting Pacing Pulse Width: 0.5 ms
Lead Channel Setting Sensing Sensitivity: 4 mV
Pulse Gen Model: 3562
Pulse Gen Serial Number: 9043864

## 2024-12-10 NOTE — Progress Notes (Incomplete)
 "   Primary Care Physician: Geofm Glade PARAS, MD Primary Cardiologist: None Electrophysiologist: Danelle Birmingham, MD  {Click to update primary MD,subspecialty MD or APP then REFRESH:1}   Referring Physician: Device clinic  {Removed [facility], PMH, PSH, ALLERGY, CMED, and SOC :1}   Jeffery [name] is a 83 y.o. male with a history of chronic systolic CHF, T2DM, Stokes-Adams syncope s/p PPM, HTN, hyperlipidemia, CAD, and persistent atrial fibrillation who presents for consultation in the [facility].  Device clinic alerted on 12/18 for persistent A-fib with controlled rates.  Patient contacted by Dr. Almetta on 12/26 and aspirin  stopped and Eliquis  started.  Patient is on Eliquis  for stroke prevention.  On evaluation today, patient is currently in***.  He has not missed any doses of Eliquis  since starting medication on 12/27??  No bleeding issues on Eliquis .  Today, he denies symptoms of ***palpitations, chest pain, shortness of breath, orthopnea, PND, lower extremity edema, dizziness, presyncope, syncope, bleeding, or neurologic sequela. The patient is tolerating medications without difficulties and is otherwise without complaint today.    Atrial Fibrillation Risk Factors:  he {Action; does/does not:19097} have symptoms or diagnosis of sleep apnea. he {ACTION; IS/IS [id number]} compliant with CPAP therapy. he {Action; does/does not:19097} have a history of rheumatic fever. he {Action; does/does not:19097} have a history of alcohol use. The patient {Action; does/does not:19097} have a history of early familial atrial fibrillation or other arrhythmias.  he has a BMI of There is no height or weight on file to calculate BMI.. There were no vitals filed for this visit.  Current Outpatient Medications  Medication Sig Dispense Refill   albuterol  (PROVENTIL ) (2.5 MG/3ML) 0.083% nebulizer solution Take 3 mLs (2.5 mg total) by nebulization every 6 (six) hours as needed for  wheezing or shortness of breath. 150 mL 5   albuterol  (VENTOLIN  HFA) 108 (90 Base) MCG/ACT inhaler TAKE 2 PUFFS BY MOUTH EVERY 6 HOURS AS NEEDED FOR WHEEZE OR SHORTNESS OF BREATH 6.7 each 11   amoxicillin -clavulanate (AUGMENTIN ) 400-57 MG/5ML suspension Take 10.9 mLs (875 mg total) by mouth 2 (two) times daily for 42 days.  Will need to refill every 9 days 1000 mL 0   apixaban  (ELIQUIS ) 5 MG TABS tablet Take 1 tablet (5 mg total) by mouth 2 (two) times daily. 180 tablet 3   bisoprolol  (ZEBETA ) 5 MG tablet Take 0.5 tablets (2.5 mg total) by mouth daily. 15 tablet 3   Continuous Glucose Sensor (FREESTYLE LIBRE 14 DAY SENSOR) MISC SMARTSIG:1 Topical Every 2 Weeks     dapagliflozin  propanediol (FARXIGA ) 10 MG TABS tablet Take 1 tablet (10 mg total) by mouth daily before breakfast. 90 tablet 3   fluticasone  (FLONASE ) 50 MCG/ACT nasal spray Place 2 sprays into both nostrils daily. 16 g 6   fluticasone -salmeterol (ADVAIR HFA) 230-21 MCG/ACT inhaler Inhale 2 puffs into the lungs 2 (two) times daily.     furosemide  (LASIX ) 40 MG tablet Take 0.5 tablets (20 mg total) by mouth daily. 90 tablet 3   glipiZIDE  (GLUCOTROL ) 5 MG tablet TAKE 1-2 TABLETS (5-10 MG TOTAL) BY MOUTH 2 (TWO) TIMES DAILY BEFORE A MEAL. 180 tablet 1   guaiFENesin  (ROBITUSSIN) 100 MG/5ML liquid Take 10 mLs by mouth 3 (three) times daily. 236 mL 0   HYDROcodone -acetaminophen  (NORCO) 7.5-325 MG tablet Take 1 tablet by mouth every 6 (six) hours as needed for severe pain (pain score 7-10) (for chronic joint pain, neck pain). 90 tablet 0   ipratropium (ATROVENT ) 0.02 % nebulizer solution  TAKE 2.5 ML (0.5 MG TOTAL) BY NEBULIZATION 4 TIMES A DAY (Patient taking differently: Take 0.5 mg by nebulization 2 (two) times daily as needed for shortness of breath or wheezing.) 125 mL 3   Lactobacillus (ACIDOPHILUS) 100 MG CAPS Take 1 capsule (100 mg total) by mouth 3 (three) times daily with meals. 126 capsule 0   levocetirizine (XYZAL ) 5 MG tablet Take 1  tablet (5 mg total) by mouth every evening. 30 tablet 2   LORazepam  (ATIVAN ) 1 MG tablet TAKE 1/2 TO 1 TABLET BY MOUTH NIGHTLY AS NEEDED. 30 tablet 5   metFORMIN  (GLUCOPHAGE ) 500 MG tablet TAKE 1 TABLET BY MOUTH TWICE A DAY WITH FOOD 180 tablet 1   mirtazapine  (REMERON ) 7.5 MG tablet TAKE 1-2 TABLETS (7.5-15 MG TOTAL) BY MOUTH AT BEDTIME. 180 tablet 2   montelukast  (SINGULAIR ) 10 MG tablet Take 1 tablet (10 mg total) by mouth at bedtime. 100 tablet 3   nitroGLYCERIN  (NITROSTAT ) 0.4 MG SL tablet Place 1 tablet (0.4 mg total) under the tongue every 5 (five) minutes x 3 doses as needed for chest pain. 75 tablet 1   nystatin  (MYCOSTATIN ) 100000 UNIT/ML suspension Take 5 mLs (500,000 Units total) by mouth 4 (four) times daily. Swish and spit.  Use for 10 days 200 mL 0   potassium chloride  20 MEQ TBCR Take 0.5 tablets (10 mEq total) by mouth daily. 90 tablet 3   rosuvastatin  (CRESTOR ) 10 MG tablet TAKE 1 TABLET ON MONDAY, WEDNESDAY & FRIDAY. 45 tablet 3   spironolactone  (ALDACTONE ) 25 MG tablet Take 1 tablet (25 mg total) by mouth daily. 90 tablet 3   vitamin E 180 MG (400 UNITS) capsule Take 400 Units by mouth daily.     No current facility-administered medications for this visit.    Atrial Fibrillation Management history:  Previous antiarrhythmic drugs: none Previous cardioversions: none Previous ablations: none Anticoagulation history: Eliquis    ROS- All systems are reviewed and negative except as per the HPI above.  Physical Exam: There were no vitals taken for this visit.  GEN: Well nourished, well developed in no acute distress NECK: No JVD; No carotid bruits CARDIAC: {EPRHYTHM:28826}, no murmurs, rubs, gallops RESPIRATORY:  Clear to auscultation without rales, wheezing or rhonchi  ABDOMEN: Soft, non-tender, non-distended EXTREMITIES:  No edema; No deformity   EKG today demonstrates ***  Echo 08/23/24 demonstrated   1. Left ventricular ejection fraction, by estimation, is 25 to  30%. The  left ventricle has severely decreased function. The left ventricle  demonstrates global hypokinesis. There is mild concentric left ventricular  hypertrophy. Indeterminate diastolic  filling due to E-A fusion.   2. Right ventricular systolic function is moderately reduced. The right  ventricular size is moderately enlarged. There is mildly elevated  pulmonary artery systolic pressure. The estimated right ventricular  systolic pressure is 40.9 mmHg.   3. Left atrial size was mildly dilated.   4. Right atrial size was mildly dilated.   5. The mitral valve is grossly normal. Mild mitral valve regurgitation.  No evidence of mitral stenosis.   6. The aortic valve is tricuspid. Aortic valve regurgitation is not  visualized. No aortic stenosis is present.   7. The inferior vena cava is normal in size with greater than 50%  respiratory variability, suggesting right atrial pressure of 3 mmHg.   ASSESSMENT & PLAN CHA2DS2-VASc Score =    The patient's score is based upon:   {Click here to calculate score.  REFRESH note before signing. :1}  ASSESSMENT AND PLAN: {Select the correct AFib Diagnosis                 :[id number]}  Persistent  Chads 6 CHF CAD T2DM HTN  No missed dose of Eliquis  5 mg twice daily.  Dosage is correct based on weight greater than 60 kg and creatinine less than 1.5.    Follow up ***   Terra Pac, [facility] [address] [place], [place] [address] [phone]  "

## 2024-12-10 NOTE — Progress Notes (Signed)
 "   Primary Care Physician: Geofm Glade PARAS, MD Primary Cardiologist: None Electrophysiologist: Danelle Birmingham, MD     Referring Physician: Device clinic     Jeffery [name] is a 83 y.o. male with a history of chronic systolic CHF, T2DM, Stokes-Adams syncope s/p PPM, HTN, hyperlipidemia, CAD, and persistent atrial fibrillation who presents for consultation in the [facility].  Device clinic alerted on 12/18 for persistent A-fib with controlled rates.  Patient contacted by Dr. Almetta on 12/26 and aspirin  stopped and Eliquis  started.  Patient is on Eliquis  for stroke prevention.  On evaluation today, patient is currently in V paced rhythm.  He missed this morning's dose of Eliquis  due to accidental delay of device clinic visit.  He does not appear to have cardiac awareness of arrhythmia.  No bleeding issues on Eliquis .  Today, he denies symptoms of palpitations, chest pain, shortness of breath, orthopnea, PND, lower extremity edema, dizziness, presyncope, syncope, bleeding, or neurologic sequela. The patient is tolerating medications without difficulties and is otherwise without complaint today.   he has a BMI of Body mass index is 19.55 kg/m.SABRA Filed Weights   12/10/24 1128  Weight: 60.1 kg    Current Outpatient Medications  Medication Sig Dispense Refill   albuterol  (PROVENTIL ) (2.5 MG/3ML) 0.083% nebulizer solution Take 3 mLs (2.5 mg total) by nebulization every 6 (six) hours as needed for wheezing or shortness of breath. 150 mL 5   albuterol  (VENTOLIN  HFA) 108 (90 Base) MCG/ACT inhaler TAKE 2 PUFFS BY MOUTH EVERY 6 HOURS AS NEEDED FOR WHEEZE OR SHORTNESS OF BREATH 6.7 each 11   amoxicillin -clavulanate (AUGMENTIN ) 400-57 MG/5ML suspension Take 10.9 mLs (875 mg total) by mouth 2 (two) times daily for 42 days.  Will need to refill every 9 days 1000 mL 0   apixaban  (ELIQUIS ) 5 MG TABS tablet Take 1 tablet (5 mg total) by mouth 2 (two) times daily. 180 tablet 3    bisoprolol  (ZEBETA ) 5 MG tablet Take 0.5 tablets (2.5 mg total) by mouth daily. 15 tablet 3   Continuous Glucose Sensor (FREESTYLE LIBRE 14 DAY SENSOR) MISC SMARTSIG:1 Topical Every 2 Weeks     dapagliflozin  propanediol (FARXIGA ) 10 MG TABS tablet Take 1 tablet (10 mg total) by mouth daily before breakfast. 90 tablet 3   fluticasone -salmeterol (ADVAIR HFA) 230-21 MCG/ACT inhaler Inhale 2 puffs into the lungs 2 (two) times daily.     furosemide  (LASIX ) 40 MG tablet Take 0.5 tablets (20 mg total) by mouth daily. 90 tablet 3   glipiZIDE  (GLUCOTROL ) 5 MG tablet TAKE 1-2 TABLETS (5-10 MG TOTAL) BY MOUTH 2 (TWO) TIMES DAILY BEFORE A MEAL. (Patient taking differently: Take 5-10 mg by mouth as needed.) 180 tablet 1   HYDROcodone -acetaminophen  (NORCO) 7.5-325 MG tablet Take 1 tablet by mouth every 6 (six) hours as needed for severe pain (pain score 7-10) (for chronic joint pain, neck pain). (Patient taking differently: Take 1 tablet by mouth as needed for severe pain (pain score 7-10) (for chronic joint pain, neck pain).) 90 tablet 0   ipratropium (ATROVENT ) 0.02 % nebulizer solution TAKE 2.5 ML (0.5 MG TOTAL) BY NEBULIZATION 4 TIMES A DAY (Patient taking differently: Take 0.5 mg by nebulization as needed for shortness of breath or wheezing.) 125 mL 3   Lactobacillus (ACIDOPHILUS) 100 MG CAPS Take 1 capsule (100 mg total) by mouth 3 (three) times daily with meals. 126 capsule 0   levocetirizine (XYZAL ) 5 MG tablet Take 1 tablet (5 mg total) by  mouth every evening. 30 tablet 2   LORazepam  (ATIVAN ) 1 MG tablet TAKE 1/2 TO 1 TABLET BY MOUTH NIGHTLY AS NEEDED. 30 tablet 5   metFORMIN  (GLUCOPHAGE ) 500 MG tablet TAKE 1 TABLET BY MOUTH TWICE A DAY WITH FOOD 180 tablet 1   mirtazapine  (REMERON ) 7.5 MG tablet TAKE 1-2 TABLETS (7.5-15 MG TOTAL) BY MOUTH AT BEDTIME. (Patient taking differently: Take 15 mg by mouth at bedtime.) 180 tablet 2   montelukast  (SINGULAIR ) 10 MG tablet Take 1 tablet (10 mg total) by mouth at  bedtime. 100 tablet 3   nitroGLYCERIN  (NITROSTAT ) 0.4 MG SL tablet Place 1 tablet (0.4 mg total) under the tongue every 5 (five) minutes x 3 doses as needed for chest pain. 75 tablet 1   nystatin  (MYCOSTATIN ) 100000 UNIT/ML suspension Take 5 mLs (500,000 Units total) by mouth 4 (four) times daily. Swish and spit.  Use for 10 days (Patient taking differently: Take 5 mLs by mouth as needed. Swish and spit.  Use for 10 days) 200 mL 0   potassium chloride  20 MEQ TBCR Take 0.5 tablets (10 mEq total) by mouth daily. 90 tablet 3   rosuvastatin  (CRESTOR ) 10 MG tablet TAKE 1 TABLET ON MONDAY, WEDNESDAY & FRIDAY. 45 tablet 3   spironolactone  (ALDACTONE ) 25 MG tablet Take 1 tablet (25 mg total) by mouth daily. 90 tablet 3   fluticasone  (FLONASE ) 50 MCG/ACT nasal spray Place 2 sprays into both nostrils daily. (Patient not taking: Reported on 12/10/2024) 16 g 6   vitamin E 180 MG (400 UNITS) capsule Take 400 Units by mouth daily. (Patient not taking: Reported on 12/10/2024)     No current facility-administered medications for this encounter.    Atrial Fibrillation Management history:  Previous antiarrhythmic drugs: none Previous cardioversions: none Previous ablations: none Anticoagulation history: Eliquis    ROS- All systems are reviewed and negative except as per the HPI above.  Physical Exam: BP (!) 94/58   Pulse 71   Ht 5' 9 (1.753 m)   Wt 60.1 kg   BMI 19.55 kg/m   GEN: Well nourished, well developed in no acute distress NECK: No JVD; No carotid bruits CARDIAC: Regular rate (V-paced) with ectopy noted, no murmurs, rubs, gallops RESPIRATORY:  Clear to auscultation without rales, wheezing or rhonchi  ABDOMEN: Soft, non-tender, non-distended EXTREMITIES:  No edema; No deformity   EKG today demonstrates  EKG Interpretation Date/Time:  Monday December 10 2024 11:46:51 EST Ventricular Rate:  71 PR Interval:    QRS Duration:  130 QT Interval:  458 QTC Calculation: 497 R  Axis:   253  Text Interpretation: Ventricular-paced rhythm with Premature atrial complexes with Abberant conduction Abnormal ECG When compared with ECG of 21-Nov-2024 09:27,  Underlying atrial flutter still appears present Confirmed by Terra Pac [phone]) on 12/10/2024 1:40:39 PM    Echo 08/23/24 demonstrated   1. Left ventricular ejection fraction, by estimation, is 25 to 30%. The  left ventricle has severely decreased function. The left ventricle  demonstrates global hypokinesis. There is mild concentric left ventricular  hypertrophy. Indeterminate diastolic  filling due to E-A fusion.   2. Right ventricular systolic function is moderately reduced. The right  ventricular size is moderately enlarged. There is mildly elevated  pulmonary artery systolic pressure. The estimated right ventricular  systolic pressure is 40.9 mmHg.   3. Left atrial size was mildly dilated.   4. Right atrial size was mildly dilated.   5. The mitral valve is grossly normal. Mild mitral valve regurgitation.  No  evidence of mitral stenosis.   6. The aortic valve is tricuspid. Aortic valve regurgitation is not  visualized. No aortic stenosis is present.   7. The inferior vena cava is normal in size with greater than 50%  respiratory variability, suggesting right atrial pressure of 3 mmHg.   ASSESSMENT & PLAN CHA2DS2-VASc Score = 6  The patient's score is based upon: CHF History: 1 HTN History: 1 Diabetes History: 1 Stroke History: 0 Vascular Disease History: 1 Age Score: 2 Gender Score: 0       ASSESSMENT AND PLAN: Persistent Atrial flutter (ICD10:  I48.19) The patient's CHA2DS2-VASc score is 6, indicating a 9.7% annual risk of stroke.    Patient is currently in V-paced rhythm. Education provided about Afib. Discussion about pathophysiology of arrhythmia.  We went over the procedure cardioversion and how it tries to restore sinus rhythm for patient's and persistent atrial fibrillation/flutter.  Due to  patient's systolic heart failure, it would be prudent to consider cardioversion at next office visit even if patient does not have cardiac awareness of arrhythmia.  He is approximately 5 hours past his morning dose of Eliquis  so we will have to call it a missed dose for this morning.  He will resume his Eliquis  tonight.  Continue bisoprolol  2.5 mg daily.   Secondary Hypercoagulable State (ICD10:  D68.69) The patient is at significant risk for stroke/thromboembolism based upon his CHA2DS2-VASc Score of 6.  Continue Apixaban  (Eliquis ).  Continue Eliquis  5 mg twice daily without interruption. Dosage is correct based on weight greater than 60 kg and creatinine less than 1.5. We discussed the reasoning behind anticoagulation in the setting of stroke prevention related to Afib. We discussed the benefits vs risks of anticoagulation. After discussion, patient would like to continue anticoagulation.     Follow up 3 weeks to determine if needs cardioversion.   Terra Pac, [facility]  Afib Clinic [address] [place], [place] [address] [phone]  "

## 2024-12-10 NOTE — Progress Notes (Signed)
 Patient seen in device clinic for programming changes per apt note.  Changes made to session: 1. RA sensitivity programmed from 1.0 mV to 0.5 mV. 2. RA CC programmed from ON to MONITOR. 3. RA Output programmed at a fixed output of 2.0V. RV sensitivity programmed from 5.12mV to 4.0 mV.

## 2024-12-11 ENCOUNTER — Other Ambulatory Visit: Payer: Self-pay | Admitting: Internal Medicine

## 2024-12-12 ENCOUNTER — Telehealth (HOSPITAL_COMMUNITY): Payer: Self-pay

## 2024-12-12 NOTE — Progress Notes (Incomplete)
 "  ADVANCED HEART FAILURE CLINIC NOTE  Primary Care: Geofm Glade PARAS, MD HF Cardiology: Dr. Rolan  HPI: Jeffery [name] is a 83 y.o. 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  Patient has had a long history of mild cardiomyopathy, EF in the 40-45% range, since around 2002. 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  St Jude CRT-P device placed.  Cath in 2002 showed mild nonobstructive CAD.  Echo in 4/23 and 12/24 showed EF 40-45%.  In the summer of 2025, he began to note increased exertional dyspnea.  He was SOB walking up 1/2 flight of stairs.  He was started on Lasix  20 mg daily later increased to 40 mg daily.  With the addition of Lasix , his breathing returned to baseline. Cardiolite in 8/25 showed partially reversible inferior defect with EF 29%.  To confirm the fall in EF, he had an echo done in 9/25 showing EF 25-30% with moderate RV dysfunction.    LHC/RHC in 10/25 showed nonobstructive CAD, normal filling pressures and CI 2.61.    Admitted earlier this month with LUL pneumonia / empyema. S/p thoracentesis 11/13/24. Required CT placement. Fluid cultures grew pansensitive strep.  Discharged with abx. Pulm f/u arranged.   Today he returns for AHF follow up with his wife. Overall feeling ok just recovering from pneumonia. Denies palpitations, CP, dizziness or PND/Orthopnea. Swelling in BLE, wearing compression socks. SOB 2/2 PNA. Appetite ok, slowly improving. Watches what he eats. No fever or chills. Does not weight daily.  Taking all medications.   Device interrogation today: AP 25%, BP 90%, AT/AF burden 8.1*, Corvue up to 17. Impedance trending down. (Personally reviewed)    PMH: 1. Chronic LBBB 2. Chronic systolic CHF: Cardiomyopathy dating from as early as 2002. St Jude CRT-P device 2019.  - LHC (2002): Nonobstructive CAD. - Echo (4/23): EF 40-45% - Echo (12/24): EF 40-45%, mild RV dysfunction.  - Cardiolite (8/25): EF 29%,  partially reversible inferior defect.  - Echo (9/25): EF 25-30%, moderate RV dysfunction, PASP 41 mmHg.  - LHC/RHC (10/25): Nonobstructive CAD; mean RA 5, PA 37/15 mean 23, mean PCWP 9, CI 2.61, PVR 2.9 WU, PAPi 4.4.  3. 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  - St Jude CRT-P device placed in 2019.  4. Asthma 5. GERD 6. Type 2 diabetes  SH: Married, retired SCIENTIST, CLINICAL (HISTOCOMPATIBILITY AND IMMUNOGENETICS), lives in [place], nonsmoker, occasional ETOH (not heavy), no drugs.   Family History  Problem Relation Age of Onset   Heart attack Father        30s   Colon polyps Father    Heart failure Mother        90s   Subarachnoid hemorrhage Mother 93   Hypertension Mother    Subarachnoid hemorrhage Paternal Grandfather 49   Diabetes Maternal Aunt    Colon cancer Neg Hx    ROS: All systems reviewed and negative except as per HPI.   Current Outpatient Medications  Medication Sig Dispense Refill   albuterol  (PROVENTIL ) (2.5 MG/3ML) 0.083% nebulizer solution Take 3 mLs (2.5 mg total) by nebulization every 6 (six) hours as needed for wheezing or shortness of breath. 150 mL 5   albuterol  (VENTOLIN  HFA) 108 (90 Base) MCG/ACT inhaler TAKE 2 PUFFS BY MOUTH EVERY 6 HOURS AS NEEDED FOR WHEEZE OR SHORTNESS OF BREATH 6.7 each 11   amoxicillin -clavulanate (AUGMENTIN ) 400-57 MG/5ML suspension Take 10.9 mLs (875 mg total) by mouth 2 (two) times daily  for 42 days.  Will need to refill every 9 days 1000 mL 0   apixaban  (ELIQUIS ) 5 MG TABS tablet Take 1 tablet (5 mg total) by mouth 2 (two) times daily. 180 tablet 3   bisoprolol  (ZEBETA ) 5 MG tablet Take 0.5 tablets (2.5 mg total) by mouth daily. 15 tablet 3   Continuous Glucose Sensor (FREESTYLE LIBRE 14 DAY SENSOR) MISC SMARTSIG:1 Topical Every 2 Weeks     dapagliflozin  propanediol (FARXIGA ) 10 MG TABS tablet Take 1 tablet (10 mg total) by mouth daily before breakfast. 90 tablet 3   fluticasone  (FLONASE ) 50 MCG/ACT nasal spray Place 2 sprays into both nostrils daily. (Patient not  taking: Reported on 12/10/2024) 16 g 6   fluticasone -salmeterol (ADVAIR HFA) 230-21 MCG/ACT inhaler Inhale 2 puffs into the lungs 2 (two) times daily.     furosemide  (LASIX ) 40 MG tablet Take 0.5 tablets (20 mg total) by mouth daily. 90 tablet 3   glipiZIDE  (GLUCOTROL ) 5 MG tablet TAKE 1-2 TABLETS (5-10 MG TOTAL) BY MOUTH 2 (TWO) TIMES DAILY BEFORE A MEAL. (Patient taking differently: Take 5-10 mg by mouth as needed.) 180 tablet 1   HYDROcodone -acetaminophen  (NORCO) 7.5-325 MG tablet Take 1 tablet by mouth every 6 (six) hours as needed for severe pain (pain score 7-10) (for chronic joint pain, neck pain). (Patient taking differently: Take 1 tablet by mouth as needed for severe pain (pain score 7-10) (for chronic joint pain, neck pain).) 90 tablet 0   ipratropium (ATROVENT ) 0.02 % nebulizer solution TAKE 2.5 ML (0.5 MG TOTAL) BY NEBULIZATION 4 TIMES A DAY (Patient taking differently: Take 0.5 mg by nebulization as needed for shortness of breath or wheezing.) 125 mL 3   Lactobacillus (ACIDOPHILUS) 100 MG CAPS Take 1 capsule (100 mg total) by mouth 3 (three) times daily with meals. 126 capsule 0   levocetirizine (XYZAL ) 5 MG tablet Take 1 tablet (5 mg total) by mouth every evening. 30 tablet 2   LORazepam  (ATIVAN ) 1 MG tablet TAKE 1/2 TO 1 TABLET BY MOUTH NIGHTLY AS NEEDED. 30 tablet 0   metFORMIN  (GLUCOPHAGE ) 500 MG tablet TAKE 1 TABLET BY MOUTH TWICE A DAY WITH FOOD 180 tablet 1   mirtazapine  (REMERON ) 7.5 MG tablet TAKE 1-2 TABLETS (7.5-15 MG TOTAL) BY MOUTH AT BEDTIME. (Patient taking differently: Take 15 mg by mouth at bedtime.) 180 tablet 2   montelukast  (SINGULAIR ) 10 MG tablet Take 1 tablet (10 mg total) by mouth at bedtime. 100 tablet 3   nitroGLYCERIN  (NITROSTAT ) 0.4 MG SL tablet Place 1 tablet (0.4 mg total) under the tongue every 5 (five) minutes x 3 doses as needed for chest pain. 75 tablet 1   nystatin  (MYCOSTATIN ) 100000 UNIT/ML suspension Take 5 mLs (500,000 Units total) by mouth 4 (four)  times daily. Swish and spit.  Use for 10 days (Patient taking differently: Take 5 mLs by mouth as needed. Swish and spit.  Use for 10 days) 200 mL 0   potassium chloride  20 MEQ TBCR Take 0.5 tablets (10 mEq total) by mouth daily. 90 tablet 3   rosuvastatin  (CRESTOR ) 10 MG tablet TAKE 1 TABLET ON MONDAY, WEDNESDAY & FRIDAY. 45 tablet 3   spironolactone  (ALDACTONE ) 25 MG tablet Take 1 tablet (25 mg total) by mouth daily. 90 tablet 3   vitamin E 180 MG (400 UNITS) capsule Take 400 Units by mouth daily. (Patient not taking: Reported on 12/10/2024)     No current facility-administered medications for this visit.   There were no vitals taken  for this visit. General:  elderly appearing.  No respiratory difficulty. Arrived in [facility].  Neck: JVD does not appear elevated.  Cor: Regular rate & rhythm. No murmurs. Lungs: diminished, crackles at bases Extremities: +1-2 BLE edema. + TED hose Neuro: alert & oriented x 3. Affect pleasant.   Wt Readings from Last 3 Encounters:  12/10/24 60.1 kg (132 lb 6.4 oz)  11/30/24 62.6 kg (138 lb)  11/21/24 69.6 kg (153 lb 6.4 oz)    EKG V paced 72 bpm with PVC (Personally reviewed)    Assessment/Plan: 1. Chronic systolic CHF: Cardiomyopathy of uncertain etiology. H/o LBBB. He has had a mild cardiomyopathy with EF 40-45% documented for > 20 years.  LHC in 2002 showed nonobstructive CAD.  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  Worsened symptoms summer 2025, he was started on Lasix .  Cardiolite in 8/25 showed EF 29%, partially reversible inferior perfusion defect.  Echo in 9/25 showed EF 25-30%, moderate RV dysfunction, PASP 41 mmHg.  He has been BiV pacing appropriately. LHC/RHC in 10/25 showed no significant CAD, normal filling pressures, CI 2.61. Echo 9/25: EF 25-30%, LV with GHK, RV mod reduced, LA/RA mildly dilated, mild MR - NYHA class II-IIIa, limited by recent PNA - Increase Lasix  20>40 mg daily x1 week then back  to 20 mg. Increase KDUR to 20 mEq daily for 1 week.  - Continue Farxiga  10 mg daily.  - Continue bisoprolol  2.5 daily.  - Continue spironolactone  25 mg daily  - Now off Valsartan  with soft BP.  - Labs reviewed from discharge 2 days ago, stable.  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  Would be reasonable to offer genetic testing.  Previously deferred.  - Cardiac MRI has been scheduled for later this month to look for prior myocarditis, infiltrative disease, etc.  - If EF does not improve with medical management, can consider ICD though he is of advanced age.  Would probably not recommend unless he has a lot of delayed enhancement on cardiac MRI. Will rescheuled cMRI. No showed last month 2/2 being admitted. # provided today to reschedule.   2. Type 2 2nd degree AVB: Has St Jude CRT-P device.  As above, with combination of heart block and cardiomyopathy, consider Lamin, TTN, and SCN5A cardiomyopathies. 3. History of atrial flutter - not on A/C. - during admission tele showed paced rhythm.  - EKG today V paced with PVC - Continue bisoprolol   - Has upcoming f/u with a fib clinic.   Follow up in 4 months with Dr. Rolan or APP.   Jeffery [name] AGACNP-BC  12/12/2024   "

## 2024-12-12 NOTE — Telephone Encounter (Signed)
 Called to confirm/remind patient of their appointment at the [facility] on 12/14/24.   Appointment:   [x] Confirmed  [] Left mess   [] No answer/No voice mail  [] VM Full/unable to leave message  [] Phone not in service  Patient reminded to bring all medications and/or complete list.  Confirmed patient has transportation. Gave directions, instructed to utilize valet parking.

## 2024-12-14 ENCOUNTER — Ambulatory Visit (HOSPITAL_COMMUNITY)

## 2024-12-19 ENCOUNTER — Encounter: Payer: Self-pay | Admitting: Cardiovascular Disease

## 2024-12-21 ENCOUNTER — Telehealth: Payer: Self-pay

## 2024-12-21 NOTE — Telephone Encounter (Signed)
 Copied from CRM [id number]. Topic: Clinical - Home Health Verbal Orders >> Dec 21, 2024  3:16 PM Viola F wrote: Caller/Agency: Landry from [facility]  Callback Number: [phone] Service Requested: Skilled Nursing Frequency: extension of nursing order for an additional week  Any new concerns about the patient? No, but patient is losing weight

## 2024-12-21 NOTE — Telephone Encounter (Signed)
 Okay for orders?

## 2024-12-24 ENCOUNTER — Telehealth (HOSPITAL_COMMUNITY): Payer: Self-pay

## 2024-12-24 NOTE — Telephone Encounter (Signed)
 Called to confirm/remind patient of their appointment at the [facility] on 12/25/24.   Appointment:   [x] Confirmed  [] Left mess   [] No answer/No voice mail  [] VM Full/unable to leave message  [] Phone not in service  Patient reminded to bring all medications and/or complete list.  Confirmed patient has transportation. Gave directions, instructed to utilize valet parking.

## 2024-12-24 NOTE — Telephone Encounter (Signed)
 Verbals given today.

## 2024-12-25 ENCOUNTER — Encounter (HOSPITAL_COMMUNITY): Payer: Self-pay

## 2024-12-25 ENCOUNTER — Ambulatory Visit (HOSPITAL_COMMUNITY)
Admission: RE | Admit: 2024-12-25 | Discharge: 2024-12-25 | Disposition: A | Source: Ambulatory Visit | Attending: Family Medicine

## 2024-12-25 ENCOUNTER — Ambulatory Visit (HOSPITAL_COMMUNITY): Payer: Self-pay | Admitting: Family Medicine

## 2024-12-25 VITALS — BP 100/56 | HR 71 | Wt 133.6 lb

## 2024-12-25 DIAGNOSIS — Z7984 Long term (current) use of oral hypoglycemic drugs: Secondary | ICD-10-CM | POA: Insufficient documentation

## 2024-12-25 DIAGNOSIS — Z8679 Personal history of other diseases of the circulatory system: Secondary | ICD-10-CM | POA: Diagnosis not present

## 2024-12-25 DIAGNOSIS — I2510 Atherosclerotic heart disease of native coronary artery without angina pectoris: Secondary | ICD-10-CM | POA: Diagnosis not present

## 2024-12-25 DIAGNOSIS — I5022 Chronic systolic (congestive) heart failure: Secondary | ICD-10-CM | POA: Diagnosis not present

## 2024-12-25 DIAGNOSIS — Z7901 Long term (current) use of anticoagulants: Secondary | ICD-10-CM | POA: Diagnosis not present

## 2024-12-25 DIAGNOSIS — I4891 Unspecified atrial fibrillation: Secondary | ICD-10-CM

## 2024-12-25 DIAGNOSIS — Z79899 Other long term (current) drug therapy: Secondary | ICD-10-CM | POA: Diagnosis not present

## 2024-12-25 DIAGNOSIS — I429 Cardiomyopathy, unspecified: Secondary | ICD-10-CM | POA: Diagnosis not present

## 2024-12-25 DIAGNOSIS — E119 Type 2 diabetes mellitus without complications: Secondary | ICD-10-CM | POA: Diagnosis not present

## 2024-12-25 DIAGNOSIS — I441 Atrioventricular block, second degree: Secondary | ICD-10-CM | POA: Diagnosis not present

## 2024-12-25 LAB — CBC
HCT: 42.2 % (ref 39.0–52.0)
Hemoglobin: 13.3 g/dL (ref 13.0–17.0)
MCH: 26.4 pg (ref 26.0–34.0)
MCHC: 31.5 g/dL (ref 30.0–36.0)
MCV: 83.7 fL (ref 80.0–100.0)
Platelets: 442 K/uL — ABNORMAL HIGH (ref 150–400)
RBC: 5.04 MIL/uL (ref 4.22–5.81)
RDW: 18 % — ABNORMAL HIGH (ref 11.5–15.5)
WBC: 11.8 K/uL — ABNORMAL HIGH (ref 4.0–10.5)
nRBC: 0 % (ref 0.0–0.2)

## 2024-12-25 LAB — BASIC METABOLIC PANEL WITH GFR
Anion gap: 10 (ref 5–15)
BUN: 53 mg/dL — ABNORMAL HIGH (ref 8–23)
CO2: 30 mmol/L (ref 22–32)
Calcium: 10.2 mg/dL (ref 8.9–10.3)
Chloride: 97 mmol/L — ABNORMAL LOW (ref 98–111)
Creatinine, Ser: 0.88 mg/dL (ref 0.61–1.24)
GFR, Estimated: >60 mL/min
Glucose, Bld: 114 mg/dL — ABNORMAL HIGH (ref 70–99)
Potassium: 5.7 mmol/L — ABNORMAL HIGH (ref 3.5–5.1)
Sodium: 136 mmol/L (ref 135–145)

## 2024-12-25 LAB — PRO BRAIN NATRIURETIC PEPTIDE: Pro Brain Natriuretic Peptide: 2823 pg/mL — ABNORMAL HIGH

## 2024-12-25 MED ORDER — POTASSIUM CHLORIDE CRYS ER 10 MEQ PO TBCR: 10.0000 meq | EXTENDED_RELEASE_TABLET | Freq: Two times a day (BID) | ORAL | 2 refills | Status: DC

## 2024-12-25 NOTE — Progress Notes (Signed)
 "  ADVANCED HEART FAILURE CLINIC    PCP: Geofm Glade PARAS, MD HF Cardiology: Dr. Rolan  HPI: Jeffery [name] is a 84 y.o. 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  Patient has had a long history of mild cardiomyopathy, EF in the 40-45% range, since around 2002. 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  St Jude CRT-P device placed.  Cath in 2002 showed mild nonobstructive CAD.  Echo in 4/23 and 12/24 showed EF 40-45%.  In the summer of 2025, he began to note increased exertional dyspnea.  He was SOB walking up 1/2 flight of stairs.  He was started on Lasix  20 mg daily later increased to 40 mg daily.  With the addition of Lasix , his breathing returned to baseline. Cardiolite in 8/25 showed partially reversible inferior defect with EF 29%.  To confirm the fall in EF, he had an echo done in 9/25 showing EF 25-30% with moderate RV dysfunction.    LHC/RHC in 10/25 showed nonobstructive CAD, normal filling pressures and CI 2.61.    Admitted 12/25 with LUL pneumonia / empyema. S/p thoracentesis 11/13/24. Required CT placement. Fluid cultures grew pansensitive strep.  Discharged with abx and pulmonary follow up.  Today he returns for HF follow up. Overall feeling better each day. He has mild SOB walking up large flights up steps, but feels breathing has improved. No cough. Denies palpitations, abnormal bleeding, CP, dizziness, edema, or PND/Orthopnea. Appetite ok. Weight at home 132 pounds. Taking all medications. Doing PT at home. Retired SCIENTIST, CLINICAL (HISTOCOMPATIBILITY AND IMMUNOGENETICS) at [employer].  Device interrogation (personally reviewed): CoreVue stable, thoracic impedence stable, 94% BV, 100% AF   Labs (9/25): K 3.9, creatinine 1.07 Labs (10/25): K 4.4, creatinine 1.09 Labs (12/25): K 3.5, creatinine 0.74  PMH: 1. Chronic LBBB 2. Chronic systolic CHF: Cardiomyopathy dating from as early as 2002. St Jude CRT-P device 2019.  - LHC (2002): Nonobstructive CAD. - Echo (4/23): EF  40-45% - Echo (12/24): EF 40-45%, mild RV dysfunction.  - Cardiolite (8/25): EF 29%, partially reversible inferior defect.  - Echo (9/25): EF 25-30%, moderate RV dysfunction, PASP 41 mmHg.  - LHC/RHC (10/25): Nonobstructive CAD; mean RA 5, PA 37/15 mean 23, mean PCWP 9, CI 2.61, PVR 2.9 WU, PAPi 4.4.  3. 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  - St Jude CRT-P device placed in 2019.  4. Asthma 5. GERD 6. Type 2 diabetes  SH: Married, retired SCIENTIST, CLINICAL (HISTOCOMPATIBILITY AND IMMUNOGENETICS), lives in [place], nonsmoker, occasional ETOH (not heavy), no drugs.   Family History  Problem Relation Age of Onset   Heart attack Father        39s   Colon polyps Father    Heart failure Mother        90s   Subarachnoid hemorrhage Mother 28   Hypertension Mother    Subarachnoid hemorrhage Paternal Grandfather 80   Diabetes Maternal Aunt    Colon cancer Neg Hx    ROS: All systems reviewed and negative except as per HPI.   Current Outpatient Medications  Medication Sig Dispense Refill   albuterol  (PROVENTIL ) (2.5 MG/3ML) 0.083% nebulizer solution Take 3 mLs (2.5 mg total) by nebulization every 6 (six) hours as needed for wheezing or shortness of breath. 150 mL 5   albuterol  (VENTOLIN  HFA) 108 (90 Base) MCG/ACT inhaler TAKE 2 PUFFS BY MOUTH EVERY 6 HOURS AS NEEDED FOR WHEEZE OR SHORTNESS OF BREATH 6.7 each 11   apixaban  (ELIQUIS ) 5 MG  TABS tablet Take 1 tablet (5 mg total) by mouth 2 (two) times daily. 180 tablet 3   bisoprolol  (ZEBETA ) 5 MG tablet Take 0.5 tablets (2.5 mg total) by mouth daily. 15 tablet 3   Continuous Glucose Sensor (FREESTYLE LIBRE 14 DAY SENSOR) MISC SMARTSIG:1 Topical Every 2 Weeks     dapagliflozin  propanediol (FARXIGA ) 10 MG TABS tablet Take 1 tablet (10 mg total) by mouth daily before breakfast. 90 tablet 3   fluticasone  (FLONASE ) 50 MCG/ACT nasal spray Place 2 sprays into both nostrils daily. 16 g 6   fluticasone -salmeterol (ADVAIR HFA) 230-21 MCG/ACT inhaler Inhale 2 puffs into the lungs 2 (two)  times daily.     furosemide  (LASIX ) 40 MG tablet Take 0.5 tablets (20 mg total) by mouth daily. 90 tablet 3   glipiZIDE  (GLUCOTROL ) 5 MG tablet TAKE 1-2 TABLETS (5-10 MG TOTAL) BY MOUTH 2 (TWO) TIMES DAILY BEFORE A MEAL. (Patient taking differently: Take 5-10 mg by mouth as needed.) 180 tablet 1   HYDROcodone -acetaminophen  (NORCO) 7.5-325 MG tablet Take 1 tablet by mouth every 6 (six) hours as needed for severe pain (pain score 7-10) (for chronic joint pain, neck pain). (Patient taking differently: Take 1 tablet by mouth as needed for severe pain (pain score 7-10) (for chronic joint pain, neck pain).) 90 tablet 0   ipratropium (ATROVENT ) 0.02 % nebulizer solution TAKE 2.5 ML (0.5 MG TOTAL) BY NEBULIZATION 4 TIMES A DAY (Patient taking differently: Take 0.5 mg by nebulization as needed for shortness of breath or wheezing.) 125 mL 3   Lactobacillus (ACIDOPHILUS) 100 MG CAPS Take 1 capsule (100 mg total) by mouth 3 (three) times daily with meals. 126 capsule 0   levocetirizine (XYZAL ) 5 MG tablet Take 1 tablet (5 mg total) by mouth every evening. 30 tablet 2   LORazepam  (ATIVAN ) 1 MG tablet TAKE 1/2 TO 1 TABLET BY MOUTH NIGHTLY AS NEEDED. 30 tablet 0   metFORMIN  (GLUCOPHAGE ) 500 MG tablet TAKE 1 TABLET BY MOUTH TWICE A DAY WITH FOOD 180 tablet 1   mirtazapine  (REMERON ) 7.5 MG tablet TAKE 1-2 TABLETS (7.5-15 MG TOTAL) BY MOUTH AT BEDTIME. (Patient taking differently: Take 15 mg by mouth at bedtime.) 180 tablet 2   montelukast  (SINGULAIR ) 10 MG tablet Take 1 tablet (10 mg total) by mouth at bedtime. 100 tablet 3   nitroGLYCERIN  (NITROSTAT ) 0.4 MG SL tablet Place 1 tablet (0.4 mg total) under the tongue every 5 (five) minutes x 3 doses as needed for chest pain. 75 tablet 1   nystatin  (MYCOSTATIN ) 100000 UNIT/ML suspension Take 5 mLs (500,000 Units total) by mouth 4 (four) times daily. Swish and spit.  Use for 10 days (Patient taking differently: Take 5 mLs by mouth as needed. Swish and spit.  Use for 10 days)  200 mL 0   potassium chloride  20 MEQ TBCR Take 0.5 tablets (10 mEq total) by mouth daily. 90 tablet 3   rosuvastatin  (CRESTOR ) 10 MG tablet TAKE 1 TABLET ON MONDAY, WEDNESDAY & FRIDAY. 45 tablet 3   spironolactone  (ALDACTONE ) 25 MG tablet Take 1 tablet (25 mg total) by mouth daily. 90 tablet 3   vitamin E 180 MG (400 UNITS) capsule Take 400 Units by mouth daily.     amoxicillin -clavulanate (AUGMENTIN ) 400-57 MG/5ML suspension Take 10.9 mLs (875 mg total) by mouth 2 (two) times daily for 42 days.  Will need to refill every 9 days (Patient not taking: Reported on 12/25/2024) 1000 mL 0   No current facility-administered medications for this encounter.  Wt Readings from Last 3 Encounters:  12/25/24 60.6 kg (133 lb 9.6 oz)  12/10/24 60.1 kg (132 lb 6.4 oz)  11/30/24 62.6 kg (138 lb)   BP (!) 100/56   Pulse 71   Wt 60.6 kg (133 lb 9.6 oz)   SpO2 99%   BMI 19.73 kg/m   Physical Exam General:  NAD. No resp difficulty, walked into clinic, thin HEENT: Normal Neck: Supple. No JVD. Cor: irregular rate & rhythm. No rubs, gallops or murmurs. Lungs: Clear, faint crackles LLL Abdomen: Soft, nontender, nondistended.  Extremities: No cyanosis, clubbing, rash, edema Neuro: Alert & oriented x 3, moves all 4 extremities w/o difficulty. Affect pleasant.  Assessment/Plan: 1. Chronic systolic CHF: Cardiomyopathy of uncertain etiology. H/o LBBB. He has had a mild cardiomyopathy with EF 40-45% documented for > 20 years.  LHC in 2002 showed nonobstructive CAD.  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  Worsened symptoms summer 2025, he was started on Lasix .  Cardiolite in 8/25 showed EF 29%, partially reversible inferior perfusion defect.  Echo in 9/25 showed EF 25-30%, moderate RV dysfunction, PASP 41 mmHg.  He has been BiV pacing appropriately. LHC/RHC in 10/25 showed no significant CAD, normal filling pressures, CI 2.61. Echo 9/25: EF 25-30%, LV with GHK, RV  mod reduced, LA/RA mildly dilated, mild MR. NYHA class II-IIIa, he is not volume overloaded by exam. GDMT limited by low BP.  - Continue Lasix  20 mg daily + 10 KCL daily. BMET and pro-BNP today. - Continue Farxiga  10 mg daily.  - Continue bisoprolol  2.5 mg daily.  - Continue spironolactone  25 mg daily  - Off valsartan  with soft BP.  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  Would be reasonable to offer genetic testing. He has previously deferred.  - Cardiac MRI has been scheduled to look for prior myocarditis, infiltrative disease, etc.  - If EF does not improve with medical management, can consider ICD though he is of advanced age.  Would probably not recommend unless he has a lot of delayed enhancement on cardiac MRI.   - Offered Cardiac Rehab today, he declines.  2. Type 2 2nd degree AVB: Has St Jude CRT-P device.  As above, with combination of heart block and cardiomyopathy, consider Lamin, TTN, and SCN5A cardiomyopathies. 3. AF: Recent diagnosis (11/2024). He is being followed by the AF clinic, considering DCCV. - Continue Eliquis  5 mg bid. No bleeding issues. CBC today. - Continue bisoprolol    Follow up in 3 months with Dr. Rolan + echo.  Jeffery [name] [facility] FNP-BC 12/25/2024 "

## 2024-12-25 NOTE — Telephone Encounter (Addendum)
 Pt aware, agreeable, and verbalized understanding Repeat labwork scheduled  ----- Message from Harlene CHRISTELLA Gainer sent at 12/25/2024  4:27 PM EST ----- K is 5.7  Stop KCL and spiro. Repeat BMET on Monday

## 2024-12-25 NOTE — Patient Instructions (Addendum)
 Good to see you today!   Labs done today, your results will be available in MyChart, we will contact you for abnormal readings.  Your physician has requested that you have a cardiac MRI. Cardiac MRI uses a computer to create images of your heart as its beating, producing both still and moving pictures of your heart and major blood vessels. For further information please visit [email or web address]. Please follow the instruction sheet given to you today for more information.  Your physician has requested that you have an echocardiogram. Echocardiography is a painless test that uses sound waves to create images of your heart. It provides your doctor with information about the size and shape of your heart and how well your hearts chambers and valves are working. This procedure takes approximately one hour. There are no restrictions for this procedure. Please do NOT wear cologne, perfume, aftershave, or lotions (deodorant is allowed). Please arrive 15 minutes prior to your appointment time.  Please note: We ask at that you not bring children with you during ultrasound (echo/ vascular) testing. Due to room size and safety concerns, children are not allowed in the ultrasound rooms during exams. Our front office staff cannot provide observation of children in our lobby area while testing is being conducted. An adult accompanying a patient to their appointment will only be allowed in the ultrasound room at the discretion of the ultrasound technician under special circumstances. We apologize for any inconvenience.   Your physician recommends that you schedule a follow-up appointment 3 months with echocardiogram (April) Call office in February to schedule an appointment  If you have any questions or concerns before your next appointment please send us  a message through [place] or call our office at [phone].    TO LEAVE A MESSAGE FOR THE NURSE SELECT OPTION 2, PLEASE LEAVE A MESSAGE INCLUDING: YOUR  NAME DATE OF BIRTH CALL BACK NUMBER REASON FOR CALL**this is important as we prioritize the call backs  YOU WILL RECEIVE A CALL BACK THE SAME DAY AS LONG AS YOU CALL BEFORE 4:00 PM At the [facility], you and your health needs are our priority. As part of our continuing mission to provide you with exceptional heart care, we have created designated Provider Care Teams. These Care Teams include your primary Cardiologist (physician) and Advanced Practice Providers (APPs- Physician Assistants and Nurse Practitioners) who all work together to provide you with the care you need, when you need it.   You may see any of the following providers on your designated Care Team at your next follow up: Dr Toribio Fuel Dr Ezra Shuck Dr. Morene Brownie Greig Mosses, NP Caffie Shed, [place] [facility] [place], [place] Beckey Coe, NP Jordan Lee, NP Ellouise Class, NP Tinnie Redman, PharmD Jaun Bash, PharmD   Please be sure to bring in all your medications bottles to every appointment.    Thank you for choosing [facility]

## 2024-12-28 ENCOUNTER — Ambulatory Visit (HOSPITAL_COMMUNITY): Admitting: Internal Medicine

## 2024-12-31 ENCOUNTER — Ambulatory Visit (HOSPITAL_COMMUNITY)
Admission: RE | Admit: 2024-12-31 | Discharge: 2024-12-31 | Disposition: A | Source: Ambulatory Visit | Attending: Cardiology

## 2024-12-31 ENCOUNTER — Encounter (HOSPITAL_COMMUNITY): Payer: Self-pay | Admitting: Internal Medicine

## 2024-12-31 ENCOUNTER — Ambulatory Visit (HOSPITAL_COMMUNITY)
Admission: RE | Admit: 2024-12-31 | Discharge: 2024-12-31 | Disposition: A | Discharge status: Home / Self Care | Source: Ambulatory Visit | Attending: Internal Medicine | Admitting: Internal Medicine

## 2024-12-31 ENCOUNTER — Encounter: Payer: Self-pay | Admitting: Internal Medicine

## 2024-12-31 VITALS — BP 98/70 | HR 71 | Ht 69.0 in | Wt 136.6 lb

## 2024-12-31 DIAGNOSIS — I493 Ventricular premature depolarization: Secondary | ICD-10-CM | POA: Insufficient documentation

## 2024-12-31 DIAGNOSIS — D6869 Other thrombophilia: Secondary | ICD-10-CM | POA: Diagnosis not present

## 2024-12-31 DIAGNOSIS — I5022 Chronic systolic (congestive) heart failure: Secondary | ICD-10-CM | POA: Insufficient documentation

## 2024-12-31 DIAGNOSIS — Z79899 Other long term (current) drug therapy: Secondary | ICD-10-CM | POA: Diagnosis not present

## 2024-12-31 DIAGNOSIS — Z7984 Long term (current) use of oral hypoglycemic drugs: Secondary | ICD-10-CM | POA: Insufficient documentation

## 2024-12-31 DIAGNOSIS — I110 Hypertensive heart disease with heart failure: Secondary | ICD-10-CM | POA: Insufficient documentation

## 2024-12-31 DIAGNOSIS — R9431 Abnormal electrocardiogram [ECG] [EKG]: Secondary | ICD-10-CM | POA: Insufficient documentation

## 2024-12-31 DIAGNOSIS — Z7901 Long term (current) use of anticoagulants: Secondary | ICD-10-CM | POA: Diagnosis not present

## 2024-12-31 DIAGNOSIS — I4892 Unspecified atrial flutter: Secondary | ICD-10-CM | POA: Diagnosis not present

## 2024-12-31 DIAGNOSIS — E119 Type 2 diabetes mellitus without complications: Secondary | ICD-10-CM | POA: Diagnosis not present

## 2024-12-31 DIAGNOSIS — E785 Hyperlipidemia, unspecified: Secondary | ICD-10-CM | POA: Insufficient documentation

## 2024-12-31 DIAGNOSIS — I4819 Other persistent atrial fibrillation: Secondary | ICD-10-CM | POA: Insufficient documentation

## 2024-12-31 DIAGNOSIS — I2510 Atherosclerotic heart disease of native coronary artery without angina pectoris: Secondary | ICD-10-CM | POA: Insufficient documentation

## 2024-12-31 DIAGNOSIS — Z950 Presence of cardiac pacemaker: Secondary | ICD-10-CM | POA: Insufficient documentation

## 2024-12-31 LAB — BASIC METABOLIC PANEL WITH GFR
Anion gap: 10 (ref 5–15)
BUN: 39 mg/dL — ABNORMAL HIGH (ref 8–23)
CO2: 29 mmol/L (ref 22–32)
Calcium: 9.8 mg/dL (ref 8.9–10.3)
Chloride: 101 mmol/L (ref 98–111)
Creatinine, Ser: 0.81 mg/dL (ref 0.61–1.24)
GFR, Estimated: >60 mL/min
Glucose, Bld: 86 mg/dL (ref 70–99)
Potassium: 4.7 mmol/L (ref 3.5–5.1)
Sodium: 139 mmol/L (ref 135–145)

## 2024-12-31 MED ORDER — HYDROCODONE-ACETAMINOPHEN 7.5-325 MG PO TABS: 1.0000 | ORAL_TABLET | Freq: Four times a day (QID) | ORAL | 0 refills | Status: AC | PRN

## 2024-12-31 NOTE — Progress Notes (Signed)
 "   Primary Care Physician: Geofm Glade PARAS, MD Primary Cardiologist: None Electrophysiologist: Danelle Birmingham, MD     Referring Physician: Device clinic     Jeffery [name] is a 84 y.o. male with a history of chronic systolic CHF, T2DM, Stokes-Adams syncope s/p PPM, HTN, hyperlipidemia, CAD, and persistent atrial fibrillation who presents for consultation in the [facility].  Device clinic alerted on 12/18 for persistent A-fib with controlled rates.  Patient contacted by Dr. Almetta on 12/26 and aspirin  stopped and Eliquis  started.  Patient is on Eliquis  for stroke prevention.  On evaluation today, patient is currently in V paced rhythm.  He missed this morning's dose of Eliquis  due to accidental delay of device clinic visit.  He does not appear to have cardiac awareness of arrhythmia.  No bleeding issues on Eliquis .  Follow up 12/31/24. Patient is currently in V paced rhythm. He has not missed any doses of Eliquis  since last office visit.   Today, he denies symptoms of palpitations, chest pain, shortness of breath, orthopnea, PND, lower extremity edema, dizziness, presyncope, syncope, bleeding, or neurologic sequela. The patient is tolerating medications without difficulties and is otherwise without complaint today.   he has a BMI of Body mass index is 20.17 kg/m.SABRA Filed Weights   12/31/24 1437  Weight: 62 kg     Current Outpatient Medications  Medication Sig Dispense Refill   albuterol  (PROVENTIL ) (2.5 MG/3ML) 0.083% nebulizer solution Take 3 mLs (2.5 mg total) by nebulization every 6 (six) hours as needed for wheezing or shortness of breath. 150 mL 5   albuterol  (VENTOLIN  HFA) 108 (90 Base) MCG/ACT inhaler TAKE 2 PUFFS BY MOUTH EVERY 6 HOURS AS NEEDED FOR WHEEZE OR SHORTNESS OF BREATH 6.7 each 11   apixaban  (ELIQUIS ) 5 MG TABS tablet Take 1 tablet (5 mg total) by mouth 2 (two) times daily. 180 tablet 3   bisoprolol  (ZEBETA ) 5 MG tablet Take 0.5 tablets  (2.5 mg total) by mouth daily. 15 tablet 3   Continuous Glucose Sensor (FREESTYLE LIBRE 14 DAY SENSOR) MISC SMARTSIG:1 Topical Every 2 Weeks     dapagliflozin  propanediol (FARXIGA ) 10 MG TABS tablet Take 1 tablet (10 mg total) by mouth daily before breakfast. 90 tablet 3   fluticasone  (FLONASE ) 50 MCG/ACT nasal spray Place 2 sprays into both nostrils daily. 16 g 6   fluticasone -salmeterol (ADVAIR HFA) 230-21 MCG/ACT inhaler Inhale 2 puffs into the lungs 2 (two) times daily.     furosemide  (LASIX ) 40 MG tablet Take 0.5 tablets (20 mg total) by mouth daily. 90 tablet 3   glipiZIDE  (GLUCOTROL ) 5 MG tablet TAKE 1-2 TABLETS (5-10 MG TOTAL) BY MOUTH 2 (TWO) TIMES DAILY BEFORE A MEAL. (Patient taking differently: Take 5-10 mg by mouth as needed.) 180 tablet 1   HYDROcodone -acetaminophen  (NORCO) 7.5-325 MG tablet Take 1 tablet by mouth every 6 (six) hours as needed for severe pain (pain score 7-10) (for chronic joint pain, neck pain). 90 tablet 0   ipratropium (ATROVENT ) 0.02 % nebulizer solution TAKE 2.5 ML (0.5 MG TOTAL) BY NEBULIZATION 4 TIMES A DAY (Patient taking differently: Take 0.5 mg by nebulization as needed for shortness of breath or wheezing.) 125 mL 3   Lactobacillus (ACIDOPHILUS) 100 MG CAPS Take 1 capsule (100 mg total) by mouth 3 (three) times daily with meals. 126 capsule 0   levocetirizine (XYZAL ) 5 MG tablet Take 1 tablet (5 mg total) by mouth every evening. 30 tablet 2   LORazepam  (ATIVAN ) 1 MG  tablet TAKE 1/2 TO 1 TABLET BY MOUTH NIGHTLY AS NEEDED. 30 tablet 0   metFORMIN  (GLUCOPHAGE ) 500 MG tablet TAKE 1 TABLET BY MOUTH TWICE A DAY WITH FOOD 180 tablet 1   mirtazapine  (REMERON ) 7.5 MG tablet TAKE 1-2 TABLETS (7.5-15 MG TOTAL) BY MOUTH AT BEDTIME. (Patient taking differently: Take 15 mg by mouth at bedtime.) 180 tablet 2   montelukast  (SINGULAIR ) 10 MG tablet Take 1 tablet (10 mg total) by mouth at bedtime. 100 tablet 3   nitroGLYCERIN  (NITROSTAT ) 0.4 MG SL tablet Place 1 tablet (0.4 mg  total) under the tongue every 5 (five) minutes x 3 doses as needed for chest pain. 75 tablet 1   nystatin  (MYCOSTATIN ) 100000 UNIT/ML suspension Take 5 mLs (500,000 Units total) by mouth 4 (four) times daily. Swish and spit.  Use for 10 days (Patient taking differently: Take 5 mLs by mouth as needed. Swish and spit.  Use for 10 days) 200 mL 0   potassium chloride  (KLOR-CON  M) 10 MEQ tablet Take 10 mEq by mouth 2 (two) times daily.     rosuvastatin  (CRESTOR ) 10 MG tablet TAKE 1 TABLET ON MONDAY, WEDNESDAY & FRIDAY. 45 tablet 3   vitamin E 180 MG (400 UNITS) capsule Take 400 Units by mouth daily.     No current facility-administered medications for this encounter.    Atrial Fibrillation Management history:  Previous antiarrhythmic drugs: none Previous cardioversions: none Previous ablations: none Anticoagulation history: Eliquis    ROS- All systems are reviewed and negative except as per the HPI above.  Physical Exam: BP 98/70   Pulse 71   Ht 5' 9 (1.753 m)   Wt 62 kg   BMI 20.17 kg/m   GEN- The patient is well appearing, alert and oriented x 3 today.   Neck - no JVD or carotid bruit noted Lungs- Clear to ausculation bilaterally, normal work of breathing Heart- Regular rate (V paced), no murmurs, rubs or gallops, PMI not laterally displaced Extremities- no clubbing, cyanosis, or edema Skin - no rash or ecchymosis noted   EKG today demonstrates  EKG Interpretation Date/Time:  Monday December 31 2024 14:41:01 EST Ventricular Rate:  71 PR Interval:    QRS Duration:  158 QT Interval:  474 QTC Calculation: 515 R Axis:   191  Text Interpretation: Ventricular-paced rhythm with premature ventricular or aberrantly conducted complexes Abnormal ECG When compared with ECG of 10-Dec-2024 11:46, No significant change was found Confirmed by Terra Pac [phone]) on 12/31/2024 3:06:37 PM     Echo 08/23/24 demonstrated   1. Left ventricular ejection fraction, by estimation, is 25 to 30%.  The  left ventricle has severely decreased function. The left ventricle  demonstrates global hypokinesis. There is mild concentric left ventricular  hypertrophy. Indeterminate diastolic  filling due to E-A fusion.   2. Right ventricular systolic function is moderately reduced. The right  ventricular size is moderately enlarged. There is mildly elevated  pulmonary artery systolic pressure. The estimated right ventricular  systolic pressure is 40.9 mmHg.   3. Left atrial size was mildly dilated.   4. Right atrial size was mildly dilated.   5. The mitral valve is grossly normal. Mild mitral valve regurgitation.  No evidence of mitral stenosis.   6. The aortic valve is tricuspid. Aortic valve regurgitation is not  visualized. No aortic stenosis is present.   7. The inferior vena cava is normal in size with greater than 50%  respiratory variability, suggesting right atrial pressure of 3 mmHg.  ASSESSMENT & PLAN CHA2DS2-VASc Score = 6  The patient's score is based upon: CHF History: 1 HTN History: 1 Diabetes History: 1 Stroke History: 0 Vascular Disease History: 1 Age Score: 2 Gender Score: 0       ASSESSMENT AND PLAN: Persistent Atrial flutter (ICD10:  I48.19) The patient's CHA2DS2-VASc score is 6, indicating a 9.7% annual risk of stroke.    Patient is currently in V paced rhythm. Continue bisoprolol  2.5 mg daily. We discussed the procedure cardioversion to try to convert to NSR. We discussed the risks vs benefits of this procedure and how ultimately we cannot predict whether a patient will have early return of arrhythmia post procedure. After discussion, the patient wishes to proceed with cardioversion. Labs drawn on 12/25/24 and Bmet rechecked today showed normal potassium. He will have to hold Farxiga  3 days prior to cardioversion. No missed doses of DOAC. We did briefly discuss rhythm control options such as AAD therapy or ablation if he has ERAF.  Informed Consent   Shared  Decision Making/Informed Consent The risks (stroke, cardiac arrhythmias rarely resulting in the need for a temporary or permanent pacemaker, skin irritation or burns and complications associated with conscious sedation including aspiration, arrhythmia, respiratory failure and death), benefits (restoration of normal sinus rhythm) and alternatives of a direct current cardioversion were explained in detail to Mr. Finkbiner and he agrees to proceed.       Secondary Hypercoagulable State (ICD10:  D68.69) The patient is at significant risk for stroke/thromboembolism based upon his CHA2DS2-VASc Score of 6.  Continue Apixaban  (Eliquis ).  No missed doses of Eliquis  since last office visit.     Follow up 2 weeks after DCCV.    Terra Pac, [facility]  Afib Clinic [address] [place], [place] [address] [phone]  "

## 2024-12-31 NOTE — H&P (View-Only) (Signed)
 "   Primary Care Physician: Geofm Glade PARAS, MD Primary Cardiologist: None Electrophysiologist: Danelle Birmingham, MD     Referring Physician: Device clinic     Jeffery [name] is a 84 y.o. male with a history of chronic systolic CHF, T2DM, Stokes-Adams syncope s/p PPM, HTN, hyperlipidemia, CAD, and persistent atrial fibrillation who presents for consultation in the [facility].  Device clinic alerted on 12/18 for persistent A-fib with controlled rates.  Patient contacted by Dr. Almetta on 12/26 and aspirin  stopped and Eliquis  started.  Patient is on Eliquis  for stroke prevention.  On evaluation today, patient is currently in V paced rhythm.  He missed this morning's dose of Eliquis  due to accidental delay of device clinic visit.  He does not appear to have cardiac awareness of arrhythmia.  No bleeding issues on Eliquis .  Follow up 12/31/24. Patient is currently in V paced rhythm. He has not missed any doses of Eliquis  since last office visit.   Today, he denies symptoms of palpitations, chest pain, shortness of breath, orthopnea, PND, lower extremity edema, dizziness, presyncope, syncope, bleeding, or neurologic sequela. The patient is tolerating medications without difficulties and is otherwise without complaint today.   he has a BMI of Body mass index is 20.17 kg/m.SABRA Filed Weights   12/31/24 1437  Weight: 62 kg     Current Outpatient Medications  Medication Sig Dispense Refill   albuterol  (PROVENTIL ) (2.5 MG/3ML) 0.083% nebulizer solution Take 3 mLs (2.5 mg total) by nebulization every 6 (six) hours as needed for wheezing or shortness of breath. 150 mL 5   albuterol  (VENTOLIN  HFA) 108 (90 Base) MCG/ACT inhaler TAKE 2 PUFFS BY MOUTH EVERY 6 HOURS AS NEEDED FOR WHEEZE OR SHORTNESS OF BREATH 6.7 each 11   apixaban  (ELIQUIS ) 5 MG TABS tablet Take 1 tablet (5 mg total) by mouth 2 (two) times daily. 180 tablet 3   bisoprolol  (ZEBETA ) 5 MG tablet Take 0.5 tablets  (2.5 mg total) by mouth daily. 15 tablet 3   Continuous Glucose Sensor (FREESTYLE LIBRE 14 DAY SENSOR) MISC SMARTSIG:1 Topical Every 2 Weeks     dapagliflozin  propanediol (FARXIGA ) 10 MG TABS tablet Take 1 tablet (10 mg total) by mouth daily before breakfast. 90 tablet 3   fluticasone  (FLONASE ) 50 MCG/ACT nasal spray Place 2 sprays into both nostrils daily. 16 g 6   fluticasone -salmeterol (ADVAIR HFA) 230-21 MCG/ACT inhaler Inhale 2 puffs into the lungs 2 (two) times daily.     furosemide  (LASIX ) 40 MG tablet Take 0.5 tablets (20 mg total) by mouth daily. 90 tablet 3   glipiZIDE  (GLUCOTROL ) 5 MG tablet TAKE 1-2 TABLETS (5-10 MG TOTAL) BY MOUTH 2 (TWO) TIMES DAILY BEFORE A MEAL. (Patient taking differently: Take 5-10 mg by mouth as needed.) 180 tablet 1   HYDROcodone -acetaminophen  (NORCO) 7.5-325 MG tablet Take 1 tablet by mouth every 6 (six) hours as needed for severe pain (pain score 7-10) (for chronic joint pain, neck pain). 90 tablet 0   ipratropium (ATROVENT ) 0.02 % nebulizer solution TAKE 2.5 ML (0.5 MG TOTAL) BY NEBULIZATION 4 TIMES A DAY (Patient taking differently: Take 0.5 mg by nebulization as needed for shortness of breath or wheezing.) 125 mL 3   Lactobacillus (ACIDOPHILUS) 100 MG CAPS Take 1 capsule (100 mg total) by mouth 3 (three) times daily with meals. 126 capsule 0   levocetirizine (XYZAL ) 5 MG tablet Take 1 tablet (5 mg total) by mouth every evening. 30 tablet 2   LORazepam  (ATIVAN ) 1 MG  tablet TAKE 1/2 TO 1 TABLET BY MOUTH NIGHTLY AS NEEDED. 30 tablet 0   metFORMIN  (GLUCOPHAGE ) 500 MG tablet TAKE 1 TABLET BY MOUTH TWICE A DAY WITH FOOD 180 tablet 1   mirtazapine  (REMERON ) 7.5 MG tablet TAKE 1-2 TABLETS (7.5-15 MG TOTAL) BY MOUTH AT BEDTIME. (Patient taking differently: Take 15 mg by mouth at bedtime.) 180 tablet 2   montelukast  (SINGULAIR ) 10 MG tablet Take 1 tablet (10 mg total) by mouth at bedtime. 100 tablet 3   nitroGLYCERIN  (NITROSTAT ) 0.4 MG SL tablet Place 1 tablet (0.4 mg  total) under the tongue every 5 (five) minutes x 3 doses as needed for chest pain. 75 tablet 1   nystatin  (MYCOSTATIN ) 100000 UNIT/ML suspension Take 5 mLs (500,000 Units total) by mouth 4 (four) times daily. Swish and spit.  Use for 10 days (Patient taking differently: Take 5 mLs by mouth as needed. Swish and spit.  Use for 10 days) 200 mL 0   potassium chloride  (KLOR-CON  M) 10 MEQ tablet Take 10 mEq by mouth 2 (two) times daily.     rosuvastatin  (CRESTOR ) 10 MG tablet TAKE 1 TABLET ON MONDAY, WEDNESDAY & FRIDAY. 45 tablet 3   vitamin E 180 MG (400 UNITS) capsule Take 400 Units by mouth daily.     No current facility-administered medications for this encounter.    Atrial Fibrillation Management history:  Previous antiarrhythmic drugs: none Previous cardioversions: none Previous ablations: none Anticoagulation history: Eliquis    ROS- All systems are reviewed and negative except as per the HPI above.  Physical Exam: BP 98/70   Pulse 71   Ht 5' 9 (1.753 m)   Wt 62 kg   BMI 20.17 kg/m   GEN- The patient is well appearing, alert and oriented x 3 today.   Neck - no JVD or carotid bruit noted Lungs- Clear to ausculation bilaterally, normal work of breathing Heart- Regular rate (V paced), no murmurs, rubs or gallops, PMI not laterally displaced Extremities- no clubbing, cyanosis, or edema Skin - no rash or ecchymosis noted   EKG today demonstrates  EKG Interpretation Date/Time:  Monday December 31 2024 14:41:01 EST Ventricular Rate:  71 PR Interval:    QRS Duration:  158 QT Interval:  474 QTC Calculation: 515 R Axis:   191  Text Interpretation: Ventricular-paced rhythm with premature ventricular or aberrantly conducted complexes Abnormal ECG When compared with ECG of 10-Dec-2024 11:46, No significant change was found Confirmed by Terra Pac [phone]) on 12/31/2024 3:06:37 PM     Echo 08/23/24 demonstrated   1. Left ventricular ejection fraction, by estimation, is 25 to 30%.  The  left ventricle has severely decreased function. The left ventricle  demonstrates global hypokinesis. There is mild concentric left ventricular  hypertrophy. Indeterminate diastolic  filling due to E-A fusion.   2. Right ventricular systolic function is moderately reduced. The right  ventricular size is moderately enlarged. There is mildly elevated  pulmonary artery systolic pressure. The estimated right ventricular  systolic pressure is 40.9 mmHg.   3. Left atrial size was mildly dilated.   4. Right atrial size was mildly dilated.   5. The mitral valve is grossly normal. Mild mitral valve regurgitation.  No evidence of mitral stenosis.   6. The aortic valve is tricuspid. Aortic valve regurgitation is not  visualized. No aortic stenosis is present.   7. The inferior vena cava is normal in size with greater than 50%  respiratory variability, suggesting right atrial pressure of 3 mmHg.  ASSESSMENT & PLAN CHA2DS2-VASc Score = 6  The patient's score is based upon: CHF History: 1 HTN History: 1 Diabetes History: 1 Stroke History: 0 Vascular Disease History: 1 Age Score: 2 Gender Score: 0       ASSESSMENT AND PLAN: Persistent Atrial flutter (ICD10:  I48.19) The patient's CHA2DS2-VASc score is 6, indicating a 9.7% annual risk of stroke.    Patient is currently in V paced rhythm. Continue bisoprolol  2.5 mg daily. We discussed the procedure cardioversion to try to convert to NSR. We discussed the risks vs benefits of this procedure and how ultimately we cannot predict whether a patient will have early return of arrhythmia post procedure. After discussion, the patient wishes to proceed with cardioversion. Labs drawn on 12/25/24 and Bmet rechecked today showed normal potassium. He will have to hold Farxiga  3 days prior to cardioversion. No missed doses of DOAC. We did briefly discuss rhythm control options such as AAD therapy or ablation if he has ERAF.  Informed Consent   Shared  Decision Making/Informed Consent The risks (stroke, cardiac arrhythmias rarely resulting in the need for a temporary or permanent pacemaker, skin irritation or burns and complications associated with conscious sedation including aspiration, arrhythmia, respiratory failure and death), benefits (restoration of normal sinus rhythm) and alternatives of a direct current cardioversion were explained in detail to Mr. Finkbiner and he agrees to proceed.       Secondary Hypercoagulable State (ICD10:  D68.69) The patient is at significant risk for stroke/thromboembolism based upon his CHA2DS2-VASc Score of 6.  Continue Apixaban  (Eliquis ).  No missed doses of Eliquis  since last office visit.     Follow up 2 weeks after DCCV.    Terra Pac, [facility]  [facility] Clinic [address] [place], [place] [address] [phone]  "

## 2024-12-31 NOTE — Patient Instructions (Addendum)
 Hold dapagliflozin  propanediol (FARXIGA ) starting 01/01/25 resume after procedure   Do not take diabetic meds the morning of procedure   Call us  to schedule cardioversion and set up 2 week post appointment    Cardioversion scheduled for:   - Arrive at the Main Entrance A of [facility] ([address]  and check in with ADMITTING at   - Do not eat or drink anything after midnight the night prior to your procedure.   - Take all your morning medication (except diabetic medications) with a sip of water  prior to arrival.  - Do NOT miss any doses of your blood thinner - if you should miss a dose or take a dose more than 4 hours late -- please notify our office immediately.  - You will not be able to drive home after your procedure. Please ensure you have a responsible adult to drive you home. You will need someone with you for 24 hours post procedure.     - Expect to be in the procedural area approximately 2 hours.   - If you feel as if you go back into normal rhythm prior to scheduled cardioversion, please notify our office immediately.   If your procedure is canceled in the cardioversion suite you will be charged a cancellation fee.      Hold below medications 72 hours prior to scheduled procedure/anesthesia. Restart medication on the following day after scheduled procedure/anesthesia Dapagliflozin  (Farxiga )   For those patients who have a scheduled procedure/anesthesia on the same day of the week as their dose, hold the medication on the day of surgery.  They can take their scheduled dose the week before.  **Patients on the above medications scheduled for elective procedures that have not held the medication for the appropriate amount of time are at risk of cancellation or change in the anesthetic plan.

## 2024-12-31 NOTE — Addendum Note (Signed)
 Encounter addended by: Janel Nancy SAUNDERS, RN on: 12/31/2024 4:02 PM  Actions taken: Order list changed, Diagnosis association updated

## 2025-01-01 ENCOUNTER — Ambulatory Visit (HOSPITAL_COMMUNITY): Payer: Self-pay | Admitting: Family Medicine

## 2025-01-01 ENCOUNTER — Encounter: Payer: Self-pay | Admitting: Adult Health

## 2025-01-01 ENCOUNTER — Ambulatory Visit (INDEPENDENT_AMBULATORY_CARE_PROVIDER_SITE_OTHER)

## 2025-01-01 ENCOUNTER — Ambulatory Visit: Admitting: Adult Health

## 2025-01-01 VITALS — BP 98/54 | HR 70 | Temp 98.0°F | Ht 69.5 in | Wt 137.4 lb

## 2025-01-01 DIAGNOSIS — J45909 Unspecified asthma, uncomplicated: Secondary | ICD-10-CM

## 2025-01-01 DIAGNOSIS — J90 Pleural effusion, not elsewhere classified: Secondary | ICD-10-CM

## 2025-01-01 DIAGNOSIS — J153 Pneumonia due to streptococcus, group B: Secondary | ICD-10-CM

## 2025-01-01 DIAGNOSIS — Z87891 Personal history of nicotine dependence: Secondary | ICD-10-CM

## 2025-01-01 DIAGNOSIS — R1310 Dysphagia, unspecified: Secondary | ICD-10-CM

## 2025-01-01 DIAGNOSIS — R918 Other nonspecific abnormal finding of lung field: Secondary | ICD-10-CM

## 2025-01-01 DIAGNOSIS — Z8679 Personal history of other diseases of the circulatory system: Secondary | ICD-10-CM

## 2025-01-01 DIAGNOSIS — J189 Pneumonia, unspecified organism: Secondary | ICD-10-CM | POA: Diagnosis not present

## 2025-01-01 DIAGNOSIS — J869 Pyothorax without fistula: Secondary | ICD-10-CM | POA: Diagnosis not present

## 2025-01-01 DIAGNOSIS — J4540 Moderate persistent asthma, uncomplicated: Secondary | ICD-10-CM

## 2025-01-01 DIAGNOSIS — J479 Bronchiectasis, uncomplicated: Secondary | ICD-10-CM

## 2025-01-01 DIAGNOSIS — E43 Unspecified severe protein-calorie malnutrition: Secondary | ICD-10-CM

## 2025-01-01 NOTE — Patient Instructions (Addendum)
 Continue on Advair 2 puffs Twice daily, rinse after use.  Continue on Singulair  daily  Flonase  As needed   Albuterol  inhaler or neb As needed    Aspiration precautions   High protein diet.   Activity as tolerated   Chest xray today.   Set up CT chest in 4 weeks   Follow up with Dr. Geronimo in 6 weeks and As needed  -with PFT  Please contact office for sooner follow up if symptoms do not improve or worsen or seek emergency care

## 2025-01-01 NOTE — Progress Notes (Signed)
 "  @Patient  ID: Jeffery [name], male    DOB: 24-Oct-1941, 84 y.o.   MRN: [id number]  Chief Complaint  Patient presents with   Medical Management of Chronic Issues    CT f/u    Referring provider: Geofm Glade PARAS, MD  HPI: 84 year old male followed for asthma seen for pulmonary consult August 2025 to reestablish pulmonary care.  Has abnormal CT chest with ground glass opacities Medical history significant for congestive heart failure Retired SCIENTIST, CLINICAL (HISTOCOMPATIBILITY AND IMMUNOGENETICS)   TEST/EVENTS : Reviewed 01/01/2025  CT chest November 12, 2024 diffuse pneumonia throughout left lung, large left pleural effusion with areas of hyperdense fluid suggestive of empyema, debris in the left mainstem bronchus and occluding the left lower lobe bronchus, punctate hyperdensities in the left lower lobe may be due to aspiration  CT chest November 18, 2024 new small right pleural effusion, small pneumothorax left lung base, left pleural effusion resolved, consolidation in the left upper lobe and left lower lobe, hazy ground glass opacities right upper and right lower, mediastinal lymphadenopathy  HRCT chest 01/2019-Mild bronchiectasis , waxing and waning tree in bud opacities , LLL nodular consolidation   Discussed the use of AI scribe software for clinical note transcription with the patient, who gave verbal consent to proceed.  History of Present Illness Jeffery [name] is an 84 year old male who presents for a post-hospital follow-up after severe pneumonia and empyema with hospitalization in Dec 2025.   He was hospitalized for severe multifocal pneumonia and left-sided empyema, which required drainage and a chest tube.  Hospitalized December 2025 with a left upper lobe pneumonia, complicated by empyema.  Underwent thoracentesis, chest tube placement for drainage. Pleural cultures showed pansensitive Streptococcus.  He was treated with aggressive IV antibiotics and discharged on prolonged Augmentin . Has completed full course of  Augmentin . Developed diarrhea initially. Diarrhea has resolved, and he is now having solid stools. No bloody stools or fevers.  Since discharge patient says he is feeling so much better.  Appetite has returned he is actually able to eat now.  Has more energy.  Has doing physical therapy at home.  He experienced significant weight loss, approximately 40 pounds, starting last couple of year. attributed to a loss of appetite and reduced physical activity.  Was seen by speech therapy during hospitalization and noted to have swallowing issues.  Concern for possible underlying aspiration. Patient says since discharge he is eating much better.  We discussed aspiration precautions in detail.   He has a history of asthma and is currently using Advair twice daily, albuterol  as needed, and montelukast . His asthma symptoms have improved significantly.  Has any cough or wheezing.  He experiences chronic neck and back pain, for which he takes hydrocodone , usually one to two tablets per day, primarily at bedtime. He also takes Ativan  and Remeron  at night, initially prescribed for depression but also aid in sleep and appetite.  We discussed the potential for sedating properties of these medications.  He has congestive heart failure and atrial fibrillation. He is scheduled for a cardioversion in near future      Allergies[1]  Immunization History  Administered Date(s) Administered   Fluad Quad(high Dose 65+) 08/01/2019, 08/08/2021, 11/20/2022   INFLUENZA, HIGH DOSE SEASONAL PF 08/31/2017, 08/08/2020, 09/04/2021, 08/18/2023, 08/21/2024   Influenza Split 08/30/2012   Influenza,inj,Quad PF,6+ Mos 10/09/2013, 09/12/2015   Influenza,trivalent, recombinat, inj, PF 08/21/2018   Influenza-Unspecified 09/29/2014, 09/02/2017, 08/30/2019, 08/08/2020   Moderna Sars-Covid-2 Vaccination 09/12/2018, 01/25/2020, 03/03/2020   PFIZER Comirnaty(Gray Top)Covid-19 Tri-Sucrose Vaccine  05/26/2021   Pfizer Covid-19 Database Administrator 56yrs & up 09/07/2021   Pfizer(Comirnaty)Fall Seasonal Vaccine 12 years and older 08/22/2024   Pneumococcal Conjugate-13 03/04/2016   Pneumococcal Polysaccharide-23 04/12/2017   Respiratory Syncytial Virus Vaccine,Recomb Aduvanted(Arexvy) 01/18/2023   Td 05/13/2008   Tdap 07/02/2020    Past Medical History:  Diagnosis Date   Arthritis    thumbs (07/25/2018)   Asthma    CHF (congestive heart failure) (HCC)    Chronic bronchitis (HCC)    Environmental allergies    GERD (gastroesophageal reflux disease)    Headache    seasonal; w/environmental allergies (07/25/2018)   History of blood transfusion    when I had laminectomy (07/25/2018)   HTN (hypertension)    Hyperlipidemia    LBBB (left bundle branch block) 1999   Metatarsal bone fracture 03/07/2018   Myocardial infarction [facility])    was told I've had an old MI; probably in the 1990s (07/25/2018)   Pneumonia    as a child, age 84; viral pneumonia 3 times in the last 10 years (07/25/2018)   Presence of permanent cardiac pacemaker 07/25/2018   Seasonal allergies    Sleep apnea    wife says I do (07/25/2018)   Type II diabetes mellitus (HCC)     Tobacco History: Tobacco Use History[2] Counseling given: Not Answered Tobacco comments: Former smoker 12/31/24   Outpatient Medications Prior to Visit  Medication Sig Dispense Refill   albuterol  (PROVENTIL ) (2.5 MG/3ML) 0.083% nebulizer solution Take 3 mLs (2.5 mg total) by nebulization every 6 (six) hours as needed for wheezing or shortness of breath. 150 mL 5   albuterol  (VENTOLIN  HFA) 108 (90 Base) MCG/ACT inhaler TAKE 2 PUFFS BY MOUTH EVERY 6 HOURS AS NEEDED FOR WHEEZE OR SHORTNESS OF BREATH 6.7 each 11   apixaban  (ELIQUIS ) 5 MG TABS tablet Take 1 tablet (5 mg total) by mouth 2 (two) times daily. 180 tablet 3   bisoprolol  (ZEBETA ) 5 MG tablet Take 0.5 tablets (2.5 mg total) by mouth daily. 15 tablet 3   Continuous Glucose Sensor (FREESTYLE LIBRE 14 DAY  SENSOR) MISC SMARTSIG:1 Topical Every 2 Weeks     dapagliflozin  propanediol (FARXIGA ) 10 MG TABS tablet Take 1 tablet (10 mg total) by mouth daily before breakfast. 90 tablet 3   fluticasone  (FLONASE ) 50 MCG/ACT nasal spray Place 2 sprays into both nostrils daily. 16 g 6   fluticasone -salmeterol (ADVAIR HFA) 230-21 MCG/ACT inhaler Inhale 2 puffs into the lungs 2 (two) times daily.     furosemide  (LASIX ) 40 MG tablet Take 0.5 tablets (20 mg total) by mouth daily. 90 tablet 3   glipiZIDE  (GLUCOTROL ) 5 MG tablet TAKE 1-2 TABLETS (5-10 MG TOTAL) BY MOUTH 2 (TWO) TIMES DAILY BEFORE A MEAL. (Patient taking differently: Take 5-10 mg by mouth as needed.) 180 tablet 1   HYDROcodone -acetaminophen  (NORCO) 7.5-325 MG tablet Take 1 tablet by mouth every 6 (six) hours as needed for severe pain (pain score 7-10) (for chronic joint pain, neck pain). 90 tablet 0   ipratropium (ATROVENT ) 0.02 % nebulizer solution TAKE 2.5 ML (0.5 MG TOTAL) BY NEBULIZATION 4 TIMES A DAY (Patient taking differently: Take 0.5 mg by nebulization as needed for shortness of breath or wheezing.) 125 mL 3   levocetirizine (XYZAL ) 5 MG tablet Take 1 tablet (5 mg total) by mouth every evening. 30 tablet 2   LORazepam  (ATIVAN ) 1 MG tablet TAKE 1/2 TO 1 TABLET BY MOUTH NIGHTLY AS NEEDED. 30 tablet 0   metFORMIN  (GLUCOPHAGE ) 500  MG tablet TAKE 1 TABLET BY MOUTH TWICE A DAY WITH FOOD 180 tablet 1   mirtazapine  (REMERON ) 7.5 MG tablet TAKE 1-2 TABLETS (7.5-15 MG TOTAL) BY MOUTH AT BEDTIME. 180 tablet 2   montelukast  (SINGULAIR ) 10 MG tablet Take 1 tablet (10 mg total) by mouth at bedtime. 100 tablet 3   nitroGLYCERIN  (NITROSTAT ) 0.4 MG SL tablet Place 1 tablet (0.4 mg total) under the tongue every 5 (five) minutes x 3 doses as needed for chest pain. 75 tablet 1   nystatin  (MYCOSTATIN ) 100000 UNIT/ML suspension Take 5 mLs (500,000 Units total) by mouth 4 (four) times daily. Swish and spit.  Use for 10 days (Patient taking differently: Take 5 mLs by  mouth as needed. Swish and spit.  Use for 10 days) 200 mL 0   rosuvastatin  (CRESTOR ) 10 MG tablet TAKE 1 TABLET ON MONDAY, WEDNESDAY & FRIDAY. 45 tablet 3   potassium chloride  (KLOR-CON  M) 10 MEQ tablet Take 10 mEq by mouth 2 (two) times daily. (Patient not taking: Reported on 01/01/2025)     vitamin E 180 MG (400 UNITS) capsule Take 400 Units by mouth daily. (Patient not taking: Reported on 01/01/2025)     No facility-administered medications prior to visit.     Review of Systems:   Constitutional:   No  weight loss, night sweats,  Fevers, chills, +fatigue, or  lassitude.  HEENT:   No headaches,  Difficulty swallowing,  Tooth/dental problems, or  Sore throat,                No sneezing, itching, ear ache, nasal congestion, post nasal drip,   CV:  No chest pain,  Orthopnea, PND, swelling in lower extremities, anasarca, dizziness, palpitations, syncope.   GI  No heartburn, indigestion, abdominal pain, nausea, vomiting, diarrhea, change in bowel habits, loss of appetite, bloody stools.   Resp: .  No chest wall deformity  Skin: no rash or lesions.  GU: no dysuria, change in color of urine, no urgency or frequency.  No flank pain, no hematuria   MS:  No joint pain or swelling.  No decreased range of motion.  No back pain.    Physical Exam  BP (!) 98/54   Pulse 70   Temp 98 F (36.7 C)   Ht 5' 9.5 (1.765 m)   Wt 137 lb 6.4 oz (62.3 kg)   SpO2 99% Comment: RA  BMI 20.00 kg/m   GEN: A/Ox3; pleasant , NAD, thin and frail   HEENT:  [place]/AT,   , NOSE-clear, THROAT-clear, no lesions, no postnasal drip or exudate noted.   NECK:  Supple w/ fair ROM; no JVD; normal carotid impulses w/o bruits; no thyromegaly or nodules palpated; no lymphadenopathy.    RESP coarse rhonchi left greater than right . no accessory muscle use, no dullness to percussion  CARD:  RRR, no m/r/g, no peripheral edema, pulses intact, no cyanosis or clubbing.  GI:   Soft & nt; nml bowel sounds; no organomegaly  or masses detected.   Musco: Warm bil, no deformities or joint swelling noted.   Neuro: alert, no focal deficits noted.    Skin: Warm, no lesions or rashes    Lab Results:Reviewed 01/01/2025   CBC    Component Value Date/Time   WBC 11.8 (H) 12/25/2024 1250   RBC 5.04 12/25/2024 1250   HGB 13.3 12/25/2024 1250   HGB 14.4 07/13/2018 1027   HCT 42.2 12/25/2024 1250   HCT 43.1 07/13/2018 1027   PLT 442 (H)  12/25/2024 1250   PLT 298 07/13/2018 1027   MCV 83.7 12/25/2024 1250   MCV 86 07/13/2018 1027   MCH 26.4 12/25/2024 1250   MCHC 31.5 12/25/2024 1250   RDW 18.0 (H) 12/25/2024 1250   RDW 14.7 07/13/2018 1027   LYMPHSABS 0.5 (L) 11/15/2024 1758   LYMPHSABS 0.9 07/13/2018 1027   MONOABS 0.9 11/15/2024 1758   EOSABS 0.0 11/15/2024 1758   EOSABS 0.2 07/13/2018 1027   BASOSABS 0.0 11/15/2024 1758   BASOSABS 0.0 07/13/2018 1027    BMET    Component Value Date/Time   NA 139 12/31/2024 1110   NA 138 08/02/2024 1641   K 4.7 12/31/2024 1110   CL 101 12/31/2024 1110   CO2 29 12/31/2024 1110   GLUCOSE 86 12/31/2024 1110   BUN 39 (H) 12/31/2024 1110   BUN 21 08/02/2024 1641   CREATININE 0.81 12/31/2024 1110   CREATININE 1.13 08/20/2020 1144   CALCIUM  9.8 12/31/2024 1110   GFRNONAA >60 12/31/2024 1110   GFRNONAA 62 08/20/2020 1144   GFRAA 72 08/20/2020 1144    BNP    Component Value Date/Time   BNP 524.6 (H) 11/12/2024 0241    ProBNP    Component Value Date/Time   PROBNP 2,823.0 (H) 12/25/2024 1250   PROBNP 488.0 (H) 09/10/2024 1258    Imaging: CUP PACEART INCLINIC DEVICE CHECK Result Date: 12/10/2024 Patient seen in device clinic for programming changes per apt note. Changes made to session: 1. RA sensitivity programmed from 1.0 mV to 0.5 mV. 2. RA CC programmed from ON to MONITOR. 3. RA Output programmed at a fixed output of 2.0V. RV sensitivity programmed from 5.30mV to 4.0 mV.Anette Shutter, BSN, RN   Administration History     None           No  data to display          Lab Results  Component Value Date   NITRICOXIDE 61 08/03/2024        No data to display              Assessment & Plan:   Assessment and Plan Assessment & Plan Left-sided empyema and pneumonia -clinically improving with recent hospitalization and prolonged antibiotic course. Severe left-sided pneumonia and empyema were previously treated with antibiotics and chest tube drainage. He completed extended course of Augmentin .  Radiographical clearance may take several weeks to months.  . A chest x-ray is ordered for today, a CT scan will be ordered in four weeks, and a follow-up  is scheduled in six weeks.  Asthma  -moderate persistent appears stable. Symptoms were exacerbated but are now significantly improved with Advair, albuterol , and montelukast . Continue the current asthma management regimen. Check PFT on return   Bronchiectasis  -appears stable. Previous scans showed tree-in-bud opacities and low-grade opacities in the bases, His clinical status has improved. Continue to monitor with follow-up imaging as needed. Continue with pulmonary clearance .   Dysphagia with aspiration risk   Swallowing abnormalities with aspiration risk   He reports improvement in swallowing and appetite and is currently self-monitoring and following precautions. Continue aspiration precautions, consider outpatient speech therapy, maintain an upright position after meals, and avoid eating 2-3 hours before lying down.  Plan  . Patient Instructions  Continue on Advair 2 puffs Twice daily, rinse after use.  Continue on Singulair  daily  Flonase  As needed   Albuterol  inhaler or neb As needed    Aspiration precautions   High protein diet.   Activity  as tolerated   Chest xray today.   Set up CT chest in 4 weeks   Follow up with Dr. Geronimo in 6 weeks and As needed  -with PFT  Please contact office for sooner follow up if symptoms do not improve or worsen or seek  emergency care               Crispin Vogel, NP 01/01/2025  I spent  46  minutes dedicated to the care of this patient on the date of this encounter to include pre-visit review of records, face-to-face time with the patient discussing conditions above, post visit ordering of testing, clinical documentation with the electronic health record, making appropriate referrals as documented, and communicating necessary findings to members of the patients care team.      [1]  Allergies Allergen Reactions   Contrast Media [Iodinated Contrast Media] Other (See Comments)    Redness and warm sensation, this reaction was noted on 02/27/13 during a Cardiac MRI per pt.  Pt sts he had erythema on his head, chest, and shoulders.  Pt had a pacemaker placed August 2019 and was pre-medicated for the dye and had no allergic reaction at that time. -Carissa Mozingo B.S. RT(R)(CT)  [2]  Social History Tobacco Use  Smoking Status Former   Current packs/day: 0.00   Average packs/day: 2.0 packs/day for 2.0 years (4.0 ttl pk-yrs)   Types: Cigarettes   Start date: 12/13/1961   Quit date: 12/14/1963   Years since quitting: 61.0  Smokeless Tobacco Never  Tobacco Comments   Former smoker 12/31/24   "

## 2025-01-03 ENCOUNTER — Ambulatory Visit: Payer: Self-pay | Admitting: Adult Health

## 2025-01-04 ENCOUNTER — Encounter (HOSPITAL_COMMUNITY): Payer: Self-pay | Admitting: Cardiovascular Disease

## 2025-01-04 ENCOUNTER — Ambulatory Visit (HOSPITAL_COMMUNITY)
Admission: RE | Admit: 2025-01-04 | Discharge: 2025-01-04 | Disposition: A | Discharge status: Home / Self Care | Attending: Cardiovascular Disease | Admitting: Cardiovascular Disease

## 2025-01-04 ENCOUNTER — Other Ambulatory Visit: Payer: Self-pay

## 2025-01-04 ENCOUNTER — Ambulatory Visit (HOSPITAL_COMMUNITY): Admitting: Anesthesiology

## 2025-01-04 ENCOUNTER — Encounter (HOSPITAL_COMMUNITY)
Admission: RE | Disposition: A | Discharge status: Home / Self Care | Payer: Self-pay | Source: Home / Self Care | Attending: Cardiovascular Disease

## 2025-01-04 DIAGNOSIS — I4891 Unspecified atrial fibrillation: Secondary | ICD-10-CM | POA: Diagnosis not present

## 2025-01-04 DIAGNOSIS — Z79899 Other long term (current) drug therapy: Secondary | ICD-10-CM | POA: Diagnosis not present

## 2025-01-04 DIAGNOSIS — Z87891 Personal history of nicotine dependence: Secondary | ICD-10-CM | POA: Diagnosis not present

## 2025-01-04 DIAGNOSIS — I4819 Other persistent atrial fibrillation: Secondary | ICD-10-CM | POA: Insufficient documentation

## 2025-01-04 DIAGNOSIS — E119 Type 2 diabetes mellitus without complications: Secondary | ICD-10-CM | POA: Insufficient documentation

## 2025-01-04 DIAGNOSIS — E785 Hyperlipidemia, unspecified: Secondary | ICD-10-CM | POA: Diagnosis not present

## 2025-01-04 DIAGNOSIS — D6869 Other thrombophilia: Secondary | ICD-10-CM | POA: Diagnosis not present

## 2025-01-04 DIAGNOSIS — I459 Conduction disorder, unspecified: Secondary | ICD-10-CM | POA: Insufficient documentation

## 2025-01-04 DIAGNOSIS — I2510 Atherosclerotic heart disease of native coronary artery without angina pectoris: Secondary | ICD-10-CM | POA: Insufficient documentation

## 2025-01-04 DIAGNOSIS — I110 Hypertensive heart disease with heart failure: Secondary | ICD-10-CM | POA: Diagnosis not present

## 2025-01-04 DIAGNOSIS — Z7901 Long term (current) use of anticoagulants: Secondary | ICD-10-CM | POA: Diagnosis not present

## 2025-01-04 DIAGNOSIS — Z7984 Long term (current) use of oral hypoglycemic drugs: Secondary | ICD-10-CM | POA: Insufficient documentation

## 2025-01-04 DIAGNOSIS — I4892 Unspecified atrial flutter: Secondary | ICD-10-CM | POA: Insufficient documentation

## 2025-01-04 DIAGNOSIS — I5022 Chronic systolic (congestive) heart failure: Secondary | ICD-10-CM | POA: Diagnosis not present

## 2025-01-04 DIAGNOSIS — Z950 Presence of cardiac pacemaker: Secondary | ICD-10-CM | POA: Insufficient documentation

## 2025-01-04 LAB — GLUCOSE, CAPILLARY: Glucose-Capillary: 86 mg/dL (ref 70–99)

## 2025-01-04 MED ORDER — LIDOCAINE 2% (20 MG/ML) 5 ML SYRINGE
INTRAMUSCULAR | Status: DC | PRN
  Administered 2025-01-04: 60 mg via INTRAVENOUS

## 2025-01-04 MED ORDER — PROPOFOL 10 MG/ML IV BOLUS
INTRAVENOUS | Status: DC | PRN
  Administered 2025-01-04: 50 mg via INTRAVENOUS

## 2025-01-04 MED ORDER — SODIUM CHLORIDE 0.9 % IV SOLN: INTRAVENOUS | Status: DC

## 2025-01-04 NOTE — Op Note (Signed)
 Procedure: Electrical Cardioversion Indications:  Atrial Flutter  Procedure Details:  Consent: Risks of procedure as well as the alternatives and risks of each were explained to the (patient/caregiver).  Consent for procedure obtained.  Time Out: Verified patient identification, verified procedure, site/side was marked, verified correct patient position, special equipment/implants available, medications/allergies/relevent history reviewed, required imaging and test results available.  Performed  Patient placed on cardiac monitor, pulse oximetry, supplemental oxygen as necessary.  Sedation given: propofol IV, Dr. Maryclare Pacer pads placed anterior and posterior chest.  Cardioverted 1 time(s).  Cardioversion with synchronized biphasic 200J shock.  Evaluation: Findings: Post procedure EKG shows: A sensed, BiV paced  Complications: None Patient did tolerate procedure well.  Pacemaker interrogation before and after DCCV showed normal generator and lead parameters. Presenting rhythm was AFlutter with 2:1 AV block, native conduction. Before DCCV, attempted overdrive pacing was unsuccessful (Flutter CL 246 ms, OD burst pacing at 230, 210, 200, 190, 180 ms appeared to entrain the flutter circuit, but the arrhythmia continued unabated.). Estimated generator longevity 1.2 years , all 3 lead capture threshold/sensing/impedance unchanged after the DC shock.  Time Spent Directly with the Patient:  30 minutes   Jeffery [name] 01/04/2025, 10:47 AM

## 2025-01-04 NOTE — Anesthesia Preprocedure Evaluation (Signed)
"                                    Anesthesia Evaluation  Patient identified by MRN, date of birth, ID band Patient awake    Reviewed: Allergy & Precautions, H&P , NPO status , Patient's Chart, lab work & pertinent test results  Airway Mallampati: II   Neck ROM: full    Dental   Pulmonary asthma , sleep apnea , former smoker   breath sounds clear to auscultation       Cardiovascular hypertension, + CAD, + Past MI and +CHF  + dysrhythmias Atrial Fibrillation + pacemaker  Rhythm:irregular Rate:Normal     Neuro/Psych  Headaches    GI/Hepatic ,GERD  ,,  Endo/Other  diabetes, Type 2    Renal/GU      Musculoskeletal  (+) Arthritis ,    Abdominal   Peds  Hematology   Anesthesia Other Findings   Reproductive/Obstetrics                              Anesthesia Physical Anesthesia Plan  ASA: 3  Anesthesia Plan: General   Post-op Pain Management:    Induction: Intravenous  PONV Risk Score and Plan: 2 and Propofol  infusion and Treatment may vary due to age or medical condition  Airway Management Planned: Nasal Cannula  Additional Equipment:   Intra-op Plan:   Post-operative Plan:   Informed Consent: I have reviewed the patients History and Physical, chart, labs and discussed the procedure including the risks, benefits and alternatives for the proposed anesthesia with the patient or authorized representative who has indicated his/her understanding and acceptance.     Dental advisory given  Plan Discussed with: CRNA, Anesthesiologist and Surgeon  Anesthesia Plan Comments:         Anesthesia Quick Evaluation  "

## 2025-01-04 NOTE — Interval H&P Note (Signed)
 History and Physical Interval Note:  01/04/2025 10:02 AM  Jeffery [name]  has presented today for surgery, with the diagnosis of AFIB.  The various methods of treatment have been discussed with the patient and family. After consideration of risks, benefits and other options for treatment, the patient has consented to  Procedures: CARDIOVERSION (N/A) as a surgical intervention.  The patient's history has been reviewed, patient examined, no change in status, stable for surgery.  I have reviewed the patient's chart and labs.  Questions were answered to the patient's satisfaction.     [name]

## 2025-01-04 NOTE — Transfer of Care (Signed)
 Immediate Anesthesia Transfer of Care Note  Patient: Jeffery [name] Desai  Procedure(s) Performed: CARDIOVERSION  Patient Location: Cath Lab  Anesthesia Type:General  Level of Consciousness: awake, alert , and oriented  Airway & Oxygen Therapy: Patient Spontanous Breathing and Patient connected to nasal cannula oxygen  Post-op Assessment: Report given to RN and Post -op Vital signs reviewed and stable  Post vital signs: Reviewed and stable  Last Vitals:  Vitals Value Taken Time  BP 94/61 1049  Temp 36 1049  Pulse 84 1049  Resp 16 1049  SpO2 100 1049    Last Pain:  Vitals:   01/04/25 1015  TempSrc:   PainSc: 0-No pain         Complications: There were no known notable events for this encounter.

## 2025-01-06 ENCOUNTER — Encounter (HOSPITAL_COMMUNITY): Payer: Self-pay | Admitting: Cardiovascular Disease

## 2025-01-07 NOTE — Anesthesia Postprocedure Evaluation (Signed)
"   Anesthesia Post Note  Patient: Jeffery [name]  Procedure(s) Performed: CARDIOVERSION     Patient location during evaluation: Cath Lab Anesthesia Type: General Level of consciousness: awake and alert Pain management: pain level controlled Vital Signs Assessment: post-procedure vital signs reviewed and stable Respiratory status: spontaneous breathing, nonlabored ventilation, respiratory function stable and patient connected to nasal cannula oxygen Cardiovascular status: blood pressure returned to baseline and stable Postop Assessment: no apparent nausea or vomiting Anesthetic complications: no   There were no known notable events for this encounter.  Last Vitals:  Vitals:   01/04/25 1110 01/04/25 1115  BP: (!) 87/55 (!) 81/61  Pulse: 74 79  Resp: 15 (!) 26  Temp:    SpO2: 96% 97%    Last Pain:  Vitals:   01/04/25 1050  TempSrc:   PainSc: 0-No pain                 Veleria Barnhardt S      "

## 2025-01-15 ENCOUNTER — Telehealth: Payer: Self-pay

## 2025-01-15 ENCOUNTER — Ambulatory Visit (HOSPITAL_COMMUNITY)
Admission: RE | Admit: 2025-01-15 | Discharge: 2025-01-15 | Disposition: A | Source: Ambulatory Visit | Attending: Internal Medicine | Admitting: Internal Medicine

## 2025-01-15 ENCOUNTER — Telehealth (HOSPITAL_COMMUNITY): Payer: Self-pay | Admitting: *Deleted

## 2025-01-15 ENCOUNTER — Encounter (HOSPITAL_COMMUNITY): Payer: Self-pay | Admitting: Internal Medicine

## 2025-01-15 VITALS — BP 106/60 | HR 77 | Ht 69.5 in | Wt 141.4 lb

## 2025-01-15 DIAGNOSIS — D6869 Other thrombophilia: Secondary | ICD-10-CM | POA: Diagnosis not present

## 2025-01-15 DIAGNOSIS — I4892 Unspecified atrial flutter: Secondary | ICD-10-CM | POA: Diagnosis not present

## 2025-01-15 DIAGNOSIS — I4819 Other persistent atrial fibrillation: Secondary | ICD-10-CM

## 2025-01-15 MED ORDER — AMIODARONE HCL 200 MG PO TABS: ORAL_TABLET | ORAL | 0 refills | Status: AC

## 2025-01-15 NOTE — Telephone Encounter (Signed)
 Pt called and reported he is concerned with his HR 116 this morning. He would like to send a transmission from  his device to be reviewed. He will call device clinic to make them aware. Pt denies feeling any complications at this time. Our clinic will continue to watch for results of transmission to review.

## 2025-01-15 NOTE — Telephone Encounter (Signed)
 Remote transmission received and reviewed  Presenting EGM c/w AF/AFL with poorly controlled ventricular rates 100-150's  Patient noted to have had a DCCV on 01/04/25  Patient stated he is feeling more symptomatic than prior to his cardioversion  This RN asked patient for a recent BP ---> 98/66  Message sent to AF team and patient put on AF clinic schedule for 1:30 PM  Appointment time and location confirmed with patient and spouse and successfully read back to this RN  Patient very appreciative for call back and assistance getting appointment from this RN  Routing to Orlando Heinrich, PA-C and Dr. Nancey for awareness

## 2025-01-15 NOTE — Patient Instructions (Signed)
 Start Amiodarone  200mg  twice daily for 1 month then 1 tablet daily

## 2025-01-18 ENCOUNTER — Telehealth: Payer: Self-pay

## 2025-01-18 ENCOUNTER — Ambulatory Visit (HOSPITAL_COMMUNITY): Admitting: Internal Medicine

## 2025-01-18 NOTE — Telephone Encounter (Signed)
 Copied from CRM #[id number]. Topic: Clinical - Request for Lab/Test Order >> Jan 18, 2025  2:59 PM Robinson DEL wrote: Reason for CRM: Jeffery [name] states they received speech therapy order back from Dr. Geofm but it was unsigned, order number [id number]  Jeffery [name] Cella [phone] fax

## 2025-01-25 ENCOUNTER — Encounter

## 2025-01-29 ENCOUNTER — Ambulatory Visit (HOSPITAL_COMMUNITY)

## 2025-02-01 ENCOUNTER — Ambulatory Visit (HOSPITAL_COMMUNITY)

## 2025-02-05 ENCOUNTER — Ambulatory Visit (HOSPITAL_COMMUNITY): Admitting: Internal Medicine

## 2025-04-26 ENCOUNTER — Encounter

## 2025-07-26 ENCOUNTER — Encounter
